# Patient Record
Sex: Male | Born: 1960 | Race: Black or African American | Hispanic: No | Smoking: Former smoker
Health system: Southern US, Community
[De-identification: ages and names within clinical notes are randomized; demographics above are authoritative.]

## PROBLEM LIST (undated history)

## (undated) DIAGNOSIS — T8859XA Other complications of anesthesia, initial encounter: Secondary | ICD-10-CM

## (undated) DIAGNOSIS — E119 Type 2 diabetes mellitus without complications: Secondary | ICD-10-CM

## (undated) DIAGNOSIS — M1990 Unspecified osteoarthritis, unspecified site: Secondary | ICD-10-CM

## (undated) DIAGNOSIS — G4730 Sleep apnea, unspecified: Secondary | ICD-10-CM

## (undated) DIAGNOSIS — G43909 Migraine, unspecified, not intractable, without status migrainosus: Secondary | ICD-10-CM

## (undated) DIAGNOSIS — G459 Transient cerebral ischemic attack, unspecified: Secondary | ICD-10-CM

## (undated) DIAGNOSIS — G8929 Other chronic pain: Secondary | ICD-10-CM

## (undated) DIAGNOSIS — K219 Gastro-esophageal reflux disease without esophagitis: Secondary | ICD-10-CM

## (undated) DIAGNOSIS — I639 Cerebral infarction, unspecified: Secondary | ICD-10-CM

## (undated) DIAGNOSIS — J45909 Unspecified asthma, uncomplicated: Secondary | ICD-10-CM

## (undated) DIAGNOSIS — E0590 Thyrotoxicosis, unspecified without thyrotoxic crisis or storm: Secondary | ICD-10-CM

## (undated) DIAGNOSIS — G40409 Other generalized epilepsy and epileptic syndromes, not intractable, without status epilepticus: Secondary | ICD-10-CM

## (undated) DIAGNOSIS — K259 Gastric ulcer, unspecified as acute or chronic, without hemorrhage or perforation: Secondary | ICD-10-CM

## (undated) DIAGNOSIS — R3914 Feeling of incomplete bladder emptying: Secondary | ICD-10-CM

## (undated) DIAGNOSIS — N50819 Testicular pain, unspecified: Secondary | ICD-10-CM

## (undated) DIAGNOSIS — G40309 Generalized idiopathic epilepsy and epileptic syndromes, not intractable, without status epilepticus: Secondary | ICD-10-CM

## (undated) DIAGNOSIS — R569 Unspecified convulsions: Secondary | ICD-10-CM

## (undated) DIAGNOSIS — F319 Bipolar disorder, unspecified: Secondary | ICD-10-CM

## (undated) DIAGNOSIS — R0600 Dyspnea, unspecified: Secondary | ICD-10-CM

## (undated) DIAGNOSIS — H40009 Preglaucoma, unspecified, unspecified eye: Secondary | ICD-10-CM

## (undated) DIAGNOSIS — H409 Unspecified glaucoma: Secondary | ICD-10-CM

## (undated) DIAGNOSIS — R350 Frequency of micturition: Secondary | ICD-10-CM

## (undated) DIAGNOSIS — I10 Essential (primary) hypertension: Secondary | ICD-10-CM

## (undated) HISTORY — PX: ABDOMINAL SURGERY: SHX537

## (undated) HISTORY — PX: TONSILLECTOMY: SUR1361

## (undated) HISTORY — PX: CARDIAC CATHETERIZATION: SHX172

## (undated) HISTORY — PX: CERVICAL FUSION: SHX112

## (undated) HISTORY — PX: TESTICLE REMOVAL: SHX68

## (undated) HISTORY — PX: SMALL BOWEL REPAIR: SHX6447

## (undated) HISTORY — PX: OTHER SURGICAL HISTORY: SHX169

## (undated) HISTORY — PX: CHOLECYSTECTOMY: SHX55

---

## 2005-02-02 ENCOUNTER — Emergency Department (HOSPITAL_COMMUNITY): Admission: EM | Admit: 2005-02-02 | Discharge: 2005-02-03 | Payer: Self-pay | Admitting: Emergency Medicine

## 2005-12-07 HISTORY — PX: ANTERIOR CERVICAL DECOMP/DISCECTOMY FUSION: SHX1161

## 2013-10-18 ENCOUNTER — Other Ambulatory Visit: Payer: Self-pay | Admitting: Urology

## 2013-11-06 ENCOUNTER — Encounter (HOSPITAL_BASED_OUTPATIENT_CLINIC_OR_DEPARTMENT_OTHER): Payer: Self-pay | Admitting: *Deleted

## 2013-11-08 ENCOUNTER — Encounter (HOSPITAL_BASED_OUTPATIENT_CLINIC_OR_DEPARTMENT_OTHER): Payer: Self-pay | Admitting: *Deleted

## 2013-11-08 NOTE — Progress Notes (Signed)
NPO AFTER MN. ARRIVE AT 1030. NEEDS ISTAT AND EKG.  PT TO CALL BACK TODAY W/ MED LIST. ALSO, TO LET ME KNOW IF HE WILL HAVE HIS SON AT DISCHARGE AND CAREGIVER AT HOME OTHERWISE HE DOES NOT HAVE ANYONE.  IF NOT WILL CALL DR [facility] FOR ADVISE.

## 2013-11-08 NOTE — H&P (Signed)
ctive Problems Problems   1. Lower abdominal pain, right (789.03)  2. Testicular pain (608.9)  History of Present Illness   Nicholas [name] returns today in f/u.  He has  a history of left testicular pain and has had a prior vasectomy and right orchiectomy.  He was given elavil but that didn't help the pain.  Today her reports recurrent condyloma that he treated with a topical agent and they fell off but he is still irritated.  The pain in the testicle is constant.   He says the pain is so severe that he can't stand to void.  He has some urgency but he has to strain to void.  He doesn't feel empty and will have to go back several times at intervals.   He has occasional dysuria.  He had some gross hematuria a week ago.  He has had that in the past.  He has some low back pain but no flank pain.  He had nephritis as a child.   Past Medical History Problems   1. History of Asthma (493.90)  2. History of Bipolar Disorder (296.00)  3. History of Cocaine abuse (305.60)  4. History of Epilepsy (345.90)  5. History of Gastric ulcer (531.90)  6. History of Gross hematuria (599.71)  7. History of condyloma acuminatum (V12.09)  8. History of diabetes mellitus (V12.29)  9. History of heartburn (V12.79)  10. History of hypercholesterolemia (V12.29)  11. History of hypertension (V12.59)  Surgical History Problems   1. History of Circumcision No Clamp/Device/Dorsal Slit Older Than 28 Days  2. History of Neck Surgery  3. History of Radical Orchiectomy Left  4. History of Shoulder Surgery  5. History of Surgery Of Male Genitalia Vasectomy  Current Meds  1. Advair Diskus 100-50 MCG/DOSE Inhalation Aerosol Powder Breath Activated;  Therapy: (Recorded:22Sep2014) to Recorded  2. Amitriptyline HCl - 25 MG Oral Tablet; TAKE 1 TABLET qHS;  Therapy: 22Sep2014 to (Last Rx:22Sep2014)  Requested for: 22Sep2014 Ordered  3. Divalproex Sodium 500 MG Oral Tablet Delayed Release;  Therapy: (Recorded:22Sep2014)  to Recorded  4. Lipitor 40 MG Oral Tablet;  Therapy: 22Sep2014 to Recorded  5. MetFORMIN HCl - 500 MG Oral Tablet;  Therapy: 22Sep2014 to Recorded  6. Topamax TABS;  Therapy: (Recorded:22Sep2014) to Recorded  Allergies Medication   1. Ampicillin CAPS  2. Penicillins  3. Tramadol  Family History Problems   1. Family history of Family Health Status Number Of Children : Brother   2 sons, 1 dtr  2. Family history of Father Deceased At Age 78 : Brother  3. Family history of Kidney Cancer (V16.51) : Mother  4. Family history of Prostate Cancer (V78.46) : Brother  Social History Problems   1. Denied: History of Alcohol Use  2. Caffeine Use   yes  3. Former smoker (V15.82)  4. Tobacco use (305.1)   17 years, quit 2001   Past, family and social history reviewed and updated.   Review of Systems Genitourinary, constitutional, skin, eye, otolaryngeal, hematologic/lymphatic, cardiovascular, pulmonary, endocrine, musculoskeletal, gastrointestinal, neurological and psychiatric system(s) were reviewed and pertinent findings if present are noted.  Genitourinary: hematuria and testicular pain.  Cardiovascular: no chest pain.  Respiratory: no shortness of breath.  Musculoskeletal: back pain.  Neurological: dizziness and tingling (in feet).    Vitals Vital Signs [Data Includes: Last 1 Day]  Recorded: 10Nov2014 08:02AM  Height: 5 ft 7 in Weight: 212 lb  BMI Calculated: 33.2 BSA Calculated: 2.07 Blood Pressure: 117 / 78 Temperature:  97.5 F Heart Rate: 90  Physical Exam Constitutional: Well nourished and well developed . No acute distress.  ENT:. The ears and nose are normal in appearance.  Neck: The appearance of the neck is normal and no neck mass is present.  Pulmonary: No respiratory distress and normal respiratory rhythm and effort.  Cardiovascular: Heart rate and rhythm are normal . No peripheral edema.  Abdomen: The abdomen is soft and nontender.  No inguinal hernia is  present on the right.  No inguinal hernia is present on the left.  Rectal: Rectal exam demonstrates normal sphincter tone, no tenderness and no masses. Estimated prostate size is 1+. The prostate has no nodularity and is not tender. The left seminal vesicle is nonpalpable. The right seminal vesicle is nonpalpable. The perineum is normal on inspection.  Genitourinary: Examination of the penis demonstrates no discharge, no masses, no lesions and a normal meatus. The scrotum is without lesions. The left epididymis is tender and found to have a spermatocele, but palpably normal. The right testis is absent. The left testis is atrophic, but non-tender and without masses.  Lymphatics: The femoral and inguinal nodes are not enlarged or tender.  Skin: Normal skin turgor, no visible rash and no visible skin lesions . No condyloma seen.  Neuro/Psych:. Mood and affect are appropriate. Normal sensation of the perineum/perianal region (S3,4,5).    Results/Data  16 Oct 2013 7:49 AM   UA With REFLEX       COLOR YELLOW       APPEARANCE CLEAR       SPECIFIC GRAVITY 1.015       pH 6.5       GLUCOSE > 1000       BILIRUBIN NEG       KETONE NEG       BLOOD NEG       PROTEIN NEG       UROBILINOGEN 0.2       NITRITE NEG       LEUKOCYTE ESTERASE NEG    The following images/tracing/specimen were independently visualized:  I have reviewed his CT and see no significant urologic findings. Full report is pending.  The following clinical lab reports were reviewed:  UA reviewed. He has glycosuria.  PVR: Ultrasound PVR 53 ml.    Assessment Assessed   1. Testicular pain (608.9)  2. History of Gross hematuria (599.71)  3. 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 Urinary frequency (788.41)   He had gross hematuria but a negative CT. He has persistent pain that appears to be localized to the left epididymis and not the testicle. He has a solitary left testicle with some atrophy.   He had condyloma but I don't see any residual lesions today.  He has frequency of urination but is emptying well.   End of Encounter Meds  Medication Name Instruction  Advair Diskus 100-50 MCG/DOSE Inhalation Aerosol Powder Breath Activated   Divalproex Sodium 500 MG Oral Tablet Delayed Release   Lipitor 40 MG Oral Tablet (Atorvastatin Calcium)   MetFORMIN HCl - 500 MG Oral Tablet   Topamax TABS (Topiramate)    Plan PMH: Gross hematuria   1. Follow-up Schedule Surgery Office  Follow-up  Status: Complete  Done: 10Nov2014  2. AU CT-HEMATURIA PROTOCOL; Status:In Progress - Specimen/Data Collected;   Done:  10Nov2014 12:00AM Testicular atrophy   3. TESTOSTERONE PANEL; Status:In Progress - Specimen/Data Collected;   Done:  10Nov2014  4. VENIPUNCTURE; Status:Complete;   Done:  10Nov2014 Testicular pain   5. Stop: Amitriptyline HCl - 25 MG Oral Tablet Urinary frequency   6. PVR U/S; Status:Complete;   Done: 10Nov2014   I am going to get him set up for outpatient cystoscopy for completion of the hematuria evaluation and a left epididymectomy to try to manage his debilitating pain while preserving his sole remaining testicle.   I have reviewed the risks of the procedure including bleeding, infection, wound complications, persistent or worsening pain, further testicular atrophy or loss with hypogonadism, need to come back for an orchiectomy if the pain remains, thrombotic events and anesthetic complications.    I will get a baseline testosterone today and if it is at a castrate level, I will discuss proceeding with a full orchiectomy with him.   Discussion/Summary   CC: Dr. Donia Guiles.

## 2013-11-09 ENCOUNTER — Encounter (HOSPITAL_BASED_OUTPATIENT_CLINIC_OR_DEPARTMENT_OTHER): Payer: Medicare Other | Admitting: Anesthesiology

## 2013-11-09 ENCOUNTER — Observation Stay (HOSPITAL_BASED_OUTPATIENT_CLINIC_OR_DEPARTMENT_OTHER)
Admission: RE | Admit: 2013-11-09 | Discharge: 2013-11-10 | Disposition: A | Discharge status: Home / Self Care | Payer: Medicare Other | Source: Ambulatory Visit | Attending: Urology | Admitting: Urology

## 2013-11-09 ENCOUNTER — Encounter (HOSPITAL_BASED_OUTPATIENT_CLINIC_OR_DEPARTMENT_OTHER): Payer: Self-pay | Admitting: *Deleted

## 2013-11-09 ENCOUNTER — Other Ambulatory Visit: Payer: Self-pay

## 2013-11-09 ENCOUNTER — Encounter (HOSPITAL_COMMUNITY)
Admission: RE | Disposition: A | Discharge status: Home / Self Care | Payer: Self-pay | Source: Ambulatory Visit | Attending: Urology

## 2013-11-09 ENCOUNTER — Ambulatory Visit (HOSPITAL_BASED_OUTPATIENT_CLINIC_OR_DEPARTMENT_OTHER): Payer: Medicare Other | Admitting: Anesthesiology

## 2013-11-09 DIAGNOSIS — R3129 Other microscopic hematuria: Secondary | ICD-10-CM | POA: Insufficient documentation

## 2013-11-09 DIAGNOSIS — M545 Low back pain, unspecified: Secondary | ICD-10-CM | POA: Insufficient documentation

## 2013-11-09 DIAGNOSIS — K219 Gastro-esophageal reflux disease without esophagitis: Secondary | ICD-10-CM | POA: Insufficient documentation

## 2013-11-09 DIAGNOSIS — Z23 Encounter for immunization: Secondary | ICD-10-CM | POA: Insufficient documentation

## 2013-11-09 DIAGNOSIS — G40909 Epilepsy, unspecified, not intractable, without status epilepticus: Secondary | ICD-10-CM | POA: Insufficient documentation

## 2013-11-09 DIAGNOSIS — N508 Other specified disorders of male genital organs: Principal | ICD-10-CM | POA: Insufficient documentation

## 2013-11-09 DIAGNOSIS — J45909 Unspecified asthma, uncomplicated: Secondary | ICD-10-CM | POA: Insufficient documentation

## 2013-11-09 DIAGNOSIS — N400 Enlarged prostate without lower urinary tract symptoms: Secondary | ICD-10-CM | POA: Insufficient documentation

## 2013-11-09 DIAGNOSIS — N51 Disorders of male genital organs in diseases classified elsewhere: Secondary | ICD-10-CM | POA: Diagnosis present

## 2013-11-09 DIAGNOSIS — N498 Inflammatory disorders of other specified male genital organs: Secondary | ICD-10-CM | POA: Insufficient documentation

## 2013-11-09 DIAGNOSIS — Z79899 Other long term (current) drug therapy: Secondary | ICD-10-CM | POA: Insufficient documentation

## 2013-11-09 DIAGNOSIS — E7800 Pure hypercholesterolemia, unspecified: Secondary | ICD-10-CM | POA: Insufficient documentation

## 2013-11-09 DIAGNOSIS — N509 Disorder of male genital organs, unspecified: Secondary | ICD-10-CM | POA: Insufficient documentation

## 2013-11-09 DIAGNOSIS — A630 Anogenital (venereal) warts: Secondary | ICD-10-CM | POA: Insufficient documentation

## 2013-11-09 DIAGNOSIS — IMO0002 Reserved for concepts with insufficient information to code with codable children: Secondary | ICD-10-CM | POA: Diagnosis present

## 2013-11-09 DIAGNOSIS — Z9852 Vasectomy status: Secondary | ICD-10-CM | POA: Insufficient documentation

## 2013-11-09 DIAGNOSIS — E291 Testicular hypofunction: Secondary | ICD-10-CM | POA: Insufficient documentation

## 2013-11-09 DIAGNOSIS — E119 Type 2 diabetes mellitus without complications: Secondary | ICD-10-CM | POA: Insufficient documentation

## 2013-11-09 DIAGNOSIS — Z9079 Acquired absence of other genital organ(s): Secondary | ICD-10-CM | POA: Insufficient documentation

## 2013-11-09 DIAGNOSIS — Z87891 Personal history of nicotine dependence: Secondary | ICD-10-CM | POA: Insufficient documentation

## 2013-11-09 DIAGNOSIS — R109 Unspecified abdominal pain: Secondary | ICD-10-CM | POA: Insufficient documentation

## 2013-11-09 DIAGNOSIS — I10 Essential (primary) hypertension: Secondary | ICD-10-CM | POA: Insufficient documentation

## 2013-11-09 DIAGNOSIS — N4340 Spermatocele of epididymis, unspecified: Secondary | ICD-10-CM | POA: Insufficient documentation

## 2013-11-09 HISTORY — DX: Testicular pain, unspecified: N50.819

## 2013-11-09 HISTORY — PX: CYSTOSCOPY: SHX5120

## 2013-11-09 HISTORY — DX: Migraine, unspecified, not intractable, without status migrainosus: G43.909

## 2013-11-09 HISTORY — DX: Gastric ulcer, unspecified as acute or chronic, without hemorrhage or perforation: K25.9

## 2013-11-09 HISTORY — DX: Preglaucoma, unspecified, unspecified eye: H40.009

## 2013-11-09 HISTORY — PX: EPIDIDYMECTOMY: SHX6275

## 2013-11-09 HISTORY — DX: Essential (primary) hypertension: I10

## 2013-11-09 HISTORY — DX: Frequency of micturition: R35.0

## 2013-11-09 HISTORY — DX: Thyrotoxicosis, unspecified without thyrotoxic crisis or storm: E05.90

## 2013-11-09 HISTORY — DX: Unspecified asthma, uncomplicated: J45.909

## 2013-11-09 HISTORY — DX: Other generalized epilepsy and epileptic syndromes, not intractable, without status epilepticus: G40.409

## 2013-11-09 HISTORY — DX: Gastro-esophageal reflux disease without esophagitis: K21.9

## 2013-11-09 HISTORY — DX: Type 2 diabetes mellitus without complications: E11.9

## 2013-11-09 HISTORY — DX: Feeling of incomplete bladder emptying: R39.14

## 2013-11-09 HISTORY — DX: Generalized idiopathic epilepsy and epileptic syndromes, not intractable, without status epilepticus: G40.309

## 2013-11-09 LAB — POCT I-STAT 4, (NA,K, GLUC, HGB,HCT)
Glucose, Bld: 112 mg/dL — ABNORMAL HIGH (ref 70–99)
HCT: 41 % (ref 39.0–52.0)
Hemoglobin: 13.9 g/dL (ref 13.0–17.0)
Potassium: 3.5 mEq/L (ref 3.5–5.1)
Sodium: 147 mEq/L — ABNORMAL HIGH (ref 135–145)

## 2013-11-09 LAB — GLUCOSE, CAPILLARY
Glucose-Capillary: 113 mg/dL — ABNORMAL HIGH (ref 70–99)
Glucose-Capillary: 154 mg/dL — ABNORMAL HIGH (ref 70–99)
Glucose-Capillary: 157 mg/dL — ABNORMAL HIGH (ref 70–99)

## 2013-11-09 SURGERY — EPIDIDYMECTOMY
Anesthesia: General | Site: Scrotum

## 2013-11-09 MED ORDER — ONDANSETRON HCL 4 MG/2ML IJ SOLN: 4.0000 mg | INTRAMUSCULAR | Status: DC | PRN

## 2013-11-09 MED ORDER — DIVALPROEX SODIUM 500 MG PO DR TAB
1000.0000 mg | DELAYED_RELEASE_TABLET | Freq: Every day | ORAL | Status: DC
  Administered 2013-11-09: 1000 mg via ORAL
  Filled 2013-11-09 (×2): qty 2

## 2013-11-09 MED ORDER — TAMSULOSIN HCL 0.4 MG PO CAPS
0.4000 mg | ORAL_CAPSULE | Freq: Every day | ORAL | Status: DC
  Administered 2013-11-09 – 2013-11-10 (×2): 0.4 mg via ORAL
  Filled 2013-11-09 (×2): qty 1

## 2013-11-09 MED ORDER — FENTANYL CITRATE 0.05 MG/ML IJ SOLN
25.0000 ug | INTRAMUSCULAR | Status: DC | PRN
  Filled 2013-11-09: qty 1

## 2013-11-09 MED ORDER — FENTANYL CITRATE 0.05 MG/ML IJ SOLN
INTRAMUSCULAR | Status: AC
  Filled 2013-11-09: qty 6

## 2013-11-09 MED ORDER — POTASSIUM CHLORIDE IN NACL 20-0.45 MEQ/L-% IV SOLN
INTRAVENOUS | Status: DC
  Administered 2013-11-09 – 2013-11-10 (×2): via INTRAVENOUS
  Filled 2013-11-09 (×4): qty 1000

## 2013-11-09 MED ORDER — INSULIN ASPART 100 UNIT/ML ~~LOC~~ SOLN
0.0000 [IU] | Freq: Three times a day (TID) | SUBCUTANEOUS | Status: DC
  Administered 2013-11-09: 3 [IU] via SUBCUTANEOUS
  Administered 2013-11-10 (×2): 2 [IU] via SUBCUTANEOUS

## 2013-11-09 MED ORDER — PANTOPRAZOLE SODIUM 40 MG PO TBEC
40.0000 mg | DELAYED_RELEASE_TABLET | Freq: Every day | ORAL | Status: DC
  Administered 2013-11-09 – 2013-11-10 (×2): 40 mg via ORAL
  Filled 2013-11-09 (×2): qty 1

## 2013-11-09 MED ORDER — FENTANYL CITRATE 0.05 MG/ML IJ SOLN
INTRAMUSCULAR | Status: DC | PRN
  Administered 2013-11-09 (×2): 50 ug via INTRAVENOUS

## 2013-11-09 MED ORDER — METFORMIN HCL 500 MG PO TABS
500.0000 mg | ORAL_TABLET | Freq: Two times a day (BID) | ORAL | Status: DC
  Administered 2013-11-09 – 2013-11-10 (×2): 500 mg via ORAL
  Filled 2013-11-09 (×4): qty 1

## 2013-11-09 MED ORDER — LURASIDONE HCL 40 MG PO TABS
20.0000 mg | ORAL_TABLET | Freq: Every day | ORAL | Status: DC
  Administered 2013-11-10: 20 mg via ORAL
  Filled 2013-11-09 (×2): qty 1

## 2013-11-09 MED ORDER — ZOLPIDEM TARTRATE 5 MG PO TABS: 5.0000 mg | ORAL_TABLET | Freq: Every evening | ORAL | Status: DC | PRN

## 2013-11-09 MED ORDER — RISPERIDONE 2 MG PO TABS: 2.0000 mg | ORAL_TABLET | Freq: Every day | ORAL | Status: DC

## 2013-11-09 MED ORDER — INFLUENZA VAC SPLIT QUAD 0.5 ML IM SUSP
0.5000 mL | INTRAMUSCULAR | Status: AC
  Administered 2013-11-10: 0.5 mL via INTRAMUSCULAR
  Filled 2013-11-09 (×2): qty 0.5

## 2013-11-09 MED ORDER — CIPROFLOXACIN IN D5W 400 MG/200ML IV SOLN
INTRAVENOUS | Status: AC
  Filled 2013-11-09: qty 200

## 2013-11-09 MED ORDER — ALBUTEROL SULFATE HFA 108 (90 BASE) MCG/ACT IN AERS: 1.0000 | INHALATION_SPRAY | Freq: Four times a day (QID) | RESPIRATORY_TRACT | Status: DC | PRN

## 2013-11-09 MED ORDER — LACTATED RINGERS IV SOLN
INTRAVENOUS | Status: DC
  Filled 2013-11-09: qty 1000

## 2013-11-09 MED ORDER — OXYCODONE-ACETAMINOPHEN 5-325 MG PO TABS: 1.0000 | ORAL_TABLET | ORAL | Status: DC | PRN

## 2013-11-09 MED ORDER — PNEUMOCOCCAL VAC POLYVALENT 25 MCG/0.5ML IJ INJ
0.5000 mL | INJECTION | INTRAMUSCULAR | Status: AC
  Administered 2013-11-10: 0.5 mL via INTRAMUSCULAR
  Filled 2013-11-09 (×2): qty 0.5

## 2013-11-09 MED ORDER — PROPOFOL 10 MG/ML IV BOLUS
INTRAVENOUS | Status: DC | PRN
  Administered 2013-11-09: 200 mg via INTRAVENOUS

## 2013-11-09 MED ORDER — ONDANSETRON HCL 4 MG/2ML IJ SOLN
INTRAMUSCULAR | Status: DC | PRN
  Administered 2013-11-09: 4 mg via INTRAVENOUS

## 2013-11-09 MED ORDER — METFORMIN HCL 500 MG PO TABS: 1000.0000 mg | ORAL_TABLET | Freq: Two times a day (BID) | ORAL | Status: DC

## 2013-11-09 MED ORDER — ATENOLOL 25 MG PO TABS
25.0000 mg | ORAL_TABLET | Freq: Every day | ORAL | Status: DC
  Administered 2013-11-09 – 2013-11-10 (×2): 25 mg via ORAL
  Filled 2013-11-09 (×2): qty 1

## 2013-11-09 MED ORDER — DIVALPROEX SODIUM 500 MG PO DR TAB
500.0000 mg | DELAYED_RELEASE_TABLET | Freq: Every morning | ORAL | Status: DC
  Administered 2013-11-10: 500 mg via ORAL
  Filled 2013-11-09: qty 1

## 2013-11-09 MED ORDER — LACTATED RINGERS IV SOLN
INTRAVENOUS | Status: DC | PRN
  Administered 2013-11-09: 11:00:00 via INTRAVENOUS

## 2013-11-09 MED ORDER — CIPROFLOXACIN IN D5W 400 MG/200ML IV SOLN
400.0000 mg | INTRAVENOUS | Status: AC
  Administered 2013-11-09: 400 mg via INTRAVENOUS
  Filled 2013-11-09: qty 200

## 2013-11-09 MED ORDER — HYDROMORPHONE HCL PF 1 MG/ML IJ SOLN
0.5000 mg | INTRAMUSCULAR | Status: DC | PRN
  Administered 2013-11-09: 1 mg via INTRAVENOUS
  Administered 2013-11-09: 0.5 mg via INTRAVENOUS
  Administered 2013-11-09 – 2013-11-10 (×2): 1 mg via INTRAVENOUS
  Filled 2013-11-09 (×4): qty 1

## 2013-11-09 MED ORDER — TOPIRAMATE 100 MG PO TABS
300.0000 mg | ORAL_TABLET | Freq: Every evening | ORAL | Status: DC
  Administered 2013-11-09: 300 mg via ORAL
  Filled 2013-11-09 (×2): qty 3

## 2013-11-09 MED ORDER — MIDAZOLAM HCL 5 MG/5ML IJ SOLN
INTRAMUSCULAR | Status: DC | PRN
  Administered 2013-11-09 (×2): 1 mg via INTRAVENOUS

## 2013-11-09 MED ORDER — LISINOPRIL 40 MG PO TABS
40.0000 mg | ORAL_TABLET | Freq: Every day | ORAL | Status: DC
  Administered 2013-11-09 – 2013-11-10 (×2): 40 mg via ORAL
  Filled 2013-11-09 (×2): qty 1

## 2013-11-09 MED ORDER — LIDOCAINE HCL (CARDIAC) 20 MG/ML IV SOLN
INTRAVENOUS | Status: DC | PRN
  Administered 2013-11-09: 100 mg via INTRAVENOUS

## 2013-11-09 MED ORDER — LACTATED RINGERS IV SOLN
INTRAVENOUS | Status: DC
  Administered 2013-11-09: 11:00:00 via INTRAVENOUS
  Filled 2013-11-09: qty 1000

## 2013-11-09 MED ORDER — MOMETASONE FURO-FORMOTEROL FUM 100-5 MCG/ACT IN AERO
2.0000 | INHALATION_SPRAY | Freq: Two times a day (BID) | RESPIRATORY_TRACT | Status: DC
  Administered 2013-11-09 – 2013-11-10 (×2): 2 via RESPIRATORY_TRACT
  Filled 2013-11-09: qty 8.8

## 2013-11-09 MED ORDER — BISACODYL 10 MG RE SUPP: 10.0000 mg | Freq: Every day | RECTAL | Status: DC | PRN

## 2013-11-09 MED ORDER — ACETAMINOPHEN 325 MG PO TABS: 650.0000 mg | ORAL_TABLET | ORAL | Status: DC | PRN

## 2013-11-09 MED ORDER — METFORMIN HCL 500 MG PO TABS
1000.0000 mg | ORAL_TABLET | Freq: Two times a day (BID) | ORAL | Status: DC
  Filled 2013-11-09 (×2): qty 2

## 2013-11-09 MED ORDER — BUPIVACAINE HCL (PF) 0.25 % IJ SOLN
INTRAMUSCULAR | Status: DC | PRN
  Administered 2013-11-09: 17 mL

## 2013-11-09 MED ORDER — MIDAZOLAM HCL 2 MG/2ML IJ SOLN
INTRAMUSCULAR | Status: AC
  Filled 2013-11-09: qty 2

## 2013-11-09 MED ORDER — OXYCODONE HCL 5 MG PO TABS
5.0000 mg | ORAL_TABLET | ORAL | Status: DC | PRN
  Administered 2013-11-10 (×2): 5 mg via ORAL
  Filled 2013-11-09 (×2): qty 1

## 2013-11-09 SURGICAL SUPPLY — 37 items
APPLICATOR COTTON TIP 6IN STRL (MISCELLANEOUS) ×3 IMPLANT
BANDAGE GAUZE ELAST BULKY 4 IN (GAUZE/BANDAGES/DRESSINGS) ×3 IMPLANT
BLADE SURG 15 STRL LF DISP TIS (BLADE) ×2 IMPLANT
BLADE SURG 15 STRL SS (BLADE) ×1
BNDG GAUZE ELAST 4 BULKY (GAUZE/BANDAGES/DRESSINGS) ×3 IMPLANT
CANISTER SUCTION 1200CC (MISCELLANEOUS) IMPLANT
CANISTER SUCTION 2500CC (MISCELLANEOUS) IMPLANT
CATH ROBINSON RED A/P 16FR (CATHETERS) ×3 IMPLANT
CLEANER CAUTERY TIP 5X5 PAD (MISCELLANEOUS) ×2 IMPLANT
CLOTH BEACON ORANGE TIMEOUT ST (SAFETY) ×3 IMPLANT
COVER MAYO STAND STRL (DRAPES) ×3 IMPLANT
COVER TABLE BACK 60X90 (DRAPES) ×3 IMPLANT
DISSECTOR ROUND CHERRY 3/8 STR (MISCELLANEOUS) IMPLANT
DRAIN PENROSE 18X1/4 LTX STRL (WOUND CARE) IMPLANT
DRAPE PED LAPAROTOMY (DRAPES) ×3 IMPLANT
ELECT REM PT RETURN 9FT ADLT (ELECTROSURGICAL) ×3
ELECTRODE REM PT RTRN 9FT ADLT (ELECTROSURGICAL) ×2 IMPLANT
GLOVE BIOGEL PI IND STRL 7.5 (GLOVE) ×2 IMPLANT
GLOVE BIOGEL PI INDICATOR 7.5 (GLOVE) ×1
GLOVE SURG SS PI 8.0 STRL IVOR (GLOVE) ×6 IMPLANT
GOWN W/2 COTTON TOWELS 2 STD (GOWNS) ×6 IMPLANT
NEEDLE HYPO 22GX1.5 SAFETY (NEEDLE) ×3 IMPLANT
NS IRRIG 500ML POUR BTL (IV SOLUTION) IMPLANT
PACK BASIN DAY SURGERY FS (CUSTOM PROCEDURE TRAY) ×3 IMPLANT
PAD CLEANER CAUTERY TIP 5X5 (MISCELLANEOUS) ×1
PENCIL BUTTON HOLSTER BLD 10FT (ELECTRODE) ×3 IMPLANT
SET IRRIG Y TYPE TUR BLADDER L (SET/KITS/TRAYS/PACK) ×3 IMPLANT
SPONGE GAUZE 4X4 12PLY (GAUZE/BANDAGES/DRESSINGS) ×3 IMPLANT
SUPPORT SCROTAL LG STRP (MISCELLANEOUS) ×3 IMPLANT
SUT CHROMIC 3 0 SH 27 (SUTURE) ×3 IMPLANT
SUT VICRYL 0 TIES 12 18 (SUTURE) IMPLANT
SYR BULB IRRIGATION 50ML (SYRINGE) IMPLANT
SYR CONTROL 10ML LL (SYRINGE) ×3 IMPLANT
TRAY DSU PREP LF (CUSTOM PROCEDURE TRAY) ×3 IMPLANT
TUBE CONNECTING 12X1/4 (SUCTIONS) IMPLANT
WATER STERILE IRR 3000ML UROMA (IV SOLUTION) ×3 IMPLANT
YANKAUER SUCT BULB TIP NO VENT (SUCTIONS) IMPLANT

## 2013-11-09 NOTE — Interval H&P Note (Signed)
History and Physical Interval Note:  11/09/2013 11:17 AM  Nicholas [name]  has presented today for surgery, with the diagnosis of LEFT EPIDIDYMAL PAIN/HEMATURIA  The various methods of treatment have been discussed with the patient and family. After consideration of risks, benefits and other options for treatment, the patient has consented to  Procedure(s) with comments: CYSTO LEFT EPIDIDYMECTOMY (Left) - POSSIBLE OUTPATIENT WITH OBSERVATION as a surgical intervention .  The patient's history has been reviewed, patient examined, no change in status, stable for surgery.  I have reviewed the patient's chart and labs.  Questions were answered to the patient's satisfaction.     Dionisios Ricci J

## 2013-11-09 NOTE — Transfer of Care (Signed)
Immediate Anesthesia Transfer of Care Note  Patient: Nicholas [name]  Procedure(s) Performed: Procedure(s) (LRB):  LEFT EPIDIDYMECTOMY (Left) CYSTOSCOPY FLEXIBLE (N/A)  Patient Location: PACU  Anesthesia Type: General  Level of Consciousness: awake, oriented, sedated and patient cooperative  Airway & Oxygen Therapy: Patient Spontanous Breathing and Patient connected to face mask oxygen  Post-op Assessment: Report given to PACU RN and Post -op Vital signs reviewed and stable  Post vital signs: Reviewed and stable  Complications: No apparent anesthesia complications

## 2013-11-09 NOTE — Progress Notes (Signed)
Pt voided 350cc, bladder scan showed only 75cc PVR. Julio Sicks RN

## 2013-11-09 NOTE — Brief Op Note (Signed)
11/09/2013  1:00 PM  PATIENT:  Nicholas [name]  52 y.o. male  PRE-OPERATIVE DIAGNOSIS:  LEFT EPIDIDYMAL PAIN/HEMATURIA/Scrotal lesion  POST-OPERATIVE DIAGNOSIS:  LEFT EPIDIDYMAL PAIN/HEMATURIA/Scrotal lesion  PROCEDURE:  Procedure(s) with comments:  LEFT EPIDIDYMECTOMY (Left) - POSSIBLE OUTPATIENT WITH OBSERVATION CYSTOSCOPY FLEXIBLE (N/A) Scrotal biopsy.   SURGEON:  Surgeon(s) and Role:    * Bjorn Pippin, MD - Primary  PHYSICIAN ASSISTANT:   ASSISTANTS: none   ANESTHESIA:   local and general  EBL:  Total I/O In: 100 [I.V.:100] Out: -   BLOOD ADMINISTERED:none  DRAINS: none   LOCAL MEDICATIONS USED:  MARCAINE    and Amount: 15 ml 0.25% plain  SPECIMEN:  Source of Specimen:  left epididymis and scrotal lesions.  DISPOSITION OF SPECIMEN:  PATHOLOGY  COUNTS:  YES  TOURNIQUET:  * No tourniquets in log *  DICTATION: .Other Dictation: Dictation Number [id number]  PLAN OF CARE: Admit for overnight observation  PATIENT DISPOSITION:  PACU - hemodynamically stable.   Delay start of Pharmacological VTE agent (>24hrs) due to surgical blood loss or risk of bleeding: not applicable

## 2013-11-09 NOTE — Anesthesia Postprocedure Evaluation (Signed)
  Anesthesia Post-op Note  Patient: Nicholas [name]  Procedure(s) Performed: Procedure(s) (LRB):  LEFT EPIDIDYMECTOMY (Left) CYSTOSCOPY FLEXIBLE (N/A)  Patient Location: PACU  Anesthesia Type: General  Level of Consciousness: awake and alert   Airway and Oxygen Therapy: Patient Spontanous Breathing  Post-op Pain: mild  Post-op Assessment: Post-op Vital signs reviewed, Patient's Cardiovascular Status Stable, Respiratory Function Stable, Patent Airway and No signs of Nausea or vomiting  Last Vitals:  Filed Vitals:   11/09/13 1313  BP:   Pulse:   Temp: 37 C  Resp:     Post-op Vital Signs: stable   Complications: No apparent anesthesia complications

## 2013-11-09 NOTE — Progress Notes (Signed)
Patient ID: Nicholas [name], male   DOB: 09/18/1961, 53 y.o.   MRN: [id number]  [name] is have some surgical pain but his main complaint is the inability to void.   I emptied his bladder in the OR but he hasn't voided since.   I will order a bladder scan and give him tamsulosin.  If his PVR is >250cc, I will have him CIC'd.

## 2013-11-09 NOTE — H&P (View-Only) (Signed)
NPO AFTER MN. ARRIVE AT 1030. NEEDS ISTAT AND EKG.  PT TO CALL BACK TODAY W/ MED LIST. ALSO, TO LET ME KNOW IF HE WILL HAVE HIS SON AT DISCHARGE AND CAREGIVER AT HOME OTHERWISE HE DOES NOT HAVE ANYONE.  IF NOT WILL CALL DR WRENN FOR ADVISE. 

## 2013-11-09 NOTE — Anesthesia Procedure Notes (Signed)
Procedure Name: LMA Insertion Date/Time: 11/09/2013 12:17 PM Performed by: Jessica Priest Pre-anesthesia Checklist: Patient identified, Emergency Drugs available, Suction available and Patient being monitored Patient Re-evaluated:Patient Re-evaluated prior to inductionOxygen Delivery Method: Circle System Utilized Preoxygenation: Pre-oxygenation with 100% oxygen Intubation Type: IV induction Ventilation: Mask ventilation without difficulty LMA: LMA inserted LMA Size: 4.0 Number of attempts: 1 Airway Equipment and Method: bite block Placement Confirmation: positive ETCO2 Tube secured with: Tape Dental Injury: Teeth and Oropharynx as per pre-operative assessment

## 2013-11-09 NOTE — Anesthesia Preprocedure Evaluation (Addendum)
Anesthesia Evaluation  Patient identified by MRN, date of birth, ID band Patient awake    Reviewed: Allergy & Precautions, H&P , NPO status , Patient's Chart, lab work & pertinent test results  Airway Mallampati: III TM Distance: >3 FB Neck ROM: full    Dental no notable dental hx. (+) Teeth Intact and Dental Advisory Given   Pulmonary asthma , former smoker,  breath sounds clear to auscultation  Pulmonary exam normal       Cardiovascular Exercise Tolerance: Good hypertension, Pt. on home beta blockers Rhythm:regular Rate:Normal     Neuro/Psych  Headaches, Seizures -, Poorly Controlled,  Grand mal. Last seiuzure 1 week ago. Has seizure 2 to 3 times per month. Cervical fusion negative neurological ROS  negative psych ROS   GI/Hepatic negative GI ROS, Neg liver ROS, GERD-  Medicated and Controlled,  Endo/Other  diabetes, Well Controlled, Type 2, Oral Hypoglycemic AgentsHyperthyroidism No meds for hyperthryroidism.  Could not obtain good history on this from patient.  Renal/GU negative Renal ROS  negative genitourinary   Musculoskeletal   Abdominal   Peds  Hematology negative hematology ROS (+)   Anesthesia Other Findings   Reproductive/Obstetrics negative OB ROS                          Anesthesia Physical Anesthesia Plan  ASA: III  Anesthesia Plan: General   Post-op Pain Management:    Induction: Intravenous  Airway Management Planned: LMA  Additional Equipment:   Intra-op Plan:   Post-operative Plan:   Informed Consent: I have reviewed the patients History and Physical, chart, labs and discussed the procedure including the risks, benefits and alternatives for the proposed anesthesia with the patient or authorized representative who has indicated his/her understanding and acceptance.   Dental Advisory Given  Plan Discussed with: CRNA and Surgeon  Anesthesia Plan Comments:          Anesthesia Quick Evaluation

## 2013-11-10 ENCOUNTER — Encounter (HOSPITAL_BASED_OUTPATIENT_CLINIC_OR_DEPARTMENT_OTHER): Payer: Self-pay | Admitting: Urology

## 2013-11-10 LAB — GLUCOSE, CAPILLARY
Glucose-Capillary: 127 mg/dL — ABNORMAL HIGH (ref 70–99)
Glucose-Capillary: 123 mg/dL — ABNORMAL HIGH (ref 70–99)

## 2013-11-10 MED ORDER — OXYCODONE HCL 5 MG PO TABS: 5.0000 mg | ORAL_TABLET | ORAL | Status: DC | PRN

## 2013-11-10 NOTE — Discharge Summary (Signed)
Physician Discharge Summary  Patient ID: Nicholas [name] MRN: [id number] DOB/AGE: July 01, 1961 52 y.o.  Admit date: 11/09/2013 Discharge date: 11/10/2013  Admission Diagnoses:  Testicular/scrotal pain  Discharge Diagnoses:  Principal Problem:   Testicular/scrotal pain Active Problems:   Microhematuria   Condyloma acuminatum of scrotum   Past Medical History  Diagnosis Date  . Epididymal pain     LEFT  . Hypertension   . Asthma   . 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 Migraine   . Type 2 diabetes mellitus   . Hyperthyroidism     NO MEDS   . GERD (gastroesophageal reflux disease)   . Gastric ulcer   . Frequency of urination   . Feeling of incomplete bladder emptying   . Borderline glaucoma     Surgeries: Procedure(s):  LEFT EPIDIDYMECTOMY CYSTOSCOPY FLEXIBLE on 11/09/2013 SCROTAL BIOPSY   Consultants (if any):    Discharged Condition: Improved  Hospital Course: Nicholas [name] is an 52 y.o. male who was admitted 11/09/2013 with a diagnosis of Testicular/scrotal pain and went to the operating room on 11/09/2013 and underwent the above named procedures.  The cystoscopy was negative.   He has done well post op but does have expected post op pain.  He is voiding well and is felt to be ready for discharge today.   He was given perioperative antibiotics:  Anti-infectives   Start     Dose/Rate Route Frequency Ordered Stop   11/09/13 1019  ciprofloxacin (CIPRO) IVPB 400 mg     400 mg 200 mL/hr over 60 Minutes Intravenous 60 min pre-op 11/09/13 1020 11/09/13 1221    .  He was given sequential compression devices and early ambulation for DVT prophylaxis.  He benefited maximally from the hospital stay and there were no complications.    Recent vital signs:  Filed Vitals:   11/10/13 0454  BP: 102/50  Pulse: 71  Temp: 98.4 F (36.9 C)  Resp: 18    Recent laboratory studies:   Lab Results  Component Value Date   HGB 13.9 11/09/2013   No results found for this basename: WBC, PLT   No results found for this basename: INR   Lab Results  Component Value Date   NA 147* 11/09/2013   K 3.5 11/09/2013   GLUCOSE 112* 11/09/2013    Discharge Medications:     Medication List         atenolol 25 MG tablet  Commonly known as:  TENORMIN  Take 25 mg by mouth daily.     divalproex 500 MG DR tablet  Commonly known as:  DEPAKOTE  - Take 500-1,000 mg by mouth 2 (two) times daily. 500mg  in the AM.  - 1000mg  in the pm.     eszopiclone 2 MG Tabs tablet  Commonly known as:  LUNESTA  Take 2 mg by mouth at bedtime as needed for sleep. Take immediately before bedtime     fluticasone-salmeterol 115-21 MCG/ACT inhaler  Commonly known as:  ADVAIR HFA  Inhale 2 puffs into the lungs daily.     lisinopril 40 MG tablet  Commonly known as:  PRINIVIL,ZESTRIL  Take 40 mg by mouth daily.     lurasidone 40 MG Tabs tablet  Commonly known as:  LATUDA  Take 20-40 mg by mouth every evening.     metFORMIN 500 MG tablet  Commonly known  as:  GLUCOPHAGE  Take 500 mg by mouth 2 (two) times daily with a meal.     omeprazole 40 MG capsule  Commonly known as:  PRILOSEC  Take 40 mg by mouth daily.     oxyCODONE 5 MG immediate release tablet  Commonly known as:  ROXICODONE  Take 1 tablet (5 mg total) by mouth every 4 (four) hours as needed for severe pain.     oxyCODONE-acetaminophen 5-325 MG per tablet  Commonly known as:  ROXICET  Take 1 tablet by mouth every 4 (four) hours as needed for severe pain.     topiramate 100 MG tablet  Commonly known as:  TOPAMAX  Take 300 mg by mouth every evening.        Diagnostic Studies: No results found.  Disposition: Final discharge disposition not confirmed        Follow-up Information   Follow up with Anner Crete, MD On 11/16/2013. [phone])    Specialty:  Urology   Contact information:   [address]  [place] [address] [phone]        Signed: Anner Crete 11/10/2013, 7:42 AM

## 2013-11-10 NOTE — Op Note (Signed)
NAMERAMONA, SLINGER NO.:  [id number]  MEDICAL RECORD NO.:  [id number]  LOCATION:  1436                         FACILITY:  [facility]  PHYSICIAN:  Excell Seltzer. Annabell Howells, M.D.    DATE OF BIRTH:  12-21-1960  DATE OF PROCEDURE:  11/09/2013 DATE OF DISCHARGE:                              OPERATIVE REPORT   PROCEDURE: 1. Cystoscopy. 2. Biopsy and fulguration of 2 scrotal lesions. 3. Left epididymectomy.  PREOPERATIVE DIAGNOSES: 1. Microhematuria. 2. Scrotal lesions, probable condyloma. 3. Left epididymal pain with hypogonadism.  POSTOPERATIVE DIAGNOSES: 1. Microhematuria. 2. Scrotal lesions, probable condyloma. 3. Left epididymal pain with hypogonadism.  SURGEON:  Excell Seltzer. Annabell Howells, M.D.  ANESTHESIA:  General with Marcaine, local.  BLOOD LOSS:  Minimal.  SPECIMEN:  Left epididymis and scrotal lesions.  COMPLICATIONS:  None.  INDICATIONS:  Mr. Spieker is a 52 year old, African American male, who presented with left scrotal pain, which I felt it was localizable to the epididymis.  He has had a prior right orchiectomy and is hypogonadal, so it was decided that the most appropriate option would be to proceed with a left epididymectomy although with the understanding that this pain is not controlled with this procedure.  Completion of orchiectomy may be required.  In the holding area, he expressed concern about a small lesion on the scrotum that was tender on inspection and had findings consistent of condyloma which he apparently has had before.  He desired to have this excised.  FINDINGS OF PROCEDURE:  He had been given Cipro.  He was taken to the operating room where general anesthetic was induced.  He was placed in a supine position and fitted with PAS hose.  His genitalia was clipped and he was prepped with Betadine solution and draped in usual sterile fashion.  Cystoscopy was then performed with a 16-French flexible cystoscope. Inspection revealed a normal urethra.   The external sphincter was intact.  The prostatic urethra was short with bilobar hyperplasia without significant obstruction.  Examination of bladder revealed a smooth wall with only mild trabeculation.  No tumors, stones, or inflammation were noted.  Ureteral orifices were unremarkable.  Once cystoscopy was performed, the bladder was drained with a 14-French red rubber catheter.  The gloves were changed.  The scrotal lesions were then identified.  There were actually 2 within about 2 cm one and other about 2-3 mm in size.  They were fragile and broke easily with an attempt to pick them up with the Adson pickups, however, I was able to use scissors to snip the lesions at the base.  The sites of the lesions were then fulgurated with the Bugbee since they were quite small.  At this point, a left anterior oblique scrotal incision was made with a knife.  This was carried down through the dartos and tunica vaginalis with a Bovie and the testicle was delivered from the wound.  The testicle was small but otherwise unremarkable in appearance as was the epididymis.  His prior vasectomy site was identified.  The tunica vaginalis overlying the epididymis was then incised around the epididymis which was then dissected off the testicle with care being taken to spare the testicular blood  supply.  The Reedy testis was isolated, clamped, and the epididymis was removed from the upper pole of the testicle.  The Reedy testis was then ligated with a 0 Vicryl tie and oversewn with a 3-0 Vicryl.  An appendix testis in this area was fulgurated.  The vascular pedicle was then dissected out and clamped with a hemostat and divided and then doubly ligated with 0 silk ties. The epididymis was removed in completion using the Bovie and blunt dissection.  Additionally, there was a small gubernacular attachment to the lower pole of the testicle that was divided to aid exposure.  There was a little bit of venous  bleeding in this area which was controlled with a tie as well.  At this point, a cord block was performed with 8 mL of 0.25% Marcaine without epinephrine, and the testicle was inspected.  No active bleeding was noted.  It was then returned to the left hemiscrotum.  The dartos was closed using a running 3-0 chromic.  The skin was closed with interrupted vertical mattress, 3-0 chromic sutures.  Additional Marcaine was injected into the edges of the incision before final closure.  A total of approximately 15 mL of 0.25% Marcaine were used.  At this point, the wound was cleansed.  Dressing of 4 x 4s, fluff, Kerlix, and scrotal support were placed.  The patient's anesthetic was reversed.  He was moved to recovery room in stable condition.  There were no complications.     Excell Seltzer. Annabell Howells, M.D.     JJW/MEDQ  D:  11/09/2013  T:  11/10/2013  Job:  [id number]

## 2013-11-19 ENCOUNTER — Encounter (HOSPITAL_COMMUNITY): Payer: Self-pay | Admitting: Emergency Medicine

## 2013-11-19 ENCOUNTER — Emergency Department (HOSPITAL_COMMUNITY)
Admission: EM | Admit: 2013-11-19 | Discharge: 2013-11-20 | Disposition: A | Discharge status: Home / Self Care | Payer: Medicare Other | Attending: Emergency Medicine | Admitting: Emergency Medicine

## 2013-11-19 DIAGNOSIS — IMO0002 Reserved for concepts with insufficient information to code with codable children: Secondary | ICD-10-CM | POA: Insufficient documentation

## 2013-11-19 DIAGNOSIS — K219 Gastro-esophageal reflux disease without esophagitis: Secondary | ICD-10-CM | POA: Insufficient documentation

## 2013-11-19 DIAGNOSIS — J45909 Unspecified asthma, uncomplicated: Secondary | ICD-10-CM | POA: Insufficient documentation

## 2013-11-19 DIAGNOSIS — N498 Inflammatory disorders of other specified male genital organs: Secondary | ICD-10-CM | POA: Insufficient documentation

## 2013-11-19 DIAGNOSIS — N492 Inflammatory disorders of scrotum: Secondary | ICD-10-CM

## 2013-11-19 DIAGNOSIS — N433 Hydrocele, unspecified: Secondary | ICD-10-CM

## 2013-11-19 DIAGNOSIS — E119 Type 2 diabetes mellitus without complications: Secondary | ICD-10-CM | POA: Insufficient documentation

## 2013-11-19 DIAGNOSIS — T8140XA Infection following a procedure, unspecified, initial encounter: Secondary | ICD-10-CM | POA: Insufficient documentation

## 2013-11-19 DIAGNOSIS — Z87891 Personal history of nicotine dependence: Secondary | ICD-10-CM | POA: Insufficient documentation

## 2013-11-19 DIAGNOSIS — Y838 Other surgical procedures as the cause of abnormal reaction of the patient, or of later complication, without mention of misadventure at the time of the procedure: Secondary | ICD-10-CM | POA: Insufficient documentation

## 2013-11-19 DIAGNOSIS — Z880 Allergy status to penicillin: Secondary | ICD-10-CM | POA: Insufficient documentation

## 2013-11-19 DIAGNOSIS — G43909 Migraine, unspecified, not intractable, without status migrainosus: Secondary | ICD-10-CM | POA: Insufficient documentation

## 2013-11-19 DIAGNOSIS — G40909 Epilepsy, unspecified, not intractable, without status epilepticus: Secondary | ICD-10-CM | POA: Insufficient documentation

## 2013-11-19 DIAGNOSIS — Z79899 Other long term (current) drug therapy: Secondary | ICD-10-CM | POA: Insufficient documentation

## 2013-11-19 DIAGNOSIS — I10 Essential (primary) hypertension: Secondary | ICD-10-CM | POA: Insufficient documentation

## 2013-11-19 DIAGNOSIS — Z9089 Acquired absence of other organs: Secondary | ICD-10-CM | POA: Insufficient documentation

## 2013-11-19 DIAGNOSIS — F319 Bipolar disorder, unspecified: Secondary | ICD-10-CM | POA: Insufficient documentation

## 2013-11-19 HISTORY — DX: Bipolar disorder, unspecified: F31.9

## 2013-11-19 NOTE — ED Notes (Signed)
Pt said wound dehisced on 12/12. Pt got out of the shower today and began bleeding from the surgical site more than previously. Pt endorses some abdominal pain and discharge from penis

## 2013-11-19 NOTE — ED Notes (Signed)
Bed: WA04 Expected date:  Expected time:  Means of arrival:  Comments: EMS 52yo M surgical complication - bleeding surgery on 12/4

## 2013-11-20 ENCOUNTER — Emergency Department (HOSPITAL_COMMUNITY): Payer: Medicare Other

## 2013-11-20 LAB — URINALYSIS, ROUTINE W REFLEX MICROSCOPIC
Bilirubin Urine: NEGATIVE
Glucose, UA: 250 mg/dL — AB
Hgb urine dipstick: NEGATIVE
Ketones, ur: NEGATIVE mg/dL
Leukocytes, UA: NEGATIVE
Nitrite: NEGATIVE
Protein, ur: NEGATIVE mg/dL
Specific Gravity, Urine: 1.021 (ref 1.005–1.030)
Urobilinogen, UA: 0.2 mg/dL (ref 0.0–1.0)
pH: 7 (ref 5.0–8.0)

## 2013-11-20 MED ORDER — DOXYCYCLINE HYCLATE 100 MG PO TABS
100.0000 mg | ORAL_TABLET | Freq: Once | ORAL | Status: AC
  Administered 2013-11-20: 100 mg via ORAL
  Filled 2013-11-20: qty 1

## 2013-11-20 MED ORDER — DOXYCYCLINE HYCLATE 100 MG PO CAPS: 100.0000 mg | ORAL_CAPSULE | Freq: Two times a day (BID) | ORAL | Status: DC

## 2013-11-20 MED ORDER — OXYCODONE-ACETAMINOPHEN 5-325 MG PO TABS: 1.0000 | ORAL_TABLET | Freq: Four times a day (QID) | ORAL | Status: DC | PRN

## 2013-11-20 MED ORDER — OXYCODONE-ACETAMINOPHEN 5-325 MG PO TABS
1.0000 | ORAL_TABLET | Freq: Once | ORAL | Status: AC
  Administered 2013-11-20: 1 via ORAL
  Filled 2013-11-20: qty 1

## 2013-11-20 NOTE — ED Notes (Signed)
PTAR pending

## 2013-11-20 NOTE — ED Provider Notes (Addendum)
CSN: [id number]     Arrival date & time 11/19/13  2348 History   First MD Initiated Contact with Patient 11/20/13 0053     Chief Complaint  Patient presents with  . Wound Dehiscence   (Consider location/radiation/quality/duration/timing/severity/associated sxs/prior Treatment) Patient is a 52 y.o. male presenting with testicular pain. The history is provided by the patient.  Testicle Pain This is a new problem. Episode onset: 3 days ago. The problem occurs constantly. The problem has been gradually worsening. Associated symptoms comments: Pt had surgery on the 4th for tumor removal and 3 days ago his stitches popped.  He has been having blood draining from the surgical site and bleeding more tonight.  Denies fevers but feels testicle is more red and swollen than it was. The symptoms are aggravated by bending and coughing. Nothing relieves the symptoms. He has tried a cold compress for the symptoms. The treatment provided no relief.    Past Medical History  Diagnosis Date  . Epididymal pain     LEFT  . Hypertension   . Asthma   . 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 Migraine   . Type 2 diabetes mellitus   . Hyperthyroidism     NO MEDS   . GERD (gastroesophageal reflux disease)   . Gastric ulcer   . Frequency of urination   . Feeling of incomplete bladder emptying   . Borderline glaucoma   . Bipolar 1 disorder    Past Surgical History  Procedure Laterality Date  . Anterior cervical decomp/discectomy fusion  2007    C4  --  C6  . Testicle removal Left   . Multiple cyst removed from chest  AGE 5  . Excision lipoma left shoulder  2004 (APPROX)  . Epididymectomy Left 11/09/2013    Procedure:  LEFT EPIDIDYMECTOMY;  Surgeon: Bjorn Pippin, MD;  Location: [facility];  Service: Urology;  Laterality: Left;  POSSIBLE OUTPATIENT WITH OBSERVATION  . Cystoscopy N/A 11/09/2013   Procedure: CYSTOSCOPY FLEXIBLE;  Surgeon: Bjorn Pippin, MD;  Location: [facility];  Service: Urology;  Laterality: N/A;   No family history on file. History  Substance Use Topics  . Smoking status: Former Smoker -- 0.25 packs/day for 15 years    Types: Cigarettes    Quit date: 11/08/1992  . Smokeless tobacco: Never Used  . Alcohol Use: No    Review of Systems  Constitutional: Negative for fever.  Genitourinary: Positive for frequency and testicular pain. Negative for dysuria.  All other systems reviewed and are negative.    Allergies  Amoxicillin; Ampicillin; Dilantin; Nsaids; Oatmeal; Penicillins; Risperidone and related; Strawberry; and Tramadol  Home Medications   Current Outpatient Rx  Name  Route  Sig  Dispense  Refill  . atenolol (TENORMIN) 25 MG tablet   Oral   Take 25 mg by mouth daily.         . divalproex (DEPAKOTE) 500 MG DR tablet   Oral   Take 500-1,000 mg by mouth 2 (two) times daily. Take 500mg  in the AM and 1000mg  in the PM.         . eszopiclone (LUNESTA) 2 MG TABS tablet   Oral   Take 2 mg by mouth at bedtime as needed for sleep. Take immediately before bedtime         . fluticasone-salmeterol (ADVAIR HFA) 115-21 MCG/ACT  inhaler   Inhalation   Inhale 2 puffs into the lungs daily.         [name] lisinopril (PRINIVIL,ZESTRIL) 40 MG tablet   Oral   Take 40 mg by mouth daily.         [name] lurasidone (LATUDA) 40 MG TABS tablet   Oral   Take 20-40 mg by mouth every evening.         . metFORMIN (GLUCOPHAGE) 500 MG tablet   Oral   Take 1,000 mg by mouth 2 (two) times daily with a meal.          . omeprazole (PRILOSEC) 40 MG capsule   Oral   Take 40 mg by mouth daily.         [name] oxyCODONE (ROXICODONE) 5 MG immediate release tablet   Oral   Take 1 tablet (5 mg total) by mouth every 4 (four) hours as needed for severe pain.   40 tablet   0   . oxyCODONE-acetaminophen (ROXICET) 5-325 MG per tablet   Oral   Take 1 tablet by  mouth every 4 (four) hours as needed for severe pain.   30 tablet   0   . topiramate (TOPAMAX) 100 MG tablet   Oral   Take 300 mg by mouth every evening.          BP 138/88  Pulse 76  Temp(Src) 98.2 F (36.8 C) (Oral)  Resp 25  SpO2 97% Physical Exam  Nursing note and vitals reviewed. Constitutional: He is oriented to person, place, and time. He appears well-developed and well-nourished. No distress.  HENT:  Head: Normocephalic and atraumatic.  Mouth/Throat: Oropharynx is clear and moist.  Eyes: Conjunctivae and EOM are normal. Pupils are equal, round, and reactive to light.  Neck: Normal range of motion. Neck supple.  Cardiovascular: Normal rate, regular rhythm and intact distal pulses.   No murmur heard. Pulmonary/Chest: Effort normal and breath sounds normal. No respiratory distress. He has no wheezes. He has no rales.  Abdominal: Soft. He exhibits no distension. There is tenderness in the suprapubic area. There is no rebound and no guarding.  Genitourinary: Penis normal.    Left testis shows swelling and tenderness.  Right testicle abscent  Musculoskeletal: Normal range of motion. He exhibits no edema and no tenderness.  Neurological: He is alert and oriented to person, place, and time.  Skin: Skin is warm and dry. No rash noted. No erythema.  Psychiatric: He has a normal mood and affect. His behavior is normal.    ED Course  Procedures (including critical care time) Labs Review Labs Reviewed  URINALYSIS, ROUTINE W REFLEX MICROSCOPIC - Abnormal; Notable for the following:    Glucose, UA 250 (*)    All other components within normal limits   Imaging Review US Scrotum  11/20/2013   CLINICAL DATA:  Rule out testicular abscess.  EXAM: SCROTAL ULTRASOUND  DOPPLER ULTRASOUND OF THE TESTICLES  TECHNIQUE: Complete ultrasound examination of the testicles, epididymis, and other scrotal structures was performed. Color and spectral Doppler ultrasound were also utilized to  evaluate blood flow to the testicles.  COMPARISON:  None.  FINDINGS: Right testicle  Surgically absent  Left testicle  The testicle measures 4.1 x 2.9 x 3 cm. Flow appears increased, although there is no contralateral testicle to compare. The scrotal wall is thickened and hyperemic. There is a moderate hydrocele with multiple septations which are vascularized. There spermatic cord appears more hyperechoic than expected, likely postsurgical.  Pulsed Doppler interrogation  of both testes demonstrates low resistance arterial and venous waveforms on the left.  IMPRESSION: 1. Moderate volume complex left hydrocele which could represent postoperative hematocele or pyocele. 2. Hyperemic/inflammed left testicle and scrotal wall.   Electronically Signed   By: Tiburcio Pea M.D.   On: 11/20/2013 02:31   [place] Art/ven Flow Abd Pelv Doppler  11/20/2013   CLINICAL DATA:  Rule out testicular abscess.  EXAM: SCROTAL ULTRASOUND  DOPPLER ULTRASOUND OF THE TESTICLES  TECHNIQUE: Complete ultrasound examination of the testicles, epididymis, and other scrotal structures was performed. Color and spectral Doppler ultrasound were also utilized to evaluate blood flow to the testicles.  COMPARISON:  None.  FINDINGS: Right testicle  Surgically absent  Left testicle  The testicle measures 4.1 x 2.9 x 3 cm. Flow appears increased, although there is no contralateral testicle to compare. The scrotal wall is thickened and hyperemic. There is a moderate hydrocele with multiple septations which are vascularized. There spermatic cord appears more hyperechoic than expected, likely postsurgical.  Pulsed Doppler interrogation of both testes demonstrates low resistance arterial and venous waveforms on the left.  IMPRESSION: 1. Moderate volume complex left hydrocele which could represent postoperative hematocele or pyocele. 2. Hyperemic/inflammed left testicle and scrotal wall.   Electronically Signed   By: Tiburcio Pea M.D.   On: 11/20/2013 02:31     EKG Interpretation   None       MDM   1. Hydrocele   2. Cellulitis of scrotum     Patient with a history of testicular surgery at the beginning of December for removal of testicular tumors. Approximately 3 days ago his sutures popped. Since that time he had bleeding and drainage. He has an appointment to see urology this week but had increased bleeding tonight and came for evaluation. The testicle is swollen and firm and extremely tender upon palpation. There is no drainage noted at this time however the cloth that patient brought with him shows a serosanguineous type of drainage and no bright red blood at this time. Ultrasound shows a moderate volume complex left hydrocele which could represent a hematocele or pyocele. Also there is inflammation of the scrotal wall. This could all be postsurgical however given concern for infection by the way it looks will start patient on doxycycline and have him followup with urology to    Gwyneth Sprout, MD 11/20/13 1610  Gwyneth Sprout, MD 11/20/13 [id number]  Gwyneth Sprout, MD 11/20/13 5409

## 2014-11-08 ENCOUNTER — Emergency Department (HOSPITAL_COMMUNITY): Payer: Medicare Other

## 2014-11-08 ENCOUNTER — Encounter (HOSPITAL_COMMUNITY): Payer: Self-pay | Admitting: *Deleted

## 2014-11-08 ENCOUNTER — Emergency Department (HOSPITAL_COMMUNITY)
Admission: EM | Admit: 2014-11-08 | Discharge: 2014-11-08 | Disposition: A | Discharge status: Home / Self Care | Payer: Medicare Other | Attending: Emergency Medicine | Admitting: Emergency Medicine

## 2014-11-08 DIAGNOSIS — Z79899 Other long term (current) drug therapy: Secondary | ICD-10-CM | POA: Diagnosis not present

## 2014-11-08 DIAGNOSIS — Z792 Long term (current) use of antibiotics: Secondary | ICD-10-CM | POA: Diagnosis not present

## 2014-11-08 DIAGNOSIS — F319 Bipolar disorder, unspecified: Secondary | ICD-10-CM | POA: Insufficient documentation

## 2014-11-08 DIAGNOSIS — Z880 Allergy status to penicillin: Secondary | ICD-10-CM | POA: Insufficient documentation

## 2014-11-08 DIAGNOSIS — J45909 Unspecified asthma, uncomplicated: Secondary | ICD-10-CM | POA: Insufficient documentation

## 2014-11-08 DIAGNOSIS — Z7951 Long term (current) use of inhaled steroids: Secondary | ICD-10-CM | POA: Diagnosis not present

## 2014-11-08 DIAGNOSIS — I10 Essential (primary) hypertension: Secondary | ICD-10-CM | POA: Insufficient documentation

## 2014-11-08 DIAGNOSIS — K219 Gastro-esophageal reflux disease without esophagitis: Secondary | ICD-10-CM | POA: Diagnosis not present

## 2014-11-08 DIAGNOSIS — E119 Type 2 diabetes mellitus without complications: Secondary | ICD-10-CM | POA: Diagnosis not present

## 2014-11-08 DIAGNOSIS — R079 Chest pain, unspecified: Secondary | ICD-10-CM | POA: Diagnosis present

## 2014-11-08 DIAGNOSIS — Z87891 Personal history of nicotine dependence: Secondary | ICD-10-CM | POA: Diagnosis not present

## 2014-11-08 DIAGNOSIS — G43909 Migraine, unspecified, not intractable, without status migrainosus: Secondary | ICD-10-CM | POA: Diagnosis not present

## 2014-11-08 LAB — CBC
HCT: 41.2 % (ref 39.0–52.0)
Hemoglobin: 13.5 g/dL (ref 13.0–17.0)
MCH: 28 pg (ref 26.0–34.0)
MCHC: 32.8 g/dL (ref 30.0–36.0)
MCV: 85.3 fL (ref 78.0–100.0)
Platelets: 122 10*3/uL — ABNORMAL LOW (ref 150–400)
RBC: 4.83 MIL/uL (ref 4.22–5.81)
RDW: 15.2 % (ref 11.5–15.5)
WBC: 6.4 10*3/uL (ref 4.0–10.5)

## 2014-11-08 LAB — BASIC METABOLIC PANEL
Anion gap: 13 (ref 5–15)
BUN: 15 mg/dL (ref 6–23)
CO2: 23 mEq/L (ref 19–32)
Calcium: 9.5 mg/dL (ref 8.4–10.5)
Chloride: 107 mEq/L (ref 96–112)
Creatinine, Ser: 1.34 mg/dL (ref 0.50–1.35)
GFR calc Af Amer: 69 mL/min — ABNORMAL LOW (ref >90)
GFR calc non Af Amer: 59 mL/min — ABNORMAL LOW (ref >90)
Glucose, Bld: 117 mg/dL — ABNORMAL HIGH (ref 70–99)
Potassium: 4.2 mEq/L (ref 3.7–5.3)
Sodium: 143 mEq/L (ref 137–147)

## 2014-11-08 LAB — I-STAT TROPONIN, ED: Troponin i, poc: 0 ng/mL (ref 0.00–0.08)

## 2014-11-08 MED ORDER — OMEPRAZOLE 20 MG PO CPDR: 20.0000 mg | DELAYED_RELEASE_CAPSULE | Freq: Two times a day (BID) | ORAL | Status: DC

## 2014-11-08 NOTE — ED Provider Notes (Signed)
CSN: [id number]     Arrival date & time 11/08/14  1650 History   First MD Initiated Contact with Patient 11/08/14 1803     Chief Complaint  Patient presents with  . Chest Pain     (Consider location/radiation/quality/duration/timing/severity/associated sxs/prior Treatment) HPI Comments: Patient is a 53 year old male with past medical history of hypertension, diabetes. He presents today with complaints of discomfort in the center of his chest that has been present for the past week. It occasionally waxes and wanes, however it is for the most part constant. He denies any shortness of breath, diaphoresis, nausea, or radiation to the arm or jaw. He denies any exertional symptoms. He states that it is somewhat worse when he eats.  He tells me he had a stress test done at [facility] 3 or 4 months ago which was normal. He otherwise has no prior cardiac history.  Patient is a 53 y.o. male presenting with chest pain. The history is provided by the patient.  Chest Pain Pain location:  Substernal area Pain quality: pressure   Pain radiates to:  Does not radiate Pain radiates to the back: no   Pain severity:  Moderate Onset quality:  Gradual Duration:  1 week Timing:  Constant Progression:  Unchanged Chronicity:  New Context: not breathing   Relieved by:  Nothing Worsened by:  Nothing tried Ineffective treatments:  None tried   Past Medical History  Diagnosis Date  . Epididymal pain     LEFT  . Hypertension   . Asthma   . 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 Migraine   . Type 2 diabetes mellitus   . Hyperthyroidism     NO MEDS   . GERD (gastroesophageal reflux disease)   . Gastric ulcer   . Frequency of urination   . Feeling of incomplete bladder emptying   . Borderline glaucoma   . Bipolar 1 disorder    Past Surgical History  Procedure Laterality Date  . Anterior cervical  decomp/discectomy fusion  2007    C4  --  C6  . Testicle removal Left   . Multiple cyst removed from chest  AGE 43  . Excision lipoma left shoulder  2004 (APPROX)  . Epididymectomy Left 11/09/2013    Procedure:  LEFT EPIDIDYMECTOMY;  Surgeon: Bjorn PippinJohn Wrenn, MD;  [facility];  Service: Urology;  Laterality: Left;  POSSIBLE OUTPATIENT WITH OBSERVATION  . Cystoscopy N/A 11/09/2013    Procedure: CYSTOSCOPY FLEXIBLE;  Surgeon: Bjorn PippinJohn Wrenn, MD;  [facility] [place];  Service: Urology;  Laterality: N/A;   No family history on file. History  Substance Use Topics  . Smoking status: Former Smoker -- 0.25 packs/day for 15 years    Types: Cigarettes    Quit date: 11/08/1992  . Smokeless tobacco: Never Used  . Alcohol Use: No    Review of Systems  Cardiovascular: Positive for chest pain.  All other systems reviewed and are negative.     Allergies  Nsaids; Tramadol; Amoxicillin; Ampicillin; Dilantin; Penicillins; Risperidone and related; Strawberry; and Oatmeal  Home Medications   Prior to Admission medications   Medication Sig Start Date End Date Taking? Authorizing Provider  atenolol (TENORMIN) 25 MG tablet Take 25 mg by mouth daily.    Historical Provider, MD  divalproex (DEPAKOTE) 500 MG DR tablet Take 500-1,000 mg by mouth 2 (two)  times daily. Take 500mg  in the AM and 1000mg  in the PM.    Historical Provider, MD  doxycycline (VIBRAMYCIN) 100 MG capsule Take 1 capsule (100 mg total) by mouth 2 (two) times daily. 11/20/13   Gwyneth SproutWhitney Plunkett, MD  eszopiclone (LUNESTA) 2 MG TABS tablet Take 2 mg by mouth at bedtime as needed for sleep. Take immediately before bedtime    Historical Provider, MD  fluticasone-salmeterol (ADVAIR HFA) 115-21 MCG/ACT inhaler Inhale 2 puffs into the lungs daily.    Historical Provider, MD  lisinopril (PRINIVIL,ZESTRIL) 40 MG tablet Take 40 mg by mouth daily.    Historical Provider, MD  lurasidone (LATUDA) 40 MG TABS tablet Take  20-40 mg by mouth every evening.    Historical Provider, MD  metFORMIN (GLUCOPHAGE) 500 MG tablet Take 1,000 mg by mouth 2 (two) times daily with a meal.     Historical Provider, MD  omeprazole (PRILOSEC) 40 MG capsule Take 40 mg by mouth daily.    Historical Provider, MD  oxyCODONE (ROXICODONE) 5 MG immediate release tablet Take 1 tablet (5 mg total) by mouth every 4 (four) hours as needed for severe pain. 11/10/13   Anner CreteJohn J Wrenn, MD  oxyCODONE-acetaminophen (PERCOCET/ROXICET) 5-325 MG per tablet Take 1 tablet by mouth every 6 (six) hours as needed. 11/20/13   Gwyneth SproutWhitney Plunkett, MD  oxyCODONE-acetaminophen (ROXICET) 5-325 MG per tablet Take 1 tablet by mouth every 4 (four) hours as needed for severe pain. 11/09/13   Anner CreteJohn J Wrenn, MD  topiramate (TOPAMAX) 100 MG tablet Take 300 mg by mouth every evening.    Historical Provider, MD   BP 148/82 mmHg  Pulse 101  Temp(Src) 98.1 F (36.7 C)  Resp 20  Ht 5\' 7"  (1.702 m)  Wt 189 lb (85.73 kg)  BMI 29.59 kg/m2  SpO2 100% Physical Exam  Constitutional: He is oriented to person, place, and time. He appears well-developed and well-nourished. No distress.  HENT:  Head: Normocephalic and atraumatic.  Mouth/Throat: Oropharynx is clear and moist.  Neck: Normal range of motion. Neck supple.  Cardiovascular: Normal rate, regular rhythm and normal heart sounds.   No murmur heard. Pulmonary/Chest: Effort normal and breath sounds normal. No respiratory distress. He has no wheezes.  Abdominal: Soft. Bowel sounds are normal. He exhibits no distension. There is no tenderness.  Musculoskeletal: Normal range of motion. He exhibits no edema.  Lymphadenopathy:    He has no cervical adenopathy.  Neurological: He is alert and oriented to person, place, and time.  Skin: Skin is warm and dry. He is not diaphoretic.  Nursing note and vitals reviewed.   ED Course  Procedures (including critical care time) Labs Review Labs Reviewed  CBC - Abnormal; Notable for the  following:    Platelets 122 (*)    All other components within normal limits  BASIC METABOLIC PANEL - Abnormal; Notable for the following:    Glucose, Bld 117 (*)    GFR calc non Af Amer 59 (*)    GFR calc Af Amer 69 (*)    All other components within normal limits  I-STAT TROPOININ, ED    Imaging Review No results found.   EKG Interpretation   Date/Time:  Thursday November 08 2014 16:58:29 EST Ventricular Rate:  80 PR Interval:  154 QRS Duration: 86 QT Interval:  382 QTC Calculation: 440 R Axis:   -23 Text Interpretation:  Normal sinus rhythm Minimal voltage criteria for  LVH, may be normal variant Cannot rule out Anterior infarct , age  undetermined Abnormal ECG Confirmed by Malva Cogan  MD, Mikeisha Lemonds ([id number]) on  11/08/2014 6:14:27 PM      MDM   Final diagnoses:  Chest pain    Patient is a 53 year old male with history of diabetes. He presents with a one-week history of chest discomfort. His discomfort is not typical for cardiac pain and his workup is unremarkable. He had a stress test last month performed in [facility] which was unremarkable. I doubt a cardiac etiology and feel as though his symptoms sound more GI in nature. I will recommend Prilosec and follow-up with his cardiologist in [facility]. He is to return as needed if he experiences any further difficulties.    [name], MD 11/08/14 [phone]

## 2014-11-08 NOTE — ED Notes (Signed)
Pt to radiology at this time.

## 2014-11-08 NOTE — ED Notes (Signed)
Patient states chest pain x 1 week, feels like heartburn per patient

## 2014-11-08 NOTE — Discharge Instructions (Signed)
Prilosec 20 mg twice daily for the next 2 weeks.  Follow up with your cardiologist to discuss further testing and return to the ER if your symptoms substantially worsen or change. If you are unable to obtain this appointment and would like to see our cardiologist, the contact information for the cardiology office has been provided in our discharge summary.   Chest Pain (Nonspecific) It is often hard to give a specific diagnosis for the cause of chest pain. There is always a chance that your pain could be related to something serious, such as a heart attack or a blood clot in the lungs. You need to follow up with your health care provider for further evaluation. CAUSES   Heartburn.  Pneumonia or bronchitis.  Anxiety or stress.  Inflammation around your heart (pericarditis) or lung (pleuritis or pleurisy).  A blood clot in the lung.  A collapsed lung (pneumothorax). It can develop suddenly on its own (spontaneous pneumothorax) or from trauma to the chest.  Shingles infection (herpes zoster virus). The chest wall is composed of bones, muscles, and cartilage. Any of these can be the source of the pain.  The bones can be bruised by injury.  The muscles or cartilage can be strained by coughing or overwork.  The cartilage can be affected by inflammation and become sore (costochondritis). DIAGNOSIS  Lab tests or other studies may be needed to find the cause of your pain. Your health care provider may have you take a test called an ambulatory electrocardiogram (ECG). An ECG records your heartbeat patterns over a 24-hour period. You may also have other tests, such as:  Transthoracic echocardiogram (TTE). During echocardiography, sound waves are used to evaluate how blood flows through your heart.  Transesophageal echocardiogram (TEE).  Cardiac monitoring. This allows your health care provider to monitor your heart rate and rhythm in real time.  Holter monitor. This is a portable device  that records your heartbeat and can help diagnose heart arrhythmias. It allows your health care provider to track your heart activity for several days, if needed.  Stress tests by exercise or by giving medicine that makes the heart beat faster. TREATMENT   Treatment depends on what may be causing your chest pain. Treatment may include:  Acid blockers for heartburn.  Anti-inflammatory medicine.  Pain medicine for inflammatory conditions.  Antibiotics if an infection is present.  You may be advised to change lifestyle habits. This includes stopping smoking and avoiding alcohol, caffeine, and chocolate.  You may be advised to keep your head raised (elevated) when sleeping. This reduces the chance of acid going backward from your stomach into your esophagus. Most of the time, nonspecific chest pain will improve within 2-3 days with rest and mild pain medicine.  HOME CARE INSTRUCTIONS   If antibiotics were prescribed, take them as directed. Finish them even if you start to feel better.  For the next few days, avoid physical activities that bring on chest pain. Continue physical activities as directed.  Do not use any tobacco products, including cigarettes, chewing tobacco, or electronic cigarettes.  Avoid drinking alcohol.  Only take medicine as directed by your health care provider.  Follow your health care provider's suggestions for further testing if your chest pain does not go away.  Keep any follow-up appointments you made. If you do not go to an appointment, you could develop lasting (chronic) problems with pain. If there is any problem keeping an appointment, call to reschedule. SEEK MEDICAL CARE IF:   Your  chest pain does not go away, even after treatment.  You have a rash with blisters on your chest.  You have a fever. SEEK IMMEDIATE MEDICAL CARE IF:   You have increased chest pain or pain that spreads to your arm, neck, jaw, back, or abdomen.  You have shortness of  breath.  You have an increasing cough, or you cough up blood.  You have severe back or abdominal pain.  You feel nauseous or vomit.  You have severe weakness.  You faint.  You have chills. This is an emergency. Do not wait to see if the pain will go away. Get medical help at once. Call your local emergency services (911 in [place] Do not drive yourself to the hospital. MAKE SURE YOU:   Understand these instructions.  Will watch your condition.  Will get help right away if you are not doing well or get worse. Document Released: 09/02/2005 Document Revised: 11/28/2013 Document Reviewed: 06/28/2008 [facility] Patient Information 2015 [employer] [place]. This information is not intended to replace advice given to you by your health care provider. Make sure you discuss any questions you have with your health care provider.

## 2014-11-09 ENCOUNTER — Emergency Department (HOSPITAL_BASED_OUTPATIENT_CLINIC_OR_DEPARTMENT_OTHER)
Admission: EM | Admit: 2014-11-09 | Discharge: 2014-11-09 | Disposition: A | Discharge status: Home / Self Care | Payer: Medicare Other | Attending: Emergency Medicine | Admitting: Emergency Medicine

## 2014-11-09 ENCOUNTER — Encounter (HOSPITAL_BASED_OUTPATIENT_CLINIC_OR_DEPARTMENT_OTHER): Payer: Self-pay

## 2014-11-09 DIAGNOSIS — G43909 Migraine, unspecified, not intractable, without status migrainosus: Secondary | ICD-10-CM | POA: Insufficient documentation

## 2014-11-09 DIAGNOSIS — Z87891 Personal history of nicotine dependence: Secondary | ICD-10-CM | POA: Insufficient documentation

## 2014-11-09 DIAGNOSIS — J45909 Unspecified asthma, uncomplicated: Secondary | ICD-10-CM | POA: Insufficient documentation

## 2014-11-09 DIAGNOSIS — Z79899 Other long term (current) drug therapy: Secondary | ICD-10-CM | POA: Diagnosis not present

## 2014-11-09 DIAGNOSIS — G40909 Epilepsy, unspecified, not intractable, without status epilepticus: Secondary | ICD-10-CM

## 2014-11-09 DIAGNOSIS — Z880 Allergy status to penicillin: Secondary | ICD-10-CM | POA: Insufficient documentation

## 2014-11-09 DIAGNOSIS — K219 Gastro-esophageal reflux disease without esophagitis: Secondary | ICD-10-CM | POA: Insufficient documentation

## 2014-11-09 DIAGNOSIS — R569 Unspecified convulsions: Secondary | ICD-10-CM | POA: Diagnosis present

## 2014-11-09 DIAGNOSIS — I10 Essential (primary) hypertension: Secondary | ICD-10-CM | POA: Diagnosis not present

## 2014-11-09 DIAGNOSIS — Z7951 Long term (current) use of inhaled steroids: Secondary | ICD-10-CM | POA: Insufficient documentation

## 2014-11-09 DIAGNOSIS — M6281 Muscle weakness (generalized): Secondary | ICD-10-CM | POA: Diagnosis not present

## 2014-11-09 DIAGNOSIS — E119 Type 2 diabetes mellitus without complications: Secondary | ICD-10-CM | POA: Insufficient documentation

## 2014-11-09 DIAGNOSIS — F319 Bipolar disorder, unspecified: Secondary | ICD-10-CM | POA: Insufficient documentation

## 2014-11-09 LAB — BASIC METABOLIC PANEL
Anion gap: 15 (ref 5–15)
BUN: 15 mg/dL (ref 6–23)
CO2: 20 mEq/L (ref 19–32)
Calcium: 9.5 mg/dL (ref 8.4–10.5)
Chloride: 109 mEq/L (ref 96–112)
Creatinine, Ser: 1.2 mg/dL (ref 0.50–1.35)
GFR calc Af Amer: 79 mL/min — ABNORMAL LOW (ref >90)
GFR calc non Af Amer: 68 mL/min — ABNORMAL LOW (ref >90)
Glucose, Bld: 94 mg/dL (ref 70–99)
Potassium: 4.2 mEq/L (ref 3.7–5.3)
Sodium: 144 mEq/L (ref 137–147)

## 2014-11-09 LAB — VALPROIC ACID LEVEL: Valproic Acid Lvl: 82.2 ug/mL (ref 50.0–100.0)

## 2014-11-09 LAB — CBG MONITORING, ED: Glucose-Capillary: 109 mg/dL — ABNORMAL HIGH (ref 70–99)

## 2014-11-09 NOTE — ED Notes (Signed)
CBG was 109 

## 2014-11-09 NOTE — ED Notes (Signed)
[facility] called at this time to come pick pt up.

## 2014-11-09 NOTE — ED Notes (Signed)
After arrival to room, pts right arm and leg began shaking. Afterwards, pt was responding, A&O x 4. Not post ictal

## 2014-11-09 NOTE — ED Provider Notes (Signed)
CSN: [id number]     Arrival date & time 11/09/14  1224 History   First MD Initiated Contact with Patient 11/09/14 1445     Chief Complaint  Patient presents with  . Seizures     (Consider location/radiation/quality/duration/timing/severity/associated sxs/prior Treatment) HPI Comments: Patient with history of epilepsy presents with complaint of seizure. Patient is currently at [facility] for substance abuse (crack cocaine -- no alcohol/benzos). Patient states that he was sitting today and began having twitching in his left side. He then had a full body seizure. Last seizure was 2 weeks ago. Patient states that he typically has a seizure every 2 weeks or so. Patient was found to have a borderline low blood sugar and was given glucose by EMS. He was transported to the hospital for evaluation. Patient states that nothing about the seizure was unusual. Patient reports receiving his antiepileptic medications daily. He last saw his neurologist September 2015. Patient denies hitting his head. He states that he bit his tongue and had urinary incontinence. Patient also complains of left-sided weakness. Patient states that after a seizure he typically will have left-sided weakness for approximately 1 day.  Patient is a 53 y.o. male presenting with seizures. The history is provided by the patient and medical records.  Seizures   Past Medical History  Diagnosis Date  . Epididymal pain     LEFT  . Hypertension   . Asthma   . Epilepsy, 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 33---  LAST SEIZURE 1 WK AGO (APPROX ,  10-31-2013)    NO NEUROLOGIST---  PT SEES PCP  DR Lindajo RoyalAVLOUT  . Migraine   . Type 2 diabetes mellitus   . Hyperthyroidism     NO MEDS   . GERD (gastroesophageal reflux disease)   . Gastric ulcer   . Frequency of urination   . Feeling of incomplete bladder emptying   . Borderline glaucoma   . Bipolar 1 disorder    Past Surgical History  Procedure Laterality Date  . Anterior cervical decomp/discectomy fusion  2007     C4  --  C6  . Testicle removal Left   . Multiple cyst removed from chest  AGE 89  . Excision lipoma left shoulder  2004 (APPROX)  . Epididymectomy Left 11/09/2013    Procedure:  LEFT EPIDIDYMECTOMY;  Surgeon: Bjorn PippinJohn Wrenn, MD;  [facility] [place];  Service: Urology;  Laterality: Left;  POSSIBLE OUTPATIENT WITH OBSERVATION  . Cystoscopy N/A 11/09/2013    Procedure: CYSTOSCOPY FLEXIBLE;  Surgeon: Bjorn PippinJohn Wrenn, MD;  [facility] [place];  Service: Urology;  Laterality: N/A;   No family history on file. History  Substance Use Topics  . Smoking status: Former Smoker -- 0.25 packs/day for 15 years    Types: Cigarettes    Quit date: 11/08/1992  . Smokeless tobacco: Never Used  . Alcohol Use: No    Review of Systems  Constitutional: Negative for fatigue.  HENT: Negative for tinnitus.   Eyes: Negative for photophobia, pain and visual disturbance.  Respiratory: Negative for shortness of breath.   Cardiovascular: Negative for chest pain.  Gastrointestinal: Negative for nausea and vomiting.  Musculoskeletal: Negative for back pain, gait problem and neck pain.  Skin: Negative for wound.  Neurological: Positive for seizures and weakness. Negative for dizziness, light-headedness, numbness and headaches.  Psychiatric/Behavioral: Negative for confusion and decreased concentration.    Allergies  Nsaids; Tramadol; Amoxicillin; Ampicillin; Dilantin; Penicillins; Risperidone and related; Strawberry; Bactrim; and Oatmeal  Home Medications   Prior to  Admission medications   Medication Sig Start Date End Date Taking? Authorizing Provider  divalproex (DEPAKOTE) 500 MG DR tablet Take 500-1,000 mg by mouth 2 (two) times daily. Take 500mg  in the AM and 1000mg  in the PM.    Historical Provider, MD  doxycycline (VIBRAMYCIN) 100 MG capsule Take 1 capsule (100 mg total) by mouth 2 (two) times daily. Patient not taking: Reported on 11/08/2014 11/20/13   Gwyneth SproutWhitney Plunkett, MD   fluticasone-salmeterol (ADVAIR HFA) 115-21 MCG/ACT inhaler Inhale 2 puffs into the lungs daily.    Historical Provider, MD  LATUDA 60 MG TABS Take 60 mg by mouth daily. 10/03/14   Historical Provider, MD  metFORMIN (GLUCOPHAGE) 500 MG tablet Take 1,000 mg by mouth 2 (two) times daily with a meal.     Historical Provider, MD  omeprazole (PRILOSEC) 20 MG capsule Take 1 capsule (20 mg total) by mouth 2 (two) times daily. 11/08/14   Geoffery Lyonsouglas Delo, MD  oxyCODONE (ROXICODONE) 5 MG immediate release tablet Take 1 tablet (5 mg total) by mouth every 4 (four) hours as needed for severe pain. Patient not taking: Reported on 11/08/2014 11/10/13   Anner CreteJohn J Wrenn, MD  oxyCODONE-acetaminophen (PERCOCET/ROXICET) 5-325 MG per tablet Take 1 tablet by mouth every 6 (six) hours as needed. Patient not taking: Reported on 11/08/2014 11/20/13   Gwyneth SproutWhitney Plunkett, MD  oxyCODONE-acetaminophen (ROXICET) 5-325 MG per tablet Take 1 tablet by mouth every 4 (four) hours as needed for severe pain. Patient not taking: Reported on 11/08/2014 11/09/13   Anner CreteJohn J Wrenn, MD  topiramate (TOPAMAX) 100 MG tablet Take 300 mg by mouth every evening.    Historical Provider, MD   BP 114/68 mmHg  Pulse 63  Temp(Src) 97.7 F (36.5 C) (Oral)  Resp 16  Ht 5\' 7"  (1.702 m)  Wt 189 lb (85.73 kg)  BMI 29.59 kg/m2  SpO2 95%   Physical Exam  Constitutional: He is oriented to person, place, and time. He appears well-developed and well-nourished.  HENT:  Head: Normocephalic and atraumatic.  Right Ear: Tympanic membrane, external ear and ear canal normal.  Left Ear: Tympanic membrane, external ear and ear canal normal.  Nose: Nose normal.  Mouth/Throat: Uvula is midline, oropharynx is clear and moist and mucous membranes are normal.  Eyes: Conjunctivae, EOM and lids are normal. Pupils are equal, round, and reactive to light.  Neck: Normal range of motion. Neck supple.  Cardiovascular: Normal rate and regular rhythm.   No murmur  heard. Pulmonary/Chest: Effort normal and breath sounds normal. No respiratory distress. He has no wheezes. He has no rales.  Abdominal: Soft. There is no tenderness.  Musculoskeletal: Normal range of motion. He exhibits no edema or tenderness.       Cervical back: He exhibits normal range of motion, no tenderness and no bony tenderness.  Neurological: He is alert and oriented to person, place, and time. He has normal reflexes. No cranial nerve deficit or sensory deficit. He exhibits normal muscle tone. He displays a negative Romberg sign. Coordination normal. GCS eye subscore is 4. GCS verbal subscore is 5. GCS motor subscore is 6.  4+/5 strength and slower responsiveness of L leg and L arm  Skin: Skin is warm and dry.  Psychiatric: He has a normal mood and affect.  Nursing note and vitals reviewed.   ED Course  Procedures (including critical care time) Labs Review Labs Reviewed  BASIC METABOLIC PANEL - Abnormal; Notable for the following:    GFR calc non Af Amer 68 (*)  GFR calc Af Amer 79 (*)    All other components within normal limits  CBG MONITORING, ED - Abnormal; Notable for the following:    Glucose-Capillary 109 (*)    All other components within normal limits  VALPROIC ACID LEVEL    Imaging Review Dg Chest 2 View  11/08/2014   CLINICAL DATA:  Cough and short of breath for 5 day  EXAM: CHEST  2 VIEW  COMPARISON:  08/01/2014  FINDINGS: Borderline cardiomegaly. Clear lungs. Normal vascularity. No pleural effusion. No pneumothorax.  IMPRESSION: No active cardiopulmonary disease.   Electronically Signed   By: Maryclare Bean M.D.   On: 11/08/2014 18:48     EKG Interpretation None       3:40 PM Patient seen and examined. Work-up initiated. Pt currently at baseline and stable.    Vital signs reviewed and are as follows: BP 114/68 mmHg  Pulse 63  Temp(Src) 97.7 F (36.5 C) (Oral)  Resp 16  Ht 5\' 7"  (1.702 m)  Wt 189 lb (85.73 kg)  BMI 29.59 kg/m2  SpO2 95%  6:44 PM  patient has remained stable during his emergency department stay. No further seizure-like activity. Will discharge back to [facility]. Patient in agreement.  Patient urged to return with worsening symptoms, additional seizures or other concerns. Patient verbalized understanding and agrees with plan.     MDM   Final diagnoses:  Seizure disorder   Patient with history of seizure disorder presents after having a seizure today. Electrodes are normal. Depakote level is therapeutic. Normal CBG. No further seizure activity. There was nothing unusual or atypical about today's seizure. Patient can follow-up with neurologist to discuss medications. Feel patient is safe for discharge at this time.  Renne Crigler, PA-C 11/09/14 1846  Vanetta Mulders, MD 11/09/14 [phone]

## 2014-11-09 NOTE — ED Notes (Signed)
Seizure pads placed on stretcher

## 2014-11-09 NOTE — ED Notes (Signed)
Pt [facility] for seizure. Pts CBG on scene was 63, given glucose and OJ, rechecked and was 73, 85 afterwards.

## 2014-11-09 NOTE — Discharge Instructions (Signed)
Please read and follow all provided instructions.  Your diagnoses today include:  1. Seizure disorder     Tests performed today include:  Electrolytes - normal  Depakote level - normal  Vital signs. See below for your results today.   Medications prescribed:   None  Take any prescribed medications only as directed.  Home care instructions:  Follow any educational materials contained in this packet.  BE VERY CAREFUL not to take multiple medicines containing Tylenol (also called acetaminophen). Doing so can lead to an overdose which can damage your liver and cause liver failure and possibly death.   Follow-up instructions: Please follow-up with your primary care provider in the next 2 days for further evaluation of your symptoms.   Return instructions:   Please return to the Emergency Department if you experience worsening symptoms.  Return with additional seizure.    Please return if you have any other emergent concerns.  Additional Information:  Your vital signs today were: BP 114/68 mmHg   Pulse 63   Temp(Src) 97.7 F (36.5 C) (Oral)   Resp 16   Ht 5\' 7"  (1.702 m)   Wt 189 lb (85.73 kg)   BMI 29.59 kg/m2   SpO2 95% If your blood pressure (BP) was elevated above 135/85 this visit, please have this repeated by your doctor within one month. --------------

## 2014-12-31 ENCOUNTER — Emergency Department: Payer: Self-pay | Admitting: Emergency Medicine

## 2014-12-31 LAB — COMPREHENSIVE METABOLIC PANEL
Albumin: 3.8 g/dL (ref 3.4–5.0)
Alkaline Phosphatase: 52 U/L
Anion Gap: 10 (ref 7–16)
BUN: 19 mg/dL — ABNORMAL HIGH (ref 7–18)
Bilirubin,Total: 0.2 mg/dL (ref 0.2–1.0)
Calcium, Total: 9.5 mg/dL (ref 8.5–10.1)
Chloride: 113 mmol/L — ABNORMAL HIGH (ref 98–107)
Co2: 20 mmol/L — ABNORMAL LOW (ref 21–32)
Creatinine: 1.19 mg/dL (ref 0.60–1.30)
EGFR (African American): >60
EGFR (Non-African Amer.): >60
Glucose: 104 mg/dL — ABNORMAL HIGH (ref 65–99)
Osmolality: 288 (ref 275–301)
Potassium: 3.8 mmol/L (ref 3.5–5.1)
SGOT(AST): 24 U/L (ref 15–37)
SGPT (ALT): 36 U/L
Sodium: 143 mmol/L (ref 136–145)
Total Protein: 7.3 g/dL (ref 6.4–8.2)

## 2014-12-31 LAB — CBC
HCT: 41.9 % (ref 40.0–52.0)
HGB: 13.7 g/dL (ref 13.0–18.0)
MCH: 27.8 pg (ref 26.0–34.0)
MCHC: 32.8 g/dL (ref 32.0–36.0)
MCV: 85 fL (ref 80–100)
Platelet: 150 10*3/uL (ref 150–440)
RBC: 4.94 10*6/uL (ref 4.40–5.90)
RDW: 15.7 % — ABNORMAL HIGH (ref 11.5–14.5)
WBC: 7.3 10*3/uL (ref 3.8–10.6)

## 2014-12-31 LAB — VALPROIC ACID LEVEL: Valproic Acid: <3 ug/mL — ABNORMAL LOW

## 2015-07-01 ENCOUNTER — Emergency Department (HOSPITAL_BASED_OUTPATIENT_CLINIC_OR_DEPARTMENT_OTHER): Payer: Medicare Other

## 2015-07-01 ENCOUNTER — Encounter (HOSPITAL_BASED_OUTPATIENT_CLINIC_OR_DEPARTMENT_OTHER): Payer: Self-pay | Admitting: *Deleted

## 2015-07-01 ENCOUNTER — Emergency Department (HOSPITAL_BASED_OUTPATIENT_CLINIC_OR_DEPARTMENT_OTHER)
Admission: EM | Admit: 2015-07-01 | Discharge: 2015-07-01 | Disposition: A | Discharge status: Home / Self Care | Payer: Medicare Other | Attending: Emergency Medicine | Admitting: Emergency Medicine

## 2015-07-01 DIAGNOSIS — Z7951 Long term (current) use of inhaled steroids: Secondary | ICD-10-CM | POA: Diagnosis not present

## 2015-07-01 DIAGNOSIS — I10 Essential (primary) hypertension: Secondary | ICD-10-CM | POA: Diagnosis not present

## 2015-07-01 DIAGNOSIS — Z87438 Personal history of other diseases of male genital organs: Secondary | ICD-10-CM | POA: Insufficient documentation

## 2015-07-01 DIAGNOSIS — Z880 Allergy status to penicillin: Secondary | ICD-10-CM | POA: Insufficient documentation

## 2015-07-01 DIAGNOSIS — J020 Streptococcal pharyngitis: Secondary | ICD-10-CM | POA: Insufficient documentation

## 2015-07-01 DIAGNOSIS — Z87891 Personal history of nicotine dependence: Secondary | ICD-10-CM | POA: Diagnosis not present

## 2015-07-01 DIAGNOSIS — Z79899 Other long term (current) drug therapy: Secondary | ICD-10-CM | POA: Diagnosis not present

## 2015-07-01 DIAGNOSIS — G43909 Migraine, unspecified, not intractable, without status migrainosus: Secondary | ICD-10-CM | POA: Insufficient documentation

## 2015-07-01 DIAGNOSIS — J45909 Unspecified asthma, uncomplicated: Secondary | ICD-10-CM | POA: Insufficient documentation

## 2015-07-01 DIAGNOSIS — E119 Type 2 diabetes mellitus without complications: Secondary | ICD-10-CM | POA: Insufficient documentation

## 2015-07-01 DIAGNOSIS — G40409 Other generalized epilepsy and epileptic syndromes, not intractable, without status epilepticus: Secondary | ICD-10-CM | POA: Insufficient documentation

## 2015-07-01 DIAGNOSIS — J029 Acute pharyngitis, unspecified: Secondary | ICD-10-CM | POA: Diagnosis present

## 2015-07-01 DIAGNOSIS — Z792 Long term (current) use of antibiotics: Secondary | ICD-10-CM | POA: Diagnosis not present

## 2015-07-01 DIAGNOSIS — K219 Gastro-esophageal reflux disease without esophagitis: Secondary | ICD-10-CM | POA: Diagnosis not present

## 2015-07-01 DIAGNOSIS — F319 Bipolar disorder, unspecified: Secondary | ICD-10-CM | POA: Diagnosis not present

## 2015-07-01 DIAGNOSIS — Z8639 Personal history of other endocrine, nutritional and metabolic disease: Secondary | ICD-10-CM | POA: Diagnosis not present

## 2015-07-01 LAB — URINALYSIS, ROUTINE W REFLEX MICROSCOPIC
Bilirubin Urine: NEGATIVE
Glucose, UA: 100 mg/dL — AB
Hgb urine dipstick: NEGATIVE
Ketones, ur: NEGATIVE mg/dL
Leukocytes, UA: NEGATIVE
Nitrite: NEGATIVE
Protein, ur: 30 mg/dL — AB
Specific Gravity, Urine: 1.024 (ref 1.005–1.030)
Urobilinogen, UA: 1 mg/dL (ref 0.0–1.0)
pH: 6.5 (ref 5.0–8.0)

## 2015-07-01 LAB — URINE MICROSCOPIC-ADD ON

## 2015-07-01 LAB — RAPID STREP SCREEN (MED CTR MEBANE ONLY): Streptococcus, Group A Screen (Direct): POSITIVE — AB

## 2015-07-01 MED ORDER — ACETAMINOPHEN 325 MG PO TABS
650.0000 mg | ORAL_TABLET | Freq: Once | ORAL | Status: AC
  Administered 2015-07-01: 650 mg via ORAL
  Filled 2015-07-01: qty 2

## 2015-07-01 MED ORDER — AZITHROMYCIN 250 MG PO TABS
500.0000 mg | ORAL_TABLET | Freq: Once | ORAL | Status: AC
  Administered 2015-07-01: 500 mg via ORAL
  Filled 2015-07-01: qty 2

## 2015-07-01 MED ORDER — AZITHROMYCIN 250 MG PO TABS: 250.0000 mg | ORAL_TABLET | Freq: Every day | ORAL | Status: DC

## 2015-07-01 NOTE — Discharge Instructions (Signed)
Please read and follow all provided instructions.  Your diagnoses today include:  1. Streptococcal pharyngitis     Tests performed today include:  Strep test: was POSITIVE for strep throat  Vital signs. See below for your results today.   Medications prescribed:   Azithromycin - antibiotic for respiratory infection  You have been prescribed an antibiotic medicine: take the entire course of medicine even if you are feeling better. Stopping early can cause the antibiotic not to work.   Take any medications prescribed only as directed.   Home care instructions:  Please read the educational materials provided and follow any instructions contained in this packet.  Follow-up instructions: Please follow-up with your primary care provider as needed for further evaluation of your symptoms.  Return instructions:   Please return to the Emergency Department if you experience worsening symptoms.   Return if you have worsening problems swallowing, your neck becomes swollen, you cannot swallow your saliva or your voice becomes muffled.   Return with high persistent fever, persistent vomiting, or if you have trouble breathing.   Please return if you have any other emergent concerns.  Additional Information:  Your vital signs today were: BP 105/78 mmHg   Pulse 92   Temp(Src) 100.2 F (37.9 C) (Oral)   Resp 18   Ht  (1.702 m)   Wt 200 lb (90.719 kg)   BMI 31.32 kg/m2   SpO2 96% If your blood pressure (BP) was elevated above 135/85 this visit, please have this repeated by your doctor within one month. --------------

## 2015-07-01 NOTE — ED Notes (Signed)
Pt wife given blanket per request. Pt requests blanket, explained that with his fever, I can only give him a sheet. Pt and wife verbalize understanding.

## 2015-07-01 NOTE — ED Provider Notes (Signed)
CSN: [id number]     Arrival date & time 07/01/15  1459 History   First MD Initiated Contact with Patient 07/01/15 1605     Chief Complaint  Patient presents with  . Sore Throat  . Fever     (Consider location/radiation/quality/duration/timing/severity/associated sxs/prior Treatment) HPI Comments: Patient with history of diabetes -- presents with complaints of fever, sore throat, chills, productive cough, body aches including lower back pain, frequent urination, burning with urination, dysgeusia for the past 2 days. No chest pain or shortness of breath. No history of UTI or prostatitis. Patient denies ear pain, runny nose. No nausea, vomiting, diarrhea, or constipation. No known sick contacts. No treatments prior to arrival. The onset of this condition was acute. The course is constant. Aggravating factors: swallowing. Alleviating factors: none.    Patient is a 54 y.o. male presenting with pharyngitis and fever. The history is provided by the patient.  Sore Throat Associated symptoms include chills, coughing, fatigue, a fever, myalgias and a sore throat. Pertinent negatives include no abdominal pain, congestion, headaches, nausea, rash or vomiting.  Fever Associated symptoms: chills, cough, dysuria, myalgias and sore throat   Associated symptoms: no congestion, no diarrhea, no ear pain, no headaches, no nausea, no rash, no rhinorrhea and no vomiting     Past Medical History  Diagnosis Date  . Epididymal pain     LEFT  . Hypertension   . Asthma   . Epilepsy, 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 22---  LAST SEIZURE 1 WK AGO (APPROX ,  10-31-2013)    NO NEUROLOGIST---  PT SEES PCP  DR Lindajo Royal  . Migraine   . Type 2 diabetes mellitus   . Hyperthyroidism     NO MEDS   . GERD (gastroesophageal reflux disease)   . Gastric ulcer   . Frequency of urination   . Feeling of incomplete bladder emptying   . Borderline glaucoma   . Bipolar 1 disorder    Past Surgical History  Procedure Laterality Date  .  Anterior cervical decomp/discectomy fusion  2007    C4  --  C6  . Testicle removal Left   . Multiple cyst removed from chest  AGE 27  . Excision lipoma left shoulder  2004 (APPROX)  . Epididymectomy Left 11/09/2013    Procedure:  LEFT EPIDIDYMECTOMY;  Surgeon: Bjorn Pippin, MD;  [facility];  Service: Urology;  Laterality: Left;  POSSIBLE OUTPATIENT WITH OBSERVATION  . Cystoscopy N/A 11/09/2013    Procedure: CYSTOSCOPY FLEXIBLE;  Surgeon: Bjorn Pippin, MD;  Location: [facility];  Service: Urology;  Laterality: N/A;  . Cervical fusion     No family history on file. History  Substance Use Topics  . Smoking status: Former Smoker -- 0.25 packs/day for 15 years    Types: Cigarettes    Quit date: 11/08/1992  . Smokeless tobacco: Never Used  . Alcohol Use: No    Review of Systems  Constitutional: Positive for fever, chills and fatigue.  HENT: Positive for sore throat. Negative for congestion, ear pain, rhinorrhea and sinus pressure.   Eyes: Negative for redness.  Respiratory: Positive for cough. Negative for shortness of breath and wheezing.   Gastrointestinal: Negative for nausea, vomiting, abdominal pain and diarrhea.  Genitourinary: Positive for dysuria. Negative for hematuria.  Musculoskeletal: Positive for myalgias and back pain. Negative for neck stiffness.  Skin: Negative for rash.  Neurological: Negative for headaches.  Hematological: Negative for adenopathy.      Allergies  Nsaids; Tramadol; Amoxicillin;  Ampicillin; Dilantin; Penicillins; Risperidone and related; Strawberry; Bactrim; and Oatmeal  Home Medications   Prior to Admission medications   Medication Sig Start Date End Date Taking? Authorizing Provider  fluticasone-salmeterol (ADVAIR HFA) 115-21 MCG/ACT inhaler Inhale 2 puffs into the lungs daily.   Yes Historical Provider, MD  LATUDA 60 MG TABS Take 60 mg by mouth daily. 10/03/14  Yes Historical Provider, MD  metFORMIN  (GLUCOPHAGE) 500 MG tablet Take 1,000 mg by mouth 2 (two) times daily with a meal.    Yes Historical Provider, MD  omeprazole (PRILOSEC) 20 MG capsule Take 1 capsule (20 mg total) by mouth 2 (two) times daily. 11/08/14  Yes [name], MD  topiramate (TOPAMAX) 100 MG tablet Take 300 mg by mouth every evening.   Yes Historical Provider, MD  divalproex (DEPAKOTE) 500 MG DR tablet Take 500-1,000 mg by mouth 2 (two) times daily. Take 500mg  in the AM and 1000mg  in the PM.    Historical Provider, MD  doxycycline (VIBRAMYCIN) 100 MG capsule Take 1 capsule (100 mg total) by mouth 2 (two) times daily. 11/20/13   Gwyneth Sprout, MD  oxyCODONE (ROXICODONE) 5 MG immediate release tablet Take 1 tablet (5 mg total) by mouth every 4 (four) hours as needed for severe pain. Patient not taking: Reported on 11/08/2014 11/10/13   Bjorn Pippin, MD  oxyCODONE-acetaminophen (PERCOCET/ROXICET) 5-325 MG per tablet Take 1 tablet by mouth every 6 (six) hours as needed. Patient not taking: Reported on 11/08/2014 11/20/13   Gwyneth Sprout, MD  oxyCODONE-acetaminophen (ROXICET) 5-325 MG per tablet Take 1 tablet by mouth every 4 (four) hours as needed for severe pain. Patient not taking: Reported on 11/08/2014 11/09/13   Bjorn Pippin, MD   BP 110/68 mmHg  Pulse 102  Temp(Src) 100.9 F (38.3 C) (Oral)  Resp 18  Ht 5\' 7"  (1.702 m)  Wt 200 lb (90.719 kg)  BMI 31.32 kg/m2  SpO2 96% Physical Exam  Constitutional: He appears well-developed and well-nourished.  HENT:  Head: Normocephalic and atraumatic.  Right Ear: Tympanic membrane, external ear and ear canal normal.  Left Ear: Tympanic membrane, external ear and ear canal normal.  Nose: Nose normal. No mucosal edema or rhinorrhea.  Mouth/Throat: Uvula is midline and mucous membranes are normal. Mucous membranes are not dry. No trismus in the jaw. No uvula swelling. Posterior oropharyngeal edema and posterior oropharyngeal erythema present. No oropharyngeal exudate or tonsillar  abscesses.  Eyes: Conjunctivae are normal. Right eye exhibits no discharge. Left eye exhibits no discharge.  Neck: Normal range of motion. Neck supple.  Cardiovascular: Normal rate, regular rhythm and normal heart sounds.   No murmur heard. Pulmonary/Chest: Effort normal and breath sounds normal. No respiratory distress. He has no wheezes. He has no rales.  Abdominal: Soft. There is no tenderness. There is no rebound, no guarding and no CVA tenderness.  Musculoskeletal: He exhibits no edema or tenderness.       Cervical back: Normal.       Thoracic back: Normal.       Lumbar back: He exhibits normal range of motion and no bony tenderness.  Lymphadenopathy:    He has cervical adenopathy.  Neurological: He is alert.  Skin: Skin is warm and dry.  Psychiatric: He has a normal mood and affect.  Nursing note and vitals reviewed.   ED Course  Procedures (including critical care time) Labs Review Labs Reviewed  RAPID STREP SCREEN (NOT AT [facility]) - Abnormal; Notable for the following:    Streptococcus, Group A  Screen (Direct) POSITIVE (*)    All other components within normal limits  URINALYSIS, ROUTINE W REFLEX MICROSCOPIC (NOT AT [facility]) - Abnormal; Notable for the following:    Glucose, UA 100 (*)    Protein, ur 30 (*)    All other components within normal limits  URINE MICROSCOPIC-ADD ON - Abnormal; Notable for the following:    Bacteria, UA FEW (*)    All other components within normal limits    Imaging Review Dg Chest 2 View  07/01/2015   CLINICAL DATA:  Initial encounter for 2 day history of sore throat cough and chest pain with fever.  EXAM: CHEST  2 VIEW  COMPARISON:  02/24/2015.  FINDINGS: Two views study shows no focal airspace consolidation, pulmonary edema, or pleural effusion. There is some probable trace atelectasis in lung bases. The cardiopericardial silhouette is within normal limits for size. Imaged bony structures of the thorax are intact.  IMPRESSION: Trace basilar  atelectasis. Otherwise no acute cardiopulmonary findings.   Electronically Signed   By: Kennith Center M.D.   On: 07/01/2015 16:42     EKG Interpretation None       4:15 PM Patient seen and examined. Work-up initiated. Medications ordered.   Vital signs reviewed and are as follows: BP 110/68 mmHg  Pulse 102  Temp(Src) 100.9 F (38.3 C) (Oral)  Resp 18  Ht  (1.702 m)  Wt 200 lb (90.719 kg)  BMI 31.32 kg/m2  SpO2 96%  5:25 PM Strep POSITIVE. Pt informed of results.   Patient counseled on supportive care and s/s to return including worsening symptoms, persistent fever, persistent vomiting, or if they have any other concerns. Urged to see PCP if symptoms persist for more than 3 days. Patient verbalizes understanding and agrees with plan.    MDM   Final diagnoses:  Streptococcal pharyngitis   Patient with multiple sx, worst sx is sore throat. Strep pos. No clinical PTA or deep space neck infection. No signs of epiglottis. He is diabetic. UA and CXR checked and are neg. Pt appears well and non-toxic. Do not feel that further work-up indicated. Azithro given. Otherwise symptomatic care. No dangerous or life-threatening conditions suspected or identified by history, physical exam, and by work-up. No indications for hospitalization identified.    Renne Crigler, PA-C 07/01/15 1727  Blake Divine, MD 07/01/15 [phone]

## 2015-07-01 NOTE — ED Notes (Signed)
Patient transported to X-ray 

## 2015-07-01 NOTE — ED Notes (Signed)
Nurse first-pt was assisted from car to ED lobby via w/c-pt able to stand, take steps and pivot to sit in w/c

## 2015-07-01 NOTE — ED Notes (Addendum)
Family sts pt awoke this morning with sore throat, fever, chills and productive cough. Pt also c/o generalized body aches and bad taste in his mouth.

## 2015-08-29 ENCOUNTER — Encounter (HOSPITAL_BASED_OUTPATIENT_CLINIC_OR_DEPARTMENT_OTHER): Payer: Self-pay | Admitting: *Deleted

## 2015-08-29 ENCOUNTER — Emergency Department (HOSPITAL_BASED_OUTPATIENT_CLINIC_OR_DEPARTMENT_OTHER)
Admission: EM | Admit: 2015-08-29 | Discharge: 2015-08-29 | Disposition: A | Discharge status: Home / Self Care | Payer: Medicare Other | Attending: Emergency Medicine | Admitting: Emergency Medicine

## 2015-08-29 DIAGNOSIS — Z880 Allergy status to penicillin: Secondary | ICD-10-CM | POA: Insufficient documentation

## 2015-08-29 DIAGNOSIS — J45909 Unspecified asthma, uncomplicated: Secondary | ICD-10-CM | POA: Insufficient documentation

## 2015-08-29 DIAGNOSIS — Z87891 Personal history of nicotine dependence: Secondary | ICD-10-CM | POA: Insufficient documentation

## 2015-08-29 DIAGNOSIS — G43909 Migraine, unspecified, not intractable, without status migrainosus: Secondary | ICD-10-CM | POA: Diagnosis not present

## 2015-08-29 DIAGNOSIS — I10 Essential (primary) hypertension: Secondary | ICD-10-CM | POA: Diagnosis not present

## 2015-08-29 DIAGNOSIS — Z792 Long term (current) use of antibiotics: Secondary | ICD-10-CM | POA: Diagnosis not present

## 2015-08-29 DIAGNOSIS — E119 Type 2 diabetes mellitus without complications: Secondary | ICD-10-CM | POA: Diagnosis not present

## 2015-08-29 DIAGNOSIS — Z79899 Other long term (current) drug therapy: Secondary | ICD-10-CM | POA: Insufficient documentation

## 2015-08-29 DIAGNOSIS — G40909 Epilepsy, unspecified, not intractable, without status epilepticus: Secondary | ICD-10-CM | POA: Insufficient documentation

## 2015-08-29 DIAGNOSIS — R1013 Epigastric pain: Secondary | ICD-10-CM | POA: Diagnosis not present

## 2015-08-29 DIAGNOSIS — K219 Gastro-esophageal reflux disease without esophagitis: Secondary | ICD-10-CM | POA: Diagnosis not present

## 2015-08-29 DIAGNOSIS — R1012 Left upper quadrant pain: Secondary | ICD-10-CM | POA: Diagnosis not present

## 2015-08-29 DIAGNOSIS — Z87438 Personal history of other diseases of male genital organs: Secondary | ICD-10-CM | POA: Diagnosis not present

## 2015-08-29 DIAGNOSIS — R112 Nausea with vomiting, unspecified: Secondary | ICD-10-CM | POA: Diagnosis not present

## 2015-08-29 DIAGNOSIS — Z8659 Personal history of other mental and behavioral disorders: Secondary | ICD-10-CM | POA: Diagnosis not present

## 2015-08-29 DIAGNOSIS — R197 Diarrhea, unspecified: Secondary | ICD-10-CM | POA: Diagnosis not present

## 2015-08-29 LAB — CBC WITH DIFFERENTIAL/PLATELET
Basophils Absolute: 0 10*3/uL (ref 0.0–0.1)
Basophils Relative: 0 %
Eosinophils Absolute: 0.1 10*3/uL (ref 0.0–0.7)
Eosinophils Relative: 1 %
HCT: 44 % (ref 39.0–52.0)
Hemoglobin: 14.5 g/dL (ref 13.0–17.0)
Lymphocytes Relative: 26 %
Lymphs Abs: 1.7 10*3/uL (ref 0.7–4.0)
MCH: 27.5 pg (ref 26.0–34.0)
MCHC: 33 g/dL (ref 30.0–36.0)
MCV: 83.3 fL (ref 78.0–100.0)
Monocytes Absolute: 0.4 10*3/uL (ref 0.1–1.0)
Monocytes Relative: 6 %
Neutro Abs: 4.5 10*3/uL (ref 1.7–7.7)
Neutrophils Relative %: 67 %
Platelets: 167 10*3/uL (ref 150–400)
RBC: 5.28 MIL/uL (ref 4.22–5.81)
RDW: 15.2 % (ref 11.5–15.5)
WBC: 6.7 10*3/uL (ref 4.0–10.5)

## 2015-08-29 LAB — URINALYSIS, ROUTINE W REFLEX MICROSCOPIC
Bilirubin Urine: NEGATIVE
Glucose, UA: 100 mg/dL — AB
Hgb urine dipstick: NEGATIVE
Ketones, ur: NEGATIVE mg/dL
Leukocytes, UA: NEGATIVE
Nitrite: NEGATIVE
Protein, ur: NEGATIVE mg/dL
Specific Gravity, Urine: 1.028 (ref 1.005–1.030)
Urobilinogen, UA: 0.2 mg/dL (ref 0.0–1.0)
pH: 6 (ref 5.0–8.0)

## 2015-08-29 LAB — HEPATIC FUNCTION PANEL
ALT: 42 U/L (ref 17–63)
AST: 36 U/L (ref 15–41)
Albumin: 4.4 g/dL (ref 3.5–5.0)
Alkaline Phosphatase: 61 U/L (ref 38–126)
Bilirubin, Direct: 0.1 mg/dL (ref 0.1–0.5)
Indirect Bilirubin: 0.4 mg/dL (ref 0.3–0.9)
Total Bilirubin: 0.5 mg/dL (ref 0.3–1.2)
Total Protein: 8 g/dL (ref 6.5–8.1)

## 2015-08-29 LAB — BASIC METABOLIC PANEL
Anion gap: 8 (ref 5–15)
BUN: 22 mg/dL — ABNORMAL HIGH (ref 6–20)
CO2: 19 mmol/L — ABNORMAL LOW (ref 22–32)
Calcium: 9.2 mg/dL (ref 8.9–10.3)
Chloride: 114 mmol/L — ABNORMAL HIGH (ref 101–111)
Creatinine, Ser: 1.31 mg/dL — ABNORMAL HIGH (ref 0.61–1.24)
GFR calc Af Amer: >60 mL/min (ref >60)
GFR calc non Af Amer: >60 mL/min (ref >60)
Glucose, Bld: 106 mg/dL — ABNORMAL HIGH (ref 65–99)
Potassium: 3.7 mmol/L (ref 3.5–5.1)
Sodium: 141 mmol/L (ref 135–145)

## 2015-08-29 LAB — LIPASE, BLOOD: Lipase: 28 U/L (ref 22–51)

## 2015-08-29 MED ORDER — FAMOTIDINE IN NACL 20-0.9 MG/50ML-% IV SOLN
20.0000 mg | Freq: Once | INTRAVENOUS | Status: AC
  Administered 2015-08-29: 20 mg via INTRAVENOUS
  Filled 2015-08-29: qty 50

## 2015-08-29 MED ORDER — HYDROCODONE-ACETAMINOPHEN 5-325 MG PO TABS: ORAL_TABLET | ORAL | Status: DC

## 2015-08-29 MED ORDER — DICYCLOMINE HCL 10 MG PO CAPS
10.0000 mg | ORAL_CAPSULE | Freq: Once | ORAL | Status: AC
  Administered 2015-08-29: 10 mg via ORAL
  Filled 2015-08-29: qty 1

## 2015-08-29 MED ORDER — ONDANSETRON HCL 4 MG PO TABS: 4.0000 mg | ORAL_TABLET | Freq: Three times a day (TID) | ORAL | Status: DC | PRN

## 2015-08-29 MED ORDER — DICYCLOMINE HCL 20 MG PO TABS: 20.0000 mg | ORAL_TABLET | Freq: Two times a day (BID) | ORAL | Status: DC

## 2015-08-29 MED ORDER — ONDANSETRON HCL 4 MG/2ML IJ SOLN
4.0000 mg | Freq: Once | INTRAMUSCULAR | Status: AC
  Administered 2015-08-29: 4 mg via INTRAVENOUS
  Filled 2015-08-29: qty 2

## 2015-08-29 MED ORDER — MORPHINE SULFATE (PF) 4 MG/ML IV SOLN
4.0000 mg | Freq: Once | INTRAVENOUS | Status: AC
  Administered 2015-08-29: 4 mg via INTRAVENOUS
  Filled 2015-08-29: qty 1

## 2015-08-29 MED ORDER — SUCRALFATE 1 GM/10ML PO SUSP: 1.0000 g | Freq: Three times a day (TID) | ORAL | Status: DC

## 2015-08-29 MED ORDER — SODIUM CHLORIDE 0.9 % IV BOLUS (SEPSIS)
1000.0000 mL | Freq: Once | INTRAVENOUS | Status: AC
  Administered 2015-08-29: 1000 mL via INTRAVENOUS

## 2015-08-29 NOTE — Discharge Instructions (Signed)
Push fluids: take small frequent sips of water or Gatorade, do not drink any soda, juice or caffeinated beverages.    Slowly resume solid diet as desired. Avoid food that are spicy, contain dairy and/or have high fat content.  Aviod NSAIDs (aspirin, motrin, ibuprofen, naproxen, Aleve et Karie Soda) for pain control because they will irritate your stomach.  Return to the emergency room for severely worsening abdominal pain, abdominal pain that localizes to a particular area (especially the right lower part of the belly), pain that persists past 8-10 hours, blood in stool or vomit, severe weakness, fainting, or fever.   Please follow with your primary care doctor in the next 2 days for a check-up. They must obtain records for further management.   Do not hesitate to return to the Emergency Department for any new, worsening or concerning symptoms.

## 2015-08-29 NOTE — ED Provider Notes (Signed)
CSN: [id number]     Arrival date & time 08/29/15  1110 History   First MD Initiated Contact with Patient 08/29/15 1202     Chief Complaint  Patient presents with  . Abdominal Pain     (Consider location/radiation/quality/duration/timing/severity/associated sxs/prior Treatment) HPI   Blood pressure 113/76, pulse 84, temperature 97.8 F (36.6 C), temperature source Oral, resp. rate 18, height  (1.702 m), weight 200 lb (90.719 kg), SpO2 97 %.  Nicholas [name] is a 54 y.o. male medical history significant for non-insulin-dependent diabetes, gastric ulcer, pseudoseizure and hyperthyroid complaining of multiple episodes of nonbloody, nonbilious, no coffee-ground emesis with associated loose stool onset yesterday morning. Patient denies melena, hematochezia, fever, chills, change in urination, recent travel, sick contacts, new medications, chest pain, shortness of breath, cough. He endorses a 9 out of 10 epigastric and left upper quadrant pain with no exacerbating or alleviating symptoms identified. Patient does not take NSAIDs, he is compliant with his Prilosec. Has not been checking his blood sugar because his glucometer was broken in the move. He does have a prescription for a new glucometer.  She denies excessive NSAID use, does not drink alcohol.  Past Medical History  Diagnosis Date  . Epididymal pain     LEFT  . Hypertension   . Asthma   . 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 Migraine   . Type 2 diabetes mellitus   . Hyperthyroidism     NO MEDS   . GERD (gastroesophageal reflux disease)   . Gastric ulcer   . Frequency of urination   . Feeling of incomplete bladder emptying   . Borderline glaucoma   . Bipolar 1 disorder    Past Surgical History  Procedure Laterality Date  . Anterior cervical decomp/discectomy fusion  2007    C4  --  C6  . Testicle removal Left   . Multiple cyst  removed from chest  AGE 15  . Excision lipoma left shoulder  2004 (APPROX)  . Epididymectomy Left 11/09/2013    Procedure:  LEFT EPIDIDYMECTOMY;  Surgeon: Bjorn Pippin, MD;  Location: [facility];  Service: Urology;  Laterality: Left;  POSSIBLE OUTPATIENT WITH OBSERVATION  . Cystoscopy N/A 11/09/2013    Procedure: CYSTOSCOPY FLEXIBLE;  Surgeon: Bjorn Pippin, MD;  Location: [facility];  Service: Urology;  Laterality: N/A;  . Cervical fusion     No family history on file. Social History  Substance Use Topics  . Smoking status: Former Smoker -- 0.25 packs/day for 15 years    Types: Cigarettes    Quit date: 11/08/1992  . Smokeless tobacco: Never Used  . Alcohol Use: No    Review of Systems  10 systems reviewed and found to be negative, except as noted in the HPI.  Allergies  Nsaids; Tramadol; Amoxicillin; Ampicillin; Dilantin; Penicillins; Risperidone and related; Strawberry; Bactrim; and Oatmeal  Home Medications   Prior to Admission medications   Medication Sig Start Date End Date Taking? Authorizing Provider  SITagliptin Phosphate (JANUVIA PO) Take by mouth.   Yes Historical Provider, MD  azithromycin (ZITHROMAX) 250 MG tablet Take 1 tablet (250 mg total) by mouth daily. 07/01/15   Renne Crigler, PA-C  dicyclomine (BENTYL) 20 MG tablet Take 1 tablet (20 mg total) by mouth 2 (two) times daily. 08/29/15   Nicole [name], PA-C  divalproex (DEPAKOTE) 500 MG  DR tablet Take 500-1,000 mg by mouth 2 (two) times daily. Take  in the AM and  in the PM.    Historical Provider, MD  doxycycline (VIBRAMYCIN) 100 MG capsule Take 1 capsule (100 mg total) by mouth 2 (two) times daily. 11/20/13   Gwyneth Sprout, MD  fluticasone-salmeterol (ADVAIR HFA) 696-29 MCG/ACT inhaler Inhale 2 puffs into the lungs daily.    Historical Provider, MD  HYDROcodone-acetaminophen (NORCO/VICODIN) 5-325 MG per tablet Take 1-2 tablets by mouth every 6 hours as needed for pain and/or  cough. 08/29/15   Nicole [name], PA-C  LATUDA 60 MG TABS Take 60 mg by mouth daily. 10/03/14   Historical Provider, MD  metFORMIN (GLUCOPHAGE) 500 MG tablet Take 1,000 mg by mouth 2 (two) times daily with a meal.     Historical Provider, MD  omeprazole (PRILOSEC) 20 MG capsule Take 1 capsule (20 mg total) by mouth 2 (two) times daily. 11/08/14   [name], MD  ondansetron (ZOFRAN) 4 MG tablet Take 1 tablet (4 mg total) by mouth every 8 (eight) hours as needed for nausea or vomiting. 08/29/15   Joni Reining [name], PA-C  sucralfate (CARAFATE) 1 GM/10ML suspension Take 10 mLs (1 g total) by mouth 4 (four) times daily -  with meals and at bedtime. 08/29/15   Nicole [name], PA-C  topiramate (TOPAMAX) 100 MG tablet Take 300 mg by mouth every evening.    Historical Provider, MD   BP 102/67 mmHg  Pulse 77  Temp(Src) 97.8 F (36.6 C) (Oral)  Resp 18  Ht  (1.702 m)  Wt 200 lb (90.719 kg)  BMI 31.32 kg/m2  SpO2 95% Physical Exam  Constitutional: He is oriented to person, place, and time. He appears well-developed and well-nourished. No distress.  HENT:  Head: Normocephalic and atraumatic.  Mouth/Throat: Oropharynx is clear and moist.  Eyes: Conjunctivae and EOM are normal.  Cardiovascular: Normal rate, regular rhythm and intact distal pulses.   Pulmonary/Chest: Effort normal and breath sounds normal. No stridor. No respiratory distress. He has no wheezes. He has no rales. He exhibits no tenderness.  Abdominal: Soft. Bowel sounds are normal. He exhibits no distension and no mass. There is tenderness. There is no rebound and no guarding.  Mild epigastric and left upper quadrant pain. No distention, normoactive bowel sounds, no peritoneal signs.  Musculoskeletal: Normal range of motion.  Neurological: He is alert and oriented to person, place, and time.  Psychiatric: He has a normal mood and affect.  Nursing note and vitals reviewed.   ED Course  Procedures (including critical care  time) Labs Review Labs Reviewed  BASIC METABOLIC PANEL - Abnormal; Notable for the following:    Chloride 114 (*)    CO2 19 (*)    Glucose, Bld 106 (*)    BUN 22 (*)    Creatinine, Ser 1.31 (*)    All other components within normal limits  URINALYSIS, ROUTINE W REFLEX MICROSCOPIC (NOT AT [facility]) - Abnormal; Notable for the following:    Glucose, UA 100 (*)    All other components within normal limits  CBC WITH DIFFERENTIAL/PLATELET  LIPASE, BLOOD  HEPATIC FUNCTION PANEL    Imaging Review No results found. I have personally reviewed and evaluated these images and lab results as part of my medical decision-making.   EKG Interpretation None      MDM   Final diagnoses:  Nausea vomiting and diarrhea  Epigastric pain    Filed Vitals:   08/29/15 1118 08/29/15 1229  BP:  113/76 102/67  Pulse: 84 77  Temp: 97.8 F (36.6 C)   TempSrc: Oral   Resp: 18   Height:  (1.702 m)   Weight: 200 lb (90.719 kg)   SpO2: 97% 95%    Medications  sodium chloride 0.9 % bolus 1,000 mL (1,000 mLs Intravenous New Bag/Given 08/29/15 1227)  famotidine (PEPCID) IVPB 20 mg premix (20 mg Intravenous New Bag/Given 08/29/15 1238)  morphine 4 MG/ML injection 4 mg (4 mg Intravenous Given 08/29/15 1232)  ondansetron (ZOFRAN) injection 4 mg (4 mg Intravenous Given 08/29/15 1228)  dicyclomine (BENTYL) capsule 10 mg (10 mg Oral Given 08/29/15 1249)    DIOGO ANNE is a pleasant 54 y.o. male presenting with nausea vomiting diarrhea and burning sensation in the epigastrium. Abdominal exam is benign. Patient appears grossly hydrated. Patient has an elevated creatinine, will bolus, check LFTs and lipase, give Pepcid and Zofran reassess.  Blood work reassuring aside from elevated creatinine likely secondary to dehydration. Repeat abdominal exam remains benign. Patient reports significant subjective improvement. Patient is tolerating by mouth and we discussed return precautions and infection control  techniques.  Evaluation does not show pathology that would require ongoing emergent intervention or inpatient treatment. Pt is hemodynamically stable and mentating appropriately. Discussed findings and plan with patient/guardian, who agrees with care plan. All questions answered. Return precautions discussed and outpatient follow up given.   New Prescriptions   DICYCLOMINE (BENTYL) 20 MG TABLET    Take 1 tablet (20 mg total) by mouth 2 (two) times daily.   HYDROCODONE-ACETAMINOPHEN (NORCO/VICODIN) 5-325 MG PER TABLET    Take 1-2 tablets by mouth every 6 hours as needed for pain and/or cough.   ONDANSETRON (ZOFRAN) 4 MG TABLET    Take 1 tablet (4 mg total) by mouth every 8 (eight) hours as needed for nausea or vomiting.   SUCRALFATE (CARAFATE) 1 GM/10ML SUSPENSION    Take 10 mLs (1 g total) by mouth 4 (four) times daily -  with meals and at bedtime.         Wynetta Emery, PA-C 08/29/15 1348  Jerelyn Scott, MD 08/29/15 1352

## 2015-08-29 NOTE — ED Notes (Signed)
Woke yesterday with abdominal pain, diarrhea and vomiting.

## 2015-09-05 ENCOUNTER — Emergency Department (HOSPITAL_BASED_OUTPATIENT_CLINIC_OR_DEPARTMENT_OTHER)
Admission: EM | Admit: 2015-09-05 | Discharge: 2015-09-05 | Disposition: A | Discharge status: Home / Self Care | Payer: Medicare Other | Attending: Emergency Medicine | Admitting: Emergency Medicine

## 2015-09-05 ENCOUNTER — Encounter (HOSPITAL_BASED_OUTPATIENT_CLINIC_OR_DEPARTMENT_OTHER): Payer: Self-pay

## 2015-09-05 DIAGNOSIS — G43909 Migraine, unspecified, not intractable, without status migrainosus: Secondary | ICD-10-CM | POA: Diagnosis not present

## 2015-09-05 DIAGNOSIS — Z880 Allergy status to penicillin: Secondary | ICD-10-CM | POA: Insufficient documentation

## 2015-09-05 DIAGNOSIS — J45909 Unspecified asthma, uncomplicated: Secondary | ICD-10-CM | POA: Insufficient documentation

## 2015-09-05 DIAGNOSIS — Z7951 Long term (current) use of inhaled steroids: Secondary | ICD-10-CM | POA: Diagnosis not present

## 2015-09-05 DIAGNOSIS — G40909 Epilepsy, unspecified, not intractable, without status epilepticus: Secondary | ICD-10-CM | POA: Insufficient documentation

## 2015-09-05 DIAGNOSIS — K219 Gastro-esophageal reflux disease without esophagitis: Secondary | ICD-10-CM | POA: Diagnosis not present

## 2015-09-05 DIAGNOSIS — Z8659 Personal history of other mental and behavioral disorders: Secondary | ICD-10-CM | POA: Diagnosis not present

## 2015-09-05 DIAGNOSIS — I10 Essential (primary) hypertension: Secondary | ICD-10-CM | POA: Insufficient documentation

## 2015-09-05 DIAGNOSIS — E119 Type 2 diabetes mellitus without complications: Secondary | ICD-10-CM | POA: Insufficient documentation

## 2015-09-05 DIAGNOSIS — Z87891 Personal history of nicotine dependence: Secondary | ICD-10-CM | POA: Insufficient documentation

## 2015-09-05 DIAGNOSIS — Z79899 Other long term (current) drug therapy: Secondary | ICD-10-CM | POA: Insufficient documentation

## 2015-09-05 DIAGNOSIS — J029 Acute pharyngitis, unspecified: Secondary | ICD-10-CM | POA: Diagnosis present

## 2015-09-05 LAB — RAPID STREP SCREEN (MED CTR MEBANE ONLY): Streptococcus, Group A Screen (Direct): NEGATIVE

## 2015-09-05 MED ORDER — KETOROLAC TROMETHAMINE 60 MG/2ML IM SOLN
60.0000 mg | Freq: Once | INTRAMUSCULAR | Status: AC
  Administered 2015-09-05: 60 mg via INTRAMUSCULAR

## 2015-09-05 MED ORDER — KETOROLAC TROMETHAMINE 60 MG/2ML IM SOLN
INTRAMUSCULAR | Status: AC
  Filled 2015-09-05: qty 2

## 2015-09-05 NOTE — ED Provider Notes (Signed)
CSN: [id number]     Arrival date & time 09/05/15  1122 History   First MD Initiated Contact with Patient 09/05/15 1237     Chief Complaint  Patient presents with  . Sore Throat     (Consider location/radiation/quality/duration/timing/severity/associated sxs/prior Treatment) HPI Comments: Patient with history of diabetes --  presents with complaint of sore throat for 5 days. Patient saw his primary care physician yesterday and was told that it was likely from a cough that he had experienced after starting lisinopril. Patient was taken off the lisinopril and patient was told to wait 5-10 days to see if symptoms improved. Patient has worse pain with swallowing. No fevers or other URI symptoms. Cough is nonproductive. No treatments prior to arrival. Patient states that sometimes he gets oral thrush with antibiotics. He states that about 2 weeks ago he had an area that he thought was thrush that resolved with gargling. Nothing since then. Patient states that his blood sugars have been running in the 100s and is been much better controlled since he started taking Januvia. No weight loss, N/V/D, blood in stool. No known sick contacts.   The history is provided by the patient.    Past Medical History  Diagnosis Date  . Epididymal pain     LEFT  . Hypertension   . Asthma   . Epilepsy, 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 54---  LAST SEIZURE 1 WK AGO (APPROX ,  10-31-2013)    NO NEUROLOGIST---  PT SEES PCP  DR Lindajo Royal  . Migraine   . Type 2 diabetes mellitus   . Hyperthyroidism     NO MEDS   . GERD (gastroesophageal reflux disease)   . Gastric ulcer   . Frequency of urination   . Feeling of incomplete bladder emptying   . Borderline glaucoma   . Bipolar 1 disorder    Past Surgical History  Procedure Laterality Date  . Anterior cervical decomp/discectomy fusion  2007    C4  --  C6  . Testicle removal Left   . Multiple cyst removed from chest  AGE 54  . Excision lipoma left shoulder  2004 (APPROX)  .  Epididymectomy Left 11/09/2013    Procedure:  LEFT EPIDIDYMECTOMY;  Surgeon: Bjorn Pippin, MD;  Location: [facility];  Service: Urology;  Laterality: Left;  POSSIBLE OUTPATIENT WITH OBSERVATION  . Cystoscopy N/A 11/09/2013    Procedure: CYSTOSCOPY FLEXIBLE;  Surgeon: Bjorn Pippin, MD;  Location: [facility];  Service: Urology;  Laterality: N/A;  . Cervical fusion     No family history on file. Social History  Substance Use Topics  . Smoking status: Former Smoker -- 0.25 packs/day for 15 years    Types: Cigarettes    Quit date: 11/08/1992  . Smokeless tobacco: Never Used  . Alcohol Use: No    Review of Systems  Constitutional: Negative for fever, chills and fatigue.  HENT: Positive for sore throat and trouble swallowing (painful swallowing). Negative for congestion, ear pain, rhinorrhea, sinus pressure and voice change.   Eyes: Negative for redness.  Respiratory: Positive for cough. Negative for wheezing.   Gastrointestinal: Negative for nausea, vomiting, abdominal pain and diarrhea.  Genitourinary: Negative for dysuria.  Musculoskeletal: Negative for myalgias and neck stiffness.  Skin: Negative for rash.  Neurological: Negative for headaches.  Hematological: Negative for adenopathy.      Allergies  Nsaids; Tramadol; Amoxicillin; Ampicillin; Dilantin; Penicillins; Risperidone and related; Strawberry; Bactrim; and Oatmeal  Home Medications   Prior to Admission  medications   Medication Sig Start Date End Date Taking? Authorizing Provider  dicyclomine (BENTYL) 20 MG tablet Take 1 tablet (20 mg total) by mouth 2 (two) times daily. 08/29/15   Nicole [name], PA-C  fluticasone-salmeterol (ADVAIR HFA) 161-09 MCG/ACT inhaler Inhale 2 puffs into the lungs daily.    Historical Provider, MD  HYDROcodone-acetaminophen (NORCO/VICODIN) 5-325 MG per tablet Take 1-2 tablets by mouth every 6 hours as needed for pain and/or cough. 08/29/15   Nicole [name], PA-C   LATUDA 60 MG TABS Take 60 mg by mouth daily. 10/03/14   Historical Provider, MD  omeprazole (PRILOSEC) 20 MG capsule Take 1 capsule (20 mg total) by mouth 2 (two) times daily. 11/08/14   [name], MD  ondansetron (ZOFRAN) 4 MG tablet Take 1 tablet (4 mg total) by mouth every 8 (eight) hours as needed for nausea or vomiting. 08/29/15   Joni Reining [name], PA-C  SITagliptin Phosphate (JANUVIA PO) Take by mouth.    Historical Provider, MD  sucralfate (CARAFATE) 1 GM/10ML suspension Take 10 mLs (1 g total) by mouth 4 (four) times daily -  with meals and at bedtime. 08/29/15   Nicole [name], PA-C  topiramate (TOPAMAX) 100 MG tablet Take 300 mg by mouth every evening.    Historical Provider, MD   BP 116/78 mmHg  Pulse 62  Temp(Src) 97.7 F (36.5 C) (Oral)  Resp 20  Ht  (1.702 m)  Wt 200 lb (90.719 kg)  BMI 31.32 kg/m2  SpO2 100%  Physical Exam  Constitutional: He appears well-developed and well-nourished.  HENT:  Head: Normocephalic and atraumatic.  Right Ear: Tympanic membrane, external ear and ear canal normal.  Left Ear: Tympanic membrane, external ear and ear canal normal.  Nose: Nose normal. No mucosal edema or rhinorrhea.  Mouth/Throat: Uvula is midline and mucous membranes are normal. Mucous membranes are not dry. No trismus in the jaw. No uvula swelling. Posterior oropharyngeal erythema present. No oropharyngeal exudate, posterior oropharyngeal edema or tonsillar abscesses.  No oropharyngeal candidal infection seen. Swallows normally.  Eyes: Conjunctivae are normal. Right eye exhibits no discharge. Left eye exhibits no discharge.  Neck: Normal range of motion. Neck supple.  Cardiovascular: Normal rate, regular rhythm and normal heart sounds.   No murmur heard. Pulmonary/Chest: Effort normal and breath sounds normal. No respiratory distress. He has no wheezes. He has no rales.  Abdominal: Soft. There is no tenderness.  Neurological: He is alert.  Skin: Skin is warm and dry.   Psychiatric: He has a normal mood and affect.  Nursing note and vitals reviewed.   ED Course  Procedures (including critical care time) Labs Review Labs Reviewed  RAPID STREP SCREEN (NOT AT [facility])  CULTURE, GROUP A STREP    Imaging Review No results found. I have personally reviewed and evaluated these images and lab results as part of my medical decision-making.   EKG Interpretation None       1:10 PM Patient seen and examined. Strep test is negative.  Vital signs reviewed and are as follows: BP 116/78 mmHg  Pulse 62  Temp(Src) 97.7 F (36.5 C) (Oral)  Resp 20  Ht  (1.702 m)  Wt 200 lb (90.719 kg)  BMI 31.32 kg/m2  SpO2 100%  Patient informed of strep results. Patient given IM Toradol 1 in ED. Will avoid further NSAIDs due to intolerance. Will avoid steroids due to diabetes history.  Patient counseled to continue conservative measures for the next 5 days. If he continues to have symptoms, he  needs to follow-up with his physician as a candidal infection in his throat might be possible. Patient encouraged to return to the emergency department with fever, worsening difficulty swallowing, inability to swallow, worsening severe pain, trouble breathing or shortness of breath. Patient verbalizes understanding and agrees with plan.  MDM   Final diagnoses:  Sore throat   Patient with sore throat. Strep test is negative. Patient does report history consistent with possible mild oral pharyngeal candidiasis. I do not see anything like this on his exam today. Although, symptoms do not improve as expected, esophageal candidiasis left be considered. No concern for retropharyngeal abscess given good range of motion and low suspicion for epiglottitis given his clinical picture. Patient is afebrile and nontoxic.   Renne Crigler, PA-C 09/05/15 1415  Vanetta Mulders, MD 09/06/15 [phone]

## 2015-09-05 NOTE — ED Notes (Signed)
C/o sore throat x 5 days-was seen by PCP-advised "it was raw and he didn't give me nothing"-states PCP felt was r/t to lisinopril cough

## 2015-09-05 NOTE — Discharge Instructions (Signed)
Please read and follow all provided instructions.  Your diagnoses today include:  1. Sore throat     Tests performed today include:  Strep test: was negative for strep throat  Strep culture: you will be notified if this comes back positive  Vital signs. See below for your results today.   Medications prescribed:   Tylenol - use as directed on the packaging for pain  Home care instructions:  Please read the educational materials provided and follow any instructions contained in this packet.  Follow-up instructions: Please follow-up with your primary care provider if not improved in 5 days. You may have a fungal infection in your throat if not improved by this time.  Return instructions:   Please return to the Emergency Department if you experience worsening symptoms.   Return if you have worsening problems swallowing, your neck becomes swollen, you cannot swallow your saliva or your voice becomes muffled.   Return with high persistent fever, persistent vomiting, or if you have trouble breathing.   Please return if you have any other emergent concerns.  Additional Information:  Your vital signs today were: BP 116/78 mmHg   Pulse 62   Temp(Src) 97.7 F (36.5 C) (Oral)   Resp 20   Ht  (1.702 m)   Wt 200 lb (90.719 kg)   BMI 31.32 kg/m2   SpO2 100% If your blood pressure (BP) was elevated above 135/85 this visit, please have this repeated by your doctor within one month. --------------

## 2015-09-07 LAB — CULTURE, GROUP A STREP: Strep A Culture: NEGATIVE

## 2015-10-12 ENCOUNTER — Encounter (HOSPITAL_BASED_OUTPATIENT_CLINIC_OR_DEPARTMENT_OTHER): Payer: Self-pay | Admitting: Emergency Medicine

## 2015-10-12 ENCOUNTER — Emergency Department (HOSPITAL_BASED_OUTPATIENT_CLINIC_OR_DEPARTMENT_OTHER)
Admission: EM | Admit: 2015-10-12 | Discharge: 2015-10-12 | Disposition: A | Discharge status: Home / Self Care | Payer: Medicare Other | Attending: Physician Assistant | Admitting: Physician Assistant

## 2015-10-12 DIAGNOSIS — I10 Essential (primary) hypertension: Secondary | ICD-10-CM | POA: Diagnosis not present

## 2015-10-12 DIAGNOSIS — Z87891 Personal history of nicotine dependence: Secondary | ICD-10-CM | POA: Insufficient documentation

## 2015-10-12 DIAGNOSIS — M545 Low back pain, unspecified: Secondary | ICD-10-CM

## 2015-10-12 DIAGNOSIS — R3915 Urgency of urination: Secondary | ICD-10-CM | POA: Diagnosis not present

## 2015-10-12 DIAGNOSIS — G8911 Acute pain due to trauma: Secondary | ICD-10-CM | POA: Insufficient documentation

## 2015-10-12 DIAGNOSIS — J45909 Unspecified asthma, uncomplicated: Secondary | ICD-10-CM | POA: Insufficient documentation

## 2015-10-12 DIAGNOSIS — Z87438 Personal history of other diseases of male genital organs: Secondary | ICD-10-CM | POA: Diagnosis not present

## 2015-10-12 DIAGNOSIS — G43909 Migraine, unspecified, not intractable, without status migrainosus: Secondary | ICD-10-CM | POA: Diagnosis not present

## 2015-10-12 DIAGNOSIS — F319 Bipolar disorder, unspecified: Secondary | ICD-10-CM | POA: Diagnosis not present

## 2015-10-12 DIAGNOSIS — Z7951 Long term (current) use of inhaled steroids: Secondary | ICD-10-CM | POA: Insufficient documentation

## 2015-10-12 DIAGNOSIS — G40409 Other generalized epilepsy and epileptic syndromes, not intractable, without status epilepticus: Secondary | ICD-10-CM | POA: Insufficient documentation

## 2015-10-12 DIAGNOSIS — Z79899 Other long term (current) drug therapy: Secondary | ICD-10-CM | POA: Diagnosis not present

## 2015-10-12 DIAGNOSIS — Z880 Allergy status to penicillin: Secondary | ICD-10-CM | POA: Insufficient documentation

## 2015-10-12 DIAGNOSIS — E119 Type 2 diabetes mellitus without complications: Secondary | ICD-10-CM | POA: Diagnosis not present

## 2015-10-12 DIAGNOSIS — Z760 Encounter for issue of repeat prescription: Secondary | ICD-10-CM | POA: Insufficient documentation

## 2015-10-12 DIAGNOSIS — K219 Gastro-esophageal reflux disease without esophagitis: Secondary | ICD-10-CM | POA: Diagnosis not present

## 2015-10-12 MED ORDER — OXYCODONE-ACETAMINOPHEN 5-325 MG PO TABS
1.0000 | ORAL_TABLET | Freq: Once | ORAL | Status: AC
  Administered 2015-10-12: 1 via ORAL
  Filled 2015-10-12: qty 1

## 2015-10-12 MED ORDER — OXYCODONE-ACETAMINOPHEN 5-325 MG PO TABS: 1.0000 | ORAL_TABLET | Freq: Four times a day (QID) | ORAL | Status: DC | PRN

## 2015-10-12 MED ORDER — CYCLOBENZAPRINE HCL 10 MG PO TABS
10.0000 mg | ORAL_TABLET | Freq: Once | ORAL | Status: AC
  Administered 2015-10-12: 10 mg via ORAL
  Filled 2015-10-12: qty 1

## 2015-10-12 MED ORDER — CYCLOBENZAPRINE HCL 10 MG PO TABS: 10.0000 mg | ORAL_TABLET | Freq: Two times a day (BID) | ORAL | Status: DC | PRN

## 2015-10-12 NOTE — ED Notes (Signed)
Patient states that he fell about a week ago. The patient reports that he also picked something heavy up on the 1st. The patient reports that after the falls and then picking something heavy up he reports that he felt a pinched to his lower back
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
## 2015-10-12 NOTE — Discharge Instructions (Signed)
Back Pain, Adult °Back pain is very common in adults. The cause of back pain is rarely dangerous and the pain often gets better over time. The cause of your back pain may not be known. Some common causes of back pain include: °· Strain of the muscles or ligaments supporting the spine. °· Wear and tear (degeneration) of the spinal disks. °· Arthritis. °· Direct injury to the back. °For many people, back pain may return. Since back pain is rarely dangerous, most people can learn to manage this condition on their own. °HOME CARE INSTRUCTIONS °Watch your back pain for any changes. The following actions may help to lessen any discomfort you are feeling: °· Remain active. It is stressful on your back to sit or stand in one place for long periods of time. Do not sit, drive, or stand in one place for more than 30 minutes at a time. Take short walks on even surfaces as soon as you are able. Try to increase the length of time you walk each day. °· Exercise regularly as directed by your health care provider. Exercise helps your back heal faster. It also helps avoid future injury by keeping your muscles strong and flexible. °· Do not stay in bed. Resting more than 1-2 days can delay your recovery. °· Pay attention to your body when you bend and lift. The most comfortable positions are those that put less stress on your recovering back. Always use proper lifting techniques, including: °¨ Bending your knees. °¨ Keeping the load close to your body. °¨ Avoiding twisting. °· Find a comfortable position to sleep. Use a firm mattress and lie on your side with your knees slightly bent. If you lie on your back, put a pillow under your knees. °· Avoid feeling anxious or stressed. Stress increases muscle tension and can worsen back pain. It is important to recognize when you are anxious or stressed and learn ways to manage it, such as with exercise. °· Take medicines only as directed by your health care provider. Over-the-counter  medicines to reduce pain and inflammation are often the most helpful. Your health care provider may prescribe muscle relaxant drugs. These medicines help dull your pain so you can more quickly return to your normal activities and healthy exercise. °· Apply ice to the injured area: °¨ Put ice in a plastic bag. °¨ Place a towel between your skin and the bag. °¨ Leave the ice on for 20 minutes, 2-3 times a day for the first 2-3 days. After that, ice and heat may be alternated to reduce pain and spasms. °· Maintain a healthy weight. Excess weight puts extra stress on your back and makes it difficult to maintain good posture. °SEEK MEDICAL CARE IF: °· You have pain that is not relieved with rest or medicine. °· You have increasing pain going down into the legs or buttocks. °· You have pain that does not improve in one week. °· You have night pain. °· You lose weight. °· You have a fever or chills. °SEEK IMMEDIATE MEDICAL CARE IF:  °· You develop new bowel or bladder control problems. °· You have unusual weakness or numbness in your arms or legs. °· You develop nausea or vomiting. °· You develop abdominal pain. °· You feel faint. °  °This information is not intended to replace advice given to you by your health care provider. Make sure you discuss any questions you have with your health care provider. °  °Document Released: 11/23/2005 Document Revised: 12/14/2014 Document Reviewed: 03/27/2014 °Elsevier Interactive Patient Education ©2016 [employer] ° °Back Injury Prevention °Back injuries can be very painful. They can also be difficult to heal. After having one back injury, you are more likely to injure your back again. It is important to learn how to avoid injuring or re-injuring your back. The following tips can help you to prevent a back injury. °WHAT SHOULD I KNOW ABOUT PHYSICAL FITNESS? °· Exercise for 30 minutes per day on most days of the week or as told by your doctor. Make sure to: °¨ Do aerobic exercises,  such as walking, jogging, biking, or swimming. °¨ Do exercises that increase balance and strength, such as tai chi and yoga. °¨ Do stretching exercises. This helps with flexibility. °¨ Try to develop strong belly (abdominal) muscles. Your belly muscles help to support your back. °· Stay at a healthy weight. This helps to decrease your risk of a back injury. °WHAT SHOULD I KNOW ABOUT MY DIET? °· Talk with your doctor about your overall diet. Take supplements and vitamins only as told by your doctor. °· Talk with your doctor about how much calcium and vitamin D you need each day. These nutrients help to prevent weakening of the bones (osteoporosis). °· Include good sources of calcium in your diet, such as: °¨ Dairy products. °¨ Green leafy vegetables. °¨ Products that have had calcium added to them (fortified). °· Include good sources of vitamin D in your diet, such as: °¨ Milk. °¨ Foods that have had vitamin D added to them. °WHAT SHOULD I KNOW ABOUT MY POSTURE? °· Sit up straight and stand up straight. Avoid leaning forward when you sit or hunching over when you stand. °· Choose chairs that have good low-back (lumbar) support. °· If you work at a desk, sit close to it so you do not need to lean over. Keep your chin tucked in. Keep your neck drawn back. Keep your elbows bent so your arms look like the letter "L" (right angle). °· Sit high and close to the steering wheel when you drive. Add a low-back support to your car seat, if needed. °· Avoid sitting or standing in one position for very long. Take breaks to get up, stretch, and walk around at least one time every hour. Take breaks every hour if you are driving for long periods of time. °· Sleep on your side with your knees slightly bent, or sleep on your back with a pillow under your knees. Do not lie on the front of your body to sleep. °WHAT SHOULD I KNOW ABOUT LIFTING, TWISTING, AND REACHING °Lifting and Heavy Lifting  °· Avoid heavy lifting, especially lifting  over and over again. If you must do heavy lifting: °¨ Stretch before lifting. °¨ Work slowly. °¨ Rest between lifts. °¨ Use a tool such as a cart or a dolly to move objects if one is available. °¨ Make several small trips instead of carrying one heavy load. °¨ Ask for help when you need it, especially when moving big objects. °· Follow these steps when lifting: °¨ Stand with your feet shoulder-width apart. °¨ Get as close to the object as you can. Do not pick up a heavy object that is far from your body. °¨ Use handles or lifting straps if they are available. °¨ Bend at your knees. Squat down, but keep your heels off the floor. °¨ Keep your shoulders back. Keep your chin tucked in. Keep your back straight. °¨ Lift the object slowly while you tighten the muscles in your legs, belly, and butt.   Keep the object as close to the center of your body as possible.  Follow these steps when putting down a heavy load:  Stand with your feet shoulder-width apart.  Lower the object slowly while you tighten the muscles in your legs, belly, and butt. Keep the object as close to the center of your body as possible.  Keep your shoulders back. Keep your chin tucked in. Keep your back straight.  Bend at your knees. Squat down, but keep your heels off the floor.  Use handles or lifting straps if they are available. Twisting and Reaching  Avoid lifting heavy objects above your waist.  Do not twist at your waist while you are lifting or carrying a load. If you need to turn, move your feet.  Do not bend over without bending at your knees.  Avoid reaching over your head, across a table, or for an object on a high surface.  WHAT ARE SOME OTHER TIPS?  Avoid wet floors and icy ground. Keep sidewalks clear of ice to prevent falls.   Do not sleep on a mattress that is too soft or too hard.   Keep items that you use often within easy reach.   Put heavier objects on shelves at waist level, and put lighter objects  on lower or higher shelves.  Find ways to lower your stress, such as:  Exercise.  Massage.  Relaxation techniques.  Talk with your doctor if you feel anxious or depressed. These conditions can make back pain worse.  Wear flat heel shoes with cushioned soles.  Avoid making quick (sudden) movements.  Use both shoulder straps when carrying a backpack.  Do not use any tobacco products, including cigarettes, chewing tobacco, or electronic cigarettes. If you need help quitting, ask your doctor.   This information is not intended to replace advice given to you by your health care provider. Make sure you discuss any questions you have with your health care provider.   Document Released: 05/11/2008 Document Revised: 04/09/2015 Document Reviewed: 11/27/2014 Elsevier Interactive Patient Education [employer].

## 2015-10-17 ENCOUNTER — Encounter (HOSPITAL_BASED_OUTPATIENT_CLINIC_OR_DEPARTMENT_OTHER): Payer: Self-pay | Admitting: *Deleted

## 2015-10-17 ENCOUNTER — Emergency Department (HOSPITAL_BASED_OUTPATIENT_CLINIC_OR_DEPARTMENT_OTHER)
Admission: EM | Admit: 2015-10-17 | Discharge: 2015-10-17 | Disposition: A | Discharge status: Home / Self Care | Payer: Medicare Other | Attending: Emergency Medicine | Admitting: Emergency Medicine

## 2015-10-17 DIAGNOSIS — G43909 Migraine, unspecified, not intractable, without status migrainosus: Secondary | ICD-10-CM | POA: Diagnosis not present

## 2015-10-17 DIAGNOSIS — G40909 Epilepsy, unspecified, not intractable, without status epilepticus: Secondary | ICD-10-CM | POA: Diagnosis not present

## 2015-10-17 DIAGNOSIS — Z79899 Other long term (current) drug therapy: Secondary | ICD-10-CM | POA: Insufficient documentation

## 2015-10-17 DIAGNOSIS — K219 Gastro-esophageal reflux disease without esophagitis: Secondary | ICD-10-CM | POA: Insufficient documentation

## 2015-10-17 DIAGNOSIS — Z87448 Personal history of other diseases of urinary system: Secondary | ICD-10-CM | POA: Diagnosis not present

## 2015-10-17 DIAGNOSIS — Z7951 Long term (current) use of inhaled steroids: Secondary | ICD-10-CM | POA: Insufficient documentation

## 2015-10-17 DIAGNOSIS — I10 Essential (primary) hypertension: Secondary | ICD-10-CM | POA: Insufficient documentation

## 2015-10-17 DIAGNOSIS — F319 Bipolar disorder, unspecified: Secondary | ICD-10-CM | POA: Insufficient documentation

## 2015-10-17 DIAGNOSIS — Z880 Allergy status to penicillin: Secondary | ICD-10-CM | POA: Insufficient documentation

## 2015-10-17 DIAGNOSIS — J45909 Unspecified asthma, uncomplicated: Secondary | ICD-10-CM | POA: Diagnosis not present

## 2015-10-17 DIAGNOSIS — E119 Type 2 diabetes mellitus without complications: Secondary | ICD-10-CM | POA: Diagnosis not present

## 2015-10-17 DIAGNOSIS — Z87891 Personal history of nicotine dependence: Secondary | ICD-10-CM | POA: Insufficient documentation

## 2015-10-17 NOTE — Discharge Instructions (Signed)
DASH Eating Plan °DASH stands for "Dietary Approaches to Stop Hypertension." The DASH eating plan is a healthy eating plan that has been shown to reduce high blood pressure (hypertension). Additional health benefits may include reducing the risk of type 2 diabetes mellitus, heart disease, and stroke. The DASH eating plan may also help with weight loss. °WHAT DO I NEED TO KNOW ABOUT THE DASH EATING PLAN? °For the DASH eating plan, you will follow these general guidelines: °· Choose foods with a percent daily value for sodium of less than 5% (as listed on the food label). °· Use salt-free seasonings or herbs instead of table salt or sea salt. °· Check with your health care provider or pharmacist before using salt substitutes. °· Eat lower-sodium products, often labeled as "lower sodium" or "no salt added." °· Eat fresh foods. °· Eat more vegetables, fruits, and low-fat dairy products. °· Choose whole grains. Look for the word "whole" as the first word in the ingredient list. °· Choose fish and skinless chicken or turkey more often than red meat. Limit fish, poultry, and meat to 6 oz (170 g) each day. °· Limit sweets, desserts, sugars, and sugary drinks. °· Choose heart-healthy fats. °· Limit cheese to 1 oz (28 g) per day. °· Eat more home-cooked food and less restaurant, buffet, and fast food. °· Limit fried foods. °· Cook foods using methods other than frying. °· Limit canned vegetables. If you do use them, rinse them well to decrease the sodium. °· When eating at a restaurant, ask that your food be prepared with less salt, or no salt if possible. °WHAT FOODS CAN I EAT? °Seek help from a dietitian for individual calorie needs. °Grains °Whole grain or whole wheat bread. Brown rice. Whole grain or whole wheat pasta. Quinoa, bulgur, and whole grain cereals. Low-sodium cereals. Corn or whole wheat flour tortillas. Whole grain cornbread. Whole grain crackers. Low-sodium crackers. °Vegetables °Fresh or frozen vegetables  (raw, steamed, roasted, or grilled). Low-sodium or reduced-sodium tomato and vegetable juices. Low-sodium or reduced-sodium tomato sauce and paste. Low-sodium or reduced-sodium canned vegetables.  °Fruits °All fresh, canned (in natural juice), or frozen fruits. °Meat and Other Protein Products °Ground beef (85% or leaner), grass-fed beef, or beef trimmed of fat. Skinless chicken or turkey. Ground chicken or turkey. Pork trimmed of fat. All fish and seafood. Eggs. Dried beans, peas, or lentils. Unsalted nuts and seeds. Unsalted canned beans. °Dairy °Low-fat dairy products, such as skim or 1% milk, 2% or reduced-fat cheeses, low-fat ricotta or cottage cheese, or plain low-fat yogurt. Low-sodium or reduced-sodium cheeses. °Fats and Oils °Tub margarines without trans fats. Light or reduced-fat mayonnaise and salad dressings (reduced sodium). Avocado. Safflower, olive, or canola oils. Natural peanut or almond butter. °Other °Unsalted popcorn and pretzels. °The items listed above may not be a complete list of recommended foods or beverages. Contact your dietitian for more options. °WHAT FOODS ARE NOT RECOMMENDED? °Grains °White bread. White pasta. White rice. Refined cornbread. Bagels and croissants. Crackers that contain trans fat. °Vegetables °Creamed or fried vegetables. Vegetables in a cheese sauce. Regular canned vegetables. Regular canned tomato sauce and paste. Regular tomato and vegetable juices. °Fruits °Dried fruits. Canned fruit in light or heavy syrup. Fruit juice. °Meat and Other Protein Products °Fatty cuts of meat. Ribs, chicken wings, bacon, sausage, bologna, salami, chitterlings, fatback, hot dogs, bratwurst, and packaged luncheon meats. Salted nuts and seeds. Canned beans with salt. °Dairy °Whole or 2% milk, cream, half-and-half, and cream cheese. Whole-fat or sweetened yogurt. Full-fat   cheeses or blue cheese. Nondairy creamers and whipped toppings. Processed cheese, cheese spreads, or cheese  curds. °Condiments °Onion and garlic salt, seasoned salt, table salt, and sea salt. Canned and packaged gravies. Worcestershire sauce. Tartar sauce. Barbecue sauce. Teriyaki sauce. Soy sauce, including reduced sodium. Steak sauce. Fish sauce. Oyster sauce. Cocktail sauce. Horseradish. Ketchup and mustard. Meat flavorings and tenderizers. Bouillon cubes. Hot sauce. Tabasco sauce. Marinades. Taco seasonings. Relishes. °Fats and Oils °Butter, stick margarine, lard, shortening, ghee, and bacon fat. Coconut, palm kernel, or palm oils. Regular salad dressings. °Other °Pickles and olives. Salted popcorn and pretzels. °The items listed above may not be a complete list of foods and beverages to avoid. Contact your dietitian for more information. °WHERE CAN I FIND MORE INFORMATION? °[employer], [employer]: www.nhlbi.nih.gov/health/health-topics/topics/dash/ °  °This information is not intended to replace advice given to you by your health care provider. Make sure you discuss any questions you have with your health care provider. °  °Document Released: 11/12/2011 Document Revised: 12/14/2014 Document Reviewed: 09/27/2013 °Elsevier Interactive Patient Education ©2016 [employer] ° °Hypertension °Hypertension, commonly called high blood pressure, is when the force of blood pumping through your arteries is too strong. Your arteries are the blood vessels that carry blood from your heart throughout your body. A blood pressure reading consists of a higher number over a lower number, such as 110/72. The higher number (systolic) is the pressure inside your arteries when your heart pumps. The lower number (diastolic) is the pressure inside your arteries when your heart relaxes. Ideally you want your blood pressure below 120/80. °Hypertension forces your heart to work harder to pump blood. Your arteries may become narrow or stiff. Having untreated or uncontrolled hypertension can cause heart attack, stroke, kidney  disease, and other problems. °RISK FACTORS °Some risk factors for high blood pressure are controllable. Others are not.  °Risk factors you cannot control include:  °· Race. You may be at higher risk if you are African American. °· Age. Risk increases with age. °· Gender. Men are at higher risk than women before age 45 years. After age 65, women are at higher risk than men. °Risk factors you can control include: °· Not getting enough exercise or physical activity. °· Being overweight. °· Getting too much fat, sugar, calories, or salt in your diet. °· Drinking too much alcohol. °SIGNS AND SYMPTOMS °Hypertension does not usually cause signs or symptoms. Extremely high blood pressure (hypertensive crisis) may cause headache, anxiety, shortness of breath, and nosebleed. °DIAGNOSIS °To check if you have hypertension, your health care provider will measure your blood pressure while you are seated, with your arm held at the level of your heart. It should be measured at least twice using the same arm. Certain conditions can cause a difference in blood pressure between your right and left arms. A blood pressure reading that is higher than normal on one occasion does not mean that you need treatment. If it is not clear whether you have high blood pressure, you may be asked to return on a different day to have your blood pressure checked again. Or, you may be asked to monitor your blood pressure at home for 1 or more weeks. °TREATMENT °Treating high blood pressure includes making lifestyle changes and possibly taking medicine. Living a healthy lifestyle can help lower high blood pressure. You may need to change some of your habits. °Lifestyle changes may include: °· Following the DASH diet. This diet is high in fruits, vegetables, and whole   grains. It is low in salt, red meat, and added sugars. °· Keep your sodium intake below 2,300 mg per day. °· Getting at least 30-45 minutes of aerobic exercise at least 4 times per  week. °· Losing weight if necessary. °· Not smoking. °· Limiting alcoholic beverages. °· Learning ways to reduce stress. °Your health care provider may prescribe medicine if lifestyle changes are not enough to get your blood pressure under control, and if one of the following is true: °· You are 18-59 years of age and your systolic blood pressure is above 140. °· You are 60 years of age or older, and your systolic blood pressure is above 150. °· Your diastolic blood pressure is above 90. °· You have diabetes, and your systolic blood pressure is over 140 or your diastolic blood pressure is over 90. °· You have kidney disease and your blood pressure is above 140/90. °· You have heart disease and your blood pressure is above 140/90. °Your personal target blood pressure may vary depending on your medical conditions, your age, and other factors. °HOME CARE INSTRUCTIONS °· Have your blood pressure rechecked as directed by your health care provider.   °· Take medicines only as directed by your health care provider. Follow the directions carefully. Blood pressure medicines must be taken as prescribed. The medicine does not work as well when you skip doses. Skipping doses also puts you at risk for problems. °· Do not smoke.   °· Monitor your blood pressure at home as directed by your health care provider.  °SEEK MEDICAL CARE IF:  °· You think you are having a reaction to medicines taken. °· You have recurrent headaches or feel dizzy. °· You have swelling in your ankles. °· You have trouble with your vision. °SEEK IMMEDIATE MEDICAL CARE IF: °· You develop a severe headache or confusion. °· You have unusual weakness, numbness, or feel faint. °· You have severe chest or abdominal pain. °· You vomit repeatedly. °· You have trouble breathing. °MAKE SURE YOU:  °· Understand these instructions. °· Will watch your condition. °· Will get help right away if you are not doing well or get worse. °  °This information is not intended to  replace advice given to you by your health care provider. Make sure you discuss any questions you have with your health care provider. °  °Document Released: 11/23/2005 Document Revised: 04/09/2015 Document Reviewed: 09/15/2013 °Elsevier Interactive Patient Education ©2016 Elsevier Inc. ° °

## 2015-10-17 NOTE — ED Provider Notes (Signed)
CSN: [id number]     Arrival date & time 10/17/15  0403 History   First MD Initiated Contact with Patient 10/17/15 [phone]     Chief Complaint  Patient presents with  . Hypertension     (Consider location/radiation/quality/duration/timing/severity/associated sxs/prior Treatment) HPI Patient states that he is monitoring his blood pressure and they have been running high this evening. He has several numbers recorded on an envelope and systolic pressures are ranging from 120s to 150s and the diastolic pressures are ranging from 90s to 110s. Patient states is vision is slightly blurry and he has a frontal headache. He reports he called the nurse hotline and was told to come to the emergency department. He has no chest pain or shortness of breath. No abdominal pain nausea or vomiting. The patient takes losartan 25 mg daily for blood pressure. Past Medical History  Diagnosis Date  . Epididymal pain     LEFT  . Hypertension   . Asthma   . 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 Migraine   . Type 2 diabetes mellitus (HCC)   . Hyperthyroidism     NO MEDS   . GERD (gastroesophageal reflux disease)   . Gastric ulcer   . Frequency of urination   . Feeling of incomplete bladder emptying   . Borderline glaucoma   . Bipolar 1 disorder [facility](HCC)    Past Surgical History  Procedure Laterality Date  . Anterior cervical decomp/discectomy fusion  2007    C4  --  C6  . Testicle removal Left   . Multiple cyst removed from chest  AGE 70  . Excision lipoma left shoulder  2004 (APPROX)  . Epididymectomy Left 11/09/2013    Procedure:  LEFT EPIDIDYMECTOMY;  Surgeon: Bjorn PippinJohn Wrenn, MD;  [facility] [place];  Service: Urology;  Laterality: Left;  POSSIBLE OUTPATIENT WITH OBSERVATION  . Cystoscopy N/A 11/09/2013    Procedure: CYSTOSCOPY FLEXIBLE;  Surgeon: Bjorn PippinJohn Wrenn, MD;  [facility];  Service: Urology;  Laterality: N/A;  . Cervical fusion     No family history on file. Social History  Substance Use Topics  . Smoking status: Former Smoker -- 0.25 packs/day for 15 years    Types: Cigarettes    Quit date: 11/08/1992  . Smokeless tobacco: Never Used  . Alcohol Use: No    Review of Systems 10 Systems reviewed and are negative for acute change except as noted in the HPI.    Allergies  Nsaids; Tramadol; Amoxicillin; Ampicillin; Dilantin; Penicillins; Risperidone and related; Strawberry extract; Bactrim; and Oatmeal  Home Medications   Prior to Admission medications   Medication Sig Start Date End Date Taking? Authorizing Provider  cyclobenzaprine (FLEXERIL) 10 MG tablet Take 1 tablet (10 mg total) by mouth 2 (two) times daily as needed for muscle spasms. 10/12/15   Courteney Lyn Mackuen, MD  dicyclomine (BENTYL) 20 MG tablet Take 1 tablet (20 mg total) by mouth 2 (two) times daily. 08/29/15   Nicole [name], PA-C  fluticasone-salmeterol (ADVAIR HFA) 119-14115-21 MCG/ACT inhaler Inhale 2 puffs into the lungs daily.    Historical Provider, MD  HYDROcodone-acetaminophen (NORCO/VICODIN) 5-325 MG per tablet Take 1-2 tablets by mouth every 6 hours as needed for pain and/or cough. 08/29/15   Nicole [name], PA-C  LATUDA 60 MG TABS Take 60 mg by mouth daily. 10/03/14   Historical  Provider, MD  omeprazole (PRILOSEC) 20 MG capsule Take 1 capsule (20 mg total) by mouth 2 (two) times daily. 11/08/14   [name], MD  ondansetron (ZOFRAN) 4 MG tablet Take 1 tablet (4 mg total) by mouth every 8 (eight) hours as needed for nausea or vomiting. 08/29/15   Joni Reining [name], PA-C  oxyCODONE-acetaminophen (PERCOCET/ROXICET) 5-325 MG tablet Take 1 tablet by mouth every 6 (six) hours as needed for severe pain. 10/12/15   Courteney Lyn Mackuen, MD  SITagliptin Phosphate (JANUVIA PO) Take by mouth.    Historical Provider, MD  sucralfate (CARAFATE) 1 GM/10ML suspension Take 10 mLs (1 g  total) by mouth 4 (four) times daily -  with meals and at bedtime. 08/29/15   Nicole [name], PA-C  topiramate (TOPAMAX) 100 MG tablet Take 300 mg by mouth every evening.    Historical Provider, MD   BP 99/88 mmHg  Pulse 72  Temp(Src) 97.9 F (36.6 C) (Oral)  Resp 20  Ht  (1.702 m)  Wt 198 lb (89.812 kg)  BMI 31.00 kg/m2  SpO2 96% Physical Exam  Constitutional: He is oriented to person, place, and time. He appears well-developed and well-nourished.  HENT:  Head: Normocephalic and atraumatic.  Eyes: EOM are normal. Pupils are equal, round, and reactive to light.  Neck: Neck supple.  Cardiovascular: Normal rate, regular rhythm, normal heart sounds and intact distal pulses.   Pulmonary/Chest: Effort normal and breath sounds normal.  Abdominal: Soft. Bowel sounds are normal. He exhibits no distension. There is no tenderness.  Musculoskeletal: Normal range of motion. He exhibits no edema.  Neurological: He is alert and oriented to person, place, and time. He has normal strength. Coordination normal. GCS eye subscore is 4. GCS verbal subscore is 5. GCS motor subscore is 6.  Skin: Skin is warm, dry and intact.  Psychiatric: He has a normal mood and affect.    ED Course  Procedures (including critical care time) Labs Review Labs Reviewed - No data to display  Imaging Review No results found. I have personally reviewed and evaluated these images and lab results as part of my medical decision-making.   EKG Interpretation None      MDM   Final diagnoses:  Essential hypertension   Patient is well in appearance. Blood pressure monitored in the emergency department are normotensive. At this time we could not reasonably increase the patient's medication are at a second medication. Patient is advised to follow-up with his family physician and take his blood pressure machine along so they can compare the readings. At this point the patient is counseled on dietary measures for  hypertension and advised to see his family physician.    Arby Barrette, MD 10/17/15 [phone]

## 2015-10-17 NOTE — ED Notes (Signed)
Woke w ha around 230  States bp has been high  159/101   Blurred vision,  dizzy

## 2015-10-17 NOTE — ED Notes (Signed)
Woke w ha, dizziness, blurred vision,  And bp up to 159/101,  Called his md and was told to come here

## 2016-03-20 ENCOUNTER — Emergency Department (HOSPITAL_BASED_OUTPATIENT_CLINIC_OR_DEPARTMENT_OTHER)
Admission: EM | Admit: 2016-03-20 | Discharge: 2016-03-20 | Disposition: A | Discharge status: Home / Self Care | Payer: Medicare Other | Attending: Emergency Medicine | Admitting: Emergency Medicine

## 2016-03-20 ENCOUNTER — Encounter (HOSPITAL_BASED_OUTPATIENT_CLINIC_OR_DEPARTMENT_OTHER): Payer: Self-pay

## 2016-03-20 DIAGNOSIS — I10 Essential (primary) hypertension: Secondary | ICD-10-CM | POA: Diagnosis not present

## 2016-03-20 DIAGNOSIS — Z87891 Personal history of nicotine dependence: Secondary | ICD-10-CM | POA: Diagnosis not present

## 2016-03-20 DIAGNOSIS — Z4801 Encounter for change or removal of surgical wound dressing: Secondary | ICD-10-CM | POA: Insufficient documentation

## 2016-03-20 DIAGNOSIS — F319 Bipolar disorder, unspecified: Secondary | ICD-10-CM | POA: Insufficient documentation

## 2016-03-20 DIAGNOSIS — J45909 Unspecified asthma, uncomplicated: Secondary | ICD-10-CM | POA: Insufficient documentation

## 2016-03-20 DIAGNOSIS — Z4889 Encounter for other specified surgical aftercare: Secondary | ICD-10-CM

## 2016-03-20 DIAGNOSIS — E039 Hypothyroidism, unspecified: Secondary | ICD-10-CM | POA: Diagnosis not present

## 2016-03-20 DIAGNOSIS — E119 Type 2 diabetes mellitus without complications: Secondary | ICD-10-CM | POA: Diagnosis not present

## 2016-03-20 NOTE — ED Notes (Signed)
Pt wants surgical incision in his umbilicus checked from where he had his gall bladder taken out last week.  He has not contacted surgeon, no pus drainage, no fevers, steri strips are still in place

## 2016-03-20 NOTE — ED Notes (Signed)
Pt verbalizes understanding of d/c instructions and denies any further needs at this time. 

## 2016-03-20 NOTE — ED Provider Notes (Addendum)
CSN: [id number]     Arrival date & time 03/20/16  0120 History   First MD Initiated Contact with Patient 03/20/16 [phone]     Chief Complaint  Patient presents with  . Wound Check     (Consider location/radiation/quality/duration/timing/severity/associated sxs/prior Treatment) HPI  This is a 55 year old male who underwent laparoscopic cholecystectomy on April 5. He has been back to see the surgeon on April 11 and was told that he was healing well. He is here concerned that there may be a problem with his umbilical incision. He is afraid it may be splitting open. There are Steri-Strips in place. He states his umbilicus area is tender to palpation. There has been no drainage or erythema. He has not had a fever.  Past Medical History  Diagnosis Date  . Epididymal pain     LEFT  . Hypertension   . Asthma   . 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 Migraine   . Type 2 diabetes mellitus (HCC)   . Hyperthyroidism     NO MEDS   . GERD (gastroesophageal reflux disease)   . Gastric ulcer   . Frequency of urination   . Feeling of incomplete bladder emptying   . Borderline glaucoma   . Bipolar 1 disorder [facility](HCC)    Past Surgical History  Procedure Laterality Date  . Anterior cervical decomp/discectomy fusion  2007    C4  --  C6  . Testicle removal Left   . Multiple cyst removed from chest  AGE 61  . Excision lipoma left shoulder  2004 (APPROX)  . Epididymectomy Left 11/09/2013    Procedure:  LEFT EPIDIDYMECTOMY;  Surgeon: Bjorn PippinJohn Wrenn, MD;  [facility] [place];  Service: Urology;  Laterality: Left;  POSSIBLE OUTPATIENT WITH OBSERVATION  . Cystoscopy N/A 11/09/2013    Procedure: CYSTOSCOPY FLEXIBLE;  Surgeon: Bjorn PippinJohn Wrenn, MD;  [facility] [place];  Service: Urology;  Laterality: N/A;  . Cervical fusion    . Cholecystectomy     No family history on file. Social  History  Substance Use Topics  . Smoking status: Former Smoker -- 0.25 packs/day for 15 years    Types: Cigarettes    Quit date: 11/08/1992  . Smokeless tobacco: Never Used  . Alcohol Use: No    Review of Systems  All other systems reviewed and are negative.   Allergies  Nsaids; Tramadol; Amoxicillin; Ampicillin; Dilantin; Penicillins; Risperidone and related; Strawberry extract; Bactrim; and Oatmeal  Home Medications   Prior to Admission medications   Medication Sig Start Date End Date Taking? Authorizing Provider  cyclobenzaprine (FLEXERIL) 10 MG tablet Take 1 tablet (10 mg total) by mouth 2 (two) times daily as needed for muscle spasms. 10/12/15   Courteney Lyn Mackuen, MD  dicyclomine (BENTYL) 20 MG tablet Take 1 tablet (20 mg total) by mouth 2 (two) times daily. 08/29/15   Nicole [name], PA-C  fluticasone-salmeterol (ADVAIR HFA) 119-14115-21 MCG/ACT inhaler Inhale 2 puffs into the lungs daily.    Historical Provider, MD  HYDROcodone-acetaminophen (NORCO/VICODIN) 5-325 MG per tablet Take 1-2 tablets by mouth every 6 hours as needed for pain and/or cough. 08/29/15   Nicole [name], PA-C  LATUDA 60 MG TABS Take 60 mg by mouth daily. 10/03/14   Historical Provider, MD  omeprazole (PRILOSEC) 20 MG capsule Take 1 capsule (20 mg total) by mouth 2 (two)  times daily. 11/08/14   [name], MD  ondansetron (ZOFRAN) 4 MG tablet Take 1 tablet (4 mg total) by mouth every 8 (eight) hours as needed for nausea or vomiting. 08/29/15   Joni Reining [name], PA-C  oxyCODONE-acetaminophen (PERCOCET/ROXICET) 5-325 MG tablet Take 1 tablet by mouth every 6 (six) hours as needed for severe pain. 10/12/15   Courteney Lyn Mackuen, MD  SITagliptin Phosphate (JANUVIA PO) Take by mouth.    Historical Provider, MD  sucralfate (CARAFATE) 1 GM/10ML suspension Take 10 mLs (1 g total) by mouth 4 (four) times daily -  with meals and at bedtime. 08/29/15   Nicole [name], PA-C  topiramate (TOPAMAX) 100 MG tablet Take 300 mg  by mouth every evening.    Historical Provider, MD   BP 113/83 mmHg  Pulse 78  Temp(Src) 97.8 F (36.6 C) (Oral)  Resp 18  Ht  (1.702 m)  Wt 205 lb (92.987 kg)  BMI 32.10 kg/m2  SpO2 97%   Physical Exam  General: Well-developed, well-nourished male in no acute distress; appearance consistent with age of record HENT: normocephalic; atraumatic Eyes: Normal appearance Neck: supple Heart: regular rate and rhythm Lungs: clear to auscultation bilaterally Abdomen: soft; nondistended; supraumbilical laparoscopy incision with Steri-Strips in place; mild tenderness around incision without erythema, warmth or drainage; bowel sounds present Extremities: No deformity; full range of motion; pulses normal Neurologic: Awake, alert and oriented; motor function intact in all extremities and symmetric; no facial droop Skin: Warm and dry Psychiatric: Normal mood and affect    ED Course  Procedures (including critical care time)   MDM  The patient's Steri-Strips were removed and the area cleaned with chlorhexidine. The wound appears to be healing well without dehiscence. New Steri-Strips were applied across the wound after prepping the surrounding skin with benzoin.    Paula Libra, MD 03/20/16 1610  Paula Libra, MD 03/20/16 [id number]

## 2016-03-30 ENCOUNTER — Encounter (HOSPITAL_BASED_OUTPATIENT_CLINIC_OR_DEPARTMENT_OTHER): Payer: Self-pay | Admitting: *Deleted

## 2016-03-30 ENCOUNTER — Emergency Department (HOSPITAL_BASED_OUTPATIENT_CLINIC_OR_DEPARTMENT_OTHER): Payer: Medicare Other

## 2016-03-30 ENCOUNTER — Emergency Department (HOSPITAL_BASED_OUTPATIENT_CLINIC_OR_DEPARTMENT_OTHER)
Admission: EM | Admit: 2016-03-30 | Discharge: 2016-03-30 | Disposition: A | Discharge status: Home / Self Care | Payer: Medicare Other | Attending: Emergency Medicine | Admitting: Emergency Medicine

## 2016-03-30 DIAGNOSIS — F319 Bipolar disorder, unspecified: Secondary | ICD-10-CM | POA: Diagnosis not present

## 2016-03-30 DIAGNOSIS — E0590 Thyrotoxicosis, unspecified without thyrotoxic crisis or storm: Secondary | ICD-10-CM | POA: Insufficient documentation

## 2016-03-30 DIAGNOSIS — J029 Acute pharyngitis, unspecified: Secondary | ICD-10-CM | POA: Diagnosis not present

## 2016-03-30 DIAGNOSIS — Z87891 Personal history of nicotine dependence: Secondary | ICD-10-CM | POA: Insufficient documentation

## 2016-03-30 DIAGNOSIS — E119 Type 2 diabetes mellitus without complications: Secondary | ICD-10-CM | POA: Insufficient documentation

## 2016-03-30 DIAGNOSIS — I10 Essential (primary) hypertension: Secondary | ICD-10-CM | POA: Diagnosis not present

## 2016-03-30 DIAGNOSIS — J45909 Unspecified asthma, uncomplicated: Secondary | ICD-10-CM | POA: Diagnosis not present

## 2016-03-30 LAB — RAPID STREP SCREEN (MED CTR MEBANE ONLY): Streptococcus, Group A Screen (Direct): NEGATIVE

## 2016-03-30 NOTE — Discharge Instructions (Signed)

## 2016-03-30 NOTE — ED Provider Notes (Signed)
CSN: [id number]     Arrival date & time 03/30/16  1653 History   First MD Initiated Contact with Patient 03/30/16 1704     Chief Complaint  Patient presents with  . Sore Throat     (Consider location/radiation/quality/duration/timing/severity/associated sxs/prior Treatment) HPI Comments: Patient reports onset of sore throat this morning.  Some increase in discomfort with swallowing. Productive cough with greenish-yellow sputum.  Patient is a 55 y.o. male presenting with pharyngitis. The history is provided by the patient. No language interpreter was used.  Sore Throat This is a new problem. The current episode started today. The problem has been gradually worsening. Associated symptoms include chills, coughing and a sore throat. The symptoms are aggravated by swallowing.    Past Medical History  Diagnosis Date  . Epididymal pain     LEFT  . Hypertension   . Asthma   . 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 Migraine   . Type 2 diabetes mellitus (HCC)   . Hyperthyroidism     NO MEDS   . GERD (gastroesophageal reflux disease)   . Gastric ulcer   . Frequency of urination   . Feeling of incomplete bladder emptying   . Borderline glaucoma   . Bipolar 1 disorder [facility])    Past Surgical History  Procedure Laterality Date  . Anterior cervical decomp/discectomy fusion  2007    C4  --  C6  . Testicle removal Left   . Multiple cyst removed from chest  AGE 25  . Excision lipoma left shoulder  2004 (APPROX)  . Epididymectomy Left 11/09/2013    Procedure:  LEFT EPIDIDYMECTOMY;  Surgeon: Bjorn Pippin, MD;  Location: [facility];  Service: Urology;  Laterality: Left;  POSSIBLE OUTPATIENT WITH OBSERVATION  . Cystoscopy N/A 11/09/2013    Procedure: CYSTOSCOPY FLEXIBLE;  Surgeon: Bjorn Pippin, MD;  Location: [facility];  Service: Urology;  Laterality: N/A;  . Cervical  fusion    . Cholecystectomy     No family history on file. Social History  Substance Use Topics  . Smoking status: Former Smoker -- 0.25 packs/day for 15 years    Types: Cigarettes    Quit date: 11/08/1992  . Smokeless tobacco: Never Used  . Alcohol Use: No    Review of Systems  Constitutional: Positive for chills.  HENT: Positive for sore throat, trouble swallowing and voice change.   Respiratory: Positive for cough.   All other systems reviewed and are negative.     Allergies  Nsaids; Tramadol; Amoxicillin; Ampicillin; Dilantin; Penicillins; Risperidone and related; Strawberry extract; Bactrim; and Oatmeal  Home Medications   Prior to Admission medications   Medication Sig Start Date End Date Taking? Authorizing Provider  cyclobenzaprine (FLEXERIL) 10 MG tablet Take 1 tablet (10 mg total) by mouth 2 (two) times daily as needed for muscle spasms. 10/12/15   Courteney Lyn Mackuen, MD  dicyclomine (BENTYL) 20 MG tablet Take 1 tablet (20 mg total) by mouth 2 (two) times daily. 08/29/15   Nicole [name], PA-C  fluticasone-salmeterol (ADVAIR HFA) 782-95 MCG/ACT inhaler Inhale 2 puffs into the lungs daily.    Historical Provider, MD  HYDROcodone-acetaminophen (NORCO/VICODIN) 5-325 MG per tablet Take 1-2 tablets by mouth every 6 hours as needed for pain and/or cough. 08/29/15   Nicole [name], PA-C  LATUDA 60 MG TABS Take 60 mg by mouth  daily. 10/03/14   Historical Provider, MD  omeprazole (PRILOSEC) 20 MG capsule Take 1 capsule (20 mg total) by mouth 2 (two) times daily. 11/08/14   Geoffery Lyonsouglas Delo, MD  ondansetron (ZOFRAN) 4 MG tablet Take 1 tablet (4 mg total) by mouth every 8 (eight) hours as needed for nausea or vomiting. 08/29/15   [name] [name], PA-C  oxyCODONE-acetaminophen (PERCOCET/ROXICET) 5-325 MG tablet Take 1 tablet by mouth every 6 (six) hours as needed for severe pain. 10/12/15   Courteney Lyn Mackuen, MD  SITagliptin Phosphate (JANUVIA PO) Take by mouth.    Historical  Provider, MD  sucralfate (CARAFATE) 1 GM/10ML suspension Take 10 mLs (1 g total) by mouth 4 (four) times daily -  with meals and at bedtime. 08/29/15   Nicole [name], PA-C  topiramate (TOPAMAX) 100 MG tablet Take 300 mg by mouth every evening.    Historical Provider, MD   BP 109/84 mmHg  Pulse 74  Temp(Src) 98.7 F (37.1 C) (Oral)  Resp 18  Ht 5\' 7"  (1.702 m)  Wt 92.987 kg  BMI 32.10 kg/m2  SpO2 98% Physical Exam  Constitutional: He is oriented to person, place, and time. He appears well-developed and well-nourished.  HENT:  Head: Normocephalic.  Mouth/Throat: Posterior oropharyngeal erythema present. No oropharyngeal exudate.  Eyes: Conjunctivae are normal.  Neck: Neck supple.  Cardiovascular: Normal rate and regular rhythm.   Pulmonary/Chest: Effort normal.  Abdominal: Soft. There is tenderness.  Mild tenderness persists at site of umbilical incision.  Musculoskeletal: He exhibits no edema or tenderness.  Lymphadenopathy:    He has no cervical adenopathy.  Neurological: He is alert and oriented to person, place, and time.  Skin: Skin is warm and dry.  Psychiatric: He has a normal mood and affect.  Nursing note and vitals reviewed.   ED Course  Procedures (including critical care time) Labs Review Labs Reviewed  RAPID STREP SCREEN (NOT AT [facility])  CULTURE, GROUP A STREP [facility])    Imaging Review Dg Chest 2 View  03/30/2016  CLINICAL DATA:  Sore throat, chills and cough today. History of hypertension, diabetes and asthma EXAM: CHEST  2 VIEW COMPARISON:  11/24/2015. FINDINGS: The heart size and mediastinal contours are normal. The lungs are clear. There is no pleural effusion or pneumothorax. No acute osseous findings are identified. Postsurgical changes are present within the lower cervical spine. IMPRESSION: No active cardiopulmonary process. Electronically Signed   By: Carey BullocksWilliam  Veazey M.D.   On: 03/30/2016 17:46   I have personally reviewed and evaluated these images and  lab results as part of my medical decision-making.   EKG Interpretation None     Lab and radiology results reviewed and shared with patient. MDM   Final diagnoses:  None   Pt with negative strep. Diagnosis of viral pharyngitis. No abx indicated at this time. Discussed that results of strep culture are pending and patient will be informed if positive result and abx will be called in at that time. Discharge with symptomatic tx. No evidence of dehydration. Pt is tolerating secretions. Presentation not concerning for peritonsillar abscess or spread of infection to deep spaces of the throat; patent airway. Specific return precautions discussed. Recommended PCP follow up. Pt appears safe for discharge.      Felicie Mornavid Juliano Mceachin, NP 03/30/16 Flossie [id number]  Loren Raceravid Yelverton, MD 04/06/16 [phone]

## 2016-03-30 NOTE — ED Notes (Signed)
Woke with sore throat, chills, and cough.

## 2016-04-02 LAB — CULTURE, GROUP A STREP (THRC)

## 2016-09-13 ENCOUNTER — Emergency Department (HOSPITAL_BASED_OUTPATIENT_CLINIC_OR_DEPARTMENT_OTHER)
Admission: EM | Admit: 2016-09-13 | Discharge: 2016-09-13 | Disposition: A | Discharge status: Home / Self Care | Payer: Medicare Other | Attending: Emergency Medicine | Admitting: Emergency Medicine

## 2016-09-13 ENCOUNTER — Encounter (HOSPITAL_BASED_OUTPATIENT_CLINIC_OR_DEPARTMENT_OTHER): Payer: Self-pay | Admitting: *Deleted

## 2016-09-13 DIAGNOSIS — Z87891 Personal history of nicotine dependence: Secondary | ICD-10-CM | POA: Insufficient documentation

## 2016-09-13 DIAGNOSIS — E119 Type 2 diabetes mellitus without complications: Secondary | ICD-10-CM | POA: Diagnosis not present

## 2016-09-13 DIAGNOSIS — R569 Unspecified convulsions: Secondary | ICD-10-CM | POA: Diagnosis present

## 2016-09-13 DIAGNOSIS — I10 Essential (primary) hypertension: Secondary | ICD-10-CM | POA: Insufficient documentation

## 2016-09-13 DIAGNOSIS — J45909 Unspecified asthma, uncomplicated: Secondary | ICD-10-CM | POA: Insufficient documentation

## 2016-09-13 DIAGNOSIS — E039 Hypothyroidism, unspecified: Secondary | ICD-10-CM | POA: Insufficient documentation

## 2016-09-13 DIAGNOSIS — G40909 Epilepsy, unspecified, not intractable, without status epilepticus: Secondary | ICD-10-CM | POA: Diagnosis not present

## 2016-09-13 DIAGNOSIS — R4182 Altered mental status, unspecified: Secondary | ICD-10-CM | POA: Insufficient documentation

## 2016-09-13 DIAGNOSIS — Z79899 Other long term (current) drug therapy: Secondary | ICD-10-CM | POA: Diagnosis not present

## 2016-09-13 LAB — URINE MICROSCOPIC-ADD ON

## 2016-09-13 LAB — CBC WITH DIFFERENTIAL/PLATELET
Basophils Absolute: 0 10*3/uL (ref 0.0–0.1)
Basophils Relative: 0 %
Eosinophils Absolute: 0 10*3/uL (ref 0.0–0.7)
Eosinophils Relative: 1 %
HCT: 36.9 % — ABNORMAL LOW (ref 39.0–52.0)
Hemoglobin: 12.2 g/dL — ABNORMAL LOW (ref 13.0–17.0)
Lymphocytes Relative: 46 %
Lymphs Abs: 1.9 10*3/uL (ref 0.7–4.0)
MCH: 27.8 pg (ref 26.0–34.0)
MCHC: 33.1 g/dL (ref 30.0–36.0)
MCV: 84.1 fL (ref 78.0–100.0)
Monocytes Absolute: 0.5 10*3/uL (ref 0.1–1.0)
Monocytes Relative: 11 %
Neutro Abs: 1.7 10*3/uL (ref 1.7–7.7)
Neutrophils Relative %: 42 %
Platelets: 124 10*3/uL — ABNORMAL LOW (ref 150–400)
RBC: 4.39 MIL/uL (ref 4.22–5.81)
RDW: 14.9 % (ref 11.5–15.5)
WBC: 4 10*3/uL (ref 4.0–10.5)

## 2016-09-13 LAB — COMPREHENSIVE METABOLIC PANEL
ALT: 50 U/L (ref 17–63)
AST: 34 U/L (ref 15–41)
Albumin: 4 g/dL (ref 3.5–5.0)
Alkaline Phosphatase: 54 U/L (ref 38–126)
Anion gap: 5 (ref 5–15)
BUN: 18 mg/dL (ref 6–20)
CO2: 26 mmol/L (ref 22–32)
Calcium: 9.2 mg/dL (ref 8.9–10.3)
Chloride: 107 mmol/L (ref 101–111)
Creatinine, Ser: 1.28 mg/dL — ABNORMAL HIGH (ref 0.61–1.24)
GFR calc Af Amer: >60 mL/min (ref >60)
GFR calc non Af Amer: >60 mL/min (ref >60)
Glucose, Bld: 218 mg/dL — ABNORMAL HIGH (ref 65–99)
Potassium: 3.9 mmol/L (ref 3.5–5.1)
Sodium: 138 mmol/L (ref 135–145)
Total Bilirubin: 0.6 mg/dL (ref 0.3–1.2)
Total Protein: 6.8 g/dL (ref 6.5–8.1)

## 2016-09-13 LAB — ETHANOL: Alcohol, Ethyl (B): <5 mg/dL (ref <5)

## 2016-09-13 LAB — URINALYSIS, ROUTINE W REFLEX MICROSCOPIC
Bilirubin Urine: NEGATIVE
Glucose, UA: >1000 mg/dL — AB
Hgb urine dipstick: NEGATIVE
Ketones, ur: NEGATIVE mg/dL
Leukocytes, UA: NEGATIVE
Nitrite: NEGATIVE
Protein, ur: NEGATIVE mg/dL
Specific Gravity, Urine: 1.024 (ref 1.005–1.030)
pH: 6.5 (ref 5.0–8.0)

## 2016-09-13 MED ORDER — LORAZEPAM 2 MG/ML IJ SOLN
INTRAMUSCULAR | Status: AC
  Filled 2016-09-13: qty 1

## 2016-09-13 MED ORDER — FENTANYL CITRATE (PF) 100 MCG/2ML IJ SOLN
50.0000 ug | Freq: Once | INTRAMUSCULAR | Status: AC
  Administered 2016-09-13: 50 ug via INTRAVENOUS
  Filled 2016-09-13: qty 2

## 2016-09-13 MED ORDER — SODIUM CHLORIDE 0.9 % IV BOLUS (SEPSIS)
1000.0000 mL | Freq: Once | INTRAVENOUS | Status: AC
Start: 2016-09-13 — End: 2016-09-13
  Administered 2016-09-13: 1000 mL via INTRAVENOUS

## 2016-09-13 MED ORDER — LEVETIRACETAM 750 MG PO TABS: 750.0000 mg | ORAL_TABLET | Freq: Two times a day (BID) | ORAL | 0 refills | Status: DC

## 2016-09-13 MED ORDER — LORAZEPAM 2 MG/ML IJ SOLN
1.0000 mg | Freq: Once | INTRAMUSCULAR | Status: AC
  Administered 2016-09-13: 1 mg via INTRAVENOUS

## 2016-09-13 MED ORDER — LEVETIRACETAM 500 MG/5ML IV SOLN
500.0000 mg | Freq: Once | INTRAVENOUS | Status: AC
Start: 2016-09-13 — End: 2016-09-13
  Administered 2016-09-13: 500 mg via INTRAVENOUS
  Filled 2016-09-13: qty 5

## 2016-09-13 MED ORDER — LEVETIRACETAM 500 MG/5ML IV SOLN
INTRAVENOUS | Status: AC
  Filled 2016-09-13: qty 5

## 2016-09-13 NOTE — ED Triage Notes (Signed)
Pt with hx of DM and seizures was having HA at church and was driven here by family member who reports that pt had a seizure in route.  No visible injuries.  Pt is slow to respond at this time but is following commands.

## 2016-09-13 NOTE — Discharge Instructions (Signed)
Follow-up with your neurologist as soon as possible.  I spoke with Dr. Hyacinth MeekerMiller who is on-call for your neurologist and he advised to increase her Keppra to 750 mg twice a day.  Return here as needed.

## 2016-09-13 NOTE — ED Notes (Signed)
CBG 244 

## 2016-09-13 NOTE — ED Notes (Signed)
CBG on arrival was 244 (over ride was necessary as pt did not have armband yet)

## 2016-09-13 NOTE — ED Notes (Signed)
Black wallet given to Mr. Nicholas [name].

## 2016-09-13 NOTE — ED Notes (Signed)
Pt ate TV dinner, popcorn and drank ginger ale.  Pt states that his pain is decreased from 8/10-2/10.  Pt is alert and oriented

## 2016-09-13 NOTE — ED Provider Notes (Signed)
[facility]   CSN: [id number] Arrival date & time: 09/13/16  0946     History   Chief Complaint Chief Complaint  Patient presents with  . Seizures    HPI Nicholas [name] is a 55 y.o. male.  HPI Patient presents to the emergency department with with a seizure that occurred prior to arrival.  The patient was in church when he noticed that he was developing headache and these had seizures that occurred similar to this in the past.  Patient was being driven here by his sister when he started having a seizure.  The patient is on Keppra for his seizures.  Patient is unable to give any history due to the fact that he is post ictal. Past Medical History:  Diagnosis Date  . Asthma   . Bipolar 1 disorder (HCC)   . Borderline glaucoma   . Epididymal pain    LEFT  . 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 Feeling of incomplete bladder emptying   . Frequency of urination   . Gastric ulcer   . GERD (gastroesophageal reflux disease)   . Hypertension   . Hyperthyroidism    NO MEDS   . Migraine   . Type 2 diabetes mellitus [facility])     Patient Active Problem List   Diagnosis Date Noted  . Testicular/scrotal pain 11/09/2013  . Microhematuria 11/09/2013  . Condyloma acuminatum of scrotum 11/09/2013    Past Surgical History:  Procedure Laterality Date  . ANTERIOR CERVICAL DECOMP/DISCECTOMY FUSION  2007   C4  --  C6  . CERVICAL FUSION    . CHOLECYSTECTOMY    . CYSTOSCOPY N/A 11/09/2013   Procedure: CYSTOSCOPY FLEXIBLE;  Surgeon: Bjorn Pippin, MD;  Location: [facility];  Service: Urology;  Laterality: N/A;  . EPIDIDYMECTOMY Left 11/09/2013   Procedure:  LEFT EPIDIDYMECTOMY;  Surgeon: Bjorn Pippin, MD;  Location: [facility];  Service: Urology;  Laterality: Left;  POSSIBLE OUTPATIENT WITH OBSERVATION  . EXCISION LIPOMA LEFT SHOULDER  2004  (APPROX)  . MULTIPLE CYST REMOVED FROM CHEST  AGE 8  . TESTICLE REMOVAL Left        Home Medications    Prior to Admission medications   Medication Sig Start Date End Date Taking? Authorizing Provider  cyclobenzaprine (FLEXERIL) 10 MG tablet Take 1 tablet (10 mg total) by mouth 2 (two) times daily as needed for muscle spasms. 10/12/15   Courteney Lyn Mackuen, MD  dicyclomine (BENTYL) 20 MG tablet Take 1 tablet (20 mg total) by mouth 2 (two) times daily. 08/29/15   Nicole [name], PA-C  fluticasone-salmeterol (ADVAIR HFA) 528-41 MCG/ACT inhaler Inhale 2 puffs into the lungs daily.    Historical Provider, MD  HYDROcodone-acetaminophen (NORCO/VICODIN) 5-325 MG per tablet Take 1-2 tablets by mouth every 6 hours as needed for pain and/or cough. 08/29/15   Nicole [name], PA-C  LATUDA 60 MG TABS Take 60 mg by mouth daily. 10/03/14   Historical Provider, MD  omeprazole (PRILOSEC) 20 MG capsule Take 1 capsule (20 mg total) by mouth 2 (two) times daily. 11/08/14   [name], MD  ondansetron (ZOFRAN) 4 MG tablet Take 1 tablet (4 mg total) by mouth every 8 (eight) hours as needed for nausea or vomiting. 08/29/15   Joni Reining [name], PA-C  oxyCODONE-acetaminophen (PERCOCET/ROXICET) 5-325 MG tablet Take 1 tablet by mouth  every 6 (six) hours as needed for severe pain. 10/12/15   Courteney Lyn Mackuen, MD  SITagliptin Phosphate (JANUVIA PO) Take by mouth.    Historical Provider, MD  sucralfate (CARAFATE) 1 GM/10ML suspension Take 10 mLs (1 g total) by mouth 4 (four) times daily -  with meals and at bedtime. 08/29/15   Nicole [name], PA-C  topiramate (TOPAMAX) 100 MG tablet Take 300 mg by mouth every evening.    Historical Provider, MD    Family History No family history on file.  Social History Social History  Substance Use Topics  . Smoking status: Former Smoker    Packs/day: 0.25    Years: 15.00    Types: Cigarettes    Quit date: 11/08/1992  . Smokeless tobacco: Never Used  . Alcohol use No      Allergies   Nsaids; Tramadol; Amoxicillin; Ampicillin; Dilantin [phenytoin]; Penicillins; Risperidone and related; Strawberry extract; Bactrim [sulfamethoxazole-trimethoprim]; and Oatmeal   Review of Systems Review of Systems Level V caveat applies due to altered mental status  Physical Exam Updated Vital Signs BP 122/86   Pulse 81   Temp 98.6 F (37 C) (Oral)   Resp 16   SpO2 99%   Physical Exam  Constitutional: He appears well-developed and well-nourished. No distress.  HENT:  Head: Normocephalic and atraumatic.  Mouth/Throat: Oropharynx is clear and moist.  Eyes: Pupils are equal, round, and reactive to light.  Neck: Normal range of motion. Neck supple.  Cardiovascular: Normal rate, regular rhythm and normal heart sounds.  Exam reveals no gallop and no friction rub.   No murmur heard. Pulmonary/Chest: Effort normal and breath sounds normal. No respiratory distress. He has no wheezes.  Abdominal: Soft. Bowel sounds are normal. He exhibits no distension. There is no tenderness.  Neurological: He is alert. He exhibits normal muscle tone. Coordination normal.  Patient is post ictal  Skin: Skin is warm and dry. No rash noted. No erythema.  Psychiatric: He has a normal mood and affect. His behavior is normal.  Nursing note and vitals reviewed.    ED Treatments / Results  Labs (all labs ordered are listed, but only abnormal results are displayed) Labs Reviewed  COMPREHENSIVE METABOLIC PANEL - Abnormal; Notable for the following:       Result Value   Glucose, Bld 218 (*)    Creatinine, Ser 1.28 (*)    All other components within normal limits  CBC WITH DIFFERENTIAL/PLATELET - Abnormal; Notable for the following:    Hemoglobin 12.2 (*)    HCT 36.9 (*)    Platelets 124 (*)    All other components within normal limits  URINALYSIS, ROUTINE W REFLEX MICROSCOPIC (NOT AT [facility]) - Abnormal; Notable for the following:    Glucose, UA >1000 (*)    All other components  within normal limits  URINE MICROSCOPIC-ADD ON - Abnormal; Notable for the following:    Squamous Epithelial / LPF 0-5 (*)    Bacteria, UA RARE (*)    All other components within normal limits  ETHANOL    EKG  EKG Interpretation  Date/Time:  Sunday September 13 2016 09:53:52 EDT Ventricular Rate:  91 PR Interval:    QRS Duration: 85 QT Interval:  363 QTC Calculation: 447 R Axis:   -17 Text Interpretation:  Sinus rhythm Borderline left axis deviation Borderline T abnormalities, anterior leads No significant change since last tracing Confirmed by KNAPP  MD-J, [name] ([id number]) on 09/13/2016 9:56:45 AM       Radiology No results  found.  Procedures Procedures (including critical care time)  Medications Ordered in ED Medications  levETIRAcetam (KEPPRA) 500 mg in sodium chloride 0.9 % 100 mL IVPB (500 mg Intravenous New Bag/Given 09/13/16 1439)  levETIRAcetam (KEPPRA) 500 MG/5ML injection (500 mg  Not Given 09/13/16 1438)  LORazepam (ATIVAN) injection 1 mg (1 mg Intravenous Given by Other 09/13/16 1029)  sodium chloride 0.9 % bolus 1,000 mL (0 mLs Intravenous Stopped 09/13/16 1135)     Initial Impression / Assessment and Plan / ED Course  I have reviewed the triage vital signs and the nursing notes.  Pertinent labs & imaging results that were available during my care of the patient were reviewed by me and considered in my medical decision making (see chart for details).  Clinical Course    Patient had a seizure right after arrival here in the emergency department, was given Ativan is also given Keppra IV.  Patient has been monitored over several hours here in the emergency department has been stable.  Patient is given medications for his headache.  Patient will be advised to follow-up with his neurologist is Dr. Hyacinth Meeker from his neurologist office who advised to increase his Keppra to 750 mg twice a day.  Advised the patient of the plan and all questions were answered  Final Clinical  Impressions(s) / ED Diagnoses   Final diagnoses:  None    New Prescriptions New Prescriptions   No medications on file     Charlestine Night, PA-C 09/13/16 1644    Linwood Dibbles, MD 09/14/16 [phone]

## 2016-09-13 NOTE — ED Triage Notes (Signed)
Pt assisted to wheelchair from vehicle upon arrival. Per visitor accompanying pt: pt reported headache while at church and began having a seizure en route to this facility. Pt is diabetic. Pt currently post-ictal (eyes open, slow to respond, breathing regular, even and unlabored).

## 2016-09-13 NOTE — ED Notes (Signed)
Gave pt frozen dinner, chicken parm.  Pt has ginger ale.  EDP advised to let pt eat before discharge

## 2016-09-14 LAB — CBG MONITORING, ED: Glucose-Capillary: 244 mg/dL — ABNORMAL HIGH (ref 65–99)

## 2016-10-07 ENCOUNTER — Emergency Department (HOSPITAL_BASED_OUTPATIENT_CLINIC_OR_DEPARTMENT_OTHER)
Admission: EM | Admit: 2016-10-07 | Discharge: 2016-10-08 | Disposition: A | Discharge status: Home / Self Care | Payer: Medicare Other | Attending: Emergency Medicine | Admitting: Emergency Medicine

## 2016-10-07 ENCOUNTER — Encounter (HOSPITAL_BASED_OUTPATIENT_CLINIC_OR_DEPARTMENT_OTHER): Payer: Self-pay | Admitting: *Deleted

## 2016-10-07 DIAGNOSIS — K120 Recurrent oral aphthae: Secondary | ICD-10-CM | POA: Diagnosis not present

## 2016-10-07 DIAGNOSIS — R590 Localized enlarged lymph nodes: Secondary | ICD-10-CM | POA: Insufficient documentation

## 2016-10-07 DIAGNOSIS — R591 Generalized enlarged lymph nodes: Secondary | ICD-10-CM

## 2016-10-07 DIAGNOSIS — E119 Type 2 diabetes mellitus without complications: Secondary | ICD-10-CM | POA: Insufficient documentation

## 2016-10-07 DIAGNOSIS — I10 Essential (primary) hypertension: Secondary | ICD-10-CM | POA: Diagnosis not present

## 2016-10-07 DIAGNOSIS — J029 Acute pharyngitis, unspecified: Secondary | ICD-10-CM | POA: Insufficient documentation

## 2016-10-07 DIAGNOSIS — J45909 Unspecified asthma, uncomplicated: Secondary | ICD-10-CM | POA: Insufficient documentation

## 2016-10-07 DIAGNOSIS — Z87891 Personal history of nicotine dependence: Secondary | ICD-10-CM | POA: Insufficient documentation

## 2016-10-07 HISTORY — DX: Unspecified convulsions: R56.9

## 2016-10-07 MED ORDER — IBUPROFEN 800 MG PO TABS: 800.0000 mg | ORAL_TABLET | Freq: Three times a day (TID) | ORAL | 0 refills | Status: DC | PRN

## 2016-10-07 MED ORDER — IBUPROFEN 800 MG PO TABS
800.0000 mg | ORAL_TABLET | Freq: Once | ORAL | Status: AC
  Administered 2016-10-08: 800 mg via ORAL
  Filled 2016-10-07: qty 1

## 2016-10-07 MED ORDER — MAGIC MOUTHWASH W/LIDOCAINE
5.0000 mL | Freq: Three times a day (TID) | ORAL | 0 refills | Status: DC | PRN
Start: 2016-10-07 — End: 2016-11-25

## 2016-10-07 MED ORDER — DEXAMETHASONE 6 MG PO TABS
10.0000 mg | ORAL_TABLET | Freq: Once | ORAL | Status: AC
  Administered 2016-10-08: 10 mg via ORAL
  Filled 2016-10-07: qty 1

## 2016-10-07 NOTE — ED Provider Notes (Signed)
[facility] Provider Note   CSN: [id number] Arrival date & time: 10/07/16  2338  By signing my name below, I, Nicholas [name], attest that this documentation has been prepared under the direction and in the presence of Nicholas MemosJason Sarkis Rhines, MD . Electronically Signed: Christy SartoriusAnastasia [name], Scribe. 10/07/2016. 11:56 PM.  History   Chief Complaint Chief Complaint  Patient presents with  . Sore Throat    The history is provided by the patient and medical records. No language interpreter was used.     HPI Comments:  Nicholas [name] is a 55 y.o. male with Hx of seizures who presents to the Emergency Department complaining of pain in the right side of his throat onset yesterday morning.  Pt had 3 seizures back to back the night before last.  Pt thinks he may have bitten down on the right side of his tongue.  He also notes cough for about 2 days.  Pt has had a sore throat like this in the past and was diagnosed with strep.  No alleviating factors noted.  No additional injury or complaint.  Pt is allergic to penicillins.    Past Medical History:  Diagnosis Date  . Asthma   . Bipolar 1 disorder (HCC)   . Borderline glaucoma   . Epididymal pain    LEFT  . 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 Feeling of incomplete bladder emptying   . Frequency of urination   . Gastric ulcer   . GERD (gastroesophageal reflux disease)   . Hypertension   . Hyperthyroidism    NO MEDS   . Migraine   . Seizures (HCC)   . Type 2 diabetes mellitus [facility](HCC)     Patient Active Problem List   Diagnosis Date Noted  . Testicular/scrotal pain 11/09/2013  . Microhematuria 11/09/2013  . Condyloma acuminatum of scrotum 11/09/2013    Past Surgical History:  Procedure Laterality Date  . ANTERIOR CERVICAL DECOMP/DISCECTOMY FUSION  2007   C4  --  C6  . CERVICAL FUSION    . CHOLECYSTECTOMY    . CYSTOSCOPY N/A  11/09/2013   Procedure: CYSTOSCOPY FLEXIBLE;  Surgeon: Bjorn PippinJohn Wrenn, MD;  [facility];  Service: Urology;  Laterality: N/A;  . EPIDIDYMECTOMY Left 11/09/2013   Procedure:  LEFT EPIDIDYMECTOMY;  Surgeon: Bjorn PippinJohn Wrenn, MD;  [facility];  Service: Urology;  Laterality: Left;  POSSIBLE OUTPATIENT WITH OBSERVATION  . EXCISION LIPOMA LEFT SHOULDER  2004 (APPROX)  . MULTIPLE CYST REMOVED FROM CHEST  AGE 25  . TESTICLE REMOVAL Left        Home Medications    Prior to Admission medications   Medication Sig Start Date End Date Taking? Authorizing Provider  amitriptyline (ELAVIL) 25 MG tablet Take 25 mg by mouth at bedtime.    Historical Provider, MD  atorvastatin (LIPITOR) 80 MG tablet Take 80 mg by mouth daily.    Historical Provider, MD  cyclobenzaprine (FLEXERIL) 10 MG tablet Take 1 tablet (10 mg total) by mouth 2 (two) times daily as needed for muscle spasms. 10/12/15   Courteney Lyn Mackuen, MD  dicyclomine (BENTYL) 20 MG tablet Take 1 tablet (20 mg total) by mouth 2 (two) times daily. 08/29/15   Nicole [name], PA-C  fluticasone-salmeterol (ADVAIR HFA) 295-28115-21 MCG/ACT inhaler Inhale 2 puffs into the lungs daily.    Historical Provider, MD  HYDROcodone-acetaminophen (NORCO/VICODIN) 5-325 MG per tablet Take 1-2 tablets by mouth every 6 hours as needed for pain and/or cough. 08/29/15   Nicole [name], PA-C  ibuprofen (ADVIL,MOTRIN) 800 MG tablet Take 1 tablet (800 mg total) by mouth every 8 (eight) hours as needed. 10/07/16   Nicholas MemosJason Alen Matheson, MD  LATUDA 60 MG TABS Take 60 mg by mouth daily. 10/03/14   Historical Provider, MD  levETIRAcetam (KEPPRA) 750 MG tablet Take 1 tablet (750 mg total) by mouth 2 (two) times daily. 09/13/16   Christopher Lawyer, PA-C  losartan (COZAAR) 50 MG tablet Take 50 mg by mouth daily.    Historical Provider, MD  magic mouthwash w/lidocaine SOLN Take 5 mLs by mouth 3 (three) times daily as needed for mouth pain. 10/07/16   Nicholas MemosJason  Atina Feeley, MD  omeprazole (PRILOSEC) 20 MG capsule Take 1 capsule (20 mg total) by mouth 2 (two) times daily. 11/08/14   Nicholas Lyonsouglas Delo, MD  ondansetron (ZOFRAN) 4 MG tablet Take 1 tablet (4 mg total) by mouth every 8 (eight) hours as needed for nausea or vomiting. 08/29/15   [name] [name], PA-C  oxyCODONE-acetaminophen (PERCOCET/ROXICET) 5-325 MG tablet Take 1 tablet by mouth every 6 (six) hours as needed for severe pain. 10/12/15   Courteney Lyn Mackuen, MD  rizatriptan (MAXALT) 10 MG tablet Take 10 mg by mouth as needed for migraine. May repeat in 2 hours if needed    Historical Provider, MD  SITagliptin Phosphate (JANUVIA PO) Take by mouth.    Historical Provider, MD  sucralfate (CARAFATE) 1 GM/10ML suspension Take 10 mLs (1 g total) by mouth 4 (four) times daily -  with meals and at bedtime. 08/29/15   Nicole [name], PA-C  topiramate (TOPAMAX) 100 MG tablet Take 300 mg by mouth every evening.    Historical Provider, MD    Family History History reviewed. No pertinent family history.  Social History Social History  Substance Use Topics  . Smoking status: Former Smoker    Packs/day: 0.25    Years: 15.00    Types: Cigarettes    Quit date: 11/08/1992  . Smokeless tobacco: Never Used  . Alcohol use No     Allergies   Nsaids; Tramadol; Amoxicillin; Ampicillin; Dilantin [phenytoin]; Penicillins; Risperidone and related; Strawberry extract; Bactrim [sulfamethoxazole-trimethoprim]; and Oatmeal   Review of Systems Review of Systems  Constitutional: Negative for fever.  HENT: Positive for sore throat.   All other systems reviewed and are negative.    Physical Exam Updated Vital Signs BP 135/88   Pulse 94   Temp 97.9 F (36.6 C)   Resp 18   Ht 5\' 7"  (1.702 m)   Wt 210 lb (95.3 kg)   SpO2 98%   BMI 32.89 kg/m   Physical Exam  Constitutional: He is oriented to person, place, and time.  HENT:  Head: Normocephalic and atraumatic.  Mouth/Throat: Uvula is midline. No tonsillar  abscesses.  One ulcer on the post tongue 3-4 mm. Tonsils symmetrical.  Redness in the back of the throat uv mid redness in back.  No peritonsillar abscess.  Redness in the bilateral nares with some mucous.    Eyes: Conjunctivae and EOM are normal. Pupils are equal, round, and reactive to light. Right eye exhibits no discharge. Left eye exhibits no discharge.  Neck: Normal range of motion. No tracheal deviation present. No thyromegaly present.  Cardiovascular: Normal rate, regular rhythm and normal heart sounds.  Exam reveals no gallop and no friction rub.   No murmur heard. Pulmonary/Chest: Effort  normal and breath sounds normal. No respiratory distress. He has no wheezes. He has no rales.  Lymphadenopathy:    He has cervical adenopathy ( Swollen lymph on the right anterior cervical chain that is tender.  ).  Neurological: He is alert and oriented to person, place, and time.     ED Treatments / Results   DIAGNOSTIC STUDIES:  Oxygen Saturation is 98% on RA, NML by my interpretation.    COORDINATION OF CARE:  11:55 PM Discussed treatment plan with pt at bedside and pt agreed to plan.  Labs (all labs ordered are listed, but only abnormal results are displayed) Labs Reviewed - No data to display  EKG  EKG Interpretation None       Radiology No results found.  Procedures Procedures (including critical care time)  Medications Ordered in ED Medications  dexamethasone (DECADRON) tablet 10 mg (10 mg Oral Given 10/08/16 0004)  ibuprofen (ADVIL,MOTRIN) tablet 800 mg (800 mg Oral Given 10/08/16 0004)     Initial Impression / Assessment and Plan / ED Course  I have reviewed the triage vital signs and the nursing notes.  Pertinent labs & imaging results that were available during my care of the patient were reviewed by me and considered in my medical decision making (see chart for details).  Clinical Course    2/5 centor criteria, likely viral pharyngitis. apthous ulcer on  tongue as well. Decadron given here. Will judicially use ibuprofen (h/o gastric ulcer). Magic mouthwash rx given.   Final Clinical Impressions(s) / ED Diagnoses   Final diagnoses:  Pharyngitis, unspecified etiology  Lymphadenopathy  Ulcer aphthous oral    New Prescriptions New Prescriptions   IBUPROFEN (ADVIL,MOTRIN) 800 MG TABLET    Take 1 tablet (800 mg total) by mouth every 8 (eight) hours as needed.   MAGIC MOUTHWASH W/LIDOCAINE SOLN    Take 5 mLs by mouth 3 (three) times daily as needed for mouth pain.   I personally performed the services described in this documentation, which was scribed in my presence. The recorded information has been reviewed and is accurate.   Nicholas Memos, MD 10/08/16 [phone]

## 2016-10-07 NOTE — ED Triage Notes (Signed)
Pt c/o sore throat x2 days 

## 2016-10-08 DIAGNOSIS — J029 Acute pharyngitis, unspecified: Secondary | ICD-10-CM | POA: Diagnosis not present

## 2016-11-07 ENCOUNTER — Encounter (HOSPITAL_BASED_OUTPATIENT_CLINIC_OR_DEPARTMENT_OTHER): Payer: Self-pay | Admitting: *Deleted

## 2016-11-07 ENCOUNTER — Emergency Department (HOSPITAL_BASED_OUTPATIENT_CLINIC_OR_DEPARTMENT_OTHER)
Admission: EM | Admit: 2016-11-07 | Discharge: 2016-11-08 | Disposition: A | Discharge status: Other Acute Inpatient Hospital | Payer: Medicare Other | Attending: Emergency Medicine | Admitting: Emergency Medicine

## 2016-11-07 ENCOUNTER — Emergency Department (HOSPITAL_BASED_OUTPATIENT_CLINIC_OR_DEPARTMENT_OTHER): Payer: Medicare Other

## 2016-11-07 DIAGNOSIS — Z87891 Personal history of nicotine dependence: Secondary | ICD-10-CM | POA: Insufficient documentation

## 2016-11-07 DIAGNOSIS — J029 Acute pharyngitis, unspecified: Secondary | ICD-10-CM | POA: Insufficient documentation

## 2016-11-07 DIAGNOSIS — R404 Transient alteration of awareness: Secondary | ICD-10-CM

## 2016-11-07 DIAGNOSIS — J45909 Unspecified asthma, uncomplicated: Secondary | ICD-10-CM | POA: Diagnosis not present

## 2016-11-07 DIAGNOSIS — Z79899 Other long term (current) drug therapy: Secondary | ICD-10-CM | POA: Insufficient documentation

## 2016-11-07 DIAGNOSIS — I10 Essential (primary) hypertension: Secondary | ICD-10-CM | POA: Diagnosis not present

## 2016-11-07 DIAGNOSIS — G40909 Epilepsy, unspecified, not intractable, without status epilepticus: Secondary | ICD-10-CM | POA: Diagnosis not present

## 2016-11-07 DIAGNOSIS — E119 Type 2 diabetes mellitus without complications: Secondary | ICD-10-CM | POA: Insufficient documentation

## 2016-11-07 DIAGNOSIS — R51 Headache: Secondary | ICD-10-CM | POA: Diagnosis present

## 2016-11-07 DIAGNOSIS — R519 Headache, unspecified: Secondary | ICD-10-CM

## 2016-11-07 DIAGNOSIS — R569 Unspecified convulsions: Secondary | ICD-10-CM

## 2016-11-07 LAB — URINALYSIS, ROUTINE W REFLEX MICROSCOPIC
Bilirubin Urine: NEGATIVE
Glucose, UA: >1000 mg/dL — AB
Hgb urine dipstick: NEGATIVE
Ketones, ur: NEGATIVE mg/dL
Leukocytes, UA: NEGATIVE
Nitrite: NEGATIVE
Protein, ur: NEGATIVE mg/dL
Specific Gravity, Urine: 1.028 (ref 1.005–1.030)
pH: 5.5 (ref 5.0–8.0)

## 2016-11-07 LAB — CBC WITH DIFFERENTIAL/PLATELET
Basophils Absolute: 0 10*3/uL (ref 0.0–0.1)
Basophils Relative: 0 %
Eosinophils Absolute: 0.1 10*3/uL (ref 0.0–0.7)
Eosinophils Relative: 2 %
HCT: 40.4 % (ref 39.0–52.0)
Hemoglobin: 13.3 g/dL (ref 13.0–17.0)
Lymphocytes Relative: 36 %
Lymphs Abs: 2.1 10*3/uL (ref 0.7–4.0)
MCH: 28.2 pg (ref 26.0–34.0)
MCHC: 32.9 g/dL (ref 30.0–36.0)
MCV: 85.8 fL (ref 78.0–100.0)
Monocytes Absolute: 0.4 10*3/uL (ref 0.1–1.0)
Monocytes Relative: 6 %
Neutro Abs: 3.2 10*3/uL (ref 1.7–7.7)
Neutrophils Relative %: 56 %
Platelets: 150 10*3/uL (ref 150–400)
RBC: 4.71 MIL/uL (ref 4.22–5.81)
RDW: 15 % (ref 11.5–15.5)
WBC: 5.8 10*3/uL (ref 4.0–10.5)

## 2016-11-07 LAB — COMPREHENSIVE METABOLIC PANEL
ALT: 61 U/L (ref 17–63)
AST: 42 U/L — ABNORMAL HIGH (ref 15–41)
Albumin: 4.5 g/dL (ref 3.5–5.0)
Alkaline Phosphatase: 59 U/L (ref 38–126)
Anion gap: 7 (ref 5–15)
BUN: 18 mg/dL (ref 6–20)
CO2: 26 mmol/L (ref 22–32)
Calcium: 10 mg/dL (ref 8.9–10.3)
Chloride: 109 mmol/L (ref 101–111)
Creatinine, Ser: 1.33 mg/dL — ABNORMAL HIGH (ref 0.61–1.24)
GFR calc Af Amer: >60 mL/min (ref >60)
GFR calc non Af Amer: 59 mL/min — ABNORMAL LOW (ref >60)
Glucose, Bld: 211 mg/dL — ABNORMAL HIGH (ref 65–99)
Potassium: 3.5 mmol/L (ref 3.5–5.1)
Sodium: 142 mmol/L (ref 135–145)
Total Bilirubin: 0.5 mg/dL (ref 0.3–1.2)
Total Protein: 7.4 g/dL (ref 6.5–8.1)

## 2016-11-07 LAB — URINE MICROSCOPIC-ADD ON

## 2016-11-07 LAB — ETHANOL: Alcohol, Ethyl (B): <5 mg/dL (ref <5)

## 2016-11-07 LAB — CBG MONITORING, ED: Glucose-Capillary: 208 mg/dL — ABNORMAL HIGH (ref 65–99)

## 2016-11-07 MED ORDER — SODIUM CHLORIDE 0.9 % IV BOLUS (SEPSIS)
500.0000 mL | Freq: Once | INTRAVENOUS | Status: AC
  Administered 2016-11-07: 500 mL via INTRAVENOUS

## 2016-11-07 MED ORDER — LEVETIRACETAM 500 MG/5ML IV SOLN
INTRAVENOUS | Status: AC
  Filled 2016-11-07: qty 5

## 2016-11-07 MED ORDER — METOCLOPRAMIDE HCL 5 MG/ML IJ SOLN
10.0000 mg | Freq: Once | INTRAMUSCULAR | Status: AC
  Administered 2016-11-07: 10 mg via INTRAVENOUS
  Filled 2016-11-07: qty 2

## 2016-11-07 MED ORDER — IPRATROPIUM-ALBUTEROL 0.5-2.5 (3) MG/3ML IN SOLN
3.0000 mL | Freq: Once | RESPIRATORY_TRACT | Status: AC
  Administered 2016-11-07: 3 mL via RESPIRATORY_TRACT
  Filled 2016-11-07: qty 3

## 2016-11-07 MED ORDER — DIPHENHYDRAMINE HCL 50 MG/ML IJ SOLN
25.0000 mg | Freq: Once | INTRAMUSCULAR | Status: AC
  Administered 2016-11-07: 25 mg via INTRAVENOUS
  Filled 2016-11-07: qty 1

## 2016-11-07 MED ORDER — LORAZEPAM 2 MG/ML IJ SOLN
INTRAMUSCULAR | Status: AC
  Filled 2016-11-07: qty 1

## 2016-11-07 MED ORDER — LORAZEPAM 2 MG/ML IJ SOLN
1.0000 mg | Freq: Once | INTRAMUSCULAR | Status: AC
  Administered 2016-11-07: 1 mg via INTRAVENOUS
  Filled 2016-11-07: qty 1

## 2016-11-07 MED ORDER — SODIUM CHLORIDE 0.9 % IV SOLN
1000.0000 mg | Freq: Once | INTRAVENOUS | Status: AC
  Administered 2016-11-07: 1000 mg via INTRAVENOUS
  Filled 2016-11-07: qty 10

## 2016-11-07 MED ORDER — HYDROCODONE-ACETAMINOPHEN 5-325 MG PO TABS: 1.0000 | ORAL_TABLET | Freq: Once | ORAL | Status: DC

## 2016-11-07 NOTE — ED Triage Notes (Signed)
Sore throat, cough, HA x 2-3 days.  Pt unsteady on his feet in triage.

## 2016-11-07 NOTE — ED Notes (Signed)
Clonic- Tonic Sz activity noted for 1-2 min, ED MD informed .

## 2016-11-07 NOTE — ED Notes (Signed)
Pt on monitor 

## 2016-11-07 NOTE — ED Notes (Signed)
Given po fluids, denies h/a. No longer having double vision and alert and oriented x 3, family at bedside

## 2016-11-07 NOTE — ED Provider Notes (Signed)
[facility] Note   CSN: [id number] Arrival date & time: 11/07/16  1937  By signing my name below, I, Teofilo Pod, attest that this documentation has been prepared under the direction and in the presence of Tilden Fossa, MD . Electronically Signed: Teofilo Pod, ED Scribe. 11/07/2016. 8:10 PM.    History   Chief Complaint Chief Complaint  Patient presents with  . Headache    The history is provided by the patient. No language interpreter was used.   HPI Comments:  Nicholas [name] is a 55 y.o. male who presents to the Emergency Department complaining of constant right-sided headache x 2-3 days. The history is provided by the patient and his wife. Level V caveat due to confusion. Pt complains of associated double vision, difficulty walking, difficulty talking, sore throat and cough. Pt states that the headache is similar to his previous migraines. Pt denies new medications, alcohol or drug use. No alleviating factors noted. Pt denies fever, vomiting.  The headache has been present for several days and the double vision and speech difficulties have begun just prior to ED arrival. His wife states that he often behaves like this just prior to developing seizures.  Past Medical History:  Diagnosis Date  . Asthma   . Bipolar 1 disorder (HCC)   . Borderline glaucoma   . Epididymal pain    LEFT  . 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 Feeling of incomplete bladder emptying   . Frequency of urination   . Gastric ulcer   . GERD (gastroesophageal reflux disease)   . Hypertension   . Hyperthyroidism    NO MEDS   . Migraine   . Seizures (HCC)   . Type 2 diabetes mellitus [facility])     Patient Active Problem List   Diagnosis Date Noted  . Testicular/scrotal pain 11/09/2013  . Microhematuria 11/09/2013  . Condyloma acuminatum of scrotum 11/09/2013    Past  Surgical History:  Procedure Laterality Date  . ANTERIOR CERVICAL DECOMP/DISCECTOMY FUSION  2007   C4  --  C6  . CERVICAL FUSION    . CHOLECYSTECTOMY    . CYSTOSCOPY N/A 11/09/2013   Procedure: CYSTOSCOPY FLEXIBLE;  Surgeon: Bjorn Pippin, MD;  [facility];  Service: Urology;  Laterality: N/A;  . EPIDIDYMECTOMY Left 11/09/2013   Procedure:  LEFT EPIDIDYMECTOMY;  Surgeon: Bjorn Pippin, MD;  Location: [facility];  Service: Urology;  Laterality: Left;  POSSIBLE OUTPATIENT WITH OBSERVATION  . EXCISION LIPOMA LEFT SHOULDER  2004 (APPROX)  . MULTIPLE CYST REMOVED FROM CHEST  AGE 75  . TESTICLE REMOVAL Left        Home Medications    Prior to Admission medications   Medication Sig Start Date End Date Taking? Authorizing Provider  amitriptyline (ELAVIL) 25 MG tablet Take 25 mg by mouth at bedtime.    Historical Provider, MD  atorvastatin (LIPITOR) 80 MG tablet Take 80 mg by mouth daily.    Historical Provider, MD  cyclobenzaprine (FLEXERIL) 10 MG tablet Take 1 tablet (10 mg total) by mouth 2 (two) times daily as needed for muscle spasms. 10/12/15   Courteney Lyn Mackuen, MD  dicyclomine (BENTYL) 20 MG tablet Take 1 tablet (20 mg total) by mouth 2 (two) times daily. 08/29/15   Nicole [name], PA-C  fluticasone-salmeterol (ADVAIR HFA) 409-81 MCG/ACT inhaler Inhale  2 puffs into the lungs daily.    Historical Provider, MD  HYDROcodone-acetaminophen (NORCO/VICODIN) 5-325 MG per tablet Take 1-2 tablets by mouth every 6 hours as needed for pain and/or cough. 08/29/15   Nicole [name], PA-C  ibuprofen (ADVIL,MOTRIN) 800 MG tablet Take 1 tablet (800 mg total) by mouth every 8 (eight) hours as needed. 10/07/16   Marily MemosJason Mesner, MD  LATUDA 60 MG TABS Take 60 mg by mouth daily. 10/03/14   Historical Provider, MD  levETIRAcetam (KEPPRA) 750 MG tablet Take 1 tablet (750 mg total) by mouth 2 (two) times daily. 09/13/16   Christopher Lawyer, PA-C  losartan (COZAAR) 50 MG tablet  Take 50 mg by mouth daily.    Historical Provider, MD  magic mouthwash w/lidocaine SOLN Take 5 mLs by mouth 3 (three) times daily as needed for mouth pain. 10/07/16   Marily MemosJason Mesner, MD  omeprazole (PRILOSEC) 20 MG capsule Take 1 capsule (20 mg total) by mouth 2 (two) times daily. 11/08/14   Geoffery Lyonsouglas Delo, MD  ondansetron (ZOFRAN) 4 MG tablet Take 1 tablet (4 mg total) by mouth every 8 (eight) hours as needed for nausea or vomiting. 08/29/15   [name] [name], PA-C  oxyCODONE-acetaminophen (PERCOCET/ROXICET) 5-325 MG tablet Take 1 tablet by mouth every 6 (six) hours as needed for severe pain. 10/12/15   Courteney Lyn Mackuen, MD  rizatriptan (MAXALT) 10 MG tablet Take 10 mg by mouth as needed for migraine. May repeat in 2 hours if needed    Historical Provider, MD  SITagliptin Phosphate (JANUVIA PO) Take by mouth.    Historical Provider, MD  sucralfate (CARAFATE) 1 GM/10ML suspension Take 10 mLs (1 g total) by mouth 4 (four) times daily -  with meals and at bedtime. 08/29/15   Nicole [name], PA-C  topiramate (TOPAMAX) 100 MG tablet Take 300 mg by mouth every evening.    Historical Provider, MD    Family History History reviewed. No pertinent family history.  Social History Social History  Substance Use Topics  . Smoking status: Former Smoker    Packs/day: 0.25    Years: 15.00    Types: Cigarettes    Quit date: 11/08/1992  . Smokeless tobacco: Never Used  . Alcohol use No     Allergies   Nsaids; Tramadol; Amoxicillin; Ampicillin; Dilantin [phenytoin]; Penicillins; Risperidone and related; Strawberry extract; Bactrim [sulfamethoxazole-trimethoprim]; and Oatmeal   Review of Systems Review of Systems  Constitutional: Negative for fever.  HENT: Positive for sore throat.   Eyes: Positive for visual disturbance.  Respiratory: Positive for cough.   Gastrointestinal: Negative for vomiting.  Musculoskeletal: Positive for gait problem.  Neurological: Positive for speech difficulty and  headaches.  All other systems reviewed and are negative.    Physical Exam Updated Vital Signs BP 126/86   Pulse 98   Temp 98.2 F (36.8 C) (Oral)   Resp (!) 0   Ht 5\' 7"  (1.702 m)   Wt 216 lb (98 kg)   SpO2 93%   BMI 33.83 kg/m   Physical Exam  Constitutional: He appears well-developed and well-nourished.  HENT:  Head: Normocephalic and atraumatic.  Eyes: Pupils are equal, round, and reactive to light.  Cardiovascular: Normal rate and regular rhythm.   No murmur heard. Pulmonary/Chest: Effort normal and breath sounds normal. No respiratory distress.  Abdominal: Soft. There is no tenderness. There is no rebound and no guarding.  Musculoskeletal: He exhibits no edema or tenderness.  Neurological:  Drowsy but arousable. Disoriented to time. Generalized weakness, greatest in the  legs over the arms. Binocular diplopia as well as diplopia in the right eye did not in the left eye. No facial asymmetry. No pronator drift  Skin: Skin is warm and dry.  Psychiatric:  Unable to assess  Nursing note and vitals reviewed.    ED Treatments / Results  DIAGNOSTIC STUDIES:  Oxygen Saturation is 92% on RA, adequate by my interpretation.    COORDINATION OF CARE:  8:10 PM Discussed treatment plan with pt at bedside and pt agreed to plan.   Labs (all labs ordered are listed, but only abnormal results are displayed) Labs Reviewed  COMPREHENSIVE METABOLIC PANEL - Abnormal; Notable for the following:       Result Value   Glucose, Bld 211 (*)    Creatinine, Ser 1.33 (*)    AST 42 (*)    GFR calc non Af Amer 59 (*)    All other components within normal limits  URINALYSIS, ROUTINE W REFLEX MICROSCOPIC (NOT AT [facility]) - Abnormal; Notable for the following:    Glucose, UA >1000 (*)    All other components within normal limits  URINE MICROSCOPIC-ADD ON - Abnormal; Notable for the following:    Squamous Epithelial / LPF 0-5 (*)    Bacteria, UA RARE (*)    All other components within normal  limits  CBG MONITORING, ED - Abnormal; Notable for the following:    Glucose-Capillary 208 (*)    All other components within normal limits  CBC WITH DIFFERENTIAL/PLATELET  ETHANOL  RAPID URINE DRUG SCREEN, HOSP PERFORMED    EKG  EKG Interpretation  Date/Time:  Saturday November 07 2016 20:01:49 EST Ventricular Rate:  108 PR Interval:    QRS Duration: 87 QT Interval:  331 QTC Calculation: 444 R Axis:   -16 Text Interpretation:  Sinus tachycardia Borderline left axis deviation Confirmed by Lincoln Brighamees, Liz [id number]) on 11/07/2016 8:49:56 PM       Radiology Dg Chest 2 View  Result Date: 11/07/2016 CLINICAL DATA:  Right-sided headache sore throat and cough EXAM: CHEST  2 VIEW COMPARISON:  08/13/2016 FINDINGS: Low lung volumes with minimal bibasilar atelectasis. Borderline heart size. No edema. No pneumothorax. No consolidation. Partially visualized lower cervical spine hardware. IMPRESSION: Low lung volumes with mild basilar atelectasis. Borderline cardiomegaly. Electronically Signed   By: Jasmine PangKim  Fujinaga M.D.   On: 11/07/2016 22:12   Ct Head Wo Contrast  Result Date: 11/07/2016 CLINICAL DATA:  Headaches for 2-3 days.  Sore throat and cough. EXAM: CT HEAD WITHOUT CONTRAST TECHNIQUE: Contiguous axial images were obtained from the base of the skull through the vertex without intravenous contrast. COMPARISON:  06/04/2016 FINDINGS: Brain: No evidence of acute infarction, hemorrhage, hydrocephalus, extra-axial collection or mass lesion/mass effect. Vascular: No hyperdense vessel or unexpected calcification. Skull: Normal. Negative for fracture or focal lesion. Sinuses/Orbits: Normal globes and orbits. Visualized sinuses and mastoid air cells are clear. Other: None. IMPRESSION: Normal unenhanced CT scan of the brain. Electronically Signed   By: Amie Portlandavid  Ormond M.D.   On: 11/07/2016 20:44    Procedures Procedures (including critical care time)  Medications Ordered in ED Medications    HYDROcodone-acetaminophen (NORCO/VICODIN) 5-325 MG per tablet 1 tablet (not administered)  levETIRAcetam (KEPPRA) 500 MG/5ML injection (not administered)  levETIRAcetam (KEPPRA) 500 MG/5ML injection (not administered)  LORazepam (ATIVAN) injection 1 mg (1 mg Intravenous Given 11/07/16 2026)  sodium chloride 0.9 % bolus 500 mL (0 mLs Intravenous Stopped 11/07/16 2116)  metoCLOPramide (REGLAN) injection 10 mg (10 mg Intravenous Given 11/07/16 2038)  diphenhydrAMINE (  BENADRYL) injection 25 mg (25 mg Intravenous Given 11/07/16 2038)  ipratropium-albuterol (DUONEB) 0.5-2.5 (3) MG/3ML nebulizer solution 3 mL (3 mLs Nebulization Given 11/07/16 2207)  levETIRAcetam (KEPPRA) 1,000 mg in sodium chloride 0.9 % 100 mL IVPB (1,000 mg Intravenous New Bag/Given 11/07/16 2333)     Initial Impression / Assessment and Plan / ED Course  I have reviewed the triage vital signs and the nursing notes.  Pertinent labs & imaging results that were available during my care of the patient were reviewed by me and considered in my medical decision making (see chart for details).  Clinical Course     Patient with history of migraine headaches as well as seizure disorder here with headache that is typical for his migraines as well as vision changes and speech difficulties. His wife states that this is typical for his headaches. On initial evaluation he did have staring spells where he was not interactive but these were brief and then he became more interactive and did not have any generalized motor activity at that time. He was treated with Ativan for possible seizure as well as Reglan and Benadryl for his headache. CT scan with no acute changes and labs are near his baseline.  On repeat assessment at 2330 the patient's wife came out of the room and states that he is having a generalized seizure. She states that 5-10 minutes prior to her coming out of the room he was having generalized motor activity that she at first described  as a chill. She has difficulty with ambulation and that's why it's occur 5-10 minutes to get out of the room. On assessment of the patient's he has no generalized motor activity but he is more somnolent than previously. He reports headache is with well as feeling poorly. He is slow to answer questions but does answer questions appropriately. He was treated with Keppra load of 1 g. He did miss his evening Keppra dose but denies missing any additional doses (takes 500bid).   On repeat assessment patient feels persistent drowsiness and states he has recurrent diplopia. He has generalized weakness and mild dysarthria on examination. Plan to admit for further monitoring. Patient requests transfer to [facility] as this is where he has had prior care. Discussed with Dr. Doretha Imus at [facility] who accepts the patient in transfer.  Following acceptance to [facility] called to room for possible seizure activity. Patient with hands clinching bedrails and staring into space. This continued for several seconds and then he had a few seconds of fine tremor of the right arm and then a few seconds of generalized larger and associated shaking of both upper extremities with heavy breathing. He did not have any change in his heart rate or oxygen sats during the event. He did experience some diaphoresis. Several seconds later he is slow to answer questions and dysarthric, states he needs to urinate.  Entire episode was less than one minute.  Current clinical picture is mixed and unclear for seizure versus pseudoseizure. Current impression is not consistent with CVA, subarachnoid hemorrhage, meningitis. Will treat for potential seizure disorder with additional dose of Ativan.    Final Clinical Impressions(s) / ED Diagnoses   Final diagnoses:  Seizure (HCC)  Bad headache  Transient alteration of awareness    New Prescriptions New Prescriptions   No medications on file  I  personally performed the services described in this documentation, which was scribed in my presence. The recorded information has been  reviewed and is accurate.     Tilden Fossa, MD 11/08/16 [phone]

## 2016-11-07 NOTE — ED Notes (Signed)
Laying on stretcher, no verbal response, staring blankly ahead, would not follow commands. Per wife " He gets like that when he has them seizures" ED MD informed

## 2016-11-07 NOTE — ED Notes (Signed)
Dozing at intervals, remains drowsy, no c/o 's of pain

## 2016-11-07 NOTE — ED Notes (Signed)
Patient is resting comfortably. 

## 2016-11-07 NOTE — ED Notes (Signed)
Patient transported to CT 

## 2016-11-08 DIAGNOSIS — I10 Essential (primary) hypertension: Secondary | ICD-10-CM | POA: Diagnosis not present

## 2016-11-08 DIAGNOSIS — G40909 Epilepsy, unspecified, not intractable, without status epilepticus: Secondary | ICD-10-CM | POA: Diagnosis not present

## 2016-11-08 DIAGNOSIS — Z79899 Other long term (current) drug therapy: Secondary | ICD-10-CM | POA: Diagnosis not present

## 2016-11-08 DIAGNOSIS — J029 Acute pharyngitis, unspecified: Secondary | ICD-10-CM | POA: Diagnosis not present

## 2016-11-08 DIAGNOSIS — J45909 Unspecified asthma, uncomplicated: Secondary | ICD-10-CM | POA: Diagnosis not present

## 2016-11-08 DIAGNOSIS — R51 Headache: Secondary | ICD-10-CM | POA: Diagnosis present

## 2016-11-08 DIAGNOSIS — Z87891 Personal history of nicotine dependence: Secondary | ICD-10-CM | POA: Diagnosis not present

## 2016-11-08 DIAGNOSIS — E119 Type 2 diabetes mellitus without complications: Secondary | ICD-10-CM | POA: Diagnosis not present

## 2016-11-08 LAB — RAPID URINE DRUG SCREEN, HOSP PERFORMED
Amphetamines: NOT DETECTED
Barbiturates: NOT DETECTED
Benzodiazepines: NOT DETECTED
Cocaine: NOT DETECTED
Opiates: NOT DETECTED
Tetrahydrocannabinol: NOT DETECTED

## 2016-11-08 LAB — CBG MONITORING, ED: Glucose-Capillary: 302 mg/dL — ABNORMAL HIGH (ref 65–99)

## 2016-11-08 MED ORDER — LORAZEPAM 2 MG/ML IJ SOLN
0.5000 mg | Freq: Once | INTRAMUSCULAR | Status: AC
  Administered 2016-11-08: 0.5 mg via INTRAVENOUS
  Filled 2016-11-08: qty 1

## 2016-11-08 NOTE — ED Notes (Signed)
Bed Assigned at [facility] per nursing supervisor Misty StanleyLisa, room 700, RN aware

## 2016-11-25 ENCOUNTER — Encounter (HOSPITAL_BASED_OUTPATIENT_CLINIC_OR_DEPARTMENT_OTHER): Payer: Self-pay | Admitting: *Deleted

## 2016-11-25 ENCOUNTER — Emergency Department (HOSPITAL_BASED_OUTPATIENT_CLINIC_OR_DEPARTMENT_OTHER)
Admission: EM | Admit: 2016-11-25 | Discharge: 2016-11-25 | Disposition: A | Discharge status: Home / Self Care | Payer: Medicare Other | Attending: Emergency Medicine | Admitting: Emergency Medicine

## 2016-11-25 DIAGNOSIS — I10 Essential (primary) hypertension: Secondary | ICD-10-CM | POA: Diagnosis not present

## 2016-11-25 DIAGNOSIS — Z794 Long term (current) use of insulin: Secondary | ICD-10-CM | POA: Insufficient documentation

## 2016-11-25 DIAGNOSIS — J45909 Unspecified asthma, uncomplicated: Secondary | ICD-10-CM | POA: Diagnosis not present

## 2016-11-25 DIAGNOSIS — Z79891 Long term (current) use of opiate analgesic: Secondary | ICD-10-CM | POA: Diagnosis not present

## 2016-11-25 DIAGNOSIS — E119 Type 2 diabetes mellitus without complications: Secondary | ICD-10-CM | POA: Diagnosis not present

## 2016-11-25 DIAGNOSIS — R339 Retention of urine, unspecified: Secondary | ICD-10-CM

## 2016-11-25 DIAGNOSIS — Z79899 Other long term (current) drug therapy: Secondary | ICD-10-CM | POA: Insufficient documentation

## 2016-11-25 LAB — BASIC METABOLIC PANEL
Anion gap: 7 (ref 5–15)
BUN: 16 mg/dL (ref 6–20)
CO2: 26 mmol/L (ref 22–32)
Calcium: 8.8 mg/dL — ABNORMAL LOW (ref 8.9–10.3)
Chloride: 106 mmol/L (ref 101–111)
Creatinine, Ser: 1.45 mg/dL — ABNORMAL HIGH (ref 0.61–1.24)
GFR calc Af Amer: >60 mL/min (ref >60)
GFR calc non Af Amer: 53 mL/min — ABNORMAL LOW (ref >60)
Glucose, Bld: 151 mg/dL — ABNORMAL HIGH (ref 65–99)
Potassium: 3.7 mmol/L (ref 3.5–5.1)
Sodium: 139 mmol/L (ref 135–145)

## 2016-11-25 LAB — CBC WITH DIFFERENTIAL/PLATELET
Basophils Absolute: 0 10*3/uL (ref 0.0–0.1)
Basophils Relative: 0 %
Eosinophils Absolute: 0 10*3/uL (ref 0.0–0.7)
Eosinophils Relative: 0 %
HCT: 35.5 % — ABNORMAL LOW (ref 39.0–52.0)
Hemoglobin: 11.5 g/dL — ABNORMAL LOW (ref 13.0–17.0)
Lymphocytes Relative: 17 %
Lymphs Abs: 2 10*3/uL (ref 0.7–4.0)
MCH: 27.8 pg (ref 26.0–34.0)
MCHC: 32.4 g/dL (ref 30.0–36.0)
MCV: 86 fL (ref 78.0–100.0)
Monocytes Absolute: 0.9 10*3/uL (ref 0.1–1.0)
Monocytes Relative: 8 %
Neutro Abs: 8.8 10*3/uL — ABNORMAL HIGH (ref 1.7–7.7)
Neutrophils Relative %: 75 %
Platelets: 139 10*3/uL — ABNORMAL LOW (ref 150–400)
RBC: 4.13 MIL/uL — ABNORMAL LOW (ref 4.22–5.81)
RDW: 14.8 % (ref 11.5–15.5)
WBC: 11.7 10*3/uL — ABNORMAL HIGH (ref 4.0–10.5)

## 2016-11-25 LAB — URINALYSIS, ROUTINE W REFLEX MICROSCOPIC
Bilirubin Urine: NEGATIVE
Glucose, UA: 250 mg/dL — AB
Hgb urine dipstick: NEGATIVE
Ketones, ur: NEGATIVE mg/dL
Leukocytes, UA: NEGATIVE
Nitrite: NEGATIVE
Protein, ur: NEGATIVE mg/dL
Specific Gravity, Urine: 1.02 (ref 1.005–1.030)
pH: 5.5 (ref 5.0–8.0)

## 2016-11-25 MED ORDER — LIDOCAINE HCL 2 % EX GEL
CUTANEOUS | Status: AC
  Administered 2016-11-25: 20
  Filled 2016-11-25: qty 20

## 2016-11-25 NOTE — ED Provider Notes (Signed)
[facility] Provider Note   CSN: [id number] Arrival date & time: 11/25/16  4098     History   Chief Complaint Chief Complaint  Patient presents with  . Urinary Retention    HPI Nicholas [name] is a 55 y.o. male.  55 year old male here with urinary retention after having surgery for hemorrhoids yesterday. Also had one episode of bright red blood per rectum earlier in the evening. Has not had any urine output since yesterday. Has developed little bit of right-sided back pain as well. No midline back pain. No numbness or weakness in his legs. No fevers, chills, nausea or vomiting. No history of urinary retention but does relay something about fluid backed up on his kidneys when he was 55 years old. No known history of BPH.      Past Medical History:  Diagnosis Date  . Asthma   . Bipolar 1 disorder (HCC)   . Borderline glaucoma   . Epididymal pain    LEFT  . 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 Feeling of incomplete bladder emptying   . Frequency of urination   . Gastric ulcer   . GERD (gastroesophageal reflux disease)   . Hypertension   . Hyperthyroidism    NO MEDS   . Migraine   . Seizures (HCC)   . Type 2 diabetes mellitus [facility])     Patient Active Problem List   Diagnosis Date Noted  . Testicular/scrotal pain 11/09/2013  . Microhematuria 11/09/2013  . Condyloma acuminatum of scrotum 11/09/2013    Past Surgical History:  Procedure Laterality Date  . ANTERIOR CERVICAL DECOMP/DISCECTOMY FUSION  2007   C4  --  C6  . CERVICAL FUSION    . CHOLECYSTECTOMY    . CYSTOSCOPY N/A 11/09/2013   Procedure: CYSTOSCOPY FLEXIBLE;  Surgeon: Bjorn Pippin, MD;  Location: [facility];  Service: Urology;  Laterality: N/A;  . EPIDIDYMECTOMY Left 11/09/2013   Procedure:  LEFT EPIDIDYMECTOMY;  Surgeon: Bjorn Pippin, MD;  Location: [facility];  Service:  Urology;  Laterality: Left;  POSSIBLE OUTPATIENT WITH OBSERVATION  . EXCISION LIPOMA LEFT SHOULDER  2004 (APPROX)  . MULTIPLE CYST REMOVED FROM CHEST  AGE 41  . TESTICLE REMOVAL Left        Home Medications    Prior to Admission medications   Medication Sig Start Date End Date Taking? Authorizing Provider  insulin glargine (LANTUS) 100 UNIT/ML injection Inject 35 Units into the skin at bedtime.   Yes Historical Provider, MD  metFORMIN (GLUCOPHAGE) 500 MG tablet Take 500 mg by mouth 2 (two) times daily with a meal.   Yes Historical Provider, MD  amitriptyline (ELAVIL) 25 MG tablet Take 25 mg by mouth at bedtime.    Historical Provider, MD  atorvastatin (LIPITOR) 80 MG tablet Take 80 mg by mouth daily.    Historical Provider, MD  fluticasone-salmeterol (ADVAIR HFA) 115-21 MCG/ACT inhaler Inhale 2 puffs into the lungs daily.    Historical Provider, MD  HYDROcodone-acetaminophen (NORCO/VICODIN) 5-325 MG per tablet Take 1-2 tablets by mouth every 6 hours as needed for pain and/or cough. 08/29/15   Nicole [name], PA-C  ibuprofen (ADVIL,MOTRIN) 800 MG tablet Take 1 tablet (800 mg total) by mouth every 8 (eight) hours as needed. 10/07/16   Marily Memos, MD  levETIRAcetam (KEPPRA) 750 MG tablet Take 1 tablet (750 mg total)  by mouth 2 (two) times daily. 09/13/16   Christopher Lawyer, PA-C  losartan (COZAAR) 50 MG tablet Take 50 mg by mouth daily.    Historical Provider, MD  omeprazole (PRILOSEC) 20 MG capsule Take 1 capsule (20 mg total) by mouth 2 (two) times daily. 11/08/14   Geoffery Lyonsouglas Delo, MD  oxyCODONE-acetaminophen (PERCOCET/ROXICET) 5-325 MG tablet Take 1 tablet by mouth every 6 (six) hours as needed for severe pain. 10/12/15   Courteney Lyn Mackuen, MD  rizatriptan (MAXALT) 10 MG tablet Take 10 mg by mouth as needed for migraine. May repeat in 2 hours if needed    Historical Provider, MD  sucralfate (CARAFATE) 1 GM/10ML suspension Take 10 mLs (1 g total) by mouth 4 (four) times daily -  with meals  and at bedtime. 08/29/15   [name] [name], PA-C    Family History History reviewed. No pertinent family history.  Social History Social History  Substance Use Topics  . Smoking status: Former Smoker    Packs/day: 0.25    Years: 15.00    Types: Cigarettes    Quit date: 11/08/1992  . Smokeless tobacco: Never Used  . Alcohol use No     Allergies   Nsaids; Tramadol; Amoxicillin; Ampicillin; Dilantin [phenytoin]; Penicillins; Risperidone and related; Strawberry extract; Bactrim [sulfamethoxazole-trimethoprim]; and Oatmeal   Review of Systems Review of Systems  All other systems reviewed and are negative.    Physical Exam Updated Vital Signs BP 133/90 (BP Location: Right Arm)   Pulse 114   Temp 98.8 F (37.1 C) (Oral)   Resp 20   Ht 5\' 7"  (1.702 m)   Wt 212 lb (96.2 kg)   SpO2 94%   BMI 33.20 kg/m   Physical Exam  Constitutional: He is oriented to person, place, and time. He appears well-developed and well-nourished.  HENT:  Head: Normocephalic and atraumatic.  Eyes: Conjunctivae are normal.  Neck: Normal range of motion.  Cardiovascular: Normal rate.   Pulmonary/Chest: Effort normal. No respiratory distress.  Abdominal: He exhibits no distension. There is no tenderness (on my exam, catheter in place and in no distress/pain).  Genitourinary:  Genitourinary Comments: Minimal brbpr, no active hemorrhaging or extravasation. Some dried blood on the pad he is wearing in underwear  Musculoskeletal: Normal range of motion.  Neurological: He is alert and oriented to person, place, and time.  Skin: Skin is warm and dry.  Nursing note and vitals reviewed.    ED Treatments / Results  Labs (all labs ordered are listed, but only abnormal results are displayed) Labs Reviewed  URINALYSIS, ROUTINE W REFLEX MICROSCOPIC - Abnormal; Notable for the following:       Result Value   Glucose, UA 250 (*)    All other components within normal limits  BASIC METABOLIC PANEL -  Abnormal; Notable for the following:    Glucose, Bld 151 (*)    Creatinine, Ser 1.45 (*)    Calcium 8.8 (*)    GFR calc non Af Amer 53 (*)    All other components within normal limits  CBC WITH DIFFERENTIAL/PLATELET - Abnormal; Notable for the following:    WBC 11.7 (*)    RBC 4.13 (*)    Hemoglobin 11.5 (*)    HCT 35.5 (*)    Platelets 139 (*)    Neutro Abs 8.8 (*)    All other components within normal limits  URINE CULTURE    EKG  EKG Interpretation None       Radiology No results found.  Procedures  Procedures (including critical care time)  Medications Ordered in ED Medications  lidocaine (XYLOCAINE) 2 % jelly (20 application  Given 11/25/16 0703)     Initial Impression / Assessment and Plan / ED Course  I have reviewed the triage vital signs and the nursing notes.  Pertinent labs & imaging results that were available during my care of the patient were reviewed by me and considered in my medical decision making (see chart for details).  Clinical Course    Urinary retention. Possibly because of anesthetics, infection, being catheterized or unrecogized BPH. Doubt cauda equina. Will check labs/urine but likely will be discharged with close urology follow up. Labs with mild increase in his creatinine of 0.1. I doubt this is related to an obstructive nephropathy at this time. Patient is pain-free at time of discharge. Will follow-up with urology. We'll also follow with primary doctor in a week to make sure that his kidney function is not getting worse.   Final Clinical Impressions(s) / ED Diagnoses   Final diagnoses:  Urinary retention    New Prescriptions Discharge Medication List as of 11/25/2016  8:45 AM       Marily MemosJason Rula [place], MD 11/25/16 [phone]

## 2016-11-25 NOTE — ED Triage Notes (Signed)
Pt with generalized abd pain urinary retention and rectal bleeding. Pt had hemorrhoid surgery 12/19.

## 2016-11-25 NOTE — ED Notes (Signed)
Pt given work note. Advised to contact his surgeon to clarify when he can return to work. Pt verbalized understanding of foley catheter care (printed instructions provided) and to contact urologist for f/u in the next 2 days. Pt taken to car by wheelchair.Pt's wife is driving.

## 2016-11-25 NOTE — ED Notes (Signed)
Leg bag applied for discharge. 

## 2016-11-26 LAB — URINE CULTURE: Culture: NO GROWTH

## 2016-11-28 ENCOUNTER — Encounter (HOSPITAL_BASED_OUTPATIENT_CLINIC_OR_DEPARTMENT_OTHER): Payer: Self-pay | Admitting: Emergency Medicine

## 2016-11-28 ENCOUNTER — Emergency Department (HOSPITAL_BASED_OUTPATIENT_CLINIC_OR_DEPARTMENT_OTHER)
Admission: EM | Admit: 2016-11-28 | Discharge: 2016-11-28 | Disposition: A | Discharge status: Home / Self Care | Payer: Medicare Other | Attending: Emergency Medicine | Admitting: Emergency Medicine

## 2016-11-28 DIAGNOSIS — I10 Essential (primary) hypertension: Secondary | ICD-10-CM | POA: Insufficient documentation

## 2016-11-28 DIAGNOSIS — E119 Type 2 diabetes mellitus without complications: Secondary | ICD-10-CM | POA: Insufficient documentation

## 2016-11-28 DIAGNOSIS — N4889 Other specified disorders of penis: Secondary | ICD-10-CM | POA: Insufficient documentation

## 2016-11-28 DIAGNOSIS — Z794 Long term (current) use of insulin: Secondary | ICD-10-CM | POA: Diagnosis not present

## 2016-11-28 DIAGNOSIS — J45909 Unspecified asthma, uncomplicated: Secondary | ICD-10-CM | POA: Diagnosis not present

## 2016-11-28 DIAGNOSIS — Y69 Unspecified misadventure during surgical and medical care: Secondary | ICD-10-CM | POA: Insufficient documentation

## 2016-11-28 DIAGNOSIS — T814XXA Infection following a procedure, initial encounter: Secondary | ICD-10-CM | POA: Insufficient documentation

## 2016-11-28 DIAGNOSIS — Z87891 Personal history of nicotine dependence: Secondary | ICD-10-CM | POA: Diagnosis not present

## 2016-11-28 DIAGNOSIS — T8140XA Infection following a procedure, unspecified, initial encounter: Secondary | ICD-10-CM

## 2016-11-28 DIAGNOSIS — R339 Retention of urine, unspecified: Secondary | ICD-10-CM | POA: Diagnosis present

## 2016-11-28 LAB — BASIC METABOLIC PANEL
Anion gap: 7 (ref 5–15)
BUN: 13 mg/dL (ref 6–20)
CO2: 28 mmol/L (ref 22–32)
Calcium: 9.2 mg/dL (ref 8.9–10.3)
Chloride: 105 mmol/L (ref 101–111)
Creatinine, Ser: 1.24 mg/dL (ref 0.61–1.24)
GFR calc Af Amer: >60 mL/min (ref >60)
GFR calc non Af Amer: >60 mL/min (ref >60)
Glucose, Bld: 161 mg/dL — ABNORMAL HIGH (ref 65–99)
Potassium: 4 mmol/L (ref 3.5–5.1)
Sodium: 140 mmol/L (ref 135–145)

## 2016-11-28 LAB — CBC WITH DIFFERENTIAL/PLATELET
Basophils Absolute: 0 10*3/uL (ref 0.0–0.1)
Basophils Relative: 0 %
Eosinophils Absolute: 0.1 10*3/uL (ref 0.0–0.7)
Eosinophils Relative: 2 %
HCT: 36.6 % — ABNORMAL LOW (ref 39.0–52.0)
Hemoglobin: 12 g/dL — ABNORMAL LOW (ref 13.0–17.0)
Lymphocytes Relative: 26 %
Lymphs Abs: 1.5 10*3/uL (ref 0.7–4.0)
MCH: 28.4 pg (ref 26.0–34.0)
MCHC: 32.8 g/dL (ref 30.0–36.0)
MCV: 86.5 fL (ref 78.0–100.0)
Monocytes Absolute: 0.5 10*3/uL (ref 0.1–1.0)
Monocytes Relative: 9 %
Neutro Abs: 3.6 10*3/uL (ref 1.7–7.7)
Neutrophils Relative %: 63 %
Platelets: 162 10*3/uL (ref 150–400)
RBC: 4.23 MIL/uL (ref 4.22–5.81)
RDW: 14.1 % (ref 11.5–15.5)
WBC: 5.7 10*3/uL (ref 4.0–10.5)

## 2016-11-28 LAB — URINALYSIS, ROUTINE W REFLEX MICROSCOPIC
Bilirubin Urine: NEGATIVE
Glucose, UA: 250 mg/dL — AB
Ketones, ur: NEGATIVE mg/dL
Leukocytes, UA: NEGATIVE
Nitrite: NEGATIVE
Protein, ur: 30 mg/dL — AB
Specific Gravity, Urine: 1.021 (ref 1.005–1.030)
pH: 8 (ref 5.0–8.0)

## 2016-11-28 LAB — I-STAT CG4 LACTIC ACID, ED: Lactic Acid, Venous: 1.5 mmol/L (ref 0.5–1.9)

## 2016-11-28 LAB — URINALYSIS, MICROSCOPIC (REFLEX): Squamous Epithelial / LPF: NONE SEEN

## 2016-11-28 MED ORDER — METRONIDAZOLE 500 MG PO TABS: 500.0000 mg | ORAL_TABLET | Freq: Two times a day (BID) | ORAL | 0 refills | Status: DC

## 2016-11-28 MED ORDER — OXYCODONE-ACETAMINOPHEN 5-325 MG PO TABS: 1.0000 | ORAL_TABLET | Freq: Four times a day (QID) | ORAL | 0 refills | Status: DC | PRN

## 2016-11-28 MED ORDER — CIPROFLOXACIN HCL 500 MG PO TABS: 500.0000 mg | ORAL_TABLET | Freq: Two times a day (BID) | ORAL | 0 refills | Status: DC

## 2016-11-28 NOTE — ED Triage Notes (Signed)
Pt had hemorrhoid surgery Tuesday, was seen here Wednesday for urinary retention and had foley cath placed. Pt was told to follow up with urology in 48 hours for removal. They advised him it had to stay in for 7 days. Pt presents today with concerns of infection due to cloudy urine. Also reports rectal bleeding and is concerned of infection from the surgery.

## 2016-11-28 NOTE — ED Provider Notes (Signed)
[facility]   CSN: [id number] Arrival date & time: 11/28/16  1116     History   Chief Complaint Chief Complaint  Patient presents with  . foley catheter problem    HPI Nicholas [name] is a 55 y.o. male recent history of hemorrhoidectomy and following urinary retention with indwelling Foley catheter presents with Foley catheter pain and pain to his surgical site with drainage. Patient had hemorrhoidectomy on 11/24/2016. He was seen in the ED for urinary retention with Foley catheter placed on 11/25/2016. Patient was seen at the urologist yesterday and was told that he needs to keep the Foley catheter in for 1 week. Patient has had pain around the Foley site and some bleeding. Patient reports he is unable to sit directly on his buttocks. Patient denies any fevers, but does endorse some sweats. He denies any chest pain, shortness of breath, abdominal pain, nausea, vomiting.  HPI  Past Medical History:  Diagnosis Date  . Asthma   . Bipolar 1 disorder (HCC)   . Borderline glaucoma   . Epididymal pain    LEFT  . 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 Feeling of incomplete bladder emptying   . Frequency of urination   . Gastric ulcer   . GERD (gastroesophageal reflux disease)   . Hypertension   . Hyperthyroidism    NO MEDS   . Migraine   . Seizures (HCC)   . Type 2 diabetes mellitus [facility])     Patient Active Problem List   Diagnosis Date Noted  . Testicular/scrotal pain 11/09/2013  . Microhematuria 11/09/2013  . Condyloma acuminatum of scrotum 11/09/2013    Past Surgical History:  Procedure Laterality Date  . ANTERIOR CERVICAL DECOMP/DISCECTOMY FUSION  2007   C4  --  C6  . CERVICAL FUSION    . CHOLECYSTECTOMY    . CYSTOSCOPY N/A 11/09/2013   Procedure: CYSTOSCOPY FLEXIBLE;  Surgeon: Bjorn Pippin, MD;  Location[facility];  Service:  Urology;  Laterality: N/A;  . EPIDIDYMECTOMY Left 11/09/2013   Procedure:  LEFT EPIDIDYMECTOMY;  Surgeon: Bjorn Pippin, MD;  Location: [facility];  Service: Urology;  Laterality: Left;  POSSIBLE OUTPATIENT WITH OBSERVATION  . EXCISION LIPOMA LEFT SHOULDER  2004 (APPROX)  . MULTIPLE CYST REMOVED FROM CHEST  AGE 73  . TESTICLE REMOVAL Left        Home Medications    Prior to Admission medications   Medication Sig Start Date End Date Taking? Authorizing Provider  amitriptyline (ELAVIL) 25 MG tablet Take 25 mg by mouth at bedtime.    Historical Provider, MD  atorvastatin (LIPITOR) 80 MG tablet Take 80 mg by mouth daily.    Historical Provider, MD  ciprofloxacin (CIPRO) 500 MG tablet Take 1 tablet (500 mg total) by mouth 2 (two) times daily. 11/28/16   Emi Holes, PA-C  fluticasone-salmeterol (ADVAIR HFA) 409-81 MCG/ACT inhaler Inhale 2 puffs into the lungs daily.    Historical Provider, MD  HYDROcodone-acetaminophen (NORCO/VICODIN) 5-325 MG per tablet Take 1-2 tablets by mouth every 6 hours as needed for pain and/or cough. 08/29/15   Nicole [name], PA-C  ibuprofen (ADVIL,MOTRIN) 800 MG tablet Take 1 tablet (800 mg total) by mouth every 8 (eight) hours as needed. 10/07/16   Marily Memos, MD  insulin glargine (LANTUS) 100 UNIT/ML injection Inject 35 Units into the  skin at bedtime.    Historical Provider, MD  levETIRAcetam (KEPPRA) 750 MG tablet Take 1 tablet (750 mg total) by mouth 2 (two) times daily. 09/13/16   Christopher Lawyer, PA-C  losartan (COZAAR) 50 MG tablet Take 50 mg by mouth daily.    Historical Provider, MD  metFORMIN (GLUCOPHAGE) 500 MG tablet Take 500 mg by mouth 2 (two) times daily with a meal.    Historical Provider, MD  metroNIDAZOLE (FLAGYL) 500 MG tablet Take 1 tablet (500 mg total) by mouth 2 (two) times daily. 11/28/16   Emi HolesAlexandra M Avon Molock, PA-C  omeprazole (PRILOSEC) 20 MG capsule Take 1 capsule (20 mg total) by mouth 2 (two) times daily. 11/08/14   Geoffery Lyonsouglas  Delo, MD  oxyCODONE-acetaminophen (PERCOCET/ROXICET) 5-325 MG tablet Take 1 tablet by mouth every 6 (six) hours as needed for severe pain. 11/28/16   Emi HolesAlexandra M Yesha Muchow, PA-C  rizatriptan (MAXALT) 10 MG tablet Take 10 mg by mouth as needed for migraine. May repeat in 2 hours if needed    Historical Provider, MD  sucralfate (CARAFATE) 1 GM/10ML suspension Take 10 mLs (1 g total) by mouth 4 (four) times daily -  with meals and at bedtime. 08/29/15   [name] [name], PA-C    Family History No family history on file.  Social History Social History  Substance Use Topics  . Smoking status: Former Smoker    Packs/day: 0.25    Years: 15.00    Types: Cigarettes    Quit date: 11/08/1992  . Smokeless tobacco: Never Used  . Alcohol use No     Allergies   Nsaids; Tramadol; Amoxicillin; Ampicillin; Dilantin [phenytoin]; Penicillins; Risperidone and related; Strawberry extract; Bactrim [sulfamethoxazole-trimethoprim]; and Oatmeal   Review of Systems Review of Systems  Constitutional: Negative for chills and fever.  HENT: Negative for facial swelling and sore throat.   Respiratory: Negative for shortness of breath.   Cardiovascular: Negative for chest pain.  Gastrointestinal: Negative for abdominal pain, nausea and vomiting.  Genitourinary: Positive for penile pain (around Foley). Negative for dysuria, scrotal swelling and testicular pain.  Musculoskeletal: Negative for back pain.  Skin: Positive for wound. Negative for rash.  Neurological: Negative for headaches.  Psychiatric/Behavioral: The patient is not nervous/anxious.      Physical Exam Updated Vital Signs BP 123/97   Pulse 81   Temp 98 F (36.7 C) (Oral)   Resp 19   Ht 5\' 7"  (1.702 m)   Wt 96.2 kg   SpO2 98%   BMI 33.20 kg/m   Physical Exam  Constitutional: He appears well-developed and well-nourished. No distress.  HENT:  Head: Normocephalic and atraumatic.  Mouth/Throat: Oropharynx is clear and moist. No oropharyngeal  exudate.  Eyes: Conjunctivae are normal. Pupils are equal, round, and reactive to light. Right eye exhibits no discharge. Left eye exhibits no discharge. No scleral icterus.  Neck: Normal range of motion. Neck supple. No thyromegaly present.  Cardiovascular: Normal rate, regular rhythm, normal heart sounds and intact distal pulses.  Exam reveals no gallop and no friction rub.   No murmur heard. Pulmonary/Chest: Effort normal and breath sounds normal. No stridor. No respiratory distress. He has no wheezes. He has no rales.  Abdominal: Soft. Bowel sounds are normal. He exhibits no distension. There is no tenderness. There is no rebound and no guarding.  Genitourinary: Penile tenderness present.  Genitourinary Comments: Tenderness, purulent drainage surrounding the anus, no abscess palpated Tenderness surrounding Foley catheter  Musculoskeletal: He exhibits no edema.  Lymphadenopathy:    He  has no cervical adenopathy.  Neurological: He is alert. Coordination normal.  Skin: Skin is warm and dry. No rash noted. He is not diaphoretic. No pallor.  Psychiatric: He has a normal mood and affect.  Nursing note and vitals reviewed.    ED Treatments / Results  Labs (all labs ordered are listed, but only abnormal results are displayed) Labs Reviewed  URINALYSIS, ROUTINE W REFLEX MICROSCOPIC - Abnormal; Notable for the following:       Result Value   Glucose, UA 250 (*)    Hgb urine dipstick LARGE (*)    Protein, ur 30 (*)    All other components within normal limits  BASIC METABOLIC PANEL - Abnormal; Notable for the following:    Glucose, Bld 161 (*)    All other components within normal limits  CBC WITH DIFFERENTIAL/PLATELET - Abnormal; Notable for the following:    Hemoglobin 12.0 (*)    HCT 36.6 (*)    All other components within normal limits  URINALYSIS, MICROSCOPIC (REFLEX) - Abnormal; Notable for the following:    Bacteria, UA FEW (*)    All other components within normal limits    I-STAT CG4 LACTIC ACID, ED    EKG  EKG Interpretation None       Radiology No results found.  Procedures Procedures (including critical care time)  Medications Ordered in ED Medications - No data to display   Initial Impression / Assessment and Plan / ED Course  I have reviewed the triage vital signs and the nursing notes.  Pertinent labs & imaging results that were available during my care of the patient were reviewed by me and considered in my medical decision making (see chart for details).  Clinical Course     Patient with infection surrounding the surgical site. CBC shows hemoglobin 12, WBC within normal limits. BMP shows glucose 161. Lactate 1.50. UA shows large hematuria, glucose 250, protein 30, negative leukocytes, few bacteria. Patient evaluated by Dr. Donnald GarrePfeiffer who believes Foley catheter looks well and in place and feels a trial of outpatient treatment is warranted considering patient's stable labs and vitals.. We will treat infection with Cipro and Flagyl. We will also give patient further pain control, short Rx for Percocet. I reviewed patient on [place] narcotic database and found a prescription ending tomorrow. Patient to follow-up with urologist and surgeon next week. Return precautions discussed. Patient understands and agrees with plan. Patient vitals stable throughout ED course and discharged in satisfactory condition.  Final Clinical Impressions(s) / ED Diagnoses   Final diagnoses:  Postoperative infection, initial encounter    New Prescriptions Discharge Medication List as of 11/28/2016  4:37 PM    START taking these medications   Details  ciprofloxacin (CIPRO) 500 MG tablet Take 1 tablet (500 mg total) by mouth 2 (two) times daily., Starting Sat 11/28/2016, Print    metroNIDAZOLE (FLAGYL) 500 MG tablet Take 1 tablet (500 mg total) by mouth 2 (two) times daily., Starting Sat 11/28/2016, Print         Emi Holeslexandra M Jama Krichbaum, PA-C 11/28/16 1709    Arby BarretteMarcy  Pfeiffer, MD 11/29/16 1018

## 2016-11-28 NOTE — Discharge Instructions (Signed)
Medications: Cipro, Flagyl, Percocet  Treatment: Take Cipro and Flagyl twice daily for 7 days. Take Percocet every 6 hours as needed for severe pain. Do not drive or operate machinery when taking this medication. Take ibuprofen as prescribed over-the-counter as needed for mild to moderate pain. Wash the area with 60cc catheter 2-3 times a day.  Follow-up: Please follow-up with urologist and your surgeon next week for follow-up and further evaluation. Please return to emergency department if you develop any new or worsening symptoms.

## 2016-11-29 ENCOUNTER — Encounter (HOSPITAL_BASED_OUTPATIENT_CLINIC_OR_DEPARTMENT_OTHER): Payer: Self-pay | Admitting: *Deleted

## 2016-11-29 ENCOUNTER — Emergency Department (HOSPITAL_BASED_OUTPATIENT_CLINIC_OR_DEPARTMENT_OTHER)
Admission: EM | Admit: 2016-11-29 | Discharge: 2016-11-30 | Disposition: A | Discharge status: Home / Self Care | Payer: Medicare Other | Attending: Emergency Medicine | Admitting: Emergency Medicine

## 2016-11-29 DIAGNOSIS — J45909 Unspecified asthma, uncomplicated: Secondary | ICD-10-CM | POA: Diagnosis not present

## 2016-11-29 DIAGNOSIS — Z79899 Other long term (current) drug therapy: Secondary | ICD-10-CM | POA: Diagnosis not present

## 2016-11-29 DIAGNOSIS — K6289 Other specified diseases of anus and rectum: Secondary | ICD-10-CM | POA: Diagnosis not present

## 2016-11-29 DIAGNOSIS — E119 Type 2 diabetes mellitus without complications: Secondary | ICD-10-CM | POA: Insufficient documentation

## 2016-11-29 DIAGNOSIS — I10 Essential (primary) hypertension: Secondary | ICD-10-CM | POA: Diagnosis not present

## 2016-11-29 DIAGNOSIS — K625 Hemorrhage of anus and rectum: Secondary | ICD-10-CM | POA: Insufficient documentation

## 2016-11-29 DIAGNOSIS — Z87891 Personal history of nicotine dependence: Secondary | ICD-10-CM | POA: Insufficient documentation

## 2016-11-29 DIAGNOSIS — Z794 Long term (current) use of insulin: Secondary | ICD-10-CM | POA: Insufficient documentation

## 2016-11-29 DIAGNOSIS — R110 Nausea: Secondary | ICD-10-CM | POA: Diagnosis not present

## 2016-11-29 NOTE — ED Provider Notes (Signed)
[facility] Provider Note   CSN: [id number] Arrival date & time: 11/29/16  2331 By signing my name below, I, Levon HedgerElizabeth Hall, attest that this documentation has been prepared under the direction and in the presence of Shon Batonourtney F Tobias Avitabile, MD . Electronically Signed: Levon HedgerElizabeth Hall, Scribe. 11/30/2016. 12:15 AM.   History   Chief Complaint Chief Complaint  Patient presents with  . Rectal Bleeding   HPI Valla Leaverdward W Sorn is a 55 y.o. male who presents to the Emergency Department complaining of constant, moderate rectal bleeding which began six days ago s/p hemorrhoidectomy. Per pt, he has "gone through six pairs of underwear today". He also complains of associated rectal pain and nausea. Pt states he had  Hemorrhoidectomy performed by Dr. Micah NoelLance in [place] on 11/24/16. The following day, pt was unable to urinate. He was seen in the ED for this and had foley catheter placed. Pt was seen in the ED yesterday for pain to his foley catheter site as well as rectal bleeding and started on abx which he has taken with no relief. Pt denies any vomiting or fever. Denies dizziness.  The history is provided by the patient. No language interpreter was used.   Past Medical History:  Diagnosis Date  . Asthma   . Bipolar 1 disorder (HCC)   . Borderline glaucoma   . Epididymal pain    LEFT  . 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 Feeling of incomplete bladder emptying   . Frequency of urination   . Gastric ulcer   . GERD (gastroesophageal reflux disease)   . Hypertension   . Hyperthyroidism    NO MEDS   . Migraine   . Seizures (HCC)   . Type 2 diabetes mellitus [facility](HCC)     Patient Active Problem List   Diagnosis Date Noted  . Testicular/scrotal pain 11/09/2013  . Microhematuria 11/09/2013  . Condyloma acuminatum of scrotum 11/09/2013    Past Surgical History:  Procedure Laterality Date    . ANTERIOR CERVICAL DECOMP/DISCECTOMY FUSION  2007   C4  --  C6  . CERVICAL FUSION    . CHOLECYSTECTOMY    . CYSTOSCOPY N/A 11/09/2013   Procedure: CYSTOSCOPY FLEXIBLE;  Surgeon: Bjorn PippinJohn Wrenn, MD;  [facility] [place];  Service: Urology;  Laterality: N/A;  . EPIDIDYMECTOMY Left 11/09/2013   Procedure:  LEFT EPIDIDYMECTOMY;  Surgeon: Bjorn PippinJohn Wrenn, MD;  [facility] [place];  Service: Urology;  Laterality: Left;  POSSIBLE OUTPATIENT WITH OBSERVATION  . EXCISION LIPOMA LEFT SHOULDER  2004 (APPROX)  . MULTIPLE CYST REMOVED FROM CHEST  AGE 55  . OTHER SURGICAL HISTORY     hemorroid surgery   . TESTICLE REMOVAL Left     Home Medications    Prior to Admission medications   Medication Sig Start Date End Date Taking? Authorizing Provider  amitriptyline (ELAVIL) 25 MG tablet Take 25 mg by mouth at bedtime.    Historical Provider, MD  atorvastatin (LIPITOR) 80 MG tablet Take 80 mg by mouth daily.    Historical Provider, MD  ciprofloxacin (CIPRO) 500 MG tablet Take 1 tablet (500 mg total) by mouth 2 (two) times daily. 11/28/16   Emi HolesAlexandra M Law, PA-C  fluticasone-salmeterol (ADVAIR HFA) 725-36115-21 MCG/ACT inhaler Inhale 2 puffs into the lungs daily.    Historical Provider, MD  HYDROcodone-acetaminophen (NORCO/VICODIN) 5-325 MG per tablet Take 1-2  tablets by mouth every 6 hours as needed for pain and/or cough. 08/29/15   Nicole [name], PA-C  ibuprofen (ADVIL,MOTRIN) 800 MG tablet Take 1 tablet (800 mg total) by mouth every 8 (eight) hours as needed. 10/07/16   Marily MemosJason Mesner, MD  insulin glargine (LANTUS) 100 UNIT/ML injection Inject 35 Units into the skin at bedtime.    Historical Provider, MD  levETIRAcetam (KEPPRA) 750 MG tablet Take 1 tablet (750 mg total) by mouth 2 (two) times daily. 09/13/16   Christopher Lawyer, PA-C  losartan (COZAAR) 50 MG tablet Take 50 mg by mouth daily.    Historical Provider, MD  metFORMIN (GLUCOPHAGE) 500 MG tablet Take 500 mg by mouth 2 (two)  times daily with a meal.    Historical Provider, MD  metroNIDAZOLE (FLAGYL) 500 MG tablet Take 1 tablet (500 mg total) by mouth 2 (two) times daily. 11/28/16   Emi HolesAlexandra M Law, PA-C  omeprazole (PRILOSEC) 20 MG capsule Take 1 capsule (20 mg total) by mouth 2 (two) times daily. 11/08/14   Geoffery Lyonsouglas Delo, MD  oxyCODONE-acetaminophen (PERCOCET/ROXICET) 5-325 MG tablet Take 1 tablet by mouth every 6 (six) hours as needed for severe pain. 11/28/16   Emi HolesAlexandra M Law, PA-C  rizatriptan (MAXALT) 10 MG tablet Take 10 mg by mouth as needed for migraine. May repeat in 2 hours if needed    Historical Provider, MD  sucralfate (CARAFATE) 1 GM/10ML suspension Take 10 mLs (1 g total) by mouth 4 (four) times daily -  with meals and at bedtime. 08/29/15   [name] [name], PA-C    Family History No family history on file.  Social History Social History  Substance Use Topics  . Smoking status: Former Smoker    Packs/day: 0.25    Years: 15.00    Types: Cigarettes    Quit date: 11/08/1992  . Smokeless tobacco: Never Used  . Alcohol use No     Allergies   Nsaids; Tramadol; Amoxicillin; Ampicillin; Dilantin [phenytoin]; Penicillins; Risperidone and related; Strawberry extract; Bactrim [sulfamethoxazole-trimethoprim]; and Oatmeal   Review of Systems Review of Systems  Constitutional: Negative for fever.  Gastrointestinal: Positive for anal bleeding, nausea and rectal pain. Negative for vomiting.  All other systems reviewed and are negative.   Physical Exam Updated Vital Signs BP 118/88 (BP Location: Right Arm)   Pulse 77   Temp 98.4 F (36.9 C) (Oral)   Resp 16   Ht 5\' 7"  (1.702 m)   Wt 212 lb (96.2 kg)   SpO2 98%   BMI 33.20 kg/m   Physical Exam  Constitutional: He is oriented to person, place, and time. He appears well-developed and well-nourished.  HENT:  Head: Normocephalic and atraumatic.  Cardiovascular: Normal rate, regular rhythm and normal heart sounds.   No murmur  heard. Pulmonary/Chest: Effort normal and breath sounds normal. No respiratory distress. He has no wheezes.  Abdominal: Soft. There is no tenderness.  Genitourinary:  Genitourinary Comments: Healing hemorrhoid sites about the rectum, small amount of mucoid drainage, no active bleeding, no significant redness or erythema, no induration or fluctuance  Musculoskeletal: He exhibits no edema.  Neurological: He is alert and oriented to person, place, and time.  Skin: Skin is warm and dry.  Psychiatric: He has a normal mood and affect.  Nursing note and vitals reviewed.   ED Treatments / Results  DIAGNOSTIC STUDIES:  Oxygen Saturation is 95% on RA, adequate by my interpretation.    COORDINATION OF CARE:  12:14 AM Discussed treatment plan with pt at bedside  and pt agreed to plan.   Labs (all labs ordered are listed, but only abnormal results are displayed) Labs Reviewed  CBC WITH DIFFERENTIAL/PLATELET - Abnormal; Notable for the following:       Result Value   RBC 4.04 (*)    Hemoglobin 11.4 (*)    HCT 34.7 (*)    All other components within normal limits  COMPREHENSIVE METABOLIC PANEL - Abnormal; Notable for the following:    Glucose, Bld 186 (*)    Creatinine, Ser 1.46 (*)    AST 63 (*)    ALT 131 (*)    GFR calc non Af Amer 52 (*)    All other components within normal limits    EKG  EKG Interpretation None      Radiology Ct Pelvis W Contrast  Result Date: 11/30/2016 CLINICAL DATA:  Several episodes of bloody stools status post hemorrhoid surgery on Tuesday. Difficulty urinating. Complains of nausea but denies vomiting. EXAM: CT PELVIS WITH CONTRAST TECHNIQUE: Multidetector CT imaging of the pelvis was performed using the standard protocol following the bolus administration of intravenous contrast. CONTRAST:  ISOVUE-300 IOPAMIDOL (ISOVUE-300) INJECTION 61% COMPARISON:  06/12/2016 CT FINDINGS: Urinary Tract: Contracted slightly thick-walled bladder with a Foley balloon  in place. Bowel: Normal appendix. No bowel obstruction. Colonic diverticulosis without acute diverticulitis along the distal descending and sigmoid colon. No pericolonic abscess or hematoma. Vascular/Lymphatic: Unremarkable.  No adenopathy. Reproductive:  Normal size prostate. Other: Small fat containing umbilical hernia. No free air or free fluid. Musculoskeletal: No acute osseous abnormality. Slight retrolisthesis of L5 on S1. IMPRESSION: Foley balloon noted within a contracted bladder. This might account for the slightly thick-walled appearance. No bowel obstruction or acute inflammation. Scattered colonic diverticulosis. No perianal/ perirectal abscess, hematoma or fluid collection. Electronically Signed   By: Tollie Eth M.D.   On: 11/30/2016 02:44    Procedures Procedures (including critical care time)  Medications Ordered in ED Medications  iopamidol (ISOVUE-300) 61 % injection 100 mL (100 mLs Intravenous Contrast Given 11/30/16 0213)    Initial Impression / Assessment and Plan / ED Course  I have reviewed the triage vital signs and the nursing notes.  Pertinent labs & imaging results that were available during my care of the patient were reviewed by me and considered in my medical decision making (see chart for details).  Clinical Course as of Nov 30 329  Mon Nov 30, 2016  0124 Laboratory workup reassuring. Hemoglobin stable. Patient reports persistent pain and bleeding. Will obtain CT pelvis evaluate for deep space infection.  [CH]    Clinical Course User Index [CH] Shon Baton, MD    Patient presents with persistent worsening rectal bleeding status post hemorrhoidectomy. Was seen and evaluated yesterday for rectal pain and bleeding. Was started on antibiotics for possible surgical site infection given mucoid drainage. Denies any other infectious symptoms. No active bleeding on exam. Small amount of mucoid drainage. No fluctuance or erythema suggestive of cellulitis or  abscess. Lab work obtained. Hemoglobin at baseline. No leukocytosis. Discussed this with the patient. Given persistence of symptoms, will obtain a CT scan to evaluate for deeper infection. CT scan is negative. Patient reassured. Follow-up with surgeon. Continue sitz baths. If worsening bleeding, he needs immediate reevaluation.  After history, exam, and medical workup I feel the patient has been appropriately medically screened and is safe for discharge home. Pertinent diagnoses were discussed with the patient. Patient was given return precautions.   Final Clinical Impressions(s) / ED Diagnoses  Final diagnoses:  Rectal bleeding    New Prescriptions New Prescriptions   No medications on file   I personally performed the services described in this documentation, which was scribed in my presence. The recorded information has been reviewed and is accurate.    Shon Baton, MD 11/30/16 [phone]

## 2016-11-29 NOTE — ED Triage Notes (Signed)
Pt states that he had a surgery on Tuesday for hemorrhoid. States on Wed morning he was unable to urinate. States he was seen here for same. States they placed a foley which is still inserted. States he will have the cath removed on Tues. Was started on an antibiotic po because he has been having rectal bleeding since his surgery. Pt is concerned because he has had several episodes of bloody stools. C/o nausea. Denies vomiting.

## 2016-11-30 ENCOUNTER — Emergency Department (HOSPITAL_BASED_OUTPATIENT_CLINIC_OR_DEPARTMENT_OTHER): Payer: Medicare Other

## 2016-11-30 DIAGNOSIS — K625 Hemorrhage of anus and rectum: Secondary | ICD-10-CM | POA: Diagnosis not present

## 2016-11-30 LAB — COMPREHENSIVE METABOLIC PANEL
ALT: 131 U/L — ABNORMAL HIGH (ref 17–63)
AST: 63 U/L — ABNORMAL HIGH (ref 15–41)
Albumin: 3.8 g/dL (ref 3.5–5.0)
Alkaline Phosphatase: 68 U/L (ref 38–126)
Anion gap: 8 (ref 5–15)
BUN: 16 mg/dL (ref 6–20)
CO2: 26 mmol/L (ref 22–32)
Calcium: 9.1 mg/dL (ref 8.9–10.3)
Chloride: 107 mmol/L (ref 101–111)
Creatinine, Ser: 1.46 mg/dL — ABNORMAL HIGH (ref 0.61–1.24)
GFR calc Af Amer: >60 mL/min (ref >60)
GFR calc non Af Amer: 52 mL/min — ABNORMAL LOW (ref >60)
Glucose, Bld: 186 mg/dL — ABNORMAL HIGH (ref 65–99)
Potassium: 3.9 mmol/L (ref 3.5–5.1)
Sodium: 141 mmol/L (ref 135–145)
Total Bilirubin: 0.4 mg/dL (ref 0.3–1.2)
Total Protein: 6.8 g/dL (ref 6.5–8.1)

## 2016-11-30 LAB — CBC WITH DIFFERENTIAL/PLATELET
Basophils Absolute: 0 10*3/uL (ref 0.0–0.1)
Basophils Relative: 0 %
Eosinophils Absolute: 0.1 10*3/uL (ref 0.0–0.7)
Eosinophils Relative: 1 %
HCT: 34.7 % — ABNORMAL LOW (ref 39.0–52.0)
Hemoglobin: 11.4 g/dL — ABNORMAL LOW (ref 13.0–17.0)
Lymphocytes Relative: 25 %
Lymphs Abs: 1.4 10*3/uL (ref 0.7–4.0)
MCH: 28.2 pg (ref 26.0–34.0)
MCHC: 32.9 g/dL (ref 30.0–36.0)
MCV: 85.9 fL (ref 78.0–100.0)
Monocytes Absolute: 0.5 10*3/uL (ref 0.1–1.0)
Monocytes Relative: 8 %
Neutro Abs: 3.8 10*3/uL (ref 1.7–7.7)
Neutrophils Relative %: 66 %
Platelets: 158 10*3/uL (ref 150–400)
RBC: 4.04 MIL/uL — ABNORMAL LOW (ref 4.22–5.81)
RDW: 13.8 % (ref 11.5–15.5)
WBC: 5.7 10*3/uL (ref 4.0–10.5)

## 2016-11-30 MED ORDER — IOPAMIDOL (ISOVUE-300) INJECTION 61%
100.0000 mL | Freq: Once | INTRAVENOUS | Status: AC | PRN
  Administered 2016-11-30: 100 mL via INTRAVENOUS

## 2016-11-30 NOTE — Discharge Instructions (Signed)
You were seen today for bleeding. There is no active bleeding on my exam. Given your recent hemorrhoid removal, some bleeding is to be expected. If you have increasing bleeding, pain, fevers or any new or worsening symptoms you need to call your surgeon. Continue sitz baths at home.

## 2017-01-03 ENCOUNTER — Encounter (HOSPITAL_BASED_OUTPATIENT_CLINIC_OR_DEPARTMENT_OTHER): Payer: Self-pay | Admitting: Emergency Medicine

## 2017-01-03 DIAGNOSIS — L03115 Cellulitis of right lower limb: Secondary | ICD-10-CM | POA: Diagnosis not present

## 2017-01-03 DIAGNOSIS — S61012A Laceration without foreign body of left thumb without damage to nail, initial encounter: Secondary | ICD-10-CM | POA: Diagnosis not present

## 2017-01-03 DIAGNOSIS — Y999 Unspecified external cause status: Secondary | ICD-10-CM | POA: Insufficient documentation

## 2017-01-03 DIAGNOSIS — Z87891 Personal history of nicotine dependence: Secondary | ICD-10-CM | POA: Diagnosis not present

## 2017-01-03 DIAGNOSIS — I10 Essential (primary) hypertension: Secondary | ICD-10-CM | POA: Insufficient documentation

## 2017-01-03 DIAGNOSIS — W260XXA Contact with knife, initial encounter: Secondary | ICD-10-CM | POA: Diagnosis not present

## 2017-01-03 DIAGNOSIS — Y929 Unspecified place or not applicable: Secondary | ICD-10-CM | POA: Diagnosis not present

## 2017-01-03 DIAGNOSIS — E119 Type 2 diabetes mellitus without complications: Secondary | ICD-10-CM | POA: Insufficient documentation

## 2017-01-03 DIAGNOSIS — Z79899 Other long term (current) drug therapy: Secondary | ICD-10-CM | POA: Insufficient documentation

## 2017-01-03 DIAGNOSIS — Y939 Activity, unspecified: Secondary | ICD-10-CM | POA: Insufficient documentation

## 2017-01-03 DIAGNOSIS — J45909 Unspecified asthma, uncomplicated: Secondary | ICD-10-CM | POA: Diagnosis not present

## 2017-01-03 DIAGNOSIS — S6992XA Unspecified injury of left wrist, hand and finger(s), initial encounter: Secondary | ICD-10-CM | POA: Diagnosis present

## 2017-01-03 DIAGNOSIS — Z794 Long term (current) use of insulin: Secondary | ICD-10-CM | POA: Insufficient documentation

## 2017-01-03 DIAGNOSIS — Z23 Encounter for immunization: Secondary | ICD-10-CM | POA: Insufficient documentation

## 2017-01-03 NOTE — ED Triage Notes (Signed)
Patient reports that he cut his left thumb today, while he is here he would like his right foot checked since it hurting and it is draining sometimes

## 2017-01-04 ENCOUNTER — Emergency Department (HOSPITAL_BASED_OUTPATIENT_CLINIC_OR_DEPARTMENT_OTHER)
Admission: EM | Admit: 2017-01-04 | Discharge: 2017-01-04 | Disposition: A | Discharge status: Home / Self Care | Payer: Medicare Other | Attending: Emergency Medicine | Admitting: Emergency Medicine

## 2017-01-04 DIAGNOSIS — L03115 Cellulitis of right lower limb: Secondary | ICD-10-CM

## 2017-01-04 DIAGNOSIS — S61012A Laceration without foreign body of left thumb without damage to nail, initial encounter: Secondary | ICD-10-CM | POA: Diagnosis not present

## 2017-01-04 LAB — CBG MONITORING, ED: Glucose-Capillary: 128 mg/dL — ABNORMAL HIGH (ref 65–99)

## 2017-01-04 MED ORDER — ACETAMINOPHEN 500 MG PO TABS
1000.0000 mg | ORAL_TABLET | Freq: Once | ORAL | Status: AC
  Administered 2017-01-04: 1000 mg via ORAL
  Filled 2017-01-04: qty 2

## 2017-01-04 MED ORDER — DOXYCYCLINE HYCLATE 100 MG PO TABS
100.0000 mg | ORAL_TABLET | Freq: Once | ORAL | Status: AC
  Administered 2017-01-04: 100 mg via ORAL
  Filled 2017-01-04: qty 1

## 2017-01-04 MED ORDER — TETANUS-DIPHTH-ACELL PERTUSSIS 5-2.5-18.5 LF-MCG/0.5 IM SUSP
0.5000 mL | Freq: Once | INTRAMUSCULAR | Status: AC
  Administered 2017-01-04: 0.5 mL via INTRAMUSCULAR
  Filled 2017-01-04: qty 0.5

## 2017-01-04 MED ORDER — DOXYCYCLINE HYCLATE 100 MG PO CAPS: 100.0000 mg | ORAL_CAPSULE | Freq: Two times a day (BID) | ORAL | 0 refills | Status: DC

## 2017-01-04 NOTE — ED Provider Notes (Signed)
TIME SEEN: 1:00 AM  CHIEF COMPLAINT: Right foot pain, left thumb laceration  HPI: Pt is a 56 y.o. male with history of asthma, bipolar disorder, seizures, insulin-dependent diabetes who presents to the emergency department with 2 separate complaints. Patient complains of right dorsal foot pain for 2 days. Reports he just went back to work and has been wearing new shoes that were 1 size too small for him. Notice that they were rubbing the top of his foot. This yesterday that he had a small area to the top of his foot that is draining pus. No drainage currently but is red, warm and tender. Reports he is having pain with ambulation. No fevers or chills. No nausea, vomiting or diarrhea. Reports his blood sugars have been running in the 200s.  Patient also states that he has a laceration to the tip of the left thumb. He is right-hand dominant. Last night he was sharpening a Engineer, water and cut himself. States his last tetanus vaccination was 8 years ago. No other injury. Laceration is hemostatic and superficial.  ROS: See HPI Constitutional: no fever  Eyes: no drainage  ENT: no runny nose   Cardiovascular:  no chest pain  Resp: no SOB  GI: no vomiting GU: no dysuria Integumentary: no rash  Allergy: no hives  Musculoskeletal: no leg swelling  Neurological: no slurred speech ROS otherwise negative  PAST MEDICAL HISTORY/PAST SURGICAL HISTORY:  Past Medical History:  Diagnosis Date  . Asthma   . Bipolar 1 disorder (HCC)   . Borderline glaucoma   . Epididymal pain    LEFT  . 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 Feeling of incomplete bladder emptying   . Frequency of urination   . Gastric ulcer   . GERD (gastroesophageal reflux disease)   . Hypertension   . Hyperthyroidism    NO MEDS   . Migraine   . Seizures (HCC)   . Type 2 diabetes mellitus (HCC)     MEDICATIONS:  Prior to Admission  medications   Medication Sig Start Date End Date Taking? Authorizing Provider  amitriptyline (ELAVIL) 25 MG tablet Take 25 mg by mouth at bedtime.    Historical Provider, MD  atorvastatin (LIPITOR) 80 MG tablet Take 80 mg by mouth daily.    Historical Provider, MD  ciprofloxacin (CIPRO) 500 MG tablet Take 1 tablet (500 mg total) by mouth 2 (two) times daily. 11/28/16   Emi Holes, PA-C  fluticasone-salmeterol (ADVAIR HFA) 161-09 MCG/ACT inhaler Inhale 2 puffs into the lungs daily.    Historical Provider, MD  HYDROcodone-acetaminophen (NORCO/VICODIN) 5-325 MG per tablet Take 1-2 tablets by mouth every 6 hours as needed for pain and/or cough. 08/29/15   Nicole [name], PA-C  ibuprofen (ADVIL,MOTRIN) 800 MG tablet Take 1 tablet (800 mg total) by mouth every 8 (eight) hours as needed. 10/07/16   Marily Memos, MD  insulin glargine (LANTUS) 100 UNIT/ML injection Inject 35 Units into the skin at bedtime.    Historical Provider, MD  levETIRAcetam (KEPPRA) 750 MG tablet Take 1 tablet (750 mg total) by mouth 2 (two) times daily. 09/13/16   Christopher Lawyer, PA-C  losartan (COZAAR) 50 MG tablet Take 50 mg by mouth daily.    Historical Provider, MD  metFORMIN (GLUCOPHAGE) 500 MG tablet Take 500 mg by mouth 2 (two) times daily with a meal.    Historical  Provider, MD  metroNIDAZOLE (FLAGYL) 500 MG tablet Take 1 tablet (500 mg total) by mouth 2 (two) times daily. 11/28/16   Emi Holes, PA-C  omeprazole (PRILOSEC) 20 MG capsule Take 1 capsule (20 mg total) by mouth 2 (two) times daily. 11/08/14   [name], MD  oxyCODONE-acetaminophen (PERCOCET/ROXICET) 5-325 MG tablet Take 1 tablet by mouth every 6 (six) hours as needed for severe pain. 11/28/16   Emi Holes, PA-C  rizatriptan (MAXALT) 10 MG tablet Take 10 mg by mouth as needed for migraine. May repeat in 2 hours if needed    Historical Provider, MD  sucralfate (CARAFATE) 1 GM/10ML suspension Take 10 mLs (1 g total) by mouth 4 (four) times daily -   with meals and at bedtime. 08/29/15   Joni Reining [name], PA-C    ALLERGIES:  Allergies  Allergen Reactions  . Nsaids Other (See Comments)    D/t gastric ulcer  . Tramadol Other (See Comments)    GI UPSET (PT HAS ULCER)  . Amoxicillin Other (See Comments)    THRUSH  . Ampicillin Other (See Comments)    THRUSH  . Dilantin [Phenytoin] Other (See Comments)    SEVERE SKIN FLAKING / PEELING  . Penicillins Other (See Comments)    THRUSH  . Risperidone And Related Other (See Comments)    hallucinations  . Strawberry Extract Swelling    LIPS SWELL  . Bactrim [Sulfamethoxazole-Trimethoprim] Hives  . Oatmeal Hives    SOCIAL HISTORY:  Social History  Substance Use Topics  . Smoking status: Former Smoker    Packs/day: 0.25    Years: 15.00    Types: Cigarettes    Quit date: 11/08/1992  . Smokeless tobacco: Never Used  . Alcohol use No    FAMILY HISTORY: History reviewed. No pertinent family history.  EXAM: BP 109/81 (BP Location: Right Arm)   Pulse 90   Temp 97.7 F (36.5 C) (Oral)   Resp 16   Ht 5\' 7"  (1.702 m)   Wt 206 lb (93.4 kg)   SpO2 96%   BMI 32.26 kg/m  CONSTITUTIONAL: Alert and oriented and responds appropriately to questions. Well-appearing; well-nourished HEAD: Normocephalic EYES: Conjunctivae clear, PERRL, EOMI ENT: normal nose; no rhinorrhea; moist mucous membranes NECK: Supple, no meningismus, no nuchal rigidity, no LAD  CARD: RRR; S1 and S2 appreciated; no murmurs, no clicks, no rubs, no gallops RESP: Normal chest excursion without splinting or tachypnea; breath sounds clear and equal bilaterally; no wheezes, no rhonchi, no rales, no hypoxia or respiratory distress, speaking full sentences ABD/GI: Normal bowel sounds; non-distended; soft, non-tender, no rebound, no guarding, no peritoneal signs, no hepatosplenomegaly BACK:  The back appears normal and is non-tender to palpation, there is no CVA tenderness EXT: 2 x 3 cm area of redness and minimal warmth  noted to the dorsal right foot with a small scabbed lesion. No fluctuance or active drainage. No induration. 2+ DP pulse on the right side. No joint effusion or joint involvement. Tender to palpation over this region. Normal ROM in all joints; otherwise extremities are non-tender to palpation; no edema; normal capillary refill; no cyanosis, no calf tenderness or swelling    SKIN: Normal color for age and race; warm; no rash; 3 mm superficial laceration to the tip of the left thumb with no bleeding or drainage, wound edges are well approximated NEURO: Moves all extremities equally, sensation to light touch intact diffusely, cranial nerves II through XII intact, normal speech PSYCH: The patient's mood and manner are  appropriate. Grooming and personal hygiene are appropriate.  MEDICAL DECISION MAKING: Patient here with small area of cellulitis to the right foot. No abscess that needs drainage. No systemic symptoms. Afebrile and very well-appearing here. We'll check his blood sugar here in the emergency department. We'll start him on doxycycline. I do not feel he needs narcotics for pain control. No other injury. I do not feel he needs an x-ray.   Patient also has a small superficial laceration to the distal left thumb. This does not need sutures or Dermabond as it is very small and very superficial. He agrees with this plan. Will update his tetanus vaccination.  ED PROGRESS: Patient's blood glucose is 128. I feel he is safe to do home. We'll discharge with doxycycline. Discussed return precautions. He verbalizes understanding and is comfortable with this plan.   At this time, I do not feel there is any life-threatening condition present. I have reviewed and discussed all results (EKG, imaging, lab, urine as appropriate) and exam findings with patient/family. I have reviewed nursing notes and appropriate previous records.  I feel the patient is safe to be discharged home without further emergent workup and  can continue workup as an outpatient as needed. Discussed usual and customary return precautions. Patient/family verbalize understanding and are comfortable with this plan.  Outpatient follow-up has been provided. All questions have been answered.     Layla MawKristen N Mattie Novosel, DO 01/04/17 0205

## 2017-01-04 NOTE — ED Notes (Signed)
Pt c/o pain to his right foot at d/c. Rates 10/10. MD made aware and orders received.

## 2017-02-13 ENCOUNTER — Encounter (HOSPITAL_BASED_OUTPATIENT_CLINIC_OR_DEPARTMENT_OTHER): Payer: Self-pay | Admitting: Emergency Medicine

## 2017-02-13 ENCOUNTER — Emergency Department (HOSPITAL_BASED_OUTPATIENT_CLINIC_OR_DEPARTMENT_OTHER)
Admission: EM | Admit: 2017-02-13 | Discharge: 2017-02-14 | Disposition: A | Discharge status: Home / Self Care | Payer: Medicare Other | Attending: Emergency Medicine | Admitting: Emergency Medicine

## 2017-02-13 DIAGNOSIS — R05 Cough: Secondary | ICD-10-CM | POA: Insufficient documentation

## 2017-02-13 DIAGNOSIS — R0789 Other chest pain: Secondary | ICD-10-CM | POA: Diagnosis not present

## 2017-02-13 DIAGNOSIS — Z794 Long term (current) use of insulin: Secondary | ICD-10-CM | POA: Diagnosis not present

## 2017-02-13 DIAGNOSIS — J029 Acute pharyngitis, unspecified: Secondary | ICD-10-CM | POA: Diagnosis not present

## 2017-02-13 DIAGNOSIS — E119 Type 2 diabetes mellitus without complications: Secondary | ICD-10-CM | POA: Diagnosis not present

## 2017-02-13 DIAGNOSIS — J111 Influenza due to unidentified influenza virus with other respiratory manifestations: Secondary | ICD-10-CM

## 2017-02-13 DIAGNOSIS — Z79899 Other long term (current) drug therapy: Secondary | ICD-10-CM | POA: Insufficient documentation

## 2017-02-13 DIAGNOSIS — J45909 Unspecified asthma, uncomplicated: Secondary | ICD-10-CM | POA: Diagnosis not present

## 2017-02-13 DIAGNOSIS — Z87891 Personal history of nicotine dependence: Secondary | ICD-10-CM | POA: Diagnosis not present

## 2017-02-13 DIAGNOSIS — R0602 Shortness of breath: Secondary | ICD-10-CM | POA: Diagnosis not present

## 2017-02-13 DIAGNOSIS — R69 Illness, unspecified: Secondary | ICD-10-CM

## 2017-02-13 MED ORDER — IPRATROPIUM-ALBUTEROL 0.5-2.5 (3) MG/3ML IN SOLN
3.0000 mL | Freq: Once | RESPIRATORY_TRACT | Status: AC
  Administered 2017-02-13: 3 mL via RESPIRATORY_TRACT
  Filled 2017-02-13: qty 3

## 2017-02-13 MED ORDER — ACETAMINOPHEN 325 MG PO TABS
650.0000 mg | ORAL_TABLET | Freq: Once | ORAL | Status: AC
  Administered 2017-02-13: 650 mg via ORAL
  Filled 2017-02-13: qty 2

## 2017-02-13 MED ORDER — IPRATROPIUM-ALBUTEROL 0.5-2.5 (3) MG/3ML IN SOLN: 3.0000 mL | RESPIRATORY_TRACT | Status: DC

## 2017-02-13 NOTE — ED Triage Notes (Addendum)
Pt presents to ED with complaints of cough, sore throat, nasal congestion, generalized body aches and shortness of breath that all started today .

## 2017-02-13 NOTE — ED Notes (Signed)
Dr. [name] at BS 

## 2017-02-13 NOTE — ED Notes (Signed)
EDP into room, prior to RN assessment, see MD notes, orders received and initiated.   

## 2017-02-13 NOTE — ED Provider Notes (Signed)
By signing my name below, I, Clarisse Gouge, attest that this documentation has been prepared under the direction and in the presence of Paula Libra, MD. Electronically signed, Clarisse Gouge, ED Scribe. 02/13/17. 11:30 PM.   [facility] Provider Note: Lowella Dell, MD, FACEP  CSN: [id number] MRN: [id number] ARRIVAL: 02/13/17 at 2303 ROOM: MH02/MH02   CHIEF COMPLAINT  Cough   HISTORY OF PRESENT ILLNESS   HPI Comments: Nicholas [name] is a 56 y.o. male with Hx of asthma who presents to the Emergency Department complaining of cough x 6-7 hours. He notes associated sinus pain, sore throat, SOB, chest soreness when he coughs, chills and body aches. He rates his sore throat and body aches as a 10 out of 10. Patient has taken TheraFlu without relief. He does not know if he has had a fever but has felt alternately hot and cold at home. He has a history of asthma but is out of his inhaler.  Past Medical History:  Diagnosis Date  . Asthma   . Bipolar 1 disorder (HCC)   . Borderline glaucoma   . Epididymal pain    LEFT  . 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 Feeling of incomplete bladder emptying   . Frequency of urination   . Gastric ulcer   . GERD (gastroesophageal reflux disease)   . Hypertension   . Hyperthyroidism    NO MEDS   . Migraine   . Seizures (HCC)   . Type 2 diabetes mellitus (HCC)     Past Surgical History:  Procedure Laterality Date  . ANTERIOR CERVICAL DECOMP/DISCECTOMY FUSION  2007   C4  --  C6  . CERVICAL FUSION    . CHOLECYSTECTOMY    . CYSTOSCOPY N/A 11/09/2013   Procedure: CYSTOSCOPY FLEXIBLE;  Surgeon: Bjorn Pippin, MD;  Location: [facility];  Service: Urology;  Laterality: N/A;  . EPIDIDYMECTOMY Left 11/09/2013   Procedure:  LEFT EPIDIDYMECTOMY;  Surgeon: Bjorn Pippin, MD;  Location: [facility];  Service: Urology;   Laterality: Left;  POSSIBLE OUTPATIENT WITH OBSERVATION  . EXCISION LIPOMA LEFT SHOULDER  2004 (APPROX)  . MULTIPLE CYST REMOVED FROM CHEST  AGE 89  . OTHER SURGICAL HISTORY     hemorroid surgery   . TESTICLE REMOVAL Left     No family history on file.  Social History  Substance Use Topics  . Smoking status: Former Smoker    Packs/day: 0.25    Years: 15.00    Types: Cigarettes    Quit date: 11/08/1992  . Smokeless tobacco: Never Used  . Alcohol use No    Prior to Admission medications   Medication Sig Start Date End Date Taking? Authorizing Provider  amitriptyline (ELAVIL) 25 MG tablet Take 25 mg by mouth at bedtime.    Historical Provider, MD  atorvastatin (LIPITOR) 80 MG tablet Take 80 mg by mouth daily.    Historical Provider, MD  ciprofloxacin (CIPRO) 500 MG tablet Take 1 tablet (500 mg total) by mouth 2 (two) times daily. 11/28/16   Emi Holes, PA-C  doxycycline (VIBRAMYCIN) 100 MG capsule Take 1 capsule (100 mg total) by mouth 2 (two) times daily. 01/04/17   Kristen N Ward, DO  fluticasone-salmeterol (ADVAIR HFA) 782-95 MCG/ACT inhaler Inhale 2 puffs into the lungs daily.    Historical Provider, MD  HYDROcodone-acetaminophen (NORCO/VICODIN) 5-325  MG per tablet Take 1-2 tablets by mouth every 6 hours as needed for pain and/or cough. 08/29/15   Nicole [name], PA-C  ibuprofen (ADVIL,MOTRIN) 800 MG tablet Take 1 tablet (800 mg total) by mouth every 8 (eight) hours as needed. 10/07/16   Marily MemosJason Mesner, MD  insulin glargine (LANTUS) 100 UNIT/ML injection Inject 35 Units into the skin at bedtime.    Historical Provider, MD  levETIRAcetam (KEPPRA) 750 MG tablet Take 1 tablet (750 mg total) by mouth 2 (two) times daily. 09/13/16   Christopher Lawyer, PA-C  losartan (COZAAR) 50 MG tablet Take 50 mg by mouth daily.    Historical Provider, MD  metFORMIN (GLUCOPHAGE) 500 MG tablet Take 500 mg by mouth 2 (two) times daily with a meal.    Historical Provider, MD  metroNIDAZOLE (FLAGYL) 500  MG tablet Take 1 tablet (500 mg total) by mouth 2 (two) times daily. 11/28/16   Emi HolesAlexandra M Law, PA-C  omeprazole (PRILOSEC) 20 MG capsule Take 1 capsule (20 mg total) by mouth 2 (two) times daily. 11/08/14   Geoffery Lyonsouglas Delo, MD  oseltamivir (TAMIFLU) 75 MG capsule Take 1 capsule (75 mg total) by mouth every 12 (twelve) hours. 02/14/17   Mckenzie Toruno, MD  oxyCODONE-acetaminophen (PERCOCET/ROXICET) 5-325 MG tablet Take 1 tablet by mouth every 6 (six) hours as needed for severe pain. 11/28/16   Emi HolesAlexandra M Law, PA-C  rizatriptan (MAXALT) 10 MG tablet Take 10 mg by mouth as needed for migraine. May repeat in 2 hours if needed    Historical Provider, MD  sucralfate (CARAFATE) 1 GM/10ML suspension Take 10 mLs (1 g total) by mouth 4 (four) times daily -  with meals and at bedtime. 08/29/15   Nicole [name], PA-C    Allergies Nsaids; Tramadol; Amoxicillin; Ampicillin; Dilantin [phenytoin]; Penicillins; Risperidone and related; Strawberry extract; Bactrim [sulfamethoxazole-trimethoprim]; and Oatmeal   REVIEW OF SYSTEMS  Negative except as noted here or in the History of Present Illness.   PHYSICAL EXAMINATION  Initial Vital Signs Vitals:   02/13/17 2330 02/13/17 2350  BP: 128/95 129/93  Pulse: 93 93  Resp:    Temp:       Examination General: Well-developed, well-nourished male in no acute distress; appearance consistent with age of record HENT: normocephalic; atraumatic; nasal congestion; no pharyngeal erythema or exudate Eyes: pupils equal, round and reactive to light; extraocular muscles intact Neck: supple Heart: regular rate and rhythm Lungs: decreased air movement bilaterally Abdomen: soft; nondistended; nontender; no masses or hepatosplenomegaly; bowel sounds present Extremities: No deformity; full range of motion; pulses normal Neurologic: Awake, alert and oriented; motor function intact in all extremities and symmetric; no facial droop Skin: Warm and dry Psychiatric: Flat  affect   RESULTS  Summary of this visit's results, reviewed by myself:   EKG Interpretation  Date/Time:    Ventricular Rate:    PR Interval:    QRS Duration:   QT Interval:    QTC Calculation:   R Axis:     Text Interpretation:        Laboratory Studies: No results found for this or any previous visit (from the past 24 hour(s)). Imaging Studies: No results found.  ED COURSE  Nursing notes and initial vitals signs, including pulse oximetry, reviewed.  Vitals:   02/13/17 2311 02/13/17 2330 02/13/17 2337 02/13/17 2350  BP:  128/95  129/93  Pulse:  93  93  Resp:      Temp:      TempSrc:      SpO2:  93% 94% 92%  Weight: 210 lb (95.3 kg)     Height: 5\' 7"  (1.702 m)      12:01 AM Air movement improved after DuoNeb treatment. We'll refill his inhaler and start him on Tamiflu.  PROCEDURES    ED DIAGNOSES     ICD-9-CM ICD-10-CM   1. Influenza-like illness 799.89 R69      I personally performed the services described in this documentation, which was scribed in my presence. The recorded information has been reviewed and is accurate.    Paula Libra, MD 02/14/17 0003

## 2017-02-14 DIAGNOSIS — R05 Cough: Secondary | ICD-10-CM | POA: Diagnosis not present

## 2017-02-14 MED ORDER — ALBUTEROL SULFATE HFA 108 (90 BASE) MCG/ACT IN AERS
2.0000 | INHALATION_SPRAY | RESPIRATORY_TRACT | Status: DC | PRN
  Administered 2017-02-14: 2 via RESPIRATORY_TRACT
  Filled 2017-02-14: qty 6.7

## 2017-02-14 MED ORDER — OSELTAMIVIR PHOSPHATE 75 MG PO CAPS: 75.0000 mg | ORAL_CAPSULE | Freq: Two times a day (BID) | ORAL | 0 refills | Status: DC

## 2017-02-14 MED ORDER — OSELTAMIVIR PHOSPHATE 75 MG PO CAPS
75.0000 mg | ORAL_CAPSULE | Freq: Once | ORAL | Status: AC
  Administered 2017-02-14: 75 mg via ORAL
  Filled 2017-02-14: qty 1

## 2017-02-14 NOTE — ED Notes (Signed)
Pt and family member given d/c instructions as per chart. Verbalizes understanding. No questions.

## 2017-04-14 ENCOUNTER — Encounter (HOSPITAL_BASED_OUTPATIENT_CLINIC_OR_DEPARTMENT_OTHER): Payer: Self-pay | Admitting: *Deleted

## 2017-04-14 ENCOUNTER — Emergency Department (HOSPITAL_BASED_OUTPATIENT_CLINIC_OR_DEPARTMENT_OTHER)
Admission: EM | Admit: 2017-04-14 | Discharge: 2017-04-14 | Disposition: A | Discharge status: Home / Self Care | Payer: Medicare Other | Attending: Emergency Medicine | Admitting: Emergency Medicine

## 2017-04-14 DIAGNOSIS — Y939 Activity, unspecified: Secondary | ICD-10-CM | POA: Diagnosis not present

## 2017-04-14 DIAGNOSIS — T310 Burns involving less than 10% of body surface: Secondary | ICD-10-CM

## 2017-04-14 DIAGNOSIS — T22112A Burn of first degree of left forearm, initial encounter: Secondary | ICD-10-CM | POA: Insufficient documentation

## 2017-04-14 DIAGNOSIS — I10 Essential (primary) hypertension: Secondary | ICD-10-CM | POA: Insufficient documentation

## 2017-04-14 DIAGNOSIS — J45909 Unspecified asthma, uncomplicated: Secondary | ICD-10-CM | POA: Diagnosis not present

## 2017-04-14 DIAGNOSIS — X18XXXA Contact with other hot metals, initial encounter: Secondary | ICD-10-CM | POA: Insufficient documentation

## 2017-04-14 DIAGNOSIS — Z87891 Personal history of nicotine dependence: Secondary | ICD-10-CM | POA: Insufficient documentation

## 2017-04-14 DIAGNOSIS — E119 Type 2 diabetes mellitus without complications: Secondary | ICD-10-CM | POA: Insufficient documentation

## 2017-04-14 DIAGNOSIS — Y929 Unspecified place or not applicable: Secondary | ICD-10-CM | POA: Diagnosis not present

## 2017-04-14 DIAGNOSIS — Z794 Long term (current) use of insulin: Secondary | ICD-10-CM | POA: Insufficient documentation

## 2017-04-14 DIAGNOSIS — T22012A Burn of unspecified degree of left forearm, initial encounter: Secondary | ICD-10-CM | POA: Diagnosis present

## 2017-04-14 DIAGNOSIS — Y999 Unspecified external cause status: Secondary | ICD-10-CM | POA: Insufficient documentation

## 2017-04-14 DIAGNOSIS — Z79899 Other long term (current) drug therapy: Secondary | ICD-10-CM | POA: Insufficient documentation

## 2017-04-14 MED ORDER — BACITRACIN ZINC 500 UNIT/GM EX OINT: 1.0000 "application " | TOPICAL_OINTMENT | Freq: Once | CUTANEOUS | Status: DC

## 2017-04-14 MED ORDER — DOXYCYCLINE HYCLATE 100 MG PO CAPS: 100.0000 mg | ORAL_CAPSULE | Freq: Two times a day (BID) | ORAL | 0 refills | Status: DC

## 2017-04-14 MED FILL — DOXYCYCLINE HYCLATE 100 MG: 100 | 7 days supply | Qty: 14 | Fill #0
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
## 2017-04-14 NOTE — ED Notes (Signed)
ED Provider at bedside. 

## 2017-04-14 NOTE — Discharge Instructions (Signed)
Follow up with your primary care doctor, monitor for redness, fever as we discussed, apply antibiotic ointment to the wound daily, keep a dressing on it to keep it covered
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
## 2017-05-04 ENCOUNTER — Encounter (HOSPITAL_BASED_OUTPATIENT_CLINIC_OR_DEPARTMENT_OTHER): Payer: Self-pay

## 2017-05-04 ENCOUNTER — Emergency Department (HOSPITAL_BASED_OUTPATIENT_CLINIC_OR_DEPARTMENT_OTHER): Payer: Medicare Other

## 2017-05-04 ENCOUNTER — Emergency Department (HOSPITAL_BASED_OUTPATIENT_CLINIC_OR_DEPARTMENT_OTHER)
Admission: EM | Admit: 2017-05-04 | Discharge: 2017-05-04 | Disposition: A | Discharge status: Home / Self Care | Payer: Medicare Other | Attending: Emergency Medicine | Admitting: Emergency Medicine

## 2017-05-04 DIAGNOSIS — Z794 Long term (current) use of insulin: Secondary | ICD-10-CM | POA: Diagnosis not present

## 2017-05-04 DIAGNOSIS — Z79899 Other long term (current) drug therapy: Secondary | ICD-10-CM | POA: Diagnosis not present

## 2017-05-04 DIAGNOSIS — G40909 Epilepsy, unspecified, not intractable, without status epilepticus: Secondary | ICD-10-CM | POA: Diagnosis not present

## 2017-05-04 DIAGNOSIS — J45909 Unspecified asthma, uncomplicated: Secondary | ICD-10-CM | POA: Insufficient documentation

## 2017-05-04 DIAGNOSIS — I10 Essential (primary) hypertension: Secondary | ICD-10-CM | POA: Diagnosis not present

## 2017-05-04 DIAGNOSIS — E119 Type 2 diabetes mellitus without complications: Secondary | ICD-10-CM | POA: Diagnosis not present

## 2017-05-04 DIAGNOSIS — G43911 Migraine, unspecified, intractable, with status migrainosus: Secondary | ICD-10-CM | POA: Insufficient documentation

## 2017-05-04 DIAGNOSIS — Z87891 Personal history of nicotine dependence: Secondary | ICD-10-CM | POA: Insufficient documentation

## 2017-05-04 DIAGNOSIS — K529 Noninfective gastroenteritis and colitis, unspecified: Secondary | ICD-10-CM

## 2017-05-04 DIAGNOSIS — R569 Unspecified convulsions: Secondary | ICD-10-CM

## 2017-05-04 DIAGNOSIS — R1110 Vomiting, unspecified: Secondary | ICD-10-CM | POA: Diagnosis present

## 2017-05-04 DIAGNOSIS — G43811 Other migraine, intractable, with status migrainosus: Secondary | ICD-10-CM

## 2017-05-04 LAB — CBC WITH DIFFERENTIAL/PLATELET
Basophils Absolute: 0 10*3/uL (ref 0.0–0.1)
Basophils Relative: 0 %
Eosinophils Absolute: 0.1 10*3/uL (ref 0.0–0.7)
Eosinophils Relative: 2 %
HCT: 40 % (ref 39.0–52.0)
Hemoglobin: 13.3 g/dL (ref 13.0–17.0)
Lymphocytes Relative: 38 %
Lymphs Abs: 2 10*3/uL (ref 0.7–4.0)
MCH: 28.1 pg (ref 26.0–34.0)
MCHC: 33.3 g/dL (ref 30.0–36.0)
MCV: 84.6 fL (ref 78.0–100.0)
Monocytes Absolute: 0.4 10*3/uL (ref 0.1–1.0)
Monocytes Relative: 8 %
Neutro Abs: 2.6 10*3/uL (ref 1.7–7.7)
Neutrophils Relative %: 52 %
Platelets: 148 10*3/uL — ABNORMAL LOW (ref 150–400)
RBC: 4.73 MIL/uL (ref 4.22–5.81)
RDW: 15.1 % (ref 11.5–15.5)
WBC: 5.1 10*3/uL (ref 4.0–10.5)

## 2017-05-04 LAB — COMPREHENSIVE METABOLIC PANEL
ALT: 99 U/L — ABNORMAL HIGH (ref 17–63)
AST: 118 U/L — ABNORMAL HIGH (ref 15–41)
Albumin: 4.5 g/dL (ref 3.5–5.0)
Alkaline Phosphatase: 54 U/L (ref 38–126)
Anion gap: 12 (ref 5–15)
BUN: 17 mg/dL (ref 6–20)
CO2: 24 mmol/L (ref 22–32)
Calcium: 10.1 mg/dL (ref 8.9–10.3)
Chloride: 103 mmol/L (ref 101–111)
Creatinine, Ser: 1.24 mg/dL (ref 0.61–1.24)
GFR calc Af Amer: >60 mL/min (ref >60)
GFR calc non Af Amer: >60 mL/min (ref >60)
Glucose, Bld: 227 mg/dL — ABNORMAL HIGH (ref 65–99)
Potassium: 3.7 mmol/L (ref 3.5–5.1)
Sodium: 139 mmol/L (ref 135–145)
Total Bilirubin: 0.5 mg/dL (ref 0.3–1.2)
Total Protein: 7.6 g/dL (ref 6.5–8.1)

## 2017-05-04 LAB — CBG MONITORING, ED: Glucose-Capillary: 244 mg/dL — ABNORMAL HIGH (ref 65–99)

## 2017-05-04 MED ORDER — DEXAMETHASONE SODIUM PHOSPHATE 10 MG/ML IJ SOLN
10.0000 mg | Freq: Once | INTRAMUSCULAR | Status: AC
  Administered 2017-05-04: 10 mg via INTRAVENOUS
  Filled 2017-05-04: qty 1

## 2017-05-04 MED ORDER — IOPAMIDOL (ISOVUE-300) INJECTION 61%
100.0000 mL | Freq: Once | INTRAVENOUS | Status: AC | PRN
  Administered 2017-05-04: 100 mL via INTRAVENOUS

## 2017-05-04 MED ORDER — SODIUM CHLORIDE 0.9 % IV SOLN
INTRAVENOUS | Status: DC
  Administered 2017-05-04: 13:00:00 via INTRAVENOUS

## 2017-05-04 MED ORDER — PROMETHAZINE HCL 25 MG/ML IJ SOLN
12.5000 mg | Freq: Once | INTRAMUSCULAR | Status: AC
  Administered 2017-05-04: 12.5 mg via INTRAVENOUS
  Filled 2017-05-04: qty 1

## 2017-05-04 MED ORDER — PROMETHAZINE HCL 25 MG PO TABS: 25.0000 mg | ORAL_TABLET | Freq: Four times a day (QID) | ORAL | 0 refills | Status: DC | PRN

## 2017-05-04 MED ORDER — DIPHENHYDRAMINE HCL 50 MG/ML IJ SOLN
25.0000 mg | Freq: Once | INTRAMUSCULAR | Status: AC
  Administered 2017-05-04: 25 mg via INTRAVENOUS
  Filled 2017-05-04: qty 1

## 2017-05-04 MED ORDER — HYDROMORPHONE HCL 1 MG/ML IJ SOLN: 1.0000 mg | Freq: Once | INTRAMUSCULAR | Status: DC

## 2017-05-04 MED ORDER — LOPERAMIDE HCL 2 MG PO TABS: 2.0000 mg | ORAL_TABLET | Freq: Four times a day (QID) | ORAL | 0 refills | Status: DC | PRN

## 2017-05-04 MED ORDER — ONDANSETRON HCL 4 MG/2ML IJ SOLN
4.0000 mg | Freq: Once | INTRAMUSCULAR | Status: AC
  Administered 2017-05-04: 4 mg via INTRAVENOUS
  Filled 2017-05-04: qty 2

## 2017-05-04 MED ORDER — LORAZEPAM 2 MG/ML IJ SOLN
1.0000 mg | Freq: Once | INTRAMUSCULAR | Status: AC
  Administered 2017-05-04: 1 mg via INTRAVENOUS
  Filled 2017-05-04: qty 1

## 2017-05-04 MED ORDER — SODIUM CHLORIDE 0.9 % IV BOLUS (SEPSIS)
1000.0000 mL | Freq: Once | INTRAVENOUS | Status: AC
  Administered 2017-05-04: 1000 mL via INTRAVENOUS

## 2017-05-04 MED FILL — SM ANTI-DIARRHEAL 2 MG CAPL: 2 | 12 days supply | Qty: 48 | Fill #0

## 2017-05-04 MED FILL — PROMETHAZINE 25 MG TABLET: 25 | 5 days supply | Qty: 20 | Fill #0

## 2017-05-04 NOTE — ED Notes (Signed)
Pt ambulatory to the restroom with even, steady gate. Pt states his wife was not coming back to get him as originally planned. Pt agreeable to taxi cab transport home. Charge RN working on it at this time.

## 2017-05-04 NOTE — ED Notes (Signed)
[facility] Pt actively seizing. Dr Deretha EmoryZackowski at bedside

## 2017-05-04 NOTE — ED Notes (Signed)
Pt was slow to respond to answer my ?s-taken to tx area via w/c-pt with difficulty standing-2 person assist to stretcher-report to Desiree HaneJoss Benton, RN

## 2017-05-04 NOTE — ED Notes (Addendum)
Pt wife Massie Bougie(Belinda) leaving the facility and will come back. Wife's number [phone]

## 2017-05-04 NOTE — ED Triage Notes (Signed)
C/o n/v/d x this am-presents to triage in w/c-wife with pt

## 2017-05-04 NOTE — ED Notes (Signed)
Patient transported to CT 

## 2017-05-04 NOTE — ED Notes (Addendum)
Pt alert, oriented x 4, steady gait. Observed walking to pharmacy to pick up Rx and checking out with registration. Pt left ED lobby without need for assist and walked to taxi. After leaving dept pt's cell phone found on charger in ED lobby. Phone labeled and given to security. Pt notified by phone (via taxi service) of location of phone for pick up. Note recorded at 05/04/17 18:20.

## 2017-05-04 NOTE — ED Notes (Signed)
Pt answering questions appropriately at this time. Appears drowsy

## 2017-05-04 NOTE — Discharge Instructions (Signed)
Work note provided. Take the Imodium for the diarrhea take the Phenergan both for the headache and for the nausea and vomiting. Return for vomiting any blood or red blood in the bowel movements. He had Decadron on board which should help the migraine headache over the next 12 hours. Follow-up with your doctors as needed. Return for any new or worse symptoms.

## 2017-05-04 NOTE — ED Provider Notes (Signed)
[facility] Provider Note   CSN: [id number] Arrival date & time: 05/04/17  1213     History   Chief Complaint Chief Complaint  Patient presents with  . Emesis  . Seizures    HPI Nicholas [name] is a 56 y.o. male.  Patient came in for vomiting and diarrhea that started yesterday. Several episodes of vomiting overnight. Shortly after arrival he had a seizure. Consistent with generalized seizures and a period of postictal. Patient has known history of seizures according to his wife. According records patients on Keppra for that. Patient also when he woke up complaining of a headache. Stated headache for several days. Has a history of migraines. But this was different than his usual migraines because he had double vision. Patient also stated for the past week he's had dark bowel movements. No red blood in the vomit no red blood in the bowel movements.      Past Medical History:  Diagnosis Date  . Asthma   . Bipolar 1 disorder (HCC)   . Borderline glaucoma   . Epididymal pain    LEFT  . 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 Feeling of incomplete bladder emptying   . Frequency of urination   . Gastric ulcer   . GERD (gastroesophageal reflux disease)   . Hypertension   . Hyperthyroidism    NO MEDS   . Migraine   . Seizures (HCC)   . Type 2 diabetes mellitus [facility](HCC)     Patient Active Problem List   Diagnosis Date Noted  . Testicular/scrotal pain 11/09/2013  . Microhematuria 11/09/2013  . Condyloma acuminatum of scrotum 11/09/2013    Past Surgical History:  Procedure Laterality Date  . ANTERIOR CERVICAL DECOMP/DISCECTOMY FUSION  2007   C4  --  C6  . CERVICAL FUSION    . CHOLECYSTECTOMY    . CYSTOSCOPY N/A 11/09/2013   Procedure: CYSTOSCOPY FLEXIBLE;  Surgeon: Bjorn PippinJohn Wrenn, MD;  [facility];  Service: Urology;  Laterality: N/A;  .  EPIDIDYMECTOMY Left 11/09/2013   Procedure:  LEFT EPIDIDYMECTOMY;  Surgeon: Bjorn PippinJohn Wrenn, MD;  [facility];  Service: Urology;  Laterality: Left;  POSSIBLE OUTPATIENT WITH OBSERVATION  . EXCISION LIPOMA LEFT SHOULDER  2004 (APPROX)  . MULTIPLE CYST REMOVED FROM CHEST  AGE 69  . OTHER SURGICAL HISTORY     hemorroid surgery   . TESTICLE REMOVAL Left        Home Medications    Prior to Admission medications   Medication Sig Start Date End Date Taking? Authorizing Provider  amitriptyline (ELAVIL) 25 MG tablet Take 25 mg by mouth at bedtime.    [provider]  atorvastatin (LIPITOR) 80 MG tablet Take 80 mg by mouth daily.    [provider]  ciprofloxacin (CIPRO) 500 MG tablet Take 1 tablet (500 mg total) by mouth 2 (two) times daily. 11/28/16   [name], [name], PA-C  doxycycline (VIBRAMYCIN) 100 MG capsule Take 1 capsule (100 mg total) by mouth 2 (two) times daily. 04/14/17   [name], [name], MD  fluticasone-salmeterol (ADVAIR HFA) [phone] MCG/ACT inhaler Inhale 2 puffs into the lungs daily.    [provider]  HYDROcodone-acetaminophen (NORCO/VICODIN) 5-325 MG per tablet Take 1-2 tablets by mouth every 6 hours as needed for pain and/or cough. 08/29/15   [name], [name], PA-C  insulin  glargine (LANTUS) 100 UNIT/ML injection Inject 35 Units into the skin at bedtime.    [provider]  levETIRAcetam (KEPPRA) 750 MG tablet Take 1 tablet (750 mg total) by mouth 2 (two) times daily. 09/13/16   Lawyer, Cristal Deer, PA-C  loperamide (IMODIUM A-D) 2 MG tablet Take 1 tablet (2 mg total) by mouth 4 (four) times daily as needed for diarrhea or loose stools. 05/04/17   Vanetta Mulders, MD  losartan (COZAAR) 50 MG tablet Take 50 mg by mouth daily.    [provider]  metFORMIN (GLUCOPHAGE) 500 MG tablet Take 500 mg by mouth 2 (two) times daily with a meal.    [provider]  metroNIDAZOLE (FLAGYL) 500 MG tablet Take 1 tablet (500 mg  total) by mouth 2 (two) times daily. 11/28/16   Law, [name], PA-C  omeprazole (PRILOSEC) 20 MG capsule Take 1 capsule (20 mg total) by mouth 2 (two) times daily. 11/08/14   [name], MD  oseltamivir (TAMIFLU) 75 MG capsule Take 1 capsule (75 mg total) by mouth every 12 (twelve) hours. 02/14/17   [name], [name], MD  oxyCODONE-acetaminophen (PERCOCET/ROXICET) 5-325 MG tablet Take 1 tablet by mouth every 6 (six) hours as needed for severe pain. 11/28/16   Law, [name], PA-C  promethazine (PHENERGAN) 25 MG tablet Take 1 tablet (25 mg total) by mouth every 6 (six) hours as needed for nausea or vomiting. 05/04/17   Vanetta Mulders, MD  rizatriptan (MAXALT) 10 MG tablet Take 10 mg by mouth as needed for migraine. May repeat in 2 hours if needed    [provider]  sucralfate (CARAFATE) 1 GM/10ML suspension Take 10 mLs (1 g total) by mouth 4 (four) times daily -  with meals and at bedtime. 08/29/15   [name], Mardella Layman    Family History No family history on file.  Social History Social History  Substance Use Topics  . Smoking status: Former Smoker    Packs/day: 0.25    Years: 15.00    Types: Cigarettes    Quit date: 11/08/1992  . Smokeless tobacco: Never Used  . Alcohol use No     Allergies   Nsaids; Tramadol; Amoxicillin; Ampicillin; Dilantin [phenytoin]; Penicillins; Risperidone and related; Strawberry extract; Bactrim [sulfamethoxazole-trimethoprim]; and Oatmeal   Review of Systems Review of Systems  Constitutional: Negative for fever.  HENT: Negative for congestion.   Eyes: Negative for redness.  Respiratory: Negative for shortness of breath.   Cardiovascular: Negative for chest pain.  Gastrointestinal: Positive for abdominal pain, diarrhea, nausea and vomiting.  Genitourinary: Negative for dysuria.  Musculoskeletal: Negative for back pain.  Skin: Negative for rash.  Neurological: Positive for seizures.  Hematological: Does not bruise/bleed easily.    Psychiatric/Behavioral: Negative for confusion.     Physical Exam Updated Vital Signs BP 134/87   Pulse 69   Temp 98.6 F (37 C) (Oral)   Resp 15   SpO2 100%   Physical Exam  Constitutional: He is oriented to person, place, and time. He appears well-developed and well-nourished. No distress.  HENT:  Head: Normocephalic and atraumatic.  Mouth/Throat: Oropharynx is clear and moist.  Eyes: EOM are normal. Pupils are equal, round, and reactive to light.  Neck: Normal range of motion. Neck supple.  Cardiovascular: Normal rate, regular rhythm and normal heart sounds.   No murmur heard. Pulmonary/Chest: Effort normal and breath sounds normal. No respiratory distress.  Abdominal: Soft. Bowel sounds are normal.  Genitourinary: Rectal exam shows guaiac negative stool.  Musculoskeletal: Normal range  of motion. He exhibits no edema.  Neurological: He is alert and oriented to person, place, and time. No cranial nerve deficit or sensory deficit. He exhibits normal muscle tone. Coordination normal.  Skin: Skin is warm.  Nursing note and vitals reviewed.    ED Treatments / Results  Labs (all labs ordered are listed, but only abnormal results are displayed) Labs Reviewed  COMPREHENSIVE METABOLIC PANEL - Abnormal; Notable for the following:       Result Value   Glucose, Bld 227 (*)    AST 118 (*)    ALT 99 (*)    All other components within normal limits  CBC WITH DIFFERENTIAL/PLATELET - Abnormal; Notable for the following:    Platelets 148 (*)    All other components within normal limits  CBG MONITORING, ED - Abnormal; Notable for the following:    Glucose-Capillary 244 (*)    All other components within normal limits    EKG  EKG Interpretation  Date/Time:  Tuesday May 04 2017 12:30:14 EDT Ventricular Rate:  73 PR Interval:    QRS Duration: 101 QT Interval:  382 QTC Calculation: 421 R Axis:   -19 Text Interpretation:  Sinus rhythm Borderline left axis deviation Probable  anteroseptal infarct, old Baseline wander in lead(s) II III aVF Confirmed by Vanetta Mulders [phone]) on 05/04/2017 12:34:59 PM       Radiology Ct Head Wo Contrast  Result Date: 05/04/2017 CLINICAL DATA:  Headache, double vision, seizure, history of seizures, migraines EXAM: CT HEAD WITHOUT CONTRAST TECHNIQUE: Contiguous axial images were obtained from the base of the skull through the vertex without intravenous contrast. Sagittal and coronal MPR images reconstructed from axial data set. COMPARISON:  01/08/2017 FINDINGS: Brain: Normal ventricular morphology. No midline shift or mass effect. Grossly normal appearance of brain parenchyma. No intracranial hemorrhage, mass lesion or evidence acute infarction. No extra-axial fluid collections. Vascular: Unremarkable Skull: Intact Sinuses/Orbits: Clear Other: N/A IMPRESSION: No acute intracranial abnormalities. Electronically Signed   By: Ulyses Southward M.D.   On: 05/04/2017 15:28   Ct Abdomen Pelvis W Contrast  Result Date: 05/04/2017 CLINICAL DATA:  56 year old with nausea, vomiting and diarrhea since this morning. One-week history of black stool and abdominal pain. EXAM: CT ABDOMEN AND PELVIS WITH CONTRAST TECHNIQUE: Multidetector CT imaging of the abdomen and pelvis was performed using the standard protocol following bolus administration of intravenous contrast. CONTRAST:  ISOVUE-300 IOPAMIDOL (ISOVUE-300) INJECTION 61% COMPARISON:  Noncontrast abdominopelvic CT 06/12/2016. Contrasted study 06/04/2016 FINDINGS: Lower chest: Mild atelectasis at both lung bases. There is no pleural or pericardial effusion. Borderline heart size. Hepatobiliary: Severe hepatic steatosis. No focal hepatic abnormality or abnormal enhancement. No biliary dilatation post cholecystectomy. Pancreas: Unremarkable. No pancreatic ductal dilatation or surrounding inflammatory changes. Spleen: Normal in size without focal abnormality. Stable small splenule. Adrenals/Urinary Tract: Both  adrenal glands appear normal. The kidneys appear normal without evidence of urinary tract calculus, suspicious lesion or hydronephrosis. No bladder abnormalities are seen. Stomach/Bowel: No evidence of bowel wall thickening, distention or surrounding inflammatory change. The appendix appears normal. Vascular/Lymphatic: There are no enlarged abdominal or pelvic lymph nodes. Minimal aortic atherosclerosis. No acute vascular findings. Reproductive: The prostate gland and seminal vesicles appear stable without suspicious findings. Vasectomy clips noted in the scrotum. Other: No evidence of abdominal wall mass or hernia. No ascites. Musculoskeletal: No acute or significant osseous findings. IMPRESSION: 1. No acute findings or explanation for the patient's symptoms. 2. Hepatic steatosis.  No biliary dilatation post cholecystectomy. Electronically Signed  By: Carey Bullocks M.D.   On: 05/04/2017 15:35    Procedures Procedures (including critical care time)  Medications Ordered in ED Medications  0.9 %  sodium chloride infusion ( Intravenous New Bag/Given 05/04/17 1241)  HYDROmorphone (DILAUDID) injection 1 mg (not administered)  sodium chloride 0.9 % bolus 1,000 mL (0 mLs Intravenous Stopped 05/04/17 1509)  ondansetron (ZOFRAN) injection 4 mg (4 mg Intravenous Given 05/04/17 1239)  LORazepam (ATIVAN) injection 1 mg (1 mg Intravenous Given 05/04/17 1238)  promethazine (PHENERGAN) injection 12.5 mg (12.5 mg Intravenous Given 05/04/17 1354)  dexamethasone (DECADRON) injection 10 mg (10 mg Intravenous Given 05/04/17 1353)  diphenhydrAMINE (BENADRYL) injection 25 mg (25 mg Intravenous Given 05/04/17 1350)  iopamidol (ISOVUE-300) 61 % injection 100 mL (100 mLs Intravenous Contrast Given 05/04/17 1500)     Initial Impression / Assessment and Plan / ED Course  I have reviewed the triage vital signs and the nursing notes.  Pertinent labs & imaging results that were available during my care of the patient were  reviewed by me and considered in my medical decision making (see chart for details).     Patient with complaint of vomiting and diarrhea starting yesterday. Talked about some not dark bowel movements for the past several days. Shortly after arrival patient had a seizure has a history of generalized seizures. Is on Keppra for that. Patient states he is taking his medicine.  Patient also has a history of migraines. When he woke up from the seizure he said that he had the headache for the past several days. Somewhat like his migraines but did involve double vision. He does get visual changes with his migraines but normally doesn't have double vision.  Workup to include head CT without any acute findings. Also CT of abdomen without any acute findings.  Patient's labs without significant abnormalities. No evidence of anemia. Patient was treated for migraine headache with Decadron Phenergan and Benadryl and showing signs of improvement along with fluid hydration. Patient now wants something to drink and is much more awake.  Patient's wife will be coming back to pick him up. Work note provided. Patient will be treated with Imodium, Phenergan. He will return for any new or worse symptoms to include red blood in his vomit or bowel movements.  Final Clinical Impressions(s) / ED Diagnoses   Final diagnoses:  Gastroenteritis  Seizures (HCC)  Other migraine with status migrainosus, intractable    New Prescriptions New Prescriptions   LOPERAMIDE (IMODIUM A-D) 2 MG TABLET    Take 1 tablet (2 mg total) by mouth 4 (four) times daily as needed for diarrhea or loose stools.   PROMETHAZINE (PHENERGAN) 25 MG TABLET    Take 1 tablet (25 mg total) by mouth every 6 (six) hours as needed for nausea or vomiting.     Vanetta Mulders, MD 05/04/17 [phone]

## 2017-06-04 ENCOUNTER — Emergency Department (HOSPITAL_BASED_OUTPATIENT_CLINIC_OR_DEPARTMENT_OTHER)
Admission: EM | Admit: 2017-06-04 | Discharge: 2017-06-04 | Disposition: A | Discharge status: Home / Self Care | Payer: Medicare Other | Attending: Emergency Medicine | Admitting: Emergency Medicine

## 2017-06-04 ENCOUNTER — Encounter (HOSPITAL_BASED_OUTPATIENT_CLINIC_OR_DEPARTMENT_OTHER): Payer: Self-pay | Admitting: *Deleted

## 2017-06-04 DIAGNOSIS — Z7984 Long term (current) use of oral hypoglycemic drugs: Secondary | ICD-10-CM | POA: Diagnosis not present

## 2017-06-04 DIAGNOSIS — Z794 Long term (current) use of insulin: Secondary | ICD-10-CM | POA: Insufficient documentation

## 2017-06-04 DIAGNOSIS — E119 Type 2 diabetes mellitus without complications: Secondary | ICD-10-CM | POA: Diagnosis not present

## 2017-06-04 DIAGNOSIS — Z79899 Other long term (current) drug therapy: Secondary | ICD-10-CM | POA: Diagnosis not present

## 2017-06-04 DIAGNOSIS — J45909 Unspecified asthma, uncomplicated: Secondary | ICD-10-CM | POA: Diagnosis not present

## 2017-06-04 DIAGNOSIS — I10 Essential (primary) hypertension: Secondary | ICD-10-CM | POA: Insufficient documentation

## 2017-06-04 DIAGNOSIS — Z87891 Personal history of nicotine dependence: Secondary | ICD-10-CM | POA: Diagnosis not present

## 2017-06-04 DIAGNOSIS — J029 Acute pharyngitis, unspecified: Secondary | ICD-10-CM | POA: Insufficient documentation

## 2017-06-04 LAB — RAPID STREP SCREEN (MED CTR MEBANE ONLY): Streptococcus, Group A Screen (Direct): NEGATIVE

## 2017-06-04 NOTE — ED Triage Notes (Signed)
Sore throat and unable to swallow x 2 days. Worse this am.

## 2017-06-04 NOTE — ED Provider Notes (Signed)
[facility] Provider Note   CSN: [id number] Arrival date & time: 06/04/17  1801  By signing my name below, I, Nicholas [name], attest that this documentation has been prepared under the direction and in the presence of non-physician practitioner, Russo, [place], PA-C. Electronically Signed: Rosana [name], ED Scribe. 06/04/17. 8:02 PM.  History   Chief Complaint Chief Complaint  Patient presents with  . Sore Throat   The history is provided by the patient. No language interpreter was used.   HPI Comments: Nicholas [name] is a 56 y.o. male with a PMHx of DM and asthma, who presents to the Emergency Department complaining of moderate, persistent sore throat onset 2 days ago. Pt describes pain as a burning sensation in his throat. Pt states pain is exacerbated by swallowing and coughing but pt he is able to swallow. Pt reports associated non-productive cough, congestion, subjective fever and chills. No treatments tried prior to arrival. No contact with any similar URI-like symptoms. Pt was seen in the ED 6 days ago for seizures and returned to living in his group home. Pt denies difficulty swallowing, difficulty breathing, SOB or any other complaints at this time.  Past Medical History:  Diagnosis Date  . Asthma   . Bipolar 1 disorder (HCC)   . Borderline glaucoma   . Epididymal pain    LEFT  . 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 Feeling of incomplete bladder emptying   . Frequency of urination   . Gastric ulcer   . GERD (gastroesophageal reflux disease)   . Hypertension   . Hyperthyroidism    NO MEDS   . Migraine   . Seizures (HCC)   . Type 2 diabetes mellitus [facility])     Patient Active Problem List   Diagnosis Date Noted  . Testicular/scrotal pain 11/09/2013  . Microhematuria 11/09/2013  . Condyloma acuminatum of scrotum 11/09/2013    Past Surgical History:    Procedure Laterality Date  . ANTERIOR CERVICAL DECOMP/DISCECTOMY FUSION  2007   C4  --  C6  . CERVICAL FUSION    . CHOLECYSTECTOMY    . CYSTOSCOPY N/A 11/09/2013   Procedure: CYSTOSCOPY FLEXIBLE;  Surgeon: Bjorn Pippin, MD;  Location: [facility];  Service: Urology;  Laterality: N/A;  . EPIDIDYMECTOMY Left 11/09/2013   Procedure:  LEFT EPIDIDYMECTOMY;  Surgeon: Bjorn Pippin, MD;  Location[facility];  Service: Urology;  Laterality: Left;  POSSIBLE OUTPATIENT WITH OBSERVATION  . EXCISION LIPOMA LEFT SHOULDER  2004 (APPROX)  . MULTIPLE CYST REMOVED FROM CHEST  AGE 16  . OTHER SURGICAL HISTORY     hemorroid surgery   . TESTICLE REMOVAL Left        Home Medications    Prior to Admission medications   Medication Sig Start Date End Date Taking? Authorizing Provider  amitriptyline (ELAVIL) 25 MG tablet Take 25 mg by mouth at bedtime.   Yes [provider]  atorvastatin (LIPITOR) 80 MG tablet Take 80 mg by mouth daily.   Yes [provider]  doxycycline (VIBRAMYCIN) 100 MG capsule Take 1 capsule (100 mg total) by mouth 2 (two) times daily. 04/14/17  Yes Linwood Dibbles, MD  fluticasone-salmeterol (ADVAIR HFA) [phone] MCG/ACT inhaler Inhale 2 puffs into the lungs daily.   Yes [provider]  HYDROcodone-acetaminophen (NORCO/VICODIN) 5-325 MG per tablet Take 1-2 tablets by mouth  every 6 hours as needed for pain and/or cough. 08/29/15  Yes [name], [name], PA-C  insulin glargine (LANTUS) 100 UNIT/ML injection Inject 35 Units into the skin at bedtime.   Yes [provider]  insulin lispro (HUMALOG) 100 UNIT/ML injection Inject into the skin 3 (three) times daily before meals.   Yes [provider]  levETIRAcetam (KEPPRA) 750 MG tablet Take 1 tablet (750 mg total) by mouth 2 (two) times daily. 09/13/16  Yes Lawyer, Cristal Deerhristopher, PA-C  loperamide (IMODIUM A-D) 2 MG tablet Take 1 tablet (2 mg total) by mouth 4 (four) times daily as  needed for diarrhea or loose stools. 05/04/17  Yes Vanetta MuldersZackowski, Scott, MD  losartan (COZAAR) 50 MG tablet Take 50 mg by mouth daily.   Yes [provider]  metFORMIN (GLUCOPHAGE) 500 MG tablet Take 500 mg by mouth 2 (two) times daily with a meal.   Yes [provider]  omeprazole (PRILOSEC) 20 MG capsule Take 1 capsule (20 mg total) by mouth 2 (two) times daily. 11/08/14  Yes Delo, Riley Lamouglas, MD  oxyCODONE-acetaminophen (PERCOCET/ROXICET) 5-325 MG tablet Take 1 tablet by mouth every 6 (six) hours as needed for severe pain. 11/28/16  Yes Law, Waylan BogaAlexandra M, PA-C  promethazine (PHENERGAN) 25 MG tablet Take 1 tablet (25 mg total) by mouth every 6 (six) hours as needed for nausea or vomiting. 05/04/17  Yes Vanetta MuldersZackowski, Scott, MD  rizatriptan (MAXALT) 10 MG tablet Take 10 mg by mouth as needed for migraine. May repeat in 2 hours if needed   Yes [provider]  sucralfate (CARAFATE) 1 GM/10ML suspension Take 10 mLs (1 g total) by mouth 4 (four) times daily -  with meals and at bedtime. 08/29/15  Yes [name], [name], PA-C  ciprofloxacin (CIPRO) 500 MG tablet Take 1 tablet (500 mg total) by mouth 2 (two) times daily. 11/28/16   Law, Waylan BogaAlexandra M, PA-C  metroNIDAZOLE (FLAGYL) 500 MG tablet Take 1 tablet (500 mg total) by mouth 2 (two) times daily. 11/28/16   [name], [name], PA-C  oseltamivir (TAMIFLU) 75 MG capsule Take 1 capsule (75 mg total) by mouth every 12 (twelve) hours. 02/14/17   [name], [name], MD    Family History No family history on file.  Social History Social History  Substance Use Topics  . Smoking status: Former Smoker    Packs/day: 0.25    Years: 15.00    Types: Cigarettes    Quit date: 11/08/1992  . Smokeless tobacco: Never Used  . Alcohol use No     Allergies   Nsaids; Tramadol; Amoxicillin; Ampicillin; Dilantin [phenytoin]; Penicillins; Risperidone and related; Strawberry extract; Bactrim [sulfamethoxazole-trimethoprim]; and Oatmeal   Review of  Systems Review of Systems  Constitutional: Positive for chills and fever.  HENT: Positive for congestion and sore throat. Negative for ear pain, trouble swallowing and voice change.   Respiratory: Positive for cough. Negative for shortness of breath.   Cardiovascular: Negative for chest pain.     Physical Exam Updated Vital Signs BP 129/80   Pulse (!) 106   Temp 98.9 F (37.2 C) (Oral)   Resp 20   Ht 5\' 7"  (1.702 m)   Wt 96.6 kg (213 lb)   SpO2 96%   BMI 33.36 kg/m   Physical Exam  Constitutional: He appears well-developed and well-nourished. No distress.  HENT:  Head: Normocephalic and atraumatic.  Right Ear: Tympanic membrane, external ear and ear canal normal.  Left Ear: Tympanic membrane, external ear and ear canal normal.  Nose: Nose normal.  Mouth/Throat: Uvula is midline. No trismus in the jaw. No uvula swelling. Posterior oropharyngeal erythema (mild) present. No oropharyngeal exudate. No tonsillar exudate.  Eyes: Conjunctivae and EOM are normal.  Neck: Normal range of motion. Neck supple.  Cardiovascular: Normal rate, regular rhythm and normal heart sounds.  Exam reveals no gallop and no friction rub.   No murmur heard. Pulmonary/Chest: Effort normal and breath sounds normal. No respiratory distress. He has no wheezes. He has no rales.  Lymphadenopathy:    He has no cervical adenopathy.  Psychiatric: He has a normal mood and affect. His behavior is normal.  Nursing note and vitals reviewed.    ED Treatments / Results  DIAGNOSTIC STUDIES: Oxygen Saturation is 96% on RA, normal by my interpretation.   COORDINATION OF CARE: 7:26 PM-Discussed next steps with pt including eating softer foods and pain management with Tylenol. Pt verbalized understanding and is agreeable with the plan.   Labs (all labs ordered are listed, but only abnormal results are displayed) Labs Reviewed  RAPID STREP SCREEN (NOT AT [facility])  CULTURE, GROUP A STREP [facility])    EKG  EKG  Interpretation None       Radiology No results found.  Procedures Procedures (including critical care time)  Medications Ordered in ED Medications - No data to display   Initial Impression / Assessment and Plan / ED Course  I have reviewed the triage vital signs and the nursing notes.  Pertinent labs & imaging results that were available during my care of the patient were reviewed by me and considered in my medical decision making (see chart for details).     Pt symptoms consistent with viral pharyngitis. Strep negative. No trismus, uvula is midline. Lungs CTAB. Pt is well-appearing. Pt will be discharged with symptomatic treatment. Discussed return precautions.  Pt is hemodynamically stable & in NAD prior to discharge.  Discussed results, findings, treatment and follow up. Patient advised of return precautions. Patient verbalized understanding and agreed with plan.  Final Clinical Impressions(s) / ED Diagnoses   Final diagnoses:  Viral pharyngitis    New Prescriptions Discharge Medication List as of 06/04/2017  7:49 PM     I personally performed the services described in this documentation, which was scribed in my presence. The recorded information has been reviewed and is accurate.    Russo, [place] N, PA-C 06/04/17 Bernell List, MD 06/05/17 [phone]

## 2017-06-04 NOTE — Discharge Instructions (Signed)
Please read instructions below.  You can take tylenol every 6 hours as needed for sore throat. Drink plenty of water.  Follow up with your primary care provider as needed.  Return to the ER for difficulty swallowing liquids, difficulty breathing, or new or worsening symptoms.

## 2017-06-06 ENCOUNTER — Emergency Department (HOSPITAL_BASED_OUTPATIENT_CLINIC_OR_DEPARTMENT_OTHER): Payer: Medicare Other

## 2017-06-06 ENCOUNTER — Encounter (HOSPITAL_BASED_OUTPATIENT_CLINIC_OR_DEPARTMENT_OTHER): Payer: Self-pay | Admitting: Emergency Medicine

## 2017-06-06 ENCOUNTER — Emergency Department (HOSPITAL_BASED_OUTPATIENT_CLINIC_OR_DEPARTMENT_OTHER)
Admission: EM | Admit: 2017-06-06 | Discharge: 2017-06-06 | Disposition: A | Discharge status: Home / Self Care | Payer: Medicare Other | Attending: Emergency Medicine | Admitting: Emergency Medicine

## 2017-06-06 DIAGNOSIS — Z7902 Long term (current) use of antithrombotics/antiplatelets: Secondary | ICD-10-CM | POA: Insufficient documentation

## 2017-06-06 DIAGNOSIS — J45909 Unspecified asthma, uncomplicated: Secondary | ICD-10-CM | POA: Diagnosis not present

## 2017-06-06 DIAGNOSIS — Z87891 Personal history of nicotine dependence: Secondary | ICD-10-CM | POA: Diagnosis not present

## 2017-06-06 DIAGNOSIS — E119 Type 2 diabetes mellitus without complications: Secondary | ICD-10-CM | POA: Diagnosis not present

## 2017-06-06 DIAGNOSIS — Z79899 Other long term (current) drug therapy: Secondary | ICD-10-CM | POA: Insufficient documentation

## 2017-06-06 DIAGNOSIS — Z7984 Long term (current) use of oral hypoglycemic drugs: Secondary | ICD-10-CM | POA: Insufficient documentation

## 2017-06-06 DIAGNOSIS — H539 Unspecified visual disturbance: Secondary | ICD-10-CM | POA: Diagnosis not present

## 2017-06-06 DIAGNOSIS — R569 Unspecified convulsions: Secondary | ICD-10-CM | POA: Diagnosis present

## 2017-06-06 DIAGNOSIS — J069 Acute upper respiratory infection, unspecified: Secondary | ICD-10-CM | POA: Diagnosis not present

## 2017-06-06 DIAGNOSIS — G40409 Other generalized epilepsy and epileptic syndromes, not intractable, without status epilepticus: Secondary | ICD-10-CM | POA: Diagnosis not present

## 2017-06-06 LAB — URINALYSIS, ROUTINE W REFLEX MICROSCOPIC
Bilirubin Urine: NEGATIVE
Glucose, UA: >=500 mg/dL — AB
Hgb urine dipstick: NEGATIVE
Ketones, ur: NEGATIVE mg/dL
Leukocytes, UA: NEGATIVE
Nitrite: NEGATIVE
Protein, ur: NEGATIVE mg/dL
Specific Gravity, Urine: 1.019 (ref 1.005–1.030)
pH: 5.5 (ref 5.0–8.0)

## 2017-06-06 LAB — COMPREHENSIVE METABOLIC PANEL
ALT: 75 U/L — ABNORMAL HIGH (ref 17–63)
AST: 42 U/L — ABNORMAL HIGH (ref 15–41)
Albumin: 4.1 g/dL (ref 3.5–5.0)
Alkaline Phosphatase: 52 U/L (ref 38–126)
Anion gap: 9 (ref 5–15)
BUN: 20 mg/dL (ref 6–20)
CO2: 25 mmol/L (ref 22–32)
Calcium: 8.5 mg/dL — ABNORMAL LOW (ref 8.9–10.3)
Chloride: 103 mmol/L (ref 101–111)
Creatinine, Ser: 1.48 mg/dL — ABNORMAL HIGH (ref 0.61–1.24)
GFR calc Af Amer: 60 mL/min — ABNORMAL LOW (ref >60)
GFR calc non Af Amer: 52 mL/min — ABNORMAL LOW (ref >60)
Glucose, Bld: 299 mg/dL — ABNORMAL HIGH (ref 65–99)
Potassium: 3.9 mmol/L (ref 3.5–5.1)
Sodium: 137 mmol/L (ref 135–145)
Total Bilirubin: 0.4 mg/dL (ref 0.3–1.2)
Total Protein: 7 g/dL (ref 6.5–8.1)

## 2017-06-06 LAB — CBC WITH DIFFERENTIAL/PLATELET
Basophils Absolute: 0 10*3/uL (ref 0.0–0.1)
Basophils Relative: 0 %
Eosinophils Absolute: 0.1 10*3/uL (ref 0.0–0.7)
Eosinophils Relative: 2 %
HCT: 37.8 % — ABNORMAL LOW (ref 39.0–52.0)
Hemoglobin: 12.7 g/dL — ABNORMAL LOW (ref 13.0–17.0)
Lymphocytes Relative: 29 %
Lymphs Abs: 1.6 10*3/uL (ref 0.7–4.0)
MCH: 28.3 pg (ref 26.0–34.0)
MCHC: 33.6 g/dL (ref 30.0–36.0)
MCV: 84.2 fL (ref 78.0–100.0)
Monocytes Absolute: 0.4 10*3/uL (ref 0.1–1.0)
Monocytes Relative: 8 %
Neutro Abs: 3.2 10*3/uL (ref 1.7–7.7)
Neutrophils Relative %: 61 %
Platelets: 145 10*3/uL — ABNORMAL LOW (ref 150–400)
RBC: 4.49 MIL/uL (ref 4.22–5.81)
RDW: 14.9 % (ref 11.5–15.5)
WBC: 5.3 10*3/uL (ref 4.0–10.5)

## 2017-06-06 LAB — RAPID STREP SCREEN (MED CTR MEBANE ONLY): Streptococcus, Group A Screen (Direct): NEGATIVE

## 2017-06-06 LAB — CBG MONITORING, ED: Glucose-Capillary: 336 mg/dL — ABNORMAL HIGH (ref 65–99)

## 2017-06-06 LAB — PROTIME-INR
INR: 1.09
Prothrombin Time: 14.1 seconds (ref 11.4–15.2)

## 2017-06-06 LAB — URINALYSIS, MICROSCOPIC (REFLEX): RBC / HPF: NONE SEEN RBC/hpf (ref 0–5)

## 2017-06-06 MED ORDER — PROCHLORPERAZINE EDISYLATE 5 MG/ML IJ SOLN
10.0000 mg | Freq: Once | INTRAMUSCULAR | Status: AC
  Administered 2017-06-06: 10 mg via INTRAVENOUS
  Filled 2017-06-06: qty 2

## 2017-06-06 MED ORDER — SODIUM CHLORIDE 0.9 % IV BOLUS (SEPSIS)
1000.0000 mL | Freq: Once | INTRAVENOUS | Status: AC
  Administered 2017-06-06: 1000 mL via INTRAVENOUS

## 2017-06-06 MED ORDER — DEXAMETHASONE SODIUM PHOSPHATE 10 MG/ML IJ SOLN
10.0000 mg | Freq: Once | INTRAMUSCULAR | Status: AC
  Administered 2017-06-06: 10 mg via INTRAVENOUS
  Filled 2017-06-06: qty 1

## 2017-06-06 MED ORDER — DIPHENHYDRAMINE HCL 50 MG/ML IJ SOLN
25.0000 mg | Freq: Once | INTRAMUSCULAR | Status: AC
  Administered 2017-06-06: 25 mg via INTRAVENOUS
  Filled 2017-06-06: qty 1

## 2017-06-06 NOTE — ED Notes (Signed)
Pt's wife called out -- found pt seizing (approx 1 min); pt turned to L side. MD notified.

## 2017-06-06 NOTE — ED Triage Notes (Signed)
Post ictal on arrival upon arrival to ED, was coming to ED for eval of high BS and had a clonic tonic sz x2 .

## 2017-06-06 NOTE — ED Notes (Signed)
Family at bedside. 

## 2017-06-06 NOTE — ED Notes (Signed)
Pt sleeping, resp even/unlabored; NAD.

## 2017-06-06 NOTE — ED Notes (Signed)
Alert, behavior appropriate.ED MD at bedside

## 2017-06-06 NOTE — ED Notes (Signed)
Arrived at pt's car at ED entrance -- pt having grand mal seizure that wife states began on arrival. Cough drop visualized and removed from pt's mouth. Seizure lasted approx 2 mins. Pt lethargic but responsive to commands for approx 1 min. Second grand mal seizure began, lasting approx 35 secs. MD notified when pt was roomed to 6114.

## 2017-06-06 NOTE — ED Provider Notes (Signed)
[facility] Provider Note   CSN: [id number] Arrival date & time: 06/06/17  1013     History   Chief Complaint Chief Complaint  Patient presents with  . Seizures    HPI Nicholas [name] is a 56 y.o. male.  The history is provided by the patient, medical records, a significant other, a relative and a friend. The history is limited by the condition of the patient.  Seizures   This is a recurrent problem. The current episode started less than 1 hour ago. The problem has not changed since onset.There were 2 to 3 seizures. The most recent episode lasted 30 to 120 seconds. Associated symptoms include headaches, visual disturbances and sore throat. Pertinent negatives include no sleepiness, no confusion, no speech difficulty, no neck stiffness, no chest pain, no cough, no nausea, no vomiting, no diarrhea and no muscle weakness. Characteristics include rhythmic jerking. The episode was witnessed. The seizure(s) had no focality. Possible causes do not include medication or dosage change or change in alcohol use. Increase in caffeine use There has been no fever. There were no medications administered prior to arrival.    Past Medical History:  Diagnosis Date  . Asthma   . Bipolar 1 disorder (HCC)   . Borderline glaucoma   . Epididymal pain    LEFT  . 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 Feeling of incomplete bladder emptying   . Frequency of urination   . Gastric ulcer   . GERD (gastroesophageal reflux disease)   . Hypertension   . Hyperthyroidism    NO MEDS   . Migraine   . Seizures (HCC)   . Type 2 diabetes mellitus [facility])     Patient Active Problem List   Diagnosis Date Noted  . Testicular/scrotal pain 11/09/2013  . Microhematuria 11/09/2013  . Condyloma acuminatum of scrotum 11/09/2013    Past Surgical History:  Procedure Laterality Date  . ANTERIOR CERVICAL  DECOMP/DISCECTOMY FUSION  2007   C4  --  C6  . CERVICAL FUSION    . CHOLECYSTECTOMY    . CYSTOSCOPY N/A 11/09/2013   Procedure: CYSTOSCOPY FLEXIBLE;  Surgeon: Bjorn Pippin, MD;  Location: [facility];  Service: Urology;  Laterality: N/A;  . EPIDIDYMECTOMY Left 11/09/2013   Procedure:  LEFT EPIDIDYMECTOMY;  Surgeon: Bjorn Pippin, MD;  Location: [facility];  Service: Urology;  Laterality: Left;  POSSIBLE OUTPATIENT WITH OBSERVATION  . EXCISION LIPOMA LEFT SHOULDER  2004 (APPROX)  . MULTIPLE CYST REMOVED FROM CHEST  AGE 71  . OTHER SURGICAL HISTORY     hemorroid surgery   . TESTICLE REMOVAL Left        Home Medications    Prior to Admission medications   Medication Sig Start Date End Date Taking? Authorizing Provider  amitriptyline (ELAVIL) 25 MG tablet Take 25 mg by mouth at bedtime.    [provider]  atorvastatin (LIPITOR) 80 MG tablet Take 80 mg by mouth daily.    [provider]  ciprofloxacin (CIPRO) 500 MG tablet Take 1 tablet (500 mg total) by mouth 2 (two) times daily. 11/28/16   Emi Holes, PA-C  doxycycline (VIBRAMYCIN) 100 MG capsule Take 1 capsule (100 mg total) by mouth 2 (two) times daily. 04/14/17   Linwood Dibbles, MD  fluticasone-salmeterol (ADVAIR HFA) [phone] MCG/ACT inhaler Inhale 2 puffs into the lungs  daily.    [provider]  HYDROcodone-acetaminophen (NORCO/VICODIN) 5-325 MG per tablet Take 1-2 tablets by mouth every 6 hours as needed for pain and/or cough. 08/29/15   [name], Joni Reining, PA-C  insulin glargine (LANTUS) 100 UNIT/ML injection Inject 35 Units into the skin at bedtime.    [provider]  insulin lispro (HUMALOG) 100 UNIT/ML injection Inject into the skin 3 (three) times daily before meals.    [provider]  levETIRAcetam (KEPPRA) 750 MG tablet Take 1 tablet (750 mg total) by mouth 2 (two) times daily. 09/13/16   Lawyer, Cristal Deer, PA-C  loperamide (IMODIUM A-D) 2 MG tablet Take 1  tablet (2 mg total) by mouth 4 (four) times daily as needed for diarrhea or loose stools. 05/04/17   Vanetta Mulders, MD  losartan (COZAAR) 50 MG tablet Take 50 mg by mouth daily.    [provider]  metFORMIN (GLUCOPHAGE) 500 MG tablet Take 500 mg by mouth 2 (two) times daily with a meal.    [provider]  metroNIDAZOLE (FLAGYL) 500 MG tablet Take 1 tablet (500 mg total) by mouth 2 (two) times daily. 11/28/16   Law, [name], PA-C  omeprazole (PRILOSEC) 20 MG capsule Take 1 capsule (20 mg total) by mouth 2 (two) times daily. 11/08/14   [name], MD  oseltamivir (TAMIFLU) 75 MG capsule Take 1 capsule (75 mg total) by mouth every 12 (twelve) hours. 02/14/17   [name], [name], MD  oxyCODONE-acetaminophen (PERCOCET/ROXICET) 5-325 MG tablet Take 1 tablet by mouth every 6 (six) hours as needed for severe pain. 11/28/16   Law, [name], PA-C  promethazine (PHENERGAN) 25 MG tablet Take 1 tablet (25 mg total) by mouth every 6 (six) hours as needed for nausea or vomiting. 05/04/17   Vanetta Mulders, MD  rizatriptan (MAXALT) 10 MG tablet Take 10 mg by mouth as needed for migraine. May repeat in 2 hours if needed    [provider]  sucralfate (CARAFATE) 1 GM/10ML suspension Take 10 mLs (1 g total) by mouth 4 (four) times daily -  with meals and at bedtime. 08/29/15   [name], Mardella Layman    Family History No family history on file.  Social History Social History  Substance Use Topics  . Smoking status: Former Smoker    Packs/day: 0.25    Years: 15.00    Types: Cigarettes    Quit date: 11/08/1992  . Smokeless tobacco: Never Used  . Alcohol use No     Allergies   Nsaids; Tramadol; Amoxicillin; Ampicillin; Dilantin [phenytoin]; Penicillins; Risperidone and related; Strawberry extract; Bactrim [sulfamethoxazole-trimethoprim]; and Oatmeal   Review of Systems Review of Systems  Constitutional: Negative for chills, diaphoresis, fatigue and fever.  HENT: Positive  for congestion, rhinorrhea and sore throat.   Eyes: Positive for visual disturbance.  Respiratory: Negative for cough, chest tightness, shortness of breath and wheezing.   Cardiovascular: Negative for chest pain and palpitations.  Gastrointestinal: Negative for diarrhea, nausea and vomiting.  Genitourinary: Negative for dysuria, flank pain and frequency.  Musculoskeletal: Negative for back pain, neck pain and neck stiffness.  Skin: Negative for rash and wound.  Neurological: Positive for seizures and headaches. Negative for syncope, speech difficulty, weakness, light-headedness and numbness.  Psychiatric/Behavioral: Negative for agitation and confusion.  All other systems reviewed and are negative.    Physical Exam Updated Vital Signs Pulse 83   Temp 98.5 F (36.9 C) (Tympanic)   Resp (!) 27   SpO2 98%   Physical Exam  Constitutional:  He is oriented to person, place, and time. He appears well-developed and well-nourished. No distress.  HENT:  Head: Normocephalic and atraumatic.  Mouth/Throat: Oropharynx is clear and moist. No oropharyngeal exudate.  Eyes: Conjunctivae and EOM are normal. Pupils are equal, round, and reactive to light.  Neck: Normal range of motion. Neck supple.  Cardiovascular: Normal rate, regular rhythm and intact distal pulses.   No murmur heard. Pulmonary/Chest: Effort normal and breath sounds normal. No stridor. No respiratory distress. He has no wheezes. He exhibits no tenderness.  Abdominal: Soft. There is no tenderness.  Musculoskeletal: He exhibits no edema or tenderness.  Neurological: He is alert and oriented to person, place, and time. He displays normal reflexes. No cranial nerve deficit or sensory deficit. He exhibits normal muscle tone. Coordination normal.  Skin: Skin is warm. Capillary refill takes less than 2 seconds. He is not diaphoretic. No erythema. No pallor.  Psychiatric: He has a normal mood and affect.  Nursing note and vitals  reviewed.    ED Treatments / Results  Labs (all labs ordered are listed, but only abnormal results are displayed) Labs Reviewed  CBC WITH DIFFERENTIAL/PLATELET - Abnormal; Notable for the following:       Result Value   Hemoglobin 12.7 (*)    HCT 37.8 (*)    Platelets 145 (*)    All other components within normal limits  COMPREHENSIVE METABOLIC PANEL - Abnormal; Notable for the following:    Glucose, Bld 299 (*)    Creatinine, Ser 1.48 (*)    Calcium 8.5 (*)    AST 42 (*)    ALT 75 (*)    GFR calc non Af Amer 52 (*)    GFR calc Af Amer 60 (*)    All other components within normal limits  URINALYSIS, ROUTINE W REFLEX MICROSCOPIC - Abnormal; Notable for the following:    Glucose, UA >=500 (*)    All other components within normal limits  URINALYSIS, MICROSCOPIC (REFLEX) - Abnormal; Notable for the following:    Bacteria, UA RARE (*)    Squamous Epithelial / LPF 0-5 (*)    All other components within normal limits  CBG MONITORING, ED - Abnormal; Notable for the following:    Glucose-Capillary 336 (*)    All other components within normal limits  RAPID STREP SCREEN (NOT AT [facility])  URINE CULTURE  CULTURE, GROUP A STREP [facility])  PROTIME-INR    EKG  EKG Interpretation  Date/Time:  Sunday June 06 2017 10:19:17 EDT Ventricular Rate:  84 PR Interval:    QRS Duration: 88 QT Interval:  353 QTC Calculation: 418 R Axis:   -20 Text Interpretation:  Sinus rhythm Borderline left axis deviation When compared to prior, no significant changes seen.  no STEMI Confirmed by Theda Belfast ([id number]) on 06/06/2017 11:42:53 AM       Radiology Dg Chest 2 View  Result Date: 06/06/2017 CLINICAL DATA:  Diplopia EXAM: CHEST  2 VIEW COMPARISON:  03/10/2017 FINDINGS: Low lung volumes. Cardiomegaly with vascular congestion and bibasilar atelectasis. No effusions or acute bony abnormality. IMPRESSION: Low lung volumes with bibasilar atelectasis. Cardiomegaly, vascular congestion. Electronically Signed    By: Charlett Nose M.D.   On: 06/06/2017 10:59   Ct Head Wo Contrast  Result Date: 06/06/2017 CLINICAL DATA:  Diplopia EXAM: CT HEAD WITHOUT CONTRAST TECHNIQUE: Contiguous axial images were obtained from the base of the skull through the vertex without intravenous contrast. COMPARISON:  05/29/2017 FINDINGS: Brain: No acute intracranial abnormality. Specifically, no hemorrhage,  hydrocephalus, mass lesion, acute infarction, or significant intracranial injury. Vascular: No hyperdense vessel or unexpected calcification. Skull: No acute calvarial abnormality. Sinuses/Orbits: Visualized paranasal sinuses and mastoids clear. Orbital soft tissues unremarkable. Other: None IMPRESSION: Normal study. Electronically Signed   By: Charlett NoseKevin  Dover M.D.   On: 06/06/2017 10:51    Procedures Procedures (including critical care time)  Medications Ordered in ED Medications  sodium chloride 0.9 % bolus 1,000 mL (0 mLs Intravenous Stopped 06/06/17 1358)  prochlorperazine (COMPAZINE) injection 10 mg (10 mg Intravenous Given 06/06/17 1251)  diphenhydrAMINE (BENADRYL) injection 25 mg (25 mg Intravenous Given 06/06/17 1254)  dexamethasone (DECADRON) injection 10 mg (10 mg Intravenous Given 06/06/17 1256)     Initial Impression / Assessment and Plan / ED Course  I have reviewed the triage vital signs and the nursing notes.  Pertinent labs & imaging results that were available during my care of the patient were reviewed by me and considered in my medical decision making (see chart for details).     Valla Leaverdward W Weiler is a 56 y.o. male with a past medical history significant for hypertension, migraines, bipolar disorder, diabetes, and seizures on Keppra who presents with recurrent seizures, headache, and double vision. Patient is accompanied by family who reports that patient had a seizure at church today prompting him to get evaluated. Patient reportedly had to 1 minute long seizures at church before coming to the ED. Patient had one  32nd seizure upon arrival to the ED in triage. After waking up, patient says that he has had headaches for the last 4 or 5 days with associated double vision. He has had this in the past  According to chart review. Patient says that he takes Keppra for seizure prevention but during a past admission was given a dose of Depakote. He is concerned that this is causing his symptoms. Patient is no longer on the Depakote. Patient says that he has had cold-like symptoms with runny nose, congestion, and cough with sore throat for the last few days. Patient was diagnosed with a viral pharyngitis several days ago at this facility. Patient says the symptoms are continued.  Patient denies any other symptoms or injuries.  On exam, patient follows all commands. Patient has symmetric grip strength and leg strength. Normal sensation in extremities and face. No facial droop. Patient reports diplopia but otherwise has normal extraocular movements on my exam. No tenderness in the chest. Lungs clear. Abdomen nontender. Normal pulses in all extremities.  Patient will have CT of the head as well as laboratory testing to determine etiology for symptoms. Suspect, daily migraine contributing to his headaches and breakthrough seizures. Patient will have strep swab and lab testing to look for infection.    10:40 AM Patient's wife arrived and patient has been drinking "lots of those energy drinks". Suspect increasing caffeine may have contributed to seizure-like spells.  Diagnostic testing results in above. Strep swab was negative. CT of the head shows no acute abnormalities. No pneumonia on chest x-ray. EKG was Similar to prior.  Urinalysis shows no UTI. CBC shows no leukocytosis and similar anemia to prior. INR nonelevated, CMP showed elevation of creatinine at 1.48. This is similar to a reading in the last few months.  Patient was observed for a period time with no further seizures. Suspect combination of recent URI and the  wife's reported increase in energy drink with caffeine use. Patient also reportedly drinks "lots" of coffee. Suspect this may lower seizure threshold.  Patient given headache cocktail  with resolution of headache and resolution of diplopia. This appears consistent with prior treatment of his headaches. Patient feels much better on reassessment.  Do not feel patient needs further workup or admission at this time. Do not feel patient has encephalitis or meningitis. Patient is at his mental status baseline and appears well. Patient will continue taking his Keppra at home and follow-up with his PCP and neurologist. Patient voiced understanding of the plan of care as well as return precautions. Patient and family had no other questions or concerns and patient was discharged in good condition.   Final Clinical Impressions(s) / ED Diagnoses   Final diagnoses:  Viral upper respiratory tract infection  Seizure [facility])    New Prescriptions Discharge Medication List as of 06/06/2017  3:46 PM      Clinical Impression: 1. Viral upper respiratory tract infection   2. Seizure [facility])     Disposition: Discharge  Condition: Good  I have discussed the results, Dx and Tx plan with the pt(& family if present). He/she/they expressed understanding and agree(s) with the plan. Discharge instructions discussed at great length. Strict return precautions discussed and pt &/or family have verbalized understanding of the instructions. No further questions at time of discharge.    Discharge Medication List as of 06/06/2017  3:46 PM      Follow Up: [facility] [address] [place] [address] [phone] Schedule an appointment as soon as possible for a visit    [facility] [place] EM[facility]GENCY DEPARTMENT [address] [phone] mc [address] [place] [address] [phone]  If symptoms worsen     Shae Hinnenkamp, Canary Brim, MD 06/06/17  1810

## 2017-06-06 NOTE — ED Notes (Signed)
Pt remains on cardiac monitor and auto VS 

## 2017-06-06 NOTE — ED Notes (Signed)
Seizure pads in place on bed. Pt on cardiac monitor and auto VS

## 2017-06-06 NOTE — ED Notes (Signed)
[facility]

## 2017-06-06 NOTE — ED Notes (Signed)
Pt on cardiac monitor and auto VS 

## 2017-06-06 NOTE — ED Notes (Signed)
Pt and wife given d/c instructions as per chart. Verbalize understanding. No questions. 

## 2017-06-06 NOTE — ED Notes (Signed)
Patient transported to CT 

## 2017-06-06 NOTE — ED Notes (Signed)
Pt and wife informed of the need for a urine sample. Pt unable to void at this time. Urinal given and instructed to push call bell.

## 2017-06-06 NOTE — Discharge Instructions (Signed)
Please follow-up with your neurologist and your primary care physician for further management of your seizures. Please avoid caffeine and get lots of sleep as this can help prevent seizures. Please continue taking your outpatient seizure medication of Keppra. We feel he also had a viral upper respiratory infection that you likely received from sick contacts. Please stay hydrated. If any symptoms change or worsen, please return to the nearest emergency department.

## 2017-06-07 LAB — CULTURE, GROUP A STREP (THRC)

## 2017-06-08 ENCOUNTER — Emergency Department (HOSPITAL_BASED_OUTPATIENT_CLINIC_OR_DEPARTMENT_OTHER): Payer: Medicare Other

## 2017-06-08 ENCOUNTER — Encounter (HOSPITAL_BASED_OUTPATIENT_CLINIC_OR_DEPARTMENT_OTHER): Payer: Self-pay | Admitting: Emergency Medicine

## 2017-06-08 ENCOUNTER — Emergency Department (HOSPITAL_BASED_OUTPATIENT_CLINIC_OR_DEPARTMENT_OTHER)
Admission: EM | Admit: 2017-06-08 | Discharge: 2017-06-09 | Disposition: A | Discharge status: Home / Self Care | Payer: Medicare Other | Attending: Emergency Medicine | Admitting: Emergency Medicine

## 2017-06-08 DIAGNOSIS — J029 Acute pharyngitis, unspecified: Secondary | ICD-10-CM | POA: Insufficient documentation

## 2017-06-08 DIAGNOSIS — Z794 Long term (current) use of insulin: Secondary | ICD-10-CM | POA: Insufficient documentation

## 2017-06-08 DIAGNOSIS — I10 Essential (primary) hypertension: Secondary | ICD-10-CM | POA: Insufficient documentation

## 2017-06-08 DIAGNOSIS — Z79899 Other long term (current) drug therapy: Secondary | ICD-10-CM | POA: Insufficient documentation

## 2017-06-08 DIAGNOSIS — J45909 Unspecified asthma, uncomplicated: Secondary | ICD-10-CM | POA: Insufficient documentation

## 2017-06-08 DIAGNOSIS — E119 Type 2 diabetes mellitus without complications: Secondary | ICD-10-CM | POA: Insufficient documentation

## 2017-06-08 DIAGNOSIS — Z87891 Personal history of nicotine dependence: Secondary | ICD-10-CM | POA: Diagnosis not present

## 2017-06-08 LAB — URINE CULTURE: Culture: NO GROWTH

## 2017-06-08 MED ORDER — DEXAMETHASONE SODIUM PHOSPHATE 10 MG/ML IJ SOLN
10.0000 mg | Freq: Once | INTRAMUSCULAR | Status: AC
  Administered 2017-06-08: 10 mg via INTRAMUSCULAR
  Filled 2017-06-08: qty 1

## 2017-06-08 NOTE — ED Triage Notes (Signed)
Seen x 3 days ago for seizure and sorethroat  States all test were neg .   States continues to have sorethroat   Difficulty swallowing

## 2017-06-08 NOTE — ED Provider Notes (Addendum)
[facility] Provider Note   CSN: [id number] Arrival date & time: 06/08/17  2139  By signing my name below, I, Rosario AdieWilliam Andrew Hiatt, attest that this documentation has been prepared under the direction and in the presence of Betha Shadix, MD. Electronically Signed: Rosario AdieWilliam Andrew Hiatt, ED Scribe. 06/08/17. 11:28 PM.  History   Chief Complaint Chief Complaint  Patient presents with  . Sore Throat    recheck   The history is provided by the patient and medical records. No language interpreter was used.  Sore Throat  This is a new problem. The current episode started more than 1 week ago. The problem occurs constantly. The problem has not changed since onset.Pertinent negatives include no chest pain, no abdominal pain, no headaches and no shortness of breath. The symptoms are aggravated by swallowing. Nothing relieves the symptoms. He has tried acetaminophen for the symptoms. The treatment provided no relief.    HPI Comments: Nicholas [name] is a 56 y.o. male with a PMHx of DM, who presents to the Emergency Department complaining of persistent, worsening sore throat beginning more than one week ago. Pt was seen in the ED for same one week ago and again three days ago; rapid strep both times was negative. This was sent for culture which was also negative. He has been using saltwater gargles and taking Tylenol at home w/o relief and his symptoms have worsened despite this. Pt is able to tolerate their own secretions, but pain is exacerbated w/ swallowing. He denies drooling, fever, or any other associated symptoms.   Past Medical History:  Diagnosis Date  . Asthma   . Bipolar 1 disorder (HCC)   . Borderline glaucoma   . Epididymal pain    LEFT  . 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 Feeling of incomplete bladder emptying   . Frequency of urination   . Gastric ulcer   . GERD  (gastroesophageal reflux disease)   . Hypertension   . Hyperthyroidism    NO MEDS   . Migraine   . Seizures (HCC)   . Type 2 diabetes mellitus [facility](HCC)     Patient Active Problem List   Diagnosis Date Noted  . Testicular/scrotal pain 11/09/2013  . Microhematuria 11/09/2013  . Condyloma acuminatum of scrotum 11/09/2013    Past Surgical History:  Procedure Laterality Date  . ANTERIOR CERVICAL DECOMP/DISCECTOMY FUSION  2007   C4  --  C6  . CERVICAL FUSION    . CHOLECYSTECTOMY    . CYSTOSCOPY N/A 11/09/2013   Procedure: CYSTOSCOPY FLEXIBLE;  Surgeon: Bjorn PippinJohn Wrenn, MD;  [facility];  Service: Urology;  Laterality: N/A;  . EPIDIDYMECTOMY Left 11/09/2013   Procedure:  LEFT EPIDIDYMECTOMY;  Surgeon: Bjorn PippinJohn Wrenn, MD;  [facility];  Service: Urology;  Laterality: Left;  POSSIBLE OUTPATIENT WITH OBSERVATION  . EXCISION LIPOMA LEFT SHOULDER  2004 (APPROX)  . MULTIPLE CYST REMOVED FROM CHEST  AGE 16  . OTHER SURGICAL HISTORY     hemorroid surgery   . TESTICLE REMOVAL Left        Home Medications    Prior to Admission medications   Medication Sig Start Date End Date Taking? Authorizing Provider  amitriptyline (ELAVIL) 25 MG tablet Take 25 mg by mouth at bedtime.    [provider]  atorvastatin (LIPITOR) 80 MG tablet Take 80  mg by mouth daily.    [provider]  ciprofloxacin (CIPRO) 500 MG tablet Take 1 tablet (500 mg total) by mouth 2 (two) times daily. 11/28/16   Emi Holes, PA-C  doxycycline (VIBRAMYCIN) 100 MG capsule Take 1 capsule (100 mg total) by mouth 2 (two) times daily. 04/14/17   Linwood Dibbles, MD  fluticasone-salmeterol (ADVAIR HFA) [phone] MCG/ACT inhaler Inhale 2 puffs into the lungs daily.    [provider]  gabapentin (NEURONTIN) 300 MG capsule Take 300 mg by mouth 3 (three) times daily.    [provider]  HYDROcodone-acetaminophen (NORCO/VICODIN) 5-325 MG per tablet Take 1-2 tablets by  mouth every 6 hours as needed for pain and/or cough. 08/29/15   [name], Joni Reining, PA-C  insulin glargine (LANTUS) 100 UNIT/ML injection Inject 35 Units into the skin at bedtime.    [provider]  insulin lispro (HUMALOG) 100 UNIT/ML injection Inject into the skin 3 (three) times daily before meals.    [provider]  levETIRAcetam (KEPPRA) 750 MG tablet Take 1 tablet (750 mg total) by mouth 2 (two) times daily. Patient taking differently: Take 250 mg by mouth 2 (two) times daily.  09/13/16   Lawyer, Cristal Deer, PA-C  loperamide (IMODIUM A-D) 2 MG tablet Take 1 tablet (2 mg total) by mouth 4 (four) times daily as needed for diarrhea or loose stools. 05/04/17   Vanetta Mulders, MD  losartan (COZAAR) 50 MG tablet Take 50 mg by mouth daily.    [provider]  metFORMIN (GLUCOPHAGE) 500 MG tablet Take 1,000 mg by mouth 2 (two) times daily with a meal.     [provider]  metroNIDAZOLE (FLAGYL) 500 MG tablet Take 1 tablet (500 mg total) by mouth 2 (two) times daily. 11/28/16   Law, [name], PA-C  omeprazole (PRILOSEC) 20 MG capsule Take 1 capsule (20 mg total) by mouth 2 (two) times daily. 11/08/14   [name], MD  oseltamivir (TAMIFLU) 75 MG capsule Take 1 capsule (75 mg total) by mouth every 12 (twelve) hours. 02/14/17   [name], [name], MD  oxyCODONE-acetaminophen (PERCOCET/ROXICET) 5-325 MG tablet Take 1 tablet by mouth every 6 (six) hours as needed for severe pain. 11/28/16   Law, [name], PA-C  promethazine (PHENERGAN) 25 MG tablet Take 1 tablet (25 mg total) by mouth every 6 (six) hours as needed for nausea or vomiting. 05/04/17   Vanetta Mulders, MD  rizatriptan (MAXALT) 10 MG tablet Take 10 mg by mouth as needed for migraine. May repeat in 2 hours if needed    [provider]  sucralfate (CARAFATE) 1 GM/10ML suspension Take 10 mLs (1 g total) by mouth 4 (four) times daily -  with meals and at bedtime. 08/29/15   [name], Mardella Layman     Family History No family history on file.  Social History Social History  Substance Use Topics  . Smoking status: Former Smoker    Packs/day: 0.25    Years: 15.00    Types: Cigarettes    Quit date: 11/08/1992  . Smokeless tobacco: Never Used  . Alcohol use No     Allergies   Nsaids; Tramadol; Amoxicillin; Ampicillin; Dilantin [phenytoin]; Penicillins; Risperidone and related; Strawberry extract; Bactrim [sulfamethoxazole-trimethoprim]; Depakote [divalproex sodium]; and Oatmeal   Review of Systems Review of Systems  Constitutional: Negative for appetite change, chills, diaphoresis, fatigue and fever.  HENT: Positive for sore throat. Negative for drooling, facial swelling, trouble swallowing and voice change.   Eyes: Negative for photophobia.  Respiratory:  Negative for shortness of breath.   Cardiovascular: Negative for chest pain, palpitations and leg swelling.  Gastrointestinal: Negative for abdominal pain and anal bleeding.  Genitourinary: Negative for difficulty urinating.  Musculoskeletal: Negative for neck stiffness.  Skin: Negative for pallor.  Neurological: Negative for facial asymmetry, speech difficulty and headaches.  Psychiatric/Behavioral: Negative for suicidal ideas.  All other systems reviewed and are negative.  Physical Exam Updated Vital Signs BP 131/88 (BP Location: Left Arm)   Pulse (!) 107   Temp 98.5 F (36.9 C) (Oral)   Ht 5\' 7"  (1.702 m)   Wt 213 lb (96.6 kg)   SpO2 97%   BMI 33.36 kg/m   Physical Exam  Constitutional: He appears well-developed and well-nourished.  HENT:  Head: Normocephalic.  Right Ear: External ear normal.  Left Ear: External ear normal.  Mouth/Throat: Uvula is midline and mucous membranes are normal. No trismus in the jaw. No uvula swelling. Posterior oropharyngeal erythema present. No oropharyngeal exudate, posterior oropharyngeal edema or tonsillar abscesses. No tonsillar exudate.  No airway swelling. Roof of mouth  unremarkable. Intact phonation. Mallempati class one no swelling of the lips tongue or uvula.  Intact phonation  Eyes: Conjunctivae and EOM are normal. Pupils are equal, round, and reactive to light. Right eye exhibits no discharge. Left eye exhibits no discharge. No scleral icterus.  Neck: Normal range of motion, full passive range of motion without pain and phonation normal. Neck supple. No JVD present. No tracheal deviation present.  Trachea is midline. No stridor or carotid bruits.  No pain with displacement of the larynx  Cardiovascular: Normal rate, regular rhythm, normal heart sounds and intact distal pulses.   No murmur heard. Pulmonary/Chest: Effort normal and breath sounds normal. No stridor. No respiratory distress. He has no wheezes. He has no rales.  Lungs CTA bilaterally.  Abdominal: Soft. Bowel sounds are normal. He exhibits no distension and no mass. There is no tenderness. There is no rebound and no guarding.  Musculoskeletal: Normal range of motion. He exhibits no edema or tenderness.  All compartments are soft. No palpable cords.   Lymphadenopathy:    He has no cervical adenopathy.       Right cervical: No superficial cervical, no deep cervical and no posterior cervical adenopathy present.      Left cervical: No superficial cervical, no deep cervical and no posterior cervical adenopathy present.  Neurological: He is alert. He has normal reflexes. He displays normal reflexes.  Skin: Skin is warm and dry. Capillary refill takes less than 2 seconds.  Psychiatric: He has a normal mood and affect. His behavior is normal.  Nursing note and vitals reviewed.  ED Treatments / Results   Results for orders placed or performed during the hospital encounter of 06/06/17  Urine culture  Result Value Ref Range   Specimen Description URINE, CLEAN CATCH    Special Requests NONE    Culture      NO GROWTH Performed at [name], [address] [place], [place] [address]     Report Status 06/08/2017 FINAL   Rapid strep screen  Result Value Ref Range   Streptococcus, Group A Screen (Direct) NEGATIVE NEGATIVE  Culture, group A strep  Result Value Ref Range   Specimen Description THROAT    Special Requests NONE Reflexed from [id number]    Culture      CULTURE REINCUBATED FOR BETTER GROWTH Performed at [facility], [address], [place], [place] [address]    Report  Status PENDING   CBC with Differential  Result Value Ref Range   WBC 5.3 4.0 - 10.5 K/uL   RBC 4.49 4.22 - 5.81 MIL/uL   Hemoglobin 12.7 (L) 13.0 - 17.0 g/dL   HCT 11.9 (L) 14.7 - 82.9 %   MCV 84.2 78.0 - 100.0 fL   MCH 28.3 26.0 - 34.0 pg   MCHC 33.6 30.0 - 36.0 g/dL   RDW 56.2 13.0 - 86.5 %   Platelets 145 (L) 150 - 400 K/uL   Neutrophils Relative % 61 %   Neutro Abs 3.2 1.7 - 7.7 K/uL   Lymphocytes Relative 29 %   Lymphs Abs 1.6 0.7 - 4.0 K/uL   Monocytes Relative 8 %   Monocytes Absolute 0.4 0.1 - 1.0 K/uL   Eosinophils Relative 2 %   Eosinophils Absolute 0.1 0.0 - 0.7 K/uL   Basophils Relative 0 %   Basophils Absolute 0.0 0.0 - 0.1 K/uL  Comprehensive metabolic panel  Result Value Ref Range   Sodium 137 135 - 145 mmol/L   Potassium 3.9 3.5 - 5.1 mmol/L   Chloride 103 101 - 111 mmol/L   CO2 25 22 - 32 mmol/L   Glucose, Bld 299 (H) 65 - 99 mg/dL   BUN 20 6 - 20 mg/dL   Creatinine, Ser 7.84 (H) 0.61 - 1.24 mg/dL   Calcium 8.5 (L) 8.9 - 10.3 mg/dL   Total Protein 7.0 6.5 - 8.1 g/dL   Albumin 4.1 3.5 - 5.0 g/dL   AST 42 (H) 15 - 41 U/L   ALT 75 (H) 17 - 63 U/L   Alkaline Phosphatase 52 38 - 126 U/L   Total Bilirubin 0.4 0.3 - 1.2 mg/dL   GFR calc non Af Amer 52 (L) >60 mL/min   GFR calc Af Amer 60 (L) >60 mL/min   Anion gap 9 5 - 15  Urinalysis, Routine w reflex microscopic  Result Value Ref Range   Color, Urine YELLOW YELLOW   APPearance CLEAR CLEAR   Specific Gravity, Urine 1.019 1.005 - 1.030   pH 5.5 5.0 - 8.0   Glucose, UA >=500 (A) NEGATIVE mg/dL   Hgb urine  dipstick NEGATIVE NEGATIVE   Bilirubin Urine NEGATIVE NEGATIVE   Ketones, ur NEGATIVE NEGATIVE mg/dL   Protein, ur NEGATIVE NEGATIVE mg/dL   Nitrite NEGATIVE NEGATIVE   Leukocytes, UA NEGATIVE NEGATIVE  Urinalysis, Microscopic (reflex)  Result Value Ref Range   RBC / HPF NONE SEEN 0 - 5 RBC/hpf   WBC, UA 0-5 0 - 5 WBC/hpf   Bacteria, UA RARE (A) NONE SEEN   Squamous Epithelial / LPF 0-5 (A) NONE SEEN  Protime-INR  Result Value Ref Range   Prothrombin Time 14.1 11.4 - 15.2 seconds   INR 1.09   CBG monitoring, ED  Result Value Ref Range   Glucose-Capillary 336 (H) 65 - 99 mg/dL   Dg Chest 2 View  Result Date: 06/06/2017 CLINICAL DATA:  Diplopia EXAM: CHEST  2 VIEW COMPARISON:  03/10/2017 FINDINGS: Low lung volumes. Cardiomegaly with vascular congestion and bibasilar atelectasis. No effusions or acute bony abnormality. IMPRESSION: Low lung volumes with bibasilar atelectasis. Cardiomegaly, vascular congestion. Electronically Signed   By: Charlett Nose M.D.   On: 06/06/2017 10:59   Ct Head Wo Contrast  Result Date: 06/06/2017 CLINICAL DATA:  Diplopia EXAM: CT HEAD WITHOUT CONTRAST TECHNIQUE: Contiguous axial images were obtained from the base of the skull through the vertex without intravenous contrast. COMPARISON:  05/29/2017 FINDINGS: Brain: No  acute intracranial abnormality. Specifically, no hemorrhage, hydrocephalus, mass lesion, acute infarction, or significant intracranial injury. Vascular: No hyperdense vessel or unexpected calcification. Skull: No acute calvarial abnormality. Sinuses/Orbits: Visualized paranasal sinuses and mastoids clear. Orbital soft tissues unremarkable. Other: None IMPRESSION: Normal study. Electronically Signed   By: Charlett Nose M.D.   On: 06/06/2017 10:51   Results for orders placed or performed during the hospital encounter of 06/06/17  Urine culture  Result Value Ref Range   Specimen Description URINE, CLEAN CATCH    Special Requests NONE    Culture      NO  GROWTH Performed at [facility], [address], [place], [place] [address]    Report Status 06/08/2017 FINAL   Rapid strep screen  Result Value Ref Range   Streptococcus, Group A Screen (Direct) NEGATIVE NEGATIVE  Culture, group A strep  Result Value Ref Range   Specimen Description THROAT    Special Requests NONE Reflexed from [id number]    Culture      CULTURE REINCUBATED FOR BETTER GROWTH Performed at [facility], [address], [place], [place] [address]    Report Status PENDING   CBC with Differential  Result Value Ref Range   WBC 5.3 4.0 - 10.5 K/uL   RBC 4.49 4.22 - 5.81 MIL/uL   Hemoglobin 12.7 (L) 13.0 - 17.0 g/dL   HCT 14.7 (L) 82.9 - 56.2 %   MCV 84.2 78.0 - 100.0 fL   MCH 28.3 26.0 - 34.0 pg   MCHC 33.6 30.0 - 36.0 g/dL   RDW 13.0 86.5 - 78.4 %   Platelets 145 (L) 150 - 400 K/uL   Neutrophils Relative % 61 %   Neutro Abs 3.2 1.7 - 7.7 K/uL   Lymphocytes Relative 29 %   Lymphs Abs 1.6 0.7 - 4.0 K/uL   Monocytes Relative 8 %   Monocytes Absolute 0.4 0.1 - 1.0 K/uL   Eosinophils Relative 2 %   Eosinophils Absolute 0.1 0.0 - 0.7 K/uL   Basophils Relative 0 %   Basophils Absolute 0.0 0.0 - 0.1 K/uL  Comprehensive metabolic panel  Result Value Ref Range   Sodium 137 135 - 145 mmol/L   Potassium 3.9 3.5 - 5.1 mmol/L   Chloride 103 101 - 111 mmol/L   CO2 25 22 - 32 mmol/L   Glucose, Bld 299 (H) 65 - 99 mg/dL   BUN 20 6 - 20 mg/dL   Creatinine, Ser 6.96 (H) 0.61 - 1.24 mg/dL   Calcium 8.5 (L) 8.9 - 10.3 mg/dL   Total Protein 7.0 6.5 - 8.1 g/dL   Albumin 4.1 3.5 - 5.0 g/dL   AST 42 (H) 15 - 41 U/L   ALT 75 (H) 17 - 63 U/L   Alkaline Phosphatase 52 38 - 126 U/L   Total Bilirubin 0.4 0.3 - 1.2 mg/dL   GFR calc non Af Amer 52 (L) >60 mL/min   GFR calc Af Amer 60 (L) >60 mL/min   Anion gap 9 5 - 15  Urinalysis, Routine w reflex microscopic  Result Value Ref Range   Color, Urine YELLOW YELLOW   APPearance CLEAR CLEAR   Specific Gravity, Urine 1.019  1.005 - 1.030   pH 5.5 5.0 - 8.0   Glucose, UA >=500 (A) NEGATIVE mg/dL   Hgb urine dipstick NEGATIVE NEGATIVE   Bilirubin Urine NEGATIVE NEGATIVE   Ketones, ur NEGATIVE NEGATIVE mg/dL   Protein, ur NEGATIVE NEGATIVE mg/dL   Nitrite NEGATIVE NEGATIVE  Leukocytes, UA NEGATIVE NEGATIVE  Urinalysis, Microscopic (reflex)  Result Value Ref Range   RBC / HPF NONE SEEN 0 - 5 RBC/hpf   WBC, UA 0-5 0 - 5 WBC/hpf   Bacteria, UA RARE (A) NONE SEEN   Squamous Epithelial / LPF 0-5 (A) NONE SEEN  Protime-INR  Result Value Ref Range   Prothrombin Time 14.1 11.4 - 15.2 seconds   INR 1.09   CBG monitoring, ED  Result Value Ref Range   Glucose-Capillary 336 (H) 65 - 99 mg/dL   Dg Neck Soft Tissue  Result Date: 06/09/2017 CLINICAL DATA:  Acute onset of sore throat and productive cough. Initial encounter. EXAM: NECK SOFT TISSUES - 1+ VIEW COMPARISON:  CT of the cervical spine performed 11/24/2015 FINDINGS: Prevertebral soft tissues are grossly stable in appearance. The patient is status post anterior cervical spinal fusion at C4-C7. The nasopharynx, oropharynx and hypopharynx are grossly unremarkable. The epiglottis is normal in thickness. The proximal trachea is unremarkable. The visualized lung apices are clear. IMPRESSION: Unremarkable radiographs of the soft tissues of the neck. Status post anterior cervical spinal fusion at C4-C7. Electronically Signed   By: Roanna Raider M.D.   On: 06/09/2017 00:07   Dg Chest 2 View  Result Date: 06/06/2017 CLINICAL DATA:  Diplopia EXAM: CHEST  2 VIEW COMPARISON:  03/10/2017 FINDINGS: Low lung volumes. Cardiomegaly with vascular congestion and bibasilar atelectasis. No effusions or acute bony abnormality. IMPRESSION: Low lung volumes with bibasilar atelectasis. Cardiomegaly, vascular congestion. Electronically Signed   By: Charlett Nose M.D.   On: 06/06/2017 10:59   Ct Head Wo Contrast  Result Date: 06/06/2017 CLINICAL DATA:  Diplopia EXAM: CT HEAD WITHOUT CONTRAST  TECHNIQUE: Contiguous axial images were obtained from the base of the skull through the vertex without intravenous contrast. COMPARISON:  05/29/2017 FINDINGS: Brain: No acute intracranial abnormality. Specifically, no hemorrhage, hydrocephalus, mass lesion, acute infarction, or significant intracranial injury. Vascular: No hyperdense vessel or unexpected calcification. Skull: No acute calvarial abnormality. Sinuses/Orbits: Visualized paranasal sinuses and mastoids clear. Orbital soft tissues unremarkable. Other: None IMPRESSION: Normal study. Electronically Signed   By: Charlett Nose M.D.   On: 06/06/2017 10:51    Procedures Procedures   Medications Ordered in ED    Final Clinical Impressions(s) / ED Diagnoses   Follow up with your PMD.  Strict return precautions given with pain with swallowing, change in voice, swelling in the floor of the mouth or the neck, fevers >101, altered level of consciousness,bleeding or any concerns. Follow up with your own doctor for ongoing concerns.   The patient is nontoxic-appearing on exam and vital signs are within normal limits.   I have reviewed the triage vital signs and the nursing notes. Pertinent labs &imaging results that were available during my care of the patient were reviewed by me and considered in my medical decision making (see chart for details).  After history, exam, and medical workup I feel the patient has been appropriately medically screened and is safe for discharge home. Pertinent diagnoses were discussed with the patient. Patient was given return precautions.    Leovanni Bjorkman, MD 06/09/17 1610    Cy Blamer, MD 06/09/17 [id number]

## 2017-06-09 ENCOUNTER — Encounter (HOSPITAL_BASED_OUTPATIENT_CLINIC_OR_DEPARTMENT_OTHER): Payer: Self-pay | Admitting: Emergency Medicine

## 2017-06-09 LAB — CULTURE, GROUP A STREP (THRC)

## 2017-06-09 MED ORDER — SUCRALFATE 1 GM/10ML PO SUSP: 1.0000 g | Freq: Three times a day (TID) | ORAL | 0 refills | Status: DC

## 2017-07-02 ENCOUNTER — Emergency Department (HOSPITAL_BASED_OUTPATIENT_CLINIC_OR_DEPARTMENT_OTHER): Payer: Medicare Other

## 2017-07-02 ENCOUNTER — Observation Stay (HOSPITAL_BASED_OUTPATIENT_CLINIC_OR_DEPARTMENT_OTHER)
Admission: EM | Admit: 2017-07-02 | Discharge: 2017-07-05 | Disposition: A | Discharge status: Home / Self Care | Payer: Medicare Other | Attending: Internal Medicine | Admitting: Internal Medicine

## 2017-07-02 ENCOUNTER — Encounter (HOSPITAL_BASED_OUTPATIENT_CLINIC_OR_DEPARTMENT_OTHER): Payer: Self-pay | Admitting: Emergency Medicine

## 2017-07-02 DIAGNOSIS — G40909 Epilepsy, unspecified, not intractable, without status epilepticus: Secondary | ICD-10-CM | POA: Diagnosis not present

## 2017-07-02 DIAGNOSIS — I10 Essential (primary) hypertension: Secondary | ICD-10-CM | POA: Diagnosis present

## 2017-07-02 DIAGNOSIS — E0590 Thyrotoxicosis, unspecified without thyrotoxic crisis or storm: Secondary | ICD-10-CM | POA: Diagnosis not present

## 2017-07-02 DIAGNOSIS — Z91018 Allergy to other foods: Secondary | ICD-10-CM | POA: Diagnosis not present

## 2017-07-02 DIAGNOSIS — I129 Hypertensive chronic kidney disease with stage 1 through stage 4 chronic kidney disease, or unspecified chronic kidney disease: Secondary | ICD-10-CM | POA: Insufficient documentation

## 2017-07-02 DIAGNOSIS — Z87891 Personal history of nicotine dependence: Secondary | ICD-10-CM | POA: Diagnosis not present

## 2017-07-02 DIAGNOSIS — E119 Type 2 diabetes mellitus without complications: Secondary | ICD-10-CM

## 2017-07-02 DIAGNOSIS — D649 Anemia, unspecified: Secondary | ICD-10-CM | POA: Diagnosis present

## 2017-07-02 DIAGNOSIS — D631 Anemia in chronic kidney disease: Secondary | ICD-10-CM | POA: Diagnosis not present

## 2017-07-02 DIAGNOSIS — J45909 Unspecified asthma, uncomplicated: Secondary | ICD-10-CM | POA: Diagnosis not present

## 2017-07-02 DIAGNOSIS — Z886 Allergy status to analgesic agent status: Secondary | ICD-10-CM | POA: Diagnosis not present

## 2017-07-02 DIAGNOSIS — Z79899 Other long term (current) drug therapy: Secondary | ICD-10-CM | POA: Diagnosis not present

## 2017-07-02 DIAGNOSIS — Z9102 Food additives allergy status: Secondary | ICD-10-CM | POA: Diagnosis not present

## 2017-07-02 DIAGNOSIS — G43909 Migraine, unspecified, not intractable, without status migrainosus: Secondary | ICD-10-CM | POA: Diagnosis not present

## 2017-07-02 DIAGNOSIS — E1165 Type 2 diabetes mellitus with hyperglycemia: Secondary | ICD-10-CM | POA: Diagnosis not present

## 2017-07-02 DIAGNOSIS — E1140 Type 2 diabetes mellitus with diabetic neuropathy, unspecified: Secondary | ICD-10-CM

## 2017-07-02 DIAGNOSIS — Z794 Long term (current) use of insulin: Secondary | ICD-10-CM | POA: Diagnosis not present

## 2017-07-02 DIAGNOSIS — Z888 Allergy status to other drugs, medicaments and biological substances status: Secondary | ICD-10-CM | POA: Insufficient documentation

## 2017-07-02 DIAGNOSIS — G4730 Sleep apnea, unspecified: Secondary | ICD-10-CM | POA: Diagnosis not present

## 2017-07-02 DIAGNOSIS — N182 Chronic kidney disease, stage 2 (mild): Secondary | ICD-10-CM | POA: Diagnosis not present

## 2017-07-02 DIAGNOSIS — K219 Gastro-esophageal reflux disease without esophagitis: Secondary | ICD-10-CM | POA: Diagnosis not present

## 2017-07-02 DIAGNOSIS — F319 Bipolar disorder, unspecified: Secondary | ICD-10-CM | POA: Insufficient documentation

## 2017-07-02 DIAGNOSIS — E1122 Type 2 diabetes mellitus with diabetic chronic kidney disease: Secondary | ICD-10-CM | POA: Diagnosis not present

## 2017-07-02 DIAGNOSIS — Z880 Allergy status to penicillin: Secondary | ICD-10-CM | POA: Diagnosis not present

## 2017-07-02 DIAGNOSIS — Z981 Arthrodesis status: Secondary | ICD-10-CM | POA: Diagnosis not present

## 2017-07-02 DIAGNOSIS — Z881 Allergy status to other antibiotic agents status: Secondary | ICD-10-CM | POA: Diagnosis not present

## 2017-07-02 DIAGNOSIS — Z882 Allergy status to sulfonamides status: Secondary | ICD-10-CM | POA: Insufficient documentation

## 2017-07-02 DIAGNOSIS — G40919 Epilepsy, unspecified, intractable, without status epilepticus: Secondary | ICD-10-CM

## 2017-07-02 DIAGNOSIS — Z8719 Personal history of other diseases of the digestive system: Secondary | ICD-10-CM | POA: Insufficient documentation

## 2017-07-02 DIAGNOSIS — Z7951 Long term (current) use of inhaled steroids: Secondary | ICD-10-CM | POA: Insufficient documentation

## 2017-07-02 DIAGNOSIS — N1831 Chronic kidney disease, stage 3a: Secondary | ICD-10-CM | POA: Diagnosis present

## 2017-07-02 DIAGNOSIS — N183 Chronic kidney disease, stage 3 unspecified: Secondary | ICD-10-CM | POA: Diagnosis present

## 2017-07-02 LAB — CBC WITH DIFFERENTIAL/PLATELET
Basophils Absolute: 0 10*3/uL (ref 0.0–0.1)
Basophils Relative: 0 %
Eosinophils Absolute: 0.1 10*3/uL (ref 0.0–0.7)
Eosinophils Relative: 2 %
HCT: 37.5 % — ABNORMAL LOW (ref 39.0–52.0)
Hemoglobin: 12.6 g/dL — ABNORMAL LOW (ref 13.0–17.0)
Lymphocytes Relative: 38 %
Lymphs Abs: 1.7 10*3/uL (ref 0.7–4.0)
MCH: 28 pg (ref 26.0–34.0)
MCHC: 33.6 g/dL (ref 30.0–36.0)
MCV: 83.3 fL (ref 78.0–100.0)
Monocytes Absolute: 0.3 10*3/uL (ref 0.1–1.0)
Monocytes Relative: 7 %
Neutro Abs: 2.3 10*3/uL (ref 1.7–7.7)
Neutrophils Relative %: 53 %
Platelets: 155 10*3/uL (ref 150–400)
RBC: 4.5 MIL/uL (ref 4.22–5.81)
RDW: 15.4 % (ref 11.5–15.5)
WBC: 4.4 10*3/uL (ref 4.0–10.5)

## 2017-07-02 LAB — BASIC METABOLIC PANEL
Anion gap: 9 (ref 5–15)
BUN: 10 mg/dL (ref 6–20)
CO2: 25 mmol/L (ref 22–32)
Calcium: 9.2 mg/dL (ref 8.9–10.3)
Chloride: 106 mmol/L (ref 101–111)
Creatinine, Ser: 1.33 mg/dL — ABNORMAL HIGH (ref 0.61–1.24)
GFR calc Af Amer: >60 mL/min (ref >60)
GFR calc non Af Amer: 59 mL/min — ABNORMAL LOW (ref >60)
Glucose, Bld: 167 mg/dL — ABNORMAL HIGH (ref 65–99)
Potassium: 3.7 mmol/L (ref 3.5–5.1)
Sodium: 140 mmol/L (ref 135–145)

## 2017-07-02 LAB — ETHANOL: Alcohol, Ethyl (B): <5 mg/dL (ref <5)

## 2017-07-02 LAB — GLUCOSE, CAPILLARY: Glucose-Capillary: 109 mg/dL — ABNORMAL HIGH (ref 65–99)

## 2017-07-02 LAB — CBG MONITORING, ED: Glucose-Capillary: 165 mg/dL — ABNORMAL HIGH (ref 65–99)

## 2017-07-02 MED ORDER — SODIUM CHLORIDE 0.9 % IV SOLN
1000.0000 mg | Freq: Once | INTRAVENOUS | Status: AC
  Administered 2017-07-02: 1000 mg via INTRAVENOUS
  Filled 2017-07-02: qty 10

## 2017-07-02 MED ORDER — PANTOPRAZOLE SODIUM 40 MG PO TBEC
40.0000 mg | DELAYED_RELEASE_TABLET | Freq: Every day | ORAL | Status: DC
  Administered 2017-07-03 – 2017-07-05 (×3): 40 mg via ORAL
  Filled 2017-07-02 (×3): qty 1

## 2017-07-02 MED ORDER — AMITRIPTYLINE HCL 25 MG PO TABS
25.0000 mg | ORAL_TABLET | Freq: Every day | ORAL | Status: DC
  Administered 2017-07-02: 25 mg via ORAL
  Filled 2017-07-02: qty 1

## 2017-07-02 MED ORDER — LEVETIRACETAM 750 MG PO TABS
750.0000 mg | ORAL_TABLET | Freq: Two times a day (BID) | ORAL | Status: DC
  Administered 2017-07-03 – 2017-07-05 (×5): 750 mg via ORAL
  Filled 2017-07-02 (×5): qty 1

## 2017-07-02 MED ORDER — LOSARTAN POTASSIUM 50 MG PO TABS
50.0000 mg | ORAL_TABLET | Freq: Every day | ORAL | Status: DC
  Administered 2017-07-03 – 2017-07-05 (×3): 50 mg via ORAL
  Filled 2017-07-02 (×3): qty 1

## 2017-07-02 MED ORDER — LORAZEPAM 2 MG/ML IJ SOLN
1.0000 mg | Freq: Once | INTRAMUSCULAR | Status: AC
  Administered 2017-07-02: 1 mg via INTRAVENOUS

## 2017-07-02 MED ORDER — LEVETIRACETAM 500 MG/5ML IV SOLN
INTRAVENOUS | Status: AC
  Filled 2017-07-02: qty 5

## 2017-07-02 MED ORDER — LORAZEPAM 2 MG/ML IJ SOLN
INTRAMUSCULAR | Status: AC
  Administered 2017-07-02: 1 mg via INTRAVENOUS
  Filled 2017-07-02: qty 1

## 2017-07-02 MED ORDER — OXYCODONE-ACETAMINOPHEN 5-325 MG PO TABS
1.0000 | ORAL_TABLET | Freq: Four times a day (QID) | ORAL | Status: DC | PRN
  Administered 2017-07-02 – 2017-07-04 (×5): 2 via ORAL
  Filled 2017-07-02 (×5): qty 2

## 2017-07-02 MED ORDER — INSULIN ASPART 100 UNIT/ML ~~LOC~~ SOLN
0.0000 [IU] | Freq: Every day | SUBCUTANEOUS | Status: DC
  Administered 2017-07-04: 4 [IU] via SUBCUTANEOUS

## 2017-07-02 MED ORDER — FLUTICASONE FUROATE-VILANTEROL 200-25 MCG/INH IN AEPB
1.0000 | INHALATION_SPRAY | Freq: Every day | RESPIRATORY_TRACT | Status: DC
  Administered 2017-07-03 – 2017-07-05 (×3): 1 via RESPIRATORY_TRACT
  Filled 2017-07-02 (×2): qty 28

## 2017-07-02 MED ORDER — LORAZEPAM 2 MG/ML IJ SOLN: 1.0000 mg | INTRAMUSCULAR | Status: DC | PRN

## 2017-07-02 MED ORDER — GABAPENTIN 300 MG PO CAPS
300.0000 mg | ORAL_CAPSULE | Freq: Three times a day (TID) | ORAL | Status: DC
  Administered 2017-07-02 – 2017-07-05 (×9): 300 mg via ORAL
  Filled 2017-07-02 (×9): qty 1

## 2017-07-02 MED ORDER — INSULIN ASPART 100 UNIT/ML ~~LOC~~ SOLN
0.0000 [IU] | Freq: Three times a day (TID) | SUBCUTANEOUS | Status: DC
  Administered 2017-07-03: 3 [IU] via SUBCUTANEOUS
  Administered 2017-07-03: 2 [IU] via SUBCUTANEOUS
  Administered 2017-07-03: 1 [IU] via SUBCUTANEOUS
  Administered 2017-07-04: 3 [IU] via SUBCUTANEOUS
  Administered 2017-07-04 (×2): 2 [IU] via SUBCUTANEOUS
  Administered 2017-07-05 (×2): 7 [IU] via SUBCUTANEOUS
  Administered 2017-07-05: 5 [IU] via SUBCUTANEOUS

## 2017-07-02 MED ORDER — INSULIN GLARGINE 100 UNIT/ML ~~LOC~~ SOLN
30.0000 [IU] | Freq: Every day | SUBCUTANEOUS | Status: DC
  Administered 2017-07-03 (×2): 30 [IU] via SUBCUTANEOUS
  Filled 2017-07-02 (×4): qty 0.3

## 2017-07-02 MED ORDER — SODIUM CHLORIDE 0.9 % IV SOLN
INTRAVENOUS | Status: AC
  Administered 2017-07-02: via INTRAVENOUS

## 2017-07-02 MED ORDER — SODIUM CHLORIDE 0.9 % IV SOLN
INTRAVENOUS | Status: DC
  Administered 2017-07-02: 17:00:00 via INTRAVENOUS

## 2017-07-02 MED ORDER — ONDANSETRON HCL 4 MG/2ML IJ SOLN: 4.0000 mg | Freq: Four times a day (QID) | INTRAMUSCULAR | Status: DC | PRN

## 2017-07-02 MED ORDER — ENOXAPARIN SODIUM 40 MG/0.4ML ~~LOC~~ SOLN
40.0000 mg | SUBCUTANEOUS | Status: DC
  Administered 2017-07-03 – 2017-07-05 (×3): 40 mg via SUBCUTANEOUS
  Filled 2017-07-02 (×3): qty 0.4

## 2017-07-02 MED ORDER — ACETAMINOPHEN 650 MG RE SUPP: 650.0000 mg | Freq: Four times a day (QID) | RECTAL | Status: DC | PRN

## 2017-07-02 MED ORDER — ATORVASTATIN CALCIUM 80 MG PO TABS
80.0000 mg | ORAL_TABLET | Freq: Every day | ORAL | Status: DC
  Administered 2017-07-03 – 2017-07-05 (×3): 80 mg via ORAL
  Filled 2017-07-02 (×3): qty 1

## 2017-07-02 MED ORDER — ACETAMINOPHEN 325 MG PO TABS: 650.0000 mg | ORAL_TABLET | Freq: Four times a day (QID) | ORAL | Status: DC | PRN

## 2017-07-02 MED ORDER — MORPHINE SULFATE (PF) 4 MG/ML IV SOLN
4.0000 mg | Freq: Once | INTRAVENOUS | Status: AC
  Administered 2017-07-02: 4 mg via INTRAVENOUS
  Filled 2017-07-02: qty 1

## 2017-07-02 MED ORDER — ZOLPIDEM TARTRATE 5 MG PO TABS
5.0000 mg | ORAL_TABLET | Freq: Every evening | ORAL | Status: DC | PRN
  Administered 2017-07-04: 5 mg via ORAL
  Filled 2017-07-02: qty 1
  Filled 2017-07-02: qty 2

## 2017-07-02 MED ORDER — SENNOSIDES-DOCUSATE SODIUM 8.6-50 MG PO TABS: 1.0000 | ORAL_TABLET | Freq: Every evening | ORAL | Status: DC | PRN

## 2017-07-02 MED ORDER — ONDANSETRON HCL 4 MG PO TABS: 4.0000 mg | ORAL_TABLET | Freq: Four times a day (QID) | ORAL | Status: DC | PRN

## 2017-07-02 MED ORDER — LOPERAMIDE HCL 2 MG PO CAPS
2.0000 mg | ORAL_CAPSULE | Freq: Four times a day (QID) | ORAL | Status: DC | PRN
Start: 2017-07-02 — End: 2017-07-05

## 2017-07-02 MED ORDER — SUCRALFATE 1 GM/10ML PO SUSP
1.0000 g | Freq: Three times a day (TID) | ORAL | Status: DC
  Administered 2017-07-03 – 2017-07-05 (×10): 1 g via ORAL
  Filled 2017-07-02 (×11): qty 10

## 2017-07-02 NOTE — ED Notes (Addendum)
Patient desat when asleep. Placed on 2l/m.

## 2017-07-02 NOTE — ED Notes (Signed)
Pt. Is asking for the EDP to admit him to the Hospital.   Pt[facility] W[facility] to [facility] to [facility].

## 2017-07-02 NOTE — ED Notes (Signed)
Spoke with Mardelle MatteAndy, Pharmacist at [facility] in regards to mixing Keppra. Directed by Mardelle MatteAndy specific directions for medication administration.

## 2017-07-02 NOTE — H&P (Signed)
History and Physical    Nicholas Leaverdward W Tetro XLK:[id number] DOB: 1961/07/30 DOA: 07/02/2017  PCP: System, Pcp Not In   Patient [facility]: [facility]   Chief Complaint: Headache, hyperglycemia, seizures  HPI: Nicholas [name] is a 56 y.o. male with medical history significant for insulin-dependent diabetes mellitus, bipolar disorder, hypertension, and seizures, presenting to the emergency department for evaluation of headaches and hyperglycemia. Patient reports that he been in his usual state of health, but working different shifts and suffering a lack of sufficient sleep. He reports consuming energy drinks to combat his fatigue, but explains that this has resulted in poor control of his blood sugars recently. He reports developing headaches, similar to his migraines, but unrelenting for the past day and a half. He denies any associated vision or hearing change, denies any recent fall or trauma, denies any recent use of alcohol or illicit substances. Denies fevers or chills.   ED Course: Upon arrival to the ED, patient is found to be afebrile, saturating well on room air, and with vitals otherwise stable. The patient had a witnessed generalized seizure in the ED waiting area. He was sent for a noncontrast CT of the head and cervical spine with no skull fracture or hemorrhage, and no acute cervical spine injury identified. EKG feature to sinus rhythm. Chemistry panels notable for serum creatinine 1.33 which appears consistent with his baseline. CBC is notable for very mild and stable normocytic anemia with hemoglobin of 12.6. Ethanol level was undetectable. Patient was given an IVP of Ativan x2 in ED for generalized seizure and was loaded with a gram of Keppra. He remained hemodynamically stable in the ED, in no apparent respiratory distress, and has been [facility] where he will be admitted for ongoing evaluation and management of breakthrough seizures.  Review of  Systems:  All other systems reviewed and apart from HPI, are negative.  Past Medical History:  Diagnosis Date  . Asthma   . Bipolar 1 disorder (HCC)   . Borderline glaucoma   . Epididymal pain    LEFT  . 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 Feeling of incomplete bladder emptying   . Frequency of urination   . Gastric ulcer   . GERD (gastroesophageal reflux disease)   . Hypertension   . Hyperthyroidism    NO MEDS   . Migraine   . Seizures (HCC)   . Type 2 diabetes mellitus (HCC)     Past Surgical History:  Procedure Laterality Date  . ANTERIOR CERVICAL DECOMP/DISCECTOMY FUSION  2007   C4  --  C6  . CERVICAL FUSION    . CHOLECYSTECTOMY    . CYSTOSCOPY N/A 11/09/2013   Procedure: CYSTOSCOPY FLEXIBLE;  Surgeon: Bjorn PippinJohn Wrenn, MD;  [facility] [place];  Service: Urology;  Laterality: N/A;  . EPIDIDYMECTOMY Left 11/09/2013   Procedure:  LEFT EPIDIDYMECTOMY;  Surgeon: Bjorn PippinJohn Wrenn, MD;  [facility] [place];  Service: Urology;  Laterality: Left;  POSSIBLE OUTPATIENT WITH OBSERVATION  . EXCISION LIPOMA LEFT SHOULDER  2004 (APPROX)  . MULTIPLE CYST REMOVED FROM CHEST  AGE 44  . OTHER SURGICAL HISTORY     hemorroid surgery   . TESTICLE REMOVAL Left      reports that he quit smoking about 24 years ago. His smoking use included Cigarettes. He  has a 3.75 pack-year smoking history. He has never used smokeless tobacco. He reports that he does not drink alcohol or use drugs.  Allergies  Allergen Reactions  . Nsaids Other (See Comments)    D/t gastric ulcer  . Tramadol Other (See Comments)    GI UPSET (PT HAS ULCER)  . Amoxicillin Other (See Comments)    THRUSH  . Ampicillin Other (See Comments)    THRUSH  . Dilantin [Phenytoin] Other (See Comments)    SEVERE SKIN FLAKING / PEELING  . Penicillins Other (See Comments)    THRUSH  . Risperidone And Related  Other (See Comments)    hallucinations  . Strawberry Extract Swelling    LIPS SWELL  . Bactrim [Sulfamethoxazole-Trimethoprim] Hives  . Depakote [Divalproex Sodium]     Causes double vision and speech problems   . Oatmeal Hives    History reviewed. No pertinent family history.   Prior to Admission medications   Medication Sig Start Date End Date Taking? Authorizing Provider  amitriptyline (ELAVIL) 25 MG tablet Take 25 mg by mouth at bedtime.    [provider]  atorvastatin (LIPITOR) 80 MG tablet Take 80 mg by mouth daily.    [provider]  ciprofloxacin (CIPRO) 500 MG tablet Take 1 tablet (500 mg total) by mouth 2 (two) times daily. 11/28/16   [name], [name], PA-C  doxycycline (VIBRAMYCIN) 100 MG capsule Take 1 capsule (100 mg total) by mouth 2 (two) times daily. 04/14/17   [name], [name], MD  fluticasone-salmeterol (ADVAIR HFA) [phone] MCG/ACT inhaler Inhale 2 puffs into the lungs daily.    [provider]  gabapentin (NEURONTIN) 300 MG capsule Take 300 mg by mouth 3 (three) times daily.    [provider]  HYDROcodone-acetaminophen (NORCO/VICODIN) 5-325 MG per tablet Take 1-2 tablets by mouth every 6 hours as needed for pain and/or cough. 08/29/15   [name], [name], PA-C  insulin glargine (LANTUS) 100 UNIT/ML injection Inject 35 Units into the skin at bedtime.    [provider]  insulin lispro (HUMALOG) 100 UNIT/ML injection Inject into the skin 3 (three) times daily before meals.    [provider]  levETIRAcetam (KEPPRA) 750 MG tablet Take 1 tablet (750 mg total) by mouth 2 (two) times daily. Patient taking differently: Take 250 mg by mouth 2 (two) times daily.  09/13/16   Lawyer, Cristal Deerhristopher, PA-C  loperamide (IMODIUM A-D) 2 MG tablet Take 1 tablet (2 mg total) by mouth 4 (four) times daily as needed for diarrhea or loose stools. 05/04/17   Vanetta MuldersZackowski, Scott, MD  losartan (COZAAR) 50 MG tablet Take 50 mg by mouth daily.    [provider]  metFORMIN (GLUCOPHAGE) 500 MG tablet Take 1,000 mg by mouth 2 (two) times daily with a meal.     [provider]  metroNIDAZOLE (FLAGYL) 500 MG tablet Take 1 tablet (500 mg total) by mouth 2 (two) times daily. 11/28/16   Law, Waylan BogaAlexandra M, PA-C  omeprazole (PRILOSEC) 20 MG capsule Take 1 capsule (20 mg total) by mouth 2 (two) times daily. 11/08/14   Geoffery Lyonselo, Douglas, MD  oseltamivir (TAMIFLU) 75 MG capsule Take 1 capsule (75 mg total) by mouth every 12 (twelve) hours. 02/14/17   [name], [name], MD  oxyCODONE-acetaminophen (PERCOCET/ROXICET) 5-325 MG tablet Take 1 tablet by mouth every 6 (six) hours as needed for severe pain. 11/28/16   Law, Waylan BogaAlexandra M, PA-C  promethazine (PHENERGAN) 25 MG tablet Take 1 tablet (25 mg total) by mouth every 6 (  six) hours as needed for nausea or vomiting. 05/04/17   Vanetta Mulders, MD  rizatriptan (MAXALT) 10 MG tablet Take 10 mg by mouth as needed for migraine. May repeat in 2 hours if needed    [provider]  sucralfate (CARAFATE) 1 GM/10ML suspension Take 10 mLs (1 g total) by mouth 4 (four) times daily -  with meals and at bedtime. 06/09/17   Palumbo, April, MD    Physical Exam: Vitals:   07/02/17 2100 07/02/17 2130 07/02/17 2200 07/02/17 2250  BP: (!) 138/98 127/90 (!) 124/92 (!) 137/97  Pulse: 69 64 64 68  Resp: 18 17 17  (!) 22  Temp:    97.7 F (36.5 C)  TempSrc:    Oral  SpO2: 100% 100% 100% 95%  Weight:    95.7 kg (210 lb 15.7 oz)  Height:    5\' 7"  (1.702 m)      Constitutional: NAD, calm, comfortable Eyes: PERTLA, lids and conjunctivae normal ENMT: Mucous membranes are moist. Posterior pharynx clear of any exudate or lesions.   Neck: normal, supple, no masses, no thyromegaly Respiratory: clear to auscultation bilaterally, no wheezing, no crackles. Normal respiratory effort.   Cardiovascular: S1 & S2 heard, regular rate and rhythm. No extremity edema. 2+ pedal pulses.   Abdomen: No distension, no tenderness, no masses  palpated. Bowel sounds normal.  Musculoskeletal: no clubbing / cyanosis. No joint deformity upper and lower extremities.    Skin: no significant rashes, lesions, ulcers. Warm, dry, well-perfused. Neurologic: CN 2-12 grossly intact. Sensation intact, DTR normal. Strength 5/5 in all 4 limbs.  Psychiatric: Alert and oriented x 3. Calm and cooperative.     Labs on Admission: I have personally reviewed following labs and imaging studies  CBC:  Recent Labs Lab 07/02/17 1656  WBC 4.4  NEUTROABS 2.3  HGB 12.6*  HCT 37.5*  MCV 83.3  PLT 155   Basic Metabolic Panel:  Recent Labs Lab 07/02/17 1656  NA 140  K 3.7  CL 106  CO2 25  GLUCOSE 167*  BUN 10  CREATININE 1.33*  CALCIUM 9.2   GFR: Estimated Creatinine Clearance: 69.1 mL/min (A) (by C-G formula based on SCr of 1.33 mg/dL (H)). Liver Function Tests: No results for input(s): AST, ALT, ALKPHOS, BILITOT, PROT, ALBUMIN in the last 168 hours. No results for input(s): LIPASE, AMYLASE in the last 168 hours. No results for input(s): AMMONIA in the last 168 hours. Coagulation Profile: No results for input(s): INR, PROTIME in the last 168 hours. Cardiac Enzymes: No results for input(s): CKTOTAL, CKMB, CKMBINDEX, TROPONINI in the last 168 hours. BNP (last 3 results) No results for input(s): PROBNP in the last 8760 hours. HbA1C: No results for input(s): HGBA1C in the last 72 hours. CBG:  Recent Labs Lab 07/02/17 1655 07/02/17 2300  GLUCAP 165* 109*   Lipid Profile: No results for input(s): CHOL, HDL, LDLCALC, TRIG, CHOLHDL, LDLDIRECT in the last 72 hours. Thyroid Function Tests: No results for input(s): TSH, T4TOTAL, FREET4, T3FREE, THYROIDAB in the last 72 hours. Anemia Panel: No results for input(s): VITAMINB12, FOLATE, FERRITIN, TIBC, IRON, RETICCTPCT in the last 72 hours. Urine analysis:    Component Value Date/Time   COLORURINE YELLOW 06/06/2017 1140   APPEARANCEUR CLEAR 06/06/2017 1140   LABSPEC 1.019  06/06/2017 1140   PHURINE 5.5 06/06/2017 1140   GLUCOSEU >=500 (A) 06/06/2017 1140   HGBUR NEGATIVE 06/06/2017 1140   BILIRUBINUR NEGATIVE 06/06/2017 1140   KETONESUR NEGATIVE 06/06/2017 1140   PROTEINUR NEGATIVE 06/06/2017 1140  UROBILINOGEN 0.2 08/29/2015 1125   NITRITE NEGATIVE 06/06/2017 1140   LEUKOCYTESUR NEGATIVE 06/06/2017 1140   Sepsis Labs: @LABRCNTIP (procalcitonin:4,lacticidven:4) )No results found for this or any previous visit (from the past 240 hour(s)).   Radiological Exams on Admission: Ct Head Wo Contrast  Result Date: 07/02/2017 CLINICAL DATA:  Patient with history of seizures, suffered a seizure earlier today. Patient fell during seizure. Seizure on CT table led to motion. EXAM: CT HEAD WITHOUT CONTRAST CT CERVICAL SPINE WITHOUT CONTRAST TECHNIQUE: Multidetector CT imaging of the head and cervical spine was performed following the standard protocol without intravenous contrast. Multiplanar CT image reconstructions of the cervical spine were also generated. COMPARISON:  Multiple priors, most recent 06/06/2017. FINDINGS: The patient was unable to remain motionless for the exam. Small or subtle lesions could be overlooked. CT HEAD FINDINGS Brain: No evidence for acute infarction, hemorrhage, mass lesion, hydrocephalus, or extra-axial fluid. Premature for age atrophy. No definite white matter disease. Vascular: No hyperdense vessel or unexpected calcification. Skull: Normal. Negative for fracture or focal lesion. Sinuses/Orbits: No acute finding. Other: None. CT CERVICAL SPINE FINDINGS Alignment: Anatomic Skull base and vertebrae: No visible cervical spine fracture. Previous C4 through C7 cervical fusion. Soft tissues and spinal canal: No prevertebral fluid or swelling. No visible canal hematoma. Disc levels: The C2-3 and C3-4 levels appear unremarkable. At C4-5, there appears to be a pseudarthrosis at the level of the anterior cervical fusion. There is probable solid arthrodesis  anteriorly at C5 through C7. Unremarkable C7-T1 interspace. Upper chest: Could not be examined because of the patient's condition/seizure activity. Other: None. IMPRESSION: No skull fracture or hemorrhage.  Mild premature atrophy. No cervical spine fracture or traumatic subluxation. 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 Electronically Signed   By: Elsie Stain M.D.   On: 07/02/2017 18:19   Ct Cervical Spine Wo Contrast  Result Date: 07/02/2017 CLINICAL DATA:  Patient with history of seizures, suffered a seizure earlier today. Patient fell during seizure. Seizure on CT table led to motion. EXAM: CT HEAD WITHOUT CONTRAST CT CERVICAL SPINE WITHOUT CONTRAST TECHNIQUE: Multidetector CT imaging of the head and cervical spine was performed following the standard protocol without intravenous contrast. Multiplanar CT image reconstructions of the cervical spine were also generated. COMPARISON:  Multiple priors, most recent 06/06/2017. FINDINGS: The patient was unable to remain motionless for the exam. Small or subtle lesions could be overlooked. CT HEAD FINDINGS Brain: No evidence for acute infarction, hemorrhage, mass lesion, hydrocephalus, or extra-axial fluid. Premature for age atrophy. No definite white matter disease. Vascular: No hyperdense vessel or unexpected calcification. Skull: Normal. Negative for fracture or focal lesion. Sinuses/Orbits: No acute finding. Other: None. CT CERVICAL SPINE FINDINGS Alignment: Anatomic Skull base and vertebrae: No visible cervical spine fracture. Previous C4 through C7 cervical fusion. Soft tissues and spinal canal: No prevertebral fluid or swelling. No visible canal hematoma. Disc levels: The C2-3 and C3-4 levels appear unremarkable. At C4-5, there appears to be a pseudarthrosis at the level of the anterior cervical fusion. There is probable solid arthrodesis anteriorly at C5 through C7. Unremarkable C7-T1 interspace. Upper chest: Could not be examined because of the  patient's condition/seizure activity. Other: None. IMPRESSION: No skull fracture or hemorrhage.  Mild premature atrophy. No cervical spine fracture or traumatic subluxation. 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 Electronically Signed   By: Elsie Stain M.D.   On: 07/02/2017 18:19    EKG: Independently reviewed. Sinus rhythm.   Assessment/Plan  1. Seizures  - Pt had witnessed generalized  seizures in the ED  - He has known seizure disorder, taking Keppra 500 mg BID at home, but reports being told by neurologist to increase to 750 BID  - He was loaded with 1 g Keppra in ED, no Keppra level obtained  - These seizures occurred in setting of headache, and while no focal neurologic deficit is elicited on exam, MRI brain and EEG will be performed  - Continue Keppra at 750 mg BID, use Ativan IVP prn seizure   2. Insulin-dependent DM  - A1c was 7.3% in August '17  - Managed at home with Lantus 35 units qHS and Humalog TID per sliding-scale  - Check CBG with meals and qHS  - Continue Lantus at 30 units qHS with Novolog sliding-scale correctional    3. CKD stage II  - SCr is 1.33 on admission, consistent with apparent baseline \ - Renally-dose medications as needed  4. Anemia  - Hgb is 12.6 on admission, stable relative to priors  - No bleeding evident, likely secondary to CKD   5. Hypertension - BP at goal - Continue losartan    DVT prophylaxis: sq Lovenox  Code Status: Full  Family Communication: Discussed with patient Disposition Plan: Observe on med-surg Consults called: None Admission status: Observation    Briscoe Deutscher, MD [facility] Pager [phone]  If 7PM-7AM, please contact night-coverage [email or web address] Password [email or web address]  07/02/2017, 11:13 PM

## 2017-07-02 NOTE — ED Notes (Signed)
Pt. Resting with eyes closed.  Door is open to Pt. Room so RN can see into room.

## 2017-07-02 NOTE — ED Triage Notes (Addendum)
Pt with witnessed fall and seizure as he was walking in the front door. Pt fell into the seated position and then fell backward hitting his head on the floor. Pt then began seizing. Pt wife advises hx of seizures and takes keppra. Pt had been c/o headache today. c collar placed on pt, pt log rolled onto spine board and lifted onto [employer] stretcher. Pt moved to room 4.

## 2017-07-02 NOTE — ED Notes (Signed)
Pt. Wife called to check on him.  Pt. Wife phone number is  [phone]

## 2017-07-02 NOTE — ED Notes (Signed)
[facility]  Pt. Had small seizure in radiology ... Quick back to baseline.  Pt. Now asking for med for his headache..  Pt. Aware of surroundings.

## 2017-07-02 NOTE — ED Provider Notes (Signed)
[facility] Provider Note   CSN: [id number] Arrival date & time: 07/02/17  1648     History   Chief Complaint Chief Complaint  Patient presents with  . Seizures    HPI Nicholas [name] is a 56 y.o. male.  HPI Patient presents to the emergency room for evaluation of seizures. The patient has a history of epilepsy. He takes Keppra for his seizures. Patient called his wife because he was having trouble with headache and high blood sugars today. Patient came to the emergency room for evaluation. While checking into the emergency room he had a witnessed tonic-clonic seizure lasting less than a minute. He landed on his buttocks but didn't hit his head. Patient was immediately brought back to the emergency room and had another witnessed tonic-clonic activity lasting less than 30 seconds. It now has his eyes open but does not communicate with me. His wife states he has not been having any fevers. No known recent injuries. Past Medical History:  Diagnosis Date  . Asthma   . Bipolar 1 disorder (HCC)   . Borderline glaucoma   . Epididymal pain    LEFT  . 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 Feeling of incomplete bladder emptying   . Frequency of urination   . Gastric ulcer   . GERD (gastroesophageal reflux disease)   . Hypertension   . Hyperthyroidism    NO MEDS   . Migraine   . Seizures (HCC)   . Type 2 diabetes mellitus [facility])     Patient Active Problem List   Diagnosis Date Noted  . Testicular/scrotal pain 11/09/2013  . Microhematuria 11/09/2013  . Condyloma acuminatum of scrotum 11/09/2013    Past Surgical History:  Procedure Laterality Date  . ANTERIOR CERVICAL DECOMP/DISCECTOMY FUSION  2007   C4  --  C6  . CERVICAL FUSION    . CHOLECYSTECTOMY    . CYSTOSCOPY N/A 11/09/2013   Procedure: CYSTOSCOPY FLEXIBLE;  Surgeon: Bjorn Pippin, MD;  Location: [facility];  Service: Urology;  Laterality: N/A;  . EPIDIDYMECTOMY Left 11/09/2013   Procedure:  LEFT EPIDIDYMECTOMY;  Surgeon: Bjorn Pippin, MD;  Location: [facility];  Service: Urology;  Laterality: Left;  POSSIBLE OUTPATIENT WITH OBSERVATION  . EXCISION LIPOMA LEFT SHOULDER  2004 (APPROX)  . MULTIPLE CYST REMOVED FROM CHEST  AGE 89  . OTHER SURGICAL HISTORY     hemorroid surgery   . TESTICLE REMOVAL Left        Home Medications    Prior to Admission medications   Medication Sig Start Date End Date Taking? Authorizing Provider  amitriptyline (ELAVIL) 25 MG tablet Take 25 mg by mouth at bedtime.    [provider]  atorvastatin (LIPITOR) 80 MG tablet Take 80 mg by mouth daily.    [provider]  ciprofloxacin (CIPRO) 500 MG tablet Take 1 tablet (500 mg total) by mouth 2 (two) times daily. 11/28/16   Emi Holes, PA-C  doxycycline (VIBRAMYCIN) 100 MG capsule Take 1 capsule (100 mg total) by mouth 2 (two) times daily. 04/14/17   Linwood Dibbles, MD  fluticasone-salmeterol (ADVAIR HFA) [phone] MCG/ACT inhaler Inhale 2 puffs into the lungs daily.    [provider]  gabapentin (NEURONTIN) 300 MG capsule Take 300 mg by mouth 3 (three) times daily.    [provider]  HYDROcodone-acetaminophen (NORCO/VICODIN) 5-325 MG per tablet Take 1-2 tablets by mouth every 6 hours as needed for pain and/or cough. 08/29/15   [name], [name], PA-C  insulin glargine (LANTUS) 100 UNIT/ML injection Inject 35 Units into the skin at bedtime.    [provider]  insulin lispro (HUMALOG) 100 UNIT/ML injection Inject into the skin 3 (three) times daily before meals.    [provider]  levETIRAcetam (KEPPRA) 750 MG tablet Take 1 tablet (750 mg total) by mouth 2 (two) times daily. Patient taking differently: Take 250 mg by mouth 2 (two) times daily.  09/13/16   Lawyer, Cristal Deerhristopher, PA-C  loperamide (IMODIUM A-D) 2 MG tablet Take 1 tablet (2 mg total) by  mouth 4 (four) times daily as needed for diarrhea or loose stools. 05/04/17   Vanetta MuldersZackowski, Scott, MD  losartan (COZAAR) 50 MG tablet Take 50 mg by mouth daily.    [provider]  metFORMIN (GLUCOPHAGE) 500 MG tablet Take 1,000 mg by mouth 2 (two) times daily with a meal.     [provider]  metroNIDAZOLE (FLAGYL) 500 MG tablet Take 1 tablet (500 mg total) by mouth 2 (two) times daily. 11/28/16   Law, Waylan BogaAlexandra M, PA-C  omeprazole (PRILOSEC) 20 MG capsule Take 1 capsule (20 mg total) by mouth 2 (two) times daily. 11/08/14   Geoffery Lyonselo, Douglas, MD  oseltamivir (TAMIFLU) 75 MG capsule Take 1 capsule (75 mg total) by mouth every 12 (twelve) hours. 02/14/17   [name], [name], MD  oxyCODONE-acetaminophen (PERCOCET/ROXICET) 5-325 MG tablet Take 1 tablet by mouth every 6 (six) hours as needed for severe pain. 11/28/16   Law, Waylan BogaAlexandra M, PA-C  promethazine (PHENERGAN) 25 MG tablet Take 1 tablet (25 mg total) by mouth every 6 (six) hours as needed for nausea or vomiting. 05/04/17   Vanetta MuldersZackowski, Scott, MD  rizatriptan (MAXALT) 10 MG tablet Take 10 mg by mouth as needed for migraine. May repeat in 2 hours if needed    [provider]  sucralfate (CARAFATE) 1 GM/10ML suspension Take 10 mLs (1 g total) by mouth 4 (four) times daily -  with meals and at bedtime. 06/09/17   Palumbo, April, MD    Family History No family history on file.  Social History Social History  Substance Use Topics  . Smoking status: Former Smoker    Packs/day: 0.25    Years: 15.00    Types: Cigarettes    Quit date: 11/08/1992  . Smokeless tobacco: Never Used  . Alcohol use No     Allergies   Nsaids; Tramadol; Amoxicillin; Ampicillin; Dilantin [phenytoin]; Penicillins; Risperidone and related; Strawberry extract; Bactrim [sulfamethoxazole-trimethoprim]; Depakote [divalproex sodium]; and Oatmeal   Review of Systems Review of Systems  All other systems reviewed and are negative.    Physical Exam Updated Vital  Signs BP (!) 117/92   Pulse 68   Resp (!) 27   SpO2 95%   Physical Exam  Constitutional: He appears well-developed and well-nourished.  HENT:  Head: Normocephalic and atraumatic.  Right Ear: External ear normal.  Left Ear: External ear normal.  Eyes: Conjunctivae are normal. Right eye exhibits no discharge. Left eye exhibits no discharge. No scleral icterus.  Neck: Neck supple. No tracheal deviation present.  Cardiovascular: Normal rate, regular rhythm and intact distal pulses.   Pulmonary/Chest: Effort normal and breath sounds normal. No stridor. No respiratory distress. He has no wheezes. He has no rales.  Abdominal: Soft. Bowel sounds are normal. He exhibits no distension. There is no tenderness. There  is no rebound and no guarding.  Musculoskeletal: He exhibits no edema or tenderness.  Neurological: He is alert. No cranial nerve deficit (no facial droop, ). He exhibits normal muscle tone. He displays seizure activity. GCS eye subscore is 4. GCS verbal subscore is 1. GCS motor subscore is 5.  Skin: Skin is warm and dry. No rash noted.  Psychiatric: He has a normal mood and affect.  Nursing note and vitals reviewed.    ED Treatments / Results  Labs (all labs ordered are listed, but only abnormal results are displayed) Labs Reviewed  BASIC METABOLIC PANEL - Abnormal; Notable for the following:       Result Value   Glucose, Bld 167 (*)    Creatinine, Ser 1.33 (*)    GFR calc non Af Amer 59 (*)    All other components within normal limits  CBC WITH DIFFERENTIAL/PLATELET - Abnormal; Notable for the following:    Hemoglobin 12.6 (*)    HCT 37.5 (*)    All other components within normal limits  CBG MONITORING, ED - Abnormal; Notable for the following:    Glucose-Capillary 165 (*)    All other components within normal limits  ETHANOL    EKG  EKG Interpretation  Date/Time:  Friday July 02 2017 16:58:01 EDT Ventricular Rate:  72 PR Interval:    QRS Duration: 98 QT  Interval:  402 QTC Calculation: 440 R Axis:   -23 Text Interpretation:  Sinus rhythm Borderline left axis deviation Borderline low voltage, extremity leads No significant change since last tracing Confirmed by Linwood Dibbles [phone]) on 07/02/2017 5:04:34 PM       Radiology Ct Head Wo Contrast  Result Date: 07/02/2017 CLINICAL DATA:  Patient with history of seizures, suffered a seizure earlier today. Patient fell during seizure. Seizure on CT table led to motion. EXAM: CT HEAD WITHOUT CONTRAST CT CERVICAL SPINE WITHOUT CONTRAST TECHNIQUE: Multidetector CT imaging of the head and cervical spine was performed following the standard protocol without intravenous contrast. Multiplanar CT image reconstructions of the cervical spine were also generated. COMPARISON:  Multiple priors, most recent 06/06/2017. FINDINGS: The patient was unable to remain motionless for the exam. Small or subtle lesions could be overlooked. CT HEAD FINDINGS Brain: No evidence for acute infarction, hemorrhage, mass lesion, hydrocephalus, or extra-axial fluid. Premature for age atrophy. No definite white matter disease. Vascular: No hyperdense vessel or unexpected calcification. Skull: Normal. Negative for fracture or focal lesion. Sinuses/Orbits: No acute finding. Other: None. CT CERVICAL SPINE FINDINGS Alignment: Anatomic Skull base and vertebrae: No visible cervical spine fracture. Previous C4 through C7 cervical fusion. Soft tissues and spinal canal: No prevertebral fluid or swelling. No visible canal hematoma. Disc levels: The C2-3 and C3-4 levels appear unremarkable. At C4-5, there appears to be a pseudarthrosis at the level of the anterior cervical fusion. There is probable solid arthrodesis anteriorly at C5 through C7. Unremarkable C7-T1 interspace. Upper chest: Could not be examined because of the patient's condition/seizure activity. Other: None. IMPRESSION: No skull fracture or hemorrhage.  Mild premature atrophy. No cervical spine  fracture or traumatic subluxation. 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 Electronically Signed   By: Elsie Stain M.D.   On: 07/02/2017 18:19   Ct Cervical Spine Wo Contrast  Result Date: 07/02/2017 CLINICAL DATA:  Patient with history of seizures, suffered a seizure earlier today. Patient fell during seizure. Seizure on CT table led to motion. EXAM: CT HEAD WITHOUT CONTRAST CT CERVICAL SPINE WITHOUT CONTRAST TECHNIQUE: Multidetector CT imaging  of the head and cervical spine was performed following the standard protocol without intravenous contrast. Multiplanar CT image reconstructions of the cervical spine were also generated. COMPARISON:  Multiple priors, most recent 06/06/2017. FINDINGS: The patient was unable to remain motionless for the exam. Small or subtle lesions could be overlooked. CT HEAD FINDINGS Brain: No evidence for acute infarction, hemorrhage, mass lesion, hydrocephalus, or extra-axial fluid. Premature for age atrophy. No definite white matter disease. Vascular: No hyperdense vessel or unexpected calcification. Skull: Normal. Negative for fracture or focal lesion. Sinuses/Orbits: No acute finding. Other: None. CT CERVICAL SPINE FINDINGS Alignment: Anatomic Skull base and vertebrae: No visible cervical spine fracture. Previous C4 through C7 cervical fusion. Soft tissues and spinal canal: No prevertebral fluid or swelling. No visible canal hematoma. Disc levels: The C2-3 and C3-4 levels appear unremarkable. At C4-5, there appears to be a pseudarthrosis at the level of the anterior cervical fusion. There is probable solid arthrodesis anteriorly at C5 through C7. Unremarkable C7-T1 interspace. Upper chest: Could not be examined because of the patient's condition/seizure activity. Other: None. IMPRESSION: No skull fracture or hemorrhage.  Mild premature atrophy. No cervical spine fracture or traumatic subluxation. 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 Electronically Signed   By:  Elsie StainJohn T Curnes M.D.   On: 07/02/2017 18:19    Procedures Procedures (including critical care time)  Medications Ordered in ED Medications  0.9 %  sodium chloride infusion ( Intravenous New Bag/Given 07/02/17 1729)  levETIRAcetam (KEPPRA) 500 MG/5ML injection (not administered)  levETIRAcetam (KEPPRA) 500 MG/5ML injection (not administered)  LORazepam (ATIVAN) injection 1 mg (1 mg Intravenous Given 07/02/17 1704)  levETIRAcetam (KEPPRA) 1,000 mg in sodium chloride 0.9 % 100 mL IVPB (0 mg Intravenous Stopped 07/02/17 1757)  LORazepam (ATIVAN) injection 1 mg (1 mg Intravenous Given 07/02/17 1654)  morphine 4 MG/ML injection 4 mg (4 mg Intravenous Given 07/02/17 1831)     Initial Impression / Assessment and Plan / ED Course  I have reviewed the triage vital signs and the nursing notes.  Pertinent labs & imaging results that were available during my care of the patient were reviewed by me and considered in my medical decision making (see chart for details).  Clinical Course as of Jul 02 2000  Fri Jul 02, 2017  1705 Pt was given 1 mg ativan.  He had another seizure.  I will give another dose of ativan and 1 gm keppra  [JK]  1755 Pt is more alert.  Now speaking.  Complains of heaache.  Imaging tests pending  [JK]  1843 Collar removed.  Will continue to observe  [JK]  1958 No more seizure activity but pt states he does not feel at his baseline.  His vision feels blurred.  He normally does not have this many seizures.  Pt would like to be admitted to [facility].  [JK]  1959 No beds at [place] [facility].  I will arrange for [place]  [JK]    Clinical Course User Index [JK] [name], Ryann Leavitt, MD    Patient presented to the emergency room with frequent seizures. Patient has a history of epilepsy but states he normally does not have this many seizures. He is concerned that something is triggering them.  Pt is stable at this point but he has had several witnessed seizures.  I will consult with medical  [facility] for admission so they can have neurologic consultation.  Final Clinical Impressions(s) / ED Diagnoses   Final diagnoses:  Seizure disorder (  HCC)      Linwood Dibbles, MD 07/03/17 [phone]

## 2017-07-03 ENCOUNTER — Observation Stay (HOSPITAL_COMMUNITY): Payer: Medicare Other

## 2017-07-03 DIAGNOSIS — I10 Essential (primary) hypertension: Secondary | ICD-10-CM | POA: Diagnosis present

## 2017-07-03 DIAGNOSIS — G43111 Migraine with aura, intractable, with status migrainosus: Secondary | ICD-10-CM

## 2017-07-03 DIAGNOSIS — E118 Type 2 diabetes mellitus with unspecified complications: Secondary | ICD-10-CM

## 2017-07-03 DIAGNOSIS — R079 Chest pain, unspecified: Secondary | ICD-10-CM | POA: Diagnosis not present

## 2017-07-03 DIAGNOSIS — E1122 Type 2 diabetes mellitus with diabetic chronic kidney disease: Secondary | ICD-10-CM | POA: Diagnosis not present

## 2017-07-03 DIAGNOSIS — G43709 Chronic migraine without aura, not intractable, without status migrainosus: Secondary | ICD-10-CM

## 2017-07-03 DIAGNOSIS — G40909 Epilepsy, unspecified, not intractable, without status epilepticus: Secondary | ICD-10-CM | POA: Diagnosis not present

## 2017-07-03 DIAGNOSIS — N183 Chronic kidney disease, stage 3 (moderate): Secondary | ICD-10-CM | POA: Diagnosis not present

## 2017-07-03 DIAGNOSIS — G40919 Epilepsy, unspecified, intractable, without status epilepticus: Secondary | ICD-10-CM | POA: Diagnosis not present

## 2017-07-03 DIAGNOSIS — G43109 Migraine with aura, not intractable, without status migrainosus: Secondary | ICD-10-CM | POA: Diagnosis not present

## 2017-07-03 DIAGNOSIS — I129 Hypertensive chronic kidney disease with stage 1 through stage 4 chronic kidney disease, or unspecified chronic kidney disease: Secondary | ICD-10-CM | POA: Diagnosis not present

## 2017-07-03 DIAGNOSIS — E1165 Type 2 diabetes mellitus with hyperglycemia: Secondary | ICD-10-CM

## 2017-07-03 DIAGNOSIS — G43909 Migraine, unspecified, not intractable, without status migrainosus: Secondary | ICD-10-CM | POA: Diagnosis not present

## 2017-07-03 LAB — GLUCOSE, CAPILLARY
Glucose-Capillary: 127 mg/dL — ABNORMAL HIGH (ref 65–99)
Glucose-Capillary: 202 mg/dL — ABNORMAL HIGH (ref 65–99)
Glucose-Capillary: 166 mg/dL — ABNORMAL HIGH (ref 65–99)
Glucose-Capillary: 154 mg/dL — ABNORMAL HIGH (ref 65–99)

## 2017-07-03 LAB — CBC
HCT: 35.8 % — ABNORMAL LOW (ref 39.0–52.0)
Hemoglobin: 11.7 g/dL — ABNORMAL LOW (ref 13.0–17.0)
MCH: 27.3 pg (ref 26.0–34.0)
MCHC: 32.7 g/dL (ref 30.0–36.0)
MCV: 83.6 fL (ref 78.0–100.0)
Platelets: 151 10*3/uL (ref 150–400)
RBC: 4.28 MIL/uL (ref 4.22–5.81)
RDW: 15.4 % (ref 11.5–15.5)
WBC: 4.8 10*3/uL (ref 4.0–10.5)

## 2017-07-03 LAB — RAPID URINE DRUG SCREEN, HOSP PERFORMED
Amphetamines: NOT DETECTED
Barbiturates: NOT DETECTED
Benzodiazepines: NOT DETECTED
Cocaine: NOT DETECTED
Opiates: NOT DETECTED
Tetrahydrocannabinol: NOT DETECTED

## 2017-07-03 LAB — BASIC METABOLIC PANEL
Anion gap: 6 (ref 5–15)
BUN: 7 mg/dL (ref 6–20)
CO2: 25 mmol/L (ref 22–32)
Calcium: 8.7 mg/dL — ABNORMAL LOW (ref 8.9–10.3)
Chloride: 108 mmol/L (ref 101–111)
Creatinine, Ser: 1.16 mg/dL (ref 0.61–1.24)
GFR calc Af Amer: >60 mL/min (ref >60)
GFR calc non Af Amer: >60 mL/min (ref >60)
Glucose, Bld: 134 mg/dL — ABNORMAL HIGH (ref 65–99)
Potassium: 3.8 mmol/L (ref 3.5–5.1)
Sodium: 139 mmol/L (ref 135–145)

## 2017-07-03 LAB — LIPID PANEL
Cholesterol: 143 mg/dL (ref 0–200)
HDL: 29 mg/dL — ABNORMAL LOW (ref >40)
LDL Cholesterol: 60 mg/dL (ref 0–99)
Total CHOL/HDL Ratio: 4.9 RATIO
Triglycerides: 270 mg/dL — ABNORMAL HIGH (ref <150)
VLDL: 54 mg/dL — ABNORMAL HIGH (ref 0–40)

## 2017-07-03 LAB — HIV ANTIBODY (ROUTINE TESTING W REFLEX): HIV Screen 4th Generation wRfx: NONREACTIVE

## 2017-07-03 LAB — TROPONIN I: Troponin I: <0.03 ng/mL (ref <0.03)

## 2017-07-03 LAB — MRSA PCR SCREENING: MRSA by PCR: NEGATIVE

## 2017-07-03 MED ORDER — VITAMIN B-1 100 MG PO TABS
100.0000 mg | ORAL_TABLET | Freq: Every day | ORAL | Status: DC
  Administered 2017-07-03 – 2017-07-05 (×3): 100 mg via ORAL
  Filled 2017-07-03 (×3): qty 1

## 2017-07-03 MED ORDER — SUMATRIPTAN SUCCINATE 6 MG/0.5ML ~~LOC~~ SOLN
6.0000 mg | Freq: Once | SUBCUTANEOUS | Status: AC
  Administered 2017-07-03: 6 mg via SUBCUTANEOUS
  Filled 2017-07-03: qty 0.5

## 2017-07-03 MED ORDER — LORAZEPAM 2 MG/ML IJ SOLN: 1.0000 mg | INTRAMUSCULAR | Status: DC | PRN

## 2017-07-03 MED ORDER — FOLIC ACID 1 MG PO TABS
1.0000 mg | ORAL_TABLET | Freq: Every day | ORAL | Status: DC
  Administered 2017-07-03 – 2017-07-05 (×3): 1 mg via ORAL
  Filled 2017-07-03 (×3): qty 1

## 2017-07-03 MED ORDER — ALUM & MAG HYDROXIDE-SIMETH 200-200-20 MG/5ML PO SUSP
30.0000 mL | ORAL | Status: DC | PRN
  Administered 2017-07-03: 30 mL via ORAL
  Filled 2017-07-03: qty 30

## 2017-07-03 MED ORDER — SODIUM CHLORIDE 0.9 % IV SOLN
75.0000 mL/h | INTRAVENOUS | Status: DC
  Administered 2017-07-03: 75 mL/h via INTRAVENOUS

## 2017-07-03 MED ORDER — AMITRIPTYLINE HCL 25 MG PO TABS
35.0000 mg | ORAL_TABLET | Freq: Every day | ORAL | Status: DC
  Administered 2017-07-03: 35 mg via ORAL
  Filled 2017-07-03: qty 1

## 2017-07-03 NOTE — Progress Notes (Signed)
PROGRESS NOTE    Nicholas [name]  UJW:[id number] DOB: 1961/05/24 DOA: 07/02/2017 PCP: System, Pcp Not In   Brief Narrative:  56 y.o. BM PMHx DM type II on insulin, Bipolar Disorder, HTN, Seizures, Epilepsy, grand mal, Hyperthyroidism (not on medication per chart). S/P effusion C-spine 2 last surgery 2016  Presenting to the emergency department for evaluation of headaches and hyperglycemia. Patient reports that he been in his usual state of health, but working different shifts and suffering a lack of sufficient sleep. He reports consuming energy drinks to combat his fatigue, but explains that this has resulted in poor control of his blood sugars recently. He reports developing headaches, similar to his migraines, but unrelenting for the past day and a half. He denies any associated vision or hearing change, denies any recent fall or trauma, denies any recent use of alcohol or illicit substances. Denies fevers or chills.   ED Course: Upon arrival to the ED, patient is found to be afebrile, saturating well on room air, and with vitals otherwise stable. The patient had a witnessed generalized seizure in the ED waiting area. He was sent for a noncontrast CT of the head and cervical spine with no skull fracture or hemorrhage, and no acute cervical spine injury identified. EKG feature to sinus rhythm. Chemistry panels notable for serum creatinine 1.33 which appears consistent with his baseline. CBC is notable for very mild and stable normocytic anemia with hemoglobin of 12.6. Ethanol level was undetectable. Patient was given an IVP of Ativan x2 in ED for generalized seizure and was loaded with a gram of Keppra. He remained hemodynamically stable in the ED, in no apparent respiratory distress, and has been accepted in transfer to [facility] where he will be admitted for ongoing evaluation and management of breakthrough seizures.   Subjective: 7/28 A/O 4, negative CP, negative SOB, negative N/V.  Positive Migraine. States has migraines often secondary to his neck surgery. States had multiple injuries to back and neck which resulted in need for C-spine fusion. States has Aura  prior to seizure consisting of diplopia + metallic taste in mouth + migraine. Last admitted for migraine in June 2018.   Assessment & Plan:   Principal Problem:   Seizure disorder (HCC) Active Problems:   Insulin-requiring or dependent type II diabetes mellitus (HCC)   CKD (chronic kidney disease), stage II   Normocytic anemia   Essential hypertension   Breakthrough Seizures  -Seizures do not appear to be well controlled as patient having them ~1 time per month - Pt had witnessed generalized seizures in the ED  - CT head/neck nondiagnostic for cause of seizure. See results below -MRI brain nondiagnostic cause seizures see results below -7/28 EEG results pending -Continue Keppra 750 mg BID (increased from 500 mg BID)  -Continue seizure protocol  -Will consult Neurology in A.m. after workup complete  Migraine -Patient on Maxalt at home for migraines however must use with caution in patient with seizures.  -Sumatriptan 6 mg subcutaneous 1 in order to break headache. -Increase Amitriptyline to 35 mg QHS   DM Type 2 Uncontrolled with complications  - A1c was 7.3% in August '17  -Hemoglobin A1c pending -Lipid panel pending - Managed at home with Lantus 35 units qHS and Humalog TID per sliding-scale  - Check CBG with meals and qHS  - Lantus at 30 units qHS  -Sensitive SSI     CKD stage II (baseline Cr 1.33) Lab Results  Component Value Date   CREATININE  1.16 07/03/2017   CREATININE 1.33 (H) 07/02/2017   CREATININE 1.48 (H) 06/06/2017  -a baseline   Anemia  - Hgb is 12.6 on admission, stable relative to priors  - No bleeding evident, likely secondary to CKD   Essential Hypertension - BP at goal - Continue losartan    DVT prophylaxis: Lovenox Code Status: Full Family Communication:  None Disposition Plan: TBD   Consultants:  None    Procedures/Significant Events:  7/27 CT C-spine/head:-Negative fracture or hemorrhage--negative C-spine fracture or traumatic subluxation -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 7/28 MRI brain: Nondiagnostic   VENTILATOR SETTINGS: None   Cultures None  Antimicrobials: Anti-infectives    None       Devices    LINES / TUBES:      Continuous Infusions: . sodium chloride 100 mL/hr at 07/02/17 2334     Objective: Vitals:   07/03/17 0400 07/03/17 0505 07/03/17 0510 07/03/17 0723  BP: 127/87   (!) 134/92  Pulse: 63 62  70  Resp: 14   16  Temp:  (!) 97.3 F (36.3 C) 98.3 F (36.8 C) (!) 97.4 F (36.3 C)  TempSrc:  Axillary Oral Oral  SpO2: 99%   94%  Weight:      Height:        Intake/Output Summary (Last 24 hours) at 07/03/17 0827 Last data filed at 07/03/17 [id number]  Gross per 24 hour  Intake           443.33 ml  Output              625 ml  Net          -181.67 ml   Filed Weights   07/02/17 2250  Weight: 210 lb 15.7 oz (95.7 kg)    Examination:  General: A/O 4, No acute respiratory distress Eyes: negative scleral hemorrhage, negative anisocoria, negative icterus ENT: Negative Runny nose, negative gingival bleeding, Neck:  Negative scars, masses, torticollis, lymphadenopathy, JVD Lungs: Clear to auscultation bilaterally without wheezes or crackles Cardiovascular: Regular rate and rhythm without murmur gallop or rub normal S1 and S2 Abdomen: negative abdominal pain, nondistended, positive soft, bowel sounds, no rebound, no ascites, no appreciable mass Extremities: No significant cyanosis, clubbing, or edema bilateral lower extremities Skin: Negative rashes, lesions, ulcers Psychiatric:  Negative depression, negative anxiety, negative fatigue, negative mania  Central nervous system:  Cranial nerves II through XII intact, tongue/uvula midline, all extremities muscle strength 5/5, sensation  intact throughout, negative dysarthria, negative expressive aphasia, negative receptive aphasia.  .     Data Reviewed: Care during the described time interval was provided by me .  I have reviewed this patient's available data, including medical history, events of note, physical examination, and all test results as part of my evaluation. I have personally reviewed and interpreted all radiology studies.  CBC:  Recent Labs Lab 07/02/17 1656 07/03/17 0249  WBC 4.4 4.8  NEUTROABS 2.3  --   HGB 12.6* 11.7*  HCT 37.5* 35.8*  MCV 83.3 83.6  PLT 155 151   Basic Metabolic Panel:  Recent Labs Lab 07/02/17 1656 07/03/17 0249  NA 140 139  K 3.7 3.8  CL 106 108  CO2 25 25  GLUCOSE 167* 134*  BUN 10 7  CREATININE 1.33* 1.16  CALCIUM 9.2 8.7*   GFR: Estimated Creatinine Clearance: 79.3 mL/min (by C-G formula based on SCr of 1.16 mg/dL). Liver Function Tests: No results for input(s): AST, ALT, ALKPHOS, BILITOT, PROT, ALBUMIN in the last 168 hours. No  results for input(s): LIPASE, AMYLASE in the last 168 hours. No results for input(s): AMMONIA in the last 168 hours. Coagulation Profile: No results for input(s): INR, PROTIME in the last 168 hours. Cardiac Enzymes: No results for input(s): CKTOTAL, CKMB, CKMBINDEX, TROPONINI in the last 168 hours. BNP (last 3 results) No results for input(s): PROBNP in the last 8760 hours. HbA1C: No results for input(s): HGBA1C in the last 72 hours. CBG:  Recent Labs Lab 07/02/17 1655 07/02/17 2300  GLUCAP 165* 109*   Lipid Profile: No results for input(s): CHOL, HDL, LDLCALC, TRIG, CHOLHDL, LDLDIRECT in the last 72 hours. Thyroid Function Tests: No results for input(s): TSH, T4TOTAL, FREET4, T3FREE, THYROIDAB in the last 72 hours. Anemia Panel: No results for input(s): VITAMINB12, FOLATE, FERRITIN, TIBC, IRON, RETICCTPCT in the last 72 hours. Urine analysis:    Component Value Date/Time   COLORURINE YELLOW 06/06/2017 1140    APPEARANCEUR CLEAR 06/06/2017 1140   LABSPEC 1.019 06/06/2017 1140   PHURINE 5.5 06/06/2017 1140   GLUCOSEU >=500 (A) 06/06/2017 1140   HGBUR NEGATIVE 06/06/2017 1140   BILIRUBINUR NEGATIVE 06/06/2017 1140   KETONESUR NEGATIVE 06/06/2017 1140   PROTEINUR NEGATIVE 06/06/2017 1140   UROBILINOGEN 0.2 08/29/2015 1125   NITRITE NEGATIVE 06/06/2017 1140   LEUKOCYTESUR NEGATIVE 06/06/2017 1140   Sepsis Labs: @LABRCNTIP (procalcitonin:4,lacticidven:4)  ) Recent Results (from the past 240 hour(s))  MRSA PCR Screening     Status: None   Collection Time: 07/02/17 11:54 PM  Result Value Ref Range Status   MRSA by PCR NEGATIVE NEGATIVE Final    Comment:        The GeneXpert MRSA Assay (FDA approved for NASAL specimens only), is one component of a comprehensive MRSA colonization surveillance program. It is not intended to diagnose MRSA infection nor to guide or monitor treatment for MRSA infections.          Radiology Studies: Ct Head Wo Contrast  Result Date: 07/02/2017 CLINICAL DATA:  Patient with history of seizures, suffered a seizure earlier today. Patient fell during seizure. Seizure on CT table led to motion. EXAM: CT HEAD WITHOUT CONTRAST CT CERVICAL SPINE WITHOUT CONTRAST TECHNIQUE: Multidetector CT imaging of the head and cervical spine was performed following the standard protocol without intravenous contrast. Multiplanar CT image reconstructions of the cervical spine were also generated. COMPARISON:  Multiple priors, most recent 06/06/2017. FINDINGS: The patient was unable to remain motionless for the exam. Small or subtle lesions could be overlooked. CT HEAD FINDINGS Brain: No evidence for acute infarction, hemorrhage, mass lesion, hydrocephalus, or extra-axial fluid. Premature for age atrophy. No definite white matter disease. Vascular: No hyperdense vessel or unexpected calcification. Skull: Normal. Negative for fracture or focal lesion. Sinuses/Orbits: No acute finding.  Other: None. CT CERVICAL SPINE FINDINGS Alignment: Anatomic Skull base and vertebrae: No visible cervical spine fracture. Previous C4 through C7 cervical fusion. Soft tissues and spinal canal: No prevertebral fluid or swelling. No visible canal hematoma. Disc levels: The C2-3 and C3-4 levels appear unremarkable. At C4-5, there appears to be a pseudarthrosis at the level of the anterior cervical fusion. There is probable solid arthrodesis anteriorly at C5 through C7. Unremarkable C7-T1 interspace. Upper chest: Could not be examined because of the patient's condition/seizure activity. Other: None. IMPRESSION: No skull fracture or hemorrhage.  Mild premature atrophy. No cervical spine fracture or traumatic subluxation. 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 Electronically Signed   By: Elsie StainJohn T Curnes M.D.   On: 07/02/2017 18:19   Ct Cervical Spine Wo Contrast  Result Date: 07/02/2017 CLINICAL DATA:  Patient with history of seizures, suffered a seizure earlier today. Patient fell during seizure. Seizure on CT table led to motion. EXAM: CT HEAD WITHOUT CONTRAST CT CERVICAL SPINE WITHOUT CONTRAST TECHNIQUE: Multidetector CT imaging of the head and cervical spine was performed following the standard protocol without intravenous contrast. Multiplanar CT image reconstructions of the cervical spine were also generated. COMPARISON:  Multiple priors, most recent 06/06/2017. FINDINGS: The patient was unable to remain motionless for the exam. Small or subtle lesions could be overlooked. CT HEAD FINDINGS Brain: No evidence for acute infarction, hemorrhage, mass lesion, hydrocephalus, or extra-axial fluid. Premature for age atrophy. No definite white matter disease. Vascular: No hyperdense vessel or unexpected calcification. Skull: Normal. Negative for fracture or focal lesion. Sinuses/Orbits: No acute finding. Other: None. CT CERVICAL SPINE FINDINGS Alignment: Anatomic Skull base and vertebrae: No visible cervical spine  fracture. Previous C4 through C7 cervical fusion. Soft tissues and spinal canal: No prevertebral fluid or swelling. No visible canal hematoma. Disc levels: The C2-3 and C3-4 levels appear unremarkable. At C4-5, there appears to be a pseudarthrosis at the level of the anterior cervical fusion. There is probable solid arthrodesis anteriorly at C5 through C7. Unremarkable C7-T1 interspace. Upper chest: Could not be examined because of the patient's condition/seizure activity. Other: None. IMPRESSION: No skull fracture or hemorrhage.  Mild premature atrophy. No cervical spine fracture or traumatic subluxation. 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 Electronically Signed   By: Elsie Stain M.D.   On: 07/02/2017 18:19   Mr Brain Wo Contrast  Result Date: 07/03/2017 CLINICAL DATA:  56 y/o  M; breakthrough seizure. EXAM: MRI HEAD WITHOUT CONTRAST TECHNIQUE: Multiplanar, multiecho pulse sequences of the brain and surrounding structures were obtained without intravenous contrast. COMPARISON:  07/02/2017 CT head.  11/08/2016 MRI head. FINDINGS: Brain: Motion artifact on multiple sequences. No acute infarction, hemorrhage, hydrocephalus, extra-axial collection or mass lesion. Hippocampi are symmetric in size and signal. No structural abnormality of the brain is identified. Vascular: Normal flow voids. Skull and upper cervical spine: Normal marrow signal. Sinuses/Orbits: Negative. Other: None. IMPRESSION: No acute intracranial abnormality. Stable unremarkable MRI of the brain. Motion artifact on multiple sequences. Electronically Signed   By: Mitzi Hansen M.D.   On: 07/03/2017 04:21        Scheduled Meds: . amitriptyline  25 mg Oral QHS  . atorvastatin  80 mg Oral q1800  . enoxaparin (LOVENOX) injection  40 mg Subcutaneous Q24H  . fluticasone furoate-vilanterol  1 puff Inhalation Daily  . gabapentin  300 mg Oral TID  . insulin aspart  0-5 Units Subcutaneous QHS  . insulin aspart  0-9 Units  Subcutaneous TID WC  . insulin glargine  30 Units Subcutaneous QHS  . levETIRAcetam  750 mg Oral BID  . losartan  50 mg Oral Daily  . pantoprazole  40 mg Oral Daily  . sucralfate  1 g Oral TID WC & HS   Continuous Infusions: . sodium chloride 100 mL/hr at 07/02/17 2334     LOS: 0 days    Time spent: 40 minutes    WOODS, Roselind Messier, MD [facility] Pager [phone]   If 7PM-7AM, please contact night-coverage [email or web address] Password [facility] 07/03/2017, 8:27 AM

## 2017-07-03 NOTE — Progress Notes (Signed)
Pt c/o left sided chest pain about 20 minutes ago, described initially as sharp and pressure, now describes more as burning. ekg obtained, vss. NP made aware of the same. Administered maalox prn  And will reassess.

## 2017-07-03 NOTE — Progress Notes (Signed)
EEG Completed; Results Pending  

## 2017-07-03 NOTE — Procedures (Signed)
Date of recording 07/02/2017  Referring physician Timothy Opyd  Reason for the study 56 year old male with history of seizure disorder.  Technical This is a digital multichannel EEG recording using 10-20 international electrode system   Description of the recording The awake posterior dominant rhythm is 9 Hz symmetrical reactive and well sustained. During drowsiness mild generalized delta theta slowing was observed. Non-REM stage II sleep was not obtained. No localizing, lateralizing, epileptiform features were seen during this recording.  Impression The EEG is normal with patient recording awake and drowsy state only.

## 2017-07-04 DIAGNOSIS — N183 Chronic kidney disease, stage 3 (moderate): Secondary | ICD-10-CM

## 2017-07-04 DIAGNOSIS — I10 Essential (primary) hypertension: Secondary | ICD-10-CM | POA: Diagnosis not present

## 2017-07-04 DIAGNOSIS — G43109 Migraine with aura, not intractable, without status migrainosus: Secondary | ICD-10-CM | POA: Diagnosis not present

## 2017-07-04 DIAGNOSIS — R079 Chest pain, unspecified: Secondary | ICD-10-CM

## 2017-07-04 DIAGNOSIS — G40909 Epilepsy, unspecified, not intractable, without status epilepticus: Secondary | ICD-10-CM | POA: Diagnosis not present

## 2017-07-04 DIAGNOSIS — G40919 Epilepsy, unspecified, intractable, without status epilepticus: Secondary | ICD-10-CM | POA: Diagnosis not present

## 2017-07-04 LAB — GLUCOSE, CAPILLARY
Glucose-Capillary: 165 mg/dL — ABNORMAL HIGH (ref 65–99)
Glucose-Capillary: 198 mg/dL — ABNORMAL HIGH (ref 65–99)
Glucose-Capillary: 212 mg/dL — ABNORMAL HIGH (ref 65–99)
Glucose-Capillary: 322 mg/dL — ABNORMAL HIGH (ref 65–99)

## 2017-07-04 LAB — HEMOGLOBIN A1C
Hgb A1c MFr Bld: 9.7 % — ABNORMAL HIGH (ref 4.8–5.6)
Mean Plasma Glucose: 232 mg/dL

## 2017-07-04 LAB — TROPONIN I: Troponin I: <0.03 ng/mL (ref <0.03)

## 2017-07-04 LAB — MAGNESIUM: Magnesium: 1.9 mg/dL (ref 1.7–2.4)

## 2017-07-04 MED ORDER — INSULIN ASPART 100 UNIT/ML ~~LOC~~ SOLN
5.0000 [IU] | Freq: Three times a day (TID) | SUBCUTANEOUS | Status: DC
  Administered 2017-07-04 – 2017-07-05 (×4): 5 [IU] via SUBCUTANEOUS

## 2017-07-04 MED ORDER — AMITRIPTYLINE HCL 25 MG PO TABS
50.0000 mg | ORAL_TABLET | Freq: Every day | ORAL | Status: DC
  Administered 2017-07-04: 50 mg via ORAL
  Filled 2017-07-04: qty 2

## 2017-07-04 MED ORDER — INSULIN GLARGINE 100 UNIT/ML ~~LOC~~ SOLN
35.0000 [IU] | Freq: Every day | SUBCUTANEOUS | Status: DC
  Administered 2017-07-04: 35 [IU] via SUBCUTANEOUS
  Filled 2017-07-04 (×2): qty 0.35

## 2017-07-04 NOTE — Progress Notes (Signed)
PROGRESS NOTE    Nicholas [name]  ZOX:[id number] DOB: 1961-02-06 DOA: 07/02/2017 PCP: System, Pcp Not In   Brief Narrative:  56 y.o. BM PMHx DM type II on insulin, Bipolar Disorder, HTN, Seizures, Epilepsy, grand mal, Hyperthyroidism (not on medication per chart). S/P effusion C-spine 2 last surgery 2016  Presenting to the emergency department for evaluation of headaches and hyperglycemia. Patient reports that he been in his usual state of health, but working different shifts and suffering a lack of sufficient sleep. He reports consuming energy drinks to combat his fatigue, but explains that this has resulted in poor control of his blood sugars recently. He reports developing headaches, similar to his migraines, but unrelenting for the past day and a half. He denies any associated vision or hearing change, denies any recent fall or trauma, denies any recent use of alcohol or illicit substances. Denies fevers or chills.   ED Course: Upon arrival to the ED, patient is found to be afebrile, saturating well on room air, and with vitals otherwise stable. The patient had a witnessed generalized seizure in the ED waiting area. He was sent for a noncontrast CT of the head and cervical spine with no skull fracture or hemorrhage, and no acute cervical spine injury identified. EKG feature to sinus rhythm. Chemistry panels notable for serum creatinine 1.33 which appears consistent with his baseline. CBC is notable for very mild and stable normocytic anemia with hemoglobin of 12.6. Ethanol level was undetectable. Patient was given an IVP of Ativan x2 in ED for generalized seizure and was loaded with a gram of Keppra. He remained hemodynamically stable in the ED, in no apparent respiratory distress, and has been accepted in [facility] where he will be admitted for ongoing evaluation and management of breakthrough seizures.   Subjective: 7/29  A/O 4, negative CP, negative SOB, negative N/V.  States last night had episode of shooting chest pain which she reported to the nurse. Chest pain started approximately 2 weeks ago radiates down left arm. Currently chest pain-free also describes episodes of losing ability to flex and extend arm and hand. Negative seizures overnight    Assessment & Plan:   Principal Problem:   Seizure disorder (HCC) Active Problems:   Insulin-requiring or dependent type II diabetes mellitus (HCC)   CKD (chronic kidney disease), stage II   Normocytic anemia   Essential hypertension   Breakthrough Seizures  -Seizures do not appear to be well controlled as patient having them ~1 time per month - Pt had witnessed generalized seizures in the ED  - CT head/neck nondiagnostic for cause of seizure. See results below -MRI brain nondiagnostic cause seizures see results below -7/28 EEG: Normal see results below: If no seizures overnight phone consult neurology 7/30 for clearance to discharge patient -Continue Keppra 750 mg BID (increased from 500 mg BID)  -Continue seizure protocol  -Will consult Neurology in A.m. after workup complete  Migraine -Patient on Maxalt at home for migraines however must use with caution in patient with seizures.  -Sumatriptan 6 mg subcutaneous 1 in order to break headache. -7/29 Increase Amitriptyline to 50 mg QHS  Chest pain/left shoulder pain -Most likely secondary to C-spine fusion however patient has multiple risk factors cycle troponins. -7/29 EKG nonspecific ST-T wave changes leads III, aVF -Echocardiogram pending  DM Type 2 Uncontrolled with complications  -7/28 Hemoglobin A1c= 9.7 -Lipid panel within ADA guidelines - Managed at home with Lantus 35 units qHS and Humalog TID per sliding-scale  -  Check CBG with meals and qHS  - 7/29 increase Lantus 35 units QHS  -7/29 start NovoLog 5 units QAC -Sensitive SSI      CKD stage II (baseline Cr 1.33) Lab Results  Component Value Date   CREATININE 1.16 07/03/2017    CREATININE 1.33 (H) 07/02/2017   CREATININE 1.48 (H) 06/06/2017  -At baseline  Anemia  - Hgb is 12.6 on admission, stable relative to priors  - No bleeding evident, likely secondary to CKD   Essential Hypertension - BP at goal - Continue losartan    DVT prophylaxis: Lovenox Code Status: Full Family Communication: None Disposition Plan: If initial cardiac workup, and no overnight seizures DC 7/30   Consultants:  None    Procedures/Significant Events:  7/27 CT C-spine/head:-Negative fracture or hemorrhage--negative C-spine fracture or traumatic subluxation -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 7/28 MRI brain: Nondiagnostic 7/28 EEG- normal with patient recording awake and drowsy state only    VENTILATOR SETTINGS: None   Cultures None  Antimicrobials: Anti-infectives    None       Devices    LINES / TUBES:      Continuous Infusions: . sodium chloride 75 mL/hr (07/03/17 1814)     Objective: Vitals:   07/04/17 0844 07/04/17 1023 07/04/17 1440 07/04/17 1829  BP:  126/79 128/78   Pulse:  77 74 71  Resp:  20 20 20   Temp:  99 F (37.2 C) 98.7 F (37.1 C) 98.1 F (36.7 C)  TempSrc:  Oral Oral Oral  SpO2: 93% 95% 97% 97%  Weight:      Height:        Intake/Output Summary (Last 24 hours) at 07/04/17 1902 Last data filed at 07/04/17 0828  Gross per 24 hour  Intake              835 ml  Output             1100 ml  Net             -265 ml   Filed Weights   07/02/17 2250  Weight: 210 lb 15.7 oz (95.7 kg)    Examination:  Physical Exam: Vitals:   07/04/17 0844 07/04/17 1023 07/04/17 1440 07/04/17 1829  BP:  126/79 128/78   Pulse:  77 74 71  Resp:  20 20 20   Temp:  99 F (37.2 C) 98.7 F (37.1 C) 98.1 F (36.7 C)  TempSrc:  Oral Oral Oral  SpO2: 93% 95% 97% 97%  Weight:      Height:        Wt Readings from Last 3 Encounters:  07/02/17 210 lb 15.7 oz (95.7 kg)  06/08/17 213 lb (96.6 kg)  06/04/17 213 lb (96.6 kg)     General: A/O 4, No acute respiratory distress Neck:  Negative scars, masses, torticollis, lymphadenopathy, JVD Lungs: Clear to auscultation bilaterally without wheezes or crackles Cardiovascular: Regular rate and rhythm without murmur gallop or rub normal S1 and S2 Abdomen: negative abdominal pain, nondistended, positive soft, bowel sounds, no rebound, no ascites, no appreciable mass Extremities: No significant cyanosis, clubbing, or edema bilateral lower extremities Psychiatric:  Negative depression, negative anxiety, negative fatigue, negative mania  Central nervous system:  Cranial nerves II through XII intact, tongue/uvula midline, all extremities muscle strength 5/5, sensation intact throughout,  negative dysarthria, negative expressive aphasia, negative receptive aphasia.    .     Data Reviewed: Care during the described time interval was provided by me .  I  have reviewed this patient's available data, including medical history, events of note, physical examination, and all test results as part of my evaluation. I have personally reviewed and interpreted all radiology studies.  CBC:  Recent Labs Lab 07/02/17 1656 07/03/17 0249  WBC 4.4 4.8  NEUTROABS 2.3  --   HGB 12.6* 11.7*  HCT 37.5* 35.8*  MCV 83.3 83.6  PLT 155 151   Basic Metabolic Panel:  Recent Labs Lab 07/02/17 1656 07/03/17 0249 07/04/17 0314  NA 140 139  --   K 3.7 3.8  --   CL 106 108  --   CO2 25 25  --   GLUCOSE 167* 134*  --   BUN 10 7  --   CREATININE 1.33* 1.16  --   CALCIUM 9.2 8.7*  --   MG  --   --  1.9   GFR: Estimated Creatinine Clearance: 79.3 mL/min (by C-G formula based on SCr of 1.16 mg/dL). Liver Function Tests: No results for input(s): AST, ALT, ALKPHOS, BILITOT, PROT, ALBUMIN in the last 168 hours. No results for input(s): LIPASE, AMYLASE in the last 168 hours. No results for input(s): AMMONIA in the last 168 hours. Coagulation Profile: No results for input(s): INR,  PROTIME in the last 168 hours. Cardiac Enzymes:  Recent Labs Lab 07/03/17 2100  TROPONINI <0.03   BNP (last 3 results) No results for input(s): PROBNP in the last 8760 hours. HbA1C:  Recent Labs  07/03/17 1511  HGBA1C 9.7*   CBG:  Recent Labs Lab 07/03/17 1629 07/03/17 2106 07/04/17 0615 07/04/17 1147 07/04/17 1647  GLUCAP 166* 154* 165* 198* 212*   Lipid Profile:  Recent Labs  07/03/17 1511  CHOL 143  HDL 29*  LDLCALC 60  TRIG 409270*  CHOLHDL 4.9   Thyroid Function Tests: No results for input(s): TSH, T4TOTAL, FREET4, T3FREE, THYROIDAB in the last 72 hours. Anemia Panel: No results for input(s): VITAMINB12, FOLATE, FERRITIN, TIBC, IRON, RETICCTPCT in the last 72 hours. Urine analysis:    Component Value Date/Time   COLORURINE YELLOW 06/06/2017 1140   APPEARANCEUR CLEAR 06/06/2017 1140   LABSPEC 1.019 06/06/2017 1140   PHURINE 5.5 06/06/2017 1140   GLUCOSEU >=500 (A) 06/06/2017 1140   HGBUR NEGATIVE 06/06/2017 1140   BILIRUBINUR NEGATIVE 06/06/2017 1140   KETONESUR NEGATIVE 06/06/2017 1140   PROTEINUR NEGATIVE 06/06/2017 1140   UROBILINOGEN 0.2 08/29/2015 1125   NITRITE NEGATIVE 06/06/2017 1140   LEUKOCYTESUR NEGATIVE 06/06/2017 1140   Sepsis Labs: @LABRCNTIP (procalcitonin:4,lacticidven:4)  ) Recent Results (from the past 240 hour(s))  MRSA PCR Screening     Status: None   Collection Time: 07/02/17 11:54 PM  Result Value Ref Range Status   MRSA by PCR NEGATIVE NEGATIVE Final    Comment:        The GeneXpert MRSA Assay (FDA approved for NASAL specimens only), is one component of a comprehensive MRSA colonization surveillance program. It is not intended to diagnose MRSA infection nor to guide or monitor treatment for MRSA infections.          Radiology Studies: Mr Brain Wo Contrast  Result Date: 07/03/2017 CLINICAL DATA:  56 y/o  M; breakthrough seizure. EXAM: MRI HEAD WITHOUT CONTRAST TECHNIQUE: Multiplanar, multiecho pulse  sequences of the brain and surrounding structures were obtained without intravenous contrast. COMPARISON:  07/02/2017 CT head.  11/08/2016 MRI head. FINDINGS: Brain: Motion artifact on multiple sequences. No acute infarction, hemorrhage, hydrocephalus, extra-axial collection or mass lesion. Hippocampi are symmetric in size and signal. No structural abnormality  of the brain is identified. Vascular: Normal flow voids. Skull and upper cervical spine: Normal marrow signal. Sinuses/Orbits: Negative. Other: None. IMPRESSION: No acute intracranial abnormality. Stable unremarkable MRI of the brain. Motion artifact on multiple sequences. Electronically Signed   By: Mitzi Hansen M.D.   On: 07/03/2017 04:21        Scheduled Meds: . amitriptyline  35 mg Oral QHS  . atorvastatin  80 mg Oral q1800  . enoxaparin (LOVENOX) injection  40 mg Subcutaneous Q24H  . fluticasone furoate-vilanterol  1 puff Inhalation Daily  . folic acid  1 mg Oral Daily  . gabapentin  300 mg Oral TID  . insulin aspart  0-5 Units Subcutaneous QHS  . insulin aspart  0-9 Units Subcutaneous TID WC  . insulin glargine  30 Units Subcutaneous QHS  . levETIRAcetam  750 mg Oral BID  . losartan  50 mg Oral Daily  . pantoprazole  40 mg Oral Daily  . sucralfate  1 g Oral TID WC & HS  . thiamine  100 mg Oral Daily   Continuous Infusions: . sodium chloride 75 mL/hr (07/03/17 1814)     LOS: 0 days    Time spent: 40 minutes    [place] Giammona, Roselind Messier, MD [facility] Pager [phone]   If 7PM-7AM, please contact night-coverage [email or web address] Password [email or web address] 07/04/2017, 7:02 PM

## 2017-07-05 ENCOUNTER — Observation Stay (HOSPITAL_BASED_OUTPATIENT_CLINIC_OR_DEPARTMENT_OTHER): Payer: Medicare Other

## 2017-07-05 DIAGNOSIS — Z794 Long term (current) use of insulin: Secondary | ICD-10-CM | POA: Diagnosis not present

## 2017-07-05 DIAGNOSIS — E119 Type 2 diabetes mellitus without complications: Secondary | ICD-10-CM | POA: Diagnosis not present

## 2017-07-05 DIAGNOSIS — I10 Essential (primary) hypertension: Secondary | ICD-10-CM | POA: Diagnosis not present

## 2017-07-05 DIAGNOSIS — G40909 Epilepsy, unspecified, not intractable, without status epilepticus: Secondary | ICD-10-CM | POA: Diagnosis not present

## 2017-07-05 DIAGNOSIS — R079 Chest pain, unspecified: Secondary | ICD-10-CM | POA: Diagnosis not present

## 2017-07-05 LAB — ECHOCARDIOGRAM COMPLETE
E decel time: 243 msec
E/e' ratio: 7.01
FS: 33 % (ref 28–44)
Height: 67 in
IVS/LV PW RATIO, ED: 1.02
LA ID, A-P, ES: 33 mm
LA diam end sys: 33 mm
LA diam index: 1.59 cm/m2
LA vol A4C: 35.4 ml
LA vol index: 25.1 mL/m2
LA vol: 51.9 mL
LV E/e' medial: 7.01
LV E/e'average: 7.01
LV PW d: 12.4 mm — AB (ref 0.6–1.1)
LV e' LATERAL: 8.16 cm/s
LVOT SV: 103 mL
LVOT VTI: 29.9 cm
LVOT area: 3.46 cm2
LVOT diameter: 21 mm
LVOT peak grad rest: 5 mmHg
LVOT peak vel: 115 cm/s
Lateral S' vel: 13.8 cm/s
MV Dec: 243
MV pk A vel: 69.8 m/s
MV pk E vel: 57.2 m/s
TAPSE: 21.4 mm
TDI e' lateral: 8.16
TDI e' medial: 6.42
Weight: 3375.68 oz

## 2017-07-05 LAB — BASIC METABOLIC PANEL
Anion gap: 6 (ref 5–15)
BUN: 13 mg/dL (ref 6–20)
CO2: 26 mmol/L (ref 22–32)
Calcium: 9.6 mg/dL (ref 8.9–10.3)
Chloride: 108 mmol/L (ref 101–111)
Creatinine, Ser: 1.24 mg/dL (ref 0.61–1.24)
GFR calc Af Amer: >60 mL/min (ref >60)
GFR calc non Af Amer: >60 mL/min (ref >60)
Glucose, Bld: 270 mg/dL — ABNORMAL HIGH (ref 65–99)
Potassium: 4 mmol/L (ref 3.5–5.1)
Sodium: 140 mmol/L (ref 135–145)

## 2017-07-05 LAB — TROPONIN I
Troponin I: <0.03 ng/mL (ref <0.03)
Troponin I: <0.03 ng/mL (ref <0.03)

## 2017-07-05 LAB — GLUCOSE, CAPILLARY
Glucose-Capillary: 259 mg/dL — ABNORMAL HIGH (ref 65–99)
Glucose-Capillary: 313 mg/dL — ABNORMAL HIGH (ref 65–99)
Glucose-Capillary: 270 mg/dL — ABNORMAL HIGH (ref 65–99)
Glucose-Capillary: 304 mg/dL — ABNORMAL HIGH (ref 65–99)

## 2017-07-05 LAB — MAGNESIUM: Magnesium: 1.7 mg/dL (ref 1.7–2.4)

## 2017-07-05 MED ORDER — LEVETIRACETAM 750 MG PO TABS: 750.0000 mg | ORAL_TABLET | Freq: Two times a day (BID) | ORAL | 0 refills | Status: DC

## 2017-07-05 NOTE — Progress Notes (Signed)
  Echocardiogram 2D Echocardiogram has been performed.  Nicholas [name], Nicholas [name] 07/05/2017, 9:09 AM

## 2017-07-05 NOTE — Discharge Summary (Signed)
DISCHARGE SUMMARY  Nicholas [name]  MR#: [id number]  DOB:1961/05/27  Date of Admission: 07/02/2017 Date of Discharge: 07/05/2017  Attending Physician:Jacquelynne Guedes T  Patient's LFY:[id number], Pcp Not In  Consults:  none  Disposition: D/C home   Follow-up Appts: Follow-up Information    Your established Neurologist Follow up.   Why:  See your Neurologist (seizure doctor) within 5-7 days for a follow up appointment.         Your Primary Care Doctor Follow up.   Why:  See your Primary Care Doctor for a check of your diabetes control within 10-14 days.         Tests Needing Follow-up: -assessment of seizure control -assessment of DM control   Discharge Diagnoses: Breakthrough Seizures  Migraine DM2 Uncontrolled with complications  CKD stage II (baseline Cr 1.33) Anemia  Essential Hypertension  Initial presentation: 56 y.o.M Hx DM type II on insulin, Bipolar Disorder, HTN, Epilepsy, Hyperthyroidism, and fusion of  C-spine 2 (last surgery 2016) who presented to the ED c/o headaches and hyperglycemia. Patient reported he had been but working different shifts and suffering a lack of sleep. Upon arrival to the ED he was afebrile, saturating well on room air, and with vitals otherwise stable. The patient had a witnessed generalized seizure in the ED waiting area. He was sent for a noncontrast CT of the head and cervical spine with no skull fracture or hemorrhage, and no acute cervical spine injury identified. Patient was given IV Ativan x2 and wasloaded with a gram of Keppra.   Hospital Course:  Breakthrough Seizures  witnessed generalized seizure in the ED - CT head/neck and MRI brain w/o acute fidings - 7/28 EEG normal - Keppra increased from 500 BID to 750 mg BID - no further seizures encountered during his hospital stay - suspect this may have been brought on by sleep deprivation - discussed this w/ pt - pt is established w/ an outpt Neurologist w/ whom he intends to  follow up   Migraine on Maxalt at home for migraines - also likely related to sleep deprivation - HA improved but not resolved at time of d/c - to follow his usual tx measures at home - is able to eat and drink w/o difficulty at time of d/c   DM2 Uncontrolled with complications  W2H 9.7 - poor control likely mostly due to dietary noncompliance - DM educator met w/ pt and educated him on diet - pt instructed to avoid "energy drinks" or sodas that are typically high in sugar   CKD stage II (baseline Cr 1.33) Stable at baseline th/o the hospital stay   Anemia  Hgb 12.6 on admission - stable relative to priors   Essential Hypertension BP at goal - continue losartan   Allergies as of 07/05/2017      Reactions   Nsaids Other (See Comments)   D/t gastric ulcer   Tramadol Other (See Comments)   GI UPSET (PT HAS ULCER)   Amoxicillin Other (See Comments)   THRUSH   Ampicillin Other (See Comments)   THRUSH   Dilantin [phenytoin] Other (See Comments)   SEVERE SKIN FLAKING / PEELING   Penicillins Other (See Comments)   THRUSH   Risperidone And Related Other (See Comments)   hallucinations   Strawberry Extract Swelling   LIPS SWELL   Bactrim [sulfamethoxazole-trimethoprim] Hives   Depakote [divalproex Sodium]    Causes double vision and speech problems    Oatmeal Hives      Medication List  STOP taking these medications   ciprofloxacin 500 MG tablet Commonly known as:  CIPRO   doxycycline 100 MG capsule Commonly known as:  VIBRAMYCIN   HYDROcodone-acetaminophen 5-325 MG tablet Commonly known as:  NORCO/VICODIN   loperamide 2 MG tablet Commonly known as:  IMODIUM A-D   metroNIDAZOLE 500 MG tablet Commonly known as:  FLAGYL   omeprazole 20 MG capsule Commonly known as:  PRILOSEC   oseltamivir 75 MG capsule Commonly known as:  TAMIFLU   oxyCODONE-acetaminophen 5-325 MG tablet Commonly known as:  PERCOCET/ROXICET     TAKE these medications   amitriptyline 25  MG tablet Commonly known as:  ELAVIL Take 25 mg by mouth at bedtime.   atorvastatin 80 MG tablet Commonly known as:  LIPITOR Take 80 mg by mouth daily.   fluticasone-salmeterol 115-21 MCG/ACT inhaler Commonly known as:  ADVAIR HFA Inhale 2 puffs into the lungs daily.   gabapentin 300 MG capsule Commonly known as:  NEURONTIN Take 300 mg by mouth 2 (two) times daily.   insulin glargine 100 UNIT/ML injection Commonly known as:  LANTUS Inject 45 Units into the skin at bedtime.   insulin lispro 100 UNIT/ML injection Commonly known as:  HUMALOG Inject 6 Units into the skin 3 (three) times daily before meals. Sliding scale   levETIRAcetam 750 MG tablet Commonly known as:  KEPPRA Take 1 tablet (750 mg total) by mouth 2 (two) times daily. What changed:  Another medication with the same name was removed. Continue taking this medication, and follow the directions you see here.   losartan 50 MG tablet Commonly known as:  COZAAR Take 50 mg by mouth daily.   metFORMIN 500 MG tablet Commonly known as:  GLUCOPHAGE Take 1,000 mg by mouth 2 (two) times daily with a meal.   promethazine 25 MG tablet Commonly known as:  PHENERGAN Take 1 tablet (25 mg total) by mouth every 6 (six) hours as needed for nausea or vomiting.   rizatriptan 10 MG tablet Commonly known as:  MAXALT Take 10 mg by mouth as needed for migraine. May repeat in 2 hours if needed   sucralfate 1 GM/10ML suspension Commonly known as:  CARAFATE Take 10 mLs (1 g total) by mouth 4 (four) times daily -  with meals and at bedtime.       Day of Discharge BP 117/64 (BP Location: Right Arm)   Pulse (!) 54   Temp 97.8 F (36.6 C) (Oral)   Resp 20   Ht '5\' 7"'$  (1.702 m)   Wt 95.7 kg (210 lb 15.7 oz)   SpO2 93%   BMI 33.04 kg/m   Physical Exam: General: No acute respiratory distress Lungs: Clear to auscultation bilaterally without wheezes or crackles Cardiovascular: Regular rate and rhythm without murmur gallop or rub  normal S1 and S2 Abdomen: Nontender, nondistended, soft, bowel sounds positive, no rebound, no ascites, no appreciable mass Extremities: No significant cyanosis, clubbing, or edema bilateral lower extremities  Basic Metabolic Panel:  Recent Labs Lab 07/02/17 1656 07/03/17 0249 07/04/17 0314 07/05/17 0128  NA 140 139  --  140  K 3.7 3.8  --  4.0  CL 106 108  --  108  CO2 25 25  --  26  GLUCOSE 167* 134*  --  270*  BUN 10 7  --  13  CREATININE 1.33* 1.16  --  1.24  CALCIUM 9.2 8.7*  --  9.6  MG  --   --  1.9 1.7    CBC:  Recent Labs Lab 07/02/17 1656 07/03/17 0249  WBC 4.4 4.8  NEUTROABS 2.3  --   HGB 12.6* 11.7*  HCT 37.5* 35.8*  MCV 83.3 83.6  PLT 155 151    Cardiac Enzymes:  Recent Labs Lab 07/03/17 2100 07/04/17 2007 07/05/17 0128 07/05/17 0646  TROPONINI <0.03 <0.03 <0.03 <0.03    CBG:  Recent Labs Lab 07/04/17 1647 07/04/17 2051 07/05/17 0247 07/05/17 0639 07/05/17 1106  GLUCAP 212* 322* 259* 313* 270*    Recent Results (from the past 240 hour(s))  MRSA PCR Screening     Status: None   Collection Time: 07/02/17 11:54 PM  Result Value Ref Range Status   MRSA by PCR NEGATIVE NEGATIVE Final    Comment:        The GeneXpert MRSA Assay (FDA approved for NASAL specimens only), is one component of a comprehensive MRSA colonization surveillance program. It is not intended to diagnose MRSA infection nor to guide or monitor treatment for MRSA infections.      Time spent in discharge (includes decision making & examination of pt): 30 minutes  07/05/2017, 2:02 PM   Cherene Altes, MD [facility] Office  [phone] Pager [phone]  On-Call/Text Page:      [email or web address]      password [facility]

## 2017-07-05 NOTE — Discharge Instructions (Signed)
YOU SHOULD NOT DRIVE A CAR, OPERATE HEAVY MACHINERY, FLY AN AIRCRAFT, OR GO SWIMMING ALONE UNTIL YOU FOLLOW UP WITH YOUR NEUROLOGIST IN THE CLINIC AND ARE CLEARED TO DO SO.  AVOID ANY SITUATION IN WHICH A SEIZURE COULD LEAD TO YOUR ACCIDENTAL DEATH.    Epilepsy  Epilepsy is a condition in which a person has repeated seizures over time. A seizure is a sudden burst of abnormal electrical and chemical activity in the brain. Seizures can cause a change in attention, behavior, or the ability to remain awake and alert (altered mental status). Epilepsy increases a person's risk of falls, accidents, and injury. It can also lead to complications, including:  Depression.  Poor memory.  Sudden unexplained death in epilepsy (SUDEP). This complication is rare, and its cause is not known.  Most people with epilepsy lead normal lives. What are the causes? This condition may be caused by:  A head injury.  An injury that happens at birth.  A high fever during childhood.  A stroke.  Bleeding that goes into or around the brain.  Certain medicines and drugs.  Having too little oxygen for a long period of time.  Abnormal brain development.  Certain infections, such as meningitis and encephalitis.  Brain tumors.  Conditions that are passed along from parent to child (are hereditary).  What are the signs or symptoms? Symptoms of a seizure vary greatly from person to person. They include:  Convulsions.  Stiffening of the body.  Involuntary movements of the arms or legs.  Loss of consciousness.  Breathing problems.  Falling suddenly.  Confusion.  Head nodding.  Eye blinking or fluttering.  Lip smacking.  Drooling.  Rapid eye movements.  Grunting.  Loss of bladder control and bowel control.  Staring.  Unresponsiveness.  Some people have symptoms right before a seizure happens (aura) and right after a seizure happens. Symptoms of an aura include:  Fear or  anxiety.  Nausea.  Feeling like the room is spinning (vertigo).  A feeling of having seen or heard something before (deja vu).  Odd tastes or smells.  Changes in vision, such as seeing flashing lights or spots.  Symptoms that follow a seizure include:  Confusion.  Sleepiness.  Headache.  How is this diagnosed? This condition is diagnosed based on:  Your symptoms.  Your medical history.  A physical exam.  A neurological exam. A neurological exam is similar to a physical exam. It involves checking your strength, reflexes, coordination, and sensations.  Tests, such as: ? An electroencephalogram (EEG). This is a painless test that creates a diagram of your brain waves. ? An MRI of the brain. ? A CT scan of the brain. ? A lumbar puncture, also called a spinal tap. ? Blood tests to check for signs of infection or abnormal blood chemistry.  How is this treated? There is no cure for this condition, but treatment can help control seizures. Treatment may involve:  Taking medicines to control seizures. These include medicines to prevent seizures and medicines to stop seizures as they occur.  Having a device called a vagus nerve stimulator implanted in the chest. The device sends electrical impulses to the vagus nerve and to the brain to prevent seizures. This treatment may be recommended if medicines do not help.  Brain surgery. There are several kinds of surgeries that may be done to stop seizures from happening or to reduce how often seizures happen.  Having regular blood tests. You may need to have blood tests regularly  to check that you are getting the right amount of medicine.  Once this condition has been diagnosed, it is important to begin treatment as soon as possible. For some people, epilepsy eventually goes away. Follow these instructions at home: Medicines   Take over-the-counter and prescription medicines only as told by your health care provider.  Avoid any  substances that may prevent your medicine from working properly, such as alcohol. Activity  Get enough rest. Lack of sleep can make seizures more likely to occur.  Follow instructions from your health care provider about driving, swimming, and doing any other activities that would be dangerous if you had a seizure. Educating others Teach friends and family what to do if you have a seizure. They should:  Lay you on the ground to prevent a fall.  Cushion your head and body.  Loosen any tight clothing around your neck.  Turn you on your side. If vomiting occurs, this helps keep your airway clear.  Stay with you until you recover.  Not hold you down. Holding you down will not stop the seizure.  Not put anything in your mouth.  Know whether or not you need emergency care.  General instructions  Avoid anything that has ever triggered a seizure for you.  Keep a seizure diary. Record what you remember about each seizure, especially anything that might have triggered the seizure.  Keep all follow-up visits as told by your health care provider. This is important. Contact a health care provider if:  Your seizure pattern changes.  You have symptoms of infection or another illness. This might increase your risk of having a seizure. Get help right away if:  You have a seizure that does not stop after 5 minutes.  You have several seizures in a row without a complete recovery in between seizures.  You have a seizure that makes it harder to breathe.  You have a seizure that is different from previous seizures.  You have a seizure that leaves you unable to speak or use a part of your body.  You did not wake up immediately after a seizure. This information is not intended to replace advice given to you by your health care provider. Make sure you discuss any questions you have with your health care provider. Document Released: 11/23/2005 Document Revised: 06/20/2016 Document Reviewed:  06/02/2016 Elsevier Interactive Patient Education  [employer]2018 Elsevier Inc.

## 2017-07-05 NOTE — Progress Notes (Signed)
Inpatient Diabetes Program Recommendations  AACE/ADA: New Consensus Statement on Inpatient Glycemic Control (2015)  Target Ranges:  Prepandial:   less than 140 mg/dL      Peak postprandial:   less than 180 mg/dL (1-2 hours)      Critically ill patients:  140 - 180 mg/dL   Lab Results  Component Value Date   GLUCAP 270 (H) 07/05/2017   HGBA1C 9.7 (H) 07/03/2017    Review of Glycemic Control  Increased Lantus to 35 units QHS. HgbA1C of 9.7% indicates sub-par glycemic control PTA. Will need to f//u with PCP for diabetes control.  Agree with Lantus 35 units QHS, Novolog 0-9 units tidwc and hs + 5 units tidwc.  Will see pt prior to discharge regarding HgbA1C and importance of weight loss and exercise.   Follow.  Thank you. Nicholas [name], RD, LDN, CDE Inpatient Diabetes Coordinator [phone]

## 2017-07-05 NOTE — Care Management Note (Signed)
Case Management Note  Patient Details  Name: Nicholas [name] MRN: [id number] Date of Birth: 1961/12/03  Subjective/Objective:                    Action/Plan: CM consulted for assistance with medications. Pt has [employer] and Medicaid. Pt states the co pay for his medications has gone up in the last several months but according to him the most expensive was $15. CM encouraged him to contact his Medicaid CM and inquire about the change in his medication co pays. CM is not able to provide any further assistance with meds. The only medication that was modified is his Keppra. Pt states he has 500mg  and 250 mg tabs at home.  Consult also placed for homelessness. Pt states he was living in a shelter but his wife is going to let him stay with her until he gets into his apartment through the housing authority. Pt without PCP listed. Pt [facility]. No f/u and no DME needs per PT.  Pt has arranged for transportation to his wifes home.   Expected Discharge Date:  07/05/17               Expected Discharge Plan:  Home/Self Care  In-House Referral:     Discharge planning Services  CM Consult  Post Acute Care Choice:    Choice offered to:     DME Arranged:    DME Agency:     HH Arranged:    HH Agency:     Status of Service:  Completed, signed off  If discussed at [employer] Length of Stay Meetings, dates discussed:    Additional Comments:  Kermit BaloKelli F Kanda Deluna, RN 07/05/2017, 3:09 PM

## 2017-07-05 NOTE — Evaluation (Signed)
Physical Therapy Evaluation and Discharge Patient Details Name: Nicholas [name] MRN: [id number] DOB: Feb 24, 1961 Today's Date: 07/05/2017   History of Present Illness  Pt is a 56 y/o male admitted secondary to seizure and fall. CT of head and spine and MRI negative for acute abnormality. EEG study normal. PMH includes CKD, HTN, DM, bipolar disorder, seizures, asthma, and s/p ACDF of C4-6.   Clinical Impression  Patient evaluated by Physical Therapy with no further acute PT needs identified. All education has been completed and the patient has no further questions. Pt supervision for all mobility tasks. Scored 23 on DGI, indicating low fall risk. Pt reports wife will be able to assist as needed upon return home. See below for any follow-up Physical Therapy or equipment needs. PT is signing off. Thank you for this referral. If needs change, please re-consult.      Follow Up Recommendations No PT follow up    Equipment Recommendations  None recommended by PT    Recommendations for Other Services       Precautions / Restrictions Precautions Precautions: Fall;Other (comment) Precaution Comments: seizure precautions  Restrictions Weight Bearing Restrictions: No      Mobility  Bed Mobility               General bed mobility comments: In chair upon entry   Transfers Overall transfer level: Needs assistance Equipment used: None Transfers: Sit to/from Stand Sit to Stand: Supervision         General transfer comment: Supervision for safety   Ambulation/Gait Ambulation/Gait assistance: Supervision Ambulation Distance (Feet): 200 Feet Assistive device: None Gait Pattern/deviations: Step-through pattern;Decreased stride length Gait velocity: WNL Gait velocity interpretation: at or above normal speed for age/gender General Gait Details: Steady gait during dynamic gait tasks. No LOB noted.   Stairs Stairs: Yes Stairs assistance: Supervision Stair Management: No  rails;Alternating pattern;Forwards Number of Stairs: 4 General stair comments: steady and safe stair navigation using alternating pattern. Supervision for safety   Wheelchair Mobility    Modified Rankin (Stroke Patients Only)       Balance Overall balance assessment: Needs assistance Sitting-balance support: No upper extremity supported;Feet supported Sitting balance-Leahy Scale: Normal     Standing balance support: No upper extremity supported;During functional activity Standing balance-Leahy Scale: Good                   Standardized Balance Assessment Standardized Balance Assessment : Dynamic Gait Index   Dynamic Gait Index Level Surface: Normal Change in Gait Speed: Normal Gait with Horizontal Head Turns: Normal Gait with Vertical Head Turns: Normal Gait and Pivot Turn: Mild Impairment Step Over Obstacle: Normal Step Around Obstacles: Normal Steps: Normal Total Score: 23       Pertinent Vitals/Pain Pain Assessment: 0-10 Pain Score: 3  Pain Location: back and head  Pain Descriptors / Indicators: Headache;Sore Pain Intervention(s): Limited activity within patient's tolerance;Monitored during session;Repositioned    Home Living Family/patient expects to be discharged to:: Private residence Living Arrangements: Spouse/significant other Available Help at Discharge: Family;Available 24 hours/day Type of Home: House Home Access: Stairs to enter Entrance Stairs-Rails: None Entrance Stairs-Number of Steps: 1 (threshold ) Home Layout: One level Home Equipment: Walker - 2 wheels;Shower seat;Bedside commode      Prior Function Level of Independence: Independent         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 Has diabetic shoes     Cervical / Trunk Assessment Cervical / Trunk Assessment: Other exceptions Cervical / Trunk Exceptions: PMH of ACDF  Communication   Communication: No difficulties  Cognition Arousal/Alertness: Awake/alert Behavior During Therapy: WFL for tasks assessed/performed Overall Cognitive Status: Within Functional Limits for tasks assessed                                        General Comments General comments (skin integrity, edema, etc.): Discussed importance of wearing shoes during mobility secondary to neuropathy. Pt reports he has diabetic shoes that he wears when ambulating.     Exercises     Assessment/Plan    PT Assessment Patent does not need any further PT services  PT Problem List         PT Treatment Interventions      PT Goals (Current goals can be found in the Care Plan section)  Acute Rehab PT Goals Patient Stated Goal: to go home this afternoon  PT Goal Formulation: With patient Time For Goal Achievement: 07/05/17 Potential to Achieve Goals: Good    Frequency     Barriers to discharge        Co-evaluation               AM-PAC PT "6 Clicks" Daily Activity  Outcome Measure Difficulty turning over in bed (including adjusting bedclothes, sheets and blankets)?: None Difficulty moving from lying on back to sitting on the side of the bed? : None Difficulty sitting down on and standing up from a chair with arms (e.g., wheelchair, bedside commode, etc,.)?: None Help needed moving to and from a bed to chair (including a wheelchair)?: None Help needed walking in hospital room?: None Help needed climbing 3-5 steps with a railing? : None 6 Click Score: 24    End of Session Equipment Utilized During Treatment: Gait belt Activity Tolerance: Patient tolerated treatment well Patient left: in chair;with call bell/phone within reach;with chair alarm set Nurse  Communication: Mobility status PT Visit Diagnosis: Other symptoms and signs involving the nervous system (R29.898)    Time: 8119-14781231-1243 PT Time Calculation (min) (ACUTE ONLY): 12 min   Charges:   PT Evaluation $PT Eval Low Complexity: 1 Low     PT G Codes:   PT G-Codes **NOT FOR INPATIENT CLASS** Functional Assessment Tool Used: AM-PAC 6 Clicks Basic Mobility Functional Limitation: Mobility: Walking and moving around Mobility: Walking and Moving Around Current Status (G9562(G8978): 0 percent impaired, limited or restricted Mobility: Walking and Moving Around Goal Status (Z3086(G8979): 0 percent impaired, limited or restricted Mobility: Walking and Moving Around Discharge Status (V7846(G8980): 0 percent impaired, limited or restricted    Gladys DammeBrittany Sammy Douthitt, PT, DPT  Acute Rehabilitation Services  Pager: [phone]   Lehman PromBrittany S Shealee Yordy 07/05/2017, 12:56 PM

## 2017-07-05 NOTE — Progress Notes (Signed)
D/c reviewed with patient. Encouraged to adhere to prescribed meds and follow up with family practitioner and Neurologist as recommended.

## 2017-07-13 ENCOUNTER — Encounter (HOSPITAL_BASED_OUTPATIENT_CLINIC_OR_DEPARTMENT_OTHER): Payer: Self-pay | Admitting: *Deleted

## 2017-07-13 ENCOUNTER — Emergency Department (HOSPITAL_BASED_OUTPATIENT_CLINIC_OR_DEPARTMENT_OTHER)
Admission: EM | Admit: 2017-07-13 | Discharge: 2017-07-13 | Disposition: A | Discharge status: Home / Self Care | Payer: Medicare Other | Attending: Emergency Medicine | Admitting: Emergency Medicine

## 2017-07-13 DIAGNOSIS — I129 Hypertensive chronic kidney disease with stage 1 through stage 4 chronic kidney disease, or unspecified chronic kidney disease: Secondary | ICD-10-CM | POA: Diagnosis not present

## 2017-07-13 DIAGNOSIS — Z79899 Other long term (current) drug therapy: Secondary | ICD-10-CM | POA: Insufficient documentation

## 2017-07-13 DIAGNOSIS — J358 Other chronic diseases of tonsils and adenoids: Secondary | ICD-10-CM | POA: Insufficient documentation

## 2017-07-13 DIAGNOSIS — Z7984 Long term (current) use of oral hypoglycemic drugs: Secondary | ICD-10-CM | POA: Diagnosis not present

## 2017-07-13 DIAGNOSIS — Z87891 Personal history of nicotine dependence: Secondary | ICD-10-CM | POA: Insufficient documentation

## 2017-07-13 DIAGNOSIS — J029 Acute pharyngitis, unspecified: Secondary | ICD-10-CM

## 2017-07-13 DIAGNOSIS — N182 Chronic kidney disease, stage 2 (mild): Secondary | ICD-10-CM | POA: Diagnosis not present

## 2017-07-13 DIAGNOSIS — E1122 Type 2 diabetes mellitus with diabetic chronic kidney disease: Secondary | ICD-10-CM | POA: Diagnosis not present

## 2017-07-13 DIAGNOSIS — J45909 Unspecified asthma, uncomplicated: Secondary | ICD-10-CM | POA: Insufficient documentation

## 2017-07-13 MED ORDER — KETOROLAC TROMETHAMINE 60 MG/2ML IM SOLN
60.0000 mg | Freq: Once | INTRAMUSCULAR | Status: AC
  Administered 2017-07-13: 60 mg via INTRAMUSCULAR
  Filled 2017-07-13: qty 2

## 2017-07-13 NOTE — ED Triage Notes (Addendum)
Pt c/o mouth pain  X 1 month. See by ENT on 7/17 for same DX tonsil stones

## 2017-07-13 NOTE — Discharge Instructions (Signed)
Please continue to use over-the-counter pain medications, such as Tylenol as needed. Continue salt water gargling and rinses. Follow-up with the ear nose and throat specialist as soon as possible. Should symptoms worsen [name].

## 2017-07-13 NOTE — ED Provider Notes (Signed)
[facility] Provider Note   CSN: [id number] Arrival date & time: 07/13/17  1453     History   Chief Complaint Chief Complaint  Patient presents with  . Oral Pain    HPI Nicholas [name] is a 56 y.o. male.  HPI   [name] is a 56 y.o. male, with a history of Asthma, bipolar, epilepsy, HTN, and DM, presenting to the ED with Sore throat for the last 5 days. Patient endorses recurrent similar symptoms for the last few months. Pain is moderate, right-sided, sharp, nonradiating. He endorses spitting up small, white to yellow stones. He also endorses a scratchy feeling in his throat. He denies fever/chills, nausea/vomiting, difficulty breathing or swallowing, difficulty speaking, or any other complaints. He has not contacted his ENT specialist that his symptoms have recurred.   Patient has been previously evaluated by Dr. Verne Spurr at [facility] on 06/22/2017. He was diagnosed with tonsillar stones and given a follow-up for 3 months.   Past Medical History:  Diagnosis Date  . Asthma   . Bipolar 1 disorder (HCC)   . Borderline glaucoma   . Epididymal pain    LEFT  . 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 Feeling of incomplete bladder emptying   . Frequency of urination   . Gastric ulcer   . GERD (gastroesophageal reflux disease)   . Hypertension   . Hyperthyroidism    NO MEDS   . Migraine   . Seizures (HCC)   . Type 2 diabetes mellitus [facility])     Patient Active Problem List   Diagnosis Date Noted  . Essential hypertension   . Seizure disorder (HCC) 07/02/2017  . Insulin-requiring or dependent type II diabetes mellitus (HCC) 07/02/2017  . CKD (chronic kidney disease), stage II 07/02/2017  . Normocytic anemia 07/02/2017  . Testicular/scrotal pain 11/09/2013  . Microhematuria 11/09/2013  . Condyloma acuminatum of scrotum 11/09/2013    Past  Surgical History:  Procedure Laterality Date  . ANTERIOR CERVICAL DECOMP/DISCECTOMY FUSION  2007   C4  --  C6  . CERVICAL FUSION    . CHOLECYSTECTOMY    . CYSTOSCOPY N/A 11/09/2013   Procedure: CYSTOSCOPY FLEXIBLE;  Surgeon: Bjorn Pippin, MD;  Location: [facility];  Service: Urology;  Laterality: N/A;  . EPIDIDYMECTOMY Left 11/09/2013   Procedure:  LEFT EPIDIDYMECTOMY;  Surgeon: Bjorn Pippin, MD;  Location: [facility];  Service: Urology;  Laterality: Left;  POSSIBLE OUTPATIENT WITH OBSERVATION  . EXCISION LIPOMA LEFT SHOULDER  2004 (APPROX)  . MULTIPLE CYST REMOVED FROM CHEST  AGE 51  . OTHER SURGICAL HISTORY     hemorroid surgery   . TESTICLE REMOVAL Left        Home Medications    Prior to Admission medications   Medication Sig Start Date End Date Taking? Authorizing Provider  amitriptyline (ELAVIL) 25 MG tablet Take 25 mg by mouth at bedtime.    [provider]  atorvastatin (LIPITOR) 80 MG tablet Take 80 mg by mouth daily.    [provider]  fluticasone-salmeterol (ADVAIR HFA) 115-21 MCG/ACT inhaler Inhale 2 puffs into the lungs daily.    [provider]  gabapentin (NEURONTIN) 300 MG capsule Take 300 mg by mouth 2 (two) times daily.     [provider]  insulin glargine (LANTUS) 100 UNIT/ML injection Inject  45 Units into the skin at bedtime.     [provider]  insulin lispro (HUMALOG) 100 UNIT/ML injection Inject 6 Units into the skin 3 (three) times daily before meals. Sliding scale    [provider]  levETIRAcetam (KEPPRA) 750 MG tablet Take 1 tablet (750 mg total) by mouth 2 (two) times daily. 07/05/17   Lonia BloodMcClung, Jeffrey T, MD  losartan (COZAAR) 50 MG tablet Take 50 mg by mouth daily.    [provider]  metFORMIN (GLUCOPHAGE) 500 MG tablet Take 1,000 mg by mouth 2 (two) times daily with a meal.     [provider]  promethazine (PHENERGAN) 25 MG tablet Take 1 tablet (25 mg  total) by mouth every 6 (six) hours as needed for nausea or vomiting. 05/04/17   Vanetta MuldersZackowski, Scott, MD  rizatriptan (MAXALT) 10 MG tablet Take 10 mg by mouth as needed for migraine. May repeat in 2 hours if needed    [provider]  sucralfate (CARAFATE) 1 GM/10ML suspension Take 10 mLs (1 g total) by mouth 4 (four) times daily -  with meals and at bedtime. 06/09/17   Palumbo, April, MD    Family History History reviewed. No pertinent family history.  Social History Social History  Substance Use Topics  . Smoking status: Former Smoker    Packs/day: 0.25    Years: 15.00    Types: Cigarettes    Quit date: 11/08/1992  . Smokeless tobacco: Never Used  . Alcohol use No     Allergies   Nsaids; Tramadol; Amoxicillin; Ampicillin; Dilantin [phenytoin]; Penicillins; Risperidone and related; Strawberry extract; Bactrim [sulfamethoxazole-trimethoprim]; Depakote [divalproex sodium]; and Oatmeal   Review of Systems Review of Systems  Constitutional: Negative for chills and fever.  HENT: Positive for sore throat. Negative for drooling, ear pain and trouble swallowing.   Respiratory: Negative for shortness of breath.   Cardiovascular: Negative for chest pain.  All other systems reviewed and are negative.    Physical Exam Updated Vital Signs BP (!) 158/99   Pulse 100   Temp 98.6 F (37 C)   Resp 16   Ht 5\' 7"  (1.702 m)   Wt 93.4 kg (206 lb)   SpO2 99%   BMI 32.26 kg/m   Physical Exam  Constitutional: He appears well-developed and well-nourished. No distress.  HENT:  Head: Normocephalic and atraumatic.  Very minor erythema in posterior pharynx. No noted edema. Handling oral secretions without difficulty. No trismus. No noted facial swelling. No noted intraoral edema. Patient speaks without apparent difficulty or hesitation. Unable to express any tonsillar stones from right tonsil.   Eyes: Conjunctivae are normal.  Neck: Normal range of motion. Neck supple.  Cardiovascular:  Normal rate, regular rhythm, normal heart sounds and intact distal pulses.   Pulmonary/Chest: Effort normal and breath sounds normal. No respiratory distress.  Abdominal: Soft. There is no tenderness. There is no guarding.  Musculoskeletal: He exhibits no edema.  Lymphadenopathy:    He has no cervical adenopathy.  Neurological: He is alert.  Skin: Skin is warm and dry. He is not diaphoretic.  Psychiatric: He has a normal mood and affect. His behavior is normal.  Nursing note and vitals reviewed.    ED Treatments / Results  Labs (all labs ordered are listed, but only abnormal results are displayed) Labs Reviewed - No data to display  EKG  EKG Interpretation None       Radiology No results found.  Procedures Procedures (including critical care time)  Medications Ordered  in ED Medications  ketorolac (TORADOL) injection 60 mg (60 mg Intramuscular Given 07/13/17 1750)     Initial Impression / Assessment and Plan / ED Course  I have reviewed the triage vital signs and the nursing notes.  Pertinent labs & imaging results that were available during my care of the patient were reviewed by me and considered in my medical decision making (see chart for details).     Patient presents with a sore throat. Rapid strep negative. Patient's description sounds like inflammation surrounding tonsillar stones. He has no red flag symptoms. Patient is nontoxic appearing, afebrile, not tachycardic, not tachypneic, not hypotensive, maintains excellent SPO2 on room air, and is in no apparent distress. ENT follow up. The patient was given instructions for home care as well as return precautions. Patient voices understanding of these instructions, accepts the plan, and is comfortable with discharge.  Vitals:   07/13/17 1459 07/13/17 1501 07/13/17 1754  BP:  (!) 158/99 (!) 126/93  Pulse:  100 90  Resp:  16 16  Temp:  98.6 F (37 C)   SpO2:  99% 96%  Weight: 93.4 kg (206 lb)    Height: 5\' 7"   (1.702 m)       Final Clinical Impressions(s) / ED Diagnoses   Final diagnoses:  Pharyngitis, unspecified etiology  Tonsil stone    New Prescriptions Discharge Medication List as of 07/13/2017  6:02 PM       Anselm Pancoast, PA-C 07/15/17 1454    Alvira Monday, MD 07/17/17 2213

## 2017-07-13 NOTE — ED Notes (Signed)
Pt reports ongoing sore throat and hoarseness. States ENT told him he had tonsil stones and to follow up in 3 months.

## 2017-07-19 ENCOUNTER — Encounter (HOSPITAL_BASED_OUTPATIENT_CLINIC_OR_DEPARTMENT_OTHER): Payer: Self-pay | Admitting: *Deleted

## 2017-07-19 ENCOUNTER — Emergency Department (HOSPITAL_BASED_OUTPATIENT_CLINIC_OR_DEPARTMENT_OTHER)
Admission: EM | Admit: 2017-07-19 | Discharge: 2017-07-19 | Disposition: A | Discharge status: Home / Self Care | Payer: 59 | Attending: Emergency Medicine | Admitting: Emergency Medicine

## 2017-07-19 DIAGNOSIS — I129 Hypertensive chronic kidney disease with stage 1 through stage 4 chronic kidney disease, or unspecified chronic kidney disease: Secondary | ICD-10-CM | POA: Insufficient documentation

## 2017-07-19 DIAGNOSIS — Z87891 Personal history of nicotine dependence: Secondary | ICD-10-CM | POA: Insufficient documentation

## 2017-07-19 DIAGNOSIS — Z79899 Other long term (current) drug therapy: Secondary | ICD-10-CM | POA: Insufficient documentation

## 2017-07-19 DIAGNOSIS — E119 Type 2 diabetes mellitus without complications: Secondary | ICD-10-CM | POA: Diagnosis not present

## 2017-07-19 DIAGNOSIS — N182 Chronic kidney disease, stage 2 (mild): Secondary | ICD-10-CM | POA: Diagnosis not present

## 2017-07-19 DIAGNOSIS — J029 Acute pharyngitis, unspecified: Secondary | ICD-10-CM | POA: Insufficient documentation

## 2017-07-19 DIAGNOSIS — Z794 Long term (current) use of insulin: Secondary | ICD-10-CM | POA: Diagnosis not present

## 2017-07-19 DIAGNOSIS — J45909 Unspecified asthma, uncomplicated: Secondary | ICD-10-CM | POA: Insufficient documentation

## 2017-07-19 DIAGNOSIS — Z9089 Acquired absence of other organs: Secondary | ICD-10-CM

## 2017-07-19 NOTE — ED Notes (Signed)
ED Provider at bedside. 

## 2017-07-19 NOTE — Discharge Instructions (Signed)
Continue taking the pain medication as instructed.  Make sure you're taking plan fluids to stay hydrated.  Follow-up with the ENT as previously directed.  Return the emergency Department for any difficulty swallowing, difficulty breathing, vomiting, fevers, facial or neck swelling or redness or any other worsening or concerning symptoms.

## 2017-07-19 NOTE — ED Provider Notes (Signed)
[facility] Provider Note   CSN: [id number] Arrival date & time: 07/19/17  1007     History   Chief Complaint Chief Complaint  Patient presents with  . Sore Throat    HPI Nicholas [name] is a 56 y.o. male who is s/p tonsillectomy (performed by Dr. Christell ConstantMoore) 3 days ago who presents with sore throat. Patient reports that he has had some persistent throat pain after the tonsillectomy. He has been taking Hydocan with minimal relief of pain. Patient reports That he came to the emergency department today because he was concerned about the color in the back of his throat. Patient did not know if if the color was normal and was concerned probably ED visit. Patient has been able to swallow. He denies any facial swelling/redness.  The history is provided by the patient.    Past Medical History:  Diagnosis Date  . Asthma   . Bipolar 1 disorder (HCC)   . Borderline glaucoma   . Epididymal pain    LEFT  . 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 Feeling of incomplete bladder emptying   . Frequency of urination   . Gastric ulcer   . GERD (gastroesophageal reflux disease)   . Hypertension   . Hyperthyroidism    NO MEDS   . Migraine   . Seizures (HCC)   . Type 2 diabetes mellitus [facility](HCC)     Patient Active Problem List   Diagnosis Date Noted  . Essential hypertension   . Seizure disorder (HCC) 07/02/2017  . Insulin-requiring or dependent type II diabetes mellitus (HCC) 07/02/2017  . CKD (chronic kidney disease), stage II 07/02/2017  . Normocytic anemia 07/02/2017  . Testicular/scrotal pain 11/09/2013  . Microhematuria 11/09/2013  . Condyloma acuminatum of scrotum 11/09/2013    Past Surgical History:  Procedure Laterality Date  . ANTERIOR CERVICAL DECOMP/DISCECTOMY FUSION  2007   C4  --  C6  . CERVICAL FUSION    . CHOLECYSTECTOMY    . CYSTOSCOPY N/A 11/09/2013   Procedure:  CYSTOSCOPY FLEXIBLE;  Surgeon: Bjorn PippinJohn Wrenn, MD;  [facility];  Service: Urology;  Laterality: N/A;  . EPIDIDYMECTOMY Left 11/09/2013   Procedure:  LEFT EPIDIDYMECTOMY;  Surgeon: Bjorn PippinJohn Wrenn, MD;  [facility] [place];  Service: Urology;  Laterality: Left;  POSSIBLE OUTPATIENT WITH OBSERVATION  . EXCISION LIPOMA LEFT SHOULDER  2004 (APPROX)  . MULTIPLE CYST REMOVED FROM CHEST  AGE 5  . OTHER SURGICAL HISTORY     hemorroid surgery   . TESTICLE REMOVAL Left        Home Medications    Prior to Admission medications   Medication Sig Start Date End Date Taking? Authorizing Provider  amitriptyline (ELAVIL) 25 MG tablet Take 25 mg by mouth at bedtime.    [provider]  atorvastatin (LIPITOR) 80 MG tablet Take 80 mg by mouth daily.    [provider]  fluticasone-salmeterol (ADVAIR HFA) 115-21 MCG/ACT inhaler Inhale 2 puffs into the lungs daily.    [provider]  gabapentin (NEURONTIN) 300 MG capsule Take 300 mg by mouth 2 (two) times daily.     [provider]  insulin glargine (LANTUS) 100 UNIT/ML injection Inject 45 Units into the skin at bedtime.     [provider]  insulin lispro (HUMALOG) 100 UNIT/ML injection Inject 6 Units into the  skin 3 (three) times daily before meals. Sliding scale    [provider]  levETIRAcetam (KEPPRA) 750 MG tablet Take 1 tablet (750 mg total) by mouth 2 (two) times daily. 07/05/17   Lonia Blood, MD  losartan (COZAAR) 50 MG tablet Take 50 mg by mouth daily.    [provider]  metFORMIN (GLUCOPHAGE) 500 MG tablet Take 1,000 mg by mouth 2 (two) times daily with a meal.     [provider]  promethazine (PHENERGAN) 25 MG tablet Take 1 tablet (25 mg total) by mouth every 6 (six) hours as needed for nausea or vomiting. 05/04/17   Vanetta Mulders, MD  rizatriptan (MAXALT) 10 MG tablet Take 10 mg by mouth as needed for migraine. May repeat in 2 hours  if needed    [provider]  sucralfate (CARAFATE) 1 GM/10ML suspension Take 10 mLs (1 g total) by mouth 4 (four) times daily -  with meals and at bedtime. 06/09/17   Palumbo, April, MD    Family History History reviewed. No pertinent family history.  Social History Social History  Substance Use Topics  . Smoking status: Former Smoker    Packs/day: 0.25    Years: 15.00    Types: Cigarettes    Quit date: 11/08/1992  . Smokeless tobacco: Never Used  . Alcohol use No     Allergies   Nsaids; Tramadol; Amoxicillin; Ampicillin; Dilantin [phenytoin]; Penicillins; Risperidone and related; Strawberry extract; Bactrim [sulfamethoxazole-trimethoprim]; Depakote [divalproex sodium]; and Oatmeal   Review of Systems Review of Systems  Constitutional: Negative for chills and fever.  HENT: Positive for sore throat. Negative for facial swelling and trouble swallowing.   Respiratory: Negative for chest tightness.      Physical Exam Updated Vital Signs BP 140/90   Pulse 70   Temp 97.9 F (36.6 C) (Oral)   Resp 16   Ht 5\' 7"  (1.702 m)   Wt 93.4 kg (206 lb)   SpO2 99%   BMI 32.26 kg/m   Physical Exam  Constitutional: He appears well-developed and well-nourished.  HENT:  Head: Normocephalic and atraumatic.  Mouth/Throat: Uvula is midline.  Status post tonsillectomy bilaterally. Well scabbed lesions with mild surrounding erythema.  Eyes: Conjunctivae and EOM are normal. Right eye exhibits no discharge. Left eye exhibits no discharge. No scleral icterus.  Pulmonary/Chest: Effort normal.  No evidence of respiratory distress. Able to speak in full sentences without difficulty. Airways patent.  Neurological: He is alert.  Skin: Skin is warm and dry.  Psychiatric: He has a normal mood and affect. His speech is normal and behavior is normal.  Nursing note and vitals reviewed.    ED Treatments / Results  Labs (all labs ordered are listed, but only abnormal results are  displayed) Labs Reviewed - No data to display  EKG  EKG Interpretation None       Radiology No results found.  Procedures Procedures (including critical care time)  Medications Ordered in ED Medications - No data to display   Initial Impression / Assessment and Plan / ED Course  I have reviewed the triage vital signs and the nursing notes.  Pertinent labs & imaging results that were available during my care of the patient were reviewed by me and considered in my medical decision making (see chart for details).     56 year old male who is status post tonsillectomy 3 days presents to emergency Department with sore throat. Patient was concerned about the color of scabs and back throat. No  difficulty swallowing. He has been taking liquid pain medication in minimal relief. No fevers. Patient is afebrile, non-toxic appearing, sitting comfortably on examination table. Vital signs reviewed and stable. Physical exam shows well scabbed over lesions with minimal surrounding erythema. Airways pain. No evidence of respiratory distress. Consistent with post-tonsillectomy appearance. Patient instructed to continue taking his pain medication as instructed. Patient should follow up with ENT as previously scheduled. Strict return process discussed. Patient expresses understanding and agreement to plan.  Final Clinical Impressions(s) / ED Diagnoses   Final diagnoses:  Sore throat  S/P tonsillectomy    New Prescriptions Discharge Medication List as of 07/19/2017 10:50 AM       Maxwell Caul, PA-C 07/19/17 1726    Arby Barrette, MD 07/29/17 0981

## 2017-07-19 NOTE — ED Triage Notes (Signed)
Tonsillectomy x 3 days ago c/o throat pain

## 2017-08-06 ENCOUNTER — Emergency Department (HOSPITAL_BASED_OUTPATIENT_CLINIC_OR_DEPARTMENT_OTHER)
Admission: EM | Admit: 2017-08-06 | Discharge: 2017-08-06 | Disposition: A | Discharge status: Home / Self Care | Payer: Medicare Other | Attending: Emergency Medicine | Admitting: Emergency Medicine

## 2017-08-06 ENCOUNTER — Encounter (HOSPITAL_BASED_OUTPATIENT_CLINIC_OR_DEPARTMENT_OTHER): Payer: Self-pay | Admitting: *Deleted

## 2017-08-06 DIAGNOSIS — J029 Acute pharyngitis, unspecified: Secondary | ICD-10-CM | POA: Diagnosis not present

## 2017-08-06 DIAGNOSIS — N182 Chronic kidney disease, stage 2 (mild): Secondary | ICD-10-CM | POA: Insufficient documentation

## 2017-08-06 DIAGNOSIS — I129 Hypertensive chronic kidney disease with stage 1 through stage 4 chronic kidney disease, or unspecified chronic kidney disease: Secondary | ICD-10-CM | POA: Diagnosis not present

## 2017-08-06 DIAGNOSIS — E119 Type 2 diabetes mellitus without complications: Secondary | ICD-10-CM | POA: Diagnosis not present

## 2017-08-06 DIAGNOSIS — K4090 Unilateral inguinal hernia, without obstruction or gangrene, not specified as recurrent: Secondary | ICD-10-CM | POA: Insufficient documentation

## 2017-08-06 DIAGNOSIS — R1031 Right lower quadrant pain: Secondary | ICD-10-CM | POA: Diagnosis present

## 2017-08-06 DIAGNOSIS — Z87891 Personal history of nicotine dependence: Secondary | ICD-10-CM | POA: Insufficient documentation

## 2017-08-06 DIAGNOSIS — Z79899 Other long term (current) drug therapy: Secondary | ICD-10-CM | POA: Diagnosis not present

## 2017-08-06 DIAGNOSIS — J45909 Unspecified asthma, uncomplicated: Secondary | ICD-10-CM | POA: Diagnosis not present

## 2017-08-06 DIAGNOSIS — Z7984 Long term (current) use of oral hypoglycemic drugs: Secondary | ICD-10-CM | POA: Insufficient documentation

## 2017-08-06 LAB — URINALYSIS, MICROSCOPIC (REFLEX): WBC, UA: NONE SEEN WBC/hpf (ref 0–5)

## 2017-08-06 LAB — URINALYSIS, ROUTINE W REFLEX MICROSCOPIC
Bilirubin Urine: NEGATIVE
Glucose, UA: >=500 mg/dL — AB
Hgb urine dipstick: NEGATIVE
Ketones, ur: NEGATIVE mg/dL
Leukocytes, UA: NEGATIVE
Nitrite: NEGATIVE
Protein, ur: NEGATIVE mg/dL
Specific Gravity, Urine: 1.01 (ref 1.005–1.030)
pH: 6 (ref 5.0–8.0)

## 2017-08-06 MED ORDER — METHYLPREDNISOLONE 4 MG PO TBPK: ORAL_TABLET | ORAL | 0 refills | Status: DC

## 2017-08-06 MED ORDER — PREDNISONE 50 MG PO TABS
60.0000 mg | ORAL_TABLET | Freq: Once | ORAL | Status: AC
  Administered 2017-08-06: 60 mg via ORAL
  Filled 2017-08-06: qty 1

## 2017-08-06 MED ORDER — BENZONATATE 100 MG PO CAPS: 100.0000 mg | ORAL_CAPSULE | Freq: Three times a day (TID) | ORAL | 0 refills | Status: DC

## 2017-08-06 MED ORDER — PROMETHAZINE-DM 6.25-15 MG/5ML PO SYRP: 5.0000 mL | ORAL_SOLUTION | Freq: Four times a day (QID) | ORAL | 0 refills | Status: DC | PRN

## 2017-08-06 MED ORDER — HYDROCODONE-ACETAMINOPHEN 7.5-325 MG/15ML PO SOLN
15.0000 mL | Freq: Once | ORAL | Status: AC
  Administered 2017-08-06: 15 mL via ORAL
  Filled 2017-08-06: qty 15

## 2017-08-06 NOTE — Discharge Instructions (Signed)
You have been prescribed steroids for throat inflammation. This is going to make your sugar is higher for the next several days and you will have to use your sliding scale insulin.  You have a hernia in your right groin. If this remains painful, follow up with your primary care physician for a surgical referral.  Coughing makes hernia is more painful. You have been prescribed a daytime, and a nighttime cough suppressant

## 2017-08-06 NOTE — ED Provider Notes (Signed)
[facility]   CSN: [id number] Arrival date & time: 08/06/17  1934     History   Chief Complaint Chief Complaint  Patient presents with  . Groin Pain    HPI Nicholas [name] is a 56 y.o. male.She complains groin pain, cough and sore throat  HPI:  Nicholas [name] is a type II, now insulin-dependent diabetic. He had frequent pharyngitis underwent tonsillectomy 10 days ago. Had a cough. He continues to feel like he has "something in my throat". Now with cough he has pain in his right groin area hurts to stand of hurts to cough. No bulging of the groin. She was up and over the last few days. He does use sliding scale is low is been 200 in size been 450.  Past Medical History:  Diagnosis Date  . Asthma   . Bipolar 1 disorder (HCC)   . Borderline glaucoma   . Epididymal pain    LEFT  . 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 Feeling of incomplete bladder emptying   . Frequency of urination   . Gastric ulcer   . GERD (gastroesophageal reflux disease)   . Hypertension   . Hyperthyroidism    NO MEDS   . Migraine   . Seizures (HCC)   . Type 2 diabetes mellitus [facility])     Patient Active Problem List   Diagnosis Date Noted  . Essential hypertension   . Seizure disorder (HCC) 07/02/2017  . Insulin-requiring or dependent type II diabetes mellitus (HCC) 07/02/2017  . CKD (chronic kidney disease), stage II 07/02/2017  . Normocytic anemia 07/02/2017  . Testicular/scrotal pain 11/09/2013  . Microhematuria 11/09/2013  . Condyloma acuminatum of scrotum 11/09/2013    Past Surgical History:  Procedure Laterality Date  . ANTERIOR CERVICAL DECOMP/DISCECTOMY FUSION  2007   C4  --  C6  . CERVICAL FUSION    . CHOLECYSTECTOMY    . CYSTOSCOPY N/A 11/09/2013   Procedure: CYSTOSCOPY FLEXIBLE;  Surgeon: Bjorn Pippin, MD;  Location: [facility];  Service: Urology;   Laterality: N/A;  . EPIDIDYMECTOMY Left 11/09/2013   Procedure:  LEFT EPIDIDYMECTOMY;  Surgeon: Bjorn Pippin, MD;  [facility];  Service: Urology;  Laterality: Left;  POSSIBLE OUTPATIENT WITH OBSERVATION  . EXCISION LIPOMA LEFT SHOULDER  2004 (APPROX)  . MULTIPLE CYST REMOVED FROM CHEST  AGE 9  . OTHER SURGICAL HISTORY     hemorroid surgery   . TESTICLE REMOVAL Left        Home Medications    Prior to Admission medications   Medication Sig Start Date End Date Taking? Authorizing Provider  amitriptyline (ELAVIL) 25 MG tablet Take 25 mg by mouth at bedtime.    [provider]  atorvastatin (LIPITOR) 80 MG tablet Take 80 mg by mouth daily.    [provider]  benzonatate (TESSALON) 100 MG capsule Take 1 capsule (100 mg total) by mouth every 8 (eight) hours. 08/06/17   Rolland Porter, MD  fluticasone-salmeterol (ADVAIR HFA) [phone] MCG/ACT inhaler Inhale 2 puffs into the lungs daily.    [provider]  gabapentin (NEURONTIN) 300 MG capsule Take 300 mg by mouth 2 (two) times daily.     [provider]  insulin glargine (LANTUS) 100 UNIT/ML injection Inject 45 Units into the skin at bedtime.     [provider]  insulin lispro (HUMALOG) 100 UNIT/ML injection Inject 6 Units into the skin 3 (three) times daily before meals. Sliding scale    [provider]  levETIRAcetam (KEPPRA) 750 MG tablet Take 1 tablet (750 mg total) by mouth 2 (two) times daily. 07/05/17   Lonia BloodMcClung, Jeffrey T, MD  losartan (COZAAR) 50 MG tablet Take 50 mg by mouth daily.    [provider]  metFORMIN (GLUCOPHAGE) 500 MG tablet Take 1,000 mg by mouth 2 (two) times daily with a meal.     [provider]  methylPREDNISolone (MEDROL DOSEPAK) 4 MG TBPK tablet 6 po on day 1, decrease by 1 tab per day 08/06/17   Rolland PorterJames, Tiondra Fang, MD  promethazine (PHENERGAN) 25 MG tablet Take 1 tablet (25 mg total) by mouth every 6 (six) hours as needed for nausea  or vomiting. 05/04/17   Vanetta MuldersZackowski, Scott, MD  promethazine-dextromethorphan (PROMETHAZINE-DM) 6.25-15 MG/5ML syrup Take 5 mLs by mouth 4 (four) times daily as needed for cough. 08/06/17   Rolland PorterJames, Rylyn Zawistowski, MD  rizatriptan (MAXALT) 10 MG tablet Take 10 mg by mouth as needed for migraine. May repeat in 2 hours if needed    [provider]  sucralfate (CARAFATE) 1 GM/10ML suspension Take 10 mLs (1 g total) by mouth 4 (four) times daily -  with meals and at bedtime. 06/09/17   Palumbo, April, MD    Family History History reviewed. No pertinent family history.  Social History Social History  Substance Use Topics  . Smoking status: Former Smoker    Packs/day: 0.25    Years: 15.00    Types: Cigarettes    Quit date: 11/08/1992  . Smokeless tobacco: Never Used  . Alcohol use No     Allergies   Nsaids; Tramadol; Amoxicillin; Ampicillin; Dilantin [phenytoin]; Penicillins; Risperidone and related; Strawberry extract; Bactrim [sulfamethoxazole-trimethoprim]; Depakote [divalproex sodium]; and Oatmeal   Review of Systems Review of Systems  Constitutional: Negative for appetite change, chills, diaphoresis, fatigue and fever.  HENT: Positive for sore throat. Negative for mouth sores and trouble swallowing.   Eyes: Negative for visual disturbance.  Respiratory: Positive for cough. Negative for chest tightness, shortness of breath and wheezing.   Cardiovascular: Negative for chest pain.  Gastrointestinal: Negative for abdominal distention, abdominal pain, diarrhea, nausea and vomiting.       Groin pain  Endocrine: Positive for polyuria. Negative for polydipsia and polyphagia.  Genitourinary: Negative for dysuria, frequency and hematuria.  Musculoskeletal: Negative for gait problem.  Skin: Negative for color change, pallor and rash.  Neurological: Negative for dizziness, syncope, light-headedness and headaches.  Hematological: Does not bruise/bleed easily.  Psychiatric/Behavioral: Negative for  behavioral problems and confusion.     Physical Exam Updated Vital Signs BP (!) 140/94   Pulse (!) 102   Temp 97.8 F (36.6 C) (Oral)   Resp 16   Ht 5\' 7"  (1.702 m)   Wt 93.4 kg (206 lb)   SpO2 97%   BMI 32.26 kg/m   Physical Exam  Constitutional: He is oriented to person, place, and time. He appears well-developed and well-nourished. No distress.  HENT:  Head: Normocephalic.  Minimal edema at the tip of an otherwise small uvula. No exudate. Tonsillar pillars appear to be healed. No exudate. No bleeding. No adenopathy in the neck.  Eyes: Pupils are equal, round, and reactive to light. Conjunctivae are normal. No scleral icterus.  Neck: Normal range of motion. Neck supple. No thyromegaly present.  Cardiovascular: Normal rate and regular rhythm.  Exam reveals  no gallop and no friction rub.   No murmur heard. Pulmonary/Chest: Effort normal and breath sounds normal. No respiratory distress. He has no wheezes. He has no rales.  Abdominal: Soft. Bowel sounds are normal. He exhibits no distension. There is no tenderness. There is no rebound.  Direct inguinal hernial tenderness along his inner ligament. No adenopathy. No bulging to the scrotum. Slight bulging felt with Valsalva.  Musculoskeletal: Normal range of motion.  Neurological: He is alert and oriented to person, place, and time.  Skin: Skin is warm and dry. No rash noted.  Psychiatric: He has a normal mood and affect. His behavior is normal.     ED Treatments / Results  Labs (all labs ordered are listed, but only abnormal results are displayed) Labs Reviewed  URINALYSIS, ROUTINE W REFLEX MICROSCOPIC - Abnormal; Notable for the following:       Result Value   Glucose, UA >=500 (*)    All other components within normal limits  URINALYSIS, MICROSCOPIC (REFLEX) - Abnormal; Notable for the following:    Bacteria, UA RARE (*)    Squamous Epithelial / LPF 0-5 (*)    All other components within normal limits    EKG  EKG  Interpretation None       Radiology No results found.  Procedures Procedures (including critical care time)  Medications Ordered in ED Medications  predniSONE (DELTASONE) tablet 60 mg (60 mg Oral Given 08/06/17 2143)  HYDROcodone-acetaminophen (HYCET) 7.5-325 mg/15 ml solution 15 mL (15 mLs Oral Given 08/06/17 2143)     Initial Impression / Assessment and Plan / ED Course  I have reviewed the triage vital signs and the nursing notes.  Pertinent labs & imaging results that were available during my care of the patient were reviewed by me and considered in my medical decision making (see chart for details).    I discussed a short course of steroid for his throat. I stressed my concern about hyperglycemia. Preference would be to place IV. Check sugars. Control his sugar here. He politely declines. States he has been using sliding scale insulin for years and his white count with this. Plan will be short course of tapering Medrol. Cough control. Phenergan DM. Tessalon. Primary care of his hernia discomfort persists after cough control with any signs of strangulation.  Final Clinical Impressions(s) / ED Diagnoses   Final diagnoses:  Unilateral inguinal hernia without obstruction or gangrene, recurrence not specified  Pharyngitis, unspecified etiology    New Prescriptions Discharge Medication List as of 08/06/2017  9:44 PM    START taking these medications   Details  benzonatate (TESSALON) 100 MG capsule Take 1 capsule (100 mg total) by mouth every 8 (eight) hours., Starting Fri 08/06/2017, Print    methylPREDNISolone (MEDROL DOSEPAK) 4 MG TBPK tablet 6 po on day 1, decrease by 1 tab per day, Print    promethazine-dextromethorphan (PROMETHAZINE-DM) 6.25-15 MG/5ML syrup Take 5 mLs by mouth 4 (four) times daily as needed for cough., Starting Fri 08/06/2017, Print         Rolland Porter, MD 08/06/17 [phone]

## 2017-08-06 NOTE — ED Triage Notes (Signed)
Pt with multiple complaints including right groin pain feels like lump in throat decreased taste, oral pain elevated BS

## 2017-09-01 ENCOUNTER — Encounter (HOSPITAL_BASED_OUTPATIENT_CLINIC_OR_DEPARTMENT_OTHER): Payer: Self-pay | Admitting: Emergency Medicine

## 2017-09-01 ENCOUNTER — Emergency Department (HOSPITAL_BASED_OUTPATIENT_CLINIC_OR_DEPARTMENT_OTHER)
Admission: EM | Admit: 2017-09-01 | Discharge: 2017-09-01 | Disposition: A | Discharge status: Home / Self Care | Payer: Medicare Other | Attending: Emergency Medicine | Admitting: Emergency Medicine

## 2017-09-01 ENCOUNTER — Emergency Department (HOSPITAL_BASED_OUTPATIENT_CLINIC_OR_DEPARTMENT_OTHER): Payer: Medicare Other

## 2017-09-01 DIAGNOSIS — Z794 Long term (current) use of insulin: Secondary | ICD-10-CM | POA: Diagnosis not present

## 2017-09-01 DIAGNOSIS — G43811 Other migraine, intractable, with status migrainosus: Secondary | ICD-10-CM | POA: Diagnosis not present

## 2017-09-01 DIAGNOSIS — J45909 Unspecified asthma, uncomplicated: Secondary | ICD-10-CM | POA: Diagnosis not present

## 2017-09-01 DIAGNOSIS — E119 Type 2 diabetes mellitus without complications: Secondary | ICD-10-CM | POA: Insufficient documentation

## 2017-09-01 DIAGNOSIS — Z87891 Personal history of nicotine dependence: Secondary | ICD-10-CM | POA: Insufficient documentation

## 2017-09-01 DIAGNOSIS — Z79899 Other long term (current) drug therapy: Secondary | ICD-10-CM | POA: Insufficient documentation

## 2017-09-01 DIAGNOSIS — R569 Unspecified convulsions: Secondary | ICD-10-CM

## 2017-09-01 LAB — CBC WITH DIFFERENTIAL/PLATELET
Basophils Absolute: 0 10*3/uL (ref 0.0–0.1)
Basophils Relative: 0 %
Eosinophils Absolute: 0.1 10*3/uL (ref 0.0–0.7)
Eosinophils Relative: 1 %
HCT: 35.8 % — ABNORMAL LOW (ref 39.0–52.0)
Hemoglobin: 11.9 g/dL — ABNORMAL LOW (ref 13.0–17.0)
Lymphocytes Relative: 45 %
Lymphs Abs: 2.5 10*3/uL (ref 0.7–4.0)
MCH: 27.6 pg (ref 26.0–34.0)
MCHC: 33.2 g/dL (ref 30.0–36.0)
MCV: 83.1 fL (ref 78.0–100.0)
Monocytes Absolute: 0.5 10*3/uL (ref 0.1–1.0)
Monocytes Relative: 9 %
Neutro Abs: 2.4 10*3/uL (ref 1.7–7.7)
Neutrophils Relative %: 45 %
Platelets: 173 10*3/uL (ref 150–400)
RBC: 4.31 MIL/uL (ref 4.22–5.81)
RDW: 15 % (ref 11.5–15.5)
WBC: 5.5 10*3/uL (ref 4.0–10.5)

## 2017-09-01 LAB — COMPREHENSIVE METABOLIC PANEL
ALT: 45 U/L (ref 17–63)
AST: 25 U/L (ref 15–41)
Albumin: 3.8 g/dL (ref 3.5–5.0)
Alkaline Phosphatase: 42 U/L (ref 38–126)
Anion gap: 7 (ref 5–15)
BUN: 15 mg/dL (ref 6–20)
CO2: 25 mmol/L (ref 22–32)
Calcium: 9.6 mg/dL (ref 8.9–10.3)
Chloride: 110 mmol/L (ref 101–111)
Creatinine, Ser: 1.12 mg/dL (ref 0.61–1.24)
GFR calc Af Amer: >60 mL/min (ref >60)
GFR calc non Af Amer: >60 mL/min (ref >60)
Glucose, Bld: 127 mg/dL — ABNORMAL HIGH (ref 65–99)
Potassium: 3.3 mmol/L — ABNORMAL LOW (ref 3.5–5.1)
Sodium: 142 mmol/L (ref 135–145)
Total Bilirubin: 0.5 mg/dL (ref 0.3–1.2)
Total Protein: 6.5 g/dL (ref 6.5–8.1)

## 2017-09-01 LAB — ETHANOL: Alcohol, Ethyl (B): <10 mg/dL (ref <10)

## 2017-09-01 LAB — CBG MONITORING, ED: Glucose-Capillary: 136 mg/dL — ABNORMAL HIGH (ref 65–99)

## 2017-09-01 LAB — I-STAT CG4 LACTIC ACID, ED: Lactic Acid, Venous: 1.19 mmol/L (ref 0.5–1.9)

## 2017-09-01 MED ORDER — METOCLOPRAMIDE HCL 5 MG/ML IJ SOLN
10.0000 mg | Freq: Once | INTRAMUSCULAR | Status: AC
  Administered 2017-09-01: 10 mg via INTRAVENOUS
  Filled 2017-09-01: qty 2

## 2017-09-01 MED ORDER — DEXAMETHASONE SODIUM PHOSPHATE 10 MG/ML IJ SOLN
10.0000 mg | Freq: Once | INTRAMUSCULAR | Status: AC
  Administered 2017-09-01: 10 mg via INTRAVENOUS
  Filled 2017-09-01: qty 1

## 2017-09-01 MED ORDER — HYDROMORPHONE HCL 1 MG/ML IJ SOLN
1.0000 mg | Freq: Once | INTRAMUSCULAR | Status: AC
  Administered 2017-09-01: 1 mg via INTRAVENOUS
  Filled 2017-09-01: qty 1

## 2017-09-01 MED ORDER — SODIUM CHLORIDE 0.9 % IV BOLUS (SEPSIS)
500.0000 mL | Freq: Once | INTRAVENOUS | Status: AC
  Administered 2017-09-01: 500 mL via INTRAVENOUS

## 2017-09-01 MED ORDER — LORAZEPAM 2 MG/ML IJ SOLN
1.0000 mg | Freq: Once | INTRAMUSCULAR | Status: AC
Start: 2017-09-01 — End: 2017-09-01
  Administered 2017-09-01: 1 mg via INTRAVENOUS
  Filled 2017-09-01: qty 1

## 2017-09-01 MED ORDER — SODIUM CHLORIDE 0.9 % IV SOLN
INTRAVENOUS | Status: DC
  Administered 2017-09-01: 15:00:00 via INTRAVENOUS

## 2017-09-01 MED ORDER — DIPHENHYDRAMINE HCL 50 MG/ML IJ SOLN
25.0000 mg | Freq: Once | INTRAMUSCULAR | Status: AC
  Administered 2017-09-01: 25 mg via INTRAVENOUS
  Filled 2017-09-01: qty 1

## 2017-09-01 MED ORDER — POTASSIUM CHLORIDE CRYS ER 20 MEQ PO TBCR
40.0000 meq | EXTENDED_RELEASE_TABLET | Freq: Once | ORAL | Status: AC
  Administered 2017-09-01: 40 meq via ORAL
  Filled 2017-09-01: qty 2

## 2017-09-01 MED ORDER — ONDANSETRON HCL 4 MG/2ML IJ SOLN
4.0000 mg | Freq: Once | INTRAMUSCULAR | Status: AC
  Administered 2017-09-01: 4 mg via INTRAVENOUS
  Filled 2017-09-01: qty 2

## 2017-09-01 NOTE — ED Provider Notes (Signed)
[facility] Provider Note   CSN: [id number] Arrival date & time: 09/01/17  1431     History   Chief Complaint Chief Complaint  Patient presents with  . Seizures    HPI Nicholas [name] is a 56 y.o. male.  Patient brought in by POV. Patient's wife stated that patient started seizing about 10 minutes in the car. Seemed to be post ictal upon arrival here. Patient had the complaint of a headache for a number of days. Patient states he was seen for the headache at [facility] last night but there are notes seem to imply that he was seen for seizure. Patient's head continues to hurt. He also does have a history of migraines. The seizure witnessed by his wife was very similar to his typical seizure activity. Not clear whether the head pain is typical. Patient talks about the head hurting all over. Patient is from the [place] area. Followed by neurology there. Patient had a recent increase in his seizure meds. Patient's on Keppra. Patient also has a history of bipolar disorder. Apparently patient has not been sick other than the fact that the head is been hurting for several days. No other systemic symptoms.      Past Medical History:  Diagnosis Date  . Asthma   . Bipolar 1 disorder (HCC)   . Borderline glaucoma   . Epididymal pain    LEFT  . 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 Feeling of incomplete bladder emptying   . Frequency of urination   . Gastric ulcer   . GERD (gastroesophageal reflux disease)   . Hypertension   . Hyperthyroidism    NO MEDS   . Migraine   . Seizures (HCC)   . Type 2 diabetes mellitus [facility])     Patient Active Problem List   Diagnosis Date Noted  . Essential hypertension   . Seizure disorder (HCC) 07/02/2017  . Insulin-requiring or dependent type II diabetes mellitus (HCC) 07/02/2017  . CKD (chronic kidney disease), stage II  07/02/2017  . Normocytic anemia 07/02/2017  . Testicular/scrotal pain 11/09/2013  . Microhematuria 11/09/2013  . Condyloma acuminatum of scrotum 11/09/2013    Past Surgical History:  Procedure Laterality Date  . ANTERIOR CERVICAL DECOMP/DISCECTOMY FUSION  2007   C4  --  C6  . CERVICAL FUSION    . CHOLECYSTECTOMY    . CYSTOSCOPY N/A 11/09/2013   Procedure: CYSTOSCOPY FLEXIBLE;  Surgeon: Bjorn Pippin, MD;  Location: [facility];  Service: Urology;  Laterality: N/A;  . EPIDIDYMECTOMY Left 11/09/2013   Procedure:  LEFT EPIDIDYMECTOMY;  Surgeon: Bjorn Pippin, MD;  Location: [facility];  Service: Urology;  Laterality: Left;  POSSIBLE OUTPATIENT WITH OBSERVATION  . EXCISION LIPOMA LEFT SHOULDER  2004 (APPROX)  . MULTIPLE CYST REMOVED FROM CHEST  AGE 69  . OTHER SURGICAL HISTORY     hemorroid surgery   . TESTICLE REMOVAL Left        Home Medications    Prior to Admission medications   Medication Sig Start Date End Date Taking? Authorizing Provider  amitriptyline (ELAVIL) 25 MG tablet Take 25 mg by mouth at bedtime.    [provider]  atorvastatin (LIPITOR) 80 MG tablet Take 80 mg by mouth daily.    [provider]  benzonatate (TESSALON) 100 MG capsule Take 1 capsule (100  mg total) by mouth every 8 (eight) hours. 08/06/17   Rolland Porter, MD  fluticasone-salmeterol (ADVAIR HFA) [phone] MCG/ACT inhaler Inhale 2 puffs into the lungs daily.    [provider]  gabapentin (NEURONTIN) 300 MG capsule Take 300 mg by mouth 2 (two) times daily.     [provider]  insulin glargine (LANTUS) 100 UNIT/ML injection Inject 45 Units into the skin at bedtime.     [provider]  insulin lispro (HUMALOG) 100 UNIT/ML injection Inject 6 Units into the skin 3 (three) times daily before meals. Sliding scale    [provider]  levETIRAcetam (KEPPRA) 750 MG tablet Take 1 tablet (750 mg total) by mouth 2 (two) times daily. 07/05/17    Lonia Blood, MD  losartan (COZAAR) 50 MG tablet Take 50 mg by mouth daily.    [provider]  metFORMIN (GLUCOPHAGE) 500 MG tablet Take 1,000 mg by mouth 2 (two) times daily with a meal.     [provider]  methylPREDNISolone (MEDROL DOSEPAK) 4 MG TBPK tablet 6 po on day 1, decrease by 1 tab per day 08/06/17   Rolland Porter, MD  promethazine (PHENERGAN) 25 MG tablet Take 1 tablet (25 mg total) by mouth every 6 (six) hours as needed for nausea or vomiting. 05/04/17   Vanetta Mulders, MD  promethazine-dextromethorphan (PROMETHAZINE-DM) 6.25-15 MG/5ML syrup Take 5 mLs by mouth 4 (four) times daily as needed for cough. 08/06/17   Rolland Porter, MD  rizatriptan (MAXALT) 10 MG tablet Take 10 mg by mouth as needed for migraine. May repeat in 2 hours if needed    [provider]  sucralfate (CARAFATE) 1 GM/10ML suspension Take 10 mLs (1 g total) by mouth 4 (four) times daily -  with meals and at bedtime. 06/09/17   Palumbo, April, MD    Family History No family history on file.  Social History Social History  Substance Use Topics  . Smoking status: Former Smoker    Packs/day: 0.25    Years: 15.00    Types: Cigarettes    Quit date: 11/08/1992  . Smokeless tobacco: Never Used  . Alcohol use No     Allergies   Nsaids; Tramadol; Amoxicillin; Ampicillin; Dilantin [phenytoin]; Penicillins; Risperidone and related; Strawberry extract; Bactrim [sulfamethoxazole-trimethoprim]; Depakote [divalproex sodium]; and Oatmeal   Review of Systems Review of Systems  Constitutional: Negative for fever.  HENT: Negative for congestion.   Eyes: Positive for visual disturbance. Negative for redness.  Respiratory: Negative for shortness of breath.   Cardiovascular: Negative for chest pain.  Gastrointestinal: Negative for abdominal pain.  Musculoskeletal: Negative for back pain.  Neurological: Positive for seizures and headaches.  Hematological: Does not bruise/bleed easily.    Psychiatric/Behavioral: Positive for confusion.     Physical Exam Updated Vital Signs BP (!) 130/98 (BP Location: Right Arm)   Pulse 96   Resp (!) 25   SpO2 96%   Physical Exam  Constitutional: He appears well-developed and well-nourished.  HENT:  Head: Normocephalic and atraumatic.  Mouth/Throat: Oropharynx is clear and moist.  Patient did not bite his tongue. Mucous membranes are moist. Oropharynx is clear.  Eyes: Pupils are equal, round, and reactive to light. Conjunctivae and EOM are normal.  Neck: Normal range of motion. Neck supple.  Cardiovascular: Normal rate and regular rhythm.   Pulmonary/Chest: Effort normal and breath sounds normal. No respiratory distress.  Abdominal: Soft. Bowel sounds are normal. There is no tenderness.  Musculoskeletal: Normal range of motion. He exhibits no  edema.  Neurological: He is alert. No cranial nerve deficit or sensory deficit. He exhibits normal muscle tone. Coordination normal.  Patient drowsy but will follow commands. Does state that he is seeing double.  Skin: Skin is warm.  Nursing note and vitals reviewed.    ED Treatments / Results  Labs (all labs ordered are listed, but only abnormal results are displayed) Labs Reviewed  COMPREHENSIVE METABOLIC PANEL - Abnormal; Notable for the following:       Result Value   Potassium 3.3 (*)    Glucose, Bld 127 (*)    All other components within normal limits  CBC WITH DIFFERENTIAL/PLATELET - Abnormal; Notable for the following:    Hemoglobin 11.9 (*)    HCT 35.8 (*)    All other components within normal limits  CBG MONITORING, ED - Abnormal; Notable for the following:    Glucose-Capillary 136 (*)    All other components within normal limits  ETHANOL  I-STAT CG4 LACTIC ACID, ED    EKG  EKG Interpretation  Date/Time:  Wednesday September 01 2017 14:34:30 EDT Ventricular Rate:  98 PR Interval:    QRS Duration: 86 QT Interval:  311 QTC Calculation: 397 R Axis:   -27 Text  Interpretation:  Sinus rhythm Borderline left axis deviation No significant change since last tracing Confirmed by Vanetta Mulders [phone]) on 09/01/2017 2:50:07 PM       Radiology Ct Head Wo Contrast  Result Date: 09/01/2017 CLINICAL DATA:  56 year old who complained of a severe headache earlier today and had a seizure immediately prior to entering the emergency department. Patient has a history of grand mal seizures since age 44. EXAM: CT HEAD WITHOUT CONTRAST TECHNIQUE: Contiguous axial images were obtained from the base of the skull through the vertex without intravenous contrast. COMPARISON:  MRI brain 07/03/2017, 06/05/2016. CT head 07/02/2017, 05/29/2017 and earlier. FINDINGS: Brain: Ventricular system normal in size and appearance for age. No mass lesion. No midline shift. No acute hemorrhage or hematoma. No extra-axial fluid collections. No evidence of acute infarction. Physiologic calcifications in the left basal ganglia. Asymmetric prominence of the right superior sylvian fissure compared to the left which accounts for low-attenuation on axial image 16 (the prominent superior right sylvian fissure has been present on multiple prior examinations and is unchanged). No significant focal parenchymal abnormality. Vascular: No visible atherosclerosis.  No hyperdense vessel. Skull: No skull fracture or other focal osseous abnormality involving the skull. Sinuses/Orbits: Visualized paranasal sinuses, bilateral mastoid air cells and bilateral middle ear cavities well-aerated. Visualized orbits and globes are normal. Other: None. IMPRESSION: 1. No acute intracranial abnormality. 2. Asymmetric prominence of the right superior sylvian fissure as noted on multiple prior examinations, an anatomic variant. Electronically Signed   By: Hulan Saas M.D.   On: 09/01/2017 15:41    Procedures Procedures (including critical care time)  Medications Ordered in ED Medications  0.9 %  sodium chloride infusion (  Intravenous New Bag/Given 09/01/17 1456)  dexamethasone (DECADRON) injection 10 mg (not administered)  diphenhydrAMINE (BENADRYL) injection 25 mg (not administered)  metoCLOPramide (REGLAN) injection 10 mg (not administered)  sodium chloride 0.9 % bolus 500 mL (500 mLs Intravenous New Bag/Given 09/01/17 1455)  LORazepam (ATIVAN) injection 1 mg (1 mg Intravenous Given 09/01/17 1519)  HYDROmorphone (DILAUDID) injection 1 mg (1 mg Intravenous Given 09/01/17 1519)  ondansetron (ZOFRAN) injection 4 mg (4 mg Intravenous Given 09/01/17 1519)     Initial Impression / Assessment and Plan / ED Course  I have reviewed the  triage vital signs and the nursing notes.  Pertinent labs & imaging results that were available during my care of the patient were reviewed by me and considered in my medical decision making (see chart for details).     Patient arrived postictal. Shortly after arrival he would start to follow commands. But was drowsy. Complaining of headache all over. Patient has a history of migraines as well as generalized seizures. Reportedly had 10 minute seizure in the vehicle in route to here. Patient states he was seen in [facility] last night for headache but there are notes seem to imply was seen for seizure then. Patient followed by neurology in the [facility] area. Was seen a few days ago by them and had an increase in his Keppra medication. Patient's main complaint is the headache.  Review of past visits and past notes patient [facility] July for a very similar presentation where he presented to the [place] with a complaint of headache and then shortly thereafter had a seizure. Had no further seizures in the hospital. But continued with the headache. Was treated with Maxalt but the headache did not completely resolve but was improved and patient was discharged home with follow-up.  Based on this presentation. We'll go ahead and treat with a migraine cocktail to  include Decadron Benadryl and Reglan. Patient already received 1 mg of hydromorphone. Patient's received 1 mg of Ativan.  Patient's labs without significant abnormalities. No leukocytosis lactic acid isn't elevated blood sugars not significant we elevated. All little suspicious for prolonged seizure activity. Not clear whether it was true seizures. CT of head without any acute findings. Not hypertensive.  Patient still fairly drowsy suggested that he's still somewhat postictal. Will treat the migraine patient will require further observation. If patient does not wake up or improved significantly may possibly require admission.   Final Clinical Impressions(s) / ED Diagnoses   Final diagnoses:  Seizure-like activity (HCC)  Other migraine with status migrainosus, intractable    New Prescriptions New Prescriptions   No medications on file     Vanetta Mulders, MD 09/01/17 [phone]

## 2017-09-01 NOTE — ED Notes (Signed)
ED Provider at bedside. 

## 2017-09-01 NOTE — ED Notes (Signed)
Family at bedside. 

## 2017-09-01 NOTE — ED Triage Notes (Signed)
Pt found to be seizing in the car out front. Pt removed from car onto our stretcher and brought to a room. Pt has hx of seizures. EDP at bedside for eval.

## 2017-09-01 NOTE — ED Notes (Signed)
Patient transported to CT 

## 2017-09-01 NOTE — ED Provider Notes (Signed)
Patient's headache has improved. He is afebrile and seems to be improving. No signs of meningismus. Minimal hypokalemia, given oral potassium here. No further seizure-like activity while being in the ED for over 4 hours. I think he is safe for discharge home to follow-up close it with his neurologist. Discussed return precautions.   Pricilla Loveless, MD 09/01/17 [phone]

## 2017-10-13 ENCOUNTER — Emergency Department (HOSPITAL_BASED_OUTPATIENT_CLINIC_OR_DEPARTMENT_OTHER): Payer: Medicare Other

## 2017-10-13 ENCOUNTER — Encounter (HOSPITAL_BASED_OUTPATIENT_CLINIC_OR_DEPARTMENT_OTHER): Payer: Self-pay | Admitting: *Deleted

## 2017-10-13 ENCOUNTER — Other Ambulatory Visit: Payer: Self-pay

## 2017-10-13 ENCOUNTER — Emergency Department (HOSPITAL_BASED_OUTPATIENT_CLINIC_OR_DEPARTMENT_OTHER)
Admission: EM | Admit: 2017-10-13 | Discharge: 2017-10-13 | Disposition: A | Discharge status: Home / Self Care | Payer: Medicare Other | Attending: Emergency Medicine | Admitting: Emergency Medicine

## 2017-10-13 DIAGNOSIS — S29001A Unspecified injury of muscle and tendon of front wall of thorax, initial encounter: Secondary | ICD-10-CM | POA: Diagnosis present

## 2017-10-13 DIAGNOSIS — E0590 Thyrotoxicosis, unspecified without thyrotoxic crisis or storm: Secondary | ICD-10-CM | POA: Insufficient documentation

## 2017-10-13 DIAGNOSIS — J45909 Unspecified asthma, uncomplicated: Secondary | ICD-10-CM | POA: Insufficient documentation

## 2017-10-13 DIAGNOSIS — I10 Essential (primary) hypertension: Secondary | ICD-10-CM | POA: Diagnosis not present

## 2017-10-13 DIAGNOSIS — Z79891 Long term (current) use of opiate analgesic: Secondary | ICD-10-CM | POA: Diagnosis not present

## 2017-10-13 DIAGNOSIS — E119 Type 2 diabetes mellitus without complications: Secondary | ICD-10-CM | POA: Insufficient documentation

## 2017-10-13 DIAGNOSIS — Z79811 Long term (current) use of aromatase inhibitors: Secondary | ICD-10-CM | POA: Diagnosis not present

## 2017-10-13 DIAGNOSIS — S29011A Strain of muscle and tendon of front wall of thorax, initial encounter: Secondary | ICD-10-CM | POA: Insufficient documentation

## 2017-10-13 DIAGNOSIS — Z794 Long term (current) use of insulin: Secondary | ICD-10-CM | POA: Diagnosis not present

## 2017-10-13 DIAGNOSIS — R0789 Other chest pain: Secondary | ICD-10-CM

## 2017-10-13 DIAGNOSIS — Y939 Activity, unspecified: Secondary | ICD-10-CM | POA: Insufficient documentation

## 2017-10-13 DIAGNOSIS — X58XXXA Exposure to other specified factors, initial encounter: Secondary | ICD-10-CM | POA: Insufficient documentation

## 2017-10-13 DIAGNOSIS — Y999 Unspecified external cause status: Secondary | ICD-10-CM | POA: Diagnosis not present

## 2017-10-13 DIAGNOSIS — Y929 Unspecified place or not applicable: Secondary | ICD-10-CM | POA: Diagnosis not present

## 2017-10-13 DIAGNOSIS — Z79899 Other long term (current) drug therapy: Secondary | ICD-10-CM | POA: Insufficient documentation

## 2017-10-13 DIAGNOSIS — T148XXA Other injury of unspecified body region, initial encounter: Secondary | ICD-10-CM

## 2017-10-13 MED ORDER — CYCLOBENZAPRINE HCL 5 MG PO TABS: 5.0000 mg | ORAL_TABLET | Freq: Three times a day (TID) | ORAL | 0 refills | Status: DC | PRN

## 2017-10-13 MED ORDER — CYCLOBENZAPRINE HCL 5 MG PO TABS
5.0000 mg | ORAL_TABLET | Freq: Once | ORAL | Status: DC
Start: 2017-10-13 — End: 2017-10-13
  Filled 2017-10-13: qty 1

## 2017-10-13 MED ORDER — CARISOPRODOL 350 MG PO TABS: 350.0000 mg | ORAL_TABLET | Freq: Three times a day (TID) | ORAL | 0 refills | Status: DC | PRN

## 2017-10-13 MED FILL — CARISOPRODOL 350 MG TABLET: 350 | 5 days supply | Qty: 15 | Fill #0

## 2017-10-13 NOTE — ED Notes (Signed)
Pt given Rx for Soma. Flexeril Rx in pharmacy destroyed. Pt taken to pharmacy via wheelchair. Security with pt and will take to car after Rx filled

## 2017-10-13 NOTE — ED Provider Notes (Signed)
[facility] EMERGENCY DEPARTMENT Provider Note   CSN: [id number] Arrival date & time: 10/13/17  [id number]     History   Chief Complaint Chief Complaint  Patient presents with  . Chest Pain    Rib pain     HPI Nicholas [name] is a 56 y.o. male.  HPI Patient complains of right lower rib pain that started yesterday evening around 930.  Reports that the pain is worse with movement and coughing.  No shortness of breath, fever or chills.  No known trauma. Past Medical History:  Diagnosis Date  . Asthma   . Bipolar 1 disorder (HCC)   . Borderline glaucoma   . Epididymal pain    LEFT  . Epilepsy, 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4---  LAST SEIZURE 1 WK AGO (APPROX ,  10-31-2013)   NO NEUROLOGIST---  PT SEES PCP  DR Lindajo RoyalAVLOUT  . Feeling of incomplete bladder emptying   . Frequency of urination   . Gastric ulcer   . GERD (gastroesophageal reflux disease)   . Hypertension   . Hyperthyroidism    NO MEDS   . Migraine   . Seizures (HCC)   . Type 2 diabetes mellitus [facility](HCC)     Patient Active Problem List   Diagnosis Date Noted  . Essential hypertension   . Seizure disorder (HCC) 07/02/2017  . Insulin-requiring or dependent type II diabetes mellitus (HCC) 07/02/2017  . CKD (chronic kidney disease), stage II 07/02/2017  . Normocytic anemia 07/02/2017  . Testicular/scrotal pain 11/09/2013  . Microhematuria 11/09/2013  . Condyloma acuminatum of scrotum 11/09/2013    Past Surgical History:  Procedure Laterality Date  . ANTERIOR CERVICAL DECOMP/DISCECTOMY FUSION  2007   C4  --  C6  . CERVICAL FUSION    . CHOLECYSTECTOMY    . EXCISION LIPOMA LEFT SHOULDER  2004 (APPROX)  . MULTIPLE CYST REMOVED FROM CHEST  AGE 74  . OTHER SURGICAL HISTORY     hemorroid surgery   . TESTICLE REMOVAL Left   . TONSILLECTOMY         Home Medications    Prior to Admission medications   Medication Sig Start Date End Date Taking? Authorizing Provider  nortriptyline (PAMELOR) 50 MG capsule Take  50 mg at bedtime by mouth.   Yes [provider]  amitriptyline (ELAVIL) 25 MG tablet Take 25 mg by mouth at bedtime.    [provider]  atorvastatin (LIPITOR) 80 MG tablet Take 80 mg by mouth daily.    [provider]  benzonatate (TESSALON) 100 MG capsule Take 1 capsule (100 mg total) by mouth every 8 (eight) hours. 08/06/17   Rolland PorterJames, Mark, MD  cyclobenzaprine (FLEXERIL) 5 MG tablet Take 1 tablet (5 mg total) 3 (three) times daily as needed by mouth for muscle spasms. 10/13/17   Loren RacerYelverton, Yarieliz Wasser, MD  fluticasone-salmeterol (ADVAIR HFA) [phone] MCG/ACT inhaler Inhale 2 puffs into the lungs daily.    [provider]  gabapentin (NEURONTIN) 300 MG capsule Take 300 mg by mouth 2 (two) times daily.     [provider]  insulin glargine (LANTUS) 100 UNIT/ML injection Inject 45 Units into the skin at bedtime.     [provider]  insulin lispro (HUMALOG) 100 UNIT/ML injection Inject 6 Units into the skin 3 (three) times daily before meals. Sliding scale    [provider]  levETIRAcetam (KEPPRA) 750 MG tablet Take 1 tablet (750 mg total) by mouth 2 (two) times daily. 07/05/17   McClung,  Elpidio EricJeffrey T, MD  losartan (COZAAR) 50 MG tablet Take 50 mg by mouth daily.    [provider]  metFORMIN (GLUCOPHAGE) 500 MG tablet Take 1,000 mg by mouth 2 (two) times daily with a meal.     [provider]  methylPREDNISolone (MEDROL DOSEPAK) 4 MG TBPK tablet 6 po on day 1, decrease by 1 tab per day 08/06/17   Rolland PorterJames, Mark, MD  promethazine (PHENERGAN) 25 MG tablet Take 1 tablet (25 mg total) by mouth every 6 (six) hours as needed for nausea or vomiting. 05/04/17   Vanetta MuldersZackowski, Scott, MD  promethazine-dextromethorphan (PROMETHAZINE-DM) 6.25-15 MG/5ML syrup Take 5 mLs by mouth 4 (four) times daily as needed for cough. 08/06/17   Rolland PorterJames, Mark, MD  rizatriptan (MAXALT) 10 MG tablet Take 10 mg by mouth as needed for migraine. May repeat in 2 hours if needed     [provider]  sucralfate (CARAFATE) 1 GM/10ML suspension Take 10 mLs (1 g total) by mouth 4 (four) times daily -  with meals and at bedtime. 06/09/17   Palumbo, April, MD    Family History History reviewed. No pertinent family history.  Social History Social History   Tobacco Use  . Smoking status: Former Smoker    Packs/day: 0.25    Years: 15.00    Pack years: 3.75    Types: Cigarettes    Last attempt to quit: 11/08/1992    Years since quitting: 24.9  . Smokeless tobacco: Never Used  Substance Use Topics  . Alcohol use: No  . Drug use: No     Allergies   Nsaids; Tramadol; Amoxicillin; Ampicillin; Dilantin [phenytoin]; Penicillins; Risperidone and related; Strawberry extract; Bactrim [sulfamethoxazole-trimethoprim]; Depakote [divalproex sodium]; and Oatmeal   Review of Systems Review of Systems  Constitutional: Negative for chills and fever.  Respiratory: Positive for cough. Negative for shortness of breath.   Cardiovascular: Positive for chest pain. Negative for palpitations and leg swelling.  Gastrointestinal: Negative for abdominal pain, nausea and vomiting.  Genitourinary: Negative for dysuria, flank pain, frequency and hematuria.  Musculoskeletal: Positive for back pain. Negative for myalgias and neck pain.  Skin: Negative for rash and wound.  Neurological: Negative for dizziness, weakness, light-headedness, numbness and headaches.  All other systems reviewed and are negative.    Physical Exam Updated Vital Signs BP 124/81 (BP Location: Right Arm)   Pulse 76   Temp 98.3 F (36.8 C) (Oral)   Resp 18   Ht 5\' 7"  (1.702 m)   Wt 98.9 kg (218 lb)   SpO2 96%   BMI 34.14 kg/m   Physical Exam  Constitutional: He is oriented to person, place, and time. He appears well-developed and well-nourished.  Non-toxic appearance. He does not appear ill. No distress.  HENT:  Head: Normocephalic and atraumatic.  Mouth/Throat: Oropharynx is clear and moist.  Eyes:  EOM are normal. Pupils are equal, round, and reactive to light.  Neck: Normal range of motion. Neck supple.  Cardiovascular: Normal rate and regular rhythm.  Pulmonary/Chest: Effort normal. He has wheezes in the right lower field.  Abdominal: Soft. Bowel sounds are normal. He exhibits no distension and no mass. There is no tenderness. There is no rebound and no guarding.  Musculoskeletal: Normal range of motion. He exhibits no edema or tenderness.  Patient has right inferior/lateral rib tenderness to palpation.  There is no crepitance.  Patient also has right-sided lumbar paraspinal muscular tenderness.  No CVA tenderness.  No lower extremity swelling, asymmetry or tenderness.  Distal pulses  are 2+.  Neurological: He is alert and oriented to person, place, and time.  Skin: Skin is warm and dry. No rash noted. No erythema.  Psychiatric: He has a normal mood and affect. His behavior is normal.  Nursing note and vitals reviewed.    ED Treatments / Results  Labs (all labs ordered are listed, but only abnormal results are displayed) Labs Reviewed - No data to display  EKG  EKG Interpretation None       Radiology Dg Ribs Unilateral W/chest Right  Result Date: 10/13/2017 CLINICAL DATA:  Acute onset pain EXAM: RIGHT RIBS AND CHEST - 3+ VIEW COMPARISON:  Chest radiograph June 06, 2017 FINDINGS: Frontal chest as well as oblique and cone-down rib images obtained. There is no edema or consolidation. Heart is upper normal in size with pulmonary vascularity within normal limits. No adenopathy. There is postoperative change in the cervical spine. There is no pneumothorax or pleural effusion evident. No rib fracture evident. IMPRESSION: No evident rib fracture. No edema or consolidation. No pneumothorax. Electronically Signed   By: Bretta Bang III M.D.   On: 10/13/2017 08:05    Procedures Procedures (including critical care time)  Medications Ordered in ED Medications  cyclobenzaprine  (FLEXERIL) tablet 5 mg (not administered)     Initial Impression / Assessment and Plan / ED Course  I have reviewed the triage vital signs and the nursing notes.  Pertinent labs & imaging results that were available during my care of the patient were reviewed by me and considered in my medical decision making (see chart for details).     Patient is resting comfortably.  Vital signs are stable.  X-ray without acute findings.  Likely muscular strain.  Low suspicion for PE/CAD.  Advised to follow-up with primary physician.  Return precautions given.  Final Clinical Impressions(s) / ED Diagnoses   Final diagnoses:  Chest wall pain  Muscle strain    ED Discharge Orders        Ordered    cyclobenzaprine (FLEXERIL) 5 MG tablet  3 times daily PRN     10/13/17 0835       Loren Racer, MD 10/13/17 [phone]

## 2017-10-13 NOTE — ED Triage Notes (Signed)
Pt with right lower rib pain since 2130 last PM got up to go to [place] and heard a Pop pain has been 10/10 since  [facility]

## 2017-11-18 ENCOUNTER — Other Ambulatory Visit: Payer: Self-pay

## 2017-11-18 ENCOUNTER — Encounter (HOSPITAL_BASED_OUTPATIENT_CLINIC_OR_DEPARTMENT_OTHER): Payer: Self-pay | Admitting: *Deleted

## 2017-11-18 ENCOUNTER — Emergency Department (HOSPITAL_BASED_OUTPATIENT_CLINIC_OR_DEPARTMENT_OTHER): Payer: Medicare Other

## 2017-11-18 ENCOUNTER — Emergency Department (HOSPITAL_BASED_OUTPATIENT_CLINIC_OR_DEPARTMENT_OTHER)
Admission: EM | Admit: 2017-11-18 | Discharge: 2017-11-18 | Disposition: A | Discharge status: Home / Self Care | Payer: Medicare Other | Attending: Emergency Medicine | Admitting: Emergency Medicine

## 2017-11-18 DIAGNOSIS — E0590 Thyrotoxicosis, unspecified without thyrotoxic crisis or storm: Secondary | ICD-10-CM | POA: Diagnosis not present

## 2017-11-18 DIAGNOSIS — R0789 Other chest pain: Secondary | ICD-10-CM | POA: Insufficient documentation

## 2017-11-18 DIAGNOSIS — Z87891 Personal history of nicotine dependence: Secondary | ICD-10-CM | POA: Insufficient documentation

## 2017-11-18 DIAGNOSIS — R739 Hyperglycemia, unspecified: Secondary | ICD-10-CM

## 2017-11-18 DIAGNOSIS — J45909 Unspecified asthma, uncomplicated: Secondary | ICD-10-CM | POA: Insufficient documentation

## 2017-11-18 DIAGNOSIS — E1165 Type 2 diabetes mellitus with hyperglycemia: Secondary | ICD-10-CM | POA: Diagnosis not present

## 2017-11-18 DIAGNOSIS — Z9049 Acquired absence of other specified parts of digestive tract: Secondary | ICD-10-CM | POA: Insufficient documentation

## 2017-11-18 DIAGNOSIS — Z794 Long term (current) use of insulin: Secondary | ICD-10-CM | POA: Insufficient documentation

## 2017-11-18 DIAGNOSIS — Z79899 Other long term (current) drug therapy: Secondary | ICD-10-CM | POA: Diagnosis not present

## 2017-11-18 DIAGNOSIS — N182 Chronic kidney disease, stage 2 (mild): Secondary | ICD-10-CM | POA: Insufficient documentation

## 2017-11-18 DIAGNOSIS — I129 Hypertensive chronic kidney disease with stage 1 through stage 4 chronic kidney disease, or unspecified chronic kidney disease: Secondary | ICD-10-CM | POA: Diagnosis not present

## 2017-11-18 DIAGNOSIS — E1122 Type 2 diabetes mellitus with diabetic chronic kidney disease: Secondary | ICD-10-CM | POA: Insufficient documentation

## 2017-11-18 DIAGNOSIS — R079 Chest pain, unspecified: Secondary | ICD-10-CM | POA: Diagnosis present

## 2017-11-18 LAB — COMPREHENSIVE METABOLIC PANEL
ALT: 69 U/L — ABNORMAL HIGH (ref 17–63)
AST: 54 U/L — ABNORMAL HIGH (ref 15–41)
Albumin: 4.6 g/dL (ref 3.5–5.0)
Alkaline Phosphatase: 65 U/L (ref 38–126)
Anion gap: 9 (ref 5–15)
BUN: 25 mg/dL — ABNORMAL HIGH (ref 6–20)
CO2: 24 mmol/L (ref 22–32)
Calcium: 9.9 mg/dL (ref 8.9–10.3)
Chloride: 101 mmol/L (ref 101–111)
Creatinine, Ser: 1.54 mg/dL — ABNORMAL HIGH (ref 0.61–1.24)
GFR calc Af Amer: 57 mL/min — ABNORMAL LOW (ref >60)
GFR calc non Af Amer: 49 mL/min — ABNORMAL LOW (ref >60)
Glucose, Bld: 437 mg/dL — ABNORMAL HIGH (ref 65–99)
Potassium: 4.5 mmol/L (ref 3.5–5.1)
Sodium: 134 mmol/L — ABNORMAL LOW (ref 135–145)
Total Bilirubin: 0.4 mg/dL (ref 0.3–1.2)
Total Protein: 8.1 g/dL (ref 6.5–8.1)

## 2017-11-18 LAB — TROPONIN I: Troponin I: <0.03 ng/mL (ref <0.03)

## 2017-11-18 LAB — CBC
HCT: 44.9 % (ref 39.0–52.0)
Hemoglobin: 15.1 g/dL (ref 13.0–17.0)
MCH: 28.2 pg (ref 26.0–34.0)
MCHC: 33.6 g/dL (ref 30.0–36.0)
MCV: 83.8 fL (ref 78.0–100.0)
Platelets: 190 10*3/uL (ref 150–400)
RBC: 5.36 MIL/uL (ref 4.22–5.81)
RDW: 14.7 % (ref 11.5–15.5)
WBC: 6.1 10*3/uL (ref 4.0–10.5)

## 2017-11-18 LAB — LIPASE, BLOOD: Lipase: 23 U/L (ref 11–51)

## 2017-11-18 LAB — D-DIMER, QUANTITATIVE (NOT AT ARMC): D-Dimer, Quant: 0.64 ug/mL-FEU — ABNORMAL HIGH (ref 0.00–0.50)

## 2017-11-18 MED ORDER — SODIUM CHLORIDE 0.9 % IV BOLUS (SEPSIS)
1000.0000 mL | Freq: Once | INTRAVENOUS | Status: AC
Start: 2017-11-18 — End: 2017-11-18
  Administered 2017-11-18: 1000 mL via INTRAVENOUS

## 2017-11-18 MED ORDER — ONDANSETRON HCL 4 MG/2ML IJ SOLN
4.0000 mg | Freq: Once | INTRAMUSCULAR | Status: AC
  Administered 2017-11-18: 4 mg via INTRAVENOUS
  Filled 2017-11-18: qty 2

## 2017-11-18 MED ORDER — IOPAMIDOL (ISOVUE-370) INJECTION 76%
100.0000 mL | Freq: Once | INTRAVENOUS | Status: AC | PRN
  Administered 2017-11-18: 100 mL via INTRAVENOUS

## 2017-11-18 MED ORDER — SODIUM CHLORIDE 0.9 % IV BOLUS (SEPSIS)
1000.0000 mL | Freq: Once | INTRAVENOUS | Status: AC
  Administered 2017-11-18: 1000 mL via INTRAVENOUS

## 2017-11-18 MED ORDER — FENTANYL CITRATE (PF) 100 MCG/2ML IJ SOLN
100.0000 ug | Freq: Once | INTRAMUSCULAR | Status: AC
  Administered 2017-11-18: 100 ug via INTRAVENOUS
  Filled 2017-11-18: qty 2

## 2017-11-18 NOTE — ED Triage Notes (Signed)
Sharp pain in his chest x 2 days. Pain is in both arms and across his diaphragm.

## 2017-11-18 NOTE — ED Provider Notes (Signed)
[facility] EMERGENCY DEPARTMENT Provider Note   CSN: [id number] Arrival date & time: 11/18/17  1856     History   Chief Complaint Chief Complaint  Patient presents with  . Chest Pain    HPI Nicholas [name] is a 56 y.o. male.  HPI  56 year old male presents with chest pain for 2 days.  He states that it originally started with some right arm pain that he described as cramping and then left arm pain that also felt like cramping.  Those have resolved and now he is having continuous chest pain.  It is worse with inspiration and certain movements.  Some cough but also feels a burning in his chest.  No abdominal pain.  The pain radiates from the middle of his chest to the left side of his chest.  He has not had any fevers.  He recently had an endoscopy about 6 days ago where he was told he had a hiatal hernia.  Has not taken anything for the pain.  The pain is currently severe.  Past Medical History:  Diagnosis Date  . Asthma   . Bipolar 1 disorder (HCC)   . Borderline glaucoma   . Epididymal pain    LEFT  . 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 Feeling of incomplete bladder emptying   . Frequency of urination   . Gastric ulcer   . GERD (gastroesophageal reflux disease)   . Hypertension   . Hyperthyroidism    NO MEDS   . Migraine   . Seizures (HCC)   . Type 2 diabetes mellitus Nicholas [name])     Patient Active Problem List   Diagnosis Date Noted  . Essential hypertension   . Seizure disorder (HCC) 07/02/2017  . Insulin-requiring or dependent type II diabetes mellitus (HCC) 07/02/2017  . CKD (chronic kidney disease), stage II 07/02/2017  . Normocytic anemia 07/02/2017  . Testicular/scrotal pain 11/09/2013  . Microhematuria 11/09/2013  . Condyloma acuminatum of scrotum 11/09/2013    Past Surgical History:  Procedure Laterality Date  . ANTERIOR CERVICAL DECOMP/DISCECTOMY  FUSION  2007   C4  --  C6  . CERVICAL FUSION    . CHOLECYSTECTOMY    . CYSTOSCOPY N/A 11/09/2013   Procedure: CYSTOSCOPY FLEXIBLE;  Surgeon: Nicholas Pippin, MD;  Location: [name];  Service: Urology;  Laterality: N/A;  . EPIDIDYMECTOMY Left 11/09/2013   Procedure:  LEFT EPIDIDYMECTOMY;  Surgeon: Nicholas Pippin, MD;  Location: [name];  Service: Urology;  Laterality: Left;  POSSIBLE OUTPATIENT WITH OBSERVATION  . EXCISION LIPOMA LEFT SHOULDER  2004 (APPROX)  . MULTIPLE CYST REMOVED FROM CHEST  AGE 29  . OTHER SURGICAL HISTORY     hemorroid surgery   . TESTICLE REMOVAL Left   . TONSILLECTOMY         Home Medications    Prior to Admission medications   Medication Sig Start Date End Date Taking? Authorizing Provider  amitriptyline (ELAVIL) 25 MG tablet Take 25 mg by mouth at bedtime.    [provider]  atorvastatin (LIPITOR) 80 MG tablet Take 80 mg by mouth daily.    [provider]  benzonatate (TESSALON) 100 MG capsule Take 1 capsule (100 mg total) by mouth every 8 (eight) hours. 08/06/17   Rolland Porter, MD  carisoprodol (SOMA) 350 MG tablet Take 1 tablet (350 mg total)  3 (three) times daily as needed by mouth for muscle spasms. 10/13/17   Loren RacerYelverton, David, MD  fluticasone-salmeterol (ADVAIR HFA) [phone] MCG/ACT inhaler Inhale 2 puffs into the lungs daily.    [provider]  gabapentin (NEURONTIN) 300 MG capsule Take 300 mg by mouth 2 (two) times daily.     [provider]  insulin glargine (LANTUS) 100 UNIT/ML injection Inject 45 Units into the skin at bedtime.     [provider]  insulin lispro (HUMALOG) 100 UNIT/ML injection Inject 6 Units into the skin 3 (three) times daily before meals. Sliding scale    [provider]  levETIRAcetam (KEPPRA) 750 MG tablet Take 1 tablet (750 mg total) by mouth 2 (two) times daily. 07/05/17   Lonia BloodMcClung, Jeffrey T, MD  losartan (COZAAR) 50 MG tablet Take 50 mg by mouth  daily.    [provider]  metFORMIN (GLUCOPHAGE) 500 MG tablet Take 1,000 mg by mouth 2 (two) times daily with a meal.     [provider]  methylPREDNISolone (MEDROL DOSEPAK) 4 MG TBPK tablet 6 po on day 1, decrease by 1 tab per day 08/06/17   Rolland PorterJames, Mark, MD  nortriptyline (PAMELOR) 50 MG capsule Take 50 mg at bedtime by mouth.    [provider]  promethazine (PHENERGAN) 25 MG tablet Take 1 tablet (25 mg total) by mouth every 6 (six) hours as needed for nausea or vomiting. 05/04/17   Vanetta MuldersZackowski, Aliea Bobe, MD  promethazine-dextromethorphan (PROMETHAZINE-DM) 6.25-15 MG/5ML syrup Take 5 mLs by mouth 4 (four) times daily as needed for cough. 08/06/17   Rolland PorterJames, Mark, MD  rizatriptan (MAXALT) 10 MG tablet Take 10 mg by mouth as needed for migraine. May repeat in 2 hours if needed    [provider]  sucralfate (CARAFATE) 1 GM/10ML suspension Take 10 mLs (1 g total) by mouth 4 (four) times daily -  with meals and at bedtime. 06/09/17   Palumbo, April, MD    Family History No family history on file.  Social History Social History   Tobacco Use  . Smoking status: Former Smoker    Packs/day: 0.25    Years: 15.00    Pack years: 3.75    Types: Cigarettes    Last attempt to quit: 11/08/1992    Years since quitting: 25.0  . Smokeless tobacco: Never Used  Substance Use Topics  . Alcohol use: No  . Drug use: No     Allergies   Nsaids; Tramadol; Amoxicillin; Ampicillin; Dilantin [phenytoin]; Penicillins; Risperidone and related; Strawberry extract; Bactrim [sulfamethoxazole-trimethoprim]; Depakote [divalproex sodium]; Flexeril [cyclobenzaprine]; Ultram [tramadol hcl]; and Oatmeal   Review of Systems Review of Systems  Constitutional: Negative for fever.  Respiratory: Positive for cough and shortness of breath.   Cardiovascular: Positive for chest pain. Negative for leg swelling.  Gastrointestinal: Positive for nausea. Negative for abdominal pain and vomiting.    Musculoskeletal: Negative for back pain.  All other systems reviewed and are negative.    Physical Exam Updated Vital Signs BP 121/76   Pulse 97   Temp 98 F (36.7 C) (Oral)   Resp (!) 22   Ht 5\' 7"  (1.702 m)   Wt 98.9 kg (218 lb)   SpO2 94%   BMI 34.14 kg/m   Physical Exam  Constitutional: He is oriented to person, place, and time. He appears well-developed and well-nourished.  Non-toxic appearance. He does not appear ill. No distress.  HENT:  Head: Normocephalic and atraumatic.  Right Ear: External ear normal.  Left Ear: External ear normal.  Nose: Nose normal.  Eyes: Right eye exhibits no discharge. Left eye exhibits no discharge.  Neck: Neck supple.  Cardiovascular: Regular rhythm and normal heart sounds. Tachycardia present.  Pulmonary/Chest: Effort normal and breath sounds normal. He exhibits tenderness.    Abdominal: Soft. There is no tenderness.  Musculoskeletal: He exhibits no edema.  Neurological: He is alert and oriented to person, place, and time.  Skin: Skin is warm and dry.  Nursing note and vitals reviewed.    ED Treatments / Results  Labs (all labs ordered are listed, but only abnormal results are displayed) Labs Reviewed  COMPREHENSIVE METABOLIC PANEL - Abnormal; Notable for the following components:      Result Value   Sodium 134 (*)    Glucose, Bld 437 (*)    BUN 25 (*)    Creatinine, Ser 1.54 (*)    AST 54 (*)    ALT 69 (*)    GFR calc non Af Amer 49 (*)    GFR calc Af Amer 57 (*)    All other components within normal limits  D-DIMER, QUANTITATIVE (NOT AT [facility]) - Abnormal; Notable for the following components:   D-Dimer, Quant 0.64 (*)    All other components within normal limits  CBC  TROPONIN I  LIPASE, BLOOD    EKG  EKG Interpretation  Date/Time:  Thursday November 18 2017 19:12:17 EST Ventricular Rate:  107 PR Interval:  162 QRS Duration: 86 QT Interval:  322 QTC Calculation: 429 R Axis:   -35 Text Interpretation:   Sinus tachycardia Left axis deviation Abnormal ECG no significant change since Sept 2018 Confirmed by Pricilla LovelessGoldston, Bright Spielmann [phone]([id number]) on 11/18/2017 7:43:03 PM       Radiology Dg Chest 2 View  Result Date: 11/18/2017 CLINICAL DATA:  Chest and right arm pain for 2 days.  Asthma. EXAM: CHEST  2 VIEW COMPARISON:  06/06/2017 FINDINGS: The heart size and mediastinal contours are within normal limits. Stable mild tortuosity of thoracic aorta. Both lungs are clear. No evidence of pneumothorax or pleural effusion. Lower cervical spine fusion hardware again noted. IMPRESSION: No active cardiopulmonary disease. Electronically Signed   By: Myles RosenthalJohn  Stahl M.D.   On: 11/18/2017 19:58   Ct Angio Chest Pe W/cm &/or Wo Cm  Result Date: 11/18/2017 CLINICAL DATA:  Sharp chest pain and bilateral upper extremity pain for 2 days. EXAM: CT ANGIOGRAPHY CHEST WITH CONTRAST TECHNIQUE: Multidetector CT imaging of the chest was performed using the standard protocol during bolus administration of intravenous contrast. Multiplanar CT image reconstructions and MIPs were obtained to evaluate the vascular anatomy. CONTRAST:  100mL ISOVUE-370 IOPAMIDOL (ISOVUE-370) INJECTION 76% COMPARISON:  Radiographs 11/18/2017, CT 03/10/2017. FINDINGS: Cardiovascular: Satisfactory opacification of the pulmonary arteries to the segmental level. No evidence of pulmonary embolism. Normal heart size. No pericardial effusion. The thoracic aorta is normal in caliber and intact. Mediastinum/Nodes: No enlarged mediastinal, hilar, or axillary lymph nodes. Thyroid gland, trachea, and esophagus demonstrate no significant findings. Lungs/Pleura: Lungs are clear. No pleural effusion or pneumothorax. Upper Abdomen: Marked fatty infiltration of the liver, incompletely imaged. Musculoskeletal: No significant skeletal lesion. Review of the MIP images confirms the above findings. IMPRESSION: 1. Negative for acute pulmonary embolism. No acute findings in the chest. 2. Marked  fatty infiltration of the liver, incompletely imaged. Electronically Signed   By: Ellery Plunkaniel R Mitchell M.D.   On: 11/18/2017 22:55    Procedures Procedures (including critical care time)  Medications Ordered in ED Medications  fentaNYL (SUBLIMAZE)  injection 100 mcg (100 mcg Intravenous Given 11/18/17 2058)  sodium chloride 0.9 % bolus 1,000 mL (0 mLs Intravenous Stopped 11/18/17 2143)  sodium chloride 0.9 % bolus 1,000 mL (1,000 mLs Intravenous New Bag/Given 11/18/17 2143)  ondansetron (ZOFRAN) injection 4 mg (4 mg Intravenous Given 11/18/17 2058)  iopamidol (ISOVUE-370) 76 % injection 100 mL (100 mLs Intravenous Contrast Given 11/18/17 2211)     Initial Impression / Assessment and Plan / ED Course  I have reviewed the triage vital signs and the nursing notes.  Pertinent labs & imaging results that were available during my care of the patient were reviewed by me and considered in my medical decision making (see chart for details).     She is currently feeling better.  Given his tachycardia with pleuritic chest pain, d-dimer sent for otherwise low risk PE.  This is mildly positive but his CT is negative for acute disease including no PE.  I think this is probably chest wall in etiology.  I doubt ACS given continuous pain for 48 hours or more and no significant ECG changes besides tachycardia.  No abdominal pain and he has had a prior cholecystectomy.  At this point, he has some hyperglycemia but no other signal abnormalities on workup.  He was given IV fluids.  He is unable to take NSAIDs but discussed using Tylenol.  At this point he appears stable for discharge with outpatient management.  Discussed return precautions.  Follow-up with PCP.  Final Clinical Impressions(s) / ED Diagnoses   Final diagnoses:  Chest wall pain  Hyperglycemia    ED Discharge Orders    None       Pricilla Loveless, MD 11/18/17 2322

## 2017-11-26 ENCOUNTER — Encounter (HOSPITAL_BASED_OUTPATIENT_CLINIC_OR_DEPARTMENT_OTHER): Payer: Self-pay | Admitting: Emergency Medicine

## 2017-11-26 ENCOUNTER — Emergency Department (HOSPITAL_BASED_OUTPATIENT_CLINIC_OR_DEPARTMENT_OTHER)
Admission: EM | Admit: 2017-11-26 | Discharge: 2017-11-26 | Disposition: A | Discharge status: Home / Self Care | Payer: Medicare Other | Attending: Emergency Medicine | Admitting: Emergency Medicine

## 2017-11-26 DIAGNOSIS — J45909 Unspecified asthma, uncomplicated: Secondary | ICD-10-CM | POA: Insufficient documentation

## 2017-11-26 DIAGNOSIS — I129 Hypertensive chronic kidney disease with stage 1 through stage 4 chronic kidney disease, or unspecified chronic kidney disease: Secondary | ICD-10-CM | POA: Diagnosis not present

## 2017-11-26 DIAGNOSIS — E1122 Type 2 diabetes mellitus with diabetic chronic kidney disease: Secondary | ICD-10-CM | POA: Insufficient documentation

## 2017-11-26 DIAGNOSIS — Z87891 Personal history of nicotine dependence: Secondary | ICD-10-CM | POA: Diagnosis not present

## 2017-11-26 DIAGNOSIS — N182 Chronic kidney disease, stage 2 (mild): Secondary | ICD-10-CM | POA: Diagnosis not present

## 2017-11-26 DIAGNOSIS — Z79899 Other long term (current) drug therapy: Secondary | ICD-10-CM | POA: Diagnosis not present

## 2017-11-26 DIAGNOSIS — B372 Candidiasis of skin and nail: Secondary | ICD-10-CM | POA: Insufficient documentation

## 2017-11-26 DIAGNOSIS — R21 Rash and other nonspecific skin eruption: Secondary | ICD-10-CM | POA: Diagnosis present

## 2017-11-26 DIAGNOSIS — Z794 Long term (current) use of insulin: Secondary | ICD-10-CM | POA: Insufficient documentation

## 2017-11-26 MED ORDER — CLOTRIMAZOLE 1 % EX CREA: TOPICAL_CREAM | CUTANEOUS | 0 refills | Status: DC

## 2017-11-26 NOTE — ED Triage Notes (Signed)
Patient states that he has a rash and pus drainage to his groin  - very painful

## 2017-11-26 NOTE — ED Provider Notes (Signed)
[facility] EMERGENCY DEPARTMENT Provider Note   CSN: [id number] Arrival date & time: 11/26/17  1832     History   Chief Complaint Chief Complaint  Patient presents with  . Rash    groin    HPI Nicholas [name] is a 56 y.o. male.  HPI   56yo male presents with concern for groin rash. Reports noting pain, burning pain over skin beginning today. Located on left side. Has not tried anything yet for pain. No fevers or other concerns.  Reports glucose was running high earlier this week but today has been WNL.    Past Medical History:  Diagnosis Date  . Asthma   . Bipolar 1 disorder (HCC)   . Borderline glaucoma   . Epididymal pain    LEFT  . 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 Feeling of incomplete bladder emptying   . Frequency of urination   . Gastric ulcer   . GERD (gastroesophageal reflux disease)   . Hypertension   . Hyperthyroidism    NO MEDS   . Migraine   . Seizures (HCC)   . Type 2 diabetes mellitus [facility](HCC)     Patient Active Problem List   Diagnosis Date Noted  . Essential hypertension   . Seizure disorder (HCC) 07/02/2017  . Insulin-requiring or dependent type II diabetes mellitus (HCC) 07/02/2017  . CKD (chronic kidney disease), stage II 07/02/2017  . Normocytic anemia 07/02/2017  . Testicular/scrotal pain 11/09/2013  . Microhematuria 11/09/2013  . Condyloma acuminatum of scrotum 11/09/2013    Past Surgical History:  Procedure Laterality Date  . ANTERIOR CERVICAL DECOMP/DISCECTOMY FUSION  2007   C4  --  C6  . CERVICAL FUSION    . CHOLECYSTECTOMY    . CYSTOSCOPY N/A 11/09/2013   Procedure: CYSTOSCOPY FLEXIBLE;  Surgeon: Bjorn PippinJohn Wrenn, MD;  [facility];  Service: Urology;  Laterality: N/A;  . EPIDIDYMECTOMY Left 11/09/2013   Procedure:  LEFT EPIDIDYMECTOMY;  Surgeon: Bjorn PippinJohn Wrenn, MD;  [facility] ;   Service: Urology;  Laterality: Left;  POSSIBLE OUTPATIENT WITH OBSERVATION  . EXCISION LIPOMA LEFT SHOULDER  2004 (APPROX)  . MULTIPLE CYST REMOVED FROM CHEST  AGE 24  . OTHER SURGICAL HISTORY     hemorroid surgery   . TESTICLE REMOVAL Left   . TONSILLECTOMY         Home Medications    Prior to Admission medications   Medication Sig Start Date End Date Taking? Authorizing Provider  amitriptyline (ELAVIL) 25 MG tablet Take 25 mg by mouth at bedtime.    [provider]  atorvastatin (LIPITOR) 80 MG tablet Take 80 mg by mouth daily.    [provider]  benzonatate (TESSALON) 100 MG capsule Take 1 capsule (100 mg total) by mouth every 8 (eight) hours. 08/06/17   Rolland PorterJames, Mark, MD  carisoprodol (SOMA) 350 MG tablet Take 1 tablet (350 mg total) 3 (three) times daily as needed by mouth for muscle spasms. 10/13/17   Loren RacerYelverton, David, MD  clotrimazole (LOTRIMIN) 1 % cream Apply to affected area 2 times daily until it is resolved. 11/26/17   Alvira MondaySchlossman, Izabel Chim, MD  fluticasone-salmeterol (ADVAIR HFA) [phone] MCG/ACT inhaler Inhale 2 puffs into the lungs daily.    [provider]  gabapentin (NEURONTIN) 300 MG capsule Take 300 mg by mouth 2 (two) times daily.  [provider]  insulin glargine (LANTUS) 100 UNIT/ML injection Inject 45 Units into the skin at bedtime.     [provider]  insulin lispro (HUMALOG) 100 UNIT/ML injection Inject 6 Units into the skin 3 (three) times daily before meals. Sliding scale    [provider]  levETIRAcetam (KEPPRA) 750 MG tablet Take 1 tablet (750 mg total) by mouth 2 (two) times daily. 07/05/17   Lonia Blood, MD  losartan (COZAAR) 50 MG tablet Take 50 mg by mouth daily.    [provider]  metFORMIN (GLUCOPHAGE) 500 MG tablet Take 1,000 mg by mouth 2 (two) times daily with a meal.     [provider]  methylPREDNISolone (MEDROL DOSEPAK) 4 MG TBPK tablet 6 po on day 1, decrease by 1 tab per  day 08/06/17   Rolland Porter, MD  nortriptyline (PAMELOR) 50 MG capsule Take 50 mg at bedtime by mouth.    [provider]  promethazine (PHENERGAN) 25 MG tablet Take 1 tablet (25 mg total) by mouth every 6 (six) hours as needed for nausea or vomiting. 05/04/17   Vanetta Mulders, MD  promethazine-dextromethorphan (PROMETHAZINE-DM) 6.25-15 MG/5ML syrup Take 5 mLs by mouth 4 (four) times daily as needed for cough. 08/06/17   Rolland Porter, MD  rizatriptan (MAXALT) 10 MG tablet Take 10 mg by mouth as needed for migraine. May repeat in 2 hours if needed    [provider]  sucralfate (CARAFATE) 1 GM/10ML suspension Take 10 mLs (1 g total) by mouth 4 (four) times daily -  with meals and at bedtime. 06/09/17   Palumbo, April, MD    Family History History reviewed. No pertinent family history.  Social History Social History   Tobacco Use  . Smoking status: Former Smoker    Packs/day: 0.25    Years: 15.00    Pack years: 3.75    Types: Cigarettes    Last attempt to quit: 11/08/1992    Years since quitting: 25.0  . Smokeless tobacco: Never Used  Substance Use Topics  . Alcohol use: No  . Drug use: No     Allergies   Nsaids; Tramadol; Amoxicillin; Ampicillin; Dilantin [phenytoin]; Penicillins; Risperidone and related; Strawberry extract; Bactrim [sulfamethoxazole-trimethoprim]; Depakote [divalproex sodium]; Flexeril [cyclobenzaprine]; Ultram [tramadol hcl]; and Oatmeal   Review of Systems Review of Systems  Constitutional: Negative for fever.  HENT: Negative for sore throat.   Eyes: Negative for visual disturbance.  Respiratory: Negative for shortness of breath.   Cardiovascular: Negative for chest pain.  Gastrointestinal: Negative for abdominal pain, nausea and vomiting.  Genitourinary: Negative for difficulty urinating, penile pain, scrotal swelling and testicular pain.  Musculoskeletal: Negative for neck stiffness.  Skin: Positive for rash.  Neurological: Negative for  syncope and headaches.     Physical Exam Updated Vital Signs BP 113/74 (BP Location: Right Arm)   Pulse (!) 102   Temp 97.7 F (36.5 C) (Oral)   Resp 18   Ht 5\' 7"  (1.702 m)   Wt 98.9 kg (218 lb)   SpO2 94%   BMI 34.14 kg/m   Physical Exam  Constitutional: He is oriented to person, place, and time. He appears well-developed and well-nourished. No distress.  HENT:  Head: Normocephalic and atraumatic.  Eyes: Conjunctivae and EOM are normal.  Neck: Normal range of motion.  Cardiovascular: Normal rate, regular rhythm, normal heart sounds and intact distal pulses. Exam reveals no gallop and no friction rub.  No murmur heard. Pulmonary/Chest: Effort normal and breath sounds  normal. No respiratory distress. He has no wheezes. He has no rales.  Abdominal: Soft. He exhibits no distension. There is no tenderness. There is no guarding.  Genitourinary:  Genitourinary Comments: No scrotal erythema, tenderness or crepitus  Musculoskeletal: He exhibits no edema.  Neurological: He is alert and oriented to person, place, and time.  Skin: Skin is warm and dry. He is not diaphoretic. There is erythema (left groin plaque, central ulceration with tenderness, yellow exudate).  Nursing note and vitals reviewed.    ED Treatments / Results  Labs (all labs ordered are listed, but only abnormal results are displayed) Labs Reviewed - No data to display  EKG  EKG Interpretation None       Radiology No results found.  Procedures Procedures (including critical care time)  Medications Ordered in ED Medications - No data to display   Initial Impression / Assessment and Plan / ED Course  I have reviewed the triage vital signs and the nursing notes.  Pertinent labs & imaging results that were available during my care of the patient were reviewed by me and considered in my medical decision making (see chart for details).     56yo male presents with concern for rash on the left groin.  No  sign of necrotizing fasciitis on exam.  Doubt cellulitis. Patient well appearing, afebrile.  Rash most consistent with fungal infection, suspect likely candida but consider other fungal infection.  Given lotrimin cream. Patient discharged in stable condition with understanding of reasons to return.   Final Clinical Impressions(s) / ED Diagnoses   Final diagnoses:  Candidal intertrigo    ED Discharge Orders        Ordered    clotrimazole (LOTRIMIN) 1 % cream     11/26/17 2220       Alvira MondaySchlossman, Alexyss Balzarini, MD 11/27/17 [phone]

## 2017-12-29 ENCOUNTER — Emergency Department (HOSPITAL_BASED_OUTPATIENT_CLINIC_OR_DEPARTMENT_OTHER): Payer: Medicare Other

## 2017-12-29 ENCOUNTER — Emergency Department (HOSPITAL_BASED_OUTPATIENT_CLINIC_OR_DEPARTMENT_OTHER)
Admission: EM | Admit: 2017-12-29 | Discharge: 2017-12-29 | Disposition: A | Discharge status: Home / Self Care | Payer: Medicare Other | Attending: Physician Assistant | Admitting: Physician Assistant

## 2017-12-29 ENCOUNTER — Encounter (HOSPITAL_BASED_OUTPATIENT_CLINIC_OR_DEPARTMENT_OTHER): Payer: Self-pay | Admitting: *Deleted

## 2017-12-29 ENCOUNTER — Other Ambulatory Visit: Payer: Self-pay

## 2017-12-29 DIAGNOSIS — M545 Low back pain, unspecified: Secondary | ICD-10-CM

## 2017-12-29 DIAGNOSIS — I129 Hypertensive chronic kidney disease with stage 1 through stage 4 chronic kidney disease, or unspecified chronic kidney disease: Secondary | ICD-10-CM | POA: Insufficient documentation

## 2017-12-29 DIAGNOSIS — Z87891 Personal history of nicotine dependence: Secondary | ICD-10-CM | POA: Insufficient documentation

## 2017-12-29 DIAGNOSIS — J45909 Unspecified asthma, uncomplicated: Secondary | ICD-10-CM | POA: Diagnosis not present

## 2017-12-29 DIAGNOSIS — B9789 Other viral agents as the cause of diseases classified elsewhere: Secondary | ICD-10-CM | POA: Diagnosis not present

## 2017-12-29 DIAGNOSIS — E1122 Type 2 diabetes mellitus with diabetic chronic kidney disease: Secondary | ICD-10-CM | POA: Insufficient documentation

## 2017-12-29 DIAGNOSIS — Z79899 Other long term (current) drug therapy: Secondary | ICD-10-CM | POA: Insufficient documentation

## 2017-12-29 DIAGNOSIS — Z794 Long term (current) use of insulin: Secondary | ICD-10-CM | POA: Insufficient documentation

## 2017-12-29 DIAGNOSIS — J069 Acute upper respiratory infection, unspecified: Secondary | ICD-10-CM | POA: Diagnosis not present

## 2017-12-29 DIAGNOSIS — N182 Chronic kidney disease, stage 2 (mild): Secondary | ICD-10-CM | POA: Diagnosis not present

## 2017-12-29 MED ORDER — METHOCARBAMOL 500 MG PO TABS: 500.0000 mg | ORAL_TABLET | Freq: Two times a day (BID) | ORAL | 0 refills | Status: DC

## 2017-12-29 NOTE — ED Triage Notes (Signed)
Pt c/o fall at work x 1 day ago c/o back pain

## 2017-12-29 NOTE — Discharge Instructions (Signed)
Please use ibuprofen and Tylenol to help with your back pain.  We encourage you to stretch, use heat and ice.  For your upper respiratory symptoms also use ibuprofen, and stay hydrated.

## 2017-12-29 NOTE — ED Provider Notes (Signed)
[facility] EMERGENCY DEPARTMENT Provider Note   CSN: [id number] Arrival date & time: 12/29/17  1750     History   Chief Complaint Chief Complaint  Patient presents with  . Back Pain    HPI Nicholas [name] is a 57 y.o. male.  HPI   57 year old male with multiple complaints.  Patient says that he has cold and cough congestion.  No fevers.  Occasional cough.  He also states he has been having low back pain.  He reports is after sitting for long periods of time.  He reports is an aching in both sides of his back.  No IV drug use.  No fevers.  No weakness.  Patient states that he thinks he fell last week on it.  Past Medical History:  Diagnosis Date  . Asthma   . Bipolar 1 disorder (HCC)   . Borderline glaucoma   . Epididymal pain    LEFT  . 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 Feeling of incomplete bladder emptying   . Frequency of urination   . Gastric ulcer   . GERD (gastroesophageal reflux disease)   . Hypertension   . Hyperthyroidism    NO MEDS   . Migraine   . Seizures (HCC)   . Type 2 diabetes mellitus [facility])     Patient Active Problem List   Diagnosis Date Noted  . Essential hypertension   . Seizure disorder (HCC) 07/02/2017  . Insulin-requiring or dependent type II diabetes mellitus (HCC) 07/02/2017  . CKD (chronic kidney disease), stage II 07/02/2017  . Normocytic anemia 07/02/2017  . Testicular/scrotal pain 11/09/2013  . Microhematuria 11/09/2013  . Condyloma acuminatum of scrotum 11/09/2013    Past Surgical History:  Procedure Laterality Date  . ANTERIOR CERVICAL DECOMP/DISCECTOMY FUSION  2007   C4  --  C6  . CERVICAL FUSION    . CHOLECYSTECTOMY    . CYSTOSCOPY N/A 11/09/2013   Procedure: CYSTOSCOPY FLEXIBLE;  Surgeon: Bjorn Pippin, MD;  Location: [facility];  Service: Urology;  Laterality: N/A;  . EPIDIDYMECTOMY Left 11/09/2013     Procedure:  LEFT EPIDIDYMECTOMY;  Surgeon: Bjorn Pippin, MD;  Location: [facility];  Service: Urology;  Laterality: Left;  POSSIBLE OUTPATIENT WITH OBSERVATION  . EXCISION LIPOMA LEFT SHOULDER  2004 (APPROX)  . MULTIPLE CYST REMOVED FROM CHEST  AGE 66  . OTHER SURGICAL HISTORY     hemorroid surgery   . TESTICLE REMOVAL Left   . TONSILLECTOMY         Home Medications    Prior to Admission medications   Medication Sig Start Date End Date Taking? Authorizing Provider  amitriptyline (ELAVIL) 25 MG tablet Take 25 mg by mouth at bedtime.    [provider]  atorvastatin (LIPITOR) 80 MG tablet Take 80 mg by mouth daily.    [provider]  benzonatate (TESSALON) 100 MG capsule Take 1 capsule (100 mg total) by mouth every 8 (eight) hours. 08/06/17   Rolland Porter, MD  carisoprodol (SOMA) 350 MG tablet Take 1 tablet (350 mg total) 3 (three) times daily as needed by mouth for muscle spasms. 10/13/17   Loren Racer, MD  clotrimazole (LOTRIMIN) 1 % cream Apply to affected area 2 times daily until it is resolved. 11/26/17   Alvira Monday, MD  fluticasone-salmeterol (ADVAIR HFA) 409-81 MCG/ACT inhaler Inhale 2  puffs into the lungs daily.    [provider]  gabapentin (NEURONTIN) 300 MG capsule Take 300 mg by mouth 2 (two) times daily.     [provider]  insulin glargine (LANTUS) 100 UNIT/ML injection Inject 45 Units into the skin at bedtime.     [provider]  insulin lispro (HUMALOG) 100 UNIT/ML injection Inject 6 Units into the skin 3 (three) times daily before meals. Sliding scale    [provider]  levETIRAcetam (KEPPRA) 750 MG tablet Take 1 tablet (750 mg total) by mouth 2 (two) times daily. 07/05/17   Lonia BloodMcClung, Jeffrey T, MD  losartan (COZAAR) 50 MG tablet Take 50 mg by mouth daily.    [provider]  metFORMIN (GLUCOPHAGE) 500 MG tablet Take 1,000 mg by mouth 2 (two) times daily with a meal.     [provider]  methylPREDNISolone (MEDROL DOSEPAK) 4 MG TBPK tablet 6 po on day 1, decrease by 1 tab per day 08/06/17   Rolland PorterJames, Mark, MD  nortriptyline (PAMELOR) 50 MG capsule Take 50 mg at bedtime by mouth.    [provider]  promethazine (PHENERGAN) 25 MG tablet Take 1 tablet (25 mg total) by mouth every 6 (six) hours as needed for nausea or vomiting. 05/04/17   Vanetta MuldersZackowski, Scott, MD  promethazine-dextromethorphan (PROMETHAZINE-DM) 6.25-15 MG/5ML syrup Take 5 mLs by mouth 4 (four) times daily as needed for cough. 08/06/17   Rolland PorterJames, Mark, MD  rizatriptan (MAXALT) 10 MG tablet Take 10 mg by mouth as needed for migraine. May repeat in 2 hours if needed    [provider]  sucralfate (CARAFATE) 1 GM/10ML suspension Take 10 mLs (1 g total) by mouth 4 (four) times daily -  with meals and at bedtime. 06/09/17   Palumbo, April, MD    Family History History reviewed. No pertinent family history.  Social History Social History   Tobacco Use  . Smoking status: Former Smoker    Packs/day: 0.25    Years: 15.00    Pack years: 3.75    Types: Cigarettes    Last attempt to quit: 11/08/1992    Years since quitting: 25.1  . Smokeless tobacco: Never Used  Substance Use Topics  . Alcohol use: No  . Drug use: No     Allergies   Nsaids; Tramadol; Amoxicillin; Ampicillin; Dilantin [phenytoin]; Penicillins; Risperidone and related; Strawberry extract; Bactrim [sulfamethoxazole-trimethoprim]; Depakote [divalproex sodium]; Flexeril [cyclobenzaprine]; Ultram [tramadol hcl]; and Oatmeal   Review of Systems Review of Systems  Constitutional: Positive for fatigue. Negative for fever.  HENT: Positive for congestion.   Respiratory: Positive for choking.   Musculoskeletal: Positive for back pain.     Physical Exam Updated Vital Signs BP (!) 149/105   Pulse (!) 104   Temp 98 F (36.7 C)   Resp 16   Ht 5\' 7"  (1.702 m)   Wt 98.9 kg (218 lb)   SpO2 98%   BMI 34.14 kg/m   Physical Exam    Constitutional: He is oriented to person, place, and time. He appears well-nourished.  HENT:  Head: Normocephalic.  Eyes: Conjunctivae are normal.  Neck: Normal range of motion.  Cardiovascular: Normal rate and regular rhythm.  No murmur heard. Pulmonary/Chest: Effort normal and breath sounds normal. No respiratory distress.  Abdominal: Soft.  Musculoskeletal:  Moves bilateral lower extremities well.  Able to ambulate. Tender across all of lumbar spine  Neurological: He is oriented to person, place, and time.  Skin: Skin is warm and  dry. He is not diaphoretic.  Psychiatric: He has a normal mood and affect. His behavior is normal.     ED Treatments / Results  Labs (all labs ordered are listed, but only abnormal results are displayed) Labs Reviewed - No data to display  EKG  EKG Interpretation None       Radiology Dg Lumbar Spine Complete  Result Date: 12/29/2017 CLINICAL DATA:  Fall with low back pain EXAM: LUMBAR SPINE - COMPLETE 4+ VIEW COMPARISON:  CT abdomen pelvis 09/02/2017 FINDINGS: There is transitional lumbosacral anatomy with sacralization of L5. alignment is normal. No fracture. Disc spaces and vertebral body heights are preserved. Normal facet articulations. IMPRESSION: Transitional lumbosacral anatomy, but otherwise normal lumbar spine. Electronically Signed   By: Deatra Robinson M.D.   On: 12/29/2017 20:33    Procedures Procedures (including critical care time)  Medications Ordered in ED Medications - No data to display   Initial Impression / Assessment and Plan / ED Course  I have reviewed the triage vital signs and the nursing notes.  Pertinent labs & imaging results that were available during my care of the patient were reviewed by me and considered in my medical decision making (see chart for details).    57 year old male with multiple complaints.  Patient says that he has cold and cough congestion.  No fevers.  Occasional cough.  He also states he  has been having low back pain.  He reports is after sitting for long periods of time.  He reports is an aching in both sides of his back.  No IV drug use.  No fevers.  No weakness.  Patient states that he thinks he fell last week on it.  8:52 PM Will get x-ray.  Think that his upper respiratory symptoms are likely upper respiratory virus.   No red flags for back pain.   Final Clinical Impressions(s) / ED Diagnoses   Final diagnoses:  Acute bilateral low back pain without sciatica  Viral URI with cough    ED Discharge Orders    None       Abelino Derrick, MD 12/29/17 2052

## 2018-02-17 ENCOUNTER — Emergency Department (HOSPITAL_BASED_OUTPATIENT_CLINIC_OR_DEPARTMENT_OTHER): Payer: Worker's Compensation

## 2018-02-17 ENCOUNTER — Emergency Department (HOSPITAL_BASED_OUTPATIENT_CLINIC_OR_DEPARTMENT_OTHER)
Admission: EM | Admit: 2018-02-17 | Discharge: 2018-02-18 | Disposition: A | Discharge status: Home / Self Care | Payer: Worker's Compensation | Attending: Emergency Medicine | Admitting: Emergency Medicine

## 2018-02-17 ENCOUNTER — Encounter (HOSPITAL_BASED_OUTPATIENT_CLINIC_OR_DEPARTMENT_OTHER): Payer: Self-pay

## 2018-02-17 ENCOUNTER — Other Ambulatory Visit: Payer: Self-pay

## 2018-02-17 DIAGNOSIS — S60031A Contusion of right middle finger without damage to nail, initial encounter: Secondary | ICD-10-CM | POA: Diagnosis not present

## 2018-02-17 DIAGNOSIS — N182 Chronic kidney disease, stage 2 (mild): Secondary | ICD-10-CM | POA: Insufficient documentation

## 2018-02-17 DIAGNOSIS — Y9289 Other specified places as the place of occurrence of the external cause: Secondary | ICD-10-CM | POA: Diagnosis not present

## 2018-02-17 DIAGNOSIS — J209 Acute bronchitis, unspecified: Secondary | ICD-10-CM

## 2018-02-17 DIAGNOSIS — W228XXA Striking against or struck by other objects, initial encounter: Secondary | ICD-10-CM | POA: Diagnosis not present

## 2018-02-17 DIAGNOSIS — J45909 Unspecified asthma, uncomplicated: Secondary | ICD-10-CM | POA: Insufficient documentation

## 2018-02-17 DIAGNOSIS — R079 Chest pain, unspecified: Secondary | ICD-10-CM | POA: Insufficient documentation

## 2018-02-17 DIAGNOSIS — R05 Cough: Secondary | ICD-10-CM | POA: Insufficient documentation

## 2018-02-17 DIAGNOSIS — Y990 Civilian activity done for income or pay: Secondary | ICD-10-CM | POA: Diagnosis not present

## 2018-02-17 DIAGNOSIS — Y9389 Activity, other specified: Secondary | ICD-10-CM | POA: Diagnosis not present

## 2018-02-17 DIAGNOSIS — Z794 Long term (current) use of insulin: Secondary | ICD-10-CM | POA: Insufficient documentation

## 2018-02-17 DIAGNOSIS — S6991XA Unspecified injury of right wrist, hand and finger(s), initial encounter: Secondary | ICD-10-CM | POA: Diagnosis present

## 2018-02-17 DIAGNOSIS — I129 Hypertensive chronic kidney disease with stage 1 through stage 4 chronic kidney disease, or unspecified chronic kidney disease: Secondary | ICD-10-CM | POA: Diagnosis not present

## 2018-02-17 DIAGNOSIS — Z79899 Other long term (current) drug therapy: Secondary | ICD-10-CM | POA: Insufficient documentation

## 2018-02-17 DIAGNOSIS — E1122 Type 2 diabetes mellitus with diabetic chronic kidney disease: Secondary | ICD-10-CM | POA: Insufficient documentation

## 2018-02-17 DIAGNOSIS — Z87891 Personal history of nicotine dependence: Secondary | ICD-10-CM | POA: Diagnosis not present

## 2018-02-17 NOTE — ED Triage Notes (Signed)
Pt states he injured right middle finger-hit it in cutting board approx 1130am-no break in skin noted-also c/o flu likes sx x 1 week-NAD-steady gait

## 2018-02-17 NOTE — ED Provider Notes (Signed)
[facility] EMERGENCY DEPARTMENT Provider Note   CSN: [id number] Arrival date & time: 02/17/18  2010     History   Chief Complaint Chief Complaint  Patient presents with  . Finger Injury    HPI Nicholas [name] is a 56 y.o. male.  The history is provided by the patient.  He has history of asthma, bipolar disorder, hypertension, diabetes, seizures and comes in with 2 complaints.  First, while at work, he hit his right third finger on a board.  He immediately applied ice.  He is complaining of pain in the proximal and middle phalanges.  Pain is rated at 9/10.  He is also complaining a cough for the last 2 weeks.  He initially had fever up to 101 was some chills.  However, that has resolved.  He continues to have cough productive of yellow sputum.  He is complaining of soreness in the right side of his chest because of coughing.  He denies any sick contacts.  He is not currently smoking.  Past Medical History:  Diagnosis Date  . Asthma   . Bipolar 1 disorder (HCC)   . Borderline glaucoma   . Epididymal pain    LEFT  . 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 Feeling of incomplete bladder emptying   . Frequency of urination   . Gastric ulcer   . GERD (gastroesophageal reflux disease)   . Hypertension   . Hyperthyroidism    NO MEDS   . Migraine   . Seizures (HCC)   . Type 2 diabetes mellitus [facility])     Patient Active Problem List   Diagnosis Date Noted  . Essential hypertension   . Seizure disorder (HCC) 07/02/2017  . Insulin-requiring or dependent type II diabetes mellitus (HCC) 07/02/2017  . CKD (chronic kidney disease), stage II 07/02/2017  . Normocytic anemia 07/02/2017  . Testicular/scrotal pain 11/09/2013  . Microhematuria 11/09/2013  . Condyloma acuminatum of scrotum 11/09/2013    Past Surgical History:  Procedure Laterality Date  . ANTERIOR CERVICAL  DECOMP/DISCECTOMY FUSION  2007   C4  --  C6  . CERVICAL FUSION    . CHOLECYSTECTOMY    . CYSTOSCOPY N/A 11/09/2013   Procedure: CYSTOSCOPY FLEXIBLE;  Surgeon: Bjorn Pippin, MD;  Location: [facility];  Service: Urology;  Laterality: N/A;  . EPIDIDYMECTOMY Left 11/09/2013   Procedure:  LEFT EPIDIDYMECTOMY;  Surgeon: Bjorn Pippin, MD;  Location[facility];  Service: Urology;  Laterality: Left;  POSSIBLE OUTPATIENT WITH OBSERVATION  . EXCISION LIPOMA LEFT SHOULDER  2004 (APPROX)  . MULTIPLE CYST REMOVED FROM CHEST  AGE 65  . OTHER SURGICAL HISTORY     hemorroid surgery   . TESTICLE REMOVAL Left   . TONSILLECTOMY         Home Medications    Prior to Admission medications   Medication Sig Start Date End Date Taking? Authorizing Provider  amitriptyline (ELAVIL) 25 MG tablet Take 25 mg by mouth at bedtime.    [provider]  atorvastatin (LIPITOR) 80 MG tablet Take 80 mg by mouth daily.    [provider]  benzonatate (TESSALON) 100 MG capsule Take 1 capsule (100 mg total) by mouth every 8 (eight) hours. 08/06/17   Rolland Porter, MD  carisoprodol (SOMA) 350 MG tablet Take 1 tablet (350 mg total) 3 (three) times daily  as needed by mouth for muscle spasms. 10/13/17   Loren RacerYelverton, Lonnette Shrode, MD  clotrimazole (LOTRIMIN) 1 % cream Apply to affected area 2 times daily until it is resolved. 11/26/17   Alvira MondaySchlossman, Erin, MD  fluticasone-salmeterol (ADVAIR HFA) 161-09115-21 MCG/ACT inhaler Inhale 2 puffs into the lungs daily.    [provider]  gabapentin (NEURONTIN) 300 MG capsule Take 300 mg by mouth 2 (two) times daily.     [provider]  insulin glargine (LANTUS) 100 UNIT/ML injection Inject 45 Units into the skin at bedtime.     [provider]  insulin lispro (HUMALOG) 100 UNIT/ML injection Inject 6 Units into the skin 3 (three) times daily before meals. Sliding scale    [provider]  levETIRAcetam (KEPPRA) 750 MG tablet Take  1 tablet (750 mg total) by mouth 2 (two) times daily. 07/05/17   Lonia BloodMcClung, Jeffrey T, MD  losartan (COZAAR) 50 MG tablet Take 50 mg by mouth daily.    [provider]  metFORMIN (GLUCOPHAGE) 500 MG tablet Take 1,000 mg by mouth 2 (two) times daily with a meal.     [provider]  methocarbamol (ROBAXIN) 500 MG tablet Take 1 tablet (500 mg total) by mouth 2 (two) times daily. 12/29/17   Mackuen, Courteney Lyn, MD  methylPREDNISolone (MEDROL DOSEPAK) 4 MG TBPK tablet 6 po on day 1, decrease by 1 tab per day 08/06/17   Rolland PorterJames, Mark, MD  nortriptyline (PAMELOR) 50 MG capsule Take 50 mg at bedtime by mouth.    [provider]  promethazine (PHENERGAN) 25 MG tablet Take 1 tablet (25 mg total) by mouth every 6 (six) hours as needed for nausea or vomiting. 05/04/17   Vanetta MuldersZackowski, Scott, MD  promethazine-dextromethorphan (PROMETHAZINE-DM) 6.25-15 MG/5ML syrup Take 5 mLs by mouth 4 (four) times daily as needed for cough. 08/06/17   Rolland PorterJames, Mark, MD  rizatriptan (MAXALT) 10 MG tablet Take 10 mg by mouth as needed for migraine. May repeat in 2 hours if needed    [provider]  sucralfate (CARAFATE) 1 GM/10ML suspension Take 10 mLs (1 g total) by mouth 4 (four) times daily -  with meals and at bedtime. 06/09/17   Palumbo, April, MD    Family History No family history on file.  Social History Social History   Tobacco Use  . Smoking status: Former Smoker    Packs/day: 0.25    Years: 15.00    Pack years: 3.75    Types: Cigarettes    Last attempt to quit: 11/08/1992    Years since quitting: 25.2  . Smokeless tobacco: Never Used  Substance Use Topics  . Alcohol use: No  . Drug use: No     Allergies   Nsaids; Tramadol; Amoxicillin; Ampicillin; Dilantin [phenytoin]; Penicillins; Risperidone and related; Strawberry extract; Bactrim [sulfamethoxazole-trimethoprim]; Depakote [divalproex sodium]; Flexeril [cyclobenzaprine]; Ultram [tramadol hcl]; and Oatmeal   Review of  Systems Review of Systems  All other systems reviewed and are negative.    Physical Exam Updated Vital Signs BP 128/90   Pulse 100   Temp 98.9 F (37.2 C) (Oral)   Resp 19   Wt 100.7 kg (222 lb 0.1 oz)   SpO2 100%   BMI 34.77 kg/m   Physical Exam  Nursing note and vitals reviewed.  57 year old male, resting comfortably and in no acute distress. Vital signs are normal. Oxygen saturation is 100%, which is normal. Head is normocephalic and atraumatic. PERRLA, EOMI. Oropharynx is clear. Neck is nontender and supple  without adenopathy or JVD. Back is nontender and there is no CVA tenderness. Lungs have coarse breath sounds with slightly prolonged exhalation phase but no overt rales, wheezes, or rhonchi. Chest is nontender. Heart has regular rate and rhythm without murmur. Abdomen is soft, flat, nontender without masses or hepatosplenomegaly and peristalsis is normoactive. Extremities: Minimal swelling of the proximal phalanx of the right third finger.  Moderate tenderness of the middle and distal phalanges of the right third finger.  No deformity noted.  No other extremity injury. Skin is warm and dry without rash. Neurologic: Mental status is normal, cranial nerves are intact, there are no motor or sensory deficits.  ED Treatments / Results  Labs (all labs ordered are listed, but only abnormal results are displayed) Labs Reviewed - No data to display  EKG  EKG Interpretation None       Radiology Dg Chest 2 View  Result Date: 02/18/2018 CLINICAL DATA:  Acute onset of cough. EXAM: CHEST - 2 VIEW COMPARISON:  Chest radiograph and CTA of the chest performed 11/18/2017 FINDINGS: The lungs are well-aerated and clear. There is no evidence of focal opacification, pleural effusion or pneumothorax. The heart is normal in size; the mediastinal contour is within normal limits. No acute osseous abnormalities are seen. IMPRESSION: No acute cardiopulmonary process seen. Electronically  Signed   By: Roanna Raider M.D.   On: 02/18/2018 01:24   Dg Finger Middle Right  Result Date: 02/17/2018 CLINICAL DATA:  57 year old male with injury to the right middle finger. EXAM: RIGHT MIDDLE FINGER 2+V COMPARISON:  None. FINDINGS: There is no acute fracture or dislocation. The bones are well mineralized. No significant arthritic changes. The soft tissues appear unremarkable. The IMPRESSION: Negative. Electronically Signed   By: Elgie Collard M.D.   On: 02/17/2018 21:09    Procedures Procedures   Medications Ordered in ED Medications  acetaminophen (TYLENOL) tablet 650 mg (not administered)  dexamethasone (DECADRON) tablet 10 mg (not administered)  ipratropium-albuterol (DUONEB) 0.5-2.5 (3) MG/3ML nebulizer solution 3 mL (3 mLs Nebulization Given 02/18/18 0011)  HYDROcodone-acetaminophen (NORCO/VICODIN) 5-325 MG per tablet 1 tablet (1 tablet Oral Given 02/18/18 0048)  ipratropium-albuterol (DUONEB) 0.5-2.5 (3) MG/3ML nebulizer solution 3 mL (3 mLs Nebulization Given 02/18/18 0056)     Initial Impression / Assessment and Plan / ED Course  I have reviewed the triage vital signs and the nursing notes.  Pertinent imaging results that were available during my care of the patient were reviewed by me and considered in my medical decision making (see chart for details).  Injury to right third finger.  X-rays are negative for fracture.  Cough with some element of bronchospasm.  Will send for chest x-ray and give nebulizer treatment with albuterol and ipratropium.  Old records are reviewed, and he has no relevant past visits.  12:44 AM Following nebulizer treatment, he feels she is raising sputum more easily but does not feel that his breathing is any better.  On reexam, lungs sound clear.  We will try an additional nebulizer treatment.  Chest x-ray still pending.  Chest x-ray shows no evidence of pneumonia.  He is feeling better following second nebulizer treatment.  Glucose is 178.  He is  given a dose of dexamethasone.  He is discharged with instructions to apply ice to his finger as needed, Buddy tape finger as needed.  Told to use his albuterol inhaler at home as needed to help control cough.  Return precautions discussed.  Final Clinical Impressions(s) / ED Diagnoses  Final diagnoses:  Acute bronchitis, unspecified organism  Contusion of right middle finger without damage to nail, initial encounter    ED Discharge Orders    None       Dione Booze, MD 02/18/18 0145

## 2018-02-18 ENCOUNTER — Emergency Department (HOSPITAL_BASED_OUTPATIENT_CLINIC_OR_DEPARTMENT_OTHER): Payer: Worker's Compensation

## 2018-02-18 LAB — CBG MONITORING, ED: Glucose-Capillary: 178 mg/dL — ABNORMAL HIGH (ref 65–99)

## 2018-02-18 MED ORDER — IPRATROPIUM-ALBUTEROL 0.5-2.5 (3) MG/3ML IN SOLN
3.0000 mL | Freq: Once | RESPIRATORY_TRACT | Status: AC
  Administered 2018-02-18: 3 mL via RESPIRATORY_TRACT
  Filled 2018-02-18: qty 3

## 2018-02-18 MED ORDER — ACETAMINOPHEN 325 MG PO TABS
650.0000 mg | ORAL_TABLET | Freq: Once | ORAL | Status: DC
  Filled 2018-02-18: qty 2

## 2018-02-18 MED ORDER — DEXAMETHASONE 4 MG PO TABS
10.0000 mg | ORAL_TABLET | Freq: Once | ORAL | Status: AC
  Administered 2018-02-18: 10 mg via ORAL
  Filled 2018-02-18: qty 1

## 2018-02-18 MED ORDER — HYDROCODONE-ACETAMINOPHEN 5-325 MG PO TABS
1.0000 | ORAL_TABLET | Freq: Once | ORAL | Status: AC
  Administered 2018-02-18: 1 via ORAL
  Filled 2018-02-18: qty 1

## 2018-02-18 NOTE — Discharge Instructions (Signed)
Buddy-tape your finger as needed. Apply ice several times a day.  Use your albuterol inhaler every four hours as needed to help suppress your cough.  Return if symptoms are getting worse.

## 2018-03-03 ENCOUNTER — Encounter (HOSPITAL_BASED_OUTPATIENT_CLINIC_OR_DEPARTMENT_OTHER): Payer: Self-pay | Admitting: *Deleted

## 2018-03-03 ENCOUNTER — Emergency Department (HOSPITAL_BASED_OUTPATIENT_CLINIC_OR_DEPARTMENT_OTHER)
Admission: EM | Admit: 2018-03-03 | Discharge: 2018-03-03 | Disposition: A | Discharge status: Home / Self Care | Payer: Worker's Compensation | Attending: Emergency Medicine | Admitting: Emergency Medicine

## 2018-03-03 ENCOUNTER — Other Ambulatory Visit: Payer: Self-pay

## 2018-03-03 DIAGNOSIS — Z79899 Other long term (current) drug therapy: Secondary | ICD-10-CM | POA: Insufficient documentation

## 2018-03-03 DIAGNOSIS — N182 Chronic kidney disease, stage 2 (mild): Secondary | ICD-10-CM | POA: Insufficient documentation

## 2018-03-03 DIAGNOSIS — Z87891 Personal history of nicotine dependence: Secondary | ICD-10-CM | POA: Insufficient documentation

## 2018-03-03 DIAGNOSIS — J45909 Unspecified asthma, uncomplicated: Secondary | ICD-10-CM | POA: Insufficient documentation

## 2018-03-03 DIAGNOSIS — L03011 Cellulitis of right finger: Secondary | ICD-10-CM | POA: Insufficient documentation

## 2018-03-03 DIAGNOSIS — M79644 Pain in right finger(s): Secondary | ICD-10-CM | POA: Diagnosis present

## 2018-03-03 DIAGNOSIS — E1122 Type 2 diabetes mellitus with diabetic chronic kidney disease: Secondary | ICD-10-CM | POA: Diagnosis not present

## 2018-03-03 DIAGNOSIS — I129 Hypertensive chronic kidney disease with stage 1 through stage 4 chronic kidney disease, or unspecified chronic kidney disease: Secondary | ICD-10-CM | POA: Diagnosis not present

## 2018-03-03 DIAGNOSIS — Z794 Long term (current) use of insulin: Secondary | ICD-10-CM | POA: Insufficient documentation

## 2018-03-03 MED ORDER — CLINDAMYCIN HCL 150 MG PO CAPS: 150.0000 mg | ORAL_CAPSULE | Freq: Four times a day (QID) | ORAL | 0 refills | Status: DC

## 2018-03-03 MED ORDER — HYDROCODONE-ACETAMINOPHEN 5-325 MG PO TABS
2.0000 | ORAL_TABLET | Freq: Once | ORAL | Status: AC
Start: 2018-03-03 — End: 2018-03-03
  Administered 2018-03-03: 2 via ORAL
  Filled 2018-03-03: qty 2

## 2018-03-03 MED ORDER — LIDOCAINE HCL 2 % IJ SOLN
10.0000 mL | Freq: Once | INTRAMUSCULAR | Status: AC
  Administered 2018-03-03: 200 mg
  Filled 2018-03-03: qty 20

## 2018-03-03 MED FILL — CLINDAMYCIN HCL 150 MG CAPS: 150 | 7 days supply | Qty: 28 | Fill #0

## 2018-03-03 NOTE — ED Provider Notes (Signed)
[facility] EMERGENCY DEPARTMENT Provider Note   CSN: [id number] Arrival date & time: 03/03/18  [id number]     History   Chief Complaint Chief Complaint  Patient presents with  . Hand Pain    HPI Nicholas [name] is a 57 y.o. male.  Patient is a 57 year old male with a history of asthma, bipolar disease, seizure disorder and poorly controlled diabetic presenting with worsening right third finger pain.  Patient states approximately 14 days ago he was at work and hit this finger on a board.  He was initially seen in the emergency department and had normal x-rays and was given an injection and the finger was buddy taped.  He states initially things started feeling better but then 4 days ago he started developing worsening pain at the end of the finger and the finger pad.  The pain has continued to be severe and he has noticed swelling.  He thinks he may have had a fever yesterday but denies any other fever.  Blood sugar has been in the 400s however patient states his last hemoglobin A1c was 10 so uncertain if this is his baseline.  Pain is worse with bending the finger and is sharp and tingling in nature.  The pain radiates down into the hand.  He noted yesterday looked red but does not look red today.  Pain is currently a 10 out of 10  The history is provided by the patient.    Past Medical History:  Diagnosis Date  . Asthma   . Bipolar 1 disorder (HCC)   . Borderline glaucoma   . Epididymal pain    LEFT  . 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 Feeling of incomplete bladder emptying   . Frequency of urination   . Gastric ulcer   . GERD (gastroesophageal reflux disease)   . Hypertension   . Hyperthyroidism    NO MEDS   . Migraine   . Seizures (HCC)   . Type 2 diabetes mellitus [facility](HCC)     Patient Active Problem List   Diagnosis Date Noted  . Essential hypertension   . Seizure  disorder (HCC) 07/02/2017  . Insulin-requiring or dependent type II diabetes mellitus (HCC) 07/02/2017  . CKD (chronic kidney disease), stage II 07/02/2017  . Normocytic anemia 07/02/2017  . Testicular/scrotal pain 11/09/2013  . Microhematuria 11/09/2013  . Condyloma acuminatum of scrotum 11/09/2013    Past Surgical History:  Procedure Laterality Date  . ANTERIOR CERVICAL DECOMP/DISCECTOMY FUSION  2007   C4  --  C6  . CERVICAL FUSION    . CHOLECYSTECTOMY    . CYSTOSCOPY N/A 11/09/2013   Procedure: CYSTOSCOPY FLEXIBLE;  Surgeon: Bjorn PippinJohn Wrenn, MD;  [facility] [place];  Service: Urology;  Laterality: N/A;  . EPIDIDYMECTOMY Left 11/09/2013   Procedure:  LEFT EPIDIDYMECTOMY;  Surgeon: Bjorn PippinJohn Wrenn, MD;  [facility] [place];  Service: Urology;  Laterality: Left;  POSSIBLE OUTPATIENT WITH OBSERVATION  . EXCISION LIPOMA LEFT SHOULDER  2004 (APPROX)  . MULTIPLE CYST REMOVED FROM CHEST  AGE 89  . OTHER SURGICAL HISTORY     hemorroid surgery   . TESTICLE REMOVAL Left   . TONSILLECTOMY          Home Medications    Prior to Admission medications   Medication Sig Start Date End Date Taking? Authorizing Provider  atorvastatin (  LIPITOR) 80 MG tablet Take 80 mg by mouth daily.   Yes [provider]  clotrimazole (LOTRIMIN) 1 % cream Apply to affected area 2 times daily until it is resolved. 11/26/17  Yes Alvira Monday, MD  fluticasone-salmeterol (ADVAIR HFA) 161-09 MCG/ACT inhaler Inhale 2 puffs into the lungs daily.   Yes [provider]  gabapentin (NEURONTIN) 300 MG capsule Take 300 mg by mouth 2 (two) times daily.    Yes [provider]  insulin glargine (LANTUS) 100 UNIT/ML injection Inject 45 Units into the skin at bedtime.    Yes [provider]  insulin lispro (HUMALOG) 100 UNIT/ML injection Inject 6 Units into the skin 3 (three) times daily before meals. Sliding scale   Yes [provider]  levETIRAcetam  (KEPPRA) 750 MG tablet Take 1 tablet (750 mg total) by mouth 2 (two) times daily. 07/05/17  Yes Lonia Blood, MD  losartan (COZAAR) 50 MG tablet Take 50 mg by mouth daily.   Yes [provider]  metFORMIN (GLUCOPHAGE) 500 MG tablet Take 1,000 mg by mouth 2 (two) times daily with a meal.    Yes [provider]  nortriptyline (PAMELOR) 50 MG capsule Take 50 mg at bedtime by mouth.   Yes [provider]  rizatriptan (MAXALT) 10 MG tablet Take 10 mg by mouth as needed for migraine. May repeat in 2 hours if needed   Yes [provider]  amitriptyline (ELAVIL) 25 MG tablet Take 25 mg by mouth at bedtime.    [provider]  benzonatate (TESSALON) 100 MG capsule Take 1 capsule (100 mg total) by mouth every 8 (eight) hours. 08/06/17   Rolland Porter, MD  carisoprodol (SOMA) 350 MG tablet Take 1 tablet (350 mg total) 3 (three) times daily as needed by mouth for muscle spasms. 10/13/17   Loren Racer, MD  promethazine (PHENERGAN) 25 MG tablet Take 1 tablet (25 mg total) by mouth every 6 (six) hours as needed for nausea or vomiting. 05/04/17   Vanetta Mulders, MD  promethazine-dextromethorphan (PROMETHAZINE-DM) 6.25-15 MG/5ML syrup Take 5 mLs by mouth 4 (four) times daily as needed for cough. 08/06/17   Rolland Porter, MD  sucralfate (CARAFATE) 1 GM/10ML suspension Take 10 mLs (1 g total) by mouth 4 (four) times daily -  with meals and at bedtime. 06/09/17   Palumbo, April, MD    Family History History reviewed. No pertinent family history.  Social History Social History   Tobacco Use  . Smoking status: Former Smoker    Packs/day: 0.25    Years: 15.00    Pack years: 3.75    Types: Cigarettes    Last attempt to quit: 11/08/1992    Years since quitting: 25.3  . Smokeless tobacco: Never Used  Substance Use Topics  . Alcohol use: No  . Drug use: No     Allergies   Nsaids; Tramadol; Amoxicillin; Ampicillin; Dilantin [phenytoin]; Penicillins; Risperidone  and related; Strawberry extract; Bactrim [sulfamethoxazole-trimethoprim]; Depakote [divalproex sodium]; Flexeril [cyclobenzaprine]; Ultram [tramadol hcl]; and Oatmeal   Review of Systems Review of Systems  All other systems reviewed and are negative.    Physical Exam Updated Vital Signs BP (!) 113/93 (BP Location: Right Arm)   Pulse (!) 118   Temp 98.1 F (36.7 C) (Oral)   Resp 18   Ht 5\' 7"  (1.702 m)   Wt 97.1 kg (214 lb)   SpO2 94%   BMI 33.52 kg/m   Physical Exam  Constitutional: He is oriented to person,  place, and time. He appears well-developed and well-nourished. No distress.  HENT:  Head: Normocephalic and atraumatic.  Eyes: Pupils are equal, round, and reactive to light. EOM are normal.  Cardiovascular: Tachycardia present.  Pulmonary/Chest: Effort normal and breath sounds normal. He has no wheezes.  Musculoskeletal: He exhibits tenderness.       Hands: Neurological: He is alert and oriented to person, place, and time.  Skin: Skin is warm and dry.  Psychiatric: He has a normal mood and affect. His behavior is normal.  Nursing note and vitals reviewed.    ED Treatments / Results  Labs (all labs ordered are listed, but only abnormal results are displayed) Labs Reviewed - No data to display  EKG None  Radiology No results found.  Procedures Procedures (including critical care time) INCISION AND DRAINAGE Performed by: Gwyneth Sprout Consent: Verbal consent obtained. Risks and benefits: risks, benefits and alternatives were discussed Type: abscess  Body area: right middle finger  Anesthesia:digital block  Incision was made with a scalpel.  Local anesthetic: lidocaine 2% without epinephrine  Anesthetic total: 4 ml  Complexity: complex Blunt dissection to break up loculations Irrigated with normal saline  Drainage: mild bloody drainage Drainage amount: 1mL  Packing material: 1/4 in iodoform gauze  Patient tolerance: Patient tolerated the  procedure well with no immediate complications.     Medications Ordered in ED Medications  HYDROcodone-acetaminophen (NORCO/VICODIN) 5-325 MG per tablet 2 tablet (has no administration in time range)     Initial Impression / Assessment and Plan / ED Course  I have reviewed the triage vital signs and the nursing notes.  Pertinent labs & imaging results that were available during my care of the patient were reviewed by me and considered in my medical decision making (see chart for details).     Patient is a 57 year old diabetic male that is poorly controlled presenting with worsening finger pain for the last 4 days.  Patient initially had an injury 14 days ago with a negative x-ray and told he had a hematoma and finger buddy taped and told to apply ice.  However now he is having significant pain in the pad of his finger concerning for possibly a felon given prior injury and history of poorly controlled diabetes and recent dexamethasone for acute bronchitis and asthma.  There is mild swelling and significant discomfort with palpation of the finger pad.  There is no significant erythema and patient is afebrile.  Will discuss with hand surgery.  8:53 AM Spoke with Dr. Janee Morn who recommended opening the area due to concern for a felon and starting oral antibiotics.  Discussed with the patient who is agreeable to the plan.  I&D as above.  Patient started on clindamycin.  Given follow-up Monday or Tuesday of next week.  Final Clinical Impressions(s) / ED Diagnoses   Final diagnoses:  Felon of finger of right hand    ED Discharge Orders        Ordered    clindamycin (CLEOCIN) 150 MG capsule  Every 6 hours     03/03/18 0858       Gwyneth Sprout, MD 03/03/18 1610

## 2018-03-03 NOTE — Discharge Instructions (Signed)
Soak your finger 20 minutes 2 times a day in Epsom salts or warm water.  On Saturday remove the packing and continue to soak and run your finger under water to keep it open.  Take all of the antibiotics.  Follow-up with the hand surgeon on Monday or Tuesday if it is not improving.

## 2018-03-03 NOTE — ED Notes (Signed)
NAD at this time. Pt is stable and going home.  

## 2018-03-03 NOTE — ED Triage Notes (Signed)
Right middle finger pain. Has already had xray. No broken bones. He stated that it turned red and now is so sore that he cant take.

## 2018-03-05 ENCOUNTER — Other Ambulatory Visit: Payer: Self-pay

## 2018-03-05 ENCOUNTER — Encounter (HOSPITAL_BASED_OUTPATIENT_CLINIC_OR_DEPARTMENT_OTHER): Payer: Self-pay | Admitting: *Deleted

## 2018-03-05 ENCOUNTER — Emergency Department (HOSPITAL_BASED_OUTPATIENT_CLINIC_OR_DEPARTMENT_OTHER)
Admission: EM | Admit: 2018-03-05 | Discharge: 2018-03-05 | Disposition: A | Discharge status: Home / Self Care | Payer: Worker's Compensation | Attending: Emergency Medicine | Admitting: Emergency Medicine

## 2018-03-05 DIAGNOSIS — Z794 Long term (current) use of insulin: Secondary | ICD-10-CM | POA: Insufficient documentation

## 2018-03-05 DIAGNOSIS — E119 Type 2 diabetes mellitus without complications: Secondary | ICD-10-CM | POA: Insufficient documentation

## 2018-03-05 DIAGNOSIS — M79644 Pain in right finger(s): Secondary | ICD-10-CM | POA: Diagnosis present

## 2018-03-05 DIAGNOSIS — I129 Hypertensive chronic kidney disease with stage 1 through stage 4 chronic kidney disease, or unspecified chronic kidney disease: Secondary | ICD-10-CM

## 2018-03-05 DIAGNOSIS — Z79899 Other long term (current) drug therapy: Secondary | ICD-10-CM

## 2018-03-05 DIAGNOSIS — I10 Essential (primary) hypertension: Secondary | ICD-10-CM | POA: Diagnosis not present

## 2018-03-05 DIAGNOSIS — Z09 Encounter for follow-up examination after completed treatment for conditions other than malignant neoplasm: Secondary | ICD-10-CM | POA: Insufficient documentation

## 2018-03-05 DIAGNOSIS — L03011 Cellulitis of right finger: Secondary | ICD-10-CM | POA: Diagnosis not present

## 2018-03-05 DIAGNOSIS — J45909 Unspecified asthma, uncomplicated: Secondary | ICD-10-CM | POA: Insufficient documentation

## 2018-03-05 DIAGNOSIS — Z87891 Personal history of nicotine dependence: Secondary | ICD-10-CM | POA: Insufficient documentation

## 2018-03-05 DIAGNOSIS — N182 Chronic kidney disease, stage 2 (mild): Secondary | ICD-10-CM

## 2018-03-05 MED ORDER — BUPIVACAINE HCL 0.5 % IJ SOLN
50.0000 mL | Freq: Once | INTRAMUSCULAR | Status: AC
  Administered 2018-03-05: 50 mL
  Filled 2018-03-05: qty 1

## 2018-03-05 MED ORDER — HYDROCODONE-ACETAMINOPHEN 5-325 MG PO TABS
2.0000 | ORAL_TABLET | Freq: Once | ORAL | Status: AC
  Administered 2018-03-05: 2 via ORAL
  Filled 2018-03-05: qty 2

## 2018-03-05 NOTE — ED Triage Notes (Signed)
Pt injured hand on March 14, a cutting board hit his hand. Since then he has had pain and swelling in right hand. He has been seen since the original injury and had it drained. He is on clindamycin. Pt is a diabetic

## 2018-03-05 NOTE — ED Notes (Signed)
Pt understood dc material. NAD noted. 

## 2018-03-05 NOTE — ED Triage Notes (Signed)
Pt was seen here Thursday and patients right middle finger was I&D. Pt was prescribed ABX and has been taking them as prescribed. Pt called hand surgeon and was referred back to ed. Pts pain has increased and swelling has advanced from right middle finger to adjacent ones. Pt states no fevers since Wednesday. States he is having tingling and numbness

## 2018-03-05 NOTE — ED Provider Notes (Signed)
[facility] Provider Note: Nicholas Dell, MD, FACEP  CSN: [id number] MRN: [id number] ARRIVAL: 03/05/18 at 0435 ROOM: [facility]05/[facility]05   CHIEF COMPLAINT  Hand Pain   HISTORY OF PRESENT ILLNESS  03/05/18 5:32 AM Nicholas [name] is a 57 y.o. male who was diagnosed with a felon of his right middle fingertip 2 days ago and underwent an I&D.  Packing was placed in the wound.  He removed the packing himself about 2 AM today and noticed a significant amount of purulent drainage.  He is having moderate to severe pain in the fingertip, worse with palpation or movement.  The pain radiates proximally to his hand.  The visit note from 2 days ago notes he had pain in a similar pattern.  He is here to have the finger reevaluated.   Past Medical History:  Diagnosis Date  . Asthma   . Bipolar 1 disorder (HCC)   . Borderline glaucoma   . Epididymal pain    LEFT  . 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 Feeling of incomplete bladder emptying   . Frequency of urination   . Gastric ulcer   . GERD (gastroesophageal reflux disease)   . Hypertension   . Hyperthyroidism    NO MEDS   . Migraine   . Seizures (HCC)   . Type 2 diabetes mellitus (HCC)     Past Surgical History:  Procedure Laterality Date  . ANTERIOR CERVICAL DECOMP/DISCECTOMY FUSION  2007   C4  --  C6  . CERVICAL FUSION    . CHOLECYSTECTOMY    . CYSTOSCOPY N/A 11/09/2013   Procedure: CYSTOSCOPY FLEXIBLE;  Surgeon: Bjorn Pippin, MD;  Location: [facility];  Service: Urology;  Laterality: N/A;  . EPIDIDYMECTOMY Left 11/09/2013   Procedure:  LEFT EPIDIDYMECTOMY;  Surgeon: Bjorn Pippin, MD;  [facility]: [facility];  Service: Urology;  Laterality: Left;  POSSIBLE OUTPATIENT WITH OBSERVATION  . EXCISION LIPOMA LEFT SHOULDER  2004 (APPROX)  . MULTIPLE CYST REMOVED FROM CHEST  AGE 3  . OTHER SURGICAL HISTORY     hemorroid surgery   . TESTICLE REMOVAL Left   . TONSILLECTOMY      No family history on file.  Social History   Tobacco Use  . Smoking status: Former Smoker    Packs/day: 0.25    Years: 15.00    Pack years: 3.75    Types: Cigarettes    Last attempt to quit: 11/08/1992    Years since quitting: 25.3  . Smokeless tobacco: Never Used  Substance Use Topics  . Alcohol use: No  . Drug use: No    Prior to Admission medications   Medication Sig Start Date End Date Taking? Authorizing Provider  amitriptyline (ELAVIL) 25 MG tablet Take 25 mg by mouth at bedtime.    [provider]  atorvastatin (LIPITOR) 80 MG tablet Take 80 mg by mouth daily.    [provider]  benzonatate (TESSALON) 100 MG capsule Take 1 capsule (100 mg total) by mouth every 8 (eight) hours. 08/06/17   Rolland Porter, MD  carisoprodol (SOMA) 350 MG tablet Take 1 tablet (350 mg total) 3 (three) times daily as needed by mouth for muscle spasms. 10/13/17   Loren Racer, MD  clindamycin (CLEOCIN) 150 MG capsule Take 1 capsule (150 mg total) by mouth every 6 (six) hours. 03/03/18  Gwyneth Sprout, MD  clotrimazole (LOTRIMIN) 1 % cream Apply to affected area 2 times daily until it is resolved. 11/26/17   Alvira Monday, MD  fluticasone-salmeterol (ADVAIR HFA) 782-95 MCG/ACT inhaler Inhale 2 puffs into the lungs daily.    [provider]  gabapentin (NEURONTIN) 300 MG capsule Take 300 mg by mouth 2 (two) times daily.     [provider]  insulin glargine (LANTUS) 100 UNIT/ML injection Inject 45 Units into the skin at bedtime.     [provider]  insulin lispro (HUMALOG) 100 UNIT/ML injection Inject 6 Units into the skin 3 (three) times daily before meals. Sliding scale    [provider]  levETIRAcetam (KEPPRA) 750 MG tablet Take 1 tablet (750 mg total) by mouth 2 (two) times daily. 07/05/17   Lonia Blood, MD  losartan (COZAAR) 50 MG tablet Take 50 mg by mouth daily.     [provider]  metFORMIN (GLUCOPHAGE) 500 MG tablet Take 1,000 mg by mouth 2 (two) times daily with a meal.     [provider]  nortriptyline (PAMELOR) 50 MG capsule Take 50 mg at bedtime by mouth.    [provider]  promethazine (PHENERGAN) 25 MG tablet Take 1 tablet (25 mg total) by mouth every 6 (six) hours as needed for nausea or vomiting. 05/04/17   Vanetta Mulders, MD  promethazine-dextromethorphan (PROMETHAZINE-DM) 6.25-15 MG/5ML syrup Take 5 mLs by mouth 4 (four) times daily as needed for cough. 08/06/17   Rolland Porter, MD  rizatriptan (MAXALT) 10 MG tablet Take 10 mg by mouth as needed for migraine. May repeat in 2 hours if needed    [provider]  sucralfate (CARAFATE) 1 GM/10ML suspension Take 10 mLs (1 g total) by mouth 4 (four) times daily -  with meals and at bedtime. 06/09/17   Palumbo, April, MD    Allergies Nsaids; Tramadol; Amoxicillin; Ampicillin; Dilantin [phenytoin]; Penicillins; Risperidone and related; Strawberry extract; Bactrim [sulfamethoxazole-trimethoprim]; Depakote [divalproex sodium]; Flexeril [cyclobenzaprine]; Ultram [tramadol hcl]; and Oatmeal   REVIEW OF SYSTEMS  Negative except as noted here or in the History of Present Illness.   PHYSICAL EXAMINATION  Initial Vital Signs Blood pressure (!) 119/101, pulse 98, temperature 97.9 F (36.6 C), temperature source Oral, resp. rate 20, height 5\' 7"  (1.702 m), weight 97.1 kg (214 lb), SpO2 94 %.  Examination General: Well-developed, well-nourished male in no acute distress; appearance consistent with age of record HENT: normocephalic; atraumatic Eyes: Normal appearance Neck: supple Heart: regular rate and rhythm Lungs: clear to auscultation bilaterally Abdomen: soft; nondistended; nontender; bowel sounds present Extremities: No deformity; full range of motion; erythema and tenderness of the pad of the right middle finger without significant induration, sensation intact  distally and brisk capillary refill; tenderness of dorsal right middle finger and hand proximal to the incision without erythema or swelling Neurologic: Awake, alert and oriented; motor function intact in all extremities and symmetric; no facial droop Skin: Warm and dry Psychiatric: Normal mood and affect   RESULTS  Summary of this visit's results, reviewed by myself:   EKG Interpretation  Date/Time:    Ventricular Rate:    PR Interval:    QRS Duration:   QT Interval:    QTC Calculation:   R Axis:     Text Interpretation:        Laboratory Studies: No results found for this or any previous visit (from the past 24 hour(s)). Imaging Studies: No results found.  ED COURSE  Nursing notes  and initial vitals signs, including pulse oximetry, reviewed.  Vitals:   03/05/18 0447 03/05/18 0448 03/05/18 0449 03/05/18 0542  BP:   (!) 119/101 120/85  Pulse: 98  98 87  Resp: 20  20 18   Temp: 97.9 F (36.6 C)  97.9 F (36.6 C)   TempSrc: Oral  Oral   SpO2: 94%  94% 91%  Weight:  97.1 kg (214 lb)    Height:  5\' 7"  (1.702 m)     On reexamination as described below the fell and appears to have been adequately incised and drained on previous visit.  There was no evidence of reaccumulation of pus on my examination although the patient reports purulent drainage earlier this morning.  He was advised to continue antibiotics.  No narcotic prescriptions written as patient is already on pain management.  PROCEDURES   INCISION AND DRAINAGE Performed by: Paula LibraMOLPUS,Maelynn Moroney L Consent: Verbal consent obtained. Risks and benefits: risks, benefits and alternatives were discussed Type: Felon  Body area: Right middle finger  Anesthesia: Digital block  Local anesthetic: Bupivacaine 0.5 % without epinephrine  Anesthetic total: 5 ml  Re-incision at the same site was made with a scalpel. Wound explored, previous I&D found to have adequately broken up septae.  Drainage: Sanguinous without  pus  Packing material: None  Patient tolerance: Patient tolerated the procedure well with no immediate complications.    ED DIAGNOSES     ICD-10-CM   1. Felon of finger of right hand L03.011   2. Encounter for recheck of abscess following incision and drainage Z09        Aeden Matranga, Jonny RuizJohn, MD 03/05/18 [phone]

## 2018-03-06 ENCOUNTER — Emergency Department (HOSPITAL_BASED_OUTPATIENT_CLINIC_OR_DEPARTMENT_OTHER): Payer: Worker's Compensation

## 2018-03-06 ENCOUNTER — Other Ambulatory Visit: Payer: Self-pay

## 2018-03-06 ENCOUNTER — Emergency Department (HOSPITAL_BASED_OUTPATIENT_CLINIC_OR_DEPARTMENT_OTHER)
Admission: EM | Admit: 2018-03-06 | Discharge: 2018-03-06 | Disposition: A | Discharge status: Home / Self Care | Payer: Worker's Compensation | Source: Home / Self Care | Attending: Emergency Medicine | Admitting: Emergency Medicine

## 2018-03-06 DIAGNOSIS — L03011 Cellulitis of right finger: Secondary | ICD-10-CM

## 2018-03-06 LAB — CBC WITH DIFFERENTIAL/PLATELET
Basophils Absolute: 0 10*3/uL (ref 0.0–0.1)
Basophils Relative: 1 %
Eosinophils Absolute: 0.1 10*3/uL (ref 0.0–0.7)
Eosinophils Relative: 2 %
HCT: 39.6 % (ref 39.0–52.0)
Hemoglobin: 13.4 g/dL (ref 13.0–17.0)
Lymphocytes Relative: 35 %
Lymphs Abs: 2.2 10*3/uL (ref 0.7–4.0)
MCH: 28.2 pg (ref 26.0–34.0)
MCHC: 33.8 g/dL (ref 30.0–36.0)
MCV: 83.4 fL (ref 78.0–100.0)
Monocytes Absolute: 0.6 10*3/uL (ref 0.1–1.0)
Monocytes Relative: 9 %
Neutro Abs: 3.5 10*3/uL (ref 1.7–7.7)
Neutrophils Relative %: 55 %
Platelets: 166 10*3/uL (ref 150–400)
RBC: 4.75 MIL/uL (ref 4.22–5.81)
RDW: 14.7 % (ref 11.5–15.5)
WBC: 6.5 10*3/uL (ref 4.0–10.5)

## 2018-03-06 LAB — BASIC METABOLIC PANEL
Anion gap: 10 (ref 5–15)
BUN: 19 mg/dL (ref 6–20)
CO2: 23 mmol/L (ref 22–32)
Calcium: 9.7 mg/dL (ref 8.9–10.3)
Chloride: 103 mmol/L (ref 101–111)
Creatinine, Ser: 1.21 mg/dL (ref 0.61–1.24)
GFR calc Af Amer: >60 mL/min (ref >60)
GFR calc non Af Amer: >60 mL/min (ref >60)
Glucose, Bld: 241 mg/dL — ABNORMAL HIGH (ref 65–99)
Potassium: 4.1 mmol/L (ref 3.5–5.1)
Sodium: 136 mmol/L (ref 135–145)

## 2018-03-06 MED ORDER — HYDROCODONE-ACETAMINOPHEN 5-325 MG PO TABS
2.0000 | ORAL_TABLET | Freq: Once | ORAL | Status: AC
  Administered 2018-03-06: 2 via ORAL
  Filled 2018-03-06: qty 2

## 2018-03-06 NOTE — Discharge Instructions (Addendum)
Continue the warm soaks of the finger, please continue antibiotics, please control your blood sugar, you can continue your home pain medicine

## 2018-03-06 NOTE — ED Provider Notes (Signed)
[facility] EMERGENCY DEPARTMENT Provider Note   CSN: [id number] Arrival date & time: 03/05/18  2154     History   Chief Complaint Chief Complaint  Patient presents with  . Wound Check    HPI Nicholas [name] is a 57 y.o. male.  The history is provided by the patient.  Hand Pain  This is a recurrent problem. The current episode started more than 2 days ago. The problem occurs daily. The problem has been gradually worsening. Pertinent negatives include no chest pain and no shortness of breath. Exacerbated by: palpation. Nothing relieves the symptoms. Treatments tried: antiBiotics. The treatment provided no relief.  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  He also reports pain on his hand and his forearm. Past Medical History:  Diagnosis Date  . Asthma   . Bipolar 1 disorder (HCC)   . Borderline glaucoma   . Epididymal pain    LEFT  . 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 Feeling of incomplete bladder emptying   . Frequency of urination   . Gastric ulcer   . GERD (gastroesophageal reflux disease)   . Hypertension   . Hyperthyroidism    NO MEDS   . Migraine   . Seizures (HCC)   . Type 2 diabetes mellitus [facility])     Patient Active Problem List   Diagnosis Date Noted  . Essential hypertension   . Seizure disorder (HCC) 07/02/2017  . Insulin-requiring or dependent type II diabetes mellitus (HCC) 07/02/2017  . CKD (chronic kidney  disease), stage II 07/02/2017  . Normocytic anemia 07/02/2017  . Testicular/scrotal pain 11/09/2013  . Microhematuria 11/09/2013  . Condyloma acuminatum of scrotum 11/09/2013    Past Surgical History:  Procedure Laterality Date  . ANTERIOR CERVICAL DECOMP/DISCECTOMY FUSION  2007   C4  --  C6  . CERVICAL FUSION    . CHOLECYSTECTOMY    . CYSTOSCOPY N/A 11/09/2013   Procedure: CYSTOSCOPY FLEXIBLE;  Surgeon: Bjorn Pippin, MD;  Location: [facility];  Service: Urology;  Laterality: N/A;  . EPIDIDYMECTOMY Left 11/09/2013   Procedure:  LEFT EPIDIDYMECTOMY;  Surgeon: Bjorn Pippin, MD;  Location: [facility];  Service: Urology;  Laterality: Left;  POSSIBLE OUTPATIENT WITH OBSERVATION  . EXCISION LIPOMA LEFT SHOULDER  2004 (APPROX)  . MULTIPLE CYST REMOVED FROM CHEST  AGE 53  . OTHER SURGICAL HISTORY     hemorroid surgery   . TESTICLE REMOVAL Left   . TONSILLECTOMY          Home Medications    Prior to Admission medications   Medication Sig Start Date End Date Taking? Authorizing Provider  amitriptyline (ELAVIL) 25 MG tablet Take 25 mg by mouth at bedtime.   Yes [provider]  atorvastatin (LIPITOR) 80 MG tablet Take 80 mg by mouth daily.   Yes [provider]  clindamycin (CLEOCIN) 150 MG capsule Take  1 capsule (150 mg total) by mouth every 6 (six) hours. 03/03/18  Yes Gwyneth Sprout, MD  fluticasone-salmeterol (ADVAIR HFA) 130-86 MCG/ACT inhaler Inhale 2 puffs into the lungs daily.   Yes [provider]  gabapentin (NEURONTIN) 300 MG capsule Take 300 mg by mouth 2 (two) times daily.    Yes [provider]  insulin glargine (LANTUS) 100 UNIT/ML injection Inject 45 Units into the skin at bedtime.    Yes [provider]  insulin lispro (HUMALOG) 100 UNIT/ML injection Inject 6 Units into the skin 3 (three) times daily before meals. Sliding scale   Yes [provider]  levETIRAcetam (KEPPRA) 750 MG tablet Take  1 tablet (750 mg total) by mouth 2 (two) times daily. 07/05/17  Yes Lonia Blood, MD  losartan (COZAAR) 50 MG tablet Take 50 mg by mouth daily.   Yes [provider]  metFORMIN (GLUCOPHAGE) 500 MG tablet Take 1,000 mg by mouth 2 (two) times daily with a meal.    Yes [provider]  nortriptyline (PAMELOR) 50 MG capsule Take 50 mg at bedtime by mouth.   Yes [provider]  rizatriptan (MAXALT) 10 MG tablet Take 10 mg by mouth as needed for migraine. May repeat in 2 hours if needed   Yes [provider]  benzonatate (TESSALON) 100 MG capsule Take 1 capsule (100 mg total) by mouth every 8 (eight) hours. 08/06/17   Rolland Porter, MD  carisoprodol (SOMA) 350 MG tablet Take 1 tablet (350 mg total) 3 (three) times daily as needed by mouth for muscle spasms. 10/13/17   Loren Racer, MD  clotrimazole (LOTRIMIN) 1 % cream Apply to affected area 2 times daily until it is resolved. 11/26/17   Alvira Monday, MD  promethazine (PHENERGAN) 25 MG tablet Take 1 tablet (25 mg total) by mouth every 6 (six) hours as needed for nausea or vomiting. 05/04/17   Vanetta Mulders, MD  promethazine-dextromethorphan (PROMETHAZINE-DM) 6.25-15 MG/5ML syrup Take 5 mLs by mouth 4 (four) times daily as needed for cough. 08/06/17   Rolland Porter, MD  sucralfate (CARAFATE) 1 GM/10ML suspension Take 10 mLs (1 g total) by mouth 4 (four) times daily -  with meals and at bedtime. 06/09/17   Palumbo, April, MD    Family History No family history on file.  Social History Social History   Tobacco Use  . Smoking status: Former Smoker    Packs/day: 0.25    Years: 15.00    Pack years: 3.75    Types: Cigarettes    Last attempt to quit: 11/08/1992    Years since quitting: 25.3  . Smokeless tobacco: Never Used  Substance Use Topics  . Alcohol use: No  . Drug use: No     Allergies   Nsaids; Tramadol; Amoxicillin; Ampicillin; Dilantin [phenytoin]; Penicillins; Risperidone and related;  Strawberry extract; Bactrim [sulfamethoxazole-trimethoprim]; Depakote [divalproex sodium]; Flexeril [cyclobenzaprine]; Ultram [tramadol hcl]; and Oatmeal   Review of Systems Review of Systems  Constitutional: Positive for fever.  Respiratory: Negative for shortness of breath.   Cardiovascular: Negative for chest pain.  All other systems reviewed and are negative.    Physical Exam Updated Vital Signs BP (!) 141/95 (BP Location: Right Arm)   Pulse 88   Temp 98.6 F (37 C) (Oral)   Resp 20   Ht 1.702 m (5\' 7" )   Wt 97.1 kg (214 lb)   SpO2 98%   BMI 33.52 kg/m   Physical Exam CONSTITUTIONAL: Well developed/well nourished anxious HEAD: Normocephalic/atraumatic EYES:  EOMI ENMT: Mucous membranes moist NECK: supple no meningeal signs CV: S1/S2 noted, no murmurs/rubs/gallops noted LUNGS: Lungs are clear to auscultation bilaterally, no apparent distress ABDOMEN: soft NEURO: Pt is awake/alert/appropriate, moves all extremitiesx4.  No facial droop.   EXTREMITIES: pulses normal/equal, full ROM No crepitus to finger.  Significant tenderness with range of motion of right middle finger. No streaking proximally to hand or forearm See Photo SKIN: warm, color normal, felon to the right middle finger PSYCH: Anxious    Patient gave verbal permission to utilize photo for medical documentation only The image was not stored on any personal device ED Treatments / Results  Labs (all labs ordered are listed, but only abnormal results are displayed) Labs Reviewed  BASIC METABOLIC PANEL - Abnormal; Notable for the following components:      Result Value   Glucose, Bld 241 (*)    All other components within normal limits  CBC WITH DIFFERENTIAL/PLATELET    EKG None  Radiology Dg Hand Complete Right  Result Date: 03/06/2018 CLINICAL DATA:  Pain and swelling since blunt trauma March 14th. History of finger drainage. EXAM: RIGHT HAND - COMPLETE 3+ VIEW COMPARISON:  RIGHT finger  radiograph February 17, 2018 FINDINGS: There is no evidence of fracture or dislocation. There is no evidence of arthropathy or other focal bone abnormality. Soft tissues are unremarkable. IMPRESSION: Negative. Electronically Signed   By: Awilda Metroourtnay  Bloomer M.D.   On: 03/06/2018 03:58    Procedures Procedures (including critical care time)  Medications Ordered in ED Medications  HYDROcodone-acetaminophen (NORCO/VICODIN) 5-325 MG per tablet 2 tablet (2 tablets Oral Given 03/06/18 0407)     Initial Impression / Assessment and Plan / ED Course  I have reviewed the triage vital signs and the nursing notes.  Pertinent labs & imaging results that were available during my care of the patient were reviewed by me and considered in my medical decision making (see chart for details).     He reports he called his hand surgeon, was told to go back to the ER for an x-ray.  We will consult hand surgery 4:30 AM D/w dr Jerl Santosdalldorf on call for dr Timoteo Exposethompson Long discussion with Dr. Yisroel Rammingaldorf about need for definitive management by hand surgery We discussed imaging, labs and exam He recommends warm soaks, continue antibiotics and see dr Janee Mornthompson in 24 hours Patient appears improved.  There is no crepitus.  Has better range of motion of right middle finger.  No signs of tenosynovitis Patient is already had 2 I&D's in the emergency department, I do feel the definitive managed by hand surgery is warranted.  He can be seen in 24 hours.  He will continue warm soaks and antibiotics. No fever, no elevated white count, leukosis improved per patient.  He is not septic appearing Discussed Strict return precautions Final Clinical Impressions(s) / ED Diagnoses   Final diagnoses:  Felon of finger of right hand    ED Discharge Orders    None       Zadie RhineWickline, Shaindy Reader, MD 03/06/18 [phone]

## 2018-03-06 NOTE — ED Notes (Signed)
Patient transported to X-ray 

## 2018-03-06 NOTE — ED Notes (Signed)
Family at bedside. 

## 2018-03-06 NOTE — ED Notes (Signed)
ED Provider at bedside. 

## 2018-03-25 ENCOUNTER — Other Ambulatory Visit: Payer: Self-pay

## 2018-03-25 ENCOUNTER — Encounter (HOSPITAL_BASED_OUTPATIENT_CLINIC_OR_DEPARTMENT_OTHER): Payer: Self-pay | Admitting: *Deleted

## 2018-03-25 DIAGNOSIS — Z87891 Personal history of nicotine dependence: Secondary | ICD-10-CM | POA: Diagnosis not present

## 2018-03-25 DIAGNOSIS — E1122 Type 2 diabetes mellitus with diabetic chronic kidney disease: Secondary | ICD-10-CM | POA: Diagnosis not present

## 2018-03-25 DIAGNOSIS — B370 Candidal stomatitis: Secondary | ICD-10-CM | POA: Insufficient documentation

## 2018-03-25 DIAGNOSIS — Z79899 Other long term (current) drug therapy: Secondary | ICD-10-CM | POA: Insufficient documentation

## 2018-03-25 DIAGNOSIS — J45909 Unspecified asthma, uncomplicated: Secondary | ICD-10-CM | POA: Insufficient documentation

## 2018-03-25 DIAGNOSIS — N182 Chronic kidney disease, stage 2 (mild): Secondary | ICD-10-CM | POA: Insufficient documentation

## 2018-03-25 DIAGNOSIS — B379 Candidiasis, unspecified: Secondary | ICD-10-CM | POA: Diagnosis present

## 2018-03-25 DIAGNOSIS — I129 Hypertensive chronic kidney disease with stage 1 through stage 4 chronic kidney disease, or unspecified chronic kidney disease: Secondary | ICD-10-CM | POA: Insufficient documentation

## 2018-03-25 DIAGNOSIS — Z794 Long term (current) use of insulin: Secondary | ICD-10-CM | POA: Insufficient documentation

## 2018-03-25 NOTE — ED Triage Notes (Signed)
Thrush x 2 weeks. Started after taking Amoxicillin.

## 2018-03-26 ENCOUNTER — Emergency Department (HOSPITAL_BASED_OUTPATIENT_CLINIC_OR_DEPARTMENT_OTHER)
Admission: EM | Admit: 2018-03-26 | Discharge: 2018-03-26 | Disposition: A | Discharge status: Home / Self Care | Payer: Medicare Other | Attending: Emergency Medicine | Admitting: Emergency Medicine

## 2018-03-26 DIAGNOSIS — B370 Candidal stomatitis: Secondary | ICD-10-CM

## 2018-03-26 LAB — CBG MONITORING, ED: Glucose-Capillary: 189 mg/dL — ABNORMAL HIGH (ref 65–99)

## 2018-03-26 LAB — RAPID STREP SCREEN (MED CTR MEBANE ONLY): Streptococcus, Group A Screen (Direct): NEGATIVE

## 2018-03-26 MED ORDER — NYSTATIN 100000 UNIT/ML MT SUSP: 500000.0000 [IU] | Freq: Four times a day (QID) | OROMUCOSAL | 0 refills | Status: AC

## 2018-03-26 MED ORDER — NYSTATIN 100000 UNIT/ML MT SUSP
5.0000 mL | Freq: Once | OROMUCOSAL | Status: DC
  Filled 2018-03-26: qty 5

## 2018-03-26 MED ORDER — LIDOCAINE VISCOUS 2 % MT SOLN
15.0000 mL | Freq: Once | OROMUCOSAL | Status: AC
  Administered 2018-03-26: 15 mL via OROMUCOSAL
  Filled 2018-03-26: qty 15

## 2018-03-26 MED ORDER — LIDOCAINE VISCOUS 2 % MT SOLN: 15.0000 mL | OROMUCOSAL | 0 refills | Status: DC | PRN

## 2018-03-26 NOTE — Discharge Instructions (Addendum)
Take the medication as prescribed.  Follow-up with your doctor.  Return to the ED if you develop new or worsening symptoms.

## 2018-03-26 NOTE — ED Provider Notes (Signed)
[facility] EMERGENCY DEPARTMENT Provider Note   CSN: [id number] Arrival date & time: 03/25/18  2243     History   Chief Complaint Chief Complaint  Patient presents with  . Thrush    HPI Nicholas [name] is a 57 y.o. male.  Patient with 2 weeks of pain in his mouth and throat pain with swallowing that became acutely worse tonight when trying to drink water and soda.  Describes feeling of "slashes" on his tongue and pain with swallowing.  Denies any drooling, difficulty breathing, chest pain.  Patient was on antibiotics for a hand infection and finished those about 8 days ago.  He states this is happened before when he takes penicillin.  He states the finger is improving.  He denies any fevers, chills, nausea or vomiting.  Vision is a diabetic but denies any history of HIV.  The history is provided by the patient.    Past Medical History:  Diagnosis Date  . Asthma   . Bipolar 1 disorder (HCC)   . Borderline glaucoma   . Epididymal pain    LEFT  . 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 Feeling of incomplete bladder emptying   . Frequency of urination   . Gastric ulcer   . GERD (gastroesophageal reflux disease)   . Hypertension   . Hyperthyroidism    NO MEDS   . Migraine   . Seizures (HCC)   . Type 2 diabetes mellitus [facility])     Patient Active Problem List   Diagnosis Date Noted  . Essential hypertension   . Seizure disorder (HCC) 07/02/2017  . Insulin-requiring or dependent type II diabetes mellitus (HCC) 07/02/2017  . CKD (chronic kidney disease), stage II 07/02/2017  . Normocytic anemia 07/02/2017  . Testicular/scrotal pain 11/09/2013  . Microhematuria 11/09/2013  . Condyloma acuminatum of scrotum 11/09/2013    Past Surgical History:  Procedure Laterality Date  . ANTERIOR CERVICAL DECOMP/DISCECTOMY FUSION  2007   C4  --  C6  . CERVICAL FUSION    .  CHOLECYSTECTOMY    . CYSTOSCOPY N/A 11/09/2013   Procedure: CYSTOSCOPY FLEXIBLE;  Surgeon: Bjorn Pippin, MD;  Location: [facility];  Service: Urology;  Laterality: N/A;  . EPIDIDYMECTOMY Left 11/09/2013   Procedure:  LEFT EPIDIDYMECTOMY;  Surgeon: Bjorn Pippin, MD;  Location[facility]  Service: Urology;  Laterality: Left;  POSSIBLE OUTPATIENT WITH OBSERVATION  . EXCISION LIPOMA LEFT SHOULDER  2004 (APPROX)  . MULTIPLE CYST REMOVED FROM CHEST  AGE 9  . OTHER SURGICAL HISTORY     hemorroid surgery   . TESTICLE REMOVAL Left   . TONSILLECTOMY          Home Medications    Prior to Admission medications   Medication Sig Start Date End Date Taking? Authorizing Provider  amitriptyline (ELAVIL) 25 MG tablet Take 25 mg by mouth at bedtime.    [provider]  atorvastatin (LIPITOR) 80 MG tablet Take 80 mg by mouth daily.    [provider]  benzonatate (TESSALON) 100 MG capsule Take 1 capsule (100 mg total) by mouth every 8 (eight) hours. 08/06/17   Rolland Porter, MD  carisoprodol (SOMA) 350 MG tablet Take 1 tablet (350 mg total) 3 (three) times daily as needed by mouth for muscle spasms. 10/13/17   Loren Racer, MD  clindamycin (CLEOCIN) 150  MG capsule Take 1 capsule (150 mg total) by mouth every 6 (six) hours. 03/03/18   Gwyneth SproutPlunkett, Whitney, MD  clotrimazole (LOTRIMIN) 1 % cream Apply to affected area 2 times daily until it is resolved. 11/26/17   Alvira MondaySchlossman, Erin, MD  fluticasone-salmeterol (ADVAIR HFA) 161-09115-21 MCG/ACT inhaler Inhale 2 puffs into the lungs daily.    [provider]  gabapentin (NEURONTIN) 300 MG capsule Take 300 mg by mouth 2 (two) times daily.     [provider]  insulin glargine (LANTUS) 100 UNIT/ML injection Inject 45 Units into the skin at bedtime.     [provider]  insulin lispro (HUMALOG) 100 UNIT/ML injection Inject 6 Units into the skin 3 (three) times daily before meals. Sliding scale     [provider]  levETIRAcetam (KEPPRA) 750 MG tablet Take 1 tablet (750 mg total) by mouth 2 (two) times daily. 07/05/17   Lonia BloodMcClung, Jeffrey T, MD  losartan (COZAAR) 50 MG tablet Take 50 mg by mouth daily.    [provider]  metFORMIN (GLUCOPHAGE) 500 MG tablet Take 1,000 mg by mouth 2 (two) times daily with a meal.     [provider]  nortriptyline (PAMELOR) 50 MG capsule Take 50 mg at bedtime by mouth.    [provider]  promethazine (PHENERGAN) 25 MG tablet Take 1 tablet (25 mg total) by mouth every 6 (six) hours as needed for nausea or vomiting. 05/04/17   Vanetta MuldersZackowski, Scott, MD  promethazine-dextromethorphan (PROMETHAZINE-DM) 6.25-15 MG/5ML syrup Take 5 mLs by mouth 4 (four) times daily as needed for cough. 08/06/17   Rolland PorterJames, Mark, MD  rizatriptan (MAXALT) 10 MG tablet Take 10 mg by mouth as needed for migraine. May repeat in 2 hours if needed    [provider]  sucralfate (CARAFATE) 1 GM/10ML suspension Take 10 mLs (1 g total) by mouth 4 (four) times daily -  with meals and at bedtime. 06/09/17   Palumbo, April, MD    Family History No family history on file.  Social History Social History   Tobacco Use  . Smoking status: Former Smoker    Packs/day: 0.25    Years: 15.00    Pack years: 3.75    Types: Cigarettes    Last attempt to quit: 11/08/1992    Years since quitting: 25.3  . Smokeless tobacco: Never Used  Substance Use Topics  . Alcohol use: No  . Drug use: No     Allergies   Nsaids; Tramadol; Amoxicillin; Ampicillin; Dilantin [phenytoin]; Penicillins; Risperidone and related; Strawberry extract; Bactrim [sulfamethoxazole-trimethoprim]; Depakote [divalproex sodium]; Flexeril [cyclobenzaprine]; Ultram [tramadol hcl]; and Oatmeal   Review of Systems Review of Systems  Constitutional: Negative for activity change, appetite change and fever.  HENT: Positive for mouth sores and sore throat.   Eyes: Negative for visual disturbance.    Respiratory: Negative for cough, chest tightness and shortness of breath.   Cardiovascular: Negative for chest pain.  Gastrointestinal: Negative for abdominal pain, nausea and vomiting.  Genitourinary: Negative for dysuria, hematuria and testicular pain.  Musculoskeletal: Negative for arthralgias and myalgias.  Skin: Positive for wound.  Neurological: Negative for dizziness, weakness and headaches.   all other systems are negative except as noted in the HPI and PMH.     Physical Exam Updated Vital Signs BP 137/87   Pulse 97   Temp 98 F (36.7 C) (Oral)   Resp 18   Ht 5\' 7"  (1.702 m)   Wt 97.1 kg (214 lb)   SpO2 98%  BMI 33.52 kg/m   Physical Exam  Constitutional: He is oriented to person, place, and time. He appears well-developed and well-nourished. No distress.  Speaking full sentences, no distress, no drooling, no stridor or trismus  HENT:  Head: Normocephalic and atraumatic.  Mouth/Throat: Oropharynx is clear and moist. No oropharyngeal exudate.  Thrush present, floor of mouth soft, no tongue swelling, no uvula swelling  Eyes: Pupils are equal, round, and reactive to light. Conjunctivae and EOM are normal.  Neck: Normal range of motion. Neck supple.  No meningismus.  Cardiovascular: Normal rate, regular rhythm, normal heart sounds and intact distal pulses.  No murmur heard. Pulmonary/Chest: Effort normal and breath sounds normal. No respiratory distress.  Abdominal: Soft. There is no tenderness. There is no rebound and no guarding.  Musculoskeletal: Normal range of motion. He exhibits no edema or tenderness.  Neurological: He is alert and oriented to person, place, and time. No cranial nerve deficit. He exhibits normal muscle tone. Coordination normal.  No ataxia on finger to nose bilaterally. No pronator drift. 5/5 strength throughout. CN 2-12 intact.Equal grip strength. Sensation intact.   Skin: Skin is warm.  Psychiatric: He has a normal mood and affect. His  behavior is normal.  Nursing note and vitals reviewed.    ED Treatments / Results  Labs (all labs ordered are listed, but only abnormal results are displayed) Labs Reviewed  CBG MONITORING, ED - Abnormal; Notable for the following components:      Result Value   Glucose-Capillary 189 (*)    All other components within normal limits  RAPID STREP SCREEN ([facility] ONLY)  CULTURE, GROUP A STREP [facility])    EKG None  Radiology No results found.  Procedures Procedures (including critical care time)  Medications Ordered in ED Medications  lidocaine (XYLOCAINE) 2 % viscous mouth solution 15 mL (has no administration in time range)  nystatin (MYCOSTATIN) 100000 UNIT/ML suspension 500,000 Units (has no administration in time range)     Initial Impression / Assessment and Plan / ED Course  I have reviewed the triage vital signs and the nursing notes.  Pertinent labs & imaging results that were available during my care of the patient were reviewed by me and considered in my medical decision making (see chart for details).    Patient with apparent thrush after antibiotic use.  No airway issues.  Patient will be treated with nystatin as well as viscous lidocaine. CBG not severely elevated.  Suspect thrush due to recent antibiotic use.  Patient is tolerating p.o. and breathing comfortably.  Will treat with p.o. nystatin.  PCP follow-up discussed ED return precautions discussed.  Final Clinical Impressions(s) / ED Diagnoses   Final diagnoses:  [facility]    ED Discharge Orders    None       Glynn Octave, MD 03/26/18 [phone]

## 2018-03-28 LAB — CULTURE, GROUP A STREP (THRC)

## 2018-03-31 ENCOUNTER — Emergency Department (HOSPITAL_BASED_OUTPATIENT_CLINIC_OR_DEPARTMENT_OTHER)
Admission: EM | Admit: 2018-03-31 | Discharge: 2018-03-31 | Disposition: A | Discharge status: Home / Self Care | Payer: Medicare Other | Attending: Emergency Medicine | Admitting: Emergency Medicine

## 2018-03-31 ENCOUNTER — Other Ambulatory Visit: Payer: Self-pay

## 2018-03-31 ENCOUNTER — Encounter (HOSPITAL_BASED_OUTPATIENT_CLINIC_OR_DEPARTMENT_OTHER): Payer: Self-pay

## 2018-03-31 DIAGNOSIS — R070 Pain in throat: Secondary | ICD-10-CM | POA: Diagnosis not present

## 2018-03-31 DIAGNOSIS — Z794 Long term (current) use of insulin: Secondary | ICD-10-CM | POA: Diagnosis not present

## 2018-03-31 DIAGNOSIS — J45909 Unspecified asthma, uncomplicated: Secondary | ICD-10-CM | POA: Diagnosis not present

## 2018-03-31 DIAGNOSIS — Z79899 Other long term (current) drug therapy: Secondary | ICD-10-CM | POA: Insufficient documentation

## 2018-03-31 DIAGNOSIS — I10 Essential (primary) hypertension: Secondary | ICD-10-CM | POA: Diagnosis not present

## 2018-03-31 DIAGNOSIS — E119 Type 2 diabetes mellitus without complications: Secondary | ICD-10-CM | POA: Insufficient documentation

## 2018-03-31 DIAGNOSIS — Z87891 Personal history of nicotine dependence: Secondary | ICD-10-CM | POA: Insufficient documentation

## 2018-03-31 DIAGNOSIS — H66001 Acute suppurative otitis media without spontaneous rupture of ear drum, right ear: Secondary | ICD-10-CM | POA: Diagnosis not present

## 2018-03-31 LAB — CBG MONITORING, ED: Glucose-Capillary: 244 mg/dL — ABNORMAL HIGH (ref 65–99)

## 2018-03-31 MED ORDER — AZITHROMYCIN 250 MG PO TABS
1000.0000 mg | ORAL_TABLET | Freq: Once | ORAL | Status: AC
  Administered 2018-03-31: 1000 mg via ORAL
  Filled 2018-03-31: qty 4

## 2018-03-31 MED ORDER — AZITHROMYCIN 250 MG PO TABS: 250.0000 mg | ORAL_TABLET | Freq: Every day | ORAL | 0 refills | Status: DC

## 2018-03-31 MED FILL — AZITHROMYCIN 250 MG TABLET: 250 | 4 days supply | Qty: 4 | Fill #0

## 2018-03-31 NOTE — Discharge Instructions (Addendum)
Follow up with your PCP.  Return for worsening symptoms.   Keep your blood sugar as controlled as you can.

## 2018-03-31 NOTE — ED Triage Notes (Signed)
Pt c/o cont'd mouth, tongue pain-states he was treated for thrush recently-NAD-steady gait

## 2018-03-31 NOTE — ED Provider Notes (Signed)
[facility] EMERGENCY DEPARTMENT Provider Note   CSN: [id number] Arrival date & time: 03/31/18  1527     History   Chief Complaint Chief Complaint  Patient presents with  . Oral Pain    HPI CALIB WADHWA is a 57 y.o. male.  57 yo M with a chief complaint of right-sided neck pain.  This been going on for the past couple days.  The patient was recently seen here and diagnosed with thrush.  He has been on therapy for that has had significant improvement however he has had a worsening cough congestion and right-sided sore throat.  He had a fever a couple days ago it has resolved.  Has been eating and drinking without difficulty.  Denies shortness of breath denies chest pain.  The history is provided by the patient.  Oral Pain  Pertinent negatives include no chest pain, no abdominal pain, no headaches and no shortness of breath.  Illness  This is a new problem. The current episode started 2 days ago. The problem occurs constantly. The problem has been gradually worsening. Pertinent negatives include no chest pain, no abdominal pain, no headaches and no shortness of breath. Nothing aggravates the symptoms. Nothing relieves the symptoms. He has tried nothing for the symptoms. The treatment provided no relief.    Past Medical History:  Diagnosis Date  . Asthma   . Bipolar 1 disorder (HCC)   . Borderline glaucoma   . Epididymal pain    LEFT  . 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 Feeling of incomplete bladder emptying   . Frequency of urination   . Gastric ulcer   . GERD (gastroesophageal reflux disease)   . Hypertension   . Hyperthyroidism    NO MEDS   . Migraine   . Seizures (HCC)   . Type 2 diabetes mellitus [facility])     Patient Active Problem List   Diagnosis Date Noted  . Essential hypertension   . Seizure disorder (HCC) 07/02/2017  . Insulin-requiring or dependent  type II diabetes mellitus (HCC) 07/02/2017  . CKD (chronic kidney disease), stage II 07/02/2017  . Normocytic anemia 07/02/2017  . Testicular/scrotal pain 11/09/2013  . Microhematuria 11/09/2013  . Condyloma acuminatum of scrotum 11/09/2013    Past Surgical History:  Procedure Laterality Date  . ANTERIOR CERVICAL DECOMP/DISCECTOMY FUSION  2007   C4  --  C6  . CERVICAL FUSION    . CHOLECYSTECTOMY    . CYSTOSCOPY N/A 11/09/2013   Procedure: CYSTOSCOPY FLEXIBLE;  Surgeon: Bjorn Pippin, MD;  Location: [facility];  Service: Urology;  Laterality: N/A;  . EPIDIDYMECTOMY Left 11/09/2013   Procedure:  LEFT EPIDIDYMECTOMY;  Surgeon: Bjorn Pippin, MD;  Location: [facility];  Service: Urology;  Laterality: Left;  POSSIBLE OUTPATIENT WITH OBSERVATION  . EXCISION LIPOMA LEFT SHOULDER  2004 (APPROX)  . MULTIPLE CYST REMOVED FROM CHEST  AGE 38  . OTHER SURGICAL HISTORY     hemorroid surgery   . TESTICLE REMOVAL Left   . TONSILLECTOMY          Home Medications    Prior to Admission medications   Medication Sig Start Date End Date Taking? Authorizing Provider  amitriptyline (ELAVIL) 25 MG tablet Take 25 mg by mouth at bedtime.    [provider]  atorvastatin (LIPITOR) 80 MG tablet Take 80 mg  by mouth daily.    [provider]  azithromycin (ZITHROMAX) 250 MG tablet Take 1 tablet (250 mg total) by mouth daily. 03/31/18   Melene Plan, DO  benzonatate (TESSALON) 100 MG capsule Take 1 capsule (100 mg total) by mouth every 8 (eight) hours. 08/06/17   Rolland Porter, MD  carisoprodol (SOMA) 350 MG tablet Take 1 tablet (350 mg total) 3 (three) times daily as needed by mouth for muscle spasms. 10/13/17   Loren Racer, MD  clindamycin (CLEOCIN) 150 MG capsule Take 1 capsule (150 mg total) by mouth every 6 (six) hours. 03/03/18   Gwyneth Sprout, MD  clotrimazole (LOTRIMIN) 1 % cream Apply to affected area 2 times daily until it is resolved. 11/26/17    Alvira Monday, MD  fluticasone-salmeterol (ADVAIR HFA) 161-09 MCG/ACT inhaler Inhale 2 puffs into the lungs daily.    [provider]  gabapentin (NEURONTIN) 300 MG capsule Take 300 mg by mouth 2 (two) times daily.     [provider]  insulin glargine (LANTUS) 100 UNIT/ML injection Inject 45 Units into the skin at bedtime.     [provider]  insulin lispro (HUMALOG) 100 UNIT/ML injection Inject 6 Units into the skin 3 (three) times daily before meals. Sliding scale    [provider]  levETIRAcetam (KEPPRA) 750 MG tablet Take 1 tablet (750 mg total) by mouth 2 (two) times daily. 07/05/17   Lonia Blood, MD  lidocaine (XYLOCAINE) 2 % solution Use as directed 15 mLs in the mouth or throat as needed for mouth pain. 03/26/18   Rancour, Jeannett Senior, MD  losartan (COZAAR) 50 MG tablet Take 50 mg by mouth daily.    [provider]  metFORMIN (GLUCOPHAGE) 500 MG tablet Take 1,000 mg by mouth 2 (two) times daily with a meal.     [provider]  nortriptyline (PAMELOR) 50 MG capsule Take 50 mg at bedtime by mouth.    [provider]  nystatin (MYCOSTATIN) 100000 UNIT/ML suspension Take 5 mLs (500,000 Units total) by mouth 4 (four) times daily for 7 days. 03/26/18 04/02/18  Rancour, Jeannett Senior, MD  promethazine (PHENERGAN) 25 MG tablet Take 1 tablet (25 mg total) by mouth every 6 (six) hours as needed for nausea or vomiting. 05/04/17   Vanetta Mulders, MD  promethazine-dextromethorphan (PROMETHAZINE-DM) 6.25-15 MG/5ML syrup Take 5 mLs by mouth 4 (four) times daily as needed for cough. 08/06/17   Rolland Porter, MD  rizatriptan (MAXALT) 10 MG tablet Take 10 mg by mouth as needed for migraine. May repeat in 2 hours if needed    [provider]  sucralfate (CARAFATE) 1 GM/10ML suspension Take 10 mLs (1 g total) by mouth 4 (four) times daily -  with meals and at bedtime. 06/09/17   Palumbo, April, MD    Family History No family history on  file.  Social History Social History   Tobacco Use  . Smoking status: Former Smoker    Packs/day: 0.25    Years: 15.00    Pack years: 3.75    Types: Cigarettes    Last attempt to quit: 11/08/1992    Years since quitting: 25.4  . Smokeless tobacco: Never Used  Substance Use Topics  . Alcohol use: No  . Drug use: No     Allergies   Nsaids; Tramadol; Amoxicillin; Ampicillin; Dilantin [phenytoin]; Penicillins; Risperidone and related; Strawberry extract; Bactrim [sulfamethoxazole-trimethoprim]; Depakote [divalproex sodium]; Flexeril [cyclobenzaprine]; Ultram [tramadol hcl]; and Oatmeal   Review of Systems Review of Systems  Constitutional:  Positive for fever (2 days ago and now resovled). Negative for chills.  HENT: Positive for congestion. Negative for facial swelling.   Eyes: Negative for discharge and visual disturbance.  Respiratory: Positive for cough. Negative for shortness of breath.   Cardiovascular: Negative for chest pain and palpitations.  Gastrointestinal: Negative for abdominal pain, diarrhea and vomiting.  Musculoskeletal: Negative for arthralgias and myalgias.  Skin: Negative for color change and rash.  Neurological: Negative for tremors, syncope and headaches.  Psychiatric/Behavioral: Negative for confusion and dysphoric mood.     Physical Exam Updated Vital Signs BP (!) 120/97 (BP Location: Left Arm)   Pulse (!) 105   Temp 98.2 F (36.8 C) (Oral)   Resp 20   Ht 5\' 7"  (1.702 m)   Wt 99.5 kg (219 lb 5.7 oz)   SpO2 100%   BMI 34.36 kg/m   Physical Exam  Constitutional: He is oriented to person, place, and time. He appears well-developed and well-nourished.  HENT:  Head: Normocephalic and atraumatic.  Right TM is swollen and has distortion of landmarks.  Serous effusion.Swollen turbinates, posterior nasal drip, no noted sinus ttp, tm normal bilaterally.   Right anterior cervical lymphadenopathy.  No noted erythema to the posterior pharynx.  Uvula is  midline.  Tolerating secretions without difficulty.  Eyes: Pupils are equal, round, and reactive to light. EOM are normal.  Neck: Normal range of motion. Neck supple. No JVD present.  Cardiovascular: Normal rate and regular rhythm. Exam reveals no gallop and no friction rub.  No murmur heard. Pulmonary/Chest: No respiratory distress. He has no wheezes.  Abdominal: He exhibits no distension. There is no rebound and no guarding.  Musculoskeletal: Normal range of motion.  Neurological: He is alert and oriented to person, place, and time.  Skin: No rash noted. No pallor.  Psychiatric: He has a normal mood and affect. His behavior is normal.  Nursing note and vitals reviewed.    ED Treatments / Results  Labs (all labs ordered are listed, but only abnormal results are displayed) Labs Reviewed  CBG MONITORING, ED    EKG None  Radiology No results found.  Procedures Procedures (including critical care time)  Medications Ordered in ED Medications  azithromycin (ZITHROMAX) tablet 1,000 mg (has no administration in time range)     Initial Impression / Assessment and Plan / ED Course  I have reviewed the triage vital signs and the nursing notes.  Pertinent labs & imaging results that were available during my care of the patient were reviewed by me and considered in my medical decision making (see chart for details).     57 yo M with a chief complaint of sore throat.  Patient was seen a few days ago and diagnosed with thrush has been treated for that and feels it is better but is has worsening URI-like symptoms.  The patient is complaining mostly of pain to the neck just under the jaw.  He has a tender and swollen lymph node.  He also has otitis media.  I will treat.  I suspect that his thrush was most likely caused by his poorly controlled diabetes.  I will check a CBG here.  PCP follow-up.  3:59 PM:  I have discussed the diagnosis/risks/treatment options with the patient and  believe the pt to be eligible for discharge home to follow-up with PCP. We also discussed returning to the ED immediately if new or worsening sx occur. We discussed the sx which are most concerning (e.g., sudden worsening pain,  fever, inability to tolerate by mouth) that necessitate immediate return. Medications administered to the patient during their visit and any new prescriptions provided to the patient are listed below.  Medications given during this visit Medications  azithromycin (ZITHROMAX) tablet 1,000 mg (has no administration in time range)    Labs reviewed outside records with A1C elevation.   Old records reviewed recently seen in the emergency department and diagnosed with thrush.  Recently seen by his PCP and told he had poorly controlled diabetes with an A1c of 10.7. CBG 244.    The patient appears reasonably screen and/or stabilized for discharge and I doubt any other medical condition or other [facility] requiring further screening, evaluation, or treatment in the ED at this time prior to discharge.     Final Clinical Impressions(s) / ED Diagnoses   Final diagnoses:  Acute suppurative otitis media of right ear without spontaneous rupture of tympanic membrane, recurrence not specified    ED Discharge Orders        Ordered    azithromycin (ZITHROMAX) 250 MG tablet  Daily     03/31/18 1556       [place], [place] 03/31/18 1602

## 2018-05-16 ENCOUNTER — Other Ambulatory Visit: Payer: Self-pay

## 2018-05-16 ENCOUNTER — Encounter (HOSPITAL_BASED_OUTPATIENT_CLINIC_OR_DEPARTMENT_OTHER): Payer: Self-pay | Admitting: *Deleted

## 2018-05-16 ENCOUNTER — Emergency Department (HOSPITAL_BASED_OUTPATIENT_CLINIC_OR_DEPARTMENT_OTHER): Payer: Medicare Other

## 2018-05-16 ENCOUNTER — Emergency Department (HOSPITAL_BASED_OUTPATIENT_CLINIC_OR_DEPARTMENT_OTHER)
Admission: EM | Admit: 2018-05-16 | Discharge: 2018-05-16 | Disposition: A | Discharge status: Home / Self Care | Payer: Medicare Other | Attending: Emergency Medicine | Admitting: Emergency Medicine

## 2018-05-16 DIAGNOSIS — Z87891 Personal history of nicotine dependence: Secondary | ICD-10-CM | POA: Insufficient documentation

## 2018-05-16 DIAGNOSIS — Z794 Long term (current) use of insulin: Secondary | ICD-10-CM | POA: Insufficient documentation

## 2018-05-16 DIAGNOSIS — I129 Hypertensive chronic kidney disease with stage 1 through stage 4 chronic kidney disease, or unspecified chronic kidney disease: Secondary | ICD-10-CM | POA: Insufficient documentation

## 2018-05-16 DIAGNOSIS — E1122 Type 2 diabetes mellitus with diabetic chronic kidney disease: Secondary | ICD-10-CM | POA: Insufficient documentation

## 2018-05-16 DIAGNOSIS — J45909 Unspecified asthma, uncomplicated: Secondary | ICD-10-CM | POA: Diagnosis not present

## 2018-05-16 DIAGNOSIS — Z79899 Other long term (current) drug therapy: Secondary | ICD-10-CM | POA: Insufficient documentation

## 2018-05-16 DIAGNOSIS — N182 Chronic kidney disease, stage 2 (mild): Secondary | ICD-10-CM | POA: Insufficient documentation

## 2018-05-16 DIAGNOSIS — M545 Low back pain: Secondary | ICD-10-CM | POA: Diagnosis present

## 2018-05-16 DIAGNOSIS — M5442 Lumbago with sciatica, left side: Secondary | ICD-10-CM | POA: Diagnosis not present

## 2018-05-16 DIAGNOSIS — R079 Chest pain, unspecified: Secondary | ICD-10-CM | POA: Diagnosis not present

## 2018-05-16 HISTORY — DX: Other chronic pain: G89.29

## 2018-05-16 LAB — CBC WITH DIFFERENTIAL/PLATELET
Basophils Absolute: 0 10*3/uL (ref 0.0–0.1)
Basophils Relative: 0 %
Eosinophils Absolute: 0.1 10*3/uL (ref 0.0–0.7)
Eosinophils Relative: 1 %
HCT: 39.7 % (ref 39.0–52.0)
Hemoglobin: 13.5 g/dL (ref 13.0–17.0)
Lymphocytes Relative: 37 %
Lymphs Abs: 1.8 10*3/uL (ref 0.7–4.0)
MCH: 28.5 pg (ref 26.0–34.0)
MCHC: 34 g/dL (ref 30.0–36.0)
MCV: 83.9 fL (ref 78.0–100.0)
Monocytes Absolute: 0.5 10*3/uL (ref 0.1–1.0)
Monocytes Relative: 10 %
Neutro Abs: 2.4 10*3/uL (ref 1.7–7.7)
Neutrophils Relative %: 52 %
Platelets: 126 10*3/uL — ABNORMAL LOW (ref 150–400)
RBC: 4.73 MIL/uL (ref 4.22–5.81)
RDW: 14.4 % (ref 11.5–15.5)
WBC: 4.7 10*3/uL (ref 4.0–10.5)

## 2018-05-16 LAB — COMPREHENSIVE METABOLIC PANEL
ALT: 77 U/L — ABNORMAL HIGH (ref 17–63)
AST: 80 U/L — ABNORMAL HIGH (ref 15–41)
Albumin: 4.2 g/dL (ref 3.5–5.0)
Alkaline Phosphatase: 55 U/L (ref 38–126)
Anion gap: 5 (ref 5–15)
BUN: 20 mg/dL (ref 6–20)
CO2: 25 mmol/L (ref 22–32)
Calcium: 9.2 mg/dL (ref 8.9–10.3)
Chloride: 108 mmol/L (ref 101–111)
Creatinine, Ser: 1.18 mg/dL (ref 0.61–1.24)
GFR calc Af Amer: >60 mL/min (ref >60)
GFR calc non Af Amer: >60 mL/min (ref >60)
Glucose, Bld: 148 mg/dL — ABNORMAL HIGH (ref 65–99)
Potassium: 3.5 mmol/L (ref 3.5–5.1)
Sodium: 138 mmol/L (ref 135–145)
Total Bilirubin: 0.6 mg/dL (ref 0.3–1.2)
Total Protein: 7.3 g/dL (ref 6.5–8.1)

## 2018-05-16 LAB — LIPASE, BLOOD: Lipase: 23 U/L (ref 11–51)

## 2018-05-16 LAB — TROPONIN I
Troponin I: <0.03 ng/mL (ref <0.03)
Troponin I: <0.03 ng/mL (ref <0.03)

## 2018-05-16 MED ORDER — MORPHINE SULFATE (PF) 4 MG/ML IV SOLN
4.0000 mg | Freq: Once | INTRAVENOUS | Status: AC
  Administered 2018-05-16: 4 mg via INTRAMUSCULAR
  Filled 2018-05-16: qty 1

## 2018-05-16 MED ORDER — LIDOCAINE 5 % EX PTCH: 1.0000 | MEDICATED_PATCH | CUTANEOUS | 0 refills | Status: DC

## 2018-05-16 MED ORDER — HYDROCODONE-ACETAMINOPHEN 5-325 MG PO TABS
1.0000 | ORAL_TABLET | Freq: Once | ORAL | Status: AC
  Administered 2018-05-16: 1 via ORAL
  Filled 2018-05-16: qty 1

## 2018-05-16 MED ORDER — ONDANSETRON 4 MG PO TBDP
4.0000 mg | ORAL_TABLET | Freq: Once | ORAL | Status: AC
  Administered 2018-05-16: 4 mg via ORAL
  Filled 2018-05-16: qty 1

## 2018-05-16 NOTE — Discharge Instructions (Signed)
You were seen in the ER for lower back pain and chest pain. Your xrays did not show concerning findings. Your ekg and the enzyme we use to check your heart did not show an acute heart attack. Your liver function tests were somewhat elevated and your platelets were somewhat low- these are findings that have been present with previous lab work- please have this rechecked by your primary care provider.   We suspect your discomfort Is related to muscles. Take your hydrocodone as prescirbed, Additionally apply the prescribed lidoderm patches to your left lower back daily- if these are too expensive ask the pharmacist for the over the counter version.   Follow up with your primary care provider in 1-3 days fore re-evaluation. Return to the ER for new or worsening symptoms or any other concerns.

## 2018-05-16 NOTE — ED Notes (Signed)
Attempted to walk pt. Pt sat up with assistance and began clutching his chest complaining of chest pain while grimacing. RN and PA notified.

## 2018-05-16 NOTE — ED Provider Notes (Addendum)
[facility] EMERGENCY DEPARTMENT Provider Note   CSN: [id number] Arrival date & time: 05/16/18  1342     History   Chief Complaint Chief Complaint  Patient presents with  . Chronic back pain    HPI Nicholas [name] is a 57 y.o. male with a hx of former tobacco use, asthma, bipolar 1 disorder, epilepsy, GERD, gastric ulcers, T2DM, and hyperthyroidism who presents to the ED with complaints of back pain that started yesterday. Patient states that he works at [employer], they have an inspection coming up and therefore he has been doing a lot of heavy lifting and twisting/turning motions at work. He states yesterday he picked up a heavier item and felt a pain in his left lower back. States it has been constant since onset. Radiates down the back of the LLE with some paresthesias. Rates pain a 10/10 in severity. Tried flexeril at home without relief. Patient does take hydrocodone for chronic pain- did not have this today or yesterday. States hx of similar with prior back problems. Denies numbness, weakness, saddle anesthesia, incontinence to bowel/bladder, fever, chills, IV drug use, or hx of cancer.   HPI  Past Medical History:  Diagnosis Date  . Asthma   . Bipolar 1 disorder (HCC)   . Borderline glaucoma   . Epididymal pain    LEFT  . Epilepsy, 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23---  LAST SEIZURE 1 WK AGO (APPROX ,  10-31-2013)   NO NEUROLOGIST---  PT SEES PCP  DR Lindajo RoyalAVLOUT  . Feeling of incomplete bladder emptying   . Frequency of urination   . Gastric ulcer   . GERD (gastroesophageal reflux disease)   . Hypertension   . Hyperthyroidism    NO MEDS   . Migraine   . Seizures (HCC)   . Type 2 diabetes mellitus [facility](HCC)     Patient Active Problem List   Diagnosis Date Noted  . Essential hypertension   . Seizure disorder (HCC) 07/02/2017  . Insulin-requiring or dependent type II diabetes mellitus (HCC) 07/02/2017  . CKD (chronic kidney disease), stage II 07/02/2017  . Normocytic  anemia 07/02/2017  . Testicular/scrotal pain 11/09/2013  . Microhematuria 11/09/2013  . Condyloma acuminatum of scrotum 11/09/2013    Past Surgical History:  Procedure Laterality Date  . ANTERIOR CERVICAL DECOMP/DISCECTOMY FUSION  2007   C4  --  C6  . CERVICAL FUSION    . CHOLECYSTECTOMY    . CYSTOSCOPY N/A 11/09/2013   Procedure: CYSTOSCOPY FLEXIBLE;  Surgeon: Bjorn PippinJohn Wrenn, MD;  [facility];  Service: Urology;  Laterality: N/A;  . EPIDIDYMECTOMY Left 11/09/2013   Procedure:  LEFT EPIDIDYMECTOMY;  Surgeon: Bjorn PippinJohn Wrenn, MD;  [facility] [place];  Service: Urology;  Laterality: Left;  POSSIBLE OUTPATIENT WITH OBSERVATION  . EXCISION LIPOMA LEFT SHOULDER  2004 (APPROX)  . MULTIPLE CYST REMOVED FROM CHEST  AGE 70  . OTHER SURGICAL HISTORY     hemorroid surgery   . TESTICLE REMOVAL Left   . TONSILLECTOMY          Home Medications    Prior to Admission medications   Medication Sig Start Date End Date Taking? Authorizing Provider  amitriptyline (ELAVIL) 25 MG tablet Take 25 mg by mouth at bedtime.    [provider]  atorvastatin (LIPITOR) 80 MG tablet Take 80 mg by mouth daily.    [provider]  azithromycin (ZITHROMAX) 250 MG tablet Take 1 tablet (250 mg total) by mouth daily. 03/31/18  Melene Plan, DO  benzonatate (TESSALON) 100 MG capsule Take 1 capsule (100 mg total) by mouth every 8 (eight) hours. 08/06/17   Rolland Porter, MD  carisoprodol (SOMA) 350 MG tablet Take 1 tablet (350 mg total) 3 (three) times daily as needed by mouth for muscle spasms. 10/13/17   Loren Racer, MD  clindamycin (CLEOCIN) 150 MG capsule Take 1 capsule (150 mg total) by mouth every 6 (six) hours. 03/03/18   Gwyneth Sprout, MD  clotrimazole (LOTRIMIN) 1 % cream Apply to affected area 2 times daily until it is resolved. 11/26/17   Alvira Monday, MD  fluticasone-salmeterol (ADVAIR HFA) 130-86 MCG/ACT inhaler Inhale 2 puffs into the lungs daily.     [provider]  gabapentin (NEURONTIN) 300 MG capsule Take 300 mg by mouth 2 (two) times daily.     [provider]  insulin glargine (LANTUS) 100 UNIT/ML injection Inject 45 Units into the skin at bedtime.     [provider]  insulin lispro (HUMALOG) 100 UNIT/ML injection Inject 6 Units into the skin 3 (three) times daily before meals. Sliding scale    [provider]  levETIRAcetam (KEPPRA) 750 MG tablet Take 1 tablet (750 mg total) by mouth 2 (two) times daily. 07/05/17   Lonia Blood, MD  lidocaine (XYLOCAINE) 2 % solution Use as directed 15 mLs in the mouth or throat as needed for mouth pain. 03/26/18   Rancour, Jeannett Senior, MD  losartan (COZAAR) 50 MG tablet Take 50 mg by mouth daily.    [provider]  metFORMIN (GLUCOPHAGE) 500 MG tablet Take 1,000 mg by mouth 2 (two) times daily with a meal.     [provider]  nortriptyline (PAMELOR) 50 MG capsule Take 50 mg at bedtime by mouth.    [provider]  promethazine (PHENERGAN) 25 MG tablet Take 1 tablet (25 mg total) by mouth every 6 (six) hours as needed for nausea or vomiting. 05/04/17   Vanetta Mulders, MD  promethazine-dextromethorphan (PROMETHAZINE-DM) 6.25-15 MG/5ML syrup Take 5 mLs by mouth 4 (four) times daily as needed for cough. 08/06/17   Rolland Porter, MD  rizatriptan (MAXALT) 10 MG tablet Take 10 mg by mouth as needed for migraine. May repeat in 2 hours if needed    [provider]  sucralfate (CARAFATE) 1 GM/10ML suspension Take 10 mLs (1 g total) by mouth 4 (four) times daily -  with meals and at bedtime. 06/09/17   Palumbo, April, MD    Family History History reviewed. No pertinent family history.  Social History Social History   Tobacco Use  . Smoking status: Former Smoker    Packs/day: 0.25    Years: 15.00    Pack years: 3.75    Types: Cigarettes    Last attempt to quit: 11/08/1992    Years since quitting: 25.5  . Smokeless tobacco: Never Used    Substance Use Topics  . Alcohol use: No  . Drug use: No     Allergies   Nsaids; Tramadol; Amoxicillin; Ampicillin; Dilantin [phenytoin]; Penicillins; Risperidone and related; Strawberry extract; Bactrim [sulfamethoxazole-trimethoprim]; Depakote [divalproex sodium]; Flexeril [cyclobenzaprine]; Ultram [tramadol hcl]; and Oatmeal   Review of Systems Review of Systems  Constitutional: Negative for chills, fever and unexpected weight change.  Genitourinary: Negative for dysuria and hematuria.  Musculoskeletal: Positive for back pain.  Neurological: Negative for weakness and numbness.       Negative for incontinence or saddle anesthesia.   All other systems reviewed and are negative.   Physical  Exam Updated Vital Signs BP 119/85 (BP Location: Right Arm)   Pulse 99   Temp 98 F (36.7 C) (Oral)   Resp 16   Ht 5\' 7"  (1.702 m)   Wt 100.7 kg (222 lb)   SpO2 97%   BMI 34.77 kg/m   Physical Exam  Constitutional: He appears well-developed and well-nourished. No distress.  HENT:  Head: Normocephalic and atraumatic.  Eyes: Conjunctivae are normal. Right eye exhibits no discharge. Left eye exhibits no discharge.  Cardiovascular: Normal rate and regular rhythm.  No murmur heard. Pulmonary/Chest: Effort normal and breath sounds normal. No respiratory distress.  Abdominal: Soft. He exhibits no distension. There is no tenderness.  Musculoskeletal:  No obvious deformity, appreciable swelling, erythema, ecchymosis, or overlying warmth.  Back: Patient has diffuse lumbar midline tenderness, nonfocal, no specific vertebra that is more tender than the others.  Tenderness extends more prominently to the left lumbar paraspinal muscles extending into the gluteal region.  Neurological: He is alert.  Clear speech.  Sensation grossly intact bilateral lower extremities.  Patient has 5 out of 5 symmetric strength with plantar dorsiflexion bilaterally.  He is able to lift each of his legs off of the bed.   Patellar DTRs are 2+ and symmetric.  Gait is antalgic, able to walk a few steps then requests to stop.  Patient has some reproducibility of his symptoms with left lower extremity straight leg raise.  Psychiatric: He has a normal mood and affect. His behavior is normal. Thought content normal.  Nursing note and vitals reviewed.  ED Treatments / Results  Labs Results for orders placed or performed during the hospital encounter of 05/16/18  Comprehensive metabolic panel  Result Value Ref Range   Sodium 138 135 - 145 mmol/L   Potassium 3.5 3.5 - 5.1 mmol/L   Chloride 108 101 - 111 mmol/L   CO2 25 22 - 32 mmol/L   Glucose, Bld 148 (H) 65 - 99 mg/dL   BUN 20 6 - 20 mg/dL   Creatinine, Ser 1.61 0.61 - 1.24 mg/dL   Calcium 9.2 8.9 - 09.6 mg/dL   Total Protein 7.3 6.5 - 8.1 g/dL   Albumin 4.2 3.5 - 5.0 g/dL   AST 80 (H) 15 - 41 U/L   ALT 77 (H) 17 - 63 U/L   Alkaline Phosphatase 55 38 - 126 U/L   Total Bilirubin 0.6 0.3 - 1.2 mg/dL   GFR calc non Af Amer >60 >60 mL/min   GFR calc Af Amer >60 >60 mL/min   Anion gap 5 5 - 15  Lipase, blood  Result Value Ref Range   Lipase 23 11 - 51 U/L  CBC with Differential  Result Value Ref Range   WBC 4.7 4.0 - 10.5 K/uL   RBC 4.73 4.22 - 5.81 MIL/uL   Hemoglobin 13.5 13.0 - 17.0 g/dL   HCT 04.5 40.9 - 81.1 %   MCV 83.9 78.0 - 100.0 fL   MCH 28.5 26.0 - 34.0 pg   MCHC 34.0 30.0 - 36.0 g/dL   RDW 91.4 78.2 - 95.6 %   Platelets 126 (L) 150 - 400 K/uL   Neutrophils Relative % 52 %   Neutro Abs 2.4 1.7 - 7.7 K/uL   Lymphocytes Relative 37 %   Lymphs Abs 1.8 0.7 - 4.0 K/uL   Monocytes Relative 10 %   Monocytes Absolute 0.5 0.1 - 1.0 K/uL   Eosinophils Relative 1 %   Eosinophils Absolute 0.1 0.0 - 0.7 K/uL  Basophils Relative 0 %   Basophils Absolute 0.0 0.0 - 0.1 K/uL  Troponin I  Result Value Ref Range   Troponin I <0.03 <0.03 ng/mL   EKG EKG Interpretation  Date/Time:  Monday May 16 2018 16:52:32 EDT Ventricular Rate:  93 PR  Interval:    QRS Duration: 88 QT Interval:  374 QTC Calculation: 466 R Axis:   -32 Text Interpretation:  Sinus rhythm Left axis deviation Borderline T wave abnormalities When compared to prior, no significant changes seen.  No STEMI Confirmed by Theda Belfast ([id number]) on 05/16/2018 6:16:20 PM   Radiology Dg Chest 2 View  Result Date: 05/16/2018 CLINICAL DATA:  Chest pain EXAM: CHEST - 2 VIEW COMPARISON:  02/18/2018 FINDINGS: Cardiac enlargement without heart failure. Bibasilar airspace disease left greater than right has developed since the prior study. Probable atelectasis. Negative for pleural effusion. IMPRESSION: Bibasilar atelectasis left greater than right. Electronically Signed   By: Marlan Palau M.D.   On: 05/16/2018 17:25   Dg Lumbar Spine Complete  Result Date: 05/16/2018 CLINICAL DATA:  Low back pain since this morning. EXAM: LUMBAR SPINE - COMPLETE 4+ VIEW COMPARISON:  None. FINDINGS: There is no evidence of lumbar spine fracture. 3 mm retrolisthesis of L5 on S1. Alignment is otherwise anatomic. Intervertebral disc spaces are maintained. IMPRESSION: No acute osseous injury of the lumbar spine. Electronically Signed   By: Elige Ko   On: 05/16/2018 15:05    Procedures Procedures (including critical care time)  Medications Ordered in ED Medications - No data to display   Initial Impression / Assessment and Plan / ED Course  I have reviewed the triage vital signs and the nursing notes.  Pertinent labs & imaging results that were available during my care of the patient were reviewed by me and considered in my medical decision making (see chart for details).  Patient presents with complaint of back pain.  Patient is nontoxic appearing, initial vitals are WNL. Patient diffusely tender to lumbar region midline to L paraspinal- xray obtained and negative for fractures/dislocations. Patient has normal neurologic exam,  He is hesitantly ambulatory in the ED, will only walk a step  or two on my initial exam which he attributes to be secondary to pain.  No back pain red flags. No urinary sxs. Most likely muscle strain versus spasm, likely component of sciatica. Considered UTI/pyelonephritis, kidney stone, aortic aneurysm/dissection, cauda equina or epidural abscess however these do not fit clinical picture at this time. Patient with hx of gastric ulcer- given this will not give NSAIDs or steroids. Discussed with Dr. Clayborne Dana- will give opioids and re-assess to ensure improved ambulation.   16:55: RE-EVAL: When patient attempted to get out of bed to ambulate with EDT- patient began complaining of L sided chest pain. States it was a thumping/spasm. Does not radiate from the L chest. No alleviating/aggravating factors. No associated dyspnea, emesis, or diaphoresis. On my exam patient has L sided chest wall tenderness to palpation. No overlying edema or crepitus. EDT and RN initiated care with EKG, will add on labs and CXR.   Patient's labs reviewed. No leukocytosis. No anemia. No significant electrolyte abnormalities. His LFTs are elevated and platelets are somewhat low, these findings have been present previously, somewhat worse today,will require PCP follow up. CXR with atelectasis, no pneumonia, effusions, or pneumothorax. EKG without obvious ischemia, no significant change from previous, troponin negative x 2, doubt ACS. Patient low risk wells, given presentation doubt pulmonary embolism. Pain is not tearing or through to  back, no widening of mediastinum, symmetric pulses, doubt dissection. Unclear etiology of patient's chest pain, query musculoskeletal given reproducibility with palpation. He has been ambulatory with me in the ER. Will plan for discharge home with continuation of his hydrocodone which he takes for chronic pain, recommended utilizing this for his back pain, also provided lidoderm patches. I discussed treatment plan, need for PCP follow-up, and return precautions with the  patient. Provided opportunity for questions, patient confirmed understanding and is in agreement with plan.   Findings and plan of care discussed with supervising physician Dr. Rush Landmark at change of shift, in agreement with plan.   Final Clinical Impressions(s) / ED Diagnoses   Final diagnoses:  Acute left-sided low back pain with left-sided sciatica  Chest pain, unspecified type    ED Discharge Orders        Ordered    lidocaine (LIDODERM) 5 %  Every 24 hours     05/16/18 1946       Cherly Anderson, PA-C 05/16/18 2043    Cherly Anderson, PA-C 05/16/18 2045    Tegeler, Canary Brim, MD 05/17/18 [phone]

## 2018-05-16 NOTE — ED Triage Notes (Signed)
Pt c/o lower back pain x 2 days

## 2018-05-16 NOTE — ED Notes (Signed)
Pt returned from xray

## 2018-05-16 NOTE — ED Notes (Signed)
Pt verbalizes understanding of d/c instructions and denies any further needs at this time. 

## 2018-05-16 NOTE — ED Notes (Signed)
Pt began c/o left side chest pain that started as he was trying to stand up to ambulate.  EDP notified

## 2018-05-16 NOTE — ED Notes (Signed)
Pt in xray

## 2018-06-16 ENCOUNTER — Other Ambulatory Visit: Payer: Self-pay

## 2018-06-16 ENCOUNTER — Emergency Department (HOSPITAL_BASED_OUTPATIENT_CLINIC_OR_DEPARTMENT_OTHER): Payer: Medicare Other

## 2018-06-16 ENCOUNTER — Encounter (HOSPITAL_BASED_OUTPATIENT_CLINIC_OR_DEPARTMENT_OTHER): Payer: Self-pay | Admitting: Emergency Medicine

## 2018-06-16 ENCOUNTER — Emergency Department (HOSPITAL_BASED_OUTPATIENT_CLINIC_OR_DEPARTMENT_OTHER)
Admission: EM | Admit: 2018-06-16 | Discharge: 2018-06-16 | Disposition: A | Discharge status: Home / Self Care | Payer: Medicare Other | Attending: Emergency Medicine | Admitting: Emergency Medicine

## 2018-06-16 DIAGNOSIS — E119 Type 2 diabetes mellitus without complications: Secondary | ICD-10-CM | POA: Insufficient documentation

## 2018-06-16 DIAGNOSIS — Y929 Unspecified place or not applicable: Secondary | ICD-10-CM | POA: Diagnosis not present

## 2018-06-16 DIAGNOSIS — Z79899 Other long term (current) drug therapy: Secondary | ICD-10-CM | POA: Insufficient documentation

## 2018-06-16 DIAGNOSIS — Z794 Long term (current) use of insulin: Secondary | ICD-10-CM | POA: Diagnosis not present

## 2018-06-16 DIAGNOSIS — R1032 Left lower quadrant pain: Secondary | ICD-10-CM | POA: Diagnosis present

## 2018-06-16 DIAGNOSIS — N182 Chronic kidney disease, stage 2 (mild): Secondary | ICD-10-CM | POA: Diagnosis not present

## 2018-06-16 DIAGNOSIS — X58XXXA Exposure to other specified factors, initial encounter: Secondary | ICD-10-CM | POA: Diagnosis not present

## 2018-06-16 DIAGNOSIS — Y999 Unspecified external cause status: Secondary | ICD-10-CM | POA: Insufficient documentation

## 2018-06-16 DIAGNOSIS — S29019A Strain of muscle and tendon of unspecified wall of thorax, initial encounter: Secondary | ICD-10-CM | POA: Diagnosis not present

## 2018-06-16 DIAGNOSIS — I129 Hypertensive chronic kidney disease with stage 1 through stage 4 chronic kidney disease, or unspecified chronic kidney disease: Secondary | ICD-10-CM | POA: Insufficient documentation

## 2018-06-16 DIAGNOSIS — Y939 Activity, unspecified: Secondary | ICD-10-CM | POA: Insufficient documentation

## 2018-06-16 LAB — URINALYSIS, ROUTINE W REFLEX MICROSCOPIC
Bilirubin Urine: NEGATIVE
Glucose, UA: >=500 mg/dL — AB
Hgb urine dipstick: NEGATIVE
Ketones, ur: NEGATIVE mg/dL
Leukocytes, UA: NEGATIVE
Nitrite: NEGATIVE
Protein, ur: NEGATIVE mg/dL
Specific Gravity, Urine: 1.01 (ref 1.005–1.030)
pH: 6 (ref 5.0–8.0)

## 2018-06-16 LAB — URINALYSIS, MICROSCOPIC (REFLEX)
Bacteria, UA: NONE SEEN
RBC / HPF: NONE SEEN RBC/hpf (ref 0–5)
WBC, UA: NONE SEEN WBC/hpf (ref 0–5)

## 2018-06-16 MED ORDER — OXYCODONE-ACETAMINOPHEN 5-325 MG PO TABS
1.0000 | ORAL_TABLET | Freq: Once | ORAL | Status: AC
  Administered 2018-06-16: 1 via ORAL
  Filled 2018-06-16: qty 1

## 2018-06-16 MED ORDER — LIDOCAINE 5 % EX PTCH
1.0000 | MEDICATED_PATCH | CUTANEOUS | Status: DC
  Filled 2018-06-16: qty 1

## 2018-06-16 NOTE — Discharge Instructions (Addendum)
You can take tylenol, available over the counter according to label instructions.  You can use lidoderm patch or cream, available over the counter as needed for pain according to label instructions.  Continue your flexeril as prescribed.

## 2018-06-16 NOTE — ED Triage Notes (Addendum)
Per EMS patient woke up at 0200 this morning and stood up and felt a pop in his left flank.  Reports took a muscle relaxer and went back to sleep.  Reports continuing to have pain upon waking up.  Patient seen yesterday for seizures.  Patient states they believe his headaches were causing this.

## 2018-06-16 NOTE — ED Provider Notes (Signed)
[facility] EMERGENCY DEPARTMENT Provider Note   CSN: [id number] Arrival date & time: 06/16/18  4098     History   Chief Complaint Chief Complaint  Patient presents with  . Back Pain    HPI ABDELAZIZ WESTENBERGER is a 57 y.o. male.  The history is provided by the patient and the spouse. No language interpreter was used.  Back Pain     ULRICH SOULES is a 57 y.o. male who presents to the Emergency Department complaining of back pain.  Yesterday he was seen in the ED for 8 seizures.  He was discharged home and was doing well.  At 2am he woke to use the bathroom and when he returned to bed he felt a sudden pop in his left side.  He took a muscle relaxant (flexeril) at the time and went back to bed.  He now notes sharp pain throughout the left side.  Pain is worse with twisting, coughing, bending.  Denies fevers, chest pain, abdominal pain, nausea, vomiting, numbness, weakness.  Sxs are moderate to severe and constant in nature.  He takes no blood thinners.  No hx/o clotting disorder.   He typically takes hydro 10/325 but ran out 3 weeks ago.   Past Medical History:  Diagnosis Date  . Asthma   . Bipolar 1 disorder (HCC)   . Borderline glaucoma   . Chronic pain   . Epididymal pain    LEFT  . 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 Feeling of incomplete bladder emptying   . Frequency of urination   . Gastric ulcer   . GERD (gastroesophageal reflux disease)   . Hypertension   . Hyperthyroidism    NO MEDS   . Migraine   . Seizures (HCC)   . Type 2 diabetes mellitus [facility] [place])     Patient Active Problem List   Diagnosis Date Noted  . Essential hypertension   . Seizure disorder (HCC) 07/02/2017  . Insulin-requiring or dependent type II diabetes mellitus (HCC) 07/02/2017  . CKD (chronic kidney disease), stage II 07/02/2017  . Normocytic anemia 07/02/2017  . Testicular/scrotal pain  11/09/2013  . Microhematuria 11/09/2013  . Condyloma acuminatum of scrotum 11/09/2013    Past Surgical History:  Procedure Laterality Date  . ANTERIOR CERVICAL DECOMP/DISCECTOMY FUSION  2007   C4  --  C6  . CERVICAL FUSION    . CHOLECYSTECTOMY    . CYSTOSCOPY N/A 11/09/2013   Procedure: CYSTOSCOPY FLEXIBLE;  Surgeon: Bjorn Pippin, MD;  Location: [facility];  Service: Urology;  Laterality: N/A;  . EPIDIDYMECTOMY Left 11/09/2013   Procedure:  LEFT EPIDIDYMECTOMY;  Surgeon: Bjorn Pippin, MD;  Location: [facility];  Service: Urology;  Laterality: Left;  POSSIBLE OUTPATIENT WITH OBSERVATION  . EXCISION LIPOMA LEFT SHOULDER  2004 (APPROX)  . MULTIPLE CYST REMOVED FROM CHEST  AGE 47  . OTHER SURGICAL HISTORY     hemorroid surgery   . TESTICLE REMOVAL Left   . TONSILLECTOMY          Home Medications    Prior to Admission medications   Medication Sig Start Date End Date Taking? Authorizing Provider  amitriptyline (ELAVIL) 25 MG tablet Take 25 mg by mouth at bedtime.    [provider]  atorvastatin (LIPITOR) 80 MG tablet Take 80 mg by mouth daily.    [provider]  azithromycin (ZITHROMAX) 250 MG tablet Take 1 tablet (250 mg total) by mouth daily. 03/31/18   Melene Plan, DO  benzonatate (TESSALON) 100 MG capsule Take 1 capsule (100 mg total) by mouth every 8 (eight) hours. 08/06/17   Rolland Porter, MD  carisoprodol (SOMA) 350 MG tablet Take 1 tablet (350 mg total) 3 (three) times daily as needed by mouth for muscle spasms. 10/13/17   Loren Racer, MD  clindamycin (CLEOCIN) 150 MG capsule Take 1 capsule (150 mg total) by mouth every 6 (six) hours. 03/03/18   Gwyneth Sprout, MD  clotrimazole (LOTRIMIN) 1 % cream Apply to affected area 2 times daily until it is resolved. 11/26/17   Alvira Monday, MD  fluticasone-salmeterol (ADVAIR HFA) 161-09 MCG/ACT inhaler Inhale 2 puffs into the lungs daily.    [provider]  gabapentin  (NEURONTIN) 300 MG capsule Take 300 mg by mouth 2 (two) times daily.     [provider]  insulin glargine (LANTUS) 100 UNIT/ML injection Inject 45 Units into the skin at bedtime.     [provider]  insulin lispro (HUMALOG) 100 UNIT/ML injection Inject 6 Units into the skin 3 (three) times daily before meals. Sliding scale    [provider]  levETIRAcetam (KEPPRA) 750 MG tablet Take 1 tablet (750 mg total) by mouth 2 (two) times daily. 07/05/17   Lonia Blood, MD  lidocaine (LIDODERM) 5 % Place 1 patch onto the skin daily. Remove & Discard patch within 12 hours or as directed by MD 05/16/18   Petrucelli, Lelon Mast R, PA-C  lidocaine (XYLOCAINE) 2 % solution Use as directed 15 mLs in the mouth or throat as needed for mouth pain. 03/26/18   Rancour, Jeannett Senior, MD  losartan (COZAAR) 50 MG tablet Take 50 mg by mouth daily.    [provider]  metFORMIN (GLUCOPHAGE) 500 MG tablet Take 1,000 mg by mouth 2 (two) times daily with a meal.     [provider]  nortriptyline (PAMELOR) 50 MG capsule Take 50 mg at bedtime by mouth.    [provider]  promethazine (PHENERGAN) 25 MG tablet Take 1 tablet (25 mg total) by mouth every 6 (six) hours as needed for nausea or vomiting. 05/04/17   Vanetta Mulders, MD  promethazine-dextromethorphan (PROMETHAZINE-DM) 6.25-15 MG/5ML syrup Take 5 mLs by mouth 4 (four) times daily as needed for cough. 08/06/17   Rolland Porter, MD  rizatriptan (MAXALT) 10 MG tablet Take 10 mg by mouth as needed for migraine. May repeat in 2 hours if needed    [provider]  sucralfate (CARAFATE) 1 GM/10ML suspension Take 10 mLs (1 g total) by mouth 4 (four) times daily -  with meals and at bedtime. 06/09/17   Palumbo, April, MD    Family History History reviewed. No pertinent family history.  Social History Social History   Tobacco Use  . Smoking status: Former Smoker    Packs/day: 0.25    Years: 15.00    Pack years: 3.75      Types: Cigarettes    Last attempt to quit: 11/08/1992    Years since quitting: 25.6  . Smokeless tobacco: Never Used  Substance Use Topics  . Alcohol use: No  . Drug use: No     Allergies   Nsaids; Tramadol; Amoxicillin; Ampicillin; Dilantin [phenytoin]; Penicillins; Risperidone and related; Strawberry extract; Bactrim [sulfamethoxazole-trimethoprim]; Depakote [divalproex sodium]; Flexeril [cyclobenzaprine]; Ultram [tramadol hcl]; and Oatmeal   Review of Systems Review of Systems  Musculoskeletal: Positive  for back pain.  All other systems reviewed and are negative.    Physical Exam Updated Vital Signs BP 113/80 (BP Location: Right Arm)   Pulse 73   Temp 98.3 F (36.8 C) (Oral)   Resp 18   Ht 5\' 7"  (1.702 m)   Wt 98.9 kg (218 lb)   SpO2 94%   BMI 34.14 kg/m   Physical Exam  Constitutional: He is oriented to person, place, and time. He appears well-developed and well-nourished.  HENT:  Head: Normocephalic and atraumatic.  Cardiovascular: Normal rate and regular rhythm.  No murmur heard. Pulmonary/Chest: Effort normal and breath sounds normal. No respiratory distress.  Abdominal: Soft. There is no tenderness. There is no rebound and no guarding.  Musculoskeletal: He exhibits no edema or tenderness.  2+ DP pulses bilaterally.  TTP over left posterior chest wall, midline lumbar region.  No CVA tenderness.    Neurological: He is alert and oriented to person, place, and time.  5/5 strength in all four extremities with sensation to light touch intact in all four extremities.    Skin: Skin is warm and dry.  Psychiatric: He has a normal mood and affect. His behavior is normal.  Nursing note and vitals reviewed.    ED Treatments / Results  Labs (all labs ordered are listed, but only abnormal results are displayed) Labs Reviewed  URINALYSIS, ROUTINE W REFLEX MICROSCOPIC - Abnormal; Notable for the following components:      Result Value   Glucose, UA >=500 (*)     All other components within normal limits  URINALYSIS, MICROSCOPIC (REFLEX)    EKG None  Radiology Dg Ribs Unilateral W/chest Left  Result Date: 06/16/2018 CLINICAL DATA:  Nine seizures yesterday, subsequent rib pain and lower back pain. EXAM: LEFT RIBS AND CHEST - 3+ VIEW COMPARISON:  Chest x-rays dated 05/16/2018 and 11/18/2017. FINDINGS: Heart size and mediastinal contours are most likely stable given the slightly oblique patient positioning. Lungs are clear. No pleural effusion or pneumothorax seen. Osseous structures about the chest are unremarkable. LEFT-sided ribs appear normal without fracture or displacement. IMPRESSION: Negative. Electronically Signed   By: Bary Richard M.D.   On: 06/16/2018 09:24   Dg Lumbar Spine Complete  Result Date: 06/16/2018 CLINICAL DATA:  Nine seizures yesterday, subsequent back pain. EXAM: LUMBAR SPINE - COMPLETE 4+ VIEW COMPARISON:  Plain film of the lumbar spine dated 05/16/2018. FINDINGS: Minimal levoscoliosis is likely related to patient positioning. No evidence of acute vertebral body subluxation. No fracture line or displaced fracture fragment seen. No evidence of pars interarticularis defect. Disc spaces appear well maintained in height throughout. No significant spondylitic change. Visualized paravertebral soft tissues are unremarkable. IMPRESSION: Negative. Electronically Signed   By: Bary Richard M.D.   On: 06/16/2018 09:25    Procedures Procedures (including critical care time)  Medications Ordered in ED Medications  oxyCODONE-acetaminophen (PERCOCET/ROXICET) 5-325 MG per tablet 1 tablet (1 tablet Oral Given 06/16/18 0839)     Initial Impression / Assessment and Plan / ED Course  I have reviewed the triage vital signs and the nursing notes.  Pertinent labs & imaging results that were available during my care of the patient were reviewed by me and considered in my medical decision making (see chart for details).     Patient here for  evaluation of thoracic back pain after feeling a pop at 2 AM. He is non-toxic appearing on examination and neurovascular intact. Presentation is not consistent with dissection, PE, cauda equina, acute fracture of  thoracic or lumbar spine, UTI, renal colic. Discussed with patient home care for pain with Tylenol, Lidoderm patches or creams. Patient states that he is allergic to acetaminophen due to gastric ulcer although he can tolerate Norco and Percocet. Discussed with patient that this is not an allergy and he should be able to tolerate acetaminophen without difficulty as this does not irritate the gastric lining. Do not feel narcotics are indicated at this time and are more likely to cause harm then patient benefit. Discussed with patient home care for muscle strain. Discussed outpatient follow-up and return precautions.  Final Clinical Impressions(s) / ED Diagnoses   Final diagnoses:  Thoracic myofascial strain, initial encounter    ED Discharge Orders    None       Tilden Fossaees, Lumen Brinlee, MD 06/16/18 1010

## 2018-07-03 ENCOUNTER — Encounter (HOSPITAL_BASED_OUTPATIENT_CLINIC_OR_DEPARTMENT_OTHER): Payer: Self-pay | Admitting: *Deleted

## 2018-07-03 ENCOUNTER — Emergency Department (HOSPITAL_BASED_OUTPATIENT_CLINIC_OR_DEPARTMENT_OTHER): Payer: Worker's Compensation

## 2018-07-03 ENCOUNTER — Other Ambulatory Visit: Payer: Self-pay

## 2018-07-03 ENCOUNTER — Emergency Department (HOSPITAL_BASED_OUTPATIENT_CLINIC_OR_DEPARTMENT_OTHER)
Admission: EM | Admit: 2018-07-03 | Discharge: 2018-07-04 | Disposition: A | Discharge status: Home / Self Care | Payer: Worker's Compensation | Attending: Emergency Medicine | Admitting: Emergency Medicine

## 2018-07-03 DIAGNOSIS — I129 Hypertensive chronic kidney disease with stage 1 through stage 4 chronic kidney disease, or unspecified chronic kidney disease: Secondary | ICD-10-CM | POA: Insufficient documentation

## 2018-07-03 DIAGNOSIS — Z79899 Other long term (current) drug therapy: Secondary | ICD-10-CM | POA: Diagnosis not present

## 2018-07-03 DIAGNOSIS — Z91018 Allergy to other foods: Secondary | ICD-10-CM | POA: Insufficient documentation

## 2018-07-03 DIAGNOSIS — Z794 Long term (current) use of insulin: Secondary | ICD-10-CM | POA: Diagnosis not present

## 2018-07-03 DIAGNOSIS — R2231 Localized swelling, mass and lump, right upper limb: Secondary | ICD-10-CM | POA: Diagnosis present

## 2018-07-03 DIAGNOSIS — N182 Chronic kidney disease, stage 2 (mild): Secondary | ICD-10-CM | POA: Diagnosis not present

## 2018-07-03 DIAGNOSIS — L03011 Cellulitis of right finger: Secondary | ICD-10-CM

## 2018-07-03 DIAGNOSIS — J45909 Unspecified asthma, uncomplicated: Secondary | ICD-10-CM | POA: Insufficient documentation

## 2018-07-03 DIAGNOSIS — Z87891 Personal history of nicotine dependence: Secondary | ICD-10-CM | POA: Diagnosis not present

## 2018-07-03 MED ORDER — CLINDAMYCIN HCL 150 MG PO CAPS
300.0000 mg | ORAL_CAPSULE | Freq: Once | ORAL | Status: AC
  Administered 2018-07-04: 300 mg via ORAL
  Filled 2018-07-03: qty 2

## 2018-07-03 MED ORDER — LIDOCAINE HCL (PF) 1 % IJ SOLN
5.0000 mL | Freq: Once | INTRAMUSCULAR | Status: AC
  Administered 2018-07-03: 5 mL
  Filled 2018-07-03: qty 5

## 2018-07-03 MED ORDER — CLINDAMYCIN HCL 300 MG PO CAPS: 300.0000 mg | ORAL_CAPSULE | Freq: Three times a day (TID) | ORAL | 0 refills | Status: DC

## 2018-07-03 NOTE — ED Provider Notes (Signed)
[facility] EMERGENCY DEPARTMENT Provider Note   CSN: [id number] Arrival date & time: 07/03/18  2230     History   Chief Complaint Chief Complaint  Patient presents with  . Wound Check    HPI MERRITT Nicholas [name] is a 57 y.o. male.  HPI   57 year old male with pain and swelling in his right index finger.  About 1 week ago he was washing dishes with a Brillo pad.  A sliver of metal went into his finger near the tip.  He removed it.  He thinks he removed in its entirety.  He had some mild soreness initially which has become worse in the past day or 2.  No drainage.  No fevers or chills.  Past Medical History:  Diagnosis Date  . Asthma   . Bipolar 1 disorder (HCC)   . Borderline glaucoma   . Chronic pain   . Epididymal pain    LEFT  . 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 Feeling of incomplete bladder emptying   . Frequency of urination   . Gastric ulcer   . GERD (gastroesophageal reflux disease)   . Hypertension   . Hyperthyroidism    NO MEDS   . Migraine   . Seizures (HCC)   . Type 2 diabetes mellitus [facility])     Patient Active Problem List   Diagnosis Date Noted  . Essential hypertension   . Seizure disorder (HCC) 07/02/2017  . Insulin-requiring or dependent type II diabetes mellitus (HCC) 07/02/2017  . CKD (chronic kidney disease), stage II 07/02/2017  . Normocytic anemia 07/02/2017  . Testicular/scrotal pain 11/09/2013  . Microhematuria 11/09/2013  . Condyloma acuminatum of scrotum 11/09/2013    Past Surgical History:  Procedure Laterality Date  . ANTERIOR CERVICAL DECOMP/DISCECTOMY FUSION  2007   C4  --  C6  . CERVICAL FUSION    . CHOLECYSTECTOMY    . CYSTOSCOPY N/A 11/09/2013   Procedure: CYSTOSCOPY FLEXIBLE;  Surgeon: Bjorn Pippin, MD;  Location: [facility];  Service: Urology;  Laterality: N/A;  . EPIDIDYMECTOMY Left 11/09/2013   Procedure:  LEFT EPIDIDYMECTOMY;  Surgeon: Bjorn Pippin, MD;  Location: [facility];  Service: Urology;  Laterality: Left;  POSSIBLE OUTPATIENT WITH OBSERVATION  . EXCISION LIPOMA LEFT SHOULDER  2004 (APPROX)  . MULTIPLE CYST REMOVED FROM CHEST  AGE 15  . OTHER SURGICAL HISTORY     hemorroid surgery   . TESTICLE REMOVAL Left   . TONSILLECTOMY          Home Medications    Prior to Admission medications   Medication Sig Start Date End Date Taking? Authorizing Provider  amitriptyline (ELAVIL) 25 MG tablet Take 25 mg by mouth at bedtime.   Yes [provider]  atorvastatin (LIPITOR) 80 MG tablet Take 80 mg by mouth daily.   Yes [provider]  carisoprodol (SOMA) 350 MG tablet Take 1 tablet (350 mg total) 3 (three) times daily as needed by mouth for muscle spasms. 10/13/17  Yes Loren Racer, MD  fluticasone-salmeterol (ADVAIR HFA) [phone] MCG/ACT inhaler Inhale 2 puffs into the lungs daily.   Yes [provider]  gabapentin (NEURONTIN) 300 MG capsule Take 300 mg by mouth 2 (two) times daily.    Yes [provider]  insulin glargine (LANTUS) 100 UNIT/ML injection Inject 45 Units into the skin at bedtime.  Yes [provider]  insulin lispro (HUMALOG) 100 UNIT/ML injection Inject 6 Units into the skin 3 (three) times daily before meals. Sliding scale   Yes [provider]  levETIRAcetam (KEPPRA) 750 MG tablet Take 1 tablet (750 mg total) by mouth 2 (two) times daily. 07/05/17  Yes Lonia Blood, MD  lidocaine (LIDODERM) 5 % Place 1 patch onto the skin daily. Remove & Discard patch within 12 hours or as directed by MD 05/16/18  Yes Petrucelli, Samantha R, PA-C  losartan (COZAAR) 50 MG tablet Take 50 mg by mouth daily.   Yes [provider]  metFORMIN (GLUCOPHAGE) 500 MG tablet Take 1,000 mg by mouth 2 (two) times daily with a meal.    Yes [provider]  nortriptyline (PAMELOR) 50 MG capsule Take 50 mg  at bedtime by mouth.   Yes [provider]  rizatriptan (MAXALT) 10 MG tablet Take 10 mg by mouth as needed for migraine. May repeat in 2 hours if needed   Yes [provider]  azithromycin (ZITHROMAX) 250 MG tablet Take 1 tablet (250 mg total) by mouth daily. 03/31/18   Melene Plan, DO  benzonatate (TESSALON) 100 MG capsule Take 1 capsule (100 mg total) by mouth every 8 (eight) hours. 08/06/17   Rolland Porter, MD  clindamycin (CLEOCIN) 150 MG capsule Take 1 capsule (150 mg total) by mouth every 6 (six) hours. 03/03/18   Gwyneth Sprout, MD  clotrimazole (LOTRIMIN) 1 % cream Apply to affected area 2 times daily until it is resolved. 11/26/17   Alvira Monday, MD  lidocaine (XYLOCAINE) 2 % solution Use as directed 15 mLs in the mouth or throat as needed for mouth pain. 03/26/18   Rancour, Jeannett Senior, MD  promethazine (PHENERGAN) 25 MG tablet Take 1 tablet (25 mg total) by mouth every 6 (six) hours as needed for nausea or vomiting. 05/04/17   Vanetta Mulders, MD  promethazine-dextromethorphan (PROMETHAZINE-DM) 6.25-15 MG/5ML syrup Take 5 mLs by mouth 4 (four) times daily as needed for cough. 08/06/17   Rolland Porter, MD  sucralfate (CARAFATE) 1 GM/10ML suspension Take 10 mLs (1 g total) by mouth 4 (four) times daily -  with meals and at bedtime. 06/09/17   Palumbo, April, MD    Family History No family history on file.  Social History Social History   Tobacco Use  . Smoking status: Former Smoker    Packs/day: 0.25    Years: 15.00    Pack years: 3.75    Types: Cigarettes    Last attempt to quit: 11/08/1992    Years since quitting: 25.6  . Smokeless tobacco: Never Used  Substance Use Topics  . Alcohol use: No  . Drug use: No     Allergies   Nsaids; Tramadol; Amoxicillin; Ampicillin; Dilantin [phenytoin]; Penicillins; Risperidone and related; Strawberry extract; Bactrim [sulfamethoxazole-trimethoprim]; Depakote [divalproex sodium]; Flexeril [cyclobenzaprine]; Ultram [tramadol hcl];  and Oatmeal   Review of Systems Review of Systems  All systems reviewed and negative, other than as noted in HPI.  Physical Exam Updated Vital Signs BP (!) 143/78 (BP Location: Right Arm)   Pulse 95   Temp 98 F (36.7 C) (Oral)   Ht 5\' 7"  (1.702 m)   Wt 98.9 kg (218 lb)   SpO2 95%   BMI 34.14 kg/m   Physical Exam  Constitutional: He appears well-developed and well-nourished. No distress.  HENT:  Head: Normocephalic and atraumatic.  Eyes: Conjunctivae are normal. Right eye exhibits no discharge. Left eye exhibits no discharge.  Neck: Neck supple.  Cardiovascular: Normal rate, regular rhythm and normal heart sounds. Exam reveals no gallop and no friction rub.  No murmur heard. Pulmonary/Chest: Effort normal and breath sounds normal. No respiratory distress.  Abdominal: Soft. He exhibits no distension. There is no tenderness.  Musculoskeletal: He exhibits no edema or tenderness.  Tenderness and mild swelling of the distal right finger along the eponychial fold.  There may perhaps be a small paronychia.  Finger pad is soft and nontender.  Neurological: He is alert.  Skin: Skin is warm and dry.  Psychiatric: He has a normal mood and affect. His behavior is normal. Thought content normal.  Nursing note and vitals reviewed.    ED Treatments / Results  Labs (all labs ordered are listed, but only abnormal results are displayed) Labs Reviewed - No data to display  EKG None  Radiology No results found.  Procedures Procedures (including critical care time)  INCISION AND DRAINAGE Performed by: Raeford RazorStephen Rodrigo Mcgranahan Consent: Verbal consent obtained. Risks and benefits: risks, benefits and alternatives were discussed Type: abscess  Body area: finger  Anesthesia: digital block  Incision was made with a scalpel.  Local anesthetic: lidocaine 1% Anesthetic total: 3 ml  Complexity: complex Blunt dissection to break up loculations  Drainage: purulent  Drainage amount:  small Packing material:   Patient tolerance: Patient tolerated the procedure well with no immediate complications.  NERVE BLOCK Performed by: Raeford RazorStephen Elise Knobloch Consent: Verbal consent obtained. Required items: required blood products, implants, devices, and special equipment available Time out: Immediately prior to procedure a "time out" was called to verify the correct patient, procedure, equipment, support staff and site/side marked as required.  Indication: pain relief. I&D.  Nerve block body site: R index finger  Preparation: Patient was prepped and draped in the usual sterile fashion. Needle gauge: 27 G Location technique: anatomical landmarks  Local anesthetic: 1% lidocaine Anesthetic total: 3 ml  Outcome: pain improved Patient tolerance: Patient tolerated the procedure well with no immediate complications.      Medications Ordered in ED Medications  lidocaine (PF) (XYLOCAINE) 1 % injection 5 mL (has no administration in time range)     Initial Impression / Assessment and Plan / ED Course  I have reviewed the triage vital signs and the nursing notes.  Pertinent labs & imaging results that were available during my care of the patient were reviewed by me and considered in my medical decision making (see chart for details).     57 year old male with pain in his right index finger.  He may be developing a paronychia that there is no impressive collection at this point.  Will x-ray to eval for possible retained foreign body.  I&D of paronychia.   Small amount of pus drained. Pt required digital block because of degree of pain he was having. Continued wound care and return precautions discussed.   Final Clinical Impressions(s) / ED Diagnoses   Final diagnoses:  Paronychia of finger, right    ED Discharge Orders    None       Raeford RazorKohut, Sherl Yzaguirre, MD 07/08/18 [phone]

## 2018-07-03 NOTE — ED Notes (Signed)
ED Provider at bedside. 

## 2018-07-03 NOTE — ED Triage Notes (Signed)
C/o pain in right index finger. States pulled a "piece of steel" out of the finger 1 week ago

## 2018-07-10 ENCOUNTER — Encounter (HOSPITAL_BASED_OUTPATIENT_CLINIC_OR_DEPARTMENT_OTHER): Payer: Self-pay | Admitting: Emergency Medicine

## 2018-07-10 ENCOUNTER — Emergency Department (HOSPITAL_BASED_OUTPATIENT_CLINIC_OR_DEPARTMENT_OTHER)
Admission: EM | Admit: 2018-07-10 | Discharge: 2018-07-10 | Disposition: A | Discharge status: Home / Self Care | Payer: Medicare Other | Attending: Emergency Medicine | Admitting: Emergency Medicine

## 2018-07-10 ENCOUNTER — Other Ambulatory Visit: Payer: Self-pay

## 2018-07-10 DIAGNOSIS — N182 Chronic kidney disease, stage 2 (mild): Secondary | ICD-10-CM | POA: Diagnosis not present

## 2018-07-10 DIAGNOSIS — J45909 Unspecified asthma, uncomplicated: Secondary | ICD-10-CM | POA: Insufficient documentation

## 2018-07-10 DIAGNOSIS — E1122 Type 2 diabetes mellitus with diabetic chronic kidney disease: Secondary | ICD-10-CM | POA: Insufficient documentation

## 2018-07-10 DIAGNOSIS — I129 Hypertensive chronic kidney disease with stage 1 through stage 4 chronic kidney disease, or unspecified chronic kidney disease: Secondary | ICD-10-CM | POA: Diagnosis not present

## 2018-07-10 DIAGNOSIS — Z794 Long term (current) use of insulin: Secondary | ICD-10-CM | POA: Diagnosis not present

## 2018-07-10 DIAGNOSIS — R51 Headache: Secondary | ICD-10-CM | POA: Diagnosis present

## 2018-07-10 DIAGNOSIS — Z87891 Personal history of nicotine dependence: Secondary | ICD-10-CM | POA: Insufficient documentation

## 2018-07-10 DIAGNOSIS — G43009 Migraine without aura, not intractable, without status migrainosus: Secondary | ICD-10-CM | POA: Insufficient documentation

## 2018-07-10 LAB — COMPREHENSIVE METABOLIC PANEL
ALT: 56 U/L — ABNORMAL HIGH (ref 0–44)
AST: 36 U/L (ref 15–41)
Albumin: 4.3 g/dL (ref 3.5–5.0)
Alkaline Phosphatase: 55 U/L (ref 38–126)
Anion gap: 7 (ref 5–15)
BUN: 19 mg/dL (ref 6–20)
CO2: 25 mmol/L (ref 22–32)
Calcium: 9.2 mg/dL (ref 8.9–10.3)
Chloride: 109 mmol/L (ref 98–111)
Creatinine, Ser: 1.18 mg/dL (ref 0.61–1.24)
GFR calc Af Amer: >60 mL/min (ref >60)
GFR calc non Af Amer: >60 mL/min (ref >60)
Glucose, Bld: 270 mg/dL — ABNORMAL HIGH (ref 70–99)
Potassium: 3.9 mmol/L (ref 3.5–5.1)
Sodium: 141 mmol/L (ref 135–145)
Total Bilirubin: 0.5 mg/dL (ref 0.3–1.2)
Total Protein: 7.1 g/dL (ref 6.5–8.1)

## 2018-07-10 LAB — CBC WITH DIFFERENTIAL/PLATELET
Basophils Absolute: 0 10*3/uL (ref 0.0–0.1)
Basophils Relative: 0 %
Eosinophils Absolute: 0.1 10*3/uL (ref 0.0–0.7)
Eosinophils Relative: 2 %
HCT: 40.4 % (ref 39.0–52.0)
Hemoglobin: 13.3 g/dL (ref 13.0–17.0)
Lymphocytes Relative: 32 %
Lymphs Abs: 1.7 10*3/uL (ref 0.7–4.0)
MCH: 27.9 pg (ref 26.0–34.0)
MCHC: 32.9 g/dL (ref 30.0–36.0)
MCV: 84.9 fL (ref 78.0–100.0)
Monocytes Absolute: 0.5 10*3/uL (ref 0.1–1.0)
Monocytes Relative: 9 %
Neutro Abs: 3 10*3/uL (ref 1.7–7.7)
Neutrophils Relative %: 57 %
Platelets: 138 10*3/uL — ABNORMAL LOW (ref 150–400)
RBC: 4.76 MIL/uL (ref 4.22–5.81)
RDW: 14.8 % (ref 11.5–15.5)
WBC: 5.3 10*3/uL (ref 4.0–10.5)

## 2018-07-10 MED ORDER — DIPHENHYDRAMINE HCL 50 MG/ML IJ SOLN
12.5000 mg | Freq: Once | INTRAMUSCULAR | Status: AC
  Administered 2018-07-10: 12.5 mg via INTRAVENOUS
  Filled 2018-07-10: qty 1

## 2018-07-10 MED ORDER — SODIUM CHLORIDE 0.9 % IV BOLUS
1000.0000 mL | Freq: Once | INTRAVENOUS | Status: AC
  Administered 2018-07-10: 1000 mL via INTRAVENOUS

## 2018-07-10 MED ORDER — DEXAMETHASONE SODIUM PHOSPHATE 10 MG/ML IJ SOLN
10.0000 mg | Freq: Once | INTRAMUSCULAR | Status: AC
  Administered 2018-07-10: 10 mg via INTRAVENOUS
  Filled 2018-07-10: qty 1

## 2018-07-10 MED ORDER — PROMETHAZINE HCL 25 MG/ML IJ SOLN
25.0000 mg | Freq: Once | INTRAMUSCULAR | Status: AC
  Administered 2018-07-10: 25 mg via INTRAVENOUS
  Filled 2018-07-10: qty 1

## 2018-07-10 NOTE — ED Triage Notes (Signed)
Pt reports headache x 3 weeks. Hx of migraines, has tried normal medications without relief.

## 2018-07-10 NOTE — Discharge Instructions (Signed)
Continue your current meds for migraines.   See your neurologist for follow up   Return to ER if you have worse headaches, vomiting, worse blurry vision, trouble speaking, weakness

## 2018-07-10 NOTE — ED Provider Notes (Signed)
[facility] EMERGENCY DEPARTMENT Provider Note   CSN: [id number] Arrival date & time: 07/10/18  1457     History   Chief Complaint Chief Complaint  Patient presents with  . Headache    HPI Nicholas [name] is a 57 y.o. male hx of bipolar, migraines, seizures on keppra, here with headaches, migraines. Patient states he has chronic migraines. He states that he follows up with neurology at [facility] and is on nortriptyline for migraines. For the last 3 weeks, he has progressively worsening headaches that is throbbing and on the right side. Associated with blurry vision. Denies trouble speaking or fever or vomiting or weakness or numbness. He was seen in the ED several weeks ago for seizures and had CT head that was unremarkable. Patient states that he is compliant with his keppra.    The history is provided by the patient.    Past Medical History:  Diagnosis Date  . Asthma   . Bipolar 1 disorder (HCC)   . Borderline glaucoma   . Chronic pain   . Epididymal pain    LEFT  . 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 Feeling of incomplete bladder emptying   . Frequency of urination   . Gastric ulcer   . GERD (gastroesophageal reflux disease)   . Hypertension   . Hyperthyroidism    NO MEDS   . Migraine   . Seizures (HCC)   . Type 2 diabetes mellitus [facility])     Patient Active Problem List   Diagnosis Date Noted  . Essential hypertension   . Seizure disorder (HCC) 07/02/2017  . Insulin-requiring or dependent type II diabetes mellitus (HCC) 07/02/2017  . CKD (chronic kidney disease), stage II 07/02/2017  . Normocytic anemia 07/02/2017  . Testicular/scrotal pain 11/09/2013  . Microhematuria 11/09/2013  . Condyloma acuminatum of scrotum 11/09/2013    Past Surgical History:  Procedure Laterality Date  . ANTERIOR CERVICAL DECOMP/DISCECTOMY FUSION  2007   C4  --  C6  .  CERVICAL FUSION    . CHOLECYSTECTOMY    . CYSTOSCOPY N/A 11/09/2013   Procedure: CYSTOSCOPY FLEXIBLE;  Surgeon: Bjorn Pippin, MD;  Location: [facility];  Service: Urology;  Laterality: N/A;  . EPIDIDYMECTOMY Left 11/09/2013   Procedure:  LEFT EPIDIDYMECTOMY;  Surgeon: Bjorn Pippin, MD;  Location: [facility];  Service: Urology;  Laterality: Left;  POSSIBLE OUTPATIENT WITH OBSERVATION  . EXCISION LIPOMA LEFT SHOULDER  2004 (APPROX)  . MULTIPLE CYST REMOVED FROM CHEST  AGE 71  . OTHER SURGICAL HISTORY     hemorroid surgery   . TESTICLE REMOVAL Left   . TONSILLECTOMY          Home Medications    Prior to Admission medications   Medication Sig Start Date End Date Taking? Authorizing Provider  amitriptyline (ELAVIL) 25 MG tablet Take 25 mg by mouth at bedtime.    [provider]  atorvastatin (LIPITOR) 80 MG tablet Take 80 mg by mouth daily.    [provider]  azithromycin (ZITHROMAX) 250 MG tablet Take 1 tablet (250 mg total) by mouth daily. 03/31/18   Melene Plan, DO  benzonatate (TESSALON) 100 MG capsule Take 1 capsule (100 mg total) by mouth every 8 (eight) hours. 08/06/17   Rolland Porter, MD  carisoprodol (SOMA) 350 MG tablet Take 1 tablet (350 mg total)  3 (three) times daily as needed by mouth for muscle spasms. 10/13/17   Loren RacerYelverton, David, MD  clindamycin (CLEOCIN) 300 MG capsule Take 1 capsule (300 mg total) by mouth 3 (three) times daily. 07/03/18   Raeford RazorKohut, Stephen, MD  clotrimazole (LOTRIMIN) 1 % cream Apply to affected area 2 times daily until it is resolved. 11/26/17   Alvira MondaySchlossman, Erin, MD  fluticasone-salmeterol (ADVAIR HFA) 161-09115-21 MCG/ACT inhaler Inhale 2 puffs into the lungs daily.    [provider]  gabapentin (NEURONTIN) 300 MG capsule Take 300 mg by mouth 2 (two) times daily.     [provider]  insulin glargine (LANTUS) 100 UNIT/ML injection Inject 45 Units into the skin at bedtime.     [provider]    insulin lispro (HUMALOG) 100 UNIT/ML injection Inject 6 Units into the skin 3 (three) times daily before meals. Sliding scale    [provider]  levETIRAcetam (KEPPRA) 750 MG tablet Take 1 tablet (750 mg total) by mouth 2 (two) times daily. 07/05/17   Lonia BloodMcClung, Jeffrey T, MD  lidocaine (LIDODERM) 5 % Place 1 patch onto the skin daily. Remove & Discard patch within 12 hours or as directed by MD 05/16/18   Petrucelli, Lelon MastSamantha R, PA-C  lidocaine (XYLOCAINE) 2 % solution Use as directed 15 mLs in the mouth or throat as needed for mouth pain. 03/26/18   Rancour, Jeannett SeniorStephen, MD  losartan (COZAAR) 50 MG tablet Take 50 mg by mouth daily.    [provider]  metFORMIN (GLUCOPHAGE) 500 MG tablet Take 1,000 mg by mouth 2 (two) times daily with a meal.     [provider]  nortriptyline (PAMELOR) 50 MG capsule Take 50 mg at bedtime by mouth.    [provider]  promethazine (PHENERGAN) 25 MG tablet Take 1 tablet (25 mg total) by mouth every 6 (six) hours as needed for nausea or vomiting. 05/04/17   Vanetta MuldersZackowski, Scott, MD  promethazine-dextromethorphan (PROMETHAZINE-DM) 6.25-15 MG/5ML syrup Take 5 mLs by mouth 4 (four) times daily as needed for cough. 08/06/17   Rolland PorterJames, Mark, MD  rizatriptan (MAXALT) 10 MG tablet Take 10 mg by mouth as needed for migraine. May repeat in 2 hours if needed    [provider]  sucralfate (CARAFATE) 1 GM/10ML suspension Take 10 mLs (1 g total) by mouth 4 (four) times daily -  with meals and at bedtime. 06/09/17   Palumbo, April, MD    Family History No family history on file.  Social History Social History   Tobacco Use  . Smoking status: Former Smoker    Packs/day: 0.25    Years: 15.00    Pack years: 3.75    Types: Cigarettes    Last attempt to quit: 11/08/1992    Years since quitting: 25.6  . Smokeless tobacco: Never Used  Substance Use Topics  . Alcohol use: No  . Drug use: No     Allergies   Nsaids; Tramadol; Amoxicillin;  Ampicillin; Dilantin [phenytoin]; Penicillins; Risperidone and related; Strawberry extract; Bactrim [sulfamethoxazole-trimethoprim]; Depakote [divalproex sodium]; Flexeril [cyclobenzaprine]; Ultram [tramadol hcl]; and Oatmeal   Review of Systems Review of Systems  Neurological: Positive for headaches.  All other systems reviewed and are negative.    Physical Exam Updated Vital Signs BP (!) 153/74 (BP Location: Left Arm)   Pulse (!) 102   Temp 98.3 F (36.8 C) (Oral)   Resp 20   Ht 5\' 7"  (1.702 m)   Wt 102.5 kg (226 lb)   SpO2 95%  BMI 35.40 kg/m   Physical Exam  Constitutional: He is oriented to person, place, and time.  Slightly uncomfortable, photophobic   HENT:  Head: Normocephalic.  Eyes: EOM are normal.  Neck: Normal range of motion. Neck supple.  Cardiovascular: Normal rate, regular rhythm and normal heart sounds.  Pulmonary/Chest: Effort normal and breath sounds normal.  Abdominal: Soft. Bowel sounds are normal.  Musculoskeletal: Normal range of motion.  Neurological: He is alert and oriented to person, place, and time. He has normal strength. Coordination and gait normal.  CN 2- 12 intact, nl strength throughout   Skin: Skin is warm. Capillary refill takes less than 2 seconds.  Psychiatric: He has a normal mood and affect. His behavior is normal.  Nursing note and vitals reviewed.    ED Treatments / Results  Labs (all labs ordered are listed, but only abnormal results are displayed) Labs Reviewed  CBC WITH DIFFERENTIAL/PLATELET - Abnormal; Notable for the following components:      Result Value   Platelets 138 (*)    All other components within normal limits  COMPREHENSIVE METABOLIC PANEL - Abnormal; Notable for the following components:   Glucose, Bld 270 (*)    ALT 56 (*)    All other components within normal limits    EKG None  Radiology No results found.  Procedures Procedures (including critical care time)  Medications Ordered in  ED Medications  sodium chloride 0.9 % bolus 1,000 mL (1,000 mLs Intravenous New Bag/Given 07/10/18 1632)  promethazine (PHENERGAN) injection 25 mg (25 mg Intravenous Given 07/10/18 1634)  dexamethasone (DECADRON) injection 10 mg (10 mg Intravenous Given 07/10/18 1634)  diphenhydrAMINE (BENADRYL) injection 12.5 mg (12.5 mg Intravenous Given 07/10/18 1633)     Initial Impression / Assessment and Plan / ED Course  I have reviewed the triage vital signs and the nursing notes.  Pertinent labs & imaging results that were available during my care of the patient were reviewed by me and considered in my medical decision making (see chart for details).     OZIE LUPE is a 57 y.o. male here with headache, blurry vision. Hx of migraines and states that this is typical of his migraines. Had CT head recently that was unremarkable. Will get labs, give migraine cocktail and reassess.   5:08 PM Labs unremarkable. Felt better after migraine cocktail. Stable for discharge     Final Clinical Impressions(s) / ED Diagnoses   Final diagnoses:  None    ED Discharge Orders    None       Charlynne Pander, MD 07/10/18 [id number]

## 2018-07-29 ENCOUNTER — Emergency Department (HOSPITAL_COMMUNITY): Payer: Medicare Other

## 2018-07-29 ENCOUNTER — Emergency Department (HOSPITAL_COMMUNITY)
Admission: EM | Admit: 2018-07-29 | Discharge: 2018-07-29 | Disposition: A | Discharge status: Home / Self Care | Payer: Medicare Other | Attending: Emergency Medicine | Admitting: Emergency Medicine

## 2018-07-29 ENCOUNTER — Encounter (HOSPITAL_COMMUNITY): Payer: Self-pay

## 2018-07-29 DIAGNOSIS — R569 Unspecified convulsions: Secondary | ICD-10-CM | POA: Diagnosis not present

## 2018-07-29 DIAGNOSIS — N182 Chronic kidney disease, stage 2 (mild): Secondary | ICD-10-CM | POA: Insufficient documentation

## 2018-07-29 DIAGNOSIS — Z794 Long term (current) use of insulin: Secondary | ICD-10-CM | POA: Insufficient documentation

## 2018-07-29 DIAGNOSIS — J45909 Unspecified asthma, uncomplicated: Secondary | ICD-10-CM | POA: Insufficient documentation

## 2018-07-29 DIAGNOSIS — E119 Type 2 diabetes mellitus without complications: Secondary | ICD-10-CM | POA: Insufficient documentation

## 2018-07-29 DIAGNOSIS — Z87891 Personal history of nicotine dependence: Secondary | ICD-10-CM | POA: Diagnosis not present

## 2018-07-29 DIAGNOSIS — I129 Hypertensive chronic kidney disease with stage 1 through stage 4 chronic kidney disease, or unspecified chronic kidney disease: Secondary | ICD-10-CM | POA: Diagnosis not present

## 2018-07-29 DIAGNOSIS — Z79899 Other long term (current) drug therapy: Secondary | ICD-10-CM | POA: Insufficient documentation

## 2018-07-29 LAB — CBC WITH DIFFERENTIAL/PLATELET
Abs Immature Granulocytes: 0 10*3/uL (ref 0.0–0.1)
Basophils Absolute: 0 10*3/uL (ref 0.0–0.1)
Basophils Relative: 1 %
Eosinophils Absolute: 0.1 10*3/uL (ref 0.0–0.7)
Eosinophils Relative: 2 %
HCT: 45.1 % (ref 39.0–52.0)
Hemoglobin: 14.5 g/dL (ref 13.0–17.0)
Immature Granulocytes: 0 %
Lymphocytes Relative: 34 %
Lymphs Abs: 1.7 10*3/uL (ref 0.7–4.0)
MCH: 28 pg (ref 26.0–34.0)
MCHC: 32.2 g/dL (ref 30.0–36.0)
MCV: 87.2 fL (ref 78.0–100.0)
Monocytes Absolute: 0.5 10*3/uL (ref 0.1–1.0)
Monocytes Relative: 9 %
Neutro Abs: 2.6 10*3/uL (ref 1.7–7.7)
Neutrophils Relative %: 54 %
Platelets: 147 10*3/uL — ABNORMAL LOW (ref 150–400)
RBC: 5.17 MIL/uL (ref 4.22–5.81)
RDW: 14.5 % (ref 11.5–15.5)
WBC: 4.9 10*3/uL (ref 4.0–10.5)

## 2018-07-29 LAB — BASIC METABOLIC PANEL
Anion gap: 9 (ref 5–15)
BUN: 18 mg/dL (ref 6–20)
CO2: 25 mmol/L (ref 22–32)
Calcium: 9.9 mg/dL (ref 8.9–10.3)
Chloride: 104 mmol/L (ref 98–111)
Creatinine, Ser: 1.28 mg/dL — ABNORMAL HIGH (ref 0.61–1.24)
GFR calc Af Amer: >60 mL/min (ref >60)
GFR calc non Af Amer: >60 mL/min (ref >60)
Glucose, Bld: 331 mg/dL — ABNORMAL HIGH (ref 70–99)
Potassium: 4.2 mmol/L (ref 3.5–5.1)
Sodium: 138 mmol/L (ref 135–145)

## 2018-07-29 LAB — CBG MONITORING, ED: Glucose-Capillary: 303 mg/dL — ABNORMAL HIGH (ref 70–99)

## 2018-07-29 LAB — MAGNESIUM: Magnesium: 1.8 mg/dL (ref 1.7–2.4)

## 2018-07-29 MED ORDER — LEVETIRACETAM 250 MG PO TABS: 250.0000 mg | ORAL_TABLET | Freq: Two times a day (BID) | ORAL | 0 refills | Status: DC

## 2018-07-29 MED ORDER — LEVETIRACETAM IN NACL 1000 MG/100ML IV SOLN
1000.0000 mg | Freq: Once | INTRAVENOUS | Status: AC
  Administered 2018-07-29: 1000 mg via INTRAVENOUS
  Filled 2018-07-29: qty 100

## 2018-07-29 NOTE — ED Triage Notes (Signed)
Pt. Arrived by EMS from home after unwitnessed fall and was found by wife. Pt. Was responsive to painful stimuli at scene. Pt. Reported not having feeling to  Left leg. Pt. ab Slurred speech at baseline. Pt. Started an insulin pump 2 days ago.  CBG 495

## 2018-07-29 NOTE — ED Notes (Signed)
Patients wife Massie BougieBelinda contacted and reports will be coming to pick patient up.

## 2018-07-29 NOTE — Progress Notes (Signed)
Pt came to have MRI, noted that pt has a V-go insulin device which is MRI NON compatible.  Called Katie in the ER and was instructed to remove it and they would make arrangements to monitor his glucose.  Device was removed by the patient before starting the procedure.

## 2018-07-29 NOTE — Discharge Instructions (Signed)
You need to increase keppra to 1250 mg Twice daily (continue your 1000 mg tablets, just add 250 mg tablets when you take them).   See your neurologist   Return to ER if you have another seizure, worse trouble speaking, weakness or numbness

## 2018-07-29 NOTE — ED Notes (Signed)
Massie BougieBelinda (Wife)- coming this morning [phone]

## 2018-07-29 NOTE — ED Provider Notes (Signed)
  Physical Exam  BP (!) 126/96   Pulse (!) 101   Temp 98.7 F (37.1 C) (Oral)   Resp 20   SpO2 95%   Physical Exam  ED Course/Procedures   Clinical Course as of Jul 29 1037  Fri Jul 29, 2018  0722 Patient C-spine has been cleared post negative CT scan. On repeat exam he continues to have left lower extremity numbness.  MRI has been ordered.  Dr. Silverio LayYao to follow-up.   [AN]    Clinical Course User Index [AN] Derwood KaplanNanavati, Ankit, MD    Procedures  MDM  Patient is here with seizure like activity. Has L sided weakness on arrival that is persistent. Not TPA candidate due to seizure. 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 Sign out pending MRI brain and reassessment. Loaded with keppra 1000 mg IV already.   10:39 AM MRI brain showed no stroke. Complains of double vision and persistent L arm numbness. I talked to Dr. Otelia LimesLindzen from neurology, who reviewed his scans. He felt that patient can be discharged and increase keppra by 20%. He is on 1000 mg BID so will increase to 1250 mg BID. He has neurologist at [facility]. Of note, he was seen at [facility] for seizure like activity about a month ago and was found to have some aphasia and weakness as well.       Nicholas [name], Nicholas Casados Hsienta, MD 07/29/18 1041

## 2018-07-29 NOTE — ED Provider Notes (Addendum)
[name] EMERGENCY DEPARTMENT Provider Note   CSN: [id number] Arrival date & time: 07/29/18  4696     History   Chief Complaint No chief complaint on file.   HPI Nicholas [name] is a 57 y.o. male.  HPI  57 year old male comes in with chief complaint of fall. Patient has history of seizure disorder, bipolar disorder, asthma and it appears that he has chronically slurred speech.  According to EMS report, patient had an unwitnessed fall at his home.  His wife called EMS, and EMS noted that patient was responding to painful stimuli only.  Patient now more alert.  He is unsure what happened, and reports that his last seizure episode was in April.  Patient is complaining of left lower extremity numbness.  He has no history of strokes.   Past Medical History:  Diagnosis Date  . Asthma   . Bipolar 1 disorder (HCC)   . Borderline glaucoma   . Chronic pain   . Epididymal pain    LEFT  . 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 Feeling of incomplete bladder emptying   . Frequency of urination   . Gastric ulcer   . GERD (gastroesophageal reflux disease)   . Hypertension   . Hyperthyroidism    NO MEDS   . Migraine   . Seizures (HCC)   . Type 2 diabetes mellitus [facility])     Patient Active Problem List   Diagnosis Date Noted  . Essential hypertension   . Seizure disorder (HCC) 07/02/2017  . Insulin-requiring or dependent type II diabetes mellitus (HCC) 07/02/2017  . CKD (chronic kidney disease), stage II 07/02/2017  . Normocytic anemia 07/02/2017  . Testicular/scrotal pain 11/09/2013  . Microhematuria 11/09/2013  . Condyloma acuminatum of scrotum 11/09/2013    Past Surgical History:  Procedure Laterality Date  . ANTERIOR CERVICAL DECOMP/DISCECTOMY FUSION  2007   C4  --  C6  . CERVICAL FUSION    . CHOLECYSTECTOMY    . CYSTOSCOPY N/A 11/09/2013   Procedure:  CYSTOSCOPY FLEXIBLE;  Surgeon: Bjorn Pippin, MD;  Location: [facility];  Service: Urology;  Laterality: N/A;  . EPIDIDYMECTOMY Left 11/09/2013   Procedure:  LEFT EPIDIDYMECTOMY;  Surgeon: Bjorn Pippin, MD;  Location: [facility];  Service: Urology;  Laterality: Left;  POSSIBLE OUTPATIENT WITH OBSERVATION  . EXCISION LIPOMA LEFT SHOULDER  2004 (APPROX)  . MULTIPLE CYST REMOVED FROM CHEST  AGE 69  . OTHER SURGICAL HISTORY     hemorroid surgery   . TESTICLE REMOVAL Left   . TONSILLECTOMY          Home Medications    Prior to Admission medications   Medication Sig Start Date End Date Taking? Authorizing Provider  amitriptyline (ELAVIL) 25 MG tablet Take 25 mg by mouth at bedtime.    [provider]  atorvastatin (LIPITOR) 80 MG tablet Take 80 mg by mouth daily.    [provider]  azithromycin (ZITHROMAX) 250 MG tablet Take 1 tablet (250 mg total) by mouth daily. 03/31/18   Melene Plan, DO  benzonatate (TESSALON) 100 MG capsule Take 1 capsule (100 mg total) by mouth every 8 (eight) hours. 08/06/17   Rolland Porter, MD  carisoprodol (SOMA) 350 MG tablet Take 1 tablet (350 mg total) 3 (three) times daily as needed by mouth for muscle spasms. 10/13/17  Loren Racer, MD  clindamycin (CLEOCIN) 300 MG capsule Take 1 capsule (300 mg total) by mouth 3 (three) times daily. 07/03/18   Raeford Razor, MD  clotrimazole (LOTRIMIN) 1 % cream Apply to affected area 2 times daily until it is resolved. 11/26/17   Alvira Monday, MD  fluticasone-salmeterol (ADVAIR HFA) 161-09 MCG/ACT inhaler Inhale 2 puffs into the lungs daily.    [provider]  gabapentin (NEURONTIN) 300 MG capsule Take 300 mg by mouth 2 (two) times daily.     [provider]  insulin glargine (LANTUS) 100 UNIT/ML injection Inject 45 Units into the skin at bedtime.     [provider]  insulin lispro (HUMALOG) 100 UNIT/ML injection Inject 6 Units into the skin 3 (three)  times daily before meals. Sliding scale    [provider]  levETIRAcetam (KEPPRA) 750 MG tablet Take 1 tablet (750 mg total) by mouth 2 (two) times daily. 07/05/17   Lonia Blood, MD  lidocaine (LIDODERM) 5 % Place 1 patch onto the skin daily. Remove & Discard patch within 12 hours or as directed by MD 05/16/18   Petrucelli, Lelon Mast R, PA-C  lidocaine (XYLOCAINE) 2 % solution Use as directed 15 mLs in the mouth or throat as needed for mouth pain. 03/26/18   Rancour, Jeannett Senior, MD  losartan (COZAAR) 50 MG tablet Take 50 mg by mouth daily.    [provider]  metFORMIN (GLUCOPHAGE) 500 MG tablet Take 1,000 mg by mouth 2 (two) times daily with a meal.     [provider]  nortriptyline (PAMELOR) 50 MG capsule Take 50 mg at bedtime by mouth.    [provider]  promethazine (PHENERGAN) 25 MG tablet Take 1 tablet (25 mg total) by mouth every 6 (six) hours as needed for nausea or vomiting. 05/04/17   Vanetta Mulders, MD  promethazine-dextromethorphan (PROMETHAZINE-DM) 6.25-15 MG/5ML syrup Take 5 mLs by mouth 4 (four) times daily as needed for cough. 08/06/17   Rolland Porter, MD  rizatriptan (MAXALT) 10 MG tablet Take 10 mg by mouth as needed for migraine. May repeat in 2 hours if needed    [provider]  sucralfate (CARAFATE) 1 GM/10ML suspension Take 10 mLs (1 g total) by mouth 4 (four) times daily -  with meals and at bedtime. 06/09/17   Palumbo, April, MD    Family History History reviewed. No pertinent family history.  Social History Social History   Tobacco Use  . Smoking status: Former Smoker    Packs/day: 0.25    Years: 15.00    Pack years: 3.75    Types: Cigarettes    Last attempt to quit: 11/08/1992    Years since quitting: 25.7  . Smokeless tobacco: Never Used  Substance Use Topics  . Alcohol use: No  . Drug use: No     Allergies   Nsaids; Tramadol; Amoxicillin; Ampicillin; Dilantin [phenytoin]; Penicillins; Risperidone and related;  Strawberry extract; Bactrim [sulfamethoxazole-trimethoprim]; Depakote [divalproex sodium]; Flexeril [cyclobenzaprine]; Ultram [tramadol hcl]; and Oatmeal   Review of Systems Review of Systems  Constitutional: Positive for activity change.  Respiratory: Negative for shortness of breath.   Cardiovascular: Negative for chest pain.  Gastrointestinal: Negative for nausea and vomiting.  Musculoskeletal: Positive for neck pain.  Neurological: Positive for speech difficulty, weakness and headaches.  All other systems reviewed and are negative.    Physical Exam Updated Vital Signs BP 120/90   Pulse 93   Temp 98.7 F (37.1 C) (Oral)   Resp 16  SpO2 94%   Physical Exam  Constitutional: He is oriented to person, place, and time. He appears well-developed.  HENT:  Head: Atraumatic.  Eyes: Pupils are equal, round, and reactive to light. EOM are normal.  Neck:  In c-collar  Cardiovascular: Normal rate.  Pulmonary/Chest: Effort normal.  Abdominal: Soft. There is no tenderness.  Neurological: He is alert and oriented to person, place, and time.  4+ out of 5 upper and lower extremity strength. Left-sided subjective numbness in the lower extremity.  Skin: Skin is warm.  Nursing note and vitals reviewed.    ED Treatments / Results  Labs (all labs ordered are listed, but only abnormal results are displayed) Labs Reviewed  BASIC METABOLIC PANEL - Abnormal; Notable for the following components:      Result Value   Glucose, Bld 331 (*)    Creatinine, Ser 1.28 (*)    All other components within normal limits  CBC WITH DIFFERENTIAL/PLATELET - Abnormal; Notable for the following components:   Platelets 147 (*)    All other components within normal limits  CBG MONITORING, ED - Abnormal; Notable for the following components:   Glucose-Capillary 303 (*)    All other components within normal limits  MAGNESIUM    EKG EKG Interpretation  Date/Time:  Friday July 29 2018 05:35:36  EDT Ventricular Rate:  96 PR Interval:    QRS Duration: 87 QT Interval:  345 QTC Calculation: 436 R Axis:   -14 Text Interpretation:  Sinus rhythm Borderline T wave abnormalities No acute changes Nonspecific T wave abnormality - flattening in the inferior leads No significant change since last tracing Confirmed by Derwood Kaplan ([id number]) on 07/29/2018 6:42:29 AM   Radiology Ct Head Wo Contrast  Result Date: 07/29/2018 CLINICAL DATA:  Unwitnessed fall. Headache, post traumatic; Polytrauma, critical, head/C-spine injury suspected EXAM: CT HEAD WITHOUT CONTRAST CT CERVICAL SPINE WITHOUT CONTRAST TECHNIQUE: Multidetector CT imaging of the head and cervical spine was performed following the standard protocol without intravenous contrast. Multiplanar CT image reconstructions of the cervical spine were also generated. COMPARISON:  Head CT 06/26/2018, straight and cervical spine CT 07/02/2017 FINDINGS: CT HEAD FINDINGS Brain: No intracranial hemorrhage, mass effect, or midline shift. No hydrocephalus. The basilar cisterns are patent. No evidence of territorial infarct or acute ischemia. No extra-axial or intracranial fluid collection. Vascular: No hyperdense vessel. Skull: No fracture or focal lesion. Sinuses/Orbits: Paranasal sinuses and mastoid air cells are clear. The visualized orbits are unremarkable. Other: None. CT CERVICAL SPINE FINDINGS Alignment: No traumatic subluxation. C4 through C7 fusion causes mild straightening of normal lordosis. Skull base and vertebrae: Anterior C4 through C7 fusion. Hardware is intact. No acute fracture. Vertebral body heights are maintained. The dens and skull base are intact. Soft tissues and spinal canal: No prevertebral fluid or swelling. No visible canal hematoma. Disc levels: Anterior C4 through C7 fusion. Fusion appears solid C5 through C7. Pseudoarthrosis at C4-C5 at level of anterior fusion unchanged from prior exam. Endplate spurring at C3-C4 unchanged. Upper  chest: Negative. Other: None. IMPRESSION: 1.  No acute intracranial abnormality.  No skull fracture. 2. Degenerative and postsurgical change in the cervical spine post ACDF without acute fracture or subluxation. 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 Electronically Signed   By: Rubye Oaks M.D.   On: 07/29/2018 06:40   Ct Cervical Spine Wo Contrast  Result Date: 07/29/2018 CLINICAL DATA:  Unwitnessed fall. Headache, post traumatic; Polytrauma, critical, head/C-spine injury suspected EXAM: CT HEAD WITHOUT CONTRAST CT CERVICAL SPINE WITHOUT CONTRAST TECHNIQUE:  Multidetector CT imaging of the head and cervical spine was performed following the standard protocol without intravenous contrast. Multiplanar CT image reconstructions of the cervical spine were also generated. COMPARISON:  Head CT 06/26/2018, straight and cervical spine CT 07/02/2017 FINDINGS: CT HEAD FINDINGS Brain: No intracranial hemorrhage, mass effect, or midline shift. No hydrocephalus. The basilar cisterns are patent. No evidence of territorial infarct or acute ischemia. No extra-axial or intracranial fluid collection. Vascular: No hyperdense vessel. Skull: No fracture or focal lesion. Sinuses/Orbits: Paranasal sinuses and mastoid air cells are clear. The visualized orbits are unremarkable. Other: None. CT CERVICAL SPINE FINDINGS Alignment: No traumatic subluxation. C4 through C7 fusion causes mild straightening of normal lordosis. Skull base and vertebrae: Anterior C4 through C7 fusion. Hardware is intact. No acute fracture. Vertebral body heights are maintained. The dens and skull base are intact. Soft tissues and spinal canal: No prevertebral fluid or swelling. No visible canal hematoma. Disc levels: Anterior C4 through C7 fusion. Fusion appears solid C5 through C7. Pseudoarthrosis at C4-C5 at level of anterior fusion unchanged from prior exam. Endplate spurring at C3-C4 unchanged. Upper chest: Negative. Other: None.  IMPRESSION: 1.  No acute intracranial abnormality.  No skull fracture. 2. Degenerative and postsurgical change in the cervical spine post ACDF without acute fracture or subluxation. 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 Electronically Signed   By: Rubye Oaks M.D.   On: 07/29/2018 06:40    Procedures Procedures (including critical care time)  Medications Ordered in ED Medications  levETIRAcetam (KEPPRA) IVPB 1000 mg/100 mL premix (0 mg Intravenous Stopped 07/29/18 0649)     Initial Impression / Assessment and Plan / ED Course  I have reviewed the triage vital signs and the nursing notes.  Pertinent labs & imaging results that were available during my care of the patient were reviewed by me and considered in my medical decision making (see chart for details).  Clinical Course as of Jul 29 722  Fri Jul 29, 2018  5784 Patient C-spine has been cleared post negative CT scan. On repeat exam he continues to have left lower extremity numbness.  MRI has been ordered.  Dr. Silverio Lay to follow-up.   [AN]    Clinical Course User Index [AN] Derwood Kaplan, MD    57 year old male comes in with chief complaint of fall.  Patient has history of seizure disorder, and it appears that he was postictal when EMS arrived.  Patient is now more alert, he is unsure what led him to fall down.  His seizures are usually well controlled, last episode was in April.  No signs of tongue biting and there is no incontinence.  We will give him his IV Keppra load.  CT head and C-spine ordered, since patient is having pain at both of those sites.  Additionally, patient is complaining of left lower extremity numbness.  Motor exam for upper and lower extremity shows 4+ out of 5 strength.  He denies any previous history of postictal phase with left lower extremity weakness, which is unusual.  There is no history of strokes.  Plan is to get CT head, C-spine.  We will not call code stroke given the unclear  status of the underlying process, and for the mere fact that patient likely is not a candidate for TPA given his fall and possible seizures.  Finally, review of system was negative for fevers, chills or any underlying infectious process.  Final Clinical Impressions(s) / ED Diagnoses   Final diagnoses:  None  ED Discharge Orders    None       Derwood KaplanNanavati, Keiden Deskin, MD 07/29/18 [id number]    Derwood KaplanNanavati, Vernell Townley, MD 07/29/18 [phone]

## 2018-07-29 NOTE — ED Notes (Signed)
Patient transported to MRI 

## 2018-09-15 ENCOUNTER — Encounter (HOSPITAL_BASED_OUTPATIENT_CLINIC_OR_DEPARTMENT_OTHER): Payer: Self-pay | Admitting: *Deleted

## 2018-09-15 ENCOUNTER — Emergency Department (HOSPITAL_BASED_OUTPATIENT_CLINIC_OR_DEPARTMENT_OTHER): Payer: Medicare Other

## 2018-09-15 ENCOUNTER — Emergency Department (HOSPITAL_BASED_OUTPATIENT_CLINIC_OR_DEPARTMENT_OTHER)
Admission: EM | Admit: 2018-09-15 | Discharge: 2018-09-16 | Disposition: A | Discharge status: Home / Self Care | Payer: Medicare Other | Attending: Emergency Medicine | Admitting: Emergency Medicine

## 2018-09-15 ENCOUNTER — Emergency Department (HOSPITAL_COMMUNITY): Payer: Medicare Other

## 2018-09-15 ENCOUNTER — Other Ambulatory Visit: Payer: Self-pay

## 2018-09-15 DIAGNOSIS — N182 Chronic kidney disease, stage 2 (mild): Secondary | ICD-10-CM | POA: Diagnosis not present

## 2018-09-15 DIAGNOSIS — Z794 Long term (current) use of insulin: Secondary | ICD-10-CM | POA: Diagnosis not present

## 2018-09-15 DIAGNOSIS — Z79899 Other long term (current) drug therapy: Secondary | ICD-10-CM | POA: Insufficient documentation

## 2018-09-15 DIAGNOSIS — M7989 Other specified soft tissue disorders: Secondary | ICD-10-CM | POA: Diagnosis present

## 2018-09-15 DIAGNOSIS — E1122 Type 2 diabetes mellitus with diabetic chronic kidney disease: Secondary | ICD-10-CM | POA: Insufficient documentation

## 2018-09-15 DIAGNOSIS — Z87891 Personal history of nicotine dependence: Secondary | ICD-10-CM | POA: Insufficient documentation

## 2018-09-15 DIAGNOSIS — I129 Hypertensive chronic kidney disease with stage 1 through stage 4 chronic kidney disease, or unspecified chronic kidney disease: Secondary | ICD-10-CM | POA: Diagnosis not present

## 2018-09-15 DIAGNOSIS — R51 Headache: Secondary | ICD-10-CM | POA: Diagnosis not present

## 2018-09-15 DIAGNOSIS — R29898 Other symptoms and signs involving the musculoskeletal system: Secondary | ICD-10-CM

## 2018-09-15 DIAGNOSIS — M6281 Muscle weakness (generalized): Secondary | ICD-10-CM | POA: Diagnosis not present

## 2018-09-15 DIAGNOSIS — R200 Anesthesia of skin: Secondary | ICD-10-CM | POA: Diagnosis not present

## 2018-09-15 LAB — CBC
HCT: 41.3 % (ref 39.0–52.0)
Hemoglobin: 13 g/dL (ref 13.0–17.0)
MCH: 27.4 pg (ref 26.0–34.0)
MCHC: 31.5 g/dL (ref 30.0–36.0)
MCV: 87.1 fL (ref 80.0–100.0)
Platelets: 148 10*3/uL — ABNORMAL LOW (ref 150–400)
RBC: 4.74 MIL/uL (ref 4.22–5.81)
RDW: 14.6 % (ref 11.5–15.5)
WBC: 4.2 10*3/uL (ref 4.0–10.5)
nRBC: 0 % (ref 0.0–0.2)

## 2018-09-15 LAB — RAPID URINE DRUG SCREEN, HOSP PERFORMED
Amphetamines: NOT DETECTED
Barbiturates: NOT DETECTED
Benzodiazepines: NOT DETECTED
Cocaine: NOT DETECTED
Opiates: NOT DETECTED
Tetrahydrocannabinol: NOT DETECTED

## 2018-09-15 LAB — URINALYSIS, MICROSCOPIC (REFLEX): WBC, UA: NONE SEEN WBC/hpf (ref 0–5)

## 2018-09-15 LAB — DIFFERENTIAL
Abs Immature Granulocytes: 0.02 10*3/uL (ref 0.00–0.07)
Basophils Absolute: 0 10*3/uL (ref 0.0–0.1)
Basophils Relative: 1 %
Eosinophils Absolute: 0.1 10*3/uL (ref 0.0–0.5)
Eosinophils Relative: 2 %
Immature Granulocytes: 1 %
Lymphocytes Relative: 32 %
Lymphs Abs: 1.4 10*3/uL (ref 0.7–4.0)
Monocytes Absolute: 0.3 10*3/uL (ref 0.1–1.0)
Monocytes Relative: 8 %
Neutro Abs: 2.4 10*3/uL (ref 1.7–7.7)
Neutrophils Relative %: 56 %

## 2018-09-15 LAB — COMPREHENSIVE METABOLIC PANEL
ALT: 51 U/L — ABNORMAL HIGH (ref 0–44)
AST: 36 U/L (ref 15–41)
Albumin: 4.2 g/dL (ref 3.5–5.0)
Alkaline Phosphatase: 53 U/L (ref 38–126)
Anion gap: 9 (ref 5–15)
BUN: 17 mg/dL (ref 6–20)
CO2: 25 mmol/L (ref 22–32)
Calcium: 9.7 mg/dL (ref 8.9–10.3)
Chloride: 106 mmol/L (ref 98–111)
Creatinine, Ser: 1.12 mg/dL (ref 0.61–1.24)
GFR calc Af Amer: >60 mL/min (ref >60)
GFR calc non Af Amer: >60 mL/min (ref >60)
Glucose, Bld: 335 mg/dL — ABNORMAL HIGH (ref 70–99)
Potassium: 3.9 mmol/L (ref 3.5–5.1)
Sodium: 140 mmol/L (ref 135–145)
Total Bilirubin: 0.4 mg/dL (ref 0.3–1.2)
Total Protein: 7 g/dL (ref 6.5–8.1)

## 2018-09-15 LAB — ETHANOL: Alcohol, Ethyl (B): <10 mg/dL (ref <10)

## 2018-09-15 LAB — PROTIME-INR
INR: 1.1
Prothrombin Time: 14.1 seconds (ref 11.4–15.2)

## 2018-09-15 LAB — URINALYSIS, ROUTINE W REFLEX MICROSCOPIC
Bilirubin Urine: NEGATIVE
Glucose, UA: >=500 mg/dL — AB
Hgb urine dipstick: NEGATIVE
Ketones, ur: NEGATIVE mg/dL
Leukocytes, UA: NEGATIVE
Nitrite: NEGATIVE
Protein, ur: NEGATIVE mg/dL
Specific Gravity, Urine: 1.01 (ref 1.005–1.030)
pH: 6 (ref 5.0–8.0)

## 2018-09-15 LAB — APTT: aPTT: 30 seconds (ref 24–36)

## 2018-09-15 LAB — TROPONIN I: Troponin I: <0.03 ng/mL (ref <0.03)

## 2018-09-15 MED ORDER — METOCLOPRAMIDE HCL 5 MG/ML IJ SOLN
10.0000 mg | Freq: Once | INTRAMUSCULAR | Status: AC
  Administered 2018-09-15: 10 mg via INTRAVENOUS
  Filled 2018-09-15: qty 2

## 2018-09-15 MED ORDER — DIPHENHYDRAMINE HCL 50 MG/ML IJ SOLN
25.0000 mg | Freq: Once | INTRAMUSCULAR | Status: AC
  Administered 2018-09-15: 25 mg via INTRAVENOUS
  Filled 2018-09-15: qty 1

## 2018-09-15 MED ORDER — GADOBUTROL 1 MMOL/ML IV SOLN
10.0000 mL | Freq: Once | INTRAVENOUS | Status: AC | PRN
  Administered 2018-09-16: 10 mL via INTRAVENOUS

## 2018-09-15 NOTE — ED Notes (Signed)
Patient transported to MRI 

## 2018-09-15 NOTE — ED Notes (Signed)
Pt states he had a a stroke in september of this year. He had a headache yesterday and bilateral swelling to legs. He also stats that he has bilateral lower back pain that "feels like im breaking in half". He states that he "feels like I did when I had my stroke,". He states he is having weakness and feels tightness from the swelling. He was at work yesterday when he started feeling weakness in his left arm. Pt states that he feels like his right shoulder is up and his left shoulder is down.

## 2018-09-15 NOTE — ED Triage Notes (Signed)
Pt c/o bil leg swelling x 1 week

## 2018-09-15 NOTE — ED Notes (Signed)
Pt endorses waking up at 0400 and not being able to get up. Per [facility], pt has left sided weakness, decreased sensation, and blurry vision. Pt endorses back pain. [facility] reports similar s/s in September, was given TPa then. Pt is alert and oriented.

## 2018-09-15 NOTE — ED Notes (Signed)
Patient transported to CT 

## 2018-09-15 NOTE — ED Provider Notes (Signed)
[facility] EMERGENCY DEPARTMENT Provider Note   CSN: [id number] Arrival date & time: 09/15/18  1654     History   Chief Complaint Chief Complaint  Patient presents with  . Leg Swelling    HPI Nicholas [name] is a 57 y.o. male.  Patient with complex past medical history including seizure disorder, bipolar disorder, chronic pain, migraine, diabetes --presents the emergency department for complaint of leg swelling.  Further questioning reveals worsening weakness in the left arm and left leg with associated headache starting yesterday morning.  Patient first noticed the symptoms when he got off of work and was driven home at approximately noon yesterday.  Patient was just admitted to [facility] hospital in September 2019.  He had worsening lower extremity weakness at that time and was given TPA.  He had an MRI without acute findings.  He had a CTA of the head and neck which was negative.  Patient followed up with his neurologist [facility] on 9/30.  That note also makes mention of history of somatization disorder, pseudoseizures.  Patient describes the headache as a pressure type pain.  He denies any acute neck pain.  He has chronic lower back pain which is worse with movement.  He states that he has had frequent urination.  He does not describe any saddle paresthesias, fecal incontinence, or urinary retention.  No injuries to the back.  No significant chest pain or shortness of breath.     Past Medical History:  Diagnosis Date  . Asthma   . Bipolar 1 disorder (HCC)   . Borderline glaucoma   . Chronic pain   . Epididymal pain    LEFT  . 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 Feeling of incomplete bladder emptying   . Frequency of urination   . Gastric ulcer   . GERD (gastroesophageal reflux disease)   . Hypertension   . Hyperthyroidism    NO MEDS   . Migraine   .  Seizures (HCC)   . Type 2 diabetes mellitus [facility])     Patient Active Problem List   Diagnosis Date Noted  . Essential hypertension   . Seizure disorder (HCC) 07/02/2017  . Insulin-requiring or dependent type II diabetes mellitus (HCC) 07/02/2017  . CKD (chronic kidney disease), stage II 07/02/2017  . Normocytic anemia 07/02/2017  . Testicular/scrotal pain 11/09/2013  . Microhematuria 11/09/2013  . Condyloma acuminatum of scrotum 11/09/2013    Past Surgical History:  Procedure Laterality Date  . ANTERIOR CERVICAL DECOMP/DISCECTOMY FUSION  2007   C4  --  C6  . CERVICAL FUSION    . CHOLECYSTECTOMY    . CYSTOSCOPY N/A 11/09/2013   Procedure: CYSTOSCOPY FLEXIBLE;  Surgeon: Bjorn Pippin, MD;  Location: [facility];  Service: Urology;  Laterality: N/A;  . EPIDIDYMECTOMY Left 11/09/2013   Procedure:  LEFT EPIDIDYMECTOMY;  Surgeon: Bjorn Pippin, MD;  Location: [facility];  Service: Urology;  Laterality: Left;  POSSIBLE OUTPATIENT WITH OBSERVATION  . EXCISION LIPOMA LEFT SHOULDER  2004 (APPROX)  . MULTIPLE CYST REMOVED FROM CHEST  AGE 6  . OTHER SURGICAL HISTORY     hemorroid surgery   . TESTICLE REMOVAL Left   . TONSILLECTOMY          Home Medications    Prior to Admission medications   Medication Sig Start Date  End Date Taking? Authorizing Provider  amitriptyline (ELAVIL) 25 MG tablet Take 25 mg by mouth at bedtime.    [provider]  atorvastatin (LIPITOR) 80 MG tablet Take 80 mg by mouth daily.    [provider]  azithromycin (ZITHROMAX) 250 MG tablet Take 1 tablet (250 mg total) by mouth daily. 03/31/18   Melene Plan, DO  benzonatate (TESSALON) 100 MG capsule Take 1 capsule (100 mg total) by mouth every 8 (eight) hours. 08/06/17   Rolland Porter, MD  carisoprodol (SOMA) 350 MG tablet Take 1 tablet (350 mg total) 3 (three) times daily as needed by mouth for muscle spasms. 10/13/17   Loren Racer, MD  clindamycin (CLEOCIN) 300 MG  capsule Take 1 capsule (300 mg total) by mouth 3 (three) times daily. 07/03/18   Raeford Razor, MD  clotrimazole (LOTRIMIN) 1 % cream Apply to affected area 2 times daily until it is resolved. 11/26/17   Alvira Monday, MD  fluticasone-salmeterol (ADVAIR HFA) 161-09 MCG/ACT inhaler Inhale 2 puffs into the lungs daily.    [provider]  gabapentin (NEURONTIN) 300 MG capsule Take 300 mg by mouth 2 (two) times daily.     [provider]  insulin glargine (LANTUS) 100 UNIT/ML injection Inject 45 Units into the skin at bedtime.     [provider]  insulin lispro (HUMALOG) 100 UNIT/ML injection Inject 6 Units into the skin 3 (three) times daily before meals. Sliding scale    [provider]  levETIRAcetam (KEPPRA) 250 MG tablet Take 1 tablet (250 mg total) by mouth 2 (two) times daily. 07/29/18   Charlynne Pander, MD  lidocaine (LIDODERM) 5 % Place 1 patch onto the skin daily. Remove & Discard patch within 12 hours or as directed by MD 05/16/18   Petrucelli, Lelon Mast R, PA-C  lidocaine (XYLOCAINE) 2 % solution Use as directed 15 mLs in the mouth or throat as needed for mouth pain. 03/26/18   Rancour, Jeannett Senior, MD  losartan (COZAAR) 50 MG tablet Take 50 mg by mouth daily.    [provider]  metFORMIN (GLUCOPHAGE) 500 MG tablet Take 1,000 mg by mouth 2 (two) times daily with a meal.     [provider]  nortriptyline (PAMELOR) 50 MG capsule Take 50 mg at bedtime by mouth.    [provider]  promethazine (PHENERGAN) 25 MG tablet Take 1 tablet (25 mg total) by mouth every 6 (six) hours as needed for nausea or vomiting. 05/04/17   Vanetta Mulders, MD  promethazine-dextromethorphan (PROMETHAZINE-DM) 6.25-15 MG/5ML syrup Take 5 mLs by mouth 4 (four) times daily as needed for cough. 08/06/17   Rolland Porter, MD  rizatriptan (MAXALT) 10 MG tablet Take 10 mg by mouth as needed for migraine. May repeat in 2 hours if needed    [provider]    sucralfate (CARAFATE) 1 GM/10ML suspension Take 10 mLs (1 g total) by mouth 4 (four) times daily -  with meals and at bedtime. 06/09/17   Palumbo, April, MD    Family History History reviewed. No pertinent family history.  Social History Social History   Tobacco Use  . Smoking status: Former Smoker    Packs/day: 0.25    Years: 15.00    Pack years: 3.75    Types: Cigarettes    Last attempt to quit: 11/08/1992    Years since quitting: 25.8  . Smokeless tobacco: Never Used  Substance Use Topics  . Alcohol use: No  . Drug use: No  Allergies   Nsaids; Tramadol; Amoxicillin; Ampicillin; Dilantin [phenytoin]; Penicillins; Risperidone and related; Strawberry extract; Bactrim [sulfamethoxazole-trimethoprim]; Depakote [divalproex sodium]; Flexeril [cyclobenzaprine]; Ultram [tramadol hcl]; and Oatmeal   Review of Systems Review of Systems  Constitutional: Negative for fever.  HENT: Negative for rhinorrhea and sore throat.   Eyes: Negative for redness.  Respiratory: Negative for cough.   Cardiovascular: Negative for chest pain.  Gastrointestinal: Negative for abdominal pain, diarrhea, nausea and vomiting.  Genitourinary: Negative for dysuria.  Musculoskeletal: Positive for back pain. Negative for myalgias.  Skin: Negative for rash.  Neurological: Positive for weakness, numbness and headaches. Negative for seizures and syncope.     Physical Exam Updated Vital Signs BP (!) 133/93   Pulse 95   Temp 97.7 F (36.5 C)   Resp 18   Ht 5\' 4"  (1.626 m)   Wt 102 kg   SpO2 98%   BMI 38.60 kg/m   Physical Exam  Constitutional: He is oriented to person, place, and time. He appears well-developed and well-nourished.  HENT:  Head: Normocephalic and atraumatic.  Right Ear: Tympanic membrane, external ear and ear canal normal.  Left Ear: Tympanic membrane, external ear and ear canal normal.  Nose: Nose normal.  Mouth/Throat: Uvula is midline, oropharynx is clear and moist and  mucous membranes are normal.  Eyes: Pupils are equal, round, and reactive to light. Conjunctivae, EOM and lids are normal. Right eye exhibits no discharge. Left eye exhibits no discharge.  Neck: Normal range of motion. Neck supple.  Cardiovascular: Normal rate, regular rhythm and normal heart sounds.  Pulmonary/Chest: Effort normal and breath sounds normal.  Abdominal: Soft. There is no tenderness.  Musculoskeletal: Normal range of motion.       Cervical back: He exhibits normal range of motion, no tenderness and no bony tenderness.  Neurological: He is alert and oriented to person, place, and time. He has normal reflexes. A sensory deficit is present. No cranial nerve deficit. He exhibits normal muscle tone. He displays a negative Romberg sign. Coordination and gait normal. GCS eye subscore is 4. GCS verbal subscore is 5. GCS motor subscore is 6.  Patient cannot feel sharp/dull anywhere on L leg, L arm and onto L chest wall. Trace decreased strength in the L upper and lower extremity. He is able to hold leg off of the bed on the right, but has trouble doing so on the left.   Skin: Skin is warm and dry.  Psychiatric: He has a normal mood and affect.  Nursing note and vitals reviewed.    ED Treatments / Results  Labs (all labs ordered are listed, but only abnormal results are displayed) Labs Reviewed  CBC - Abnormal; Notable for the following components:      Result Value   Platelets 148 (*)    All other components within normal limits  COMPREHENSIVE METABOLIC PANEL - Abnormal; Notable for the following components:   Glucose, Bld 335 (*)    ALT 51 (*)    All other components within normal limits  URINALYSIS, ROUTINE W REFLEX MICROSCOPIC - Abnormal; Notable for the following components:   Glucose, UA >=500 (*)    All other components within normal limits  URINALYSIS, MICROSCOPIC (REFLEX) - Abnormal; Notable for the following components:   Bacteria, UA RARE (*)    All other components  within normal limits  ETHANOL  PROTIME-INR  APTT  DIFFERENTIAL  TROPONIN I  RAPID URINE DRUG SCREEN, HOSP PERFORMED    EKG EKG Interpretation  Date/Time:  Thursday September 15 2018 17:10:43 EDT Ventricular Rate:  90 PR Interval:    QRS Duration: 89 QT Interval:  359 QTC Calculation: 440 R Axis:   -15 Text Interpretation:  Sinus rhythm Borderline left axis deviation No significant change since last tracing Confirmed by Alvira Monday ([id number]) on 09/15/2018 6:52:39 PM   Radiology Ct Head Wo Contrast  Result Date: 09/15/2018 CLINICAL DATA:  Pt states he had a a stroke in september of this year. He had a headache yesterday and bilateral swelling to legs. He also stats that he has bilateral lower back pain that "feels like im breaking in half". He states that he "feels like I did when I had my stroke,". He states he is having weakness and feels tightness from the swelling. He was at work yesterday when he started feeling weakness in his left arm. Pt states that he feels like his right shoulder is up and his left shoulder is down. EXAM: CT HEAD WITHOUT CONTRAST TECHNIQUE: Contiguous axial images were obtained from the base of the skull through the vertex without intravenous contrast. COMPARISON:  Brain MRI and head CT, 08/22/2018. FINDINGS: Brain: No evidence of acute infarction, hemorrhage, hydrocephalus, extra-axial collection or mass lesion/mass effect. Vascular: No hyperdense vessel or unexpected calcification. Skull: Normal. Negative for fracture or focal lesion. Sinuses/Orbits: Normal globes and orbits. Visualized sinuses and mastoid air cells are clear. Other: None. IMPRESSION: Normal unenhanced CT scan of the brain. Electronically Signed   By: Amie Portland M.D.   On: 09/15/2018 18:23    Procedures Procedures (including critical care time)  Medications Ordered in ED Medications  metoCLOPramide (REGLAN) injection 10 mg (10 mg Intravenous Given 09/15/18 1758)  diphenhydrAMINE  (BENADRYL) injection 25 mg (25 mg Intravenous Given 09/15/18 1804)     Initial Impression / Assessment and Plan / ED Course  I have reviewed the triage vital signs and the nursing notes.  Pertinent labs & imaging results that were available during my care of the patient were reviewed by me and considered in my medical decision making (see chart for details).     Patient seen and examined. No code stroke as symptoms have started greater than 24 hours ago.  Given migraine history, will treat for complex migraines while stroke work-up is undertaken.  Vital signs reviewed and are as follows: BP (!) 133/93   Pulse 95   Temp 97.7 F (36.5 C)   Resp 18   Ht 5\' 4"  (1.626 m)   Wt 102 kg   SpO2 98%   BMI 38.60 kg/m   7:28 PM patient discussed with and seen by Dr. Dalene Seltzer.  Patient with continued symptoms although headache is now improved.  Spoke with Dr. Laurence Slate. Plan is to send to [facility] for MRI imaging of the head and cervical spine.  If negative, patient can likely be discharged in light of recent work-up.  If positive, call neurology back for consult.  I spoke with Dr. Melene Plan who is aware of patient going to [facility] emergency department.  Final Clinical Impressions(s) / ED Diagnoses   Final diagnoses:  Upper extremity weakness  Weakness of left lower extremity  Numbness   Transfer to [facility] for MRI.   ED Discharge Orders    None       Renne Crigler, PA-C 09/15/18 1931    Alvira Monday, MD 09/16/18 1221

## 2018-09-15 NOTE — ED Notes (Signed)
Pt on monitor 

## 2018-09-15 NOTE — ED Notes (Signed)
ED Provider at bedside. 

## 2018-09-16 DIAGNOSIS — M6281 Muscle weakness (generalized): Secondary | ICD-10-CM | POA: Diagnosis not present

## 2018-09-16 MED ORDER — HYDROCODONE-ACETAMINOPHEN 5-325 MG PO TABS
1.0000 | ORAL_TABLET | Freq: Once | ORAL | Status: AC
  Administered 2018-09-16: 1 via ORAL

## 2018-09-16 MED ORDER — HYDROCODONE-ACETAMINOPHEN 5-325 MG PO TABS
ORAL_TABLET | ORAL | Status: AC
  Filled 2018-09-16: qty 1

## 2018-09-16 NOTE — Discharge Instructions (Signed)
Please follow up with neurology by calling the office to schedule an appointment.

## 2018-09-16 NOTE — ED Provider Notes (Signed)
Nicholas [name] is transferred from [facility] for MRI brain and neck in evaluation of recurrent left hemiparesis that started today. He was admitted with similar symptoms last month, received TPA on arrival and had subsequent negative studies. On discharge, the patient's DDx was felt to be hemiplegic migraine vs somatization disorder.   He was initially evaluated at [facility] by Rhea Bleacher, PA-C, who discussed with patient's presentation with neurology. Dr. Laurence Slate recommended transfer for repeat MRI's with the plan to consult any positive findings and discharge home to outpatient follow up any negative findings.   MRI brain and neck are negative here. Reassessment finds the patient continuing to c/o left arm and leg weakness and numbness. Reflexes are present, equal and symmetric. No facial asymmetry. CN's 3-12 intact. He is able to move the left leg but is weak when compared to right. He is able to keep left arm lifted symmetrically with the right. There is no LE swelling noted.   Discussed negative studies with the patient and his wife. He reports he does not have a neurologist to follow up with because "we didn't get along" so he left her practice in [facility]. Will refer to neurology for further outpatient management. The patient reports he has a cane and a walker for ambulation and feels comfortable with discharge home tonight. He is requesting a note for work.   He also reports back pain which is chronic for him, previously managed by Pain Management but not currently. No new characteristics of back pain for him, just uncontrolled pain for which he was being given Hydrocodone. Will provide single dose in ED and explained that a Rx would not be given from the ED. The patient acknowledged understanding.   Elpidio Anis, PA-C 09/16/18 0058    Melene Plan, DO 09/19/18 [phone]

## 2018-10-28 ENCOUNTER — Emergency Department (HOSPITAL_BASED_OUTPATIENT_CLINIC_OR_DEPARTMENT_OTHER): Payer: Medicare Other

## 2018-10-28 ENCOUNTER — Emergency Department (HOSPITAL_BASED_OUTPATIENT_CLINIC_OR_DEPARTMENT_OTHER)
Admission: EM | Admit: 2018-10-28 | Discharge: 2018-10-28 | Disposition: A | Discharge status: Home / Self Care | Payer: Medicare Other | Attending: Emergency Medicine | Admitting: Emergency Medicine

## 2018-10-28 ENCOUNTER — Other Ambulatory Visit: Payer: Self-pay

## 2018-10-28 ENCOUNTER — Encounter (HOSPITAL_BASED_OUTPATIENT_CLINIC_OR_DEPARTMENT_OTHER): Payer: Self-pay

## 2018-10-28 DIAGNOSIS — F319 Bipolar disorder, unspecified: Secondary | ICD-10-CM | POA: Diagnosis not present

## 2018-10-28 DIAGNOSIS — Z8673 Personal history of transient ischemic attack (TIA), and cerebral infarction without residual deficits: Secondary | ICD-10-CM | POA: Insufficient documentation

## 2018-10-28 DIAGNOSIS — J069 Acute upper respiratory infection, unspecified: Secondary | ICD-10-CM | POA: Diagnosis not present

## 2018-10-28 DIAGNOSIS — Z87891 Personal history of nicotine dependence: Secondary | ICD-10-CM | POA: Insufficient documentation

## 2018-10-28 DIAGNOSIS — J45909 Unspecified asthma, uncomplicated: Secondary | ICD-10-CM | POA: Diagnosis not present

## 2018-10-28 DIAGNOSIS — I129 Hypertensive chronic kidney disease with stage 1 through stage 4 chronic kidney disease, or unspecified chronic kidney disease: Secondary | ICD-10-CM | POA: Insufficient documentation

## 2018-10-28 DIAGNOSIS — B9789 Other viral agents as the cause of diseases classified elsewhere: Secondary | ICD-10-CM

## 2018-10-28 DIAGNOSIS — E1122 Type 2 diabetes mellitus with diabetic chronic kidney disease: Secondary | ICD-10-CM | POA: Insufficient documentation

## 2018-10-28 DIAGNOSIS — R05 Cough: Secondary | ICD-10-CM | POA: Diagnosis present

## 2018-10-28 DIAGNOSIS — N182 Chronic kidney disease, stage 2 (mild): Secondary | ICD-10-CM | POA: Diagnosis not present

## 2018-10-28 DIAGNOSIS — Z9049 Acquired absence of other specified parts of digestive tract: Secondary | ICD-10-CM | POA: Insufficient documentation

## 2018-10-28 HISTORY — DX: Transient cerebral ischemic attack, unspecified: G45.9

## 2018-10-28 LAB — GROUP A STREP BY PCR: Group A Strep by PCR: NOT DETECTED

## 2018-10-28 MED ORDER — ALBUTEROL SULFATE HFA 108 (90 BASE) MCG/ACT IN AERS: 1.0000 | INHALATION_SPRAY | Freq: Four times a day (QID) | RESPIRATORY_TRACT | 0 refills | Status: DC | PRN

## 2018-10-28 MED ORDER — FLUTICASONE PROPIONATE 50 MCG/ACT NA SUSP: 2.0000 | Freq: Every day | NASAL | 0 refills | Status: DC

## 2018-10-28 MED ORDER — BENZONATATE 100 MG PO CAPS: 100.0000 mg | ORAL_CAPSULE | Freq: Three times a day (TID) | ORAL | 0 refills | Status: DC

## 2018-10-28 MED ORDER — IPRATROPIUM-ALBUTEROL 0.5-2.5 (3) MG/3ML IN SOLN
3.0000 mL | Freq: Four times a day (QID) | RESPIRATORY_TRACT | Status: DC
  Administered 2018-10-28: 3 mL via RESPIRATORY_TRACT
  Filled 2018-10-28: qty 3

## 2018-10-28 MED ORDER — DEXAMETHASONE SODIUM PHOSPHATE 10 MG/ML IJ SOLN
10.0000 mg | Freq: Once | INTRAMUSCULAR | Status: AC
  Administered 2018-10-28: 10 mg via INTRAMUSCULAR
  Filled 2018-10-28: qty 1

## 2018-10-28 MED FILL — PROAIR HFA 90 MCG INHALER: 108 (90 BAS | 25 days supply | Qty: 9 | Fill #0

## 2018-10-28 NOTE — ED Provider Notes (Signed)
Emergency Department Provider Note   I have reviewed the triage vital signs and the nursing notes.   HISTORY  Chief Complaint Cough   HPI Nicholas [name] is a 57 y.o. male with PMH asthma, bipolar disorder, and DM presents to the ED for evaluation of flu-like symptoms for the last 5 days. Patient reports cough, body aches, sore throat, and mild SOB. Some subjective fever at home. No nausea or vomiting. No radiation of symptoms or modifying factors. No sick contacts. Does not smoke tobacco.    Past Medical History:  Diagnosis Date  . Asthma   . Bipolar 1 disorder (HCC)   . Borderline glaucoma   . Chronic pain   . Epididymal pain    LEFT  . 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 Feeling of incomplete bladder emptying   . Frequency of urination   . Gastric ulcer   . GERD (gastroesophageal reflux disease)   . Hypertension   . Hyperthyroidism    NO MEDS   . Migraine   . Seizures (HCC)   . TIA (transient ischemic attack)   . Type 2 diabetes mellitus [facility])     Patient Active Problem List   Diagnosis Date Noted  . Essential hypertension   . Seizure disorder (HCC) 07/02/2017  . Insulin-requiring or dependent type II diabetes mellitus (HCC) 07/02/2017  . CKD (chronic kidney disease), stage II 07/02/2017  . Normocytic anemia 07/02/2017  . Testicular/scrotal pain 11/09/2013  . Microhematuria 11/09/2013  . Condyloma acuminatum of scrotum 11/09/2013    Past Surgical History:  Procedure Laterality Date  . ANTERIOR CERVICAL DECOMP/DISCECTOMY FUSION  2007   C4  --  C6  . CERVICAL FUSION    . CHOLECYSTECTOMY    . CYSTOSCOPY N/A 11/09/2013   Procedure: CYSTOSCOPY FLEXIBLE;  Surgeon: Bjorn Pippin, MD;  Location: [facility];  Service: Urology;  Laterality: N/A;  . EPIDIDYMECTOMY Left 11/09/2013   Procedure:  LEFT EPIDIDYMECTOMY;  Surgeon: Bjorn Pippin, MD;  Location:  [facility];  Service: Urology;  Laterality: Left;  POSSIBLE OUTPATIENT WITH OBSERVATION  . EXCISION LIPOMA LEFT SHOULDER  2004 (APPROX)  . MULTIPLE CYST REMOVED FROM CHEST  AGE 31  . OTHER SURGICAL HISTORY     hemorroid surgery   . TESTICLE REMOVAL Left   . TONSILLECTOMY       Allergies Nsaids; Tramadol; Amoxicillin; Ampicillin; Dilantin [phenytoin]; Penicillins; Risperidone and related; Strawberry extract; Bactrim [sulfamethoxazole-trimethoprim]; Depakote [divalproex sodium]; Flexeril [cyclobenzaprine]; Ultram [tramadol hcl]; and Oatmeal  No family history on file.  Social History Social History   Tobacco Use  . Smoking status: Former Smoker    Packs/day: 0.25    Years: 15.00    Pack years: 3.75    Types: Cigarettes    Last attempt to quit: 11/08/1992    Years since quitting: 25.9  . Smokeless tobacco: Never Used  Substance Use Topics  . Alcohol use: No  . Drug use: No    Review of Systems  Constitutional: Positive fever/chills and body aches.  ENT: Positive sore throat. Cardiovascular: Denies chest pain. Respiratory: Positive shortness of breath and cough.  Gastrointestinal: No abdominal pain.  No nausea, no vomiting.  No diarrhea.  No constipation. Genitourinary: Negative for dysuria. Musculoskeletal: Negative for back pain. Skin: Negative for rash. Neurological: Negative for headaches, focal weakness or numbness.  10-point ROS otherwise  negative.  ____________________________________________   PHYSICAL EXAM:  VITAL SIGNS: ED Triage Vitals  Enc Vitals Group     BP 10/28/18 1223 (!) 143/104     Pulse Rate 10/28/18 1223 98     Resp 10/28/18 1223 18     Temp 10/28/18 1223 98.1 F (36.7 C)     Temp Source 10/28/18 1223 Oral     SpO2 10/28/18 1223 97 %     Weight 10/28/18 1223 227 lb (103 kg)     Height 10/28/18 1223 5\' 7"  (1.702 m)     Head Circumference --      Peak Flow --      Pain Score 10/28/18 1227 9     Pain Loc --      Pain Edu?  --      Excl. in GC? --    Constitutional: Alert and oriented. Well appearing and in no acute distress. Eyes: Conjunctivae are normal.  Head: Atraumatic. Nose: Mild congestion/rhinnorhea. Mouth/Throat: Mucous membranes are moist.  Oropharynx with erythema. No exudate.  Neck: No stridor.  Cardiovascular: Normal rate, regular rhythm. Good peripheral circulation. Grossly normal heart sounds.   Respiratory: Normal respiratory effort.  No retractions. Lungs CTAB. Gastrointestinal: Soft and nontender. No distention.  Musculoskeletal: No lower extremity tenderness nor edema. No gross deformities of extremities. Neurologic:  Normal speech and language. No gross focal neurologic deficits are appreciated.  Skin:  Skin is warm, dry and intact. No rash noted.  ____________________________________________   LABS (all labs ordered are listed, but only abnormal results are displayed)  Labs Reviewed  GROUP A STREP BY PCR   ____________________________________________  RADIOLOGY  Dg Chest 2 View  Result Date: 10/28/2018 CLINICAL DATA:  Cough and fever EXAM: CHEST - 2 VIEW COMPARISON:  June 16, 2018 FINDINGS: There is no edema or consolidation. The heart size and pulmonary vascularity are normal. No adenopathy. No bone lesions. There is postoperative change in the lower cervical region. IMPRESSION: No edema or consolidation. Electronically Signed   By: Bretta BangWilliam  Woodruff III M.D.   On: 10/28/2018 13:09    ____________________________________________   PROCEDURES  Procedure(s) performed:   Procedures  None  ____________________________________________   INITIAL IMPRESSION / ASSESSMENT AND PLAN / ED COURSE  Pertinent labs & imaging results that were available during my care of the patient were reviewed by me and considered in my medical decision making (see chart for details).  Patient presents to the ED for evaluation of flu like symptoms for the last weeks. Notes similar presentation  to last time he had strep. Doubt strep but sent PCR which is negative. CXR without infiltrate. Patient outside the window to benefit from Tamiflu. Possible bronchitis symptoms. With sore throat and bronchitis symptoms patient given Decadron, albuterol, and other meds for symptoms mgmt at home. Discussed f/u plan with PCP and ED return precautions.    ____________________________________________  FINAL CLINICAL IMPRESSION(S) / ED DIAGNOSES  Final diagnoses:  Viral URI with cough     MEDICATIONS GIVEN DURING THIS VISIT:  Medications  dexamethasone (DECADRON) injection 10 mg (10 mg Intramuscular Given 10/28/18 1428)     NEW OUTPATIENT MEDICATIONS STARTED DURING THIS VISIT:  Discharge Medication List as of 10/28/2018  2:31 PM    START taking these medications   Details  albuterol (PROVENTIL HFA;VENTOLIN HFA) 108 (90 Base) MCG/ACT inhaler Inhale 1-2 puffs into the lungs every 6 (six) hours as needed for wheezing or shortness of breath., Starting Fri 10/28/2018, Print    fluticasone (FLONASE) 50 MCG/ACT  nasal spray Place 2 sprays into both nostrils daily for 7 days., Starting Fri 10/28/2018, Until Fri 11/04/2018, Normal        Note:  This document was prepared using Dragon voice recognition software and may include unintentional dictation errors.  Alona Bene, MD Emergency Medicine    Kingston Guiles, Arlyss Repress, MD 10/28/18 1900

## 2018-10-28 NOTE — ED Triage Notes (Signed)
C/o flu like sx x 1 week-NAD-steady gait 

## 2018-10-28 NOTE — Discharge Instructions (Signed)

## 2018-11-06 ENCOUNTER — Emergency Department (HOSPITAL_BASED_OUTPATIENT_CLINIC_OR_DEPARTMENT_OTHER)
Admission: EM | Admit: 2018-11-06 | Discharge: 2018-11-06 | Disposition: A | Discharge status: Home / Self Care | Payer: Medicare Other | Attending: Emergency Medicine | Admitting: Emergency Medicine

## 2018-11-06 ENCOUNTER — Encounter (HOSPITAL_BASED_OUTPATIENT_CLINIC_OR_DEPARTMENT_OTHER): Payer: Self-pay | Admitting: *Deleted

## 2018-11-06 ENCOUNTER — Other Ambulatory Visit: Payer: Self-pay

## 2018-11-06 DIAGNOSIS — Z79899 Other long term (current) drug therapy: Secondary | ICD-10-CM | POA: Insufficient documentation

## 2018-11-06 DIAGNOSIS — I129 Hypertensive chronic kidney disease with stage 1 through stage 4 chronic kidney disease, or unspecified chronic kidney disease: Secondary | ICD-10-CM | POA: Insufficient documentation

## 2018-11-06 DIAGNOSIS — Z87891 Personal history of nicotine dependence: Secondary | ICD-10-CM | POA: Insufficient documentation

## 2018-11-06 DIAGNOSIS — J45909 Unspecified asthma, uncomplicated: Secondary | ICD-10-CM | POA: Diagnosis not present

## 2018-11-06 DIAGNOSIS — N182 Chronic kidney disease, stage 2 (mild): Secondary | ICD-10-CM | POA: Insufficient documentation

## 2018-11-06 DIAGNOSIS — Z794 Long term (current) use of insulin: Secondary | ICD-10-CM | POA: Insufficient documentation

## 2018-11-06 DIAGNOSIS — E1122 Type 2 diabetes mellitus with diabetic chronic kidney disease: Secondary | ICD-10-CM | POA: Diagnosis not present

## 2018-11-06 DIAGNOSIS — Z8673 Personal history of transient ischemic attack (TIA), and cerebral infarction without residual deficits: Secondary | ICD-10-CM | POA: Diagnosis not present

## 2018-11-06 DIAGNOSIS — Z9049 Acquired absence of other specified parts of digestive tract: Secondary | ICD-10-CM | POA: Insufficient documentation

## 2018-11-06 DIAGNOSIS — J029 Acute pharyngitis, unspecified: Secondary | ICD-10-CM | POA: Diagnosis not present

## 2018-11-06 DIAGNOSIS — F319 Bipolar disorder, unspecified: Secondary | ICD-10-CM | POA: Insufficient documentation

## 2018-11-06 LAB — GROUP A STREP BY PCR: Group A Strep by PCR: NOT DETECTED

## 2018-11-06 MED ORDER — ACETAMINOPHEN 500 MG PO TABS: 500.0000 mg | ORAL_TABLET | Freq: Four times a day (QID) | ORAL | 0 refills | Status: DC | PRN

## 2018-11-06 MED ORDER — HYDROCODONE-ACETAMINOPHEN 7.5-325 MG/15ML PO SOLN
10.0000 mL | Freq: Once | ORAL | Status: AC
  Administered 2018-11-06: 10 mL via ORAL
  Filled 2018-11-06: qty 15

## 2018-11-06 MED ORDER — HYDROCODONE-ACETAMINOPHEN 7.5-325 MG/15ML PO SOLN: 10.0000 mL | Freq: Four times a day (QID) | ORAL | 0 refills | Status: DC | PRN

## 2018-11-06 NOTE — ED Notes (Signed)
ED Provider at bedside. 

## 2018-11-06 NOTE — ED Triage Notes (Signed)
C/o sore throat x 3-4 days 

## 2018-11-06 NOTE — Discharge Instructions (Addendum)
You were seen today for sore throat.  Your strep screen is negative.  Gargle warm salt water.  Because of your allergies, you are limited to Tylenol for pain and fever control.  Hopefully you will begin to feel better in the next 1 to 2 days.  Follow-up with your primary doctor if not improving.

## 2018-11-06 NOTE — ED Provider Notes (Signed)
[facility] EMERGENCY DEPARTMENT Provider Note   CSN: [id number] Arrival date & time: 11/06/18  2230     History   Chief Complaint Chief Complaint  Patient presents with  . Sore Throat    HPI INRI Nicholas [name] is a 57 y.o. male.  HPI  This 57 year old male with a history of bipolar disorder, asthma, hypertension, hypothyroidism who presents with sore throat.  Patient reports 2 to 3-day history of worsening sore throat.  He describes it as "razor blades in my throat".  He states that it feels like when he had strep throat.  He rates his pain at 10 out of 10.  He did use some lidocaine mouthwash which seemed to help temporarily.  He reports a nonproductive cough.  States he had a fever to 102 days ago.  Denies any ear pain, congestion.  No myalgias.  He reports pain is worse with swallowing.  He is able to swallow but just reports increasing pain.  Of note, patient was seen 1 week ago for flulike symptoms.  At that time had a negative chest x-ray and strep screen.  Was treated with Flonase and Decadron for flulike symptoms.  Past Medical History:  Diagnosis Date  . Asthma   . Bipolar 1 disorder (HCC)   . Borderline glaucoma   . Chronic pain   . Epididymal pain    LEFT  . 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 Feeling of incomplete bladder emptying   . Frequency of urination   . Gastric ulcer   . GERD (gastroesophageal reflux disease)   . Hypertension   . Hyperthyroidism    NO MEDS   . Migraine   . Seizures (HCC)   . TIA (transient ischemic attack)   . Type 2 diabetes mellitus [facility])     Patient Active Problem List   Diagnosis Date Noted  . Essential hypertension   . Seizure disorder (HCC) 07/02/2017  . Insulin-requiring or dependent type II diabetes mellitus (HCC) 07/02/2017  . CKD (chronic kidney disease), stage II 07/02/2017  . Normocytic anemia 07/02/2017  .  Testicular/scrotal pain 11/09/2013  . Microhematuria 11/09/2013  . Condyloma acuminatum of scrotum 11/09/2013    Past Surgical History:  Procedure Laterality Date  . ANTERIOR CERVICAL DECOMP/DISCECTOMY FUSION  2007   C4  --  C6  . CERVICAL FUSION    . CHOLECYSTECTOMY    . CYSTOSCOPY N/A 11/09/2013   Procedure: CYSTOSCOPY FLEXIBLE;  Surgeon: Bjorn Pippin, MD;  Location: [facility];  Service: Urology;  Laterality: N/A;  . EPIDIDYMECTOMY Left 11/09/2013   Procedure:  LEFT EPIDIDYMECTOMY;  Surgeon: Bjorn Pippin, MD;  Location: [facility];  Service: Urology;  Laterality: Left;  POSSIBLE OUTPATIENT WITH OBSERVATION  . EXCISION LIPOMA LEFT SHOULDER  2004 (APPROX)  . MULTIPLE CYST REMOVED FROM CHEST  AGE 28  . OTHER SURGICAL HISTORY     hemorroid surgery   . TESTICLE REMOVAL Left   . TONSILLECTOMY          Home Medications    Prior to Admission medications   Medication Sig Start Date End Date Taking? Authorizing Provider  albuterol (PROVENTIL HFA;VENTOLIN HFA) 108 (90 Base) MCG/ACT inhaler Inhale 1-2 puffs into the lungs every 6 (six) hours as needed for wheezing or shortness of breath. 10/28/18  Yes Long, Arlyss Repress, MD  amitriptyline (ELAVIL) 25  MG tablet Take 25 mg by mouth at bedtime.   Yes [provider]  atorvastatin (LIPITOR) 80 MG tablet Take 80 mg by mouth daily.   Yes [provider]  carisoprodol (SOMA) 350 MG tablet Take 1 tablet (350 mg total) 3 (three) times daily as needed by mouth for muscle spasms. 10/13/17  Yes Loren Racer, MD  fluticasone-salmeterol (ADVAIR HFA) [phone] MCG/ACT inhaler Inhale 2 puffs into the lungs daily.   Yes [provider]  gabapentin (NEURONTIN) 300 MG capsule Take 300 mg by mouth 2 (two) times daily.    Yes [provider]  insulin regular (NOVOLIN R,HUMULIN R) 100 units/mL injection Inject 50 Units into the skin 4 (four) times daily -  before meals and at bedtime.   Yes [provider]  levETIRAcetam (KEPPRA) 250 MG tablet Take 1 tablet (250 mg total) by mouth 2 (two) times daily. 07/29/18  Yes Charlynne Pander, MD  lidocaine (LIDODERM) 5 % Place 1 patch onto the skin daily. Remove & Discard patch within 12 hours or as directed by MD 05/16/18  Yes Petrucelli, Samantha R, PA-C  metFORMIN (GLUCOPHAGE) 500 MG tablet Take 1,000 mg by mouth 2 (two) times daily with a meal.    Yes [provider]  nortriptyline (PAMELOR) 50 MG capsule Take 50 mg at bedtime by mouth.   Yes [provider]  rizatriptan (MAXALT) 10 MG tablet Take 10 mg by mouth as needed for migraine. May repeat in 2 hours if needed   Yes [provider]  sucralfate (CARAFATE) 1 GM/10ML suspension Take 10 mLs (1 g total) by mouth 4 (four) times daily -  with meals and at bedtime. 06/09/17  Yes Palumbo, April, MD  VALSARTAN PO Take 325 mg by mouth daily.   Yes [provider]  acetaminophen (TYLENOL) 500 MG tablet Take 1 tablet (500 mg total) by mouth every 6 (six) hours as needed. 11/06/18   Deyjah Kindel, Mayer Masker, MD  azithromycin (ZITHROMAX) 250 MG tablet Take 1 tablet (250 mg total) by mouth daily. 03/31/18   Melene Plan, DO  benzonatate (TESSALON) 100 MG capsule Take 1 capsule (100 mg total) by mouth every 8 (eight) hours. 10/28/18   Long, Arlyss Repress, MD  clindamycin (CLEOCIN) 300 MG capsule Take 1 capsule (300 mg total) by mouth 3 (three) times daily. 07/03/18   Raeford Razor, MD  clotrimazole (LOTRIMIN) 1 % cream Apply to affected area 2 times daily until it is resolved. 11/26/17   Alvira Monday, MD  fluticasone (FLONASE) 50 MCG/ACT nasal spray Place 2 sprays into both nostrils daily for 7 days. 10/28/18 11/04/18  Long, Arlyss Repress, MD  insulin glargine (LANTUS) 100 UNIT/ML injection Inject 45 Units into the skin at bedtime.     [provider]  insulin lispro (HUMALOG) 100 UNIT/ML injection Inject 6 Units into the skin 3 (three) times daily before meals. Sliding scale     [provider]  lidocaine (XYLOCAINE) 2 % solution Use as directed 15 mLs in the mouth or throat as needed for mouth pain. 03/26/18   Rancour, Jeannett Senior, MD  losartan (COZAAR) 50 MG tablet Take 50 mg by mouth daily.    [provider]  promethazine (PHENERGAN) 25 MG tablet Take 1 tablet (25 mg total) by mouth every 6 (six) hours as needed for nausea or vomiting. 05/04/17   Vanetta Mulders, MD  promethazine-dextromethorphan (PROMETHAZINE-DM) 6.25-15 MG/5ML syrup Take 5 mLs by mouth 4 (four) times daily as needed for cough. 08/06/17  Rolland Porter, MD    Family History No family history on file.  Social History Social History   Tobacco Use  . Smoking status: Former Smoker    Packs/day: 0.25    Years: 15.00    Pack years: 3.75    Types: Cigarettes    Last attempt to quit: 11/08/1992    Years since quitting: 26.0  . Smokeless tobacco: Never Used  Substance Use Topics  . Alcohol use: No  . Drug use: No     Allergies   Nsaids; Tramadol; Amoxicillin; Ampicillin; Dilantin [phenytoin]; Penicillins; Risperidone and related; Strawberry extract; Bactrim [sulfamethoxazole-trimethoprim]; Depakote [divalproex sodium]; Flexeril [cyclobenzaprine]; Ultram [tramadol hcl]; and Oatmeal   Review of Systems Review of Systems  Constitutional: Positive for fever. Negative for chills.  HENT: Positive for sore throat and trouble swallowing.   Respiratory: Negative for shortness of breath.   Cardiovascular: Negative for chest pain.  Gastrointestinal: Negative for nausea and vomiting.  Skin: Negative for rash.  All other systems reviewed and are negative.    Physical Exam Updated Vital Signs BP 129/86 (BP Location: Left Arm)   Pulse (!) 105   Temp 98.7 F (37.1 C) (Oral)   Resp 16   Ht 1.702 m (5\' 7" )   Wt 103 kg   SpO2 95%   BMI 35.56 kg/m   Physical Exam  Constitutional: He is oriented to person, place, and time. He appears well-developed and well-nourished.  Overweight    HENT:  Head: Normocephalic and atraumatic.  Erythema posterior oropharynx, status post tonsillectomy, no exudate noted, no asymmetric swelling  Eyes: Pupils are equal, round, and reactive to light.  Neck: Neck supple.  Cardiovascular: Normal rate, regular rhythm and normal heart sounds.  No murmur heard. Pulmonary/Chest: Effort normal and breath sounds normal. No respiratory distress. He has no wheezes.  Abdominal: Soft. Bowel sounds are normal. There is no tenderness. There is no rebound.  Musculoskeletal: He exhibits no edema.  Lymphadenopathy:    He has no cervical adenopathy.  Neurological: He is alert and oriented to person, place, and time.  Skin: Skin is warm and dry.  Psychiatric: He has a normal mood and affect.  Nursing note and vitals reviewed.    ED Treatments / Results  Labs (all labs ordered are listed, but only abnormal results are displayed) Labs Reviewed  GROUP A STREP BY PCR    EKG None  Radiology No results found.  Procedures Procedures (including critical care time)  Medications Ordered in ED Medications  HYDROcodone-acetaminophen (HYCET) 7.5-325 mg/15 ml solution 10 mL (10 mLs Oral Given 11/06/18 2322)     Initial Impression / Assessment and Plan / ED Course  I have reviewed the triage vital signs and the nursing notes.  Pertinent labs & imaging results that were available during my care of the patient were reviewed by me and considered in my medical decision making (see chart for details).     Patient presents with sore throat.  Reports cough.  Reports low-grade temperature.  Was also seen last week for flulike symptoms.  He is overall nontoxic-appearing.  Vital signs are largely reassuring.  Mild tachycardia to 105.  He is nontoxic on exam.  He status post tonsillectomy.  Strep PCR remains negative.  Suspect viral etiology.  I discussed this with the patient.  Initially because of allergies, was going to offer the patient Hycet, however, noted  in chart they had a history of drug-seeking behavior.  We discussed further supportive measures including warm salt water  gargling and Tylenol.  Continue Flonase.  After history, exam, and medical workup I feel the patient has been appropriately medically screened and is safe for discharge home. Pertinent diagnoses were discussed with the patient. Patient was given return precautions.   Final Clinical Impressions(s) / ED Diagnoses   Final diagnoses:  Viral pharyngitis    ED Discharge Orders         Ordered    HYDROcodone-acetaminophen (HYCET) 7.5-325 mg/15 ml solution  4 times daily PRN,   Status:  Discontinued     11/06/18 2335    acetaminophen (TYLENOL) 500 MG tablet  Every 6 hours PRN,   Status:  Discontinued     11/06/18 2337    acetaminophen (TYLENOL) 500 MG tablet  Every 6 hours PRN     11/06/18 2337           Shon BatonHorton, Myria Steenbergen F, MD 11/06/18 2350

## 2018-11-14 ENCOUNTER — Encounter (HOSPITAL_BASED_OUTPATIENT_CLINIC_OR_DEPARTMENT_OTHER): Payer: Self-pay

## 2018-11-14 ENCOUNTER — Other Ambulatory Visit: Payer: Self-pay

## 2018-11-14 ENCOUNTER — Emergency Department (HOSPITAL_BASED_OUTPATIENT_CLINIC_OR_DEPARTMENT_OTHER): Payer: Medicare Other

## 2018-11-14 DIAGNOSIS — N182 Chronic kidney disease, stage 2 (mild): Secondary | ICD-10-CM | POA: Diagnosis not present

## 2018-11-14 DIAGNOSIS — Z79899 Other long term (current) drug therapy: Secondary | ICD-10-CM | POA: Diagnosis not present

## 2018-11-14 DIAGNOSIS — J069 Acute upper respiratory infection, unspecified: Secondary | ICD-10-CM | POA: Diagnosis not present

## 2018-11-14 DIAGNOSIS — Z9049 Acquired absence of other specified parts of digestive tract: Secondary | ICD-10-CM | POA: Insufficient documentation

## 2018-11-14 DIAGNOSIS — J45909 Unspecified asthma, uncomplicated: Secondary | ICD-10-CM | POA: Insufficient documentation

## 2018-11-14 DIAGNOSIS — Z8673 Personal history of transient ischemic attack (TIA), and cerebral infarction without residual deficits: Secondary | ICD-10-CM | POA: Diagnosis not present

## 2018-11-14 DIAGNOSIS — R0602 Shortness of breath: Secondary | ICD-10-CM | POA: Insufficient documentation

## 2018-11-14 DIAGNOSIS — I129 Hypertensive chronic kidney disease with stage 1 through stage 4 chronic kidney disease, or unspecified chronic kidney disease: Secondary | ICD-10-CM | POA: Insufficient documentation

## 2018-11-14 DIAGNOSIS — Z87891 Personal history of nicotine dependence: Secondary | ICD-10-CM | POA: Diagnosis not present

## 2018-11-14 DIAGNOSIS — F319 Bipolar disorder, unspecified: Secondary | ICD-10-CM | POA: Diagnosis not present

## 2018-11-14 DIAGNOSIS — E119 Type 2 diabetes mellitus without complications: Secondary | ICD-10-CM | POA: Diagnosis not present

## 2018-11-14 DIAGNOSIS — Z794 Long term (current) use of insulin: Secondary | ICD-10-CM | POA: Insufficient documentation

## 2018-11-14 DIAGNOSIS — R05 Cough: Secondary | ICD-10-CM | POA: Diagnosis present

## 2018-11-14 MED ORDER — ALBUTEROL SULFATE (2.5 MG/3ML) 0.083% IN NEBU
5.0000 mg | INHALATION_SOLUTION | Freq: Once | RESPIRATORY_TRACT | Status: AC
  Administered 2018-11-15: 5 mg via RESPIRATORY_TRACT
  Filled 2018-11-14: qty 6

## 2018-11-14 NOTE — ED Triage Notes (Addendum)
Pt c/o coughing up green sputum, sore throat,post tussive vomiting, SOB x 5 days with chest tightness. Currently taking Robitussin without relief.

## 2018-11-15 ENCOUNTER — Emergency Department (HOSPITAL_BASED_OUTPATIENT_CLINIC_OR_DEPARTMENT_OTHER)
Admission: EM | Admit: 2018-11-15 | Discharge: 2018-11-15 | Disposition: A | Discharge status: Home / Self Care | Payer: Medicare Other | Attending: Emergency Medicine | Admitting: Emergency Medicine

## 2018-11-15 DIAGNOSIS — B9789 Other viral agents as the cause of diseases classified elsewhere: Secondary | ICD-10-CM

## 2018-11-15 DIAGNOSIS — J069 Acute upper respiratory infection, unspecified: Secondary | ICD-10-CM | POA: Diagnosis not present

## 2018-11-15 LAB — CBC WITH DIFFERENTIAL/PLATELET
Abs Immature Granulocytes: 0.02 10*3/uL (ref 0.00–0.07)
Basophils Absolute: 0 10*3/uL (ref 0.0–0.1)
Basophils Relative: 1 %
Eosinophils Absolute: 0.1 10*3/uL (ref 0.0–0.5)
Eosinophils Relative: 1 %
HCT: 45.6 % (ref 39.0–52.0)
Hemoglobin: 14.3 g/dL (ref 13.0–17.0)
Immature Granulocytes: 0 %
Lymphocytes Relative: 29 %
Lymphs Abs: 1.7 10*3/uL (ref 0.7–4.0)
MCH: 27.1 pg (ref 26.0–34.0)
MCHC: 31.4 g/dL (ref 30.0–36.0)
MCV: 86.5 fL (ref 80.0–100.0)
Monocytes Absolute: 0.4 10*3/uL (ref 0.1–1.0)
Monocytes Relative: 8 %
Neutro Abs: 3.6 10*3/uL (ref 1.7–7.7)
Neutrophils Relative %: 61 %
Platelets: 165 10*3/uL (ref 150–400)
RBC: 5.27 MIL/uL (ref 4.22–5.81)
RDW: 15.1 % (ref 11.5–15.5)
WBC: 5.8 10*3/uL (ref 4.0–10.5)
nRBC: 0 % (ref 0.0–0.2)

## 2018-11-15 LAB — COMPREHENSIVE METABOLIC PANEL
ALT: 57 U/L — ABNORMAL HIGH (ref 0–44)
AST: 31 U/L (ref 15–41)
Albumin: 4.1 g/dL (ref 3.5–5.0)
Alkaline Phosphatase: 57 U/L (ref 38–126)
Anion gap: 8 (ref 5–15)
BUN: 20 mg/dL (ref 6–20)
CO2: 24 mmol/L (ref 22–32)
Calcium: 9.6 mg/dL (ref 8.9–10.3)
Chloride: 108 mmol/L (ref 98–111)
Creatinine, Ser: 1.36 mg/dL — ABNORMAL HIGH (ref 0.61–1.24)
GFR calc Af Amer: >60 mL/min (ref >60)
GFR calc non Af Amer: 58 mL/min — ABNORMAL LOW (ref >60)
Glucose, Bld: 285 mg/dL — ABNORMAL HIGH (ref 70–99)
Potassium: 3.4 mmol/L — ABNORMAL LOW (ref 3.5–5.1)
Sodium: 140 mmol/L (ref 135–145)
Total Bilirubin: 0.5 mg/dL (ref 0.3–1.2)
Total Protein: 7 g/dL (ref 6.5–8.1)

## 2018-11-15 LAB — D-DIMER, QUANTITATIVE: D-Dimer, Quant: <0.27 ug/mL-FEU (ref 0.00–0.50)

## 2018-11-15 LAB — TROPONIN I: Troponin I: <0.03 ng/mL (ref <0.03)

## 2018-11-15 LAB — BRAIN NATRIURETIC PEPTIDE: B Natriuretic Peptide: 6.9 pg/mL (ref 0.0–100.0)

## 2018-11-15 MED ORDER — IPRATROPIUM-ALBUTEROL 0.5-2.5 (3) MG/3ML IN SOLN
3.0000 mL | Freq: Four times a day (QID) | RESPIRATORY_TRACT | Status: DC
  Administered 2018-11-15: 3 mL via RESPIRATORY_TRACT
  Filled 2018-11-15: qty 3

## 2018-11-15 MED ORDER — ALBUTEROL SULFATE HFA 108 (90 BASE) MCG/ACT IN AERS: 1.0000 | INHALATION_SPRAY | Freq: Four times a day (QID) | RESPIRATORY_TRACT | 0 refills | Status: AC | PRN

## 2018-11-15 MED ORDER — DOXYCYCLINE HYCLATE 100 MG PO CAPS: 100.0000 mg | ORAL_CAPSULE | Freq: Two times a day (BID) | ORAL | 0 refills | Status: DC

## 2018-11-15 MED ORDER — SODIUM CHLORIDE 0.9 % IV BOLUS
1000.0000 mL | Freq: Once | INTRAVENOUS | Status: AC
  Administered 2018-11-15: 1000 mL via INTRAVENOUS

## 2018-11-15 NOTE — ED Provider Notes (Signed)
[facility] EMERGENCY DEPARTMENT Provider Note   CSN: [id number] Arrival date & time: 11/14/18  2242     History   Chief Complaint Chief Complaint  Patient presents with  . Cough    HPI Nicholas [name] is a 57 y.o. male.  Patient with history of asthma, diabetes, hypertension, seizures presenting with a 5-day history of cough productive of clear and yellow mucus, posttussive emesis, sore throat and chest tightness with coughing.  Also complains of gradual onset diffuse headache.  Patient with 2 recent visits for sore throat which he states is improved but now complaining of tightness in his chest with difficulty breathing and coughing up green sputum and posttussive emesis.  He is been using his inhalers at home without relief.  Reports fever to 100.  Denies any emesis other than with coughing.  Taking Robitussin at home without relief.  Has had sick contacts at home.  Denies any previous history of CAD.  The history is provided by the patient.  Cough  Associated symptoms include headaches, rhinorrhea, myalgias and shortness of breath.    Past Medical History:  Diagnosis Date  . Asthma   . Bipolar 1 disorder (HCC)   . Borderline glaucoma   . Chronic pain   . Epididymal pain    LEFT  . 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 Feeling of incomplete bladder emptying   . Frequency of urination   . Gastric ulcer   . GERD (gastroesophageal reflux disease)   . Hypertension   . Hyperthyroidism    NO MEDS   . Migraine   . Seizures (HCC)   . TIA (transient ischemic attack)   . Type 2 diabetes mellitus [facility])     Patient Active Problem List   Diagnosis Date Noted  . Essential hypertension   . Seizure disorder (HCC) 07/02/2017  . Insulin-requiring or dependent type II diabetes mellitus (HCC) 07/02/2017  . CKD (chronic kidney disease), stage II 07/02/2017  . Normocytic anemia  07/02/2017  . Testicular/scrotal pain 11/09/2013  . Microhematuria 11/09/2013  . Condyloma acuminatum of scrotum 11/09/2013    Past Surgical History:  Procedure Laterality Date  . ANTERIOR CERVICAL DECOMP/DISCECTOMY FUSION  2007   C4  --  C6  . CERVICAL FUSION    . CHOLECYSTECTOMY    . CYSTOSCOPY N/A 11/09/2013   Procedure: CYSTOSCOPY FLEXIBLE;  Surgeon: Bjorn Pippin, MD;  Location: [facility];  Service: Urology;  Laterality: N/A;  . EPIDIDYMECTOMY Left 11/09/2013   Procedure:  LEFT EPIDIDYMECTOMY;  Surgeon: Bjorn Pippin, MD;  Location: [facility];  Service: Urology;  Laterality: Left;  POSSIBLE OUTPATIENT WITH OBSERVATION  . EXCISION LIPOMA LEFT SHOULDER  2004 (APPROX)  . MULTIPLE CYST REMOVED FROM CHEST  AGE 10  . OTHER SURGICAL HISTORY     hemorroid surgery   . TESTICLE REMOVAL Left   . TONSILLECTOMY          Home Medications    Prior to Admission medications   Medication Sig Start Date End Date Taking? Authorizing Provider  acetaminophen (TYLENOL) 500 MG tablet Take 1 tablet (500 mg total) by mouth every 6 (six) hours as needed. 11/06/18   Horton, Mayer Masker, MD  albuterol (PROVENTIL HFA;VENTOLIN HFA) 108 (90 Base) MCG/ACT inhaler Inhale 1-2 puffs into the lungs every 6 (six) hours as needed for wheezing or shortness  of breath. 10/28/18   Long, Arlyss RepressJoshua G, MD  amitriptyline (ELAVIL) 25 MG tablet Take 25 mg by mouth at bedtime.    [provider]  atorvastatin (LIPITOR) 80 MG tablet Take 80 mg by mouth daily.    [provider]  azithromycin (ZITHROMAX) 250 MG tablet Take 1 tablet (250 mg total) by mouth daily. 03/31/18   Melene PlanFloyd, Dan, DO  benzonatate (TESSALON) 100 MG capsule Take 1 capsule (100 mg total) by mouth every 8 (eight) hours. 10/28/18   Long, Arlyss RepressJoshua G, MD  carisoprodol (SOMA) 350 MG tablet Take 1 tablet (350 mg total) 3 (three) times daily as needed by mouth for muscle spasms. 10/13/17   Loren RacerYelverton, David, MD  clindamycin  (CLEOCIN) 300 MG capsule Take 1 capsule (300 mg total) by mouth 3 (three) times daily. 07/03/18   Raeford RazorKohut, Hewitt Garner, MD  clotrimazole (LOTRIMIN) 1 % cream Apply to affected area 2 times daily until it is resolved. 11/26/17   Alvira MondaySchlossman, Erin, MD  fluticasone (FLONASE) 50 MCG/ACT nasal spray Place 2 sprays into both nostrils daily for 7 days. 10/28/18 11/04/18  Maia PlanLong, Joshua G, MD  fluticasone-salmeterol (ADVAIR HFA) [id number] MCG/ACT inhaler Inhale 2 puffs into the lungs daily.    [provider]  gabapentin (NEURONTIN) 300 MG capsule Take 300 mg by mouth 2 (two) times daily.     [provider]  insulin glargine (LANTUS) 100 UNIT/ML injection Inject 45 Units into the skin at bedtime.     [provider]  insulin lispro (HUMALOG) 100 UNIT/ML injection Inject 6 Units into the skin 3 (three) times daily before meals. Sliding scale    [provider]  insulin regular (NOVOLIN R,HUMULIN R) 100 units/mL injection Inject 50 Units into the skin 4 (four) times daily -  before meals and at bedtime.    [provider]  levETIRAcetam (KEPPRA) 250 MG tablet Take 1 tablet (250 mg total) by mouth 2 (two) times daily. 07/29/18   Charlynne PanderYao, David Hsienta, MD  lidocaine (LIDODERM) 5 % Place 1 patch onto the skin daily. Remove & Discard patch within 12 hours or as directed by MD 05/16/18   Petrucelli, Lelon MastSamantha R, PA-C  lidocaine (XYLOCAINE) 2 % solution Use as directed 15 mLs in the mouth or throat as needed for mouth pain. 03/26/18   Janai Brannigan, Jeannett SeniorStephen, MD  losartan (COZAAR) 50 MG tablet Take 50 mg by mouth daily.    [provider]  metFORMIN (GLUCOPHAGE) 500 MG tablet Take 1,000 mg by mouth 2 (two) times daily with a meal.     [provider]  nortriptyline (PAMELOR) 50 MG capsule Take 50 mg at bedtime by mouth.    [provider]  promethazine (PHENERGAN) 25 MG tablet Take 1 tablet (25 mg total) by mouth every 6 (six) hours as needed for nausea or vomiting.  05/04/17   Vanetta MuldersZackowski, Scott, MD  promethazine-dextromethorphan (PROMETHAZINE-DM) 6.25-15 MG/5ML syrup Take 5 mLs by mouth 4 (four) times daily as needed for cough. 08/06/17   Rolland PorterJames, Mark, MD  rizatriptan (MAXALT) 10 MG tablet Take 10 mg by mouth as needed for migraine. May repeat in 2 hours if needed    [provider]  sucralfate (CARAFATE) 1 GM/10ML suspension Take 10 mLs (1 g total) by mouth 4 (four) times daily -  with meals and at bedtime. 06/09/17   Palumbo, April, MD  VALSARTAN PO Take 325 mg by mouth daily.    [provider]    Family History History reviewed. No pertinent  family history.  Social History Social History   Tobacco Use  . Smoking status: Former Smoker    Packs/day: 0.25    Years: 15.00    Pack years: 3.75    Types: Cigarettes    Last attempt to quit: 11/08/1992    Years since quitting: 26.0  . Smokeless tobacco: Never Used  Substance Use Topics  . Alcohol use: No  . Drug use: No     Allergies   Nsaids; Tramadol; Amoxicillin; Ampicillin; Dilantin [phenytoin]; Penicillins; Risperidone and related; Strawberry extract; Bactrim [sulfamethoxazole-trimethoprim]; Depakote [divalproex sodium]; Ultram [tramadol hcl]; and Oatmeal   Review of Systems Review of Systems  Constitutional: Negative for activity change and appetite change.  HENT: Positive for congestion and rhinorrhea.   Respiratory: Positive for cough, chest tightness and shortness of breath.   Gastrointestinal: Positive for nausea and vomiting. Negative for abdominal pain.  Genitourinary: Negative for dysuria and testicular pain.  Musculoskeletal: Positive for arthralgias and myalgias.  Neurological: Positive for weakness and headaches.   all other systems are negative except as noted in the HPI and PMH.     Physical Exam Updated Vital Signs BP (!) 137/92 (BP Location: Right Arm)   Pulse (!) 102   Temp 98.1 F (36.7 C) (Oral)   Resp 18   Ht 5\' 7"  (1.702 m)   Wt 103 kg   SpO2  94%   BMI 35.56 kg/m   Physical Exam  Constitutional: He is oriented to person, place, and time. He appears well-developed and well-nourished. No distress.  HENT:  Head: Normocephalic and atraumatic.  Mouth/Throat: Oropharynx is clear and moist. No oropharyngeal exudate.  Eyes: Pupils are equal, round, and reactive to light. Conjunctivae and EOM are normal.  Neck: Normal range of motion. Neck supple.  No meningismus.  Cardiovascular: Normal rate, regular rhythm, normal heart sounds and intact distal pulses. Exam reveals no gallop.  No murmur heard. Pulmonary/Chest: Effort normal and breath sounds normal. No respiratory distress. He has no wheezes. He exhibits tenderness.  No respiratory distress, moving air well, no wheezing  Abdominal: Soft. There is no tenderness. There is no rebound and no guarding.  Musculoskeletal: Normal range of motion. He exhibits no edema or tenderness.  Neurological: He is alert and oriented to person, place, and time. No cranial nerve deficit. He exhibits normal muscle tone. Coordination normal.   5/5 strength throughout. CN 2-12 intact.Equal grip strength.   Skin: Skin is warm. Capillary refill takes less than 2 seconds.  Psychiatric: He has a normal mood and affect. His behavior is normal.  Nursing note and vitals reviewed.    ED Treatments / Results  Labs (all labs ordered are listed, but only abnormal results are displayed) Labs Reviewed  COMPREHENSIVE METABOLIC PANEL - Abnormal; Notable for the following components:      Result Value   Potassium 3.4 (*)    Glucose, Bld 285 (*)    Creatinine, Ser 1.36 (*)    ALT 57 (*)    GFR calc non Af Amer 58 (*)    All other components within normal limits  CBC WITH DIFFERENTIAL/PLATELET  TROPONIN I  BRAIN NATRIURETIC PEPTIDE  D-DIMER, QUANTITATIVE (NOT AT [facility])    EKG EKG Interpretation  Date/Time:  Monday November 14 2018 23:09:11 EST Ventricular Rate:  108 PR Interval:  158 QRS Duration: 86 QT  Interval:  298 QTC Calculation: 399 R Axis:   -39 Text Interpretation:  Sinus tachycardia Left axis deviation Possible Anterior infarct , age undetermined Abnormal ECG  Rate faster Confirmed by Glynn Octave [phone]) on 11/15/2018 2:45:23 AM   Radiology Dg Chest 2 View  Result Date: 11/14/2018 CLINICAL DATA:  Cough and chest pain EXAM: CHEST - 2 VIEW COMPARISON:  10/28/2018, 06/16/2018 FINDINGS: The heart size and mediastinal contours are within normal limits. Both lungs are clear. Postsurgical changes in the lower cervical region. IMPRESSION: No active cardiopulmonary disease. Electronically Signed   By: Jasmine Pang M.D.   On: 11/14/2018 23:27    Procedures Procedures (including critical care time)  Medications Ordered in ED Medications  ipratropium-albuterol (DUONEB) 0.5-2.5 (3) MG/3ML nebulizer solution 3 mL (has no administration in time range)  sodium chloride 0.9 % bolus 1,000 mL (1,000 mLs Intravenous New Bag/Given 11/15/18 0238)  albuterol (PROVENTIL) (2.5 MG/3ML) 0.083% nebulizer solution 5 mg (5 mg Nebulization Given 11/15/18 0018)     Initial Impression / Assessment and Plan / ED Course  I have reviewed the triage vital signs and the nursing notes.  Pertinent labs & imaging results that were available during my care of the patient were reviewed by me and considered in my medical decision making (see chart for details).    5-day history of cough, chest pain with coughing, posttussive emesis.  No distress.  No hypoxia.  Lungs are clear and assessment after receiving nebulizer.  X-ray shows no pneumonia.  No wheezing on exam.  EKG is unchanged.  Patient with 5 days of constant chest tightness in setting of cough and posttussive emesis. Troponin and d-dimer negative. ACS effectively ruled out with 5 days of constant pain and negative troponin.   On recheck patient sleeping comfortably, lungs are clear.  No wheezing.  Will hold steroids at this time.  We will treat  empirically for bronchitis with antibiotics given his smoking history. Prefer to avoid steroids as he is not wheezing and does have history of diabetes. D/w patient smoking cessation.  Bronchodilators, antibiotics, antitussives.  followup with PCP. Return precautions discussed.   Final Clinical Impressions(s) / ED Diagnoses   Final diagnoses:  Viral URI with cough    ED Discharge Orders    None       Estie Sproule, Jeannett Senior, MD 11/15/18 [phone]

## 2018-11-15 NOTE — ED Notes (Addendum)
Pt c/o generalized body aches, headache and cough for over a week, subjective fever,  unrelieved by home medications

## 2018-11-15 NOTE — ED Notes (Signed)
ED Provider at bedside. 

## 2018-11-15 NOTE — ED Notes (Signed)
Patient verbalizes understanding of discharge instructions. Opportunity for questioning and answers were provided. Armband removed by staff, pt discharged from ED home via POV.  

## 2018-11-15 NOTE — Discharge Instructions (Addendum)
Take the antibiotics as prescribed and stop smoking.  Use the inhaler every 4 hours as needed.  Follow-up with your doctor.  Return to the ED with new or worsening symptoms.

## 2018-11-15 NOTE — ED Notes (Signed)
Pt reports he feels better but is still having a headache.

## 2018-11-25 ENCOUNTER — Other Ambulatory Visit: Payer: Self-pay

## 2018-11-25 ENCOUNTER — Encounter (HOSPITAL_BASED_OUTPATIENT_CLINIC_OR_DEPARTMENT_OTHER): Payer: Self-pay | Admitting: Emergency Medicine

## 2018-11-25 ENCOUNTER — Emergency Department (HOSPITAL_BASED_OUTPATIENT_CLINIC_OR_DEPARTMENT_OTHER): Payer: Medicare Other

## 2018-11-25 ENCOUNTER — Emergency Department (HOSPITAL_BASED_OUTPATIENT_CLINIC_OR_DEPARTMENT_OTHER)
Admission: EM | Admit: 2018-11-25 | Discharge: 2018-11-25 | Disposition: A | Discharge status: Home / Self Care | Payer: Medicare Other | Attending: Emergency Medicine | Admitting: Emergency Medicine

## 2018-11-25 DIAGNOSIS — Z87891 Personal history of nicotine dependence: Secondary | ICD-10-CM | POA: Diagnosis not present

## 2018-11-25 DIAGNOSIS — N182 Chronic kidney disease, stage 2 (mild): Secondary | ICD-10-CM | POA: Insufficient documentation

## 2018-11-25 DIAGNOSIS — R0602 Shortness of breath: Secondary | ICD-10-CM

## 2018-11-25 DIAGNOSIS — J40 Bronchitis, not specified as acute or chronic: Secondary | ICD-10-CM

## 2018-11-25 DIAGNOSIS — E119 Type 2 diabetes mellitus without complications: Secondary | ICD-10-CM | POA: Insufficient documentation

## 2018-11-25 DIAGNOSIS — I129 Hypertensive chronic kidney disease with stage 1 through stage 4 chronic kidney disease, or unspecified chronic kidney disease: Secondary | ICD-10-CM | POA: Diagnosis not present

## 2018-11-25 DIAGNOSIS — Z79899 Other long term (current) drug therapy: Secondary | ICD-10-CM | POA: Insufficient documentation

## 2018-11-25 DIAGNOSIS — Z794 Long term (current) use of insulin: Secondary | ICD-10-CM | POA: Diagnosis not present

## 2018-11-25 DIAGNOSIS — R05 Cough: Secondary | ICD-10-CM | POA: Diagnosis not present

## 2018-11-25 DIAGNOSIS — J45909 Unspecified asthma, uncomplicated: Secondary | ICD-10-CM | POA: Diagnosis not present

## 2018-11-25 DIAGNOSIS — R0981 Nasal congestion: Secondary | ICD-10-CM | POA: Diagnosis not present

## 2018-11-25 MED ORDER — ACETAMINOPHEN-CODEINE 120-12 MG/5ML PO SOLN: 10.0000 mL | ORAL | 0 refills | Status: DC | PRN

## 2018-11-25 MED ORDER — DEXAMETHASONE SODIUM PHOSPHATE 10 MG/ML IJ SOLN
10.0000 mg | Freq: Once | INTRAMUSCULAR | Status: AC
  Administered 2018-11-25: 10 mg via INTRAMUSCULAR
  Filled 2018-11-25: qty 1

## 2018-11-25 MED ORDER — LEVOFLOXACIN 750 MG PO TABS: 750.0000 mg | ORAL_TABLET | Freq: Every day | ORAL | 0 refills | Status: DC

## 2018-11-25 MED ORDER — ALBUTEROL SULFATE HFA 108 (90 BASE) MCG/ACT IN AERS
2.0000 | INHALATION_SPRAY | Freq: Four times a day (QID) | RESPIRATORY_TRACT | Status: DC
  Administered 2018-11-25: 2 via RESPIRATORY_TRACT
  Filled 2018-11-25: qty 6.7

## 2018-11-25 MED ORDER — AEROCHAMBER PLUS FLO-VU MEDIUM MISC
1.0000 | Freq: Once | Status: AC
  Administered 2018-11-25: 1
  Filled 2018-11-25: qty 1

## 2018-11-25 MED ORDER — GUAIFENESIN ER 1200 MG PO TB12: 1.0000 | ORAL_TABLET | Freq: Two times a day (BID) | ORAL | 0 refills | Status: DC

## 2018-11-25 MED ORDER — IPRATROPIUM-ALBUTEROL 0.5-2.5 (3) MG/3ML IN SOLN
3.0000 mL | Freq: Once | RESPIRATORY_TRACT | Status: AC
  Administered 2018-11-25: 3 mL via RESPIRATORY_TRACT
  Filled 2018-11-25: qty 3

## 2018-11-25 NOTE — Discharge Instructions (Signed)
Return here as needed.  Follow-up with your doctor soon as possible.  Your chest x-ray did not show any significant abnormalities at this time.  Increase your fluid intake and rest as much as possible.  Make sure you take the cough medicine at bedtime.

## 2018-11-25 NOTE — ED Notes (Signed)
ED Provider at bedside. 

## 2018-11-25 NOTE — ED Triage Notes (Addendum)
C/o shortness of breath and nonproductive cough.  Reports chest pain on inspiration.

## 2018-11-25 NOTE — ED Provider Notes (Signed)
[facility] EMERGENCY DEPARTMENT Provider Note   CSN: [id number] Arrival date & time: 11/25/18  1627     History   Chief Complaint Chief Complaint  Patient presents with  . Shortness of Breath    HPI Nicholas [name] is a 57 y.o. male.  HPI Patient presents to the emergency department with cough nasal congestion shortness of breath is been ongoing over the last 2 weeks.  The patient was seen here in the emergency department and had an evaluation and was given doxycycline for coverage of possible pneumonia.  Patient states that he seemed to get worse after this.  The patient was not placed on any other medications.  Patient states that nothing seems to make his condition better but deep breathing makes his pain worse.  Patient states he had nasal congestion and drainage but the productive cough.  The patient deniesheadache,blurred vision, neck pain, fever,  weakness, numbness, dizziness, anorexia, edema, abdominal pain, nausea, vomiting, diarrhea, rash, back pain, dysuria, hematemesis, bloody stool, near syncope, or syncope. Past Medical History:  Diagnosis Date  . Asthma   . Bipolar 1 disorder (HCC)   . Borderline glaucoma   . Chronic pain   . Epididymal pain    LEFT  . 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 Feeling of incomplete bladder emptying   . Frequency of urination   . Gastric ulcer   . GERD (gastroesophageal reflux disease)   . Hypertension   . Hyperthyroidism    NO MEDS   . Migraine   . Seizures (HCC)   . TIA (transient ischemic attack)   . Type 2 diabetes mellitus [facility])     Patient Active Problem List   Diagnosis Date Noted  . Essential hypertension   . Seizure disorder (HCC) 07/02/2017  . Insulin-requiring or dependent type II diabetes mellitus (HCC) 07/02/2017  . CKD (chronic kidney disease), stage II 07/02/2017  . Normocytic anemia 07/02/2017  .  Testicular/scrotal pain 11/09/2013  . Microhematuria 11/09/2013  . Condyloma acuminatum of scrotum 11/09/2013    Past Surgical History:  Procedure Laterality Date  . ANTERIOR CERVICAL DECOMP/DISCECTOMY FUSION  2007   C4  --  C6  . CERVICAL FUSION    . CHOLECYSTECTOMY    . CYSTOSCOPY N/A 11/09/2013   Procedure: CYSTOSCOPY FLEXIBLE;  Surgeon: Bjorn Pippin, MD;  Location: [facility];  Service: Urology;  Laterality: N/A;  . EPIDIDYMECTOMY Left 11/09/2013   Procedure:  LEFT EPIDIDYMECTOMY;  Surgeon: Bjorn Pippin, MD;  Location: [facility];  Service: Urology;  Laterality: Left;  POSSIBLE OUTPATIENT WITH OBSERVATION  . EXCISION LIPOMA LEFT SHOULDER  2004 (APPROX)  . MULTIPLE CYST REMOVED FROM CHEST  AGE 7  . OTHER SURGICAL HISTORY     hemorroid surgery   . TESTICLE REMOVAL Left   . TONSILLECTOMY          Home Medications    Prior to Admission medications   Medication Sig Start Date End Date Taking? Authorizing Provider  acetaminophen (TYLENOL) 500 MG tablet Take 1 tablet (500 mg total) by mouth every 6 (six) hours as needed. 11/06/18   Horton, Mayer Masker, MD  albuterol (PROVENTIL HFA;VENTOLIN HFA) 108 (90 Base) MCG/ACT inhaler Inhale 1-2 puffs into the lungs every 6 (six) hours as needed for wheezing or shortness of breath. 11/15/18   Glynn Octave, MD  amitriptyline Caleb Popp)  25 MG tablet Take 25 mg by mouth at bedtime.    [provider]  atorvastatin (LIPITOR) 80 MG tablet Take 80 mg by mouth daily.    [provider]  azithromycin (ZITHROMAX) 250 MG tablet Take 1 tablet (250 mg total) by mouth daily. 03/31/18   Melene Plan, DO  benzonatate (TESSALON) 100 MG capsule Take 1 capsule (100 mg total) by mouth every 8 (eight) hours. 10/28/18   Long, Arlyss Repress, MD  carisoprodol (SOMA) 350 MG tablet Take 1 tablet (350 mg total) 3 (three) times daily as needed by mouth for muscle spasms. 10/13/17   Loren Racer, MD  clindamycin (CLEOCIN) 300 MG  capsule Take 1 capsule (300 mg total) by mouth 3 (three) times daily. 07/03/18   Raeford Razor, MD  clotrimazole (LOTRIMIN) 1 % cream Apply to affected area 2 times daily until it is resolved. 11/26/17   Alvira Monday, MD  doxycycline (VIBRAMYCIN) 100 MG capsule Take 1 capsule (100 mg total) by mouth 2 (two) times daily. 11/15/18   Rancour, Jeannett Senior, MD  fluticasone (FLONASE) 50 MCG/ACT nasal spray Place 2 sprays into both nostrils daily for 7 days. 10/28/18 11/04/18  Maia Plan, MD  fluticasone-salmeterol (ADVAIR HFA) 161-09 MCG/ACT inhaler Inhale 2 puffs into the lungs daily.    [provider]  gabapentin (NEURONTIN) 300 MG capsule Take 300 mg by mouth 2 (two) times daily.     [provider]  insulin glargine (LANTUS) 100 UNIT/ML injection Inject 45 Units into the skin at bedtime.     [provider]  insulin lispro (HUMALOG) 100 UNIT/ML injection Inject 6 Units into the skin 3 (three) times daily before meals. Sliding scale    [provider]  insulin regular (NOVOLIN R,HUMULIN R) 100 units/mL injection Inject 50 Units into the skin 4 (four) times daily -  before meals and at bedtime.    [provider]  levETIRAcetam (KEPPRA) 250 MG tablet Take 1 tablet (250 mg total) by mouth 2 (two) times daily. 07/29/18   Charlynne Pander, MD  lidocaine (LIDODERM) 5 % Place 1 patch onto the skin daily. Remove & Discard patch within 12 hours or as directed by MD 05/16/18   Petrucelli, Lelon Mast R, PA-C  lidocaine (XYLOCAINE) 2 % solution Use as directed 15 mLs in the mouth or throat as needed for mouth pain. 03/26/18   Rancour, Jeannett Senior, MD  losartan (COZAAR) 50 MG tablet Take 50 mg by mouth daily.    [provider]  metFORMIN (GLUCOPHAGE) 500 MG tablet Take 1,000 mg by mouth 2 (two) times daily with a meal.     [provider]  nortriptyline (PAMELOR) 50 MG capsule Take 50 mg at bedtime by mouth.    [provider]  promethazine  (PHENERGAN) 25 MG tablet Take 1 tablet (25 mg total) by mouth every 6 (six) hours as needed for nausea or vomiting. 05/04/17   Vanetta Mulders, MD  promethazine-dextromethorphan (PROMETHAZINE-DM) 6.25-15 MG/5ML syrup Take 5 mLs by mouth 4 (four) times daily as needed for cough. 08/06/17   Rolland Porter, MD  rizatriptan (MAXALT) 10 MG tablet Take 10 mg by mouth as needed for migraine. May repeat in 2 hours if needed    [provider]  sucralfate (CARAFATE) 1 GM/10ML suspension Take 10 mLs (1 g total) by mouth 4 (four) times daily -  with meals and at bedtime. 06/09/17   Palumbo, April, MD  VALSARTAN PO Take 325 mg by mouth daily.  [provider]    Family History History reviewed. No pertinent family history.  Social History Social History   Tobacco Use  . Smoking status: Former Smoker    Packs/day: 0.25    Years: 15.00    Pack years: 3.75    Types: Cigarettes    Last attempt to quit: 11/08/1992    Years since quitting: 26.0  . Smokeless tobacco: Never Used  Substance Use Topics  . Alcohol use: No  . Drug use: No     Allergies   Nsaids; Tramadol; Amoxicillin; Ampicillin; Dilantin [phenytoin]; Penicillins; Risperidone and related; Strawberry extract; Bactrim [sulfamethoxazole-trimethoprim]; Depakote [divalproex sodium]; Ultram [tramadol hcl]; and Oatmeal   Review of Systems Review of Systems All other systems negative except as documented in the HPI. All pertinent positives and negatives as reviewed in the HPI. Physical Exam Updated Vital Signs BP (!) 156/107 (BP Location: Right Arm)   Pulse 90   Temp 98.2 F (36.8 C) (Oral)   Resp 20   Ht 5\' 7"  (1.702 m)   Wt 102.5 kg   SpO2 98%   BMI 35.40 kg/m   Physical Exam Vitals signs and nursing note reviewed.  Constitutional:      General: He is not in acute distress.    Appearance: He is well-developed.  HENT:     Head: Normocephalic and atraumatic.  Eyes:     Pupils: Pupils are equal, round, and  reactive to light.  Neck:     Musculoskeletal: Normal range of motion and neck supple.  Cardiovascular:     Rate and Rhythm: Normal rate and regular rhythm.     Heart sounds: Normal heart sounds. No murmur. No friction rub. No gallop.   Pulmonary:     Effort: Pulmonary effort is normal. No respiratory distress.     Breath sounds: Examination of the right-upper field reveals decreased breath sounds. Examination of the left-upper field reveals decreased breath sounds. Examination of the right-middle field reveals decreased breath sounds. Examination of the left-middle field reveals decreased breath sounds. Examination of the right-lower field reveals decreased breath sounds. Examination of the left-lower field reveals decreased breath sounds. Decreased breath sounds present. No wheezing or rhonchi.  Abdominal:     General: Bowel sounds are normal. There is no distension.     Palpations: Abdomen is soft.     Tenderness: There is no abdominal tenderness.  Musculoskeletal:     Right lower leg: No edema.     Left lower leg: No edema.  Skin:    General: Skin is warm and dry.     Capillary Refill: Capillary refill takes less than 2 seconds.     Findings: No erythema or rash.  Neurological:     Mental Status: He is alert and oriented to person, place, and time.     Motor: No abnormal muscle tone.     Coordination: Coordination normal.  Psychiatric:        Behavior: Behavior normal.      ED Treatments / Results  Labs (all labs ordered are listed, but only abnormal results are displayed) Labs Reviewed - No data to display  EKG EKG Interpretation  Date/Time:  Friday November 25 2018 16:43:31 EST Ventricular Rate:  94 PR Interval:  162 QRS Duration: 86 QT Interval:  336 QTC Calculation: 420 R Axis:   -20 Text Interpretation:  Normal sinus rhythm Possible Anterior infarct , age undetermined Abnormal ECG since last tracing no significant change Confirmed by Rolan BuccoBelfi, Melanie [phone]([id number]) on  11/25/2018  4:45:41 PM   Radiology Dg Chest 2 View  Result Date: 11/25/2018 CLINICAL DATA:  Chest pain congestion EXAM: CHEST - 2 VIEW COMPARISON:  11/14/2018, 10/28/2018 FINDINGS: Postsurgical changes in the lower cervical spine. No focal opacity or pleural effusion. Stable cardiomediastinal silhouette. No pneumothorax. IMPRESSION: No active cardiopulmonary disease. Electronically Signed   By: Jasmine PangKim  Fujinaga M.D.   On: 11/25/2018 19:03    Procedures Procedures (including critical care time)  Medications Ordered in ED Medications  ipratropium-albuterol (DUONEB) 0.5-2.5 (3) MG/3ML nebulizer solution 3 mL (3 mLs Nebulization Given 11/25/18 1935)  dexamethasone (DECADRON) injection 10 mg (10 mg Intramuscular Given 11/25/18 1927)     Initial Impression / Assessment and Plan / ED Course  I have reviewed the triage vital signs and the nursing notes.  Pertinent labs & imaging results that were available during my care of the patient were reviewed by me and considered in my medical decision making (see chart for details).     Patient will be given treatment for upper respiratory infection and bronchitis.  I feel that the patient needs more symptomatic relief as well.  I have advised the patient of the plan and all questions were answered.  His vital signs have not shown any signs of hypoxia or tachycardia.  The patient has nasal congestion and drainage along with hoarseness cough and I feel that this is an upper respiratory infection.  Patient is advised the plan and all questions were answered.  Final Clinical Impressions(s) / ED Diagnoses   Final diagnoses:  None    ED Discharge Orders    None       Charlestine NightLawyer, Takia Runyon, PA-C 11/25/18 1953    Rolan BuccoBelfi, Melanie, MD 11/25/18 2257

## 2018-12-12 IMAGING — DX DG HAND COMPLETE 3+V*R*
3 series · 3 of 3 positions shown · non-contrast
Comparison: RIGHT finger radiograph [DATE]

CLINICAL DATA: Pain and swelling since blunt trauma [REDACTED].
History of finger drainage.

EXAM:
RIGHT HAND - COMPLETE 3+ VIEW

[hand pa]
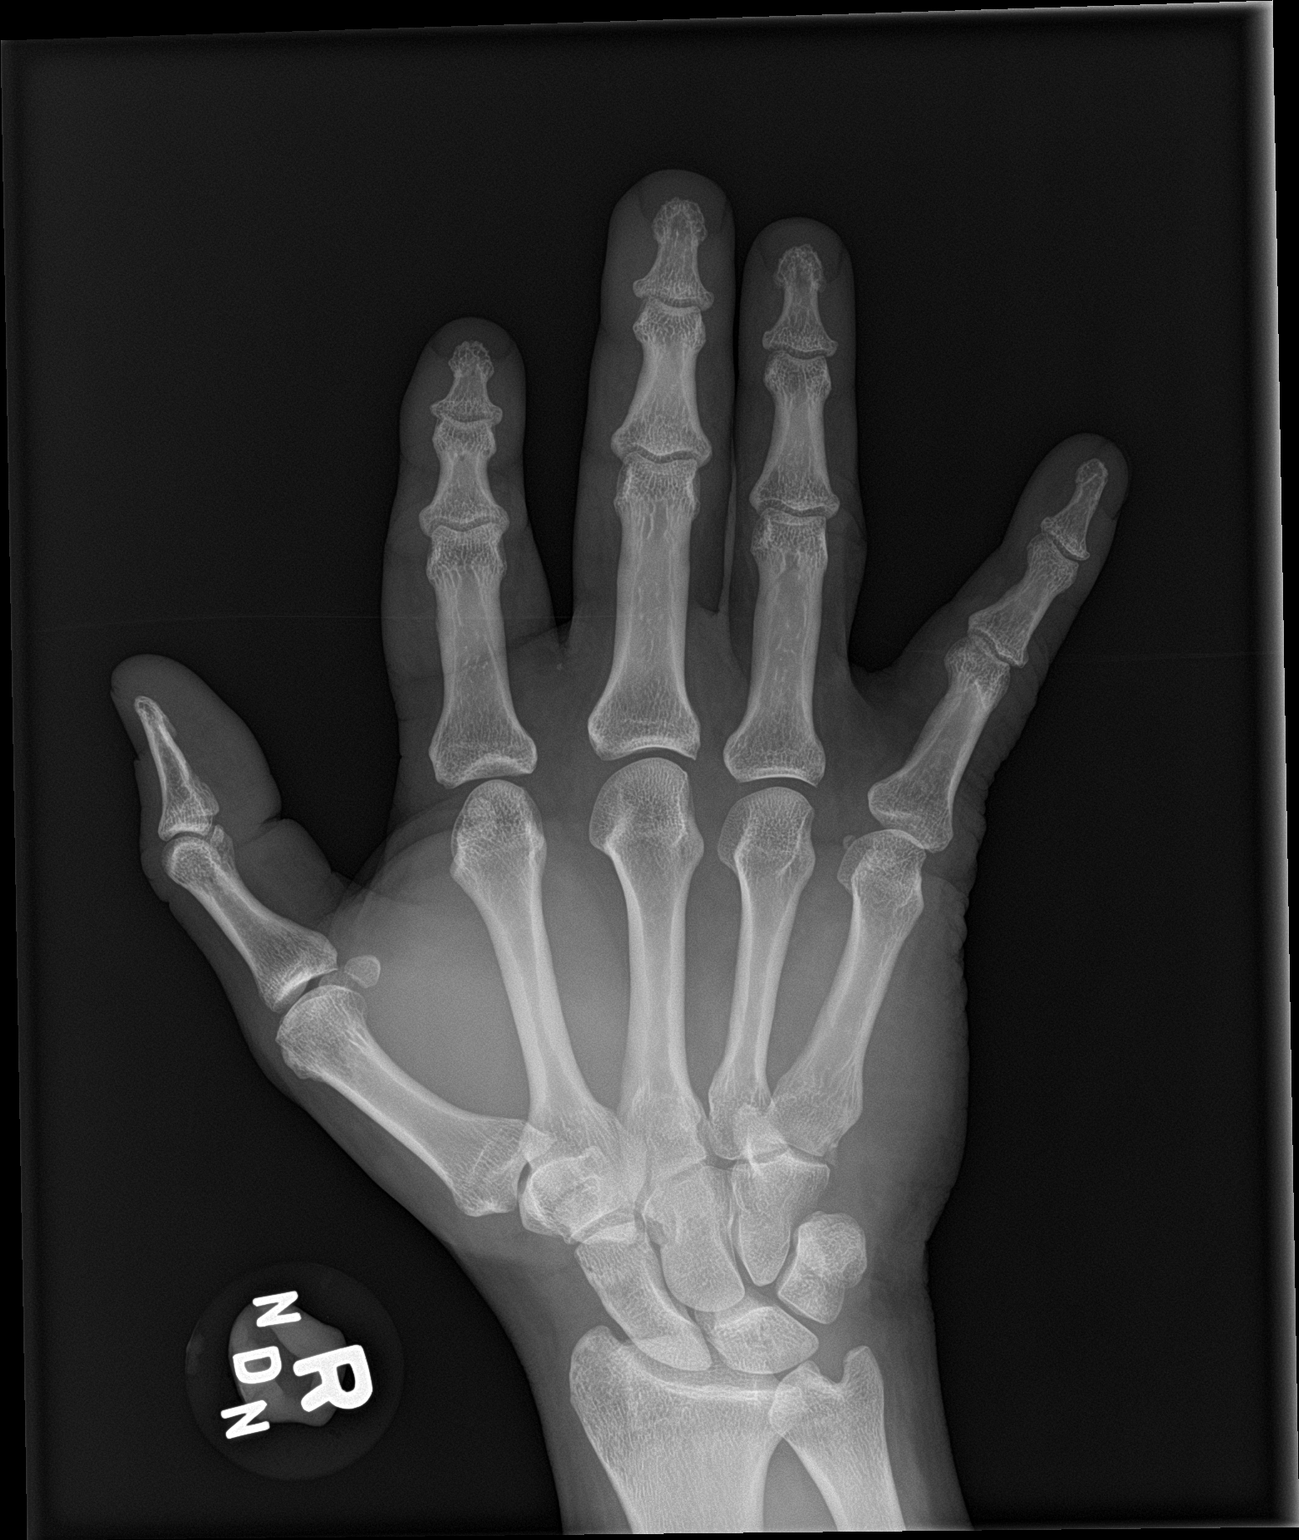

[hand obl]
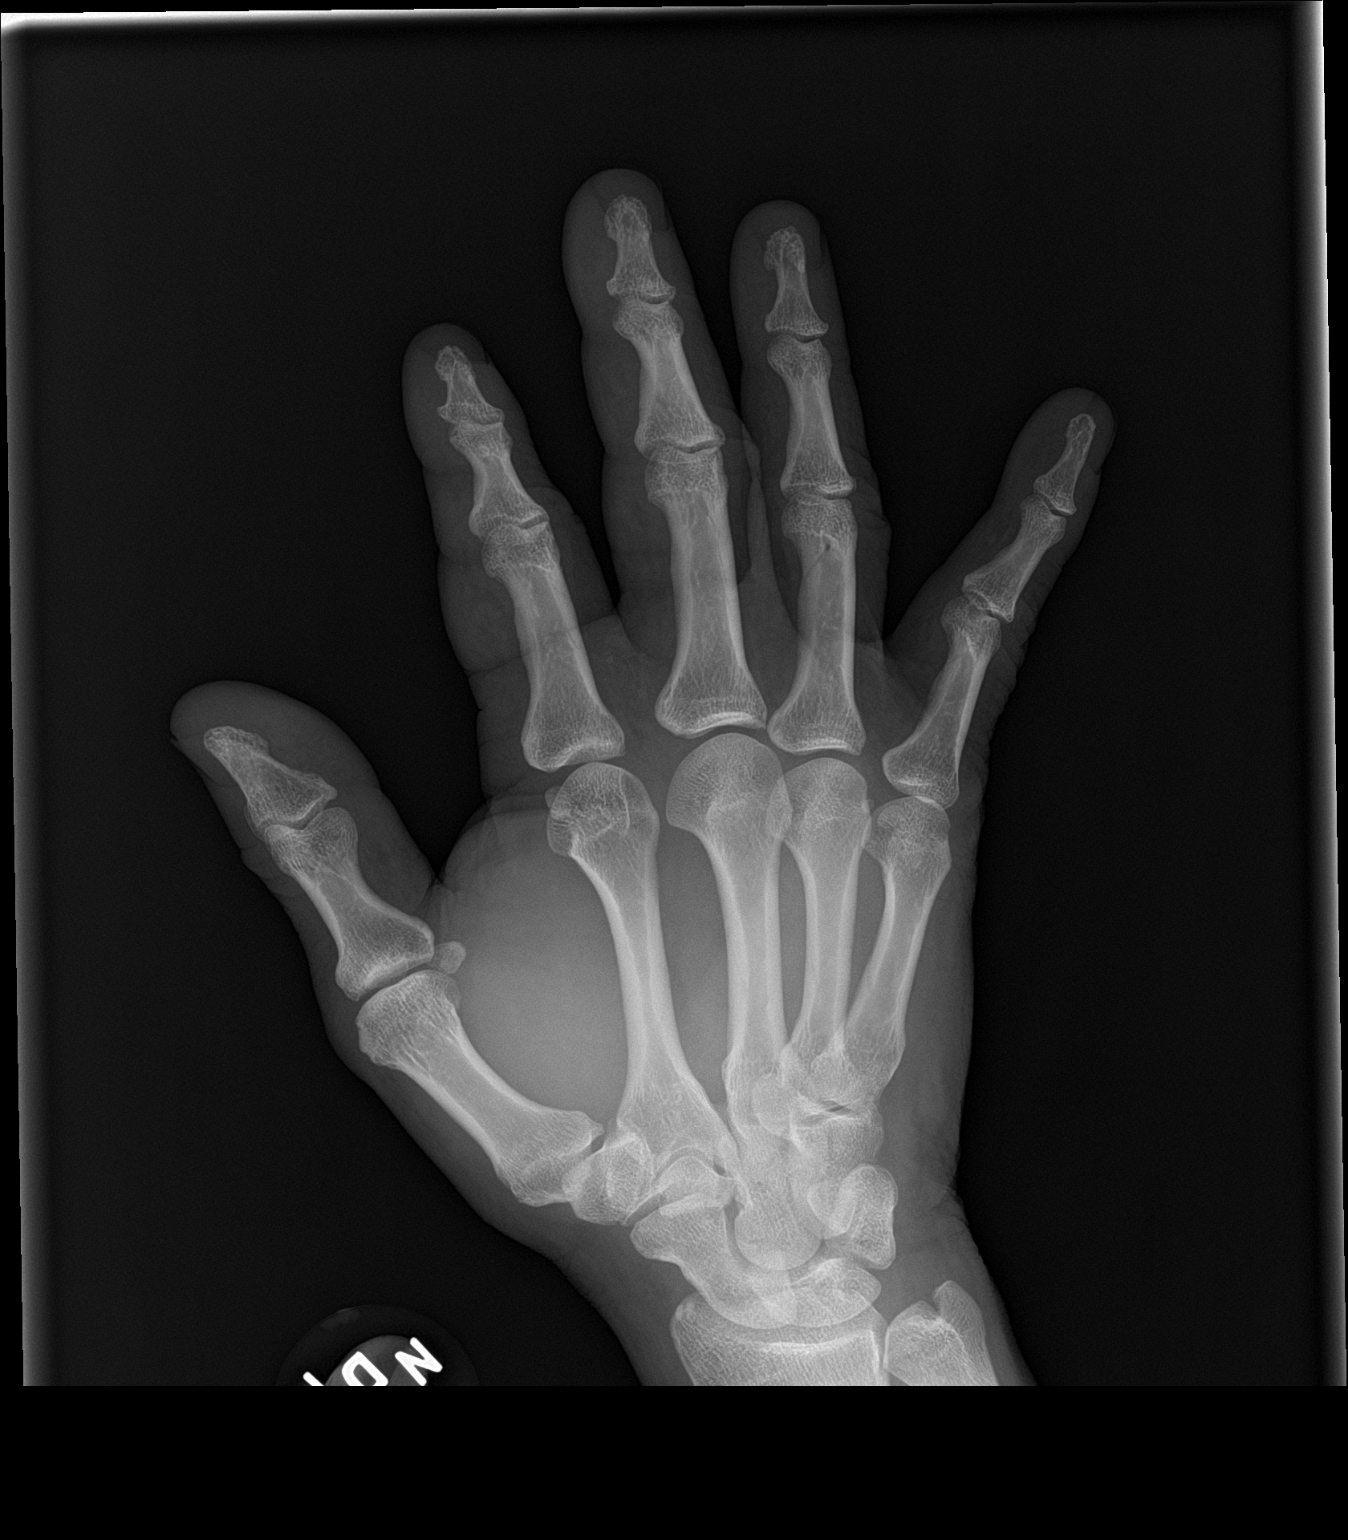

[hand lat]
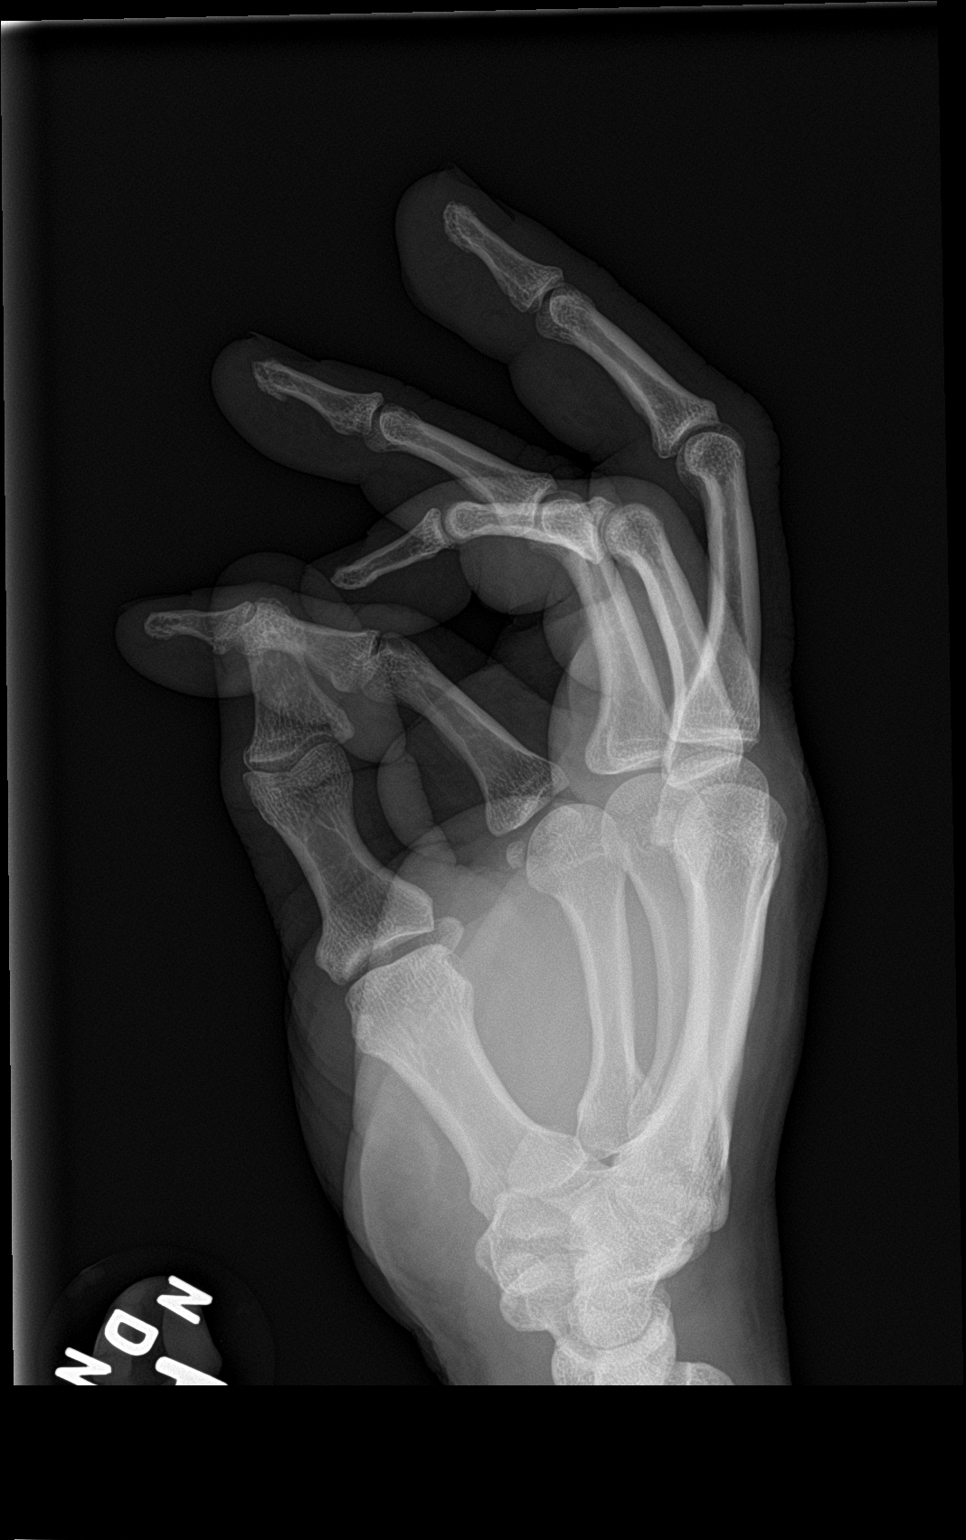

[3 of 3 positions shown; findings below may reference images not displayed]

FINDINGS: There is no evidence of fracture or dislocation. There is no
evidence of arthropathy or other focal bone abnormality. Soft
tissues are unremarkable.
IMPRESSION: Negative.

## 2019-05-09 ENCOUNTER — Emergency Department (HOSPITAL_BASED_OUTPATIENT_CLINIC_OR_DEPARTMENT_OTHER): Payer: Medicare Other

## 2019-05-09 ENCOUNTER — Other Ambulatory Visit: Payer: Self-pay

## 2019-05-09 ENCOUNTER — Encounter (HOSPITAL_BASED_OUTPATIENT_CLINIC_OR_DEPARTMENT_OTHER): Payer: Self-pay | Admitting: *Deleted

## 2019-05-09 ENCOUNTER — Emergency Department (HOSPITAL_BASED_OUTPATIENT_CLINIC_OR_DEPARTMENT_OTHER)
Admission: EM | Admit: 2019-05-09 | Discharge: 2019-05-09 | Disposition: A | Discharge status: Home / Self Care | Payer: Medicare Other | Attending: Emergency Medicine | Admitting: Emergency Medicine

## 2019-05-09 DIAGNOSIS — M62838 Other muscle spasm: Secondary | ICD-10-CM | POA: Diagnosis not present

## 2019-05-09 DIAGNOSIS — R079 Chest pain, unspecified: Secondary | ICD-10-CM | POA: Diagnosis not present

## 2019-05-09 DIAGNOSIS — R4781 Slurred speech: Secondary | ICD-10-CM | POA: Diagnosis present

## 2019-05-09 DIAGNOSIS — F445 Conversion disorder with seizures or convulsions: Secondary | ICD-10-CM | POA: Diagnosis not present

## 2019-05-09 DIAGNOSIS — Z20828 Contact with and (suspected) exposure to other viral communicable diseases: Secondary | ICD-10-CM | POA: Diagnosis not present

## 2019-05-09 DIAGNOSIS — E1122 Type 2 diabetes mellitus with diabetic chronic kidney disease: Secondary | ICD-10-CM | POA: Insufficient documentation

## 2019-05-09 DIAGNOSIS — Z79899 Other long term (current) drug therapy: Secondary | ICD-10-CM | POA: Insufficient documentation

## 2019-05-09 DIAGNOSIS — R509 Fever, unspecified: Secondary | ICD-10-CM | POA: Insufficient documentation

## 2019-05-09 DIAGNOSIS — Z87891 Personal history of nicotine dependence: Secondary | ICD-10-CM | POA: Insufficient documentation

## 2019-05-09 DIAGNOSIS — N182 Chronic kidney disease, stage 2 (mild): Secondary | ICD-10-CM | POA: Diagnosis not present

## 2019-05-09 DIAGNOSIS — R569 Unspecified convulsions: Secondary | ICD-10-CM | POA: Diagnosis not present

## 2019-05-09 DIAGNOSIS — I129 Hypertensive chronic kidney disease with stage 1 through stage 4 chronic kidney disease, or unspecified chronic kidney disease: Secondary | ICD-10-CM | POA: Insufficient documentation

## 2019-05-09 DIAGNOSIS — Z794 Long term (current) use of insulin: Secondary | ICD-10-CM | POA: Diagnosis not present

## 2019-05-09 DIAGNOSIS — R072 Precordial pain: Secondary | ICD-10-CM

## 2019-05-09 DIAGNOSIS — Z8673 Personal history of transient ischemic attack (TIA), and cerebral infarction without residual deficits: Secondary | ICD-10-CM | POA: Diagnosis not present

## 2019-05-09 LAB — COMPREHENSIVE METABOLIC PANEL
ALT: 85 U/L — ABNORMAL HIGH (ref 0–44)
AST: 71 U/L — ABNORMAL HIGH (ref 15–41)
Albumin: 4.4 g/dL (ref 3.5–5.0)
Alkaline Phosphatase: 62 U/L (ref 38–126)
Anion gap: 11 (ref 5–15)
BUN: 18 mg/dL (ref 6–20)
CO2: 21 mmol/L — ABNORMAL LOW (ref 22–32)
Calcium: 9.2 mg/dL (ref 8.9–10.3)
Chloride: 101 mmol/L (ref 98–111)
Creatinine, Ser: 1.26 mg/dL — ABNORMAL HIGH (ref 0.61–1.24)
GFR calc Af Amer: >60 mL/min (ref >60)
GFR calc non Af Amer: >60 mL/min (ref >60)
Glucose, Bld: 282 mg/dL — ABNORMAL HIGH (ref 70–99)
Potassium: 4.3 mmol/L (ref 3.5–5.1)
Sodium: 133 mmol/L — ABNORMAL LOW (ref 135–145)
Total Bilirubin: 0.5 mg/dL (ref 0.3–1.2)
Total Protein: 7.5 g/dL (ref 6.5–8.1)

## 2019-05-09 LAB — RAPID URINE DRUG SCREEN, HOSP PERFORMED
Amphetamines: NOT DETECTED
Barbiturates: NOT DETECTED
Benzodiazepines: NOT DETECTED
Cocaine: NOT DETECTED
Opiates: POSITIVE — AB
Tetrahydrocannabinol: NOT DETECTED

## 2019-05-09 LAB — URINALYSIS, MICROSCOPIC (REFLEX): WBC, UA: NONE SEEN WBC/hpf (ref 0–5)

## 2019-05-09 LAB — CBC
HCT: 44.7 % (ref 39.0–52.0)
Hemoglobin: 14.3 g/dL (ref 13.0–17.0)
MCH: 27.5 pg (ref 26.0–34.0)
MCHC: 32 g/dL (ref 30.0–36.0)
MCV: 86 fL (ref 80.0–100.0)
Platelets: 150 10*3/uL (ref 150–400)
RBC: 5.2 MIL/uL (ref 4.22–5.81)
RDW: 15 % (ref 11.5–15.5)
WBC: 5.2 10*3/uL (ref 4.0–10.5)
nRBC: 0 % (ref 0.0–0.2)

## 2019-05-09 LAB — URINALYSIS, ROUTINE W REFLEX MICROSCOPIC
Bilirubin Urine: NEGATIVE
Glucose, UA: >=500 mg/dL — AB
Ketones, ur: NEGATIVE mg/dL
Leukocytes,Ua: NEGATIVE
Nitrite: NEGATIVE
Protein, ur: NEGATIVE mg/dL
Specific Gravity, Urine: 1.02 (ref 1.005–1.030)
pH: 6 (ref 5.0–8.0)

## 2019-05-09 LAB — DIFFERENTIAL
Abs Immature Granulocytes: 0.03 10*3/uL (ref 0.00–0.07)
Basophils Absolute: 0 10*3/uL (ref 0.0–0.1)
Basophils Relative: 0 %
Eosinophils Absolute: 0 10*3/uL (ref 0.0–0.5)
Eosinophils Relative: 0 %
Immature Granulocytes: 1 %
Lymphocytes Relative: 22 %
Lymphs Abs: 1.2 10*3/uL (ref 0.7–4.0)
Monocytes Absolute: 0.6 10*3/uL (ref 0.1–1.0)
Monocytes Relative: 12 %
Neutro Abs: 3.4 10*3/uL (ref 1.7–7.7)
Neutrophils Relative %: 65 %

## 2019-05-09 LAB — PROTIME-INR
INR: 1.1 (ref 0.8–1.2)
Prothrombin Time: 13.7 seconds (ref 11.4–15.2)

## 2019-05-09 LAB — CBG MONITORING, ED: Glucose-Capillary: 264 mg/dL — ABNORMAL HIGH (ref 70–99)

## 2019-05-09 LAB — ETHANOL: Alcohol, Ethyl (B): <10 mg/dL (ref <10)

## 2019-05-09 LAB — TROPONIN I
Troponin I: <0.03 ng/mL (ref <0.03)
Troponin I: <0.03 ng/mL (ref <0.03)

## 2019-05-09 LAB — APTT: aPTT: 29 seconds (ref 24–36)

## 2019-05-09 MED ORDER — CLOPIDOGREL BISULFATE 75 MG PO TABS: 75.0000 mg | ORAL_TABLET | Freq: Every day | ORAL | 0 refills | Status: DC

## 2019-05-09 MED ORDER — ACETAMINOPHEN ER 650 MG PO TBCR: 650.0000 mg | EXTENDED_RELEASE_TABLET | Freq: Three times a day (TID) | ORAL | 0 refills | Status: DC

## 2019-05-09 MED ORDER — LEVETIRACETAM IN NACL 1000 MG/100ML IV SOLN
1000.0000 mg | Freq: Once | INTRAVENOUS | Status: AC
  Administered 2019-05-09: 1000 mg via INTRAVENOUS
  Filled 2019-05-09: qty 100

## 2019-05-09 MED ORDER — LORAZEPAM 2 MG/ML IJ SOLN
1.0000 mg | Freq: Once | INTRAMUSCULAR | Status: AC
  Administered 2019-05-09: 1 mg via INTRAVENOUS

## 2019-05-09 MED ORDER — METHOCARBAMOL 500 MG PO TABS: 500.0000 mg | ORAL_TABLET | Freq: Two times a day (BID) | ORAL | 0 refills | Status: DC

## 2019-05-09 MED ORDER — LORAZEPAM 2 MG/ML IJ SOLN
INTRAMUSCULAR | Status: AC
  Filled 2019-05-09: qty 1

## 2019-05-09 MED ORDER — METHOCARBAMOL 500 MG PO TABS
500.0000 mg | ORAL_TABLET | Freq: Once | ORAL | Status: AC
  Administered 2019-05-09: 500 mg via ORAL
  Filled 2019-05-09: qty 1

## 2019-05-09 MED ORDER — ACETAMINOPHEN 325 MG PO TABS
650.0000 mg | ORAL_TABLET | Freq: Once | ORAL | Status: AC
  Administered 2019-05-09: 650 mg via ORAL
  Filled 2019-05-09: qty 2

## 2019-05-09 NOTE — ED Notes (Signed)
Pt aox4.

## 2019-05-09 NOTE — ED Notes (Addendum)
Pt a/o to self, situation at this times. Speech is understandable but not clear. Wife remains at bedside.

## 2019-05-09 NOTE — ED Provider Notes (Addendum)
[facility] EMERGENCY DEPARTMENT Provider Note   CSN: [id number] Arrival date & time: 05/09/19  1605    History   Chief Complaint Chief Complaint  Patient presents with  . Chest Pain  . Back Pain  . Seizures    HPI Nicholas [name] is a 58 y.o. male.     HPI  58 year old male comes in a chief complaint of slurred speech, seizures.  Chest pain.  Patient has history of nonepileptic seizures and epilepsy, on thousand with a gram twice daily Keppra.  Patient also has history of stroke, somatization disorder, complex headache, hypertension.  Patient brought into the ER by his wife.  She reports that patient has been complaining of chest pain for the last few days.  Today he reported the pain was worse, and wanted to come to the ER.  In route he had 2 seizure-like activity.  In the ER patient is slurring and hard to comprehend.  Family reports that in the past when patient had seizures he will have slurring of his speech.  Additionally patient is complaining of right-sided weakness, and family states that he is also had intermittent episodes of weakness of the right side in the past.  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  The event occurred in September, patient was given TPA however subsequent MRI was negative for any evidence of stroke.  Patient's chest pain is midsternal and described as sharp pain.  It has been constant and there is no specific aggravating or relieving factors.  Patient denies any associated new shortness of breath, cough, fevers, chills.  Family also denies any substance abuse or heavy alcohol use.  Past Medical History:  Diagnosis Date  . Asthma   . Bipolar 1 disorder (HCC)   . Borderline glaucoma   . Chronic pain   . Epididymal pain    LEFT  . Epilepsy, 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37---  LAST SEIZURE 1 WK AGO (APPROX ,  10-31-2013)   NO NEUROLOGIST---  PT SEES PCP  DR Lindajo Royal  . Feeling of incomplete bladder emptying   . Frequency of  urination   . Gastric ulcer   . GERD (gastroesophageal reflux disease)   . Hypertension   . Hyperthyroidism    NO MEDS   . Migraine   . Seizures (HCC)   . TIA (transient ischemic attack)   . Type 2 diabetes mellitus [facility])     Patient Active Problem List   Diagnosis Date Noted  . Essential hypertension   . Seizure disorder (HCC) 07/02/2017  . Insulin-requiring or dependent type II diabetes mellitus (HCC) 07/02/2017  . CKD (chronic kidney disease), stage II 07/02/2017  . Normocytic anemia 07/02/2017  . Testicular/scrotal pain 11/09/2013  . Microhematuria 11/09/2013  . Condyloma acuminatum of scrotum 11/09/2013    Past Surgical History:  Procedure Laterality Date  . ANTERIOR CERVICAL DECOMP/DISCECTOMY FUSION  2007   C4  --  C6  . CERVICAL FUSION    . CHOLECYSTECTOMY    . CYSTOSCOPY N/A 11/09/2013   Procedure: CYSTOSCOPY FLEXIBLE;  Surgeon: Bjorn Pippin, MD;  Location: [facility];  Service: Urology;  Laterality: N/A;  . EPIDIDYMECTOMY Left 11/09/2013   Procedure:  LEFT EPIDIDYMECTOMY;  Surgeon: Bjorn Pippin, MD;  Location: [facility];  Service: Urology;  Laterality: Left;  POSSIBLE OUTPATIENT WITH OBSERVATION  . EXCISION LIPOMA LEFT SHOULDER  2004 (APPROX)  . MULTIPLE CYST REMOVED FROM CHEST  AGE 21  . OTHER SURGICAL HISTORY  hemorroid surgery   . TESTICLE REMOVAL Left   . TONSILLECTOMY          Home Medications    Prior to Admission medications   Medication Sig Start Date End Date Taking? Authorizing Provider  acetaminophen (TYLENOL 8 HOUR) 650 MG CR tablet Take 1 tablet (650 mg total) by mouth every 8 (eight) hours. 05/09/19   Derwood Kaplan, MD  acetaminophen-codeine 120-12 MG/5ML solution Take 10 mLs by mouth every 4 (four) hours as needed for moderate pain. 11/25/18   Lawyer, Cristal Deer, PA-C  albuterol (PROVENTIL HFA;VENTOLIN HFA) 108 (90 Base) MCG/ACT inhaler Inhale 1-2 puffs into the lungs every 6 (six) hours as needed for wheezing  or shortness of breath. 11/15/18   Rancour, Jeannett Senior, MD  amitriptyline (ELAVIL) 25 MG tablet Take 25 mg by mouth at bedtime.    [provider]  atorvastatin (LIPITOR) 80 MG tablet Take 80 mg by mouth daily.    [provider]  azithromycin (ZITHROMAX) 250 MG tablet Take 1 tablet (250 mg total) by mouth daily. 03/31/18   Melene Plan, DO  benzonatate (TESSALON) 100 MG capsule Take 1 capsule (100 mg total) by mouth every 8 (eight) hours. 10/28/18   Long, Arlyss Repress, MD  carisoprodol (SOMA) 350 MG tablet Take 1 tablet (350 mg total) 3 (three) times daily as needed by mouth for muscle spasms. 10/13/17   Loren Racer, MD  clindamycin (CLEOCIN) 300 MG capsule Take 1 capsule (300 mg total) by mouth 3 (three) times daily. 07/03/18   Raeford Razor, MD  clopidogrel (PLAVIX) 75 MG tablet Take 1 tablet (75 mg total) by mouth daily. 05/09/19   Derwood Kaplan, MD  clotrimazole (LOTRIMIN) 1 % cream Apply to affected area 2 times daily until it is resolved. 11/26/17   Alvira Monday, MD  doxycycline (VIBRAMYCIN) 100 MG capsule Take 1 capsule (100 mg total) by mouth 2 (two) times daily. 11/15/18   Rancour, Jeannett Senior, MD  fluticasone (FLONASE) 50 MCG/ACT nasal spray Place 2 sprays into both nostrils daily for 7 days. 10/28/18 11/04/18  Maia Plan, MD  fluticasone-salmeterol (ADVAIR HFA) 161-09 MCG/ACT inhaler Inhale 2 puffs into the lungs daily.    [provider]  gabapentin (NEURONTIN) 300 MG capsule Take 300 mg by mouth 2 (two) times daily.     [provider]  Guaifenesin 1200 MG TB12 Take 1 tablet (1,200 mg total) by mouth 2 (two) times daily. 11/25/18   Lawyer, Cristal Deer, PA-C  insulin glargine (LANTUS) 100 UNIT/ML injection Inject 45 Units into the skin at bedtime.     [provider]  insulin lispro (HUMALOG) 100 UNIT/ML injection Inject 6 Units into the skin 3 (three) times daily before meals. Sliding scale    [provider]  insulin regular (NOVOLIN  R,HUMULIN R) 100 units/mL injection Inject 50 Units into the skin 4 (four) times daily -  before meals and at bedtime.    [provider]  levETIRAcetam (KEPPRA) 250 MG tablet Take 1 tablet (250 mg total) by mouth 2 (two) times daily. 07/29/18   Charlynne Pander, MD  levofloxacin (LEVAQUIN) 750 MG tablet Take 1 tablet (750 mg total) by mouth daily. 11/25/18   Lawyer, Cristal Deer, PA-C  lidocaine (LIDODERM) 5 % Place 1 patch onto the skin daily. Remove & Discard patch within 12 hours or as directed by MD 05/16/18   Petrucelli, Lelon Mast R, PA-C  lidocaine (XYLOCAINE) 2 % solution Use as directed 15 mLs in the mouth or throat as needed for mouth  pain. 03/26/18   Rancour, Jeannett Senior, MD  losartan (COZAAR) 50 MG tablet Take 50 mg by mouth daily.    [provider]  metFORMIN (GLUCOPHAGE) 500 MG tablet Take 1,000 mg by mouth 2 (two) times daily with a meal.     [provider]  methocarbamol (ROBAXIN) 500 MG tablet Take 1 tablet (500 mg total) by mouth 2 (two) times daily. 05/09/19   Derwood Kaplan, MD  nortriptyline (PAMELOR) 50 MG capsule Take 50 mg at bedtime by mouth.    [provider]  promethazine (PHENERGAN) 25 MG tablet Take 1 tablet (25 mg total) by mouth every 6 (six) hours as needed for nausea or vomiting. 05/04/17   Vanetta Mulders, MD  promethazine-dextromethorphan (PROMETHAZINE-DM) 6.25-15 MG/5ML syrup Take 5 mLs by mouth 4 (four) times daily as needed for cough. 08/06/17   Rolland Porter, MD  rizatriptan (MAXALT) 10 MG tablet Take 10 mg by mouth as needed for migraine. May repeat in 2 hours if needed    [provider]  sucralfate (CARAFATE) 1 GM/10ML suspension Take 10 mLs (1 g total) by mouth 4 (four) times daily -  with meals and at bedtime. 06/09/17   Palumbo, April, MD  VALSARTAN PO Take 325 mg by mouth daily.    [provider]    Family History No family history on file.  Social History Social History   Tobacco Use  . Smoking status:  Former Smoker    Packs/day: 0.25    Years: 15.00    Pack years: 3.75    Types: Cigarettes    Last attempt to quit: 11/08/1992    Years since quitting: 26.5  . Smokeless tobacco: Never Used  Substance Use Topics  . Alcohol use: No  . Drug use: No     Allergies   Nsaids; Tramadol; Amoxicillin; Ampicillin; Dilantin [phenytoin]; Penicillins; Risperidone and related; Strawberry extract; Bactrim [sulfamethoxazole-trimethoprim]; Depakote [divalproex sodium]; Ultram [tramadol hcl]; and Oatmeal   Review of Systems Review of Systems  Constitutional: Positive for activity change.  Respiratory: Negative for shortness of breath.   Cardiovascular: Positive for chest pain.  Gastrointestinal: Negative for nausea and vomiting.  Neurological: Positive for speech difficulty and numbness.  All other systems reviewed and are negative.    Physical Exam Updated Vital Signs BP (!) 141/107   Pulse (!) 107   Temp (!) 100.8 F (38.2 C) (Oral)   Resp (!) 26   Ht  (1.702 m)   Wt 93.9 kg   SpO2 96%   BMI 32.42 kg/m   Physical Exam Vitals signs and nursing note reviewed.  Constitutional:      Appearance: He is well-developed.  HENT:     Head: Normocephalic and atraumatic.  Eyes:     Pupils: Pupils are equal, round, and reactive to light.  Neck:     Musculoskeletal: Normal range of motion and neck supple.     Vascular: No JVD.  Cardiovascular:     Rate and Rhythm: Normal rate and regular rhythm.     Pulses:          Radial pulses are 2+ on the right side and 2+ on the left side.  Pulmonary:     Effort: Pulmonary effort is normal. No respiratory distress.     Breath sounds: Normal breath sounds. No wheezing.  Abdominal:     General: Bowel sounds are normal. There is no distension.     Palpations: Abdomen is soft.     Tenderness: There is no  abdominal tenderness. There is no guarding or rebound.  Skin:    General: Skin is warm and dry.  Neurological:     Mental Status: He is  alert and oriented to person, place, and time.     Cranial Nerves: No cranial nerve deficit.     Coordination: Coordination normal.     Comments: Patient has subjective numbness over the right face, upper extremity and lower extremity. He has slurred speech. Extraocular muscles are intact and there is no nystagmus. Upper and lower extremity strength is 4+ out of 5, but on first attempt patient had a drift in left upper extremity.      ED Treatments / Results  Labs (all labs ordered are listed, but only abnormal results are displayed) Labs Reviewed  COMPREHENSIVE METABOLIC PANEL - Abnormal; Notable for the following components:      Result Value   Sodium 133 (*)    CO2 21 (*)    Glucose, Bld 282 (*)    Creatinine, Ser 1.26 (*)    AST 71 (*)    ALT 85 (*)    All other components within normal limits  RAPID URINE DRUG SCREEN, HOSP PERFORMED - Abnormal; Notable for the following components:   Opiates POSITIVE (*)    All other components within normal limits  URINALYSIS, ROUTINE W REFLEX MICROSCOPIC - Abnormal; Notable for the following components:   Glucose, UA >=500 (*)    Hgb urine dipstick TRACE (*)    All other components within normal limits  URINALYSIS, MICROSCOPIC (REFLEX) - Abnormal; Notable for the following components:   Bacteria, UA RARE (*)    All other components within normal limits  CBG MONITORING, ED - Abnormal; Notable for the following components:   Glucose-Capillary 264 (*)    All other components within normal limits  NOVEL CORONAVIRUS, NAA (HOSPITAL ORDER, SEND-OUT TO REF LAB)  URINE CULTURE  ETHANOL  PROTIME-INR  APTT  CBC  DIFFERENTIAL  TROPONIN I  TROPONIN I    EKG EKG Interpretation  Date/Time:  Tuesday May 09 2019 16:08:47 EDT Ventricular Rate:  105 PR Interval:    QRS Duration: 91 QT Interval:  324 QTC Calculation: 429 R Axis:   -38 Text Interpretation:  Sinus tachycardia Left axis deviation Borderline T wave abnormalities No acute  changes No significant change since last tracing Confirmed by Derwood Kaplan ([id number]) on 05/09/2019 5:29:24 PM   Radiology Ct Head Wo Contrast  Result Date: 05/09/2019 CLINICAL DATA:  58 year old male with lethargy, diaphoresis following chest pains. EXAM: CT HEAD WITHOUT CONTRAST TECHNIQUE: Contiguous axial images were obtained from the base of the skull through the vertex without intravenous contrast. COMPARISON:  Head CT 04/30/2019 and earlier. FINDINGS: Brain: Cerebral volume remains normal. Incidental mild dural calcification again noted. No midline shift, ventriculomegaly, mass effect, evidence of mass lesion, intracranial hemorrhage or evidence of cortically based acute infarction. Gray-white matter differentiation is within normal limits throughout the brain. Faint dystrophic calcification at the basal ganglia is stable. Vascular: No suspicious intracranial vascular hyperdensity. Skull: Negative. Sinuses/Orbits: Visualized paranasal sinuses and mastoids are stable and well pneumatized. Other: No acute orbit or scalp soft tissue findings. IMPRESSION: Stable and normal noncontrast CT appearance of the brain. Electronically Signed   By: Odessa Fleming M.D.   On: 05/09/2019 17:01    Procedures Procedures (including critical care time)  Medications Ordered in ED Medications  LORazepam (ATIVAN) injection 1 mg (1 mg Intravenous Given 05/09/19 1625)  levETIRAcetam (KEPPRA) IVPB 1000 mg/100 mL premix (0  mg Intravenous Stopped 05/09/19 1657)  acetaminophen (TYLENOL) tablet 650 mg (650 mg Oral Given 05/09/19 2032)  methocarbamol (ROBAXIN) tablet 500 mg (500 mg Oral Given 05/09/19 2032)     Initial Impression / Assessment and Plan / ED Course  I have reviewed the triage vital signs and the nursing notes.  Pertinent labs & imaging results that were available during my care of the patient were reviewed by me and considered in my medical decision making (see chart for details).  Clinical Course as of May 09 2115   Tue May 09, 2019  1900 Patient reassessed.  We went over his CT findings and informed him that his CT head is negative.  It is noted that patient has a low-grade fever.  He denies any chest pain, cough, UTI-like symptoms.  He denies any COVID-19 exposures.  He is noted to be slightly tachycardic.  Repeat exam of the lungs, and abdomen is reassuring.  No rash appreciated.  There is no meningismus.  Plan is to monitor him in the ER for further to ensure there is no decline.   [AN]  2113 Patient's UA returned negative. I reassessed him 1 more time -and he continues to have no progression of his symptoms.  I assessed his back one more time and there is no focal spine tenderness.  In fact we appreciated that he has clear spasms in the scapular region, which is the back pain that was bothering him when he came to the ER.  Patient now informs me that he is at a boarding home and there are 3 other people in the house.  Patient has not been going in and out, but other roommates might have.  No one is sick as far as he can tell.  We will get a COVID-19 outpatient test on him along with urine cultures.  I am not certain why he is having fever -but it does not appear that there is a clear focus of bacterial infection that needs to be treated.  Patient did not even know he had a fever when he arrived.  Perhaps that is what caused him to have a nonepileptic seizure today.  Strict ER return precautions discussed with the patient and his wife.  They will return to the ER if he starts having severe headache, confusion, neck pain, neck stiffness, worsening fevers, weakness, chills, sweats.  Patient's delta troponins are negative.  He is chest pain-free on reassessment.  Strict ER return precautions discussed for ACS.  He will return to the ER if he starts having severe chest pain, neck pain, diaphoresis, shortness of breath.  Additionally, there is no signs of DVT and no high risk features to have PE.  He is tachycardic and  mildly tachypneic.  I do not think we need a CT PE at this time.  The patient appears reasonably screened and/or stabilized for discharge and I doubt any other medical condition or other [facility] requiring further screening, evaluation, or treatment in the ED at this time prior to discharge.  Results from the ER workup discussed with the patient face to face and all questions answered to the best of my ability. The patient is safe for discharge with strict return precautions.   Urinalysis, Microscopic (reflex)(!) [AN]    Clinical Course User Index [AN] Derwood KaplanNanavati, Robie Mcniel, MD       58 year old comes in a chief complaint of slurred speech, seizures, chest pain.  He has history of nonepileptic seizures, stroke, pneumatization disorder, hypertension.  After speaking with the patient and his wife it appears that he gets periodic episodes of seizures, typically woke by anger.  He also intermittently will get slurred speech and extremity weakness.  Every time he had a seizure he has slurred speech according to the wife.  He is also complaining of chest discomfort that has been going on for the last 3 days.  His Cardiologic exam is normal.  Radial pulses are bilaterally equal.  Neurologic exam reveals subjective numbness of the right side and slurring of the speech.  I ordered CT head and consulted telemetry neuro.  Initial impression is that patient is having Todd's paralysis and perhaps postictal phase.  Psychogenic component also considered in the differential, especially given that his left upper extremity initially was weaker, but on encouragement during second attempt he strength was 4+ out of 5 and equal to the right side.   I reviewed patient's record.  He was admitted with stroke in September of last year, TPA was given but the subsequent MRI was negative.  He also has had nonepileptic seizures over there.   While I was assessing the patient he started having left-sided generalized tonic-clonic  shaking, which resolved on its own.  Patient was not confused.  No incontinence.  On my repeat assessment I informed him that I expect him to get better, is likely the contributing factor is stress.  Patient's wife agreed and immediately we started noticing improvement in his ability to talk and his fluency.  Telemetry neurology has assessed the patient.  They are recommending that we start patient on Plavix and have him follow-up with neurology as an outpatient as long as his symptoms do get worse and the CT scan of the brain is negative.  They do not want any Keppra adjustment made at this time.  Final Clinical Impressions(s) / ED Diagnoses   Final diagnoses:  Fever, unspecified fever cause  Psychogenic nonepileptic seizure  Chest pain, precordial  Muscle spasm    ED Discharge Orders         Ordered    acetaminophen (TYLENOL 8 HOUR) 650 MG CR tablet  Every 8 hours     05/09/19 2051    methocarbamol (ROBAXIN) 500 MG tablet  2 times daily     05/09/19 2051    clopidogrel (PLAVIX) 75 MG tablet  Daily     05/09/19 2052           Derwood Kaplan, MD 05/09/19 2116    Derwood Kaplan, MD 05/24/19 1038

## 2019-05-09 NOTE — ED Notes (Signed)
Pt wife: Alii Larter [phone]

## 2019-05-09 NOTE — ED Notes (Signed)
Pt wife at bedside. . . 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 Per wife- this is normal for pt. Pt is c/o chest pain and back pain. During exam- pt shaking limbs and appears to be having seizure like activity.

## 2019-05-09 NOTE — ED Notes (Signed)
Pt given ginger ale.

## 2019-05-09 NOTE — Discharge Instructions (Addendum)
We saw in the ER for chest discomfort and seizure-like activity. While in the ER, we noted that you also have a low-grade fever.  For your neurologic symptoms we had consulted neurology.  They recommend that you start taking Plavix and follow-up with your neurologist for further care.  It appears that your seizures are mediated by stress, your neurologist can further adjust your seizure medications if needed.  You have a fever, which we are not clear what the cause is.  Although work-up in the ER is not suggestive of any source of infection.  Additionally, your back pain appears to be related to muscle spasms.  Take the muscle relaxant that as prescribed.  Please return to the ER if your symptoms worsen; you have increased pain, fevers, chills, inability to keep any medications down, confusion. Otherwise see the outpatient doctor as requested.

## 2019-05-09 NOTE — ED Notes (Signed)
Pt placed on 4L nasal cannula.  

## 2019-05-09 NOTE — ED Notes (Signed)
Pt placed on 2L [place]

## 2019-05-09 NOTE — ED Notes (Signed)
PT to CT with this RN.

## 2019-05-09 NOTE — Consult Note (Signed)
[facility] Consult Services  Stat Consult  Date of Service:   05/09/2019 16:44:58  Impression:     .  Seizure  Comments/Sign-Out: i would recommend that his levetiracetam dose be increased to 1500 mg twice daily for better seizure control, and mri head to evaluate complaints of 3 days of subjective speech problems, no objective slurred speech. No mri available at the treating facility. so would recommend plavix to cover the very low chance that speech prob is related to 3 days of ischemia not noted on Ct. with follow up out patient neurology in a week  CT HEAD: Showed No Acute Hemorrhage or Acute Core Infarct  Metrics: [facility] Notification Time: 05/09/2019 16:43:12 Stamp Time: 05/09/2019 16:44:58 Callback Response Time: 05/09/2019 16:45:53 Video Start Time: 05/09/2019 16:52:49  Our recommendations are outlined below.  Recommendations:     .  Start Keppra 1000 mg BID  Imaging Studies:     .  MRI Head  Disposition: Neurology Follow Up Recommended  Sign Out:     .  Discussed with Emergency Department Provider  ----------------------------------------------------------------------------------------------------  Chief Complaint: seizure  History of Present Illness: Patient is a 58 year old Male.  He has some chest pain which has been a problem for a couple weeks, He couple seizures 3 days and then his speech has been slurred since then. He is on keppra 1000mg  bid. When he has seizure he feels tired and shaky and is provoked by anger. Review of the records indicates that he has documented non-epileptic seizures by video monitoring. Has dx of somatozation. Outpt neurology recommended continuing keppra given hx of seizures since age 77.  Examination: BP(136/102), Pulse(109),  Neuro Exam:  General: Alert,Awake, Place, Person  Speech: Fluent:  Language: Intact:  Face: Symmetric:  Facial Sensation: Intact:  Visual Fields:  Intact:  Extraocular Movements: Intact:  Motor Exam: No Drift:  Sensation: Intact:  Coordination: Intact:  not oriented to month, year per wife   Due to the immediate potential for life-threatening deterioration due to underlying acute neurologic illness, I spent 35 minutes providing critical care. This time includes time for face to face visit via telemedicine, review of medical records, imaging studies and discussion of findings with providers, the patient and/or family.   Dr Georgina Snell   [facility] [phone]   Case [id number]

## 2019-05-09 NOTE — ED Triage Notes (Signed)
Pt was assisted out of POV. No verbal response. He appears lethargic, diaphoretic. Wife states he has c.o chest pain this afternoon. Back pain. She states he has a hx of seizures and has had seizures today.

## 2019-05-09 NOTE — ED Notes (Signed)
EDP at bedside  

## 2019-05-09 NOTE — ED Notes (Signed)
teleneuro at bedside.  

## 2019-05-10 LAB — URINE CULTURE: Culture: NO GROWTH

## 2019-05-15 LAB — NOVEL CORONAVIRUS, NAA (HOSP ORDER, SEND-OUT TO REF LAB; TAT 18-24 HRS): SARS-CoV-2, NAA: DETECTED — AB

## 2019-08-22 IMAGING — DX DG CHEST 2V
2 series · 2 of 2 positions shown · non-contrast
Comparison: 10/28/2018, 06/16/2018

CLINICAL DATA: Cough and chest pain

EXAM:
CHEST - 2 VIEW

[chest lat]
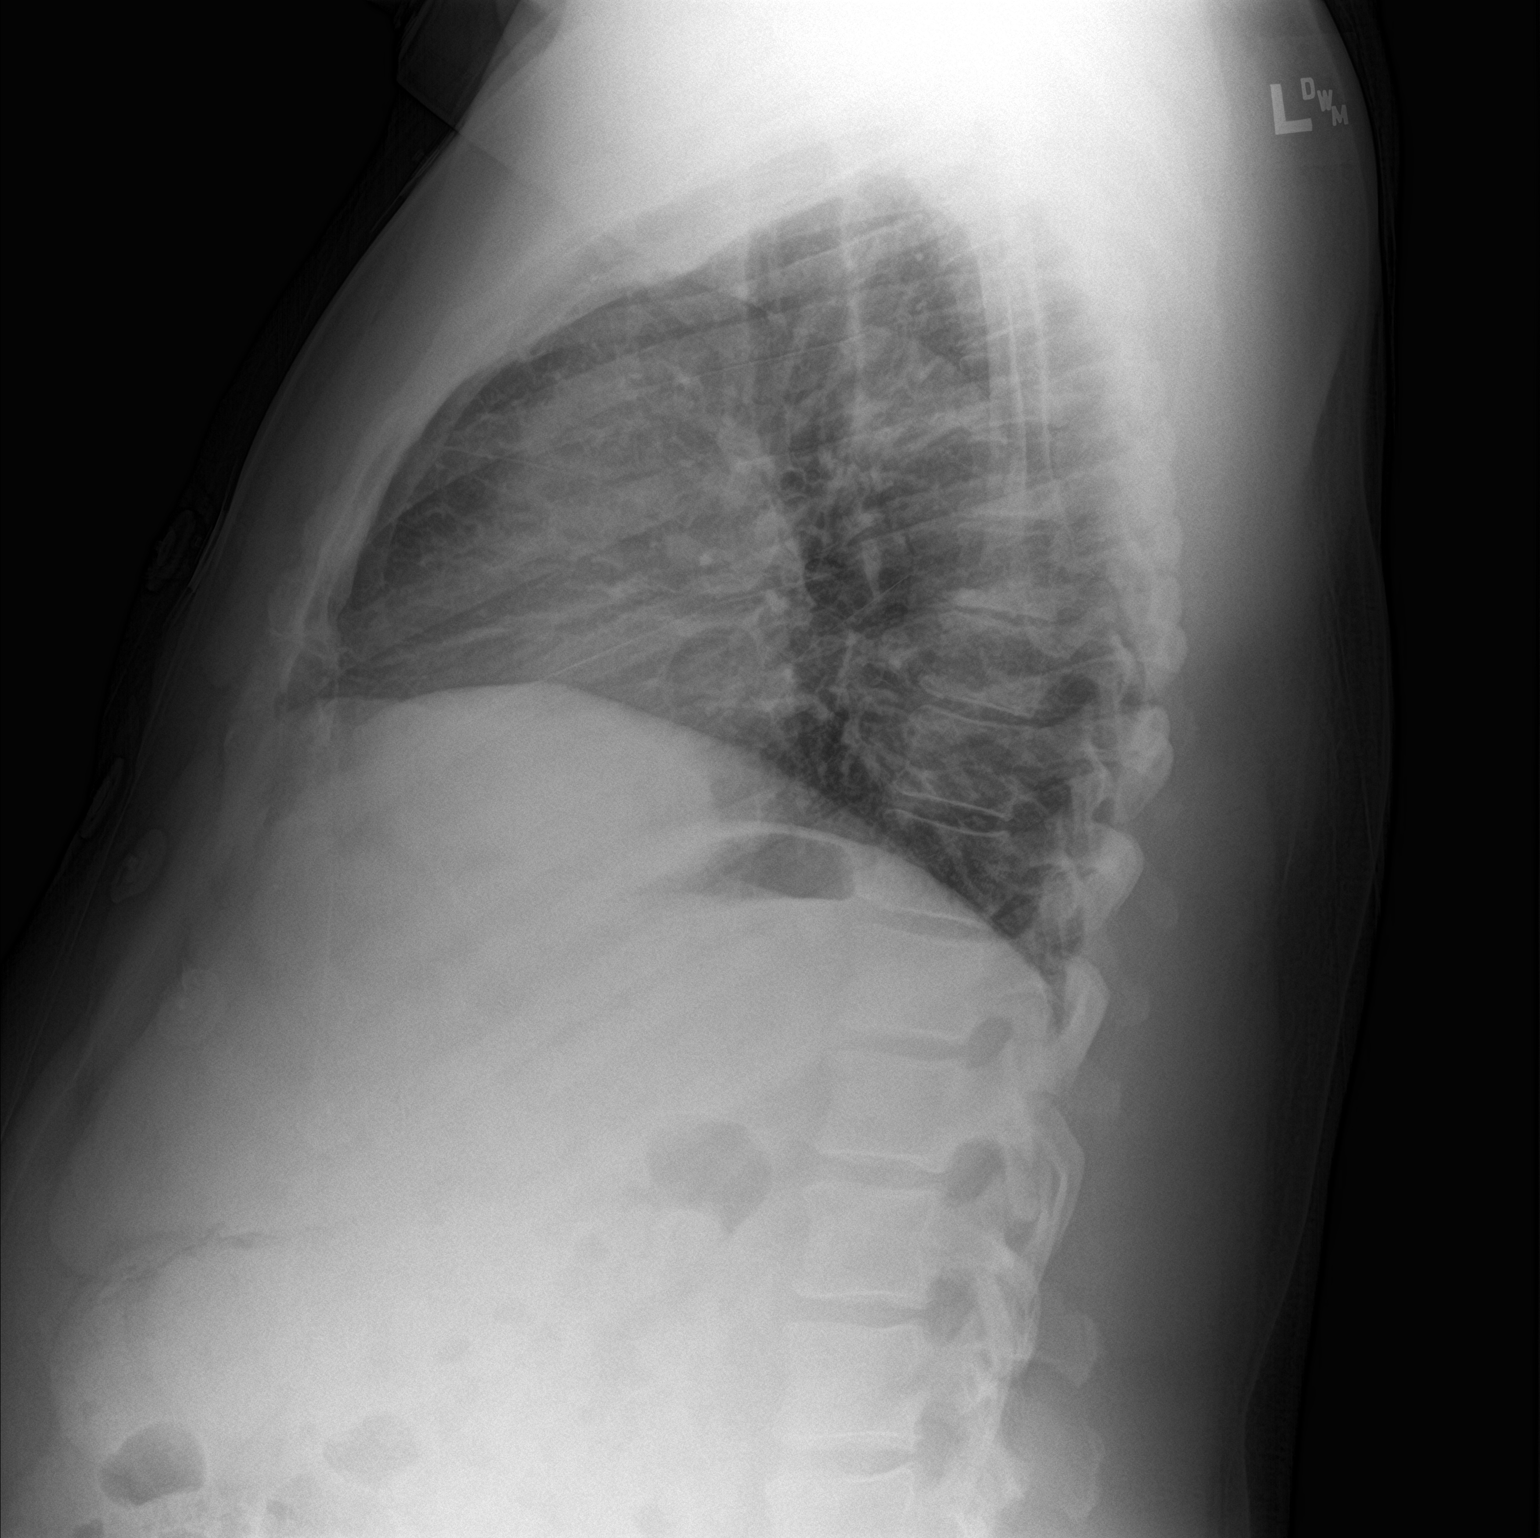

[chest pa]
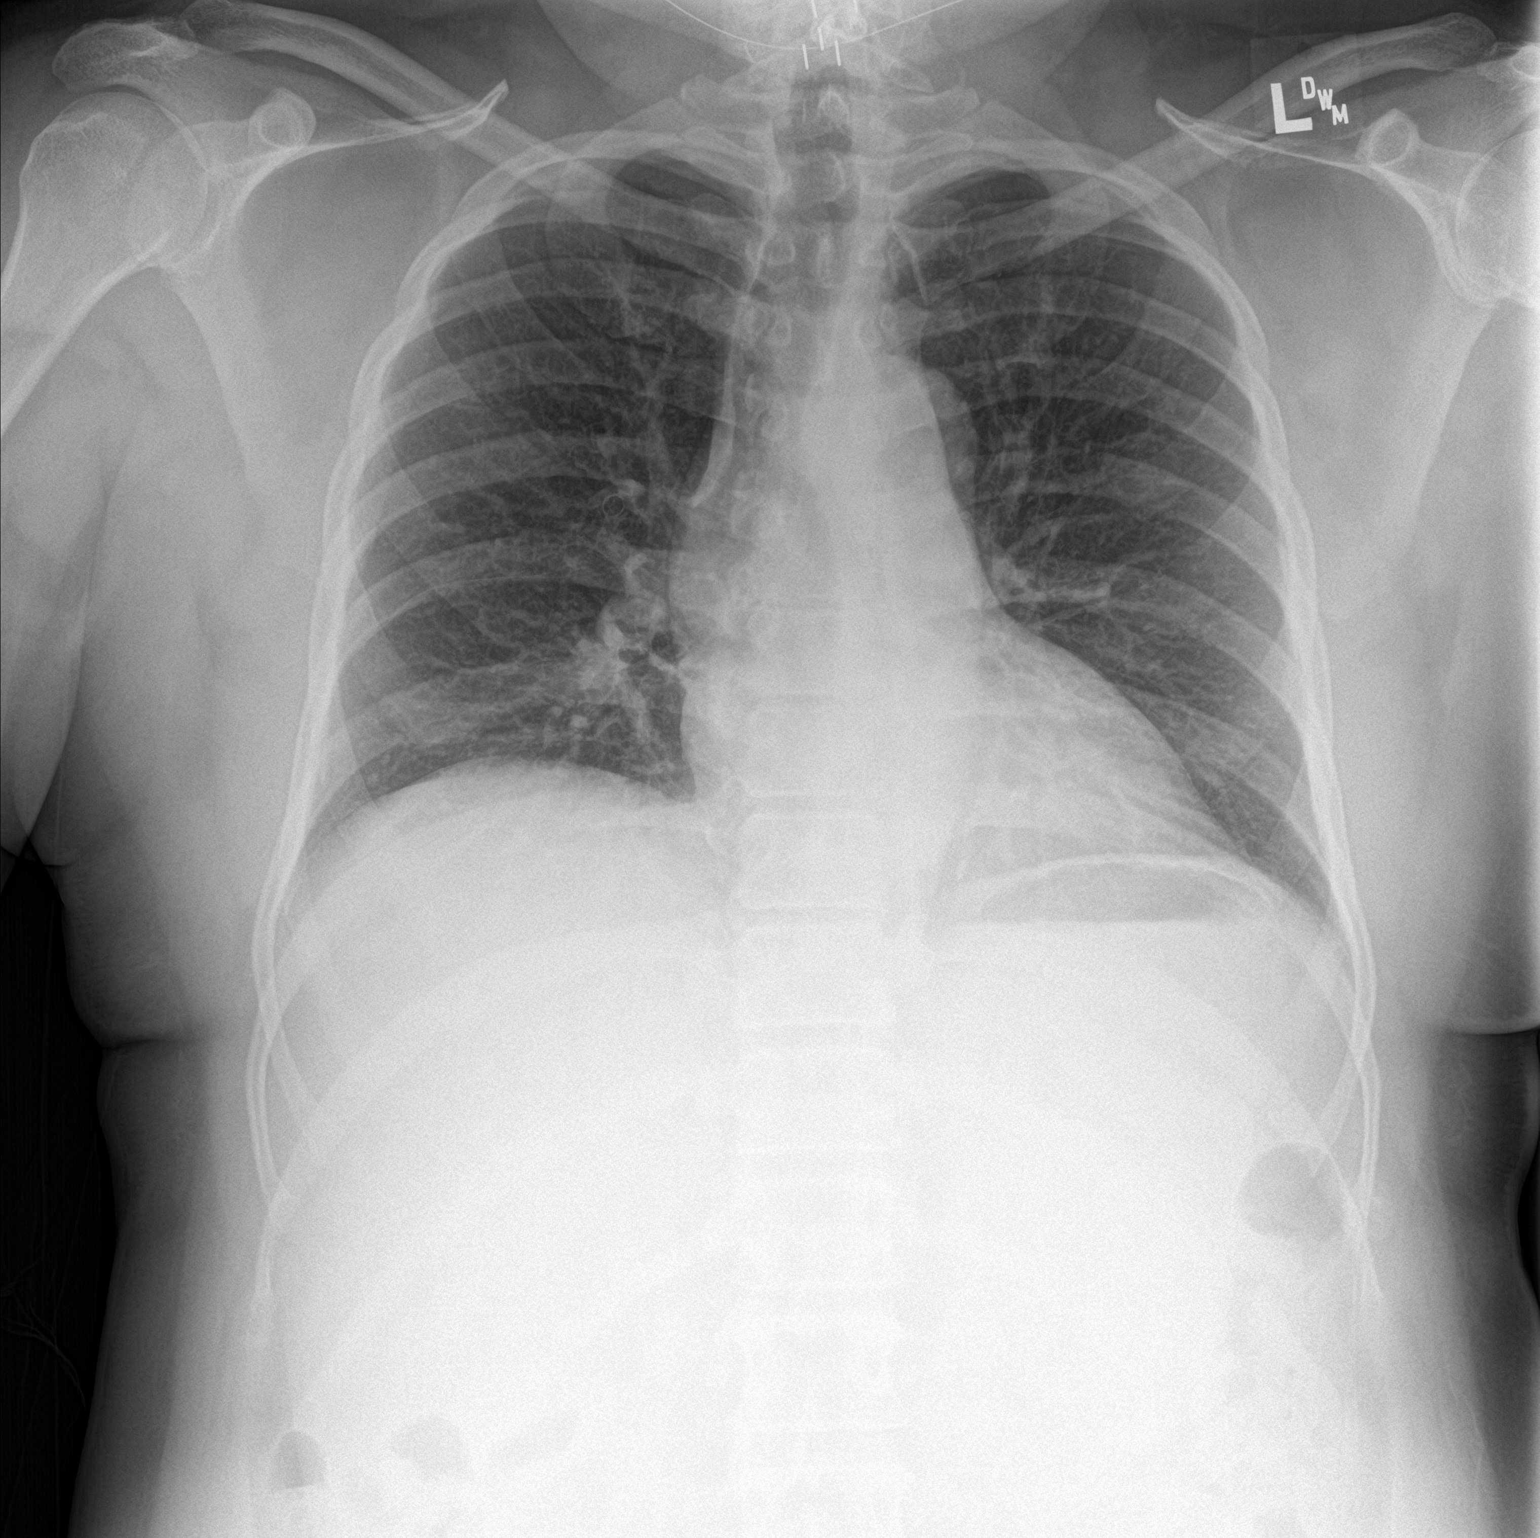

[2 of 2 positions shown; findings below may reference images not displayed]

FINDINGS: The heart size and mediastinal contours are within normal limits.
Both lungs are clear. Postsurgical changes in the lower cervical
region.
IMPRESSION: No active cardiopulmonary disease.

## 2020-04-21 ENCOUNTER — Encounter (HOSPITAL_COMMUNITY): Payer: Self-pay

## 2020-04-21 ENCOUNTER — Other Ambulatory Visit: Payer: Self-pay

## 2020-04-21 ENCOUNTER — Emergency Department (HOSPITAL_COMMUNITY)
Admission: EM | Admit: 2020-04-21 | Discharge: 2020-04-21 | Disposition: A | Discharge status: Home / Self Care | Payer: Medicare Other | Attending: Emergency Medicine | Admitting: Emergency Medicine

## 2020-04-21 DIAGNOSIS — Z87891 Personal history of nicotine dependence: Secondary | ICD-10-CM | POA: Insufficient documentation

## 2020-04-21 DIAGNOSIS — E1122 Type 2 diabetes mellitus with diabetic chronic kidney disease: Secondary | ICD-10-CM | POA: Diagnosis not present

## 2020-04-21 DIAGNOSIS — Z794 Long term (current) use of insulin: Secondary | ICD-10-CM | POA: Diagnosis not present

## 2020-04-21 DIAGNOSIS — Z79899 Other long term (current) drug therapy: Secondary | ICD-10-CM | POA: Diagnosis not present

## 2020-04-21 DIAGNOSIS — Z4889 Encounter for other specified surgical aftercare: Secondary | ICD-10-CM | POA: Insufficient documentation

## 2020-04-21 DIAGNOSIS — N182 Chronic kidney disease, stage 2 (mild): Secondary | ICD-10-CM | POA: Insufficient documentation

## 2020-04-21 DIAGNOSIS — J45909 Unspecified asthma, uncomplicated: Secondary | ICD-10-CM | POA: Diagnosis not present

## 2020-04-21 DIAGNOSIS — I129 Hypertensive chronic kidney disease with stage 1 through stage 4 chronic kidney disease, or unspecified chronic kidney disease: Secondary | ICD-10-CM | POA: Diagnosis not present

## 2020-04-21 NOTE — ED Triage Notes (Signed)
Patient reports that he had abdominal surgery in march. The top part of the incision has not completely healed. Patient states he had a small amount of dark blood from the area. Patient  States the area is very tender to touch. Patient states his post op incision was infected prior to leaving the hospital and states he has been on Doxycycline prior to his surgery in March

## 2020-04-21 NOTE — Discharge Instructions (Addendum)
Apply Neosporin ointment to the area once a day.  Call your surgeon and schedule a follow-up visit.  Return here for fever or chills

## 2020-04-21 NOTE — ED Provider Notes (Signed)
[facility]-EMERGENCY DEPT Provider Note   CSN: [id number] Arrival date & time: 04/21/20  1009     History Chief Complaint  Patient presents with  . post op issue    Nicholas [name] is a 59 y.o. male.  59 year old male presents with concern for possible wound infection.  Patient had a laparotomy incision that was performed several months ago and does have some keloid formation.  At the epigastric portion of this he noticed some breakdown.  Denies any fever or chills.  Noted some drainage that was not foul-smelling.  No abdominal discomfort at this time.  Has used local wound care.        Past Medical History:  Diagnosis Date  . Asthma   . Bipolar 1 disorder (HCC)   . Borderline glaucoma   . Chronic pain   . Epididymal pain    LEFT  . 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 Feeling of incomplete bladder emptying   . Frequency of urination   . Gastric ulcer   . GERD (gastroesophageal reflux disease)   . Hypertension   . Hyperthyroidism    NO MEDS   . Migraine   . Seizures (HCC)   . TIA (transient ischemic attack)   . Type 2 diabetes mellitus [facility])     Patient Active Problem List   Diagnosis Date Noted  . Essential hypertension   . Seizure disorder (HCC) 07/02/2017  . Insulin-requiring or dependent type II diabetes mellitus (HCC) 07/02/2017  . CKD (chronic kidney disease), stage II 07/02/2017  . Normocytic anemia 07/02/2017  . Testicular/scrotal pain 11/09/2013  . Microhematuria 11/09/2013  . Condyloma acuminatum of scrotum 11/09/2013    Past Surgical History:  Procedure Laterality Date  . ABDOMINAL SURGERY    . ANTERIOR CERVICAL DECOMP/DISCECTOMY FUSION  2007   C4  --  C6  . CERVICAL FUSION    . CHOLECYSTECTOMY    . CYSTOSCOPY N/A 11/09/2013   Procedure: CYSTOSCOPY FLEXIBLE;  Surgeon: Bjorn Pippin, MD;  Location: [facility];   Service: Urology;  Laterality: N/A;  . EPIDIDYMECTOMY Left 11/09/2013   Procedure:  LEFT EPIDIDYMECTOMY;  Surgeon: Bjorn Pippin, MD;  Location: [facility];  Service: Urology;  Laterality: Left;  POSSIBLE OUTPATIENT WITH OBSERVATION  . EXCISION LIPOMA LEFT SHOULDER  2004 (APPROX)  . MULTIPLE CYST REMOVED FROM CHEST  AGE 17  . OTHER SURGICAL HISTORY     hemorroid surgery   . TESTICLE REMOVAL Left   . TONSILLECTOMY         Family History  Problem Relation Age of Onset  . Heart failure Mother   . Diabetes Mother   . Hypertension Mother   . Cirrhosis Father   . Heart failure Father   . Kidney disease Father     Social History   Tobacco Use  . Smoking status: Former Smoker    Packs/day: 0.25    Years: 15.00    Pack years: 3.75    Types: Cigarettes    Quit date: 11/08/1992    Years since quitting: 27.4  . Smokeless tobacco: Never Used  Substance Use Topics  . Alcohol use: No  . Drug use: No    Home Medications Prior to Admission medications   Medication Sig Start Date End Date Taking? Authorizing Provider  acetaminophen (TYLENOL 8 HOUR) 650 MG CR tablet  Take 1 tablet (650 mg total) by mouth every 8 (eight) hours. 05/09/19   Derwood Kaplan, MD  acetaminophen-codeine 120-12 MG/5ML solution Take 10 mLs by mouth every 4 (four) hours as needed for moderate pain. 11/25/18   Lawyer, Cristal Deer, PA-C  albuterol (PROVENTIL HFA;VENTOLIN HFA) 108 (90 Base) MCG/ACT inhaler Inhale 1-2 puffs into the lungs every 6 (six) hours as needed for wheezing or shortness of breath. 11/15/18   Rancour, Jeannett Senior, MD  amitriptyline (ELAVIL) 25 MG tablet Take 25 mg by mouth at bedtime.    [provider]  atorvastatin (LIPITOR) 80 MG tablet Take 80 mg by mouth daily.    [provider]  azithromycin (ZITHROMAX) 250 MG tablet Take 1 tablet (250 mg total) by mouth daily. 03/31/18   Melene Plan, DO  benzonatate (TESSALON) 100 MG capsule Take 1 capsule (100 mg total) by mouth  every 8 (eight) hours. 10/28/18   Long, Arlyss Repress, MD  carisoprodol (SOMA) 350 MG tablet Take 1 tablet (350 mg total) 3 (three) times daily as needed by mouth for muscle spasms. 10/13/17   Loren Racer, MD  clindamycin (CLEOCIN) 300 MG capsule Take 1 capsule (300 mg total) by mouth 3 (three) times daily. 07/03/18   Raeford Razor, MD  clopidogrel (PLAVIX) 75 MG tablet Take 1 tablet (75 mg total) by mouth daily. 05/09/19   Derwood Kaplan, MD  clotrimazole (LOTRIMIN) 1 % cream Apply to affected area 2 times daily until it is resolved. 11/26/17   Alvira Monday, MD  doxycycline (VIBRAMYCIN) 100 MG capsule Take 1 capsule (100 mg total) by mouth 2 (two) times daily. 11/15/18   Rancour, Jeannett Senior, MD  fluticasone (FLONASE) 50 MCG/ACT nasal spray Place 2 sprays into both nostrils daily for 7 days. 10/28/18 11/04/18  Maia Plan, MD  fluticasone-salmeterol (ADVAIR HFA) 465-68 MCG/ACT inhaler Inhale 2 puffs into the lungs daily.    [provider]  gabapentin (NEURONTIN) 300 MG capsule Take 300 mg by mouth 2 (two) times daily.     [provider]  Guaifenesin 1200 MG TB12 Take 1 tablet (1,200 mg total) by mouth 2 (two) times daily. 11/25/18   Lawyer, Cristal Deer, PA-C  insulin glargine (LANTUS) 100 UNIT/ML injection Inject 45 Units into the skin at bedtime.     [provider]  insulin lispro (HUMALOG) 100 UNIT/ML injection Inject 6 Units into the skin 3 (three) times daily before meals. Sliding scale    [provider]  insulin regular (NOVOLIN R,HUMULIN R) 100 units/mL injection Inject 50 Units into the skin 4 (four) times daily -  before meals and at bedtime.    [provider]  levETIRAcetam (KEPPRA) 250 MG tablet Take 1 tablet (250 mg total) by mouth 2 (two) times daily. 07/29/18   Charlynne Pander, MD  levofloxacin (LEVAQUIN) 750 MG tablet Take 1 tablet (750 mg total) by mouth daily. 11/25/18   Lawyer, Cristal Deer, PA-C  lidocaine (LIDODERM) 5 % Place 1  patch onto the skin daily. Remove & Discard patch within 12 hours or as directed by MD 05/16/18   Petrucelli, Lelon Mast R, PA-C  lidocaine (XYLOCAINE) 2 % solution Use as directed 15 mLs in the mouth or throat as needed for mouth pain. 03/26/18   Rancour, Jeannett Senior, MD  losartan (COZAAR) 50 MG tablet Take 50 mg by mouth daily.    [provider]  metFORMIN (GLUCOPHAGE) 500 MG tablet Take 1,000 mg by mouth 2 (two) times daily with a meal.     [provider]  methocarbamol (ROBAXIN) 500 MG tablet Take 1 tablet (500 mg total) by mouth 2 (two) times daily. 05/09/19   Varney Biles, MD  nortriptyline (PAMELOR) 50 MG capsule Take 50 mg at bedtime by mouth.    [provider]  promethazine (PHENERGAN) 25 MG tablet Take 1 tablet (25 mg total) by mouth every 6 (six) hours as needed for nausea or vomiting. 05/04/17   Fredia Sorrow, MD  promethazine-dextromethorphan (PROMETHAZINE-DM) 6.25-15 MG/5ML syrup Take 5 mLs by mouth 4 (four) times daily as needed for cough. 08/06/17   Tanna Furry, MD  rizatriptan (MAXALT) 10 MG tablet Take 10 mg by mouth as needed for migraine. May repeat in 2 hours if needed    [provider]  sucralfate (CARAFATE) 1 GM/10ML suspension Take 10 mLs (1 g total) by mouth 4 (four) times daily -  with meals and at bedtime. 06/09/17   Palumbo, April, MD  VALSARTAN PO Take 325 mg by mouth daily.    [provider]    Allergies    Nsaids, Tramadol, Amoxicillin, Ampicillin, Dilantin [phenytoin], Penicillins, Risperidone and related, Strawberry extract, Bactrim [sulfamethoxazole-trimethoprim], Depakote [divalproex sodium], Ultram [tramadol hcl], and Oatmeal  Review of Systems   Review of Systems  All other systems reviewed and are negative.   Physical Exam Updated Vital Signs BP 108/74 (BP Location: Left Arm)   Pulse 82   Temp 97.8 F (36.6 C) (Oral)   Resp 16   Ht 1.702 m (5\' 7" )   Wt 82.8 kg   SpO2 97%   BMI 28.60 kg/m   Physical Exam  Vitals and nursing note reviewed.  Constitutional:      General: He is not in acute distress.    Appearance: Normal appearance. He is well-developed. He is not toxic-appearing.  HENT:     Head: Normocephalic and atraumatic.  Eyes:     General: Lids are normal.     Conjunctiva/sclera: Conjunctivae normal.     Pupils: Pupils are equal, round, and reactive to light.  Neck:     Thyroid: No thyroid mass.     Trachea: No tracheal deviation.  Cardiovascular:     Rate and Rhythm: Normal rate and regular rhythm.     Heart sounds: Normal heart sounds. No murmur. No gallop.   Pulmonary:     Effort: Pulmonary effort is normal. No respiratory distress.     Breath sounds: Normal breath sounds. No stridor. No decreased breath sounds, wheezing, rhonchi or rales.  Abdominal:     General: Bowel sounds are normal. There is no distension.     Palpations: Abdomen is soft.     Tenderness: There is no abdominal tenderness. There is no rebound.    Musculoskeletal:        General: No tenderness. Normal range of motion.     Cervical back: Normal range of motion and neck supple.  Skin:    General: Skin is warm and dry.     Findings: No abrasion or rash.  Neurological:     Mental Status: He is alert and oriented to person, place, and time.     GCS: GCS eye subscore is 4. GCS verbal subscore is 5. GCS motor subscore is 6.     Cranial Nerves: No cranial nerve deficit.     Sensory: No sensory deficit.  Psychiatric:        Speech: Speech normal.        Behavior: Behavior normal.     ED Results / Procedures / Treatments  Labs (all labs ordered are listed, but only abnormal results are displayed) Labs Reviewed - No data to display  EKG None  Radiology No results found.  Procedures Procedures (including critical care time)  Medications Ordered in ED Medications - No data to display  ED Course  I have reviewed the triage vital signs and the nursing notes.  Pertinent labs & imaging results  that were available during my care of the patient were reviewed by me and considered in my medical decision making (see chart for details).    MDM Rules/Calculators/A&P                      Patient instructed on local wound care and topical antibiotics and bandage placed here by nursing.  Patient instructed to follow-up with his surgeon next week and return precautions given Final Clinical Impression(s) / ED Diagnoses Final diagnoses:  None    Rx / DC Orders ED Discharge Orders    None       Lorre Nick, MD 04/21/20 1253

## 2020-05-03 ENCOUNTER — Encounter (HOSPITAL_COMMUNITY): Payer: Self-pay

## 2020-05-03 ENCOUNTER — Other Ambulatory Visit: Payer: Self-pay

## 2020-05-03 ENCOUNTER — Emergency Department (HOSPITAL_COMMUNITY): Payer: Medicare Other

## 2020-05-03 ENCOUNTER — Emergency Department (HOSPITAL_COMMUNITY)
Admission: EM | Admit: 2020-05-03 | Discharge: 2020-05-03 | Disposition: A | Discharge status: Home / Self Care | Payer: Medicare Other | Attending: Emergency Medicine | Admitting: Emergency Medicine

## 2020-05-03 ENCOUNTER — Encounter (HOSPITAL_COMMUNITY): Payer: Self-pay | Admitting: Radiology

## 2020-05-03 DIAGNOSIS — G40909 Epilepsy, unspecified, not intractable, without status epilepticus: Secondary | ICD-10-CM | POA: Insufficient documentation

## 2020-05-03 DIAGNOSIS — Z20822 Contact with and (suspected) exposure to covid-19: Secondary | ICD-10-CM | POA: Insufficient documentation

## 2020-05-03 DIAGNOSIS — G8929 Other chronic pain: Secondary | ICD-10-CM | POA: Insufficient documentation

## 2020-05-03 DIAGNOSIS — Z79899 Other long term (current) drug therapy: Secondary | ICD-10-CM | POA: Diagnosis not present

## 2020-05-03 DIAGNOSIS — N289 Disorder of kidney and ureter, unspecified: Secondary | ICD-10-CM | POA: Diagnosis not present

## 2020-05-03 DIAGNOSIS — N179 Acute kidney failure, unspecified: Secondary | ICD-10-CM | POA: Diagnosis not present

## 2020-05-03 DIAGNOSIS — F449 Dissociative and conversion disorder, unspecified: Secondary | ICD-10-CM | POA: Insufficient documentation

## 2020-05-03 DIAGNOSIS — G8191 Hemiplegia, unspecified affecting right dominant side: Secondary | ICD-10-CM | POA: Insufficient documentation

## 2020-05-03 DIAGNOSIS — R4781 Slurred speech: Secondary | ICD-10-CM | POA: Diagnosis present

## 2020-05-03 LAB — URINALYSIS, ROUTINE W REFLEX MICROSCOPIC
Bilirubin Urine: NEGATIVE
Glucose, UA: 50 mg/dL — AB
Hgb urine dipstick: NEGATIVE
Ketones, ur: NEGATIVE mg/dL
Leukocytes,Ua: NEGATIVE
Nitrite: NEGATIVE
Protein, ur: NEGATIVE mg/dL
Specific Gravity, Urine: 1.021 (ref 1.005–1.030)
pH: 5 (ref 5.0–8.0)

## 2020-05-03 LAB — RAPID URINE DRUG SCREEN, HOSP PERFORMED
Amphetamines: NOT DETECTED
Barbiturates: NOT DETECTED
Benzodiazepines: NOT DETECTED
Cocaine: NOT DETECTED
Opiates: NOT DETECTED
Tetrahydrocannabinol: NOT DETECTED

## 2020-05-03 LAB — DIFFERENTIAL
Abs Immature Granulocytes: 0.01 10*3/uL (ref 0.00–0.07)
Basophils Absolute: 0 10*3/uL (ref 0.0–0.1)
Basophils Relative: 0 %
Eosinophils Absolute: 0.1 10*3/uL (ref 0.0–0.5)
Eosinophils Relative: 1 %
Immature Granulocytes: 0 %
Lymphocytes Relative: 16 %
Lymphs Abs: 1.3 10*3/uL (ref 0.7–4.0)
Monocytes Absolute: 0.6 10*3/uL (ref 0.1–1.0)
Monocytes Relative: 7 %
Neutro Abs: 6.2 10*3/uL (ref 1.7–7.7)
Neutrophils Relative %: 76 %

## 2020-05-03 LAB — I-STAT CHEM 8, ED
BUN: 27 mg/dL — ABNORMAL HIGH (ref 6–20)
BUN: 27 mg/dL — ABNORMAL HIGH (ref 6–20)
Calcium, Ion: 1.24 mmol/L (ref 1.15–1.40)
Calcium, Ion: 1.21 mmol/L (ref 1.15–1.40)
Chloride: 109 mmol/L (ref 98–111)
Chloride: 105 mmol/L (ref 98–111)
Creatinine, Ser: 1.9 mg/dL — ABNORMAL HIGH (ref 0.61–1.24)
Creatinine, Ser: 1.7 mg/dL — ABNORMAL HIGH (ref 0.61–1.24)
Glucose, Bld: 109 mg/dL — ABNORMAL HIGH (ref 70–99)
Glucose, Bld: 94 mg/dL (ref 70–99)
HCT: 38 % — ABNORMAL LOW (ref 39.0–52.0)
HCT: 35 % — ABNORMAL LOW (ref 39.0–52.0)
Hemoglobin: 12.9 g/dL — ABNORMAL LOW (ref 13.0–17.0)
Hemoglobin: 11.9 g/dL — ABNORMAL LOW (ref 13.0–17.0)
Potassium: 4.7 mmol/L (ref 3.5–5.1)
Potassium: 5 mmol/L (ref 3.5–5.1)
Sodium: 140 mmol/L (ref 135–145)
Sodium: 137 mmol/L (ref 135–145)
TCO2: 22 mmol/L (ref 22–32)
TCO2: 22 mmol/L (ref 22–32)

## 2020-05-03 LAB — CBC
HCT: 38.7 % — ABNORMAL LOW (ref 39.0–52.0)
Hemoglobin: 12.6 g/dL — ABNORMAL LOW (ref 13.0–17.0)
MCH: 27 pg (ref 26.0–34.0)
MCHC: 32.6 g/dL (ref 30.0–36.0)
MCV: 82.9 fL (ref 80.0–100.0)
Platelets: 158 10*3/uL (ref 150–400)
RBC: 4.67 MIL/uL (ref 4.22–5.81)
RDW: 15.7 % — ABNORMAL HIGH (ref 11.5–15.5)
WBC: 8.2 10*3/uL (ref 4.0–10.5)
nRBC: 0 % (ref 0.0–0.2)

## 2020-05-03 LAB — COMPREHENSIVE METABOLIC PANEL
ALT: 31 U/L (ref 0–44)
AST: 22 U/L (ref 15–41)
Albumin: 4.2 g/dL (ref 3.5–5.0)
Alkaline Phosphatase: 53 U/L (ref 38–126)
Anion gap: 10 (ref 5–15)
BUN: 27 mg/dL — ABNORMAL HIGH (ref 6–20)
CO2: 21 mmol/L — ABNORMAL LOW (ref 22–32)
Calcium: 9.5 mg/dL (ref 8.9–10.3)
Chloride: 105 mmol/L (ref 98–111)
Creatinine, Ser: 1.77 mg/dL — ABNORMAL HIGH (ref 0.61–1.24)
GFR calc Af Amer: 48 mL/min — ABNORMAL LOW (ref >60)
GFR calc non Af Amer: 41 mL/min — ABNORMAL LOW (ref >60)
Glucose, Bld: 115 mg/dL — ABNORMAL HIGH (ref 70–99)
Potassium: 4.8 mmol/L (ref 3.5–5.1)
Sodium: 136 mmol/L (ref 135–145)
Total Bilirubin: 0.5 mg/dL (ref 0.3–1.2)
Total Protein: 7 g/dL (ref 6.5–8.1)

## 2020-05-03 LAB — SARS CORONAVIRUS 2 BY RT PCR (HOSPITAL ORDER, PERFORMED IN ~~LOC~~ HOSPITAL LAB): SARS Coronavirus 2: NEGATIVE

## 2020-05-03 LAB — CBG MONITORING, ED: Glucose-Capillary: 105 mg/dL — ABNORMAL HIGH (ref 70–99)

## 2020-05-03 LAB — PROTIME-INR
INR: 1.2 (ref 0.8–1.2)
Prothrombin Time: 14.8 seconds (ref 11.4–15.2)

## 2020-05-03 LAB — ETHANOL: Alcohol, Ethyl (B): <10 mg/dL (ref <10)

## 2020-05-03 LAB — APTT: aPTT: 33 seconds (ref 24–36)

## 2020-05-03 LAB — SALICYLATE LEVEL: Salicylate Lvl: <7 mg/dL — ABNORMAL LOW (ref 7.0–30.0)

## 2020-05-03 LAB — ACETAMINOPHEN LEVEL: Acetaminophen (Tylenol), Serum: <10 ug/mL — ABNORMAL LOW (ref 10–30)

## 2020-05-03 MED ORDER — IOHEXOL 350 MG/ML SOLN
100.0000 mL | Freq: Once | INTRAVENOUS | Status: AC | PRN
  Administered 2020-05-03: 100 mL via INTRAVENOUS

## 2020-05-03 MED ORDER — SODIUM CHLORIDE 0.9 % IV BOLUS
500.0000 mL | Freq: Once | INTRAVENOUS | Status: AC
  Administered 2020-05-03: 500 mL via INTRAVENOUS

## 2020-05-03 NOTE — ED Triage Notes (Signed)
Patient is from a hotel, having right sided paralysis, facial droop and slurred speech.  Patient states that he was normal before going to bed and woke up around midnight feeling this way.  Patient is CAOx4, denies any pain at this time.  CBG of 129.  Patient has been off his plavix for the last three months.

## 2020-05-03 NOTE — Consult Note (Signed)
Neurology Consultation Reason for Consult: Right-sided weakness Referring Physician: Palombo, a  CC: Right-sided weakness  History is obtained from: Patient  HPI: Nicholas [name] is a 59 y.o. male with a history of seizures, diabetes, bipolar disorder, migraine who presents with right-sided weakness and slurred speech.  He apparently went to bed around 10 PM and woke up this way.  EMS activated code stroke.  On arrival he was taken for CT/CTA/CTP all of which were negative.  He states that he has had a stroke previously.  His symptoms are inconsistent.  He also had an episode of convulsion, but was responsive and would hold his arm in what ever position it was holding during the convulsion.  He had no postictal state.  LKW: 10 PM tpa given?: no, not a stroke    ROS: A 14 point ROS was performed and is negative except as noted in the HPI.   Past Medical History:  Diagnosis Date  . Asthma   . Bipolar 1 disorder (HCC)   . Borderline glaucoma   . Chronic pain   . Epididymal pain    LEFT  . 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 Feeling of incomplete bladder emptying   . Frequency of urination   . Gastric ulcer   . GERD (gastroesophageal reflux disease)   . Hypertension   . Hyperthyroidism    NO MEDS   . Migraine   . Seizures (HCC)   . TIA (transient ischemic attack)   . Type 2 diabetes mellitus (HCC)      Family History  Problem Relation Age of Onset  . Heart failure Mother   . Diabetes Mother   . Hypertension Mother   . Cirrhosis Father   . Heart failure Father   . Kidney disease Father      Social History:  reports that he quit smoking about 27 years ago. His smoking use included cigarettes. He has a 3.75 pack-year smoking history. He has never used smokeless tobacco. He reports that he does not drink alcohol or use drugs.   Exam: Current vital signs: BP 110/72   Pulse  86   Resp (!) 24   SpO2 97%  Vital signs in last 24 hours: Pulse Rate:  [86] 86 (05/28 0245) Resp:  [24] 24 (05/28 0245) BP: (110)/(72) 110/72 (05/28 0245) SpO2:  [97 %] 97 % (05/28 0245)   Physical Exam  Constitutional: Appears well-developed and well-nourished.  Psych: Affect appropriate to situation Eyes: No scleral injection HENT: No OP obstrucion MSK: no joint deformities.  Cardiovascular: Normal rate and regular rhythm.  Respiratory: Effort normal, non-labored breathing GI: Soft.  No distension. There is no tenderness.  Skin: WDI  Neuro: Mental Status: He has a nonorganic sounding speech pattern, which waxes and wanes depending on how badly he wants to answer the question. Cranial Nerves: II: Visual Fields are full, in the left eye, he states that he cannot see at home or out of his right eye, however OKN strips elicit nystagmus. Pupils are equal, round, and reactive to light.   III,IV, VI: EOMI without ptosis or diploplia.  V: Facial sensation is diminished on the right VII: He appears to not know exactly what to do when trying to smile, initially holding his right side of his face and then his left side of his face still.  When asking to puff  out his cheeks, he does not puff either side. VIII: hearing is intact to voice X: Uvula elevates symmetrically XI: Shoulder shrug is symmetric. XII: tongue is midline without atrophy or fasciculations.  Motor: He gives no voluntary movement on the right, however when I distract him and lift his right knee he is able to hold his right ankle off the bed against gravity, when I hold his right arm over his face he reliably avoids his face as it falls. Cerebellar: No clear ataxia on the left   I have reviewed labs in epic and the results pertinent to this consultation are: Creatinine 1.77  I have reviewed the images obtained: CTA/CTP-negative  Impression: 59 year old male with right-sided weakness and dysarthria not consistent with  organic disease.  The convulsion that was witnessed is also consistent with epileptic seizure.  At this time, no further neurodiagnostic testing is needed, he is improving.  Recommendations: 1) initially recommended CT A/P, this is finalized at the time of this dictation 2) no further neuro evaluation needed, could continue home keppra.    Roland Rack, MD [facility] [phone]  If 7pm- 7am, please page neurology on call as listed in [place].

## 2020-05-03 NOTE — ED Provider Notes (Signed)
[name] EMERGENCY DEPARTMENT Provider Note   CSN: [id number] Arrival date & time: 05/03/20  0151     History Chief Complaint  Patient presents with  . Code Stroke    Nicholas [name] is a 59 y.o. male.  The history is provided by the EMS personnel. The history is limited by the condition of the patient.  Patient with a h/o Bipolar presents with reported paralysis on the right side and speech abnormality.  Unknown LSN.  No f/c/r.  No chest pain.  No shortness of breath.  No headache.       History reviewed. No pertinent past medical history.  There are no problems to display for this patient.   History reviewed. No pertinent surgical history.     History reviewed. No pertinent family history.  Social History   Tobacco Use  . Smoking status: Not on file  Substance Use Topics  . Alcohol use: Not on file  . Drug use: Not on file    Home Medications Prior to Admission medications   Not on File    Allergies    Patient has no known allergies.  Review of Systems   Review of Systems  Unable to perform ROS: Acuity of condition  Constitutional: Negative for fever.  HENT: Negative for congestion.   Eyes: Negative for visual disturbance.  Respiratory: Negative for shortness of breath.   Cardiovascular: Negative for chest pain.  Gastrointestinal: Negative for abdominal pain.  Genitourinary: Negative for difficulty urinating.  Musculoskeletal: Negative for arthralgias.  Skin: Negative for pallor.  Neurological: Negative for dizziness.  Psychiatric/Behavioral: Negative for agitation.  All other systems reviewed and are negative.   Physical Exam Updated Vital Signs BP 103/74   Pulse 85   Resp (!) 21   SpO2 96%   Physical Exam Vitals and nursing note reviewed.  Constitutional:      General: He is not in acute distress.    Appearance: Normal appearance.  HENT:     Head: Normocephalic and atraumatic.     Nose: Nose normal.   Mouth/Throat:     Mouth: Mucous membranes are moist.     Pharynx: Oropharynx is clear.  Eyes:     Extraocular Movements: Extraocular movements intact.     Conjunctiva/sclera: Conjunctivae normal.     Pupils: Pupils are equal, round, and reactive to light.  Cardiovascular:     Rate and Rhythm: Normal rate and regular rhythm.     Pulses: Normal pulses.     Heart sounds: Normal heart sounds.  Pulmonary:     Effort: Pulmonary effort is normal.     Breath sounds: Normal breath sounds.  Abdominal:     General: Abdomen is flat. Bowel sounds are normal.     Tenderness: There is no abdominal tenderness. There is no guarding or rebound.  Musculoskeletal:        General: Normal range of motion.  Skin:    General: Skin is warm and dry.     Capillary Refill: Capillary refill takes less than 2 seconds.  Neurological:     General: No focal deficit present.     Mental Status: He is alert and oriented to person, place, and time.     Deep Tendon Reflexes: Reflexes normal.  Psychiatric:        Mood and Affect: Mood normal.        Behavior: Behavior normal.     ED Results / Procedures / Treatments   Labs (all labs ordered are listed,  but only abnormal results are displayed) Labs Reviewed  CBC - Abnormal; Notable for the following components:      Result Value   Hemoglobin 12.6 (*)    HCT 38.7 (*)    RDW 15.7 (*)    All other components within normal limits  COMPREHENSIVE METABOLIC PANEL - Abnormal; Notable for the following components:   CO2 21 (*)    Glucose, Bld 115 (*)    BUN 27 (*)    Creatinine, Ser 1.77 (*)    GFR calc non Af Amer 41 (*)    GFR calc Af Amer 48 (*)    All other components within normal limits  URINALYSIS, ROUTINE W REFLEX MICROSCOPIC - Abnormal; Notable for the following components:   Glucose, UA 50 (*)    All other components within normal limits  I-STAT CHEM 8, ED - Abnormal; Notable for the following components:   BUN 27 (*)    Creatinine, Ser 1.90 (*)     Glucose, Bld 109 (*)    Hemoglobin 12.9 (*)    HCT 38.0 (*)    All other components within normal limits  CBG MONITORING, ED - Abnormal; Notable for the following components:   Glucose-Capillary 105 (*)    All other components within normal limits  SARS CORONAVIRUS 2 BY RT PCR (HOSPITAL ORDER, PERFORMED IN [facility])  ETHANOL  PROTIME-INR  APTT  DIFFERENTIAL  RAPID URINE DRUG SCREEN, HOSP PERFORMED  ACETAMINOPHEN LEVEL  SALICYLATE LEVEL    EKG None  Radiology CT Code Stroke CTA Head W/WO contrast  Result Date: 05/03/2020 CLINICAL DATA:  Right-sided weakness with facial droop and slurred speech EXAM: CT ANGIOGRAPHY HEAD AND NECK CT PERFUSION BRAIN TECHNIQUE: Multidetector CT imaging of the head and neck was performed using the standard protocol during bolus administration of intravenous contrast. Multiplanar CT image reconstructions and MIPs were obtained to evaluate the vascular anatomy. Carotid stenosis measurements (when applicable) are obtained utilizing NASCET criteria, using the distal internal carotid diameter as the denominator. Multiphase CT imaging of the brain was performed following IV bolus contrast injection. Subsequent parametric perfusion maps were calculated using RAPID software. CONTRAST:  100mL OMNIPAQUE IOHEXOL 350 MG/ML SOLN COMPARISON:  Head CT 05/03/2020 FINDINGS: CTA NECK FINDINGS SKELETON: There is no bony spinal canal stenosis. No lytic or blastic lesion. OTHER NECK: Normal pharynx, larynx and major salivary glands. No cervical lymphadenopathy. Unremarkable thyroid gland. UPPER CHEST: No pneumothorax or pleural effusion. No nodules or masses. AORTIC ARCH: There is no calcific atherosclerosis of the aortic arch. There is no aneurysm, dissection or hemodynamically significant stenosis of the visualized portion of the aorta. Conventional 3 vessel aortic branching pattern. The visualized proximal subclavian arteries are widely patent. RIGHT CAROTID SYSTEM:  Normal without aneurysm, dissection or stenosis. LEFT CAROTID SYSTEM: Normal without aneurysm, dissection or stenosis. VERTEBRAL ARTERIES: Left dominant configuration. Both origins are clearly patent. There is no dissection, occlusion or flow-limiting stenosis to the skull base (V1-V3 segments). CTA HEAD FINDINGS POSTERIOR CIRCULATION: --Vertebral arteries: Normal V4 segments. --Inferior cerebellar arteries: Normal. --Basilar artery: Normal. --Superior cerebellar arteries: Normal. --Posterior cerebral arteries (PCA): Normal. ANTERIOR CIRCULATION: --Intracranial internal carotid arteries: Normal. --Anterior cerebral arteries (ACA): Normal. Both A1 segments are present. Patent anterior communicating artery (a-comm). --Middle cerebral arteries (MCA): Normal. VENOUS SINUSES: As permitted by contrast timing, patent. ANATOMIC VARIANTS: None Review of the MIP images confirms the above findings. CT Brain Perfusion Findings: ASPECTS: 10 CBF (<30%) Volume: 0mL Perfusion (Tmax>6.0s) volume: 0mL Mismatch Volume: 0mL Infarction  Location:None IMPRESSION: 1. Normal CTA of the head and neck. 2. Normal CT perfusion. Electronically Signed   By: Ulyses Jarred M.D.   On: 05/03/2020 02:30   CT Code Stroke CTA Neck W/WO contrast  Result Date: 05/03/2020 CLINICAL DATA:  Right-sided weakness with facial droop and slurred speech EXAM: CT ANGIOGRAPHY HEAD AND NECK CT PERFUSION BRAIN TECHNIQUE: Multidetector CT imaging of the head and neck was performed using the standard protocol during bolus administration of intravenous contrast. Multiplanar CT image reconstructions and MIPs were obtained to evaluate the vascular anatomy. Carotid stenosis measurements (when applicable) are obtained utilizing NASCET criteria, using the distal internal carotid diameter as the denominator. Multiphase CT imaging of the brain was performed following IV bolus contrast injection. Subsequent parametric perfusion maps were calculated using RAPID software.  CONTRAST:  122mL OMNIPAQUE IOHEXOL 350 MG/ML SOLN COMPARISON:  Head CT 05/03/2020 FINDINGS: CTA NECK FINDINGS SKELETON: There is no bony spinal canal stenosis. No lytic or blastic lesion. OTHER NECK: Normal pharynx, larynx and major salivary glands. No cervical lymphadenopathy. Unremarkable thyroid gland. UPPER CHEST: No pneumothorax or pleural effusion. No nodules or masses. AORTIC ARCH: There is no calcific atherosclerosis of the aortic arch. There is no aneurysm, dissection or hemodynamically significant stenosis of the visualized portion of the aorta. Conventional 3 vessel aortic branching pattern. The visualized proximal subclavian arteries are widely patent. RIGHT CAROTID SYSTEM: Normal without aneurysm, dissection or stenosis. LEFT CAROTID SYSTEM: Normal without aneurysm, dissection or stenosis. VERTEBRAL ARTERIES: Left dominant configuration. Both origins are clearly patent. There is no dissection, occlusion or flow-limiting stenosis to the skull base (V1-V3 segments). CTA HEAD FINDINGS POSTERIOR CIRCULATION: --Vertebral arteries: Normal V4 segments. --Inferior cerebellar arteries: Normal. --Basilar artery: Normal. --Superior cerebellar arteries: Normal. --Posterior cerebral arteries (PCA): Normal. ANTERIOR CIRCULATION: --Intracranial internal carotid arteries: Normal. --Anterior cerebral arteries (ACA): Normal. Both A1 segments are present. Patent anterior communicating artery (a-comm). --Middle cerebral arteries (MCA): Normal. VENOUS SINUSES: As permitted by contrast timing, patent. ANATOMIC VARIANTS: None Review of the MIP images confirms the above findings. CT Brain Perfusion Findings: ASPECTS: 10 CBF (<30%) Volume: 35mL Perfusion (Tmax>6.0s) volume: 9mL Mismatch Volume: 12mL Infarction Location:None IMPRESSION: 1. Normal CTA of the head and neck. 2. Normal CT perfusion. Electronically Signed   By: Ulyses Jarred M.D.   On: 05/03/2020 02:30   CT Code Stroke Cerebral Perfusion with contrast  Result Date:  05/03/2020 CLINICAL DATA:  Right-sided weakness with facial droop and slurred speech EXAM: CT ANGIOGRAPHY HEAD AND NECK CT PERFUSION BRAIN TECHNIQUE: Multidetector CT imaging of the head and neck was performed using the standard protocol during bolus administration of intravenous contrast. Multiplanar CT image reconstructions and MIPs were obtained to evaluate the vascular anatomy. Carotid stenosis measurements (when applicable) are obtained utilizing NASCET criteria, using the distal internal carotid diameter as the denominator. Multiphase CT imaging of the brain was performed following IV bolus contrast injection. Subsequent parametric perfusion maps were calculated using RAPID software. CONTRAST:  163mL OMNIPAQUE IOHEXOL 350 MG/ML SOLN COMPARISON:  Head CT 05/03/2020 FINDINGS: CTA NECK FINDINGS SKELETON: There is no bony spinal canal stenosis. No lytic or blastic lesion. OTHER NECK: Normal pharynx, larynx and major salivary glands. No cervical lymphadenopathy. Unremarkable thyroid gland. UPPER CHEST: No pneumothorax or pleural effusion. No nodules or masses. AORTIC ARCH: There is no calcific atherosclerosis of the aortic arch. There is no aneurysm, dissection or hemodynamically significant stenosis of the visualized portion of the aorta. Conventional 3 vessel aortic branching pattern. The visualized proximal subclavian arteries are widely  patent. RIGHT CAROTID SYSTEM: Normal without aneurysm, dissection or stenosis. LEFT CAROTID SYSTEM: Normal without aneurysm, dissection or stenosis. VERTEBRAL ARTERIES: Left dominant configuration. Both origins are clearly patent. There is no dissection, occlusion or flow-limiting stenosis to the skull base (V1-V3 segments). CTA HEAD FINDINGS POSTERIOR CIRCULATION: --Vertebral arteries: Normal V4 segments. --Inferior cerebellar arteries: Normal. --Basilar artery: Normal. --Superior cerebellar arteries: Normal. --Posterior cerebral arteries (PCA): Normal. ANTERIOR CIRCULATION:  --Intracranial internal carotid arteries: Normal. --Anterior cerebral arteries (ACA): Normal. Both A1 segments are present. Patent anterior communicating artery (a-comm). --Middle cerebral arteries (MCA): Normal. VENOUS SINUSES: As permitted by contrast timing, patent. ANATOMIC VARIANTS: None Review of the MIP images confirms the above findings. CT Brain Perfusion Findings: ASPECTS: 10 CBF (<30%) Volume: 48mL Perfusion (Tmax>6.0s) volume: 92mL Mismatch Volume: 72mL Infarction Location:None IMPRESSION: 1. Normal CTA of the head and neck. 2. Normal CT perfusion. Electronically Signed   By: Deatra Robinson M.D.   On: 05/03/2020 02:30   CT HEAD CODE STROKE WO CONTRAST  Result Date: 05/03/2020 CLINICAL DATA:  Code stroke.  Right-sided weakness with facial droop EXAM: CT HEAD WITHOUT CONTRAST TECHNIQUE: Contiguous axial images were obtained from the base of the skull through the vertex without intravenous contrast. COMPARISON:  None. FINDINGS: Brain: There is no mass, hemorrhage or extra-axial collection. The size and configuration of the ventricles and extra-axial CSF spaces are normal. The brain parenchyma is normal, without evidence of acute or chronic infarction. Vascular: No abnormal hyperdensity of the major intracranial arteries or dural venous sinuses. No intracranial atherosclerosis. Skull: The visualized skull base, calvarium and extracranial soft tissues are normal. Sinuses/Orbits: No fluid levels or advanced mucosal thickening of the visualized paranasal sinuses. No mastoid or middle ear effusion. The orbits are normal. ASPECTS [facility] Stroke Program Early CT Score) - Ganglionic level infarction (caudate, lentiform nuclei, internal capsule, insula, M1-M3 cortex): 7 - Supraganglionic infarction (M4-M6 cortex): 3 Total score (0-10 with 10 being normal): 10 IMPRESSION: 1. Normal head CT. 2. ASPECTS is 10. These results were communicated to Dr. Ritta Slot at 2:08 am on 05/03/2020 by text page via the [facility]  messaging system. Electronically Signed   By: Deatra Robinson M.D.   On: 05/03/2020 02:08    Procedures Procedures (including critical care time)  Medications Ordered in ED Medications  sodium chloride 0.9 % bolus 500 mL (has no administration in time range)  iohexol (OMNIPAQUE) 350 MG/ML injection 100 mL (100 mLs Intravenous Contrast Given 05/03/20 0208)    ED Course  I have reviewed the triage vital signs and the nursing notes.  Pertinent labs & imaging results that were available during my care of the patient were reviewed by me and considered in my medical decision making (see chart for details).    [place] Dr. Amada Jupiter who felt the symptoms to be psychogenic and not neurologic.  CTs confirm there is no stroke.  Symptoms disappeared when his mental health worker presented.  Is back to his baseline.  This ispsychiatric in natur.  No SI no HI.  Does not require consultation at this time.  Stable for discharge. Will need to follow up with PMD and [facility] regarding his renal insufficiency.  Hydrated in the ED and Creatinine improved but is not normal.   Final Clinical Impression(s) / ED Diagnoses Final diagnoses:  Renal insufficiency   Return for intractable cough, coughing up blood,fevers >100.4 unrelieved by medication, shortness of breath, intractable vomiting, chest pain, shortness of breath, weakness,numbness, changes in speech, facial asymmetry,abdominal  pain, passing out,Inability to tolerate liquids or food, cough, altered mental status or any concerns. No signs of systemic illness or infection. The patient is nontoxic-appearing on exam and vital signs are within normal limits.   I have reviewed the triage vital signs and the nursing notes. Pertinent labs &imaging results that were available during my care of the patient were reviewed by me and considered in my medical decision making (see chart for details).After history, exam, and medical workup I  feel the patient has beenappropriately medically screened and is safe for discharge home. Pertinent diagnoses were discussed with the patient. Patient was given return precautions. Rx / DC Orders ED Discharge Orders    None       Gilman Olazabal, MD 05/03/20 [phone]

## 2020-05-08 ENCOUNTER — Telehealth: Payer: Self-pay | Admitting: *Deleted

## 2020-05-08 NOTE — Telephone Encounter (Signed)
Pt called regarding [facility] not receiving referral.  RNCM reviewed chart to see that it had been sent and resent as requested by pt.

## 2020-07-04 ENCOUNTER — Emergency Department (HOSPITAL_COMMUNITY): Payer: Medicare (Managed Care)

## 2020-07-04 ENCOUNTER — Inpatient Hospital Stay (HOSPITAL_COMMUNITY): Payer: Medicare (Managed Care)

## 2020-07-04 ENCOUNTER — Encounter (HOSPITAL_COMMUNITY): Payer: Self-pay | Admitting: Pharmacy Technician

## 2020-07-04 ENCOUNTER — Inpatient Hospital Stay (HOSPITAL_COMMUNITY)
Admission: EM | Admit: 2020-07-04 | Discharge: 2020-07-06 | DRG: 948 | Disposition: A | Discharge status: Home Health | Payer: Medicare (Managed Care) | Attending: Internal Medicine | Admitting: Internal Medicine

## 2020-07-04 ENCOUNTER — Other Ambulatory Visit: Payer: Self-pay

## 2020-07-04 DIAGNOSIS — R569 Unspecified convulsions: Secondary | ICD-10-CM

## 2020-07-04 DIAGNOSIS — E119 Type 2 diabetes mellitus without complications: Secondary | ICD-10-CM | POA: Diagnosis not present

## 2020-07-04 DIAGNOSIS — Z9049 Acquired absence of other specified parts of digestive tract: Secondary | ICD-10-CM | POA: Diagnosis not present

## 2020-07-04 DIAGNOSIS — Z9119 Patient's noncompliance with other medical treatment and regimen: Secondary | ICD-10-CM

## 2020-07-04 DIAGNOSIS — I639 Cerebral infarction, unspecified: Secondary | ICD-10-CM

## 2020-07-04 DIAGNOSIS — G8929 Other chronic pain: Secondary | ICD-10-CM | POA: Diagnosis present

## 2020-07-04 DIAGNOSIS — Z981 Arthrodesis status: Secondary | ICD-10-CM | POA: Diagnosis not present

## 2020-07-04 DIAGNOSIS — Z794 Long term (current) use of insulin: Secondary | ICD-10-CM | POA: Diagnosis not present

## 2020-07-04 DIAGNOSIS — J45909 Unspecified asthma, uncomplicated: Secondary | ICD-10-CM | POA: Diagnosis present

## 2020-07-04 DIAGNOSIS — R471 Dysarthria and anarthria: Secondary | ICD-10-CM | POA: Diagnosis present

## 2020-07-04 DIAGNOSIS — Z9114 Patient's other noncompliance with medication regimen: Secondary | ICD-10-CM

## 2020-07-04 DIAGNOSIS — N1832 Chronic kidney disease, stage 3b: Secondary | ICD-10-CM | POA: Diagnosis present

## 2020-07-04 DIAGNOSIS — Z886 Allergy status to analgesic agent status: Secondary | ICD-10-CM

## 2020-07-04 DIAGNOSIS — Z8711 Personal history of peptic ulcer disease: Secondary | ICD-10-CM | POA: Diagnosis not present

## 2020-07-04 DIAGNOSIS — F319 Bipolar disorder, unspecified: Secondary | ICD-10-CM | POA: Diagnosis present

## 2020-07-04 DIAGNOSIS — K219 Gastro-esophageal reflux disease without esophagitis: Secondary | ICD-10-CM | POA: Diagnosis present

## 2020-07-04 DIAGNOSIS — Z91018 Allergy to other foods: Secondary | ICD-10-CM | POA: Diagnosis not present

## 2020-07-04 DIAGNOSIS — Z882 Allergy status to sulfonamides status: Secondary | ICD-10-CM | POA: Diagnosis not present

## 2020-07-04 DIAGNOSIS — E1140 Type 2 diabetes mellitus with diabetic neuropathy, unspecified: Secondary | ICD-10-CM

## 2020-07-04 DIAGNOSIS — R531 Weakness: Secondary | ICD-10-CM | POA: Diagnosis present

## 2020-07-04 DIAGNOSIS — Z8249 Family history of ischemic heart disease and other diseases of the circulatory system: Secondary | ICD-10-CM

## 2020-07-04 DIAGNOSIS — Z8673 Personal history of transient ischemic attack (TIA), and cerebral infarction without residual deficits: Secondary | ICD-10-CM | POA: Diagnosis not present

## 2020-07-04 DIAGNOSIS — Z881 Allergy status to other antibiotic agents status: Secondary | ICD-10-CM

## 2020-07-04 DIAGNOSIS — Z8616 Personal history of COVID-19: Secondary | ICD-10-CM

## 2020-07-04 DIAGNOSIS — I129 Hypertensive chronic kidney disease with stage 1 through stage 4 chronic kidney disease, or unspecified chronic kidney disease: Secondary | ICD-10-CM | POA: Diagnosis present

## 2020-07-04 DIAGNOSIS — R2990 Unspecified symptoms and signs involving the nervous system: Secondary | ICD-10-CM

## 2020-07-04 DIAGNOSIS — G40909 Epilepsy, unspecified, not intractable, without status epilepticus: Secondary | ICD-10-CM | POA: Diagnosis present

## 2020-07-04 DIAGNOSIS — R4182 Altered mental status, unspecified: Secondary | ICD-10-CM | POA: Diagnosis present

## 2020-07-04 DIAGNOSIS — H40009 Preglaucoma, unspecified, unspecified eye: Secondary | ICD-10-CM | POA: Diagnosis present

## 2020-07-04 DIAGNOSIS — G43909 Migraine, unspecified, not intractable, without status migrainosus: Secondary | ICD-10-CM | POA: Diagnosis present

## 2020-07-04 DIAGNOSIS — Z880 Allergy status to penicillin: Secondary | ICD-10-CM | POA: Diagnosis not present

## 2020-07-04 DIAGNOSIS — Z888 Allergy status to other drugs, medicaments and biological substances status: Secondary | ICD-10-CM | POA: Diagnosis not present

## 2020-07-04 DIAGNOSIS — I10 Essential (primary) hypertension: Secondary | ICD-10-CM | POA: Diagnosis not present

## 2020-07-04 DIAGNOSIS — Z20822 Contact with and (suspected) exposure to covid-19: Secondary | ICD-10-CM | POA: Diagnosis present

## 2020-07-04 DIAGNOSIS — Z87891 Personal history of nicotine dependence: Secondary | ICD-10-CM

## 2020-07-04 DIAGNOSIS — E1165 Type 2 diabetes mellitus with hyperglycemia: Secondary | ICD-10-CM | POA: Diagnosis present

## 2020-07-04 DIAGNOSIS — Z841 Family history of disorders of kidney and ureter: Secondary | ICD-10-CM

## 2020-07-04 DIAGNOSIS — E039 Hypothyroidism, unspecified: Secondary | ICD-10-CM | POA: Diagnosis present

## 2020-07-04 DIAGNOSIS — Z833 Family history of diabetes mellitus: Secondary | ICD-10-CM

## 2020-07-04 DIAGNOSIS — Z79899 Other long term (current) drug therapy: Secondary | ICD-10-CM

## 2020-07-04 DIAGNOSIS — N1831 Chronic kidney disease, stage 3a: Secondary | ICD-10-CM | POA: Diagnosis present

## 2020-07-04 DIAGNOSIS — N183 Chronic kidney disease, stage 3 unspecified: Secondary | ICD-10-CM | POA: Diagnosis present

## 2020-07-04 DIAGNOSIS — Z7902 Long term (current) use of antithrombotics/antiplatelets: Secondary | ICD-10-CM

## 2020-07-04 DIAGNOSIS — Z79891 Long term (current) use of opiate analgesic: Secondary | ICD-10-CM

## 2020-07-04 DIAGNOSIS — E1122 Type 2 diabetes mellitus with diabetic chronic kidney disease: Secondary | ICD-10-CM | POA: Diagnosis present

## 2020-07-04 LAB — COMPREHENSIVE METABOLIC PANEL
ALT: 33 U/L (ref 0–44)
AST: 22 U/L (ref 15–41)
Albumin: 4.6 g/dL (ref 3.5–5.0)
Alkaline Phosphatase: 67 U/L (ref 38–126)
Anion gap: 11 (ref 5–15)
BUN: 32 mg/dL — ABNORMAL HIGH (ref 6–20)
CO2: 20 mmol/L — ABNORMAL LOW (ref 22–32)
Calcium: 9.7 mg/dL (ref 8.9–10.3)
Chloride: 105 mmol/L (ref 98–111)
Creatinine, Ser: 1.79 mg/dL — ABNORMAL HIGH (ref 0.61–1.24)
GFR calc Af Amer: 47 mL/min — ABNORMAL LOW (ref >60)
GFR calc non Af Amer: 41 mL/min — ABNORMAL LOW (ref >60)
Glucose, Bld: 170 mg/dL — ABNORMAL HIGH (ref 70–99)
Potassium: 4.9 mmol/L (ref 3.5–5.1)
Sodium: 136 mmol/L (ref 135–145)
Total Bilirubin: 0.4 mg/dL (ref 0.3–1.2)
Total Protein: 7.8 g/dL (ref 6.5–8.1)

## 2020-07-04 LAB — DIFFERENTIAL
Abs Immature Granulocytes: 0.02 10*3/uL (ref 0.00–0.07)
Basophils Absolute: 0 10*3/uL (ref 0.0–0.1)
Basophils Relative: 0 %
Eosinophils Absolute: 0.1 10*3/uL (ref 0.0–0.5)
Eosinophils Relative: 1 %
Immature Granulocytes: 0 %
Lymphocytes Relative: 31 %
Lymphs Abs: 2 10*3/uL (ref 0.7–4.0)
Monocytes Absolute: 0.5 10*3/uL (ref 0.1–1.0)
Monocytes Relative: 8 %
Neutro Abs: 3.9 10*3/uL (ref 1.7–7.7)
Neutrophils Relative %: 60 %

## 2020-07-04 LAB — PROTIME-INR
INR: 1.1 (ref 0.8–1.2)
Prothrombin Time: 14 seconds (ref 11.4–15.2)

## 2020-07-04 LAB — GLUCOSE, CAPILLARY: Glucose-Capillary: 100 mg/dL — ABNORMAL HIGH (ref 70–99)

## 2020-07-04 LAB — I-STAT CHEM 8, ED
BUN: 34 mg/dL — ABNORMAL HIGH (ref 6–20)
Calcium, Ion: 1.35 mmol/L (ref 1.15–1.40)
Chloride: 108 mmol/L (ref 98–111)
Creatinine, Ser: 1.8 mg/dL — ABNORMAL HIGH (ref 0.61–1.24)
Glucose, Bld: 166 mg/dL — ABNORMAL HIGH (ref 70–99)
HCT: 43 % (ref 39.0–52.0)
Hemoglobin: 14.6 g/dL (ref 13.0–17.0)
Potassium: 4.9 mmol/L (ref 3.5–5.1)
Sodium: 141 mmol/L (ref 135–145)
TCO2: 19 mmol/L — ABNORMAL LOW (ref 22–32)

## 2020-07-04 LAB — SARS CORONAVIRUS 2 BY RT PCR (HOSPITAL ORDER, PERFORMED IN ~~LOC~~ HOSPITAL LAB): SARS Coronavirus 2: NEGATIVE

## 2020-07-04 LAB — APTT: aPTT: 32 seconds (ref 24–36)

## 2020-07-04 LAB — CBC
HCT: 41.9 % (ref 39.0–52.0)
Hemoglobin: 13 g/dL (ref 13.0–17.0)
MCH: 27.1 pg (ref 26.0–34.0)
MCHC: 31 g/dL (ref 30.0–36.0)
MCV: 87.3 fL (ref 80.0–100.0)
Platelets: 169 10*3/uL (ref 150–400)
RBC: 4.8 MIL/uL (ref 4.22–5.81)
RDW: 17.2 % — ABNORMAL HIGH (ref 11.5–15.5)
WBC: 6.5 10*3/uL (ref 4.0–10.5)
nRBC: 0 % (ref 0.0–0.2)

## 2020-07-04 LAB — CBG MONITORING, ED
Glucose-Capillary: 167 mg/dL — ABNORMAL HIGH (ref 70–99)
Glucose-Capillary: 118 mg/dL — ABNORMAL HIGH (ref 70–99)

## 2020-07-04 LAB — HEMOGLOBIN A1C
Hgb A1c MFr Bld: 6.7 % — ABNORMAL HIGH (ref 4.8–5.6)
Mean Plasma Glucose: 145.59 mg/dL

## 2020-07-04 MED ORDER — SODIUM CHLORIDE 0.9% FLUSH
3.0000 mL | Freq: Two times a day (BID) | INTRAVENOUS | Status: DC
  Administered 2020-07-04 – 2020-07-06 (×5): 3 mL via INTRAVENOUS

## 2020-07-04 MED ORDER — CLOPIDOGREL BISULFATE 75 MG PO TABS
75.0000 mg | ORAL_TABLET | Freq: Every day | ORAL | Status: DC
  Administered 2020-07-04 – 2020-07-06 (×3): 75 mg via ORAL
  Filled 2020-07-04 (×3): qty 1

## 2020-07-04 MED ORDER — INSULIN ASPART 100 UNIT/ML ~~LOC~~ SOLN
0.0000 [IU] | Freq: Three times a day (TID) | SUBCUTANEOUS | Status: DC
  Administered 2020-07-05 – 2020-07-06 (×3): 2 [IU] via SUBCUTANEOUS

## 2020-07-04 MED ORDER — IOHEXOL 350 MG/ML SOLN
75.0000 mL | Freq: Once | INTRAVENOUS | Status: AC | PRN
  Administered 2020-07-04: 75 mL via INTRAVENOUS

## 2020-07-04 MED ORDER — ALBUTEROL SULFATE (2.5 MG/3ML) 0.083% IN NEBU: 2.5000 mg | INHALATION_SOLUTION | Freq: Four times a day (QID) | RESPIRATORY_TRACT | Status: DC | PRN

## 2020-07-04 MED ORDER — ATORVASTATIN CALCIUM 80 MG PO TABS
80.0000 mg | ORAL_TABLET | Freq: Every day | ORAL | Status: DC
  Administered 2020-07-04 – 2020-07-06 (×3): 80 mg via ORAL
  Filled 2020-07-04 (×3): qty 1

## 2020-07-04 MED ORDER — ACETAMINOPHEN 325 MG PO TABS
650.0000 mg | ORAL_TABLET | Freq: Four times a day (QID) | ORAL | Status: DC | PRN
  Administered 2020-07-04 – 2020-07-05 (×2): 650 mg via ORAL
  Filled 2020-07-04 (×2): qty 2

## 2020-07-04 MED ORDER — ONDANSETRON HCL 4 MG PO TABS: 4.0000 mg | ORAL_TABLET | Freq: Four times a day (QID) | ORAL | Status: DC | PRN

## 2020-07-04 MED ORDER — ENOXAPARIN SODIUM 40 MG/0.4ML ~~LOC~~ SOLN
40.0000 mg | SUBCUTANEOUS | Status: DC
  Administered 2020-07-04 – 2020-07-05 (×2): 40 mg via SUBCUTANEOUS
  Filled 2020-07-04 (×3): qty 0.4

## 2020-07-04 MED ORDER — OXYCODONE-ACETAMINOPHEN 5-325 MG PO TABS
1.0000 | ORAL_TABLET | Freq: Once | ORAL | Status: AC
  Administered 2020-07-04: 1 via ORAL
  Filled 2020-07-04: qty 1

## 2020-07-04 MED ORDER — ONDANSETRON HCL 4 MG/2ML IJ SOLN: 4.0000 mg | Freq: Four times a day (QID) | INTRAMUSCULAR | Status: DC | PRN

## 2020-07-04 MED ORDER — INSULIN ASPART 100 UNIT/ML ~~LOC~~ SOLN
0.0000 [IU] | Freq: Every day | SUBCUTANEOUS | Status: DC
  Administered 2020-07-05: 2 [IU] via SUBCUTANEOUS

## 2020-07-04 MED ORDER — ACETAMINOPHEN 650 MG RE SUPP: 650.0000 mg | Freq: Four times a day (QID) | RECTAL | Status: DC | PRN

## 2020-07-04 MED ORDER — SODIUM CHLORIDE 0.9 % IV SOLN: Freq: Once | INTRAVENOUS | Status: AC

## 2020-07-04 MED ORDER — SODIUM CHLORIDE 0.9% FLUSH: 3.0000 mL | Freq: Once | INTRAVENOUS | Status: DC

## 2020-07-04 NOTE — Consult Note (Signed)
Referring Physician: Dr. Stevie Kern    Chief Complaint: Left sided weakness  HPI: Nicholas [name] is an 59 y.o. male with a PMHx of seizures, DM2, TIA, bipolar I disorder, HTN, hyperthyroidism and migraines who presents acutely with left sided weakness. On arrival to the bridge, he started exhibiting diffuse limb jerking with a blank stare; the jerking movements were of varying amplitude and frequency, also varying with tactile stimulation. After the jerking spell, which lasted for about 30 seconds, the patient's speech was dysarthric, slow and stuttering, atypical for a lesional aphasia and most consistent with embellishment.   Home medications include Plavix, atorvastatin and Keppra (250 mg BID).   LSN: 1000 tPA Given: No: Overall presentation most consistent with functional/non-physiological deficits.  Past Medical History:  Diagnosis Date  . Asthma   . Bipolar 1 disorder (HCC)   . Borderline glaucoma   . Chronic pain   . Epididymal pain    LEFT  . 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 Feeling of incomplete bladder emptying   . Frequency of urination   . Gastric ulcer   . GERD (gastroesophageal reflux disease)   . Hypertension   . Hyperthyroidism    NO MEDS   . Migraine   . Seizures (HCC)   . TIA (transient ischemic attack)   . Type 2 diabetes mellitus (HCC)     Past Surgical History:  Procedure Laterality Date  . ABDOMINAL SURGERY    . ANTERIOR CERVICAL DECOMP/DISCECTOMY FUSION  2007   C4  --  C6  . CERVICAL FUSION    . CHOLECYSTECTOMY    . CYSTOSCOPY N/A 11/09/2013   Procedure: CYSTOSCOPY FLEXIBLE;  Surgeon: Bjorn Pippin, MD;  Location: [facility];  Service: Urology;  Laterality: N/A;  . EPIDIDYMECTOMY Left 11/09/2013   Procedure:  LEFT EPIDIDYMECTOMY;  Surgeon: Bjorn Pippin, MD;  [facility];  Service: Urology;  Laterality: Left;  POSSIBLE  OUTPATIENT WITH OBSERVATION  . EXCISION LIPOMA LEFT SHOULDER  2004 (APPROX)  . MULTIPLE CYST REMOVED FROM CHEST  AGE 63  . OTHER SURGICAL HISTORY     hemorroid surgery   . TESTICLE REMOVAL Left   . TONSILLECTOMY      Family History  Problem Relation Age of Onset  . Heart failure Mother   . Diabetes Mother   . Hypertension Mother   . Cirrhosis Father   . Heart failure Father   . Kidney disease Father    Social History:  reports that he quit smoking about 27 years ago. His smoking use included cigarettes. He has a 3.75 pack-year smoking history. He has never used smokeless tobacco. He reports that he does not drink alcohol and does not use drugs.  Allergies:  Allergies  Allergen Reactions  . Nsaids Other (See Comments)    D/t gastric ulcer  . Tramadol Other (See Comments)    GI UPSET (PT HAS ULCER)  . Amoxicillin Other (See Comments)    THRUSH  . Ampicillin Other (See Comments)    THRUSH  . Dilantin [Phenytoin] Other (See Comments)    SEVERE SKIN FLAKING / PEELING  . Penicillins Other (See Comments)    THRUSH  . Risperidone And Related Other (See Comments)    hallucinations  . Strawberry Extract Swelling    LIPS SWELL  . Amoxicillin Other (See Comments)    Thrush   .  Bactrim [Sulfamethoxazole-Trimethoprim] Hives  . Bactrim [Sulfamethoxazole-Trimethoprim] Hives  . Depakote [Divalproex Sodium]     Causes double vision and speech problems   . Depakote [Divalproex Sodium] Other (See Comments)    Causes double vision  . Dilantin [Phenytoin] Other (See Comments)    Severe skin peeling  . Nsaids Other (See Comments)    Due to ulcer  . Oatmeal Hives  . Risperidone And Related Other (See Comments)    Hallucinations   . Strawberry (Diagnostic) Swelling  . Ultram [Tramadol Hcl]   . Oatmeal Hives    Home Medications:  No current facility-administered medications on file prior to encounter.   Current Outpatient Medications on File Prior to Encounter  Medication Sig  Dispense Refill  . acetaminophen (TYLENOL 8 HOUR) 650 MG CR tablet Take 1 tablet (650 mg total) by mouth every 8 (eight) hours. 30 tablet 0  . acetaminophen-codeine 120-12 MG/5ML solution Take 10 mLs by mouth every 4 (four) hours as needed for moderate pain. 120 mL 0  . albuterol (PROVENTIL HFA;VENTOLIN HFA) 108 (90 Base) MCG/ACT inhaler Inhale 1-2 puffs into the lungs every 6 (six) hours as needed for wheezing or shortness of breath. 1 Inhaler 0  . amitriptyline (ELAVIL) 25 MG tablet Take 25 mg by mouth at bedtime.    [name] atorvastatin (LIPITOR) 80 MG tablet Take 80 mg by mouth daily.    [name] azithromycin (ZITHROMAX) 250 MG tablet Take 1 tablet (250 mg total) by mouth daily. 4 tablet 0  . benzonatate (TESSALON) 100 MG capsule Take 1 capsule (100 mg total) by mouth every 8 (eight) hours. 21 capsule 0  . carisoprodol (SOMA) 350 MG tablet Take 1 tablet (350 mg total) 3 (three) times daily as needed by mouth for muscle spasms. 15 tablet 0  . clindamycin (CLEOCIN) 300 MG capsule Take 1 capsule (300 mg total) by mouth 3 (three) times daily. 12 capsule 0  . clopidogrel (PLAVIX) 75 MG tablet Take 1 tablet (75 mg total) by mouth daily. 30 tablet 0  . clotrimazole (LOTRIMIN) 1 % cream Apply to affected area 2 times daily until it is resolved. 28 g 0  . doxycycline (VIBRAMYCIN) 100 MG capsule Take 1 capsule (100 mg total) by mouth 2 (two) times daily. 20 capsule 0  . fluticasone (FLONASE) 50 MCG/ACT nasal spray Place 2 sprays into both nostrils daily for 7 days. 1 g 0  . fluticasone-salmeterol (ADVAIR HFA) 115-21 MCG/ACT inhaler Inhale 2 puffs into the lungs daily.    [name] gabapentin (NEURONTIN) 300 MG capsule Take 300 mg by mouth 2 (two) times daily.     . Guaifenesin 1200 MG TB12 Take 1 tablet (1,200 mg total) by mouth 2 (two) times daily. 20 each 0  . insulin glargine (LANTUS) 100 UNIT/ML injection Inject 45 Units into the skin at bedtime.     . insulin lispro (HUMALOG) 100 UNIT/ML injection Inject 6 Units into  the skin 3 (three) times daily before meals. Sliding scale    . insulin regular (NOVOLIN R,HUMULIN R) 100 units/mL injection Inject 50 Units into the skin 4 (four) times daily -  before meals and at bedtime.    . levETIRAcetam (KEPPRA) 250 MG tablet Take 1 tablet (250 mg total) by mouth 2 (two) times daily. 60 tablet 0  . levofloxacin (LEVAQUIN) 750 MG tablet Take 1 tablet (750 mg total) by mouth daily. 7 tablet 0  . lidocaine (LIDODERM) 5 % Place 1 patch onto the skin daily. Remove & Discard patch within 12  hours or as directed by MD 30 patch 0  . lidocaine (XYLOCAINE) 2 % solution Use as directed 15 mLs in the mouth or throat as needed for mouth pain. 100 mL 0  . losartan (COZAAR) 50 MG tablet Take 50 mg by mouth daily.    . metFORMIN (GLUCOPHAGE) 500 MG tablet Take 1,000 mg by mouth 2 (two) times daily with a meal.     . methocarbamol (ROBAXIN) 500 MG tablet Take 1 tablet (500 mg total) by mouth 2 (two) times daily. 20 tablet 0  . nortriptyline (PAMELOR) 50 MG capsule Take 50 mg at bedtime by mouth.    . promethazine (PHENERGAN) 25 MG tablet Take 1 tablet (25 mg total) by mouth every 6 (six) hours as needed for nausea or vomiting. 20 tablet 0  . promethazine-dextromethorphan (PROMETHAZINE-DM) 6.25-15 MG/5ML syrup Take 5 mLs by mouth 4 (four) times daily as needed for cough. 120 mL 0  . rizatriptan (MAXALT) 10 MG tablet Take 10 mg by mouth as needed for migraine. May repeat in 2 hours if needed    . sucralfate (CARAFATE) 1 GM/10ML suspension Take 10 mLs (1 g total) by mouth 4 (four) times daily -  with meals and at bedtime. 420 mL 0  . VALSARTAN PO Take 325 mg by mouth daily.       ROS: Unable to obtain due to functional dysarthria/dysphasia.   Physical Examination: Weight 86 kg.  HEENT: [place]/AT Lungs: Respirations unlabored Ext: No edema  Neurologic Examination: Mental Status: After seizure-like spell: Awake and alert. Oriented to the day and the year, but not the month. Oriented to  [facility] for the city and "[place]" for the state. Speech with dysarthric, slow and stuttering quality, atypical for a lesional aphasia and most consistent with embellishment.  Cranial Nerves: II:  Blinks to threat in both temporal visual fields, but with stated inability to see in left hemifield with confronation testing.  III,IV, VI: No ptosis. EOMI. No nystagmus.  V: Subjective loss of temperature sensation to left side of face.  VII: Grimace is symmetric VIII: Hearing intact to voice IX,X: No hypophonia XI: Head is midline XII: Midline tongue extension and also will wag tongue to right and left Motor: LUE with giveway weakness 4/5 LLE: Inconsistent effort with giveway weakness and tremor-like movements during strength testing. Max strength at hip 3/5, max knee extension/flexion 4/5 RUE and RLE 5/5 Sensory: Subjective loss of temperature sensation to LUE and LLE. Subjectively insensate to FT and pressure LUE and LLE. Intact sensation on the right.  Deep Tendon Reflexes:  2+ bilateral biceps, brachioradialis, patellae and achilles Toes downgoing bilaterally  Cerebellar: No ataxia with FNF on the right. Grossly embellished circular movements of left forearm and hand for about 30 seconds when asked to perform FNF, followed by abrupt/rapid movement of limb with index finger towards face, then touches right cheekbone.  Gait: Deferred  Results for orders placed or performed during the hospital encounter of 07/04/20 (from the past 48 hour(s))  CBG monitoring, ED     Status: Abnormal   Collection Time: 07/04/20 11:48 AM  Result Value Ref Range   Glucose-Capillary 167 (H) 70 - 99 mg/dL    Comment: Glucose reference range applies only to samples taken after fasting for at least 8 hours.   Comment 1 Notify RN    Comment 2 Document in Chart    No results found.  Assessment: 59 y.o. male with a history of seizures and TIA, who presents with acute onset  of left sided weakness. Seizure versus  pseudoseizure occurred at the bridge.  1. Review of presenting features: Presents acutely with left sided weakness first noted at his place of residence. On arrival to the bridge, he started exhibiting diffuse limb jerking with a blank stare; the jerking movements were of varying amplitude and frequency, also varying with tactile stimulation. After the jerking spell, which lasted for about 30 seconds, the patient's speech was dysarthric, slow and stuttering, atypical for a lesional aphasia and most consistent with embellishment.   2. Seen during a previous hospital presentation by Dr. Amada JupiterKirkpatrick. 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  3. Stroke Risk Factors -  DM2, TIA and HTN 4. Home medications include Plavix, atorvastatin and Keppra (250 mg BID). 5. CT head: No acute abnormality 6. CTA of head and neck: No LVO 7. IV tPA note administered due to overall presentation being most consistent with functional/non-physiological deficits. Risks of administration significantly outweigh potential benefits. Will obtain STAT MRI brain. Discussed with patient, who expressed understanding and agreement with the plan.     Recommendations: 1. MRI brain 2. Continue outpatient dosing regimens of Plavix, atorvastatin and Keppra 3. Telemetry monitoring 4. Frequent neuro checks 5. Further recommendations following MRI 6. EEG (ordered)  @Electronically  signed: Dr. Caryl PinaEric Gentle Hoge@  07/04/2020, 11:59 AM

## 2020-07-04 NOTE — ED Notes (Signed)
This RN to MRI with pt

## 2020-07-04 NOTE — Progress Notes (Signed)
EEG completed, results pending. 

## 2020-07-04 NOTE — Code Documentation (Signed)
Patient living at [facility]. He was LKW at 1000 when he called down to the front desk. A home health nurse came by to see pt and when pt opened the door he slumped over to the side and passed out. EMS was called. When they noted left sided weakness and speech difficulty they called a code stroke. BP 98/70 and blood sugar in the 180's. Pt taken to [facility] and met by stroke team, RN and MD at the bridge. During initial assessment pt began to exhibit "seizure-like" activity with jerking and blank stare. It was not a consistent side or strength. Lasted about 30 seconds. Afterward pt was speaking slowly and with some stuttering. He does complain of left sided weakness, numbness and tingling, and double vision in his left eye only. His NIHSS is an 7 (see doc). Delay to CT because of the seizure and attempt to get an IV to treat him. Pt taken for CT and CTA and then a STAT MRI for treatment decisions. No TPA r/t MRI negative for acute stroke. Care Plan: no stroke, defer to medicine.  Hand off with Patent examiner.

## 2020-07-04 NOTE — ED Notes (Signed)
Gave PT sprite, crackers and cheese. Per [employer]

## 2020-07-04 NOTE — H&P (Signed)
History and Physical    Nicholas [name] EAV:[id number] DOB: 26-Jul-1961 DOA: 07/04/2020  Referring MD/NP/PA: Daryel GeraldMichael Dysktra PCP: PatientValla Leaver, No Pcp Per  Patient coming [facility]: Living at [facility]  Chief Complaint: Left-sided weakness  I have personally briefly reviewed patient's old medical records in [facility]   HPI: Nicholas [name] is a 59 y.o. male with medical history significant of hypertension, hyperthyroidism, DM type II, bipolar 1 disorder, migraine headaches, seizure disorder, and history of COVID-19 infection in 2020 presented with complaints of left-sided weakness and slurred speech.  Records note EMS was called after a home health nurse came by to see the patient and when the patient opened the door he slumped over to the side and passed out.  It is not clear the patient was out.  Despite the change in his speech he reports knowing what he wants to say, but is having a difficult time getting it out.[name].  He can move his arm some, but is unable to move his left leg at all.  Notes associated symptoms of a severe left-sided headache, numbness/tingling, change in vision in left eye, and currently lumbar back pain.  Denies having any chest pain, recent fever, sick contacts, palpitations, abdominal pain, nausea, vomiting, diarrhea, or drug use.    Review of records shows previous hospitalizations in 08/2018 with left-sided weakness for which patient received TPA, but MRI was normal.  Last hospitalization on 03/07/2020 with right-sided weakness there was a question of possible pseudoseizures as patient reportedly had spells with normal EEG question possibility of somatizations  ED Course: He was seen as a code stroke upon entering the emergency department and was evaluated by neurology.  Witnessed having diffuse limb jerking with blank stare lasting approximately 30 seconds patient was noted to be dysarthric.  CT scan of the brain showed no acute abnormalities.  Patient was not deemed a  TPA candidate.  His vital signs were noted to be relatively stable. Labs significant for BUN 32, creatinine 1.79, and glucose 170.  Subsequently MRI of the brain did not show any acute abnormalities.  COVID-19 screening was still pending.  Review of Systems  Constitutional: Positive for malaise/fatigue. Negative for fever.  HENT: Negative for ear discharge and nosebleeds.   Eyes: Positive for blurred vision. Negative for discharge.  Respiratory: Positive for cough. Negative for shortness of breath.   Cardiovascular: Negative for chest pain and leg swelling.  Gastrointestinal: Negative for abdominal pain, diarrhea, nausea and vomiting.  Genitourinary: Negative for dysuria and hematuria.  Musculoskeletal: Positive for back pain and falls.  Neurological: Positive for speech change, focal weakness and loss of consciousness.  Psychiatric/Behavioral: Negative for substance abuse. The patient has insomnia.     Past Medical History:  Diagnosis Date  . Asthma   . Bipolar 1 disorder (HCC)   . Borderline glaucoma   . Chronic pain   . Epididymal pain    LEFT  . 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 Feeling of incomplete bladder emptying   . Frequency of urination   . Gastric ulcer   . GERD (gastroesophageal reflux disease)   . Hypertension   . Hyperthyroidism    NO MEDS   . Migraine   . Seizures (HCC)   . TIA (transient ischemic attack)   . Type 2 diabetes mellitus [facility](HCC)     Past Surgical  History:  Procedure Laterality Date  . ABDOMINAL SURGERY    . ANTERIOR CERVICAL DECOMP/DISCECTOMY FUSION  2007   C4  --  C6  . CERVICAL FUSION    . CHOLECYSTECTOMY    . CYSTOSCOPY N/A 11/09/2013   Procedure: CYSTOSCOPY FLEXIBLE;  Surgeon: Bjorn Pippin, MD;  Location: [facility];  Service: Urology;  Laterality: N/A;  . EPIDIDYMECTOMY Left 11/09/2013   Procedure:  LEFT EPIDIDYMECTOMY;   Surgeon: Bjorn Pippin, MD;  Location: [facility];  Service: Urology;  Laterality: Left;  POSSIBLE OUTPATIENT WITH OBSERVATION  . EXCISION LIPOMA LEFT SHOULDER  2004 (APPROX)  . MULTIPLE CYST REMOVED FROM CHEST  AGE 89  . OTHER SURGICAL HISTORY     hemorroid surgery   . TESTICLE REMOVAL Left   . TONSILLECTOMY       reports that he quit smoking about 27 years ago. His smoking use included cigarettes. He has a 3.75 pack-year smoking history. He has never used smokeless tobacco. He reports that he does not drink alcohol and does not use drugs.  Allergies  Allergen Reactions  . Nsaids Other (See Comments)    D/t gastric ulcer  . Tramadol Other (See Comments)    GI UPSET (PT HAS ULCER)  . Amoxicillin Other (See Comments)    THRUSH  . Ampicillin Other (See Comments)    THRUSH  . Dilantin [Phenytoin] Other (See Comments)    SEVERE SKIN FLAKING / PEELING  . Penicillins Other (See Comments)    THRUSH  . Risperidone And Related Other (See Comments)    hallucinations  . Strawberry Extract Swelling    LIPS SWELL  . Amoxicillin Other (See Comments)    Thrush   . Bactrim [Sulfamethoxazole-Trimethoprim] Hives  . Bactrim [Sulfamethoxazole-Trimethoprim] Hives  . Depakote [Divalproex Sodium]     Causes double vision and speech problems   . Depakote [Divalproex Sodium] Other (See Comments)    Causes double vision  . Dilantin [Phenytoin] Other (See Comments)    Severe skin peeling  . Nsaids Other (See Comments)    Due to ulcer  . Oatmeal Hives  . Risperidone And Related Other (See Comments)    Hallucinations   . Strawberry (Diagnostic) Swelling  . Ultram [Tramadol Hcl]   . Oatmeal Hives    Family History  Problem Relation Age of Onset  . Heart failure Mother   . Diabetes Mother   . Hypertension Mother   . Cirrhosis Father   . Heart failure Father   . Kidney disease Father     Prior to Admission medications   Medication Sig Start Date End Date Taking? Authorizing  Provider  acetaminophen (TYLENOL 8 HOUR) 650 MG CR tablet Take 1 tablet (650 mg total) by mouth every 8 (eight) hours. 05/09/19   Derwood Kaplan, MD  acetaminophen-codeine 120-12 MG/5ML solution Take 10 mLs by mouth every 4 (four) hours as needed for moderate pain. 11/25/18   Lawyer, Cristal Deer, PA-C  albuterol (PROVENTIL HFA;VENTOLIN HFA) 108 (90 Base) MCG/ACT inhaler Inhale 1-2 puffs into the lungs every 6 (six) hours as needed for wheezing or shortness of breath. 11/15/18   Rancour, Jeannett Senior, MD  atorvastatin (LIPITOR) 80 MG tablet Take 80 mg by mouth daily.    [provider]  azithromycin (ZITHROMAX) 250 MG tablet Take 1 tablet (250 mg total) by mouth daily. Patient not taking: Reported on 07/04/2020 03/31/18   Melene Plan, DO  benzonatate (TESSALON) 100 MG capsule Take 1 capsule (100 mg total) by mouth every 8 (  eight) hours. Patient not taking: Reported on 07/04/2020 10/28/18   Long, Arlyss Repress, MD  carisoprodol (SOMA) 350 MG tablet Take 1 tablet (350 mg total) 3 (three) times daily as needed by mouth for muscle spasms. Patient not taking: Reported on 07/04/2020 10/13/17   Loren Racer, MD  clindamycin (CLEOCIN) 300 MG capsule Take 1 capsule (300 mg total) by mouth 3 (three) times daily. Patient not taking: Reported on 07/04/2020 07/03/18   Raeford Razor, MD  clopidogrel (PLAVIX) 75 MG tablet Take 1 tablet (75 mg total) by mouth daily. 05/09/19   Derwood Kaplan, MD  clotrimazole (LOTRIMIN) 1 % cream Apply to affected area 2 times daily until it is resolved. Patient not taking: Reported on 07/04/2020 11/26/17   Alvira Monday, MD  doxycycline (VIBRAMYCIN) 100 MG capsule Take 1 capsule (100 mg total) by mouth 2 (two) times daily. Patient not taking: Reported on 07/04/2020 11/15/18   Glynn Octave, MD  fluticasone-salmeterol (ADVAIR HFA) [phone] MCG/ACT inhaler Inhale 2 puffs into the lungs daily.    [provider]  gabapentin (NEURONTIN) 300 MG capsule Take 300 mg by mouth 3  (three) times daily.     [provider]  Guaifenesin 1200 MG TB12 Take 1 tablet (1,200 mg total) by mouth 2 (two) times daily. Patient not taking: Reported on 07/04/2020 11/25/18   Charlestine Night, PA-C  HUMALOG KWIKPEN 200 UNIT/ML KwikPen Inject 0-10 Units into the skin 3 (three) times daily with meals.  07/01/20   [provider]  insulin lispro (HUMALOG) 100 UNIT/ML KwikPen Inject into the skin. 07/04/20   [provider]  LANTUS SOLOSTAR 100 UNIT/ML Solostar Pen Inject 60 Units into the skin 2 (two) times daily. 02/05/20   [provider]  latanoprost (XALATAN) 0.005 % ophthalmic solution 1 drop at bedtime.  07/04/20   [provider]  levETIRAcetam (KEPPRA) 1000 MG tablet Take 1,000 mg by mouth 2 (two) times daily. 07/04/20   [provider]  levETIRAcetam (KEPPRA) 250 MG tablet Take 1 tablet (250 mg total) by mouth 2 (two) times daily. Patient not taking: Reported on 07/04/2020 07/29/18   Charlynne Pander, MD  levofloxacin (LEVAQUIN) 750 MG tablet Take 1 tablet (750 mg total) by mouth daily. Patient not taking: Reported on 07/04/2020 11/25/18   Charlestine Night, PA-C  lidocaine (LIDODERM) 5 % Place 1 patch onto the skin daily. Remove & Discard patch within 12 hours or as directed by MD Patient not taking: Reported on 07/04/2020 05/16/18   Petrucelli, Samantha R, PA-C  lidocaine (XYLOCAINE) 2 % solution Use as directed 15 mLs in the mouth or throat as needed for mouth pain. Patient not taking: Reported on 07/04/2020 03/26/18   Glynn Octave, MD  losartan (COZAAR) 50 MG tablet Take 50 mg by mouth daily.    [provider]  metFORMIN (GLUCOPHAGE) 1000 MG tablet Take 1,000 mg by mouth 2 (two) times daily. 06/28/20   [provider]  methocarbamol (ROBAXIN) 500 MG tablet Take 1 tablet (500 mg total) by mouth 2 (two) times daily. Patient not taking: Reported on 07/04/2020 05/09/19   Derwood Kaplan, MD  nortriptyline (PAMELOR) 50  MG capsule Take 100 mg by mouth at bedtime.     [provider]  NOVOLOG FLEXPEN 100 UNIT/ML FlexPen Inject into the skin. 07/02/20   [provider]  pantoprazole (PROTONIX) 40 MG tablet Take 40 mg by mouth 2 (two) times daily. 07/03/20   [provider]  promethazine (PHENERGAN) 25 MG tablet Take 1 tablet (  25 mg total) by mouth every 6 (six) hours as needed for nausea or vomiting. Patient not taking: Reported on 07/04/2020 05/04/17   Vanetta Mulders, MD  promethazine-dextromethorphan (PROMETHAZINE-DM) 6.25-15 MG/5ML syrup Take 5 mLs by mouth 4 (four) times daily as needed for cough. Patient not taking: Reported on 07/04/2020 08/06/17   Rolland Porter, MD  rizatriptan (MAXALT) 10 MG tablet Take 10 mg by mouth as needed for migraine. May repeat in 2 hours if needed    [provider]  sertraline (ZOLOFT) 50 MG tablet Take 50 mg by mouth daily. 06/13/20   [provider]  sucralfate (CARAFATE) 1 GM/10ML suspension Take 10 mLs (1 g total) by mouth 4 (four) times daily -  with meals and at bedtime. Patient not taking: Reported on 07/04/2020 06/09/17   Palumbo, April, MD  timolol (TIMOPTIC) 0.5 % ophthalmic solution 1 drop 2 (two) times daily. 04/06/20   [provider]  valsartan (DIOVAN) 320 MG tablet Take 320 mg by mouth daily. 07/04/20   [provider]  zonisamide (ZONEGRAN) 100 MG capsule Take by mouth. 05/17/20   [provider]    Physical Exam:  Constitutional: Middle-age male who appears to be in no acute distress Vitals:   07/04/20 1100 07/04/20 1159 07/04/20 1200 07/04/20 1240  BP:  100/70 94/72   Pulse:  94 91   Resp:  21 23   Temp:    (!) 97 F (36.1 C)  TempSrc:    Temporal  SpO2:  97%    Weight: 86 kg      Eyes: PERRL, lids and conjunctivae normal ENMT: Mucous membranes are moist. Posterior pharynx clear of any exudate or lesions.   Neck: normal, supple, no masses, no thyromegaly Respiratory: clear to auscultation  bilaterally, no wheezing, no crackles. Normal respiratory effort. No accessory muscle use.  Cardiovascular: Regular rate and rhythm, no murmurs / rubs / gallops. No extremity edema. 2+ pedal pulses. No carotid bruits.  Abdomen: no tenderness, no masses palpated. No hepatosplenomegaly. Bowel sounds positive.  Musculoskeletal: no clubbing / cyanosis. No joint deformity upper and lower extremities. Good ROM, no contractures. Normal muscle tone.  Skin: no rashes, lesions, ulcers. No induration Neurologic: CN 2-12 grossly intact.  Strength on the left side appears effort dependent.  3/5 in the left upper extremity and 0/5 on left lower extremity.  Speech is slurred and slowed. Psychiatric: Normal judgment and insight. Alert and oriented x 3. Normal mood.     Labs on Admission: I have personally reviewed following labs and imaging studies  CBC: Recent Labs  Lab 07/04/20 1157 07/04/20 1159  WBC 6.5  --   NEUTROABS 3.9  --   HGB 13.0 14.6  HCT 41.9 43.0  MCV 87.3  --   PLT 169  --    Basic Metabolic Panel: Recent Labs  Lab 07/04/20 1157 07/04/20 1159  NA 136 141  K 4.9 4.9  CL 105 108  CO2 20*  --   GLUCOSE 170* 166*  BUN 32* 34*  CREATININE 1.79* 1.80*  CALCIUM 9.7  --    GFR: Estimated Creatinine Clearance: 46.9 mL/min (A) (by C-G formula based on SCr of 1.8 mg/dL (H)). Liver Function Tests: Recent Labs  Lab 07/04/20 1157  AST 22  ALT 33  ALKPHOS 67  BILITOT 0.4  PROT 7.8  ALBUMIN 4.6   No results for input(s): LIPASE, AMYLASE in the last 168 hours. No results for input(s): AMMONIA in the last 168 hours. Coagulation Profile: Recent  Labs  Lab 07/04/20 1157  INR 1.1   Cardiac Enzymes: No results for input(s): CKTOTAL, CKMB, CKMBINDEX, TROPONINI in the last 168 hours. BNP (last 3 results) No results for input(s): PROBNP in the last 8760 hours. HbA1C: No results for input(s): HGBA1C in the last 72 hours. CBG: Recent Labs  Lab 07/04/20 1148  GLUCAP 167*    Lipid Profile: No results for input(s): CHOL, HDL, LDLCALC, TRIG, CHOLHDL, LDLDIRECT in the last 72 hours. Thyroid Function Tests: No results for input(s): TSH, T4TOTAL, FREET4, T3FREE, THYROIDAB in the last 72 hours. Anemia Panel: No results for input(s): VITAMINB12, FOLATE, FERRITIN, TIBC, IRON, RETICCTPCT in the last 72 hours. Urine analysis:    Component Value Date/Time   COLORURINE YELLOW 05/03/2020 0250   APPEARANCEUR CLEAR 05/03/2020 0250   LABSPEC 1.021 05/03/2020 0250   PHURINE 5.0 05/03/2020 0250   GLUCOSEU 50 (A) 05/03/2020 0250   HGBUR NEGATIVE 05/03/2020 0250   BILIRUBINUR NEGATIVE 05/03/2020 0250   KETONESUR NEGATIVE 05/03/2020 0250   PROTEINUR NEGATIVE 05/03/2020 0250   UROBILINOGEN 0.2 08/29/2015 1125   NITRITE NEGATIVE 05/03/2020 0250   LEUKOCYTESUR NEGATIVE 05/03/2020 0250   Sepsis Labs: No results found for this or any previous visit (from the past 240 hour(s)).   Radiological Exams on Admission: CT Code Stroke CTA Head W/WO contrast  Result Date: 07/04/2020 CLINICAL DATA:  Code stroke EXAM: CT HEAD WITHOUT CONTRAST CT ANGIOGRAPHY OF THE HEAD AND NECK TECHNIQUE: Contiguous axial images were obtained from the base of the skull through the vertex without intravenous contrast. Multidetector CT imaging of the head and neck was performed using the standard protocol during bolus administration of intravenous contrast. Multiplanar CT image reconstructions and MIPs were obtained to evaluate the vascular anatomy. Carotid stenosis measurements (when applicable) are obtained utilizing NASCET criteria, using the distal internal carotid diameter as the denominator. CONTRAST:  100 mL Omnipaque 350 COMPARISON:  05/03/2020 FINDINGS: CT HEAD Brain: There is no acute intracranial hemorrhage, mass effect, or edema. Gray-white differentiation is preserved. There is no extra-axial fluid collection. Ventricles and sulci are within normal limits in size and configuration. Vascular: No  hyperdense vessel or unexpected calcification. Skull: Calvarium is unremarkable. Sinuses/Orbits: No acute finding. Other: None. Review of the MIP images confirms the above findings CTA NECK Aortic arch: Great vessel origins are patent. There is direct origin of the left vertebral artery from the arch. Right carotid system: Patent. No measurable stenosis or evidence of dissection. Left carotid system: Patent. No measurable stenosis or evidence of dissection. Vertebral arteries: Patent.  Right vertebral artery is dominant. Skeleton: Postoperative changes of anterior fusion at C4-C7. Degenerative changes of the cervical spine. Other neck: No mass or adenopathy. Upper chest: No apical lung mass. Review of the MIP images confirms the above findings CTA HEAD Anterior circulation: Intracranial internal carotid arteries are patent. Anterior and middle cerebral arteries are patent. Posterior circulation: Intracranial vertebral arteries, basilar artery, and posterior cerebral arteries are patent. A right posterior communicating artery is present. Venous sinuses: Patent as allowed by contrast bolus timing. Review of the MIP images confirms the above findings IMPRESSION: No acute intracranial hemorrhage or evidence of acute infarction. ASPECT score is 10. No large vessel occlusion or hemodynamically significant stenosis. These results were communicated to Dr. Otelia Limes at 12:24 pmon 7/29/2021by text page via the [facility] messaging system. Electronically Signed   By: Guadlupe Spanish M.D.   On: 07/04/2020 12:33   CT Code Stroke CTA Neck W/WO contrast  Result Date: 07/04/2020 CLINICAL DATA:  Code stroke EXAM: CT HEAD WITHOUT CONTRAST CT ANGIOGRAPHY OF THE HEAD AND NECK TECHNIQUE: Contiguous axial images were obtained from the base of the skull through the vertex without intravenous contrast. Multidetector CT imaging of the head and neck was performed using the standard protocol during bolus administration of intravenous contrast.  Multiplanar CT image reconstructions and MIPs were obtained to evaluate the vascular anatomy. Carotid stenosis measurements (when applicable) are obtained utilizing NASCET criteria, using the distal internal carotid diameter as the denominator. CONTRAST:  100 mL Omnipaque 350 COMPARISON:  05/03/2020 FINDINGS: CT HEAD Brain: There is no acute intracranial hemorrhage, mass effect, or edema. Gray-white differentiation is preserved. There is no extra-axial fluid collection. Ventricles and sulci are within normal limits in size and configuration. Vascular: No hyperdense vessel or unexpected calcification. Skull: Calvarium is unremarkable. Sinuses/Orbits: No acute finding. Other: None. Review of the MIP images confirms the above findings CTA NECK Aortic arch: Great vessel origins are patent. There is direct origin of the left vertebral artery from the arch. Right carotid system: Patent. No measurable stenosis or evidence of dissection. Left carotid system: Patent. No measurable stenosis or evidence of dissection. Vertebral arteries: Patent.  Right vertebral artery is dominant. Skeleton: Postoperative changes of anterior fusion at C4-C7. Degenerative changes of the cervical spine. Other neck: No mass or adenopathy. Upper chest: No apical lung mass. Review of the MIP images confirms the above findings CTA HEAD Anterior circulation: Intracranial internal carotid arteries are patent. Anterior and middle cerebral arteries are patent. Posterior circulation: Intracranial vertebral arteries, basilar artery, and posterior cerebral arteries are patent. A right posterior communicating artery is present. Venous sinuses: Patent as allowed by contrast bolus timing. Review of the MIP images confirms the above findings IMPRESSION: No acute intracranial hemorrhage or evidence of acute infarction. ASPECT score is 10. No large vessel occlusion or hemodynamically significant stenosis. These results were communicated to Dr. Otelia Limes at 12:24  pmon 7/29/2021by text page via the [facility] messaging system. Electronically Signed   By: Guadlupe Spanish M.D.   On: 07/04/2020 12:33   MR BRAIN WO CONTRAST  Result Date: 07/04/2020 CLINICAL DATA:  Neuro deficit, acute, stroke suspected. Additional history obtained from electronic MEDICAL RECORD NUMBERSyncope, patient found to have slurred speech and left-sided weakness, patient with seizure history. EXAM: MRI HEAD WITHOUT CONTRAST TECHNIQUE: Multiplanar, multiecho pulse sequences of the brain and surrounding structures were obtained without intravenous contrast. COMPARISON:  Noncontrast head CT and CT angiogram head/neck performed earlier the same day, brain MRI 09/16/2018 FINDINGS: Brain: Cerebral volume is normal for age. No focal parenchymal signal abnormality is identified. There is no acute infarct. No evidence of intracranial mass. No chronic intracranial blood products. No extra-axial fluid collection. No midline shift. Vascular: Expected proximal arterial flow voids. Skull and upper cervical spine: There redemonstrated nonspecific heterogeneous marrow of the visualized upper cervical spine. No focal suspicious marrow lesion. Sinuses/Orbits: Visualized orbits show no acute finding. Trace ethmoid sinus mucosal thickening. No significant mastoid effusion IMPRESSION: No evidence of acute intracranial abnormality, including acute infarction. Redemonstrated nonspecific heterogeneous marrow signal within the visualized upper cervical spine without focal suspicious osseous lesion. Minimal ethmoid sinus mucosal thickening. Electronically Signed   By: Jackey Loge DO   On: 07/04/2020 13:17   CT HEAD CODE STROKE WO CONTRAST  Result Date: 07/04/2020 CLINICAL DATA:  Code stroke EXAM: CT HEAD WITHOUT CONTRAST CT ANGIOGRAPHY OF THE HEAD AND NECK TECHNIQUE: Contiguous axial images were obtained from the base of the skull through the vertex without intravenous  contrast. Multidetector CT imaging of the head and neck was  performed using the standard protocol during bolus administration of intravenous contrast. Multiplanar CT image reconstructions and MIPs were obtained to evaluate the vascular anatomy. Carotid stenosis measurements (when applicable) are obtained utilizing NASCET criteria, using the distal internal carotid diameter as the denominator. CONTRAST:  100 mL Omnipaque 350 COMPARISON:  05/03/2020 FINDINGS: CT HEAD Brain: There is no acute intracranial hemorrhage, mass effect, or edema. Gray-white differentiation is preserved. There is no extra-axial fluid collection. Ventricles and sulci are within normal limits in size and configuration. Vascular: No hyperdense vessel or unexpected calcification. Skull: Calvarium is unremarkable. Sinuses/Orbits: No acute finding. Other: None. Review of the MIP images confirms the above findings CTA NECK Aortic arch: Great vessel origins are patent. There is direct origin of the left vertebral artery from the arch. Right carotid system: Patent. No measurable stenosis or evidence of dissection. Left carotid system: Patent. No measurable stenosis or evidence of dissection. Vertebral arteries: Patent.  Right vertebral artery is dominant. Skeleton: Postoperative changes of anterior fusion at C4-C7. Degenerative changes of the cervical spine. Other neck: No mass or adenopathy. Upper chest: No apical lung mass. Review of the MIP images confirms the above findings CTA HEAD Anterior circulation: Intracranial internal carotid arteries are patent. Anterior and middle cerebral arteries are patent. Posterior circulation: Intracranial vertebral arteries, basilar artery, and posterior cerebral arteries are patent. A right posterior communicating artery is present. Venous sinuses: Patent as allowed by contrast bolus timing. Review of the MIP images confirms the above findings IMPRESSION: No acute intracranial hemorrhage or evidence of acute infarction. ASPECT score is 10. No large vessel occlusion or  hemodynamically significant stenosis. These results were communicated to Dr. Otelia Limes at 12:24 pmon 7/29/2021by text page via the [facility] messaging system. Electronically Signed   By: Guadlupe Spanish M.D.   On: 07/04/2020 12:33    EKG: Independently reviewed.  Sinus rhythm at 87 bpm with borderline left axis deviation  Assessment/Plan Left-sided weakness Dysarthria: Acute.  Patient presented with left-sided weakness and dysarthria. CT scan and MRI of the brain negative for any acute abnormalities.  He complains of continued left-sided headache.  Per review of records patient has had prior hospitalizations with similar symptoms in 08/2018 with left-sided weakness, and most recently reported on 03/2020 with right-sided weakness.  Question complex migraine vs. Somatization vs. other as the cause. -Admit to a medical telemetry bed -Neurochecks -Check urine drug screen -Continue Plavix and atorvastatin per neurology recommendations -PT/OT/speech to eval and treat -Appreciate neurology consultative services, follow-up for any further recommendations  Seizure-like activity with history of seizure disorder: Acute.  Patient went as having episode of limb jerking witnessed by neurology.  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  Home seizure medications include Keppra 1000 mg twice daily, and Zonegran 200 mg twice daily.  1 g dose increased to 200 mg twice daily during last visit with neurology on 6/11.  Question possibility of seizure versus pseudoseizure.  -Seizure precautions -Follow-up EEG -Continue home medication regimen  Diabetes mellitus type 2: On admission glucose 170.  Last level hemoglobin A1c from 2018 was 9.7.  Home medications appear to include Metformin 1000 mg twice daily and NovoLog insulin. -Hypoglycemic protocol -Check hemoglobin A1c -CBGs before every meal with moderate SSI -Adjust insulin regimen as needed  Migraine  headache: Patient reports currently having a headache significant headache.  Home medications.  Include Maxalt 10 mg as needed for headache. -  Continue pharmacy substitution of Imitrex as needed for headache  Essential hypertension: Blood pressures currently maintained.  Blood pressure medications appear to include Diovan 320 mg daily. -Continue pharmacy substitution of ibesartan  History of hypethyroidism -Check free T4 and TSH  Chronic kidney disease stage IIIb: Creatinine 1.79 with BUN 32.  This appears to be around patient's baseline during last hospitalization in May. -Normal saline IV fluids at 75 mL/h -Recheck kidney function in a.m.  Depression: Home medications include Zoloft 50 mg daily. -Continue Zoloft  GERD: Home medications.  Include Protonix 40 mg twice daily  Ultimately will need to review his home medication list again at some point as patient unable to verify everything due to dysarthria at this time.  DVT prophylaxis: Lovenox Code Status: Full Family Communication: Sister Nicholas [name]) updated over the phone Sister said he needs to be in a facility to be watched. Currently in a hotel until, but reportedly has an apartment that he supposed to move into 2 weeks from now.  Sister reports that he should not live on his own. Disposition Plan: Discharge patient able to return to baseline Consults called: Neurology Admission status: Observation  Clydie Braun MD [facility] Pager [phone]   If 7PM-7AM, please contact night-coverage [email or web address] Password [email or web address]  07/04/2020, 1:35 PM

## 2020-07-04 NOTE — ED Triage Notes (Signed)
Pt bib gcems as a code stroke. EMS called out for syncope, found pt to have slurred speech and some L sided weakness. Pt with hx of sz and stroke. Pt with ? Seizure like activity at the bridge with no post ictal period.  98/70 HR 80 96% RA CBG 160

## 2020-07-04 NOTE — Procedures (Signed)
Patient Name: Nicholas [name]  MRN: [id number]  Epilepsy Attending: Charlsie Quest  Referring Physician/Provider: Dr. Caryl Pina Date: 07/04/2020 Duration: 24.02 mins  Patient history: 59 year old male with history of seizures who presented with acute onset of left-sided weakness.  On examination patient was noted to have diffuse limb jerking with a blank stare.  EEG evaluate for seizures.  Level of alertness: Awake, asleep  AEDs during EEG study: None  Technical aspects: This EEG study was done with scalp electrodes positioned according to the 10-20 International system of electrode placement. Electrical activity was acquired at a sampling rate of 500Hz  and reviewed with a high frequency filter of 70Hz  and a low frequency filter of 1Hz . EEG data were recorded continuously and digitally stored.   Description: The posterior dominant rhythm consists of 8 Hz activity of moderate voltage (25-35 uV) seen predominantly in posterior head regions, symmetric and reactive to eye opening and eye closing. Sleep was characterized by vertex waves, sleep spindles (12 to 14 Hz), maximal frontocentral region.   Hyperventilation and photic stimulation were not performed.     IMPRESSION: This study is within normal limits. No seizures or epileptiform discharges were seen throughout the recording.  Priyanka 

## 2020-07-04 NOTE — ED Provider Notes (Signed)
[name] EMERGENCY DEPARTMENT Provider Note   CSN: [id number] Arrival date & time: 07/04/20  1144  An emergency department physician performed an initial assessment on this suspected stroke patient at 1150.  History Chief Complaint  Patient presents with  . Code Stroke    Nicholas [name] is a 59 y.o. male.  Presented to ER stroke alert.  History of TIA, diabetes, seizures, bipolar disorder, hypertension, hypothyroidism, migraines.  Presents to ER with left-sided weakness.  EMS called for syncope, on arrival patient had been noted to have left-sided weakness and slurred speech.  History limited due to acuity, altered mental status.  Patient due to speech changes had difficulty speaking but denies any other medical complaints besides his left-sided weakness and speech change.  HPI     Past Medical History:  Diagnosis Date  . Asthma   . Bipolar 1 disorder (HCC)   . Borderline glaucoma   . Chronic pain   . Epididymal pain    LEFT  . 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 Feeling of incomplete bladder emptying   . Frequency of urination   . Gastric ulcer   . GERD (gastroesophageal reflux disease)   . Hypertension   . Hyperthyroidism    NO MEDS   . Migraine   . Seizures (HCC)   . TIA (transient ischemic attack)   . Type 2 diabetes mellitus [facility])     Patient Active Problem List   Diagnosis Date Noted  . Left-sided weakness 07/04/2020  . Dysarthria 07/04/2020  . Essential hypertension   . Seizure disorder (HCC) 07/02/2017  . Insulin-requiring or dependent type II diabetes mellitus (HCC) 07/02/2017  . CKD (chronic kidney disease), stage II 07/02/2017  . Normocytic anemia 07/02/2017  . Testicular/scrotal pain 11/09/2013  . Microhematuria 11/09/2013  . Condyloma acuminatum of scrotum 11/09/2013    Past Surgical History:  Procedure Laterality Date  .  ABDOMINAL SURGERY    . ANTERIOR CERVICAL DECOMP/DISCECTOMY FUSION  2007   C4  --  C6  . CERVICAL FUSION    . CHOLECYSTECTOMY    . CYSTOSCOPY N/A 11/09/2013   Procedure: CYSTOSCOPY FLEXIBLE;  Surgeon: Bjorn Pippin, MD;  Location: [facility];  Service: Urology;  Laterality: N/A;  . EPIDIDYMECTOMY Left 11/09/2013   Procedure:  LEFT EPIDIDYMECTOMY;  Surgeon: Bjorn Pippin, MD;  Location: [facility];  Service: Urology;  Laterality: Left;  POSSIBLE OUTPATIENT WITH OBSERVATION  . EXCISION LIPOMA LEFT SHOULDER  2004 (APPROX)  . MULTIPLE CYST REMOVED FROM CHEST  AGE 18  . OTHER SURGICAL HISTORY     hemorroid surgery   . TESTICLE REMOVAL Left   . TONSILLECTOMY         Family History  Problem Relation Age of Onset  . Heart failure Mother   . Diabetes Mother   . Hypertension Mother   . Cirrhosis Father   . Heart failure Father   . Kidney disease Father     Social History   Tobacco Use  . Smoking status: Former Smoker    Packs/day: 0.25    Years: 15.00    Pack years: 3.75    Types: Cigarettes    Quit date: 11/08/1992    Years since quitting: 27.6  . Smokeless tobacco: Never Used  Vaping Use  . Vaping Use: Never used  Substance Use Topics  .  Alcohol use: No  . Drug use: No    Home Medications Prior to Admission medications   Medication Sig Start Date End Date Taking? Authorizing Provider  acetaminophen (TYLENOL 8 HOUR) 650 MG CR tablet Take 1 tablet (650 mg total) by mouth every 8 (eight) hours. 05/09/19   Derwood Kaplan, MD  acetaminophen-codeine 120-12 MG/5ML solution Take 10 mLs by mouth every 4 (four) hours as needed for moderate pain. Patient not taking: Reported on 07/04/2020 11/25/18   Charlestine Night, PA-C  albuterol (PROVENTIL HFA;VENTOLIN HFA) 108 (90 Base) MCG/ACT inhaler Inhale 1-2 puffs into the lungs every 6 (six) hours as needed for wheezing or shortness of breath. 11/15/18   Rancour, Jeannett Senior, MD  atorvastatin (LIPITOR) 80 MG tablet  Take 80 mg by mouth daily.    [provider]  azithromycin (ZITHROMAX) 250 MG tablet Take 1 tablet (250 mg total) by mouth daily. Patient not taking: Reported on 07/04/2020 03/31/18   Melene Plan, DO  benzonatate (TESSALON) 100 MG capsule Take 1 capsule (100 mg total) by mouth every 8 (eight) hours. Patient not taking: Reported on 07/04/2020 10/28/18   Long, Arlyss Repress, MD  carisoprodol (SOMA) 350 MG tablet Take 1 tablet (350 mg total) 3 (three) times daily as needed by mouth for muscle spasms. Patient not taking: Reported on 07/04/2020 10/13/17   Loren Racer, MD  clindamycin (CLEOCIN) 300 MG capsule Take 1 capsule (300 mg total) by mouth 3 (three) times daily. Patient not taking: Reported on 07/04/2020 07/03/18   Raeford Razor, MD  clopidogrel (PLAVIX) 75 MG tablet Take 1 tablet (75 mg total) by mouth daily. 05/09/19   Derwood Kaplan, MD  clotrimazole (LOTRIMIN) 1 % cream Apply to affected area 2 times daily until it is resolved. Patient not taking: Reported on 07/04/2020 11/26/17   Alvira Monday, MD  doxycycline (VIBRAMYCIN) 100 MG capsule Take 1 capsule (100 mg total) by mouth 2 (two) times daily. Patient not taking: Reported on 07/04/2020 11/15/18   Glynn Octave, MD  fluticasone-salmeterol (ADVAIR HFA) [id number] MCG/ACT inhaler Inhale 2 puffs into the lungs daily.    [provider]  gabapentin (NEURONTIN) 300 MG capsule Take 300 mg by mouth 3 (three) times daily.     [provider]  Guaifenesin 1200 MG TB12 Take 1 tablet (1,200 mg total) by mouth 2 (two) times daily. Patient not taking: Reported on 07/04/2020 11/25/18   Charlestine Night, PA-C  HUMALOG KWIKPEN 200 UNIT/ML KwikPen Inject 0-10 Units into the skin 3 (three) times daily with meals.  Patient not taking: Reported on 07/04/2020 07/01/20   [provider]  insulin lispro (HUMALOG) 100 UNIT/ML KwikPen Inject into the skin. Patient not taking: Reported on 07/04/2020 07/04/20   [provider]    LANTUS SOLOSTAR 100 UNIT/ML Solostar Pen Inject 60 Units into the skin 2 (two) times daily. Patient not taking: Reported on 07/04/2020 02/05/20   [provider]  latanoprost (XALATAN) 0.005 % ophthalmic solution Place 1 drop into both eyes at bedtime.  07/04/20   [provider]  levETIRAcetam (KEPPRA) 1000 MG tablet Take 1,000 mg by mouth 2 (two) times daily. 07/04/20   [provider]  levETIRAcetam (KEPPRA) 250 MG tablet Take 1 tablet (250 mg total) by mouth 2 (two) times daily. Patient not taking: Reported on 07/04/2020 07/29/18   Charlynne Pander, MD  levofloxacin (LEVAQUIN) 750 MG tablet Take 1 tablet (750 mg total) by mouth daily. Patient not taking: Reported on 07/04/2020 11/25/18   Charlestine Night, PA-C  lidocaine (LIDODERM) 5 % Place 1 patch onto the skin daily. Remove & Discard patch within 12 hours or as directed by MD Patient not taking: Reported on 07/04/2020 05/16/18   Petrucelli, Samantha R, PA-C  lidocaine (XYLOCAINE) 2 % solution Use as directed 15 mLs in the mouth or throat as needed for mouth pain. Patient not taking: Reported on 07/04/2020 03/26/18   Glynn Octave, MD  losartan (COZAAR) 50 MG tablet Take 50 mg by mouth daily.    [provider]  metFORMIN (GLUCOPHAGE) 1000 MG tablet Take 1,000 mg by mouth 2 (two) times daily. 06/28/20   [provider]  methocarbamol (ROBAXIN) 500 MG tablet Take 1 tablet (500 mg total) by mouth 2 (two) times daily. Patient not taking: Reported on 07/04/2020 05/09/19   Derwood Kaplan, MD  nortriptyline (PAMELOR) 50 MG capsule Take 100 mg by mouth at bedtime.     [provider]  NOVOLOG FLEXPEN 100 UNIT/ML FlexPen Inject 0-10 Units into the skin 3 (three) times daily with meals.  07/02/20   [provider]  pantoprazole (PROTONIX) 40 MG tablet Take 40 mg by mouth 2 (two) times daily. 07/03/20   [provider]  promethazine (PHENERGAN) 25 MG tablet Take 1 tablet (25 mg total) by  mouth every 6 (six) hours as needed for nausea or vomiting. Patient not taking: Reported on 07/04/2020 05/04/17   Vanetta Mulders, MD  promethazine-dextromethorphan (PROMETHAZINE-DM) 6.25-15 MG/5ML syrup Take 5 mLs by mouth 4 (four) times daily as needed for cough. Patient not taking: Reported on 07/04/2020 08/06/17   Rolland Porter, MD  rizatriptan (MAXALT) 10 MG tablet Take 10 mg by mouth as needed for migraine. May repeat in 2 hours if needed    [provider]  sertraline (ZOLOFT) 50 MG tablet Take 50 mg by mouth daily. 06/13/20   [provider]  sucralfate (CARAFATE) 1 GM/10ML suspension Take 10 mLs (1 g total) by mouth 4 (four) times daily -  with meals and at bedtime. Patient not taking: Reported on 07/04/2020 06/09/17   Palumbo, April, MD  timolol (TIMOPTIC) 0.5 % ophthalmic solution Place 1 drop into both eyes 2 (two) times daily.  04/06/20   [provider]  valsartan (DIOVAN) 320 MG tablet Take 320 mg by mouth daily. 07/04/20   [provider]  zonisamide (ZONEGRAN) 100 MG capsule Take 200 mg by mouth 2 (two) times daily.  05/17/20   [provider]    Allergies    Nsaids, Amoxicillin, Ampicillin, Penicillins, Strawberry extract, Asa [aspirin], Bactrim [sulfamethoxazole-trimethoprim], Depakote [divalproex sodium], Dilantin [phenytoin], Levothyroxine, Methocarbamol, Risperidone and related, Sitagliptin, Strawberry (diagnostic), Tolmetin, Ultram [tramadol hcl], and Oatmeal  Review of Systems   Review of Systems  Unable to perform ROS: Patient nonverbal    Physical Exam Updated Vital Signs BP (!) 111/88   Pulse 81   Temp (!) 97 F (36.1 C) (Temporal)   Resp 18   Wt 86 kg   SpO2 96%   BMI 29.69 kg/m   Physical Exam Vitals and nursing note reviewed.  Constitutional:      Appearance: He is well-developed.  HENT:     Head: Normocephalic and atraumatic.  Eyes:     Conjunctiva/sclera: Conjunctivae normal.  Cardiovascular:     Rate and Rhythm:  Normal rate and regular rhythm.     Heart sounds: No murmur heard.   Pulmonary:     Effort: Pulmonary effort is normal. No respiratory distress.     Breath sounds: Normal breath sounds.  Abdominal:     Palpations: Abdomen is soft.     Tenderness: There is no abdominal tenderness.  Musculoskeletal:     Cervical back: Neck supple.  Skin:    General: Skin is warm and dry.  Neurological:     Mental Status: He is alert.     Comments: Alert, follows commands Cranial nerves II through XII intact except noted left facial droop, speech slurred Left upper and lower extremity weakness Right upper and lower extremity strength intact        ED Results / Procedures / Treatments   Labs (all labs ordered are listed, but only abnormal results are displayed) Labs Reviewed  CBC - Abnormal; Notable for the following components:      Result Value   RDW 17.2 (*)    All other components within normal limits  COMPREHENSIVE METABOLIC PANEL - Abnormal; Notable for the following components:   CO2 20 (*)    Glucose, Bld 170 (*)    BUN 32 (*)    Creatinine, Ser 1.79 (*)    GFR calc non Af Amer 41 (*)    GFR calc Af Amer 47 (*)    All other components within normal limits  HEMOGLOBIN A1C - Abnormal; Notable for the following components:   Hgb A1c MFr Bld 6.7 (*)    All other components within normal limits  CBG MONITORING, ED - Abnormal; Notable for the following components:   Glucose-Capillary 167 (*)    All other components within normal limits  I-STAT CHEM 8, ED - Abnormal; Notable for the following components:   BUN 34 (*)    Creatinine, Ser 1.80 (*)    Glucose, Bld 166 (*)    TCO2 19 (*)    All other components within normal limits  SARS CORONAVIRUS 2 BY RT PCR (HOSPITAL ORDER, PERFORMED IN [facility])  PROTIME-INR  APTT  DIFFERENTIAL  HIV ANTIBODY (ROUTINE TESTING W REFLEX)  TSH  T4, FREE  RAPID URINE DRUG SCREEN, HOSP PERFORMED  CBG MONITORING, ED     EKG None  Radiology CT Code Stroke CTA Head W/WO contrast  Result Date: 07/04/2020 CLINICAL DATA:  Code stroke EXAM: CT HEAD WITHOUT CONTRAST CT ANGIOGRAPHY OF THE HEAD AND NECK TECHNIQUE: Contiguous axial images were obtained from the base of the skull through the vertex without intravenous contrast. Multidetector CT imaging of the head and neck was performed using the standard protocol during bolus administration of intravenous contrast. Multiplanar CT image reconstructions and MIPs were obtained to evaluate the vascular anatomy. Carotid stenosis measurements (when applicable) are obtained utilizing NASCET criteria, using the distal internal carotid diameter as the denominator. CONTRAST:  100 mL Omnipaque 350 COMPARISON:  05/03/2020 FINDINGS: CT HEAD Brain: There is no acute intracranial hemorrhage, mass effect, or edema. Gray-white differentiation is preserved. There is no extra-axial fluid collection. Ventricles and sulci are within normal limits in size and configuration. Vascular: No hyperdense vessel or unexpected calcification. Skull: Calvarium is unremarkable. Sinuses/Orbits: No acute finding. Other: None. Review of the MIP images confirms the above findings CTA NECK Aortic arch: Great vessel origins are patent. There is direct origin of the left vertebral artery from the arch. Right carotid system: Patent. No measurable stenosis or evidence of dissection. Left carotid system: Patent. No measurable stenosis or evidence of dissection. Vertebral arteries: Patent.  Right vertebral artery is dominant. Skeleton: Postoperative changes of anterior fusion at C4-C7. Degenerative changes of the cervical spine. Other neck: No mass or adenopathy. Upper chest:  No apical lung mass. Review of the MIP images confirms the above findings CTA HEAD Anterior circulation: Intracranial internal carotid arteries are patent. Anterior and middle cerebral arteries are patent. Posterior circulation: Intracranial vertebral  arteries, basilar artery, and posterior cerebral arteries are patent. A right posterior communicating artery is present. Venous sinuses: Patent as allowed by contrast bolus timing. Review of the MIP images confirms the above findings IMPRESSION: No acute intracranial hemorrhage or evidence of acute infarction. ASPECT score is 10. No large vessel occlusion or hemodynamically significant stenosis. These results were communicated to Dr. Otelia LimesLindzen at 12:24 pmon 7/29/2021by text page via the [facility] messaging system. Electronically Signed   By: Guadlupe SpanishPraneil  Patel M.D.   On: 07/04/2020 12:33   CT Code Stroke CTA Neck W/WO contrast  Result Date: 07/04/2020 CLINICAL DATA:  Code stroke EXAM: CT HEAD WITHOUT CONTRAST CT ANGIOGRAPHY OF THE HEAD AND NECK TECHNIQUE: Contiguous axial images were obtained from the base of the skull through the vertex without intravenous contrast. Multidetector CT imaging of the head and neck was performed using the standard protocol during bolus administration of intravenous contrast. Multiplanar CT image reconstructions and MIPs were obtained to evaluate the vascular anatomy. Carotid stenosis measurements (when applicable) are obtained utilizing NASCET criteria, using the distal internal carotid diameter as the denominator. CONTRAST:  100 mL Omnipaque 350 COMPARISON:  05/03/2020 FINDINGS: CT HEAD Brain: There is no acute intracranial hemorrhage, mass effect, or edema. Gray-white differentiation is preserved. There is no extra-axial fluid collection. Ventricles and sulci are within normal limits in size and configuration. Vascular: No hyperdense vessel or unexpected calcification. Skull: Calvarium is unremarkable. Sinuses/Orbits: No acute finding. Other: None. Review of the MIP images confirms the above findings CTA NECK Aortic arch: Great vessel origins are patent. There is direct origin of the left vertebral artery from the arch. Right carotid system: Patent. No measurable stenosis or evidence of  dissection. Left carotid system: Patent. No measurable stenosis or evidence of dissection. Vertebral arteries: Patent.  Right vertebral artery is dominant. Skeleton: Postoperative changes of anterior fusion at C4-C7. Degenerative changes of the cervical spine. Other neck: No mass or adenopathy. Upper chest: No apical lung mass. Review of the MIP images confirms the above findings CTA HEAD Anterior circulation: Intracranial internal carotid arteries are patent. Anterior and middle cerebral arteries are patent. Posterior circulation: Intracranial vertebral arteries, basilar artery, and posterior cerebral arteries are patent. A right posterior communicating artery is present. Venous sinuses: Patent as allowed by contrast bolus timing. Review of the MIP images confirms the above findings IMPRESSION: No acute intracranial hemorrhage or evidence of acute infarction. ASPECT score is 10. No large vessel occlusion or hemodynamically significant stenosis. These results were communicated to Dr. Otelia LimesLindzen at 12:24 pmon 7/29/2021by text page via the [facility] system. Electronically Signed   By: Guadlupe SpanishPraneil  Patel M.D.   On: 07/04/2020 12:33   MR BRAIN WO CONTRAST  Result Date: 07/04/2020 CLINICAL DATA:  Neuro deficit, acute, stroke suspected. Additional history obtained from electronic MEDICAL RECORD NUMBERSyncope, patient found to have slurred speech and left-sided weakness, patient with seizure history. EXAM: MRI HEAD WITHOUT CONTRAST TECHNIQUE: Multiplanar, multiecho pulse sequences of the brain and surrounding structures were obtained without intravenous contrast. COMPARISON:  Noncontrast head CT and CT angiogram head/neck performed earlier the same day, brain MRI 09/16/2018 FINDINGS: Brain: Cerebral volume is normal for age. No focal parenchymal signal abnormality is identified. There is no acute infarct. No evidence of intracranial mass. No chronic intracranial blood products. No extra-axial fluid  collection. No midline  shift. Vascular: Expected proximal arterial flow voids. Skull and upper cervical spine: There redemonstrated nonspecific heterogeneous marrow of the visualized upper cervical spine. No focal suspicious marrow lesion. Sinuses/Orbits: Visualized orbits show no acute finding. Trace ethmoid sinus mucosal thickening. No significant mastoid effusion IMPRESSION: No evidence of acute intracranial abnormality, including acute infarction. Redemonstrated nonspecific heterogeneous marrow signal within the visualized upper cervical spine without focal suspicious osseous lesion. Minimal ethmoid sinus mucosal thickening. Electronically Signed   By: Jackey Loge DO   On: 07/04/2020 13:17   CT HEAD CODE STROKE WO CONTRAST  Result Date: 07/04/2020 CLINICAL DATA:  Code stroke EXAM: CT HEAD WITHOUT CONTRAST CT ANGIOGRAPHY OF THE HEAD AND NECK TECHNIQUE: Contiguous axial images were obtained from the base of the skull through the vertex without intravenous contrast. Multidetector CT imaging of the head and neck was performed using the standard protocol during bolus administration of intravenous contrast. Multiplanar CT image reconstructions and MIPs were obtained to evaluate the vascular anatomy. Carotid stenosis measurements (when applicable) are obtained utilizing NASCET criteria, using the distal internal carotid diameter as the denominator. CONTRAST:  100 mL Omnipaque 350 COMPARISON:  05/03/2020 FINDINGS: CT HEAD Brain: There is no acute intracranial hemorrhage, mass effect, or edema. Gray-white differentiation is preserved. There is no extra-axial fluid collection. Ventricles and sulci are within normal limits in size and configuration. Vascular: No hyperdense vessel or unexpected calcification. Skull: Calvarium is unremarkable. Sinuses/Orbits: No acute finding. Other: None. Review of the MIP images confirms the above findings CTA NECK Aortic arch: Great vessel origins are patent. There is direct origin of the left vertebral  artery from the arch. Right carotid system: Patent. No measurable stenosis or evidence of dissection. Left carotid system: Patent. No measurable stenosis or evidence of dissection. Vertebral arteries: Patent.  Right vertebral artery is dominant. Skeleton: Postoperative changes of anterior fusion at C4-C7. Degenerative changes of the cervical spine. Other neck: No mass or adenopathy. Upper chest: No apical lung mass. Review of the MIP images confirms the above findings CTA HEAD Anterior circulation: Intracranial internal carotid arteries are patent. Anterior and middle cerebral arteries are patent. Posterior circulation: Intracranial vertebral arteries, basilar artery, and posterior cerebral arteries are patent. A right posterior communicating artery is present. Venous sinuses: Patent as allowed by contrast bolus timing. Review of the MIP images confirms the above findings IMPRESSION: No acute intracranial hemorrhage or evidence of acute infarction. ASPECT score is 10. No large vessel occlusion or hemodynamically significant stenosis. These results were communicated to Dr. Otelia Limes at 12:24 pmon 7/29/2021by text page via the [facility] messaging system. Electronically Signed   By: Guadlupe Spanish M.D.   On: 07/04/2020 12:33    Procedures Procedures (including critical care time)  Medications Ordered in ED Medications  sodium chloride flush (NS) 0.9 % injection 3 mL (3 mLs Intravenous Not Given 07/04/20 1452)  enoxaparin (LOVENOX) injection 40 mg (40 mg Subcutaneous Given 07/04/20 1449)  sodium chloride flush (NS) 0.9 % injection 3 mL (3 mLs Intravenous Given 07/04/20 1452)  acetaminophen (TYLENOL) tablet 650 mg (650 mg Oral Given 07/04/20 1449)    Or  acetaminophen (TYLENOL) suppository 650 mg ( Rectal See Alternative 07/04/20 1449)  ondansetron (ZOFRAN) tablet 4 mg (has no administration in time range)    Or  ondansetron (ZOFRAN) injection 4 mg (has no administration in time range)  albuterol (PROVENTIL) (2.5  MG/3ML) 0.083% nebulizer solution 2.5 mg (has no administration in time range)  clopidogrel (PLAVIX) tablet 75 mg (75 mg  Oral Given 07/04/20 1449)  atorvastatin (LIPITOR) tablet 80 mg (80 mg Oral Given 07/04/20 1449)  insulin aspart (novoLOG) injection 0-5 Units (has no administration in time range)  insulin aspart (novoLOG) injection 0-15 Units (has no administration in time range)  iohexol (OMNIPAQUE) 350 MG/ML injection 75 mL (75 mLs Intravenous Contrast Given 07/04/20 1232)  0.9 %  sodium chloride infusion ( Intravenous New Bag/Given 07/04/20 1452)    ED Course  I have reviewed the triage vital signs and the nursing notes.  Pertinent labs & imaging results that were available during my care of the patient were reviewed by me and considered in my medical decision making (see chart for details).    MDM Rules/Calculators/A&P                          59 year old male presents to ER with concern for slurred speech, left-sided weakness, stroke alert.  Had seizure-like episode in ER.  CTA CT head negative.  Based on current clinical picture and prior neurologic assessment, Dr. Otelia Limes with neurology has very strong suspicion for functional/nonphysiological deficit.  Recommends against TPA.  Recommended obtaining MRI, EEG, admitting to hospitalist for further observation.  Consulted Dr. Katrinka Blazing who will accept.   Final Clinical Impression(s) / ED Diagnoses Final diagnoses:  Stroke-like episode  Seizure-like activity [facility])    Rx / DC Orders ED Discharge Orders    None       Milagros Loll, MD 07/04/20 1614

## 2020-07-05 DIAGNOSIS — R531 Weakness: Principal | ICD-10-CM

## 2020-07-05 LAB — CBC
HCT: 41 % (ref 39.0–52.0)
Hemoglobin: 12.8 g/dL — ABNORMAL LOW (ref 13.0–17.0)
MCH: 26.7 pg (ref 26.0–34.0)
MCHC: 31.2 g/dL (ref 30.0–36.0)
MCV: 85.6 fL (ref 80.0–100.0)
Platelets: UNDETERMINED 10*3/uL (ref 150–400)
RBC: 4.79 MIL/uL (ref 4.22–5.81)
RDW: 17.2 % — ABNORMAL HIGH (ref 11.5–15.5)
WBC: 4.4 10*3/uL (ref 4.0–10.5)
nRBC: 0 % (ref 0.0–0.2)

## 2020-07-05 LAB — GLUCOSE, CAPILLARY
Glucose-Capillary: 113 mg/dL — ABNORMAL HIGH (ref 70–99)
Glucose-Capillary: 136 mg/dL — ABNORMAL HIGH (ref 70–99)
Glucose-Capillary: 145 mg/dL — ABNORMAL HIGH (ref 70–99)
Glucose-Capillary: 212 mg/dL — ABNORMAL HIGH (ref 70–99)

## 2020-07-05 LAB — BASIC METABOLIC PANEL
Anion gap: 9 (ref 5–15)
BUN: 22 mg/dL — ABNORMAL HIGH (ref 6–20)
CO2: 20 mmol/L — ABNORMAL LOW (ref 22–32)
Calcium: 9.9 mg/dL (ref 8.9–10.3)
Chloride: 109 mmol/L (ref 98–111)
Creatinine, Ser: 1.2 mg/dL (ref 0.61–1.24)
GFR calc Af Amer: >60 mL/min (ref >60)
GFR calc non Af Amer: >60 mL/min (ref >60)
Glucose, Bld: 95 mg/dL (ref 70–99)
Potassium: 4.3 mmol/L (ref 3.5–5.1)
Sodium: 138 mmol/L (ref 135–145)

## 2020-07-05 LAB — HIV ANTIBODY (ROUTINE TESTING W REFLEX): HIV Screen 4th Generation wRfx: NONREACTIVE

## 2020-07-05 LAB — T4, FREE: Free T4: 0.73 ng/dL (ref 0.61–1.12)

## 2020-07-05 LAB — TSH: TSH: 4.244 u[IU]/mL (ref 0.350–4.500)

## 2020-07-05 MED ORDER — LEVETIRACETAM 500 MG PO TABS
1000.0000 mg | ORAL_TABLET | Freq: Two times a day (BID) | ORAL | Status: DC
  Administered 2020-07-05 – 2020-07-06 (×2): 1000 mg via ORAL
  Filled 2020-07-05 (×2): qty 2

## 2020-07-05 MED ORDER — GABAPENTIN 300 MG PO CAPS: 900.0000 mg | ORAL_CAPSULE | Freq: Every day | ORAL | Status: DC

## 2020-07-05 MED ORDER — ZONISAMIDE 100 MG PO CAPS
200.0000 mg | ORAL_CAPSULE | Freq: Two times a day (BID) | ORAL | Status: DC
  Administered 2020-07-05 – 2020-07-06 (×2): 200 mg via ORAL
  Filled 2020-07-05 (×2): qty 2

## 2020-07-05 MED ORDER — HUMALOG KWIKPEN 200 UNIT/ML ~~LOC~~ SOPN: 0.0000 [IU] | PEN_INJECTOR | Freq: Three times a day (TID) | SUBCUTANEOUS | Status: DC

## 2020-07-05 MED ORDER — CLOPIDOGREL BISULFATE 75 MG PO TABS
75.0000 mg | ORAL_TABLET | Freq: Every day | ORAL | Status: DC
Start: 2020-07-05 — End: 2023-04-12

## 2020-07-05 MED ORDER — METFORMIN HCL 1000 MG PO TABS: 1000.0000 mg | ORAL_TABLET | Freq: Two times a day (BID) | ORAL | Status: DC

## 2020-07-05 MED ORDER — LEVETIRACETAM 1000 MG PO TABS: 1000.0000 mg | ORAL_TABLET | Freq: Two times a day (BID) | ORAL | Status: DC

## 2020-07-05 MED FILL — HUMALOG 200 UNITS/ML KWIKPE: 200 | 20 days supply | Qty: 6 | Fill #0

## 2020-07-05 MED FILL — CLOPIDOGREL 75 MG TABLET: 75 | 30 days supply | Qty: 30 | Fill #0

## 2020-07-05 MED FILL — GABAPENTIN 300 MG CAPSULE: 300 | 30 days supply | Qty: 90 | Fill #0

## 2020-07-05 MED FILL — PENTIPS 32G X 4 MM MISC: 32G X 4 MM | 25 days supply | Qty: 100 | Fill #0

## 2020-07-05 MED FILL — levETIRAcetam 500 MG TABS: 500 | 30 days supply | Qty: 120 | Fill #0

## 2020-07-05 MED FILL — METFORMIN HCL 1000 MG TABS: 1000 | 30 days supply | Qty: 60 | Fill #0

## 2020-07-05 NOTE — Evaluation (Addendum)
Occupational Therapy Evaluation Patient Details Name: Nicholas [name] MRN: [id number] DOB: 22-Apr-1961 Today's Date: 07/05/2020    History of Present Illness Patient is a 59 y/o male who presents with left sided weakness and an episode of LUE jerking with blank stare for ~30 seconds, per MD consistent with embellishment. Rule out seizure vs complex migraine vs somatization vs another cause?[name] PMH includes DM, TIA, seizures, HTN, glaucome, bipolar disorder.   Clinical Impression   PTA pt reports being independence with ADLs and needing assist for IADLs - waiting on cane for mobility. Pt was admitted for above and treated for problem list below (see OT Problem List). Upon arrival pt A&Ox4 with speech jumbled and slurred. Towards the end of the session pt expressed he lost his wife and their anniversary/her birthday is coming up and he has hx of substance abuse. Pt reported he couldnt lift arm but then scratching his head at end of session with L arm. Speech cleared up towards the end of the session. Unable to remember who OT was but was able to remember asking OT about obtaining pain medicine. Requires supervision to total A for ADLs due to due to weakness, decreased cognition, decreased coordination and pain. Requires Mod A with transfers +1 and Min A +2 for mobility progression due to decreased coordination and balance. Believe pt would benefit from skilled OT services acutely and at the [facility] level only if pt has 24/7 supervision/assist to increase safety and independence.    Follow Up Recommendations  Home Health OT;Supervision/Assistance - 24 hour    Equipment Recommendations  Other (comment) (TBD at next venue of care)       Precautions / Restrictions Precautions Precautions: Fall Restrictions Weight Bearing Restrictions: No      Mobility Bed Mobility Overal bed mobility: Needs Assistance Bed Mobility: Supine to Sit;Sit to Supine     Supine to sit: Supervision Sit to supine:  Supervision   General bed mobility comments: supervision for safety  Transfers Overall transfer level: Needs assistance Equipment used: 2 person hand held assist Transfers: Sit to/from Stand Sit to Stand: Min assist;+2 physical assistance         General transfer comment: Assist of 2 to stand from EOB    Balance Overall balance assessment: Needs assistance Sitting-balance support: Feet supported;No upper extremity supported Sitting balance-Leahy Scale: Good     Standing balance support: During functional activity Standing balance-Leahy Scale: Poor Standing balance comment: requires BUE assist                           ADL either performed or assessed with clinical judgement   ADL Overall ADL's : Needs assistance/impaired     Grooming: Wash/dry face;Supervision/safety;Sitting   Upper Body Bathing: Minimal assistance;Sitting   Lower Body Bathing: Supervison/ safety;Sitting/lateral leans Lower Body Bathing Details (indicate cue type and reason): pt able to reach toes to pull up socks Upper Body Dressing : Minimal assistance;Sitting   Lower Body Dressing: Sit to/from stand;Sitting/lateral leans;Total assistance;+2 for physical assistance Lower Body Dressing Details (indicate cue type and reason): dependent on BUE support Toilet Transfer: Minimal assistance;+2 for physical assistance;Comfort height toilet;Ambulation (HHA) Toilet Transfer Details (indicate cue type and reason): Pt able to ambulate to bathroom door with Min A +2 HHA  Toileting- Clothing Manipulation and Hygiene: Supervision/safety;Sitting/lateral lean;Sit to/from stand;Minimal assistance Toileting - Clothing Manipulation Details (indicate cue type and reason): supervision with sitting and min A for standing   Tub/Shower Transfer Details (indicate  cue type and reason): deferred due to pt safety Functional mobility during ADLs: Minimal assistance;+2 for physical assistance General ADL Comments: Pt  with L arm weakness and decreased cognition      Vision Baseline Vision/History: Glaucoma Patient Visual Report: No change from baseline              Pertinent Vitals/Pain Pain Assessment: Faces Faces Pain Scale: Hurts little more Pain Location: L side exremities Pain Descriptors / Indicators: Discomfort;Burning;Tingling Pain Intervention(s): Monitored during session;Repositioned     Hand Dominance Right   Extremity/Trunk Assessment Upper Extremity Assessment Upper Extremity Assessment: LUE deficits/detail LUE Deficits / Details: Pt stating he is unable to lift arm only bend elbow and wrist. Pt able to scratch head with L arm 3/5 MMT at all joints LUE Sensation: decreased light touch LUE Coordination: decreased fine motor;decreased gross motor   Lower Extremity Assessment Lower Extremity Assessment: Defer to PT evaluation LLE Deficits / Details: Giveway weakness with quads, hip flexors and ankle DF with pain; reports burning sensation entire LE. No difficulty with advancing LLE and ankle DF during gait, just exaggerated knee weakness. LLE Sensation:  (burning)   Cervical / Trunk Assessment Cervical / Trunk Assessment: Normal   Communication Communication Communication: Expressive difficulties   Cognition Arousal/Alertness: Awake/alert Behavior During Therapy: WFL for tasks assessed/performed Overall Cognitive Status: Impaired/Different from baseline Area of Impairment: Memory;Safety/judgement;Awareness;Problem solving                     Memory: Decreased short-term memory   Safety/Judgement: Decreased awareness of deficits;Decreased awareness of safety Awareness: Intellectual Problem Solving: Slow processing;Requires verbal cues General Comments: upon arrival pt A&Ox4 with speech jumbled and slurred. Towards the end of the session pt expressed he lost his wife and their anniversary/her birthdsy is coming up and he has hx of substance abuse. Pt reported he  couldnt lift arm but then scratching his head at end of session with L arm. Speech cleared up towards thwe end of the session. 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 Participating in a drug recovery program called [facility]? Supposed to be moving into housing in ~2 weeks awaiting inspection. Walks independently at baseline.        OT Problem List: Decreased strength;Decreased range of motion;Decreased activity tolerance;Impaired balance (sitting and/or standing);Decreased coordination;Decreased cognition;Decreased safety awareness;Decreased knowledge of use of DME or AE;Decreased knowledge of precautions;Pain;Impaired sensation      OT Treatment/Interventions: Self-care/ADL training;Neuromuscular education;DME and/or AE instruction;Therapeutic activities;Cognitive remediation/compensation;Patient/family education;Balance training    OT Goals(Current goals can be found in the care plan section) Acute Rehab OT Goals Patient Stated Goal: to get into housing OT Goal Formulation: With patient Time For Goal Achievement: 07/19/20 Potential to Achieve Goals: Good  OT Frequency: Min 2X/week  Barriers to D/C: Inaccessible home environment;Decreased caregiver support          Co-evaluation PT/OT/SLP Co-Evaluation/Treatment: Yes Reason for  Co-Treatment: Complexity of the patient's impairments (multi-system involvement);Necessary to address cognition/behavior during functional activity;For patient/therapist safety;To address functional/ADL transfers PT goals addressed during session: Mobility/safety with mobility;Balance;Strengthening/ROM OT goals addressed during session: ADL's and self-care;Strengthening/ROM      AM-PAC OT "6 Clicks" Daily Activity     Outcome Measure Help from another person eating meals?: None Help from another person taking care of personal grooming?: A Little Help from another person toileting, which includes using toliet, bedpan, or urinal?: A Lot Help from another person bathing (including washing, rinsing, drying)?: A Lot Help from another person to put on and taking off regular upper body clothing?: A Little Help from another person to put on and taking off regular lower body clothing?: Total 6 Click Score: 15   End of Session Equipment Utilized During Treatment: Gait belt Nurse Communication: Mobility status  Activity Tolerance: Patient tolerated treatment well Patient left: in bed;with call bell/phone within reach;with bed alarm set  OT Visit Diagnosis: Unsteadiness on feet (R26.81);Other abnormalities of gait and mobility (R26.89);Muscle weakness (generalized) (M62.81);Other symptoms and signs involving the nervous system (R29.898);Other symptoms and signs involving cognitive function;Pain Pain - Right/Left: Left Pain - part of body:  (side tingling/burning)                Time: 5885-0277 OT Time Calculation (min): 29 min Charges:  OT General Charges $OT Visit: 1 Visit OT Evaluation $OT Eval Moderate Complexity: 1 Mod  [place] Kuang/OTS  Asaf Elmquist 07/05/2020, 11:08 AM

## 2020-07-05 NOTE — TOC Initial Note (Signed)
Transition of Care [facility]) - Initial/Assessment Note    Patient Details  Name: Nicholas [name] MRN: [id number] Date of Birth: 10-13-1961  Transition [facility] CM/SW Contact:    Kermit Balo, RN Phone Number: 07/05/2020, 2:34 PM  Clinical Narrative:                 Pt is currently living in [facility] but states that Shelly Coss has him an apartment to live in starting in a week and 1/2. Pt states he has been trying to arrange delivery of his medications with his pharmacy but keeps running into barriers. States he is out of a few medications at home. CM has updated the MD so TOC can fill and he will have at d/c.  Pt uses [facility] transportation for needed transport.  PCP: Dr Eulah Pont at [facility] Recommendations for walker. CM has updated Zack with [employer] and they will deliver to the room.  HH services arranged through [facility]. Lupita Leash with [facility] has accepted the referral.  TOC following.  Expected Discharge Plan: Home w Home Health Services Barriers to Discharge: Continued Medical Work up   Patient Goals and CMS Choice   CMS Medicare.gov Compare Post Acute Care list provided to:: Patient Choice offered to / list presented to : Patient  Expected Discharge Plan and Services Expected Discharge Plan: Home w Home Health Services   Discharge Planning Services: CM Consult Post Acute Care Choice: Home Health, Durable Medical Equipment Living arrangements for the past 2 months: Hotel/Motel                 DME Arranged: Dan Humphreys rolling DME Agency: [facility] Date DME Agency Contacted: 07/05/20   Representative spoke with at DME Agency: Zack HH Arranged: PT, OT, Speech Therapy HH Agency: [facility] Date HH Agency Contacted: 07/05/20   Representative spoke with at [facility] Agency: Lupita Leash  Prior Living Arrangements/Services Living arrangements for the past 2 months: Hotel/Motel Lives with:: Self Patient language and need for  interpreter reviewed:: Yes Do you feel safe going back to the place where you live?: Yes      Need for Family Participation in Patient Care:  (potential need for supervision with ambulation) Care giver support system in place?: No (comment)   Criminal Activity/Legal Involvement Pertinent to Current Situation/Hospitalization: No - Comment as needed  Activities of Daily Living      Permission Sought/Granted                  Emotional Assessment   Attitude/Demeanor/Rapport: Engaged Affect (typically observed): Accepting Orientation: : Oriented to Self, Oriented to Place, Oriented to  Time, Oriented to Situation   Psych Involvement: No (comment)  Admission diagnosis:  Left-sided weakness [R53.1] Seizure-like activity (HCC) [R56.9] Stroke-like episode [R29.90] Patient Active Problem List   Diagnosis Date Noted   Left-sided weakness 07/04/2020   Dysarthria 07/04/2020   Essential hypertension    Seizure disorder (HCC) 07/02/2017   Insulin-requiring or dependent type II diabetes mellitus (HCC) 07/02/2017   CKD (chronic kidney disease), stage III 07/02/2017   Normocytic anemia 07/02/2017   Testicular/scrotal pain 11/09/2013   Microhematuria 11/09/2013   Condyloma acuminatum of scrotum 11/09/2013   PCP:  Patient, No Pcp Per Pharmacy:   [id number] - [place], [place] - [address] [place] [address] Phone: [phone] Fax: [phone]  [facility] [place], [place] -  [address] [place] [address] Phone: [phone] Fax: [phone]     Social Determinants of Health (SDOH) Interventions    Readmission Risk Interventions No flowsheet data found.

## 2020-07-05 NOTE — Evaluation (Signed)
Clinical/Bedside Swallow Evaluation Patient Details  Name: Nicholas [name] MRN: [id number] Date of Birth: Jul 23, 1961  Today's Date: 07/05/2020 Time: SLP Start Time (ACUTE ONLY): 0931 SLP Stop Time (ACUTE ONLY): 0943 SLP Time Calculation (min) (ACUTE ONLY): 12 min  Past Medical History:  Past Medical History:  Diagnosis Date  . Asthma   . Bipolar 1 disorder (HCC)   . Borderline glaucoma   . Chronic pain   . Epididymal pain    LEFT  . 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 Feeling of incomplete bladder emptying   . Frequency of urination   . Gastric ulcer   . GERD (gastroesophageal reflux disease)   . Hypertension   . Hyperthyroidism    NO MEDS   . Migraine   . Seizures (HCC)   . TIA (transient ischemic attack)   . Type 2 diabetes mellitus (HCC)    Past Surgical History:  Past Surgical History:  Procedure Laterality Date  . ABDOMINAL SURGERY    . ANTERIOR CERVICAL DECOMP/DISCECTOMY FUSION  2007   C4  --  C6  . CERVICAL FUSION    . CHOLECYSTECTOMY    . CYSTOSCOPY N/A 11/09/2013   Procedure: CYSTOSCOPY FLEXIBLE;  Surgeon: Bjorn Pippin, MD;  Location: [facility];  Service: Urology;  Laterality: N/A;  . EPIDIDYMECTOMY Left 11/09/2013   Procedure:  LEFT EPIDIDYMECTOMY;  Surgeon: Bjorn Pippin, MD;  Location: [facility];  Service: Urology;  Laterality: Left;  POSSIBLE OUTPATIENT WITH OBSERVATION  . EXCISION LIPOMA LEFT SHOULDER  2004 (APPROX)  . MULTIPLE CYST REMOVED FROM CHEST  AGE 70  . OTHER SURGICAL HISTORY     hemorroid surgery   . TESTICLE REMOVAL Left   . TONSILLECTOMY     HPI:  Nicholas [name] is a 59 y.o. male with medical history significant of hypertension, hyperthyroidism, DM type II, bipolar 1 disorder, migraine headaches, seizure disorder, and history of COVID-19 infection in 2020 presented with complaints of left-sided weakness and slurred  speech.  Pt with witnessed 30s seizure in ED.  MRI 7/30 with no acute findings.   Assessment / Plan / Recommendation Clinical Impression  Pt presents with functional swallowing as assessed clinically.  Pt tolerated all consistencies trialed.  There was single delayed cough, seemingly unrelated to PO intake.  Pt reports it feel like his throat was closing up on him.  Endorsed feeling of spasm, but reports not feeling of food/drink going down the wrong way.  There were no other clinical s/s of aspiration noted during the evaluation.  Pt exhibited good oral clearance of puree and solids.  Recommend continuing regular texture diet with thin liquids.  Pt has no further swallowing needs at this time. SLP Visit Diagnosis: Dysphagia, unspecified (R13.10)    Aspiration Risk  No limitations    Diet Recommendation Regular;Thin liquid   Liquid Administration via: Spoon;Cup Medication Administration: Whole meds with liquid Supervision: Patient able to self feed Compensations: Slow rate;Small sips/bites Postural Changes: Seated upright at 90 degrees    Other  Recommendations Oral Care Recommendations: Oral care BID   Follow up Recommendations None      Frequency and Duration            Prognosis        Swallow Study   General Date of Onset: 07/05/20 HPI: Nicholas [name] is a 59 y.o.  male with medical history significant of hypertension, hyperthyroidism, DM type II, bipolar 1 disorder, migraine headaches, seizure disorder, and history of COVID-19 infection in 2020 presented with complaints of left-sided weakness and slurred speech.  Pt with witnessed 30s seizure in ED.  MRI 7/30 with no acute findings. Type of Study: Bedside Swallow Evaluation Diet Prior to this Study: Regular Temperature Spikes Noted: No Respiratory Status: Room air History of Recent Intubation: No Behavior/Cognition: Alert;Cooperative;Pleasant mood Oral Cavity Assessment: Within Functional Limits Oral Cavity - Dentition:  Adequate natural dentition Self-Feeding Abilities: Able to feed self Patient Positioning: Upright in bed Baseline Vocal Quality: Hoarse Volitional Cough: Strong Volitional Swallow: Able to elicit    Oral/Motor/Sensory Function Overall Oral Motor/Sensory Function: Mild impairment Facial ROM: Reduced left Facial Symmetry: Abnormal symmetry left Lingual ROM: Within Functional Limits Lingual Symmetry: Within Functional Limits Lingual Strength: Within Functional Limits Velum: Within Functional Limits Mandible: Within Functional Limits   Ice Chips Ice chips: Not tested   Thin Liquid Thin Liquid: Within functional limits Presentation: Cup;Straw    Nectar Thick Nectar Thick Liquid: Not tested   Honey Thick Honey Thick Liquid: Not tested   Puree Puree: Within functional limits Presentation: Spoon   Solid     Solid: Within functional limits Presentation: Self Fed      Kerrie Pleasure, MA, CCC-SLP Acute Rehabilitation Services Office: [phone] 07/05/2020,1:13 PM

## 2020-07-05 NOTE — Progress Notes (Signed)
PROGRESS NOTE    Nicholas [name]  QIO:[id number] DOB: 20-Aug-1961 DOA: 07/04/2020 PCP: Patient, No Pcp Per   Brief Narrative:  Nicholas [name] is a 59 y.o. male with medical history significant of hypertension, hyperthyroidism, DM type II, bipolar 1 disorder, migraine headaches, seizure disorder, and history of COVID-19 infection in 2020 presented with complaints of left-sided weakness and slurred speech.  Records note EMS was called after a home health nurse came by to see the patient and when the patient opened the door he slumped over to the side and passed out.  It is not clear the patient was out.  Despite the change in his speech he reports knowing what he wants to say, but is having a difficult time getting it out.Nicholas [name]  He can move his arm some, but is unable to move his left leg at all.  Notes associated symptoms of a severe left-sided headache, numbness/tingling, change in vision in left eye, and currently lumbar back pain.  Denies having any chest pain, recent fever, sick contacts, palpitations, abdominal pain, nausea, vomiting, diarrhea, or drug use. Review of records shows previous hospitalizations in 08/2018 with left-sided weakness for which patient received TPA, but MRI was normal.  Last hospitalization on 03/07/2020 with right-sided weakness there was a question of possible pseudoseizures as patient reportedly had spells with normal EEG question possibility of somatizations. In ED he was seen as a code stroke upon entering the emergency department and was evaluated by neurology.  Witnessed having diffuse limb jerking with blank stare lasting approximately 30 seconds patient was noted to be dysarthric.  CT scan of the brain showed no acute abnormalities.  Patient was not deemed a TPA candidate.  His vital signs were noted to be relatively stable. Labs significant for BUN 32, creatinine 1.79, and glucose 170.  Subsequently MRI of the brain did not show any acute abnormalities.  COVID-19 screening was  still pending.   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 - Questionably Todd's paralysis versus complex migraine given reported headache at admission - Continue Plavix and atorvastatin per neurology recommendations - PT/OT/speech to eval and treat - Appreciate neurology consultative services, follow-up for any further recommendations  Acute breakthrough seizure, likely in the setting of medication noncompliance. -Patient indicates he is compliant with medications but then indicates he needs a refill on Keppra as he is out -Continue home medications including Keppra 1000 mg twice daily, and Zonegran 200 mg twice daily.   -Followup EEG without overt epileptiform discharges  Diabetes mellitus type 2 uncontrolled in the setting of noncompliance:  Lab Results  Component Value Date   HGBA1C 6.7 (H) 07/04/2020  - Metformin 1000 mg twice daily and NovoLog insulin. -Patient indicates he is also out of insulin, lengthy discussion about need for medication compliance at bedside -Continue sliding scale, hypoglycemic protocol   Medication noncompliance, severe  -Lengthy discussion at bedside today about need for compliance, patient notes he is out of insulin, seizure medication as well as Metformin -He remains  high risk for readmission due to no noncompliance as above with breakthrough seizure and uncontrolled diabetes in the setting of  medication lapses  Migraine headache: -Home medications include Maxalt 10 mg as needed for headache. -Possibly complex migraine given above  Essential hypertension: Blood pressures currently maintained.  Blood pressure medications appear to include Diovan 320 mg daily. -Continue pharmacy substitution of ibesartan  History of hyperthyroidism Lab Results  Component Value Date   TSH 4.244 07/05/2020   Chronic kidney disease stage IIIb:  - Creatinine 1.79 with BUN 32.  This appears to be around patient's baseline during last hospitalization in May. - Normal saline IV fluids at 75 mL/h - Recheck kidney function in a.m.  Depression: Home medications include Zoloft 50 mg daily. -Continue Zoloft  GERD: Home medications.  Include Protonix 40 mg twice daily  Ultimately will need to review his home medication list again at some point as patient unable to verify everything due to dysarthria at this time.  DVT prophylaxis: Lovenox Code Status: Full Family Communication: None present  Status is: Inpatient  Dispo: The patient is from: Home              Anticipated d/c is to: Home              Anticipated d/c date is: 24 to 48 hours              Patient currently not medically stable for discharge due to ongoing need for further evaluation work-up physical therapy in the setting of ambulatory dysfunction and post seizure weakness in the setting of Todd's paralysis.  Consultants:   Neurology  Procedures:   None  Antimicrobials:  None indicated  Subjective: No acute issues or events overnight, continues to complain of headache requesting for narcotics which we discussed was not appropriate.  Otherwise denies nausea, vomiting, diarrhea, constipation, fevers, chills.  Objective: Vitals:   07/04/20 2331 07/05/20 0351 07/05/20 0748 07/05/20 1127  BP: (!) 104/91 (!) 117/86 112/73 105/76  Pulse: 91 87 98 90  Resp: 18 19 20 20   Temp: 98.1 F (36.7 C) 98.1 F (36.7 C) 98.7 F  (37.1 C) 98.6 F (37 C)  TempSrc: Oral Oral Oral Oral  SpO2: 100% 97% 98% 96%  Weight:        Intake/Output Summary (Last 24 hours) at 07/05/2020 1424 Last data filed at 07/05/2020 1019 Gross per 24 hour  Intake 243 ml  Output 750 ml  Net -507 ml   Filed Weights   07/04/20 1100  Weight: 86 kg    Examination:  General:  Pleasantly resting in bed, No acute distress. HEENT:  Normocephalic atraumatic.  Sclerae nonicteric, noninjected.  Extraocular movements intact bilaterally. Neck:  Without mass or deformity.  Trachea is midline. Lungs:  Clear to auscultate bilaterally without rhonchi, wheeze, or rales. Heart:  Regular rate and rhythm.  Without murmurs, rubs, or gallops. Abdomen:  Soft, nontender, nondistended.  Without guarding or rebound. Extremities: Left foot 3 out of 5 strength, approximately at the knee 5 out of 5 strength and equal bilaterally, upper extremities equal strength bilaterally 5 out of 5. Vascular:  Dorsalis pedis and posterior tibial pulses palpable bilaterally. Skin:  Warm and dry, no erythema, no ulcerations.    Data Reviewed: I have personally reviewed following labs and imaging studies  CBC: Recent Labs  Lab 07/04/20 1157 07/04/20 1159 07/05/20 0401  WBC 6.5  --  4.4  NEUTROABS 3.9  --   --   HGB 13.0 14.6  12.8*  HCT 41.9 43.0 41.0  MCV 87.3  --  85.6  PLT 169  --  PLATELET CLUMPS NOTED ON SMEAR, UNABLE TO ESTIMATE   Basic Metabolic Panel: Recent Labs  Lab 07/04/20 1157 07/04/20 1159 07/05/20 0401  NA 136 141 138  K 4.9 4.9 4.3  CL 105 108 109  CO2 20*  --  20*  GLUCOSE 170* 166* 95  BUN 32* 34* 22*  CREATININE 1.79* 1.80* 1.20  CALCIUM 9.7  --  9.9   GFR: Estimated Creatinine Clearance: 70.3 mL/min (by C-G formula based on SCr of 1.2 mg/dL). Liver Function Tests: Recent Labs  Lab 07/04/20 1157  AST 22  ALT 33  ALKPHOS 67  BILITOT 0.4  PROT 7.8  ALBUMIN 4.6   No results for input(s): LIPASE, AMYLASE in the last 168  hours. No results for input(s): AMMONIA in the last 168 hours. Coagulation Profile: Recent Labs  Lab 07/04/20 1157  INR 1.1   Cardiac Enzymes: No results for input(s): CKTOTAL, CKMB, CKMBINDEX, TROPONINI in the last 168 hours. BNP (last 3 results) No results for input(s): PROBNP in the last 8760 hours. HbA1C: Recent Labs    07/04/20 1432  HGBA1C 6.7*   CBG: Recent Labs  Lab 07/04/20 1148 07/04/20 1635 07/04/20 2147 07/05/20 0615 07/05/20 1126  GLUCAP 167* 118* 100* 113* 136*   Lipid Profile: No results for input(s): CHOL, HDL, LDLCALC, TRIG, CHOLHDL, LDLDIRECT in the last 72 hours. Thyroid Function Tests: Recent Labs    07/05/20 0401  TSH 4.244  FREET4 0.73   Anemia Panel: No results for input(s): VITAMINB12, FOLATE, FERRITIN, TIBC, IRON, RETICCTPCT in the last 72 hours. Sepsis Labs: No results for input(s): PROCALCITON, LATICACIDVEN in the last 168 hours.  Recent Results (from the past 240 hour(s))  SARS Coronavirus 2 by RT PCR (hospital order, performed in [facility] hospital lab) Nasopharyngeal Nasopharyngeal Swab     Status: None   Collection Time: 07/04/20  5:28 PM   Specimen: Nasopharyngeal Swab  Result Value Ref Range Status   SARS Coronavirus 2 NEGATIVE NEGATIVE Final    Comment: (NOTE) SARS-CoV-2 target nucleic acids are NOT DETECTED.  The SARS-CoV-2 RNA is generally detectable in upper and lower respiratory specimens during the acute phase of infection. The lowest concentration of SARS-CoV-2 viral copies this assay can detect is 250 copies / mL. A negative result does not preclude SARS-CoV-2 infection and should not be used as the sole basis for treatment or other patient management decisions.  A negative result may occur with improper specimen collection / handling, submission of specimen other than nasopharyngeal swab, presence of viral mutation(s) within the areas targeted by this assay, and inadequate number of viral copies (<250 copies /  mL). A negative result must be combined with clinical observations, patient history, and epidemiological information.  Fact Sheet for Patients:   BoilerBrush.com.cy  Fact Sheet for Healthcare Providers: https://pope.com/  This test is not yet approved or  cleared by the [place] FDA and has been authorized for detection and/or diagnosis of SARS-CoV-2 by FDA under an Emergency Use Authorization (EUA).  This EUA will remain in effect (meaning this test can be used) for the duration of the COVID-19 declaration under Section 564(b)(1) of the Act, 21 U.S.C. section 360bbb-3(b)(1), unless the authorization is terminated or revoked sooner.  Performed at [facility], [address] [place], [place] [address]          Radiology Studies: EEG  Result Date: 07/04/2020 Melynda Ripple,  Kristopher Oppenheim, MD     07/04/2020  5:12 PM Patient Name: MURIEL WILBER MRN: [id number] Epilepsy Attending: Charlsie Quest Referring Physician/Provider: Dr. Caryl Pina Date: 07/04/2020 Duration: 24.02 mins Patient history: 59 year old male with history of seizures who presented with acute onset of left-sided weakness.  On examination patient was noted to have diffuse limb jerking with a blank stare.  EEG evaluate for seizures. Level of alertness: Awake, asleep AEDs during EEG study: None Technical aspects: This EEG study was done with scalp electrodes positioned according to the 10-20 International system of electrode placement. Electrical activity was acquired at a sampling rate of  and reviewed with a high frequency filter of  and a low frequency filter of . EEG data were recorded continuously and digitally stored. Description: The posterior dominant rhythm consists of 8 Hz activity of moderate voltage (25-35 uV) seen predominantly in posterior head regions, symmetric and reactive to eye opening and eye closing. Sleep was characterized by vertex waves, sleep  spindles (12 to 14 Hz), maximal frontocentral region.   Hyperventilation and photic stimulation were not performed.   IMPRESSION: This study is within normal limits. No seizures or epileptiform discharges were seen throughout the recording. Charlsie Quest   CT Code Stroke CTA Head W/WO contrast  Result Date: 07/04/2020 CLINICAL DATA:  Code stroke EXAM: CT HEAD WITHOUT CONTRAST CT ANGIOGRAPHY OF THE HEAD AND NECK TECHNIQUE: Contiguous axial images were obtained from the base of the skull through the vertex without intravenous contrast. Multidetector CT imaging of the head and neck was performed using the standard protocol during bolus administration of intravenous contrast. Multiplanar CT image reconstructions and MIPs were obtained to evaluate the vascular anatomy. Carotid stenosis measurements (when applicable) are obtained utilizing NASCET criteria, using the distal internal carotid diameter as the denominator. CONTRAST:  100 mL Omnipaque 350 COMPARISON:  05/03/2020 FINDINGS: CT HEAD Brain: There is no acute intracranial hemorrhage, mass effect, or edema. Gray-white differentiation is preserved. There is no extra-axial fluid collection. Ventricles and sulci are within normal limits in size and configuration. Vascular: No hyperdense vessel or unexpected calcification. Skull: Calvarium is unremarkable. Sinuses/Orbits: No acute finding. Other: None. Review of the MIP images confirms the above findings CTA NECK Aortic arch: Great vessel origins are patent. There is direct origin of the left vertebral artery from the arch. Right carotid system: Patent. No measurable stenosis or evidence of dissection. Left carotid system: Patent. No measurable stenosis or evidence of dissection. Vertebral arteries: Patent.  Right vertebral artery is dominant. Skeleton: Postoperative changes of anterior fusion at C4-C7. Degenerative changes of the cervical spine. Other neck: No mass or adenopathy. Upper chest: No apical lung mass.  Review of the MIP images confirms the above findings CTA HEAD Anterior circulation: Intracranial internal carotid arteries are patent. Anterior and middle cerebral arteries are patent. Posterior circulation: Intracranial vertebral arteries, basilar artery, and posterior cerebral arteries are patent. A right posterior communicating artery is present. Venous sinuses: Patent as allowed by contrast bolus timing. Review of the MIP images confirms the above findings IMPRESSION: No acute intracranial hemorrhage or evidence of acute infarction. ASPECT score is 10. No large vessel occlusion or hemodynamically significant stenosis. These results were communicated to Dr. Otelia Limes at 12:24 pmon 7/29/2021by text page via the [facility] messaging system. Electronically Signed   By: Guadlupe Spanish M.D.   On: 07/04/2020 12:33   CT Code Stroke CTA Neck W/WO contrast  Result Date: 07/04/2020 CLINICAL DATA:  Code stroke EXAM: CT HEAD WITHOUT CONTRAST  CT ANGIOGRAPHY OF THE HEAD AND NECK TECHNIQUE: Contiguous axial images were obtained from the base of the skull through the vertex without intravenous contrast. Multidetector CT imaging of the head and neck was performed using the standard protocol during bolus administration of intravenous contrast. Multiplanar CT image reconstructions and MIPs were obtained to evaluate the vascular anatomy. Carotid stenosis measurements (when applicable) are obtained utilizing NASCET criteria, using the distal internal carotid diameter as the denominator. CONTRAST:  100 mL Omnipaque 350 COMPARISON:  05/03/2020 FINDINGS: CT HEAD Brain: There is no acute intracranial hemorrhage, mass effect, or edema. Gray-white differentiation is preserved. There is no extra-axial fluid collection. Ventricles and sulci are within normal limits in size and configuration. Vascular: No hyperdense vessel or unexpected calcification. Skull: Calvarium is unremarkable. Sinuses/Orbits: No acute finding. Other: None. Review of the  MIP images confirms the above findings CTA NECK Aortic arch: Great vessel origins are patent. There is direct origin of the left vertebral artery from the arch. Right carotid system: Patent. No measurable stenosis or evidence of dissection. Left carotid system: Patent. No measurable stenosis or evidence of dissection. Vertebral arteries: Patent.  Right vertebral artery is dominant. Skeleton: Postoperative changes of anterior fusion at C4-C7. Degenerative changes of the cervical spine. Other neck: No mass or adenopathy. Upper chest: No apical lung mass. Review of the MIP images confirms the above findings CTA HEAD Anterior circulation: Intracranial internal carotid arteries are patent. Anterior and middle cerebral arteries are patent. Posterior circulation: Intracranial vertebral arteries, basilar artery, and posterior cerebral arteries are patent. A right posterior communicating artery is present. Venous sinuses: Patent as allowed by contrast bolus timing. Review of the MIP images confirms the above findings IMPRESSION: No acute intracranial hemorrhage or evidence of acute infarction. ASPECT score is 10. No large vessel occlusion or hemodynamically significant stenosis. These results were communicated to Dr. Otelia LimesLindzen at 12:24 pmon 7/29/2021by text page via the [facility] messaging system. Electronically Signed   By: Guadlupe SpanishPraneil  Patel M.D.   On: 07/04/2020 12:33   MR BRAIN WO CONTRAST  Result Date: 07/04/2020 CLINICAL DATA:  Neuro deficit, acute, stroke suspected. Additional history obtained from electronic MEDICAL RECORD NUMBERSyncope, patient found to have slurred speech and left-sided weakness, patient with seizure history. EXAM: MRI HEAD WITHOUT CONTRAST TECHNIQUE: Multiplanar, multiecho pulse sequences of the brain and surrounding structures were obtained without intravenous contrast. COMPARISON:  Noncontrast head CT and CT angiogram head/neck performed earlier the same day, brain MRI 09/16/2018 FINDINGS: Brain: Cerebral  volume is normal for age. No focal parenchymal signal abnormality is identified. There is no acute infarct. No evidence of intracranial mass. No chronic intracranial blood products. No extra-axial fluid collection. No midline shift. Vascular: Expected proximal arterial flow voids. Skull and upper cervical spine: There redemonstrated nonspecific heterogeneous marrow of the visualized upper cervical spine. No focal suspicious marrow lesion. Sinuses/Orbits: Visualized orbits show no acute finding. Trace ethmoid sinus mucosal thickening. No significant mastoid effusion IMPRESSION: No evidence of acute intracranial abnormality, including acute infarction. Redemonstrated nonspecific heterogeneous marrow signal within the visualized upper cervical spine without focal suspicious osseous lesion. Minimal ethmoid sinus mucosal thickening. Electronically Signed   By: Jackey LogeKyle  Golden DO   On: 07/04/2020 13:17   CT HEAD CODE STROKE WO CONTRAST  Result Date: 07/04/2020 CLINICAL DATA:  Code stroke EXAM: CT HEAD WITHOUT CONTRAST CT ANGIOGRAPHY OF THE HEAD AND NECK TECHNIQUE: Contiguous axial images were obtained from the base of the skull through the vertex without intravenous contrast. Multidetector CT imaging of the head  and neck was performed using the standard protocol during bolus administration of intravenous contrast. Multiplanar CT image reconstructions and MIPs were obtained to evaluate the vascular anatomy. Carotid stenosis measurements (when applicable) are obtained utilizing NASCET criteria, using the distal internal carotid diameter as the denominator. CONTRAST:  100 mL Omnipaque 350 COMPARISON:  05/03/2020 FINDINGS: CT HEAD Brain: There is no acute intracranial hemorrhage, mass effect, or edema. Gray-white differentiation is preserved. There is no extra-axial fluid collection. Ventricles and sulci are within normal limits in size and configuration. Vascular: No hyperdense vessel or unexpected calcification. Skull:  Calvarium is unremarkable. Sinuses/Orbits: No acute finding. Other: None. Review of the MIP images confirms the above findings CTA NECK Aortic arch: Great vessel origins are patent. There is direct origin of the left vertebral artery from the arch. Right carotid system: Patent. No measurable stenosis or evidence of dissection. Left carotid system: Patent. No measurable stenosis or evidence of dissection. Vertebral arteries: Patent.  Right vertebral artery is dominant. Skeleton: Postoperative changes of anterior fusion at C4-C7. Degenerative changes of the cervical spine. Other neck: No mass or adenopathy. Upper chest: No apical lung mass. Review of the MIP images confirms the above findings CTA HEAD Anterior circulation: Intracranial internal carotid arteries are patent. Anterior and middle cerebral arteries are patent. Posterior circulation: Intracranial vertebral arteries, basilar artery, and posterior cerebral arteries are patent. A right posterior communicating artery is present. Venous sinuses: Patent as allowed by contrast bolus timing. Review of the MIP images confirms the above findings IMPRESSION: No acute intracranial hemorrhage or evidence of acute infarction. ASPECT score is 10. No large vessel occlusion or hemodynamically significant stenosis. These results were communicated to Dr. Otelia Limes at 12:24 pmon 7/29/2021by text page via the [facility] messaging system. Electronically Signed   By: Guadlupe Spanish M.D.   On: 07/04/2020 12:33        Scheduled Meds: . atorvastatin  80 mg Oral Daily  . clopidogrel  75 mg Oral Daily  . enoxaparin (LOVENOX) injection  40 mg Subcutaneous Q24H  . insulin aspart  0-15 Units Subcutaneous TID WC  . insulin aspart  0-5 Units Subcutaneous QHS  . sodium chloride flush  3 mL Intravenous Once  . sodium chloride flush  3 mL Intravenous Q12H   Continuous Infusions:   LOS: 1 day   Time spent:  Azucena Fallen, DO [facility]  If 7PM-7AM, please  contact night-coverage [email or web address]  07/05/2020, 2:24 PM

## 2020-07-05 NOTE — Evaluation (Signed)
Physical Therapy Evaluation Patient Details Name: Nicholas [name] MRN: [id number] DOB: 1961/10/31 Today's Date: 07/05/2020   History of Present Illness  Patient is a 59 y/o male who presents with left sided weakness and an episode of LUE jerking with blank stare for ~30 seconds, per MD consistent with embellishment. Rule out seizure vs complex migraine vs somatization vs another cause?[name] PMH includes DM, TIA, seizures, HTN, glaucome, bipolar disorder.  Clinical Impression  Patient presents with left sided weakness, speech difficulties, impaired cognition, impaired balance, impaired mobility and pain in LLE s/p above. Pt lives in a hotel at this time awaiting housing and has a friend (cab driver named Kathlene November) who drives him to the grocery store. Pt independent PTA. Initially, pt with slurred, non fluent and jumbled speech, however towards end of session, speech is clear and fluent without issues. Also noted to have some inconsistent findings during objective testing of LLE vs functional mobility. Giveway weakness noted in LLE with difficulty maintaining DF against gravity but no issues with advancing LLE or foot clearance during mobility and no knee buckling, instability noted. Pt requires Min A of 2 for gait training and limited due to burning pain in LLE. Does well with distraction. Waxing and waning confusion noted as well but towards end of session, pt clearly stating entire medical history with lab details and drug history. Lost wife 1 year ago in June and her bday and their anniversary are coming up. Also a recovering cocaine addict, currently in a drug recovery program called [facility]? Will follow acutely to maximize independence and mobility prior to return home. Will try RW vs SPC next session.    Follow Up Recommendations Home health PT;Supervision for mobility/OOB (pending improvement)    Equipment Recommendations  Other (comment) (TBA try SPC next session vs RW)    Recommendations for Other  Services       Precautions / Restrictions Precautions Precautions: Fall Restrictions Weight Bearing Restrictions: No      Mobility  Bed Mobility               General bed mobility comments: Sitting EOB upon PT arrival with OT present.  Transfers Overall transfer level: Needs assistance Equipment used: 2 person hand held assist Transfers: Sit to/from Stand Sit to Stand: Min assist;+2 physical assistance         General transfer comment: Assist of 2 to stand from EOB with pt off loading LLE and noted to have left knee flexion.  Ambulation/Gait Ambulation/Gait assistance: Min assist;+2 physical assistance Gait Distance (Feet): 25 Feet Assistive device: 2 person hand held assist Gait Pattern/deviations: Step-to pattern;Step-through pattern;Decreased stance time - left;Narrow base of support Gait velocity: decreased Gait velocity interpretation: <1.31 ft/sec, indicative of household ambulator General Gait Details: Slow, mildly unsteady gait with pt looking down at LLE during ambulation; increased knee flexion throughout. Demonstrates sufficient DF for foot clearance and knee instability but no buckling. Reports burning.  Stairs            Wheelchair Mobility    Modified Rankin (Stroke Patients Only) Modified Rankin (Stroke Patients Only) Pre-Morbid Rankin Score: No symptoms Modified Rankin: Moderately severe disability     Balance Overall balance assessment: Needs assistance Sitting-balance support: Feet supported;No upper extremity supported Sitting balance-Leahy Scale: Fair     Standing balance support: During functional activity Standing balance-Leahy Scale: Poor Standing balance comment: Requires external support.  Pertinent Vitals/Pain Pain Assessment: Faces Faces Pain Scale: Hurts little more Pain Location: LLE Pain Descriptors / Indicators: Burning Pain Intervention(s): Monitored during  session;Repositioned    Home Living Family/patient expects to be discharged to:: Private residence Living Arrangements: Alone   Type of Home: Other(Comment) ([facility]) Home Access: Stairs to enter   [employer] of Steps: a few Home Layout: One level Home Equipment: None Additional Comments: waiting on a cane    Prior Function Level of Independence: Independent         Comments: mike (cab driver) when he is off the clock takes him to the grocery store; does not work. Participating in a drug recovery program called [facility]? Supposed to be moving into housing in ~2 weeks awaiting inspection. Walks independently at baseline.     Hand Dominance   Dominant Hand: Right    Extremity/Trunk Assessment   Upper Extremity Assessment Upper Extremity Assessment: Defer to OT evaluation    Lower Extremity Assessment Lower Extremity Assessment: LLE deficits/detail LLE Deficits / Details: Giveway weakness with quads, hip flexors and ankle DF with pain; reports burning sensation entire LE. No difficulty with advancing LLE and ankle DF during gait, just exaggerated knee weakness. LLE Sensation:  (burning)    Cervical / Trunk Assessment Cervical / Trunk Assessment: Normal  Communication   Communication:  (initially jerky, non fluent, slurred speech progressing to more normalized speech pattern at end of session)  Cognition Arousal/Alertness: Awake/alert Behavior During Therapy: WFL for tasks assessed/performed Overall Cognitive Status: Impaired/Different from baseline Area of Impairment: Memory;Safety/judgement;Awareness;Problem solving                     Memory: Decreased short-term memory   Safety/Judgement: Decreased awareness of deficits;Decreased awareness of safety Awareness: Intellectual Problem Solving: Slow processing;Requires verbal cues General Comments: initially, speech is jumbled, slurred and non fluent but towards end of session  with pt describing stress from losing wife (her bday and their anniversary is soon), and medical hx (cocaine addict in recovery), speech became clear and fluent without issues. Inconsistent findings with objectibe MMT vs functional testing. Does well with distraction. Not able to recall OT present in room and where he was during session after being told but easily stating and recalling things like specific numbers about A1c and lab values at end of session.      General Comments      Exercises     Assessment/Plan    PT Assessment Patient needs continued PT services  PT Problem List Decreased strength;Decreased mobility;Decreased safety awareness;Decreased balance;Pain;Decreased activity tolerance;Decreased cognition       PT Treatment Interventions Gait training;Therapeutic exercise;Patient/family education;Balance training;Functional mobility training;Stair training;DME instruction;Therapeutic activities;Cognitive remediation    PT Goals (Current goals can be found in the Care Plan section)  Acute Rehab PT Goals Patient Stated Goal: to get into housing PT Goal Formulation: With patient Time For Goal Achievement: 07/19/20 Potential to Achieve Goals: Good    Frequency Min 4X/week   Barriers to discharge Decreased caregiver support      Co-evaluation PT/OT/SLP Co-Evaluation/Treatment: Yes Reason for Co-Treatment: Necessary to address cognition/behavior during functional activity;To address functional/ADL transfers;For patient/therapist safety PT goals addressed during session: Mobility/safety with mobility;Balance;Strengthening/ROM         AM-PAC PT "6 Clicks" Mobility  Outcome Measure Help needed turning from your back to your side while in a flat bed without using bedrails?: A Little Help needed moving from lying on your back to sitting on the side of  a flat bed without using bedrails?: A Little Help needed moving to and from a bed to a chair (including a wheelchair)?: A  Little Help needed standing up from a chair using your arms (e.g., wheelchair or bedside chair)?: A Little Help needed to walk in hospital room?: A Little Help needed climbing 3-5 steps with a railing? : A Lot 6 Click Score: 17    End of Session Equipment Utilized During Treatment: Gait belt Activity Tolerance: Patient limited by pain;Patient tolerated treatment well Patient left: in bed;with call bell/phone within reach;with bed alarm set Nurse Communication: Mobility status PT Visit Diagnosis: Pain;Unsteadiness on feet (R26.81);Difficulty in walking, not elsewhere classified (R26.2) Pain - Right/Left: Left Pain - part of body: Leg    Time: 8250-5397 PT Time Calculation (min) (ACUTE ONLY): 21 min   Charges:   PT Evaluation $PT Eval Moderate Complexity: 1 Mod          Vale Haven, PT, DPT Acute Rehabilitation Services Pager [phone] Office [phone]      Blake Divine A Lanier Ensign 07/05/2020, 9:46 AM

## 2020-07-05 NOTE — Evaluation (Signed)
Speech Language Pathology Evaluation Patient Details Name: Nicholas [name] MRN: [id number] DOB: 10-25-1961 Today's Date: 07/05/2020 Time: 6945-0388 SLP Time Calculation (min) (ACUTE ONLY): 20 min  Problem List:  Patient Active Problem List   Diagnosis Date Noted  . Left-sided weakness 07/04/2020  . Dysarthria 07/04/2020  . Essential hypertension   . Seizure disorder (HCC) 07/02/2017  . Insulin-requiring or dependent type II diabetes mellitus (HCC) 07/02/2017  . CKD (chronic kidney disease), stage III 07/02/2017  . Normocytic anemia 07/02/2017  . Testicular/scrotal pain 11/09/2013  . Microhematuria 11/09/2013  . Condyloma acuminatum of scrotum 11/09/2013   Past Medical History:  Past Medical History:  Diagnosis Date  . Asthma   . Bipolar 1 disorder (HCC)   . Borderline glaucoma   . Chronic pain   . Epididymal pain    LEFT  . 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 Feeling of incomplete bladder emptying   . Frequency of urination   . Gastric ulcer   . GERD (gastroesophageal reflux disease)   . Hypertension   . Hyperthyroidism    NO MEDS   . Migraine   . Seizures (HCC)   . TIA (transient ischemic attack)   . Type 2 diabetes mellitus (HCC)    Past Surgical History:  Past Surgical History:  Procedure Laterality Date  . ABDOMINAL SURGERY    . ANTERIOR CERVICAL DECOMP/DISCECTOMY FUSION  2007   C4  --  C6  . CERVICAL FUSION    . CHOLECYSTECTOMY    . CYSTOSCOPY N/A 11/09/2013   Procedure: CYSTOSCOPY FLEXIBLE;  Surgeon: Bjorn Pippin, MD;  Location: [facility];  Service: Urology;  Laterality: N/A;  . EPIDIDYMECTOMY Left 11/09/2013   Procedure:  LEFT EPIDIDYMECTOMY;  Surgeon: Bjorn Pippin, MD;  Location: [facility];  Service: Urology;  Laterality: Left;  POSSIBLE OUTPATIENT WITH OBSERVATION  . EXCISION LIPOMA LEFT SHOULDER  2004 (APPROX)  . MULTIPLE CYST  REMOVED FROM CHEST  AGE 41  . OTHER SURGICAL HISTORY     hemorroid surgery   . TESTICLE REMOVAL Left   . TONSILLECTOMY     HPI:  Nicholas [name] is a 59 y.o. male with medical history significant of hypertension, hyperthyroidism, DM type II, bipolar 1 disorder, migraine headaches, seizure disorder, and history of COVID-19 infection in 2020 presented with complaints of left-sided weakness and slurred speech.  Pt with witnessed 30s seizure in ED.  MRI 7/30 with no acute findings.   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  Pt's vocal quality is harsh with low vocal intensity.  Pt reports this is an acute and significant change.  Breath support adequate for phonation. Intermittent hypernasal resonance noted in speech. No ataxia noted.  Pt completed naping and repetition tasks without any errors.  No apraxic errors noted.  There were a few prolongations of sounds, which sounded like a stuttering type speech error.  When this was shared with patient, he reported significant remote hx of stuttering as a child.  Pt is able to [employer] strategies independently and speaks slowly to improve intelligibility.  Reviewed additional strategies with patient.    Recommend speech therapy to address dysarthria while pt is in house  with home health speech therapy after discharge.    SLP Assessment  SLP Recommendation/Assessment: Patient needs continued Speech Lanaguage Pathology Services SLP Visit Diagnosis: Dysarthria and anarthria (R47.1)    Follow Up Recommendations  Home health SLP    Frequency and Duration min 2x/week  2 weeks      SLP Evaluation Cognition  Overall Cognitive Status: Within Functional Limits for tasks assessed Arousal/Alertness: Awake/alert Orientation Level: Oriented  X4 Attention: Focused Focused Attention: Appears intact       Comprehension  Auditory Comprehension Overall Auditory Comprehension: Appears within functional limits for tasks assessed Conversation: Complex    Expression Expression Primary Mode of Expression: Verbal Verbal Expression Overall Verbal Expression: Appears within functional limits for tasks assessed Naming: No impairment Written Expression Dominant Hand: Right   Oral / Motor  Oral Motor/Sensory Function Overall Oral Motor/Sensory Function: Mild impairment Facial ROM: Reduced left Facial Symmetry: Abnormal symmetry left Lingual ROM: Within Functional Limits Lingual Symmetry: Within Functional Limits Lingual Strength: Within Functional Limits Velum: Within Functional Limits Mandible: Within Functional Limits Motor Speech Overall Motor Speech: Impaired Phonation: Hoarse;Low vocal intensity Resonance: Hypernasality Articulation: Impaired Intelligibility: Intelligibility reduced Word: 75-100% accurate Motor Planning: Witnin functional limits Motor Speech Errors: Aware Effective Techniques: Slow rate;Over-articulate;Increased vocal intensity;Pause   GO                    Kerrie Pleasure, MA, CCC-SLP Acute Rehabilitation Services Office: [phone]  07/05/2020, 1:26 PM

## 2020-07-05 NOTE — Progress Notes (Signed)
Subjective: No further seizure overnight. Patient states he has had seizures since he ws 59 yo and sees Dr Rosario Jacks for management. Also reports chronic headaches. States he doesn't remember when he has seizures but others have described them as GTCs. Also reports having headache for a week sometimes before breakthrough seizure. States his last sz was in 2020.   I also reviewed DrFeraru's note from 05/17/2020. Patient has had EMU admission in past where spells were captured and were non epileptic. He has also had episodes of transient left and right sided weakness which gradually resolves.   Currently patient reports left arm weakness and pins and needles sensation in left arm and leg, "maybe face". Also reports headache and backpain which are chronic  ROS: negative except above   Examination  Vital signs in last 24 hours: Temp:  [98.1 F (36.7 C)-98.7 F (37.1 C)] 98.6 F (37 C) (07/30 1127) Pulse Rate:  [78-105] 90 (07/30 1127) Resp:  [9-20] 20 (07/30 1127) BP: (104-118)/(72-91) 105/76 (07/30 1127) SpO2:  [92 %-100 %] 96 % (07/30 1127)  General: lying in bed, NAD CVS: pulse-normal rate and rhythm RS: breathing comfortably Extremities: normal   Neuro: MS: Alert, oriented, follows commands CN: pupils equal and reactive,  EOMI, face symmetric, tongue midline, normal sensation over face, Motor: 5/5 strength in RUE/RLE, 0/5 in LUE. Didn't move to noxious stimuli but appears to have normal tone. 3/5 in LLE.  Sensory: reports decreased sensation in LUE/LLE but answer changes everytime I repeat the exam   Basic Metabolic Panel: Recent Labs  Lab 07/04/20 1157 07/04/20 1159 07/05/20 0401  NA 136 141 138  K 4.9 4.9 4.3  CL 105 108 109  CO2 20*  --  20*  GLUCOSE 170* 166* 95  BUN 32* 34* 22*  CREATININE 1.79* 1.80* 1.20  CALCIUM 9.7  --  9.9    CBC: Recent Labs  Lab 07/04/20 1157 07/04/20 1159 07/05/20 0401  WBC 6.5  --  4.4  NEUTROABS 3.9  --   --   HGB 13.0 14.6  12.8*  HCT 41.9 43.0 41.0  MCV 87.3  --  85.6  PLT 169  --  PLATELET CLUMPS NOTED ON SMEAR, UNABLE TO ESTIMATE     Coagulation Studies: Recent Labs    07/04/20 1157  LABPROT 14.0  INR 1.1    Imaging MR brain wo contrast 07/04/2020: No evidence of acute intracranial abnormality, including acute infarction.  ASSESSMENT AND PLAN: 59 y.o. male with a history of seizures vs PNES, chronic headache and TIA, who presents with acute onset of left sided weakness and noted to have seizure like episode described as diffuse limb jerking with a blank stare; the jerking movements were of varying amplitude and frequency, also varying with tactile stimulation. After the jerking spell, which lasted for about 30 seconds, the patient's speech was dysarthric, slow and stuttering.    Seizure Chronic headache Left hemiparesis - Patient reports having seizures since he was 59yo. Pr chart review, he has had EMU admission in past and event were found to be non epileptic. His routine eeg here was also normal. The semiology of episode described above is highly suggestive of non epileptic event. However, it is possible patient has bothe epileptic and non epileptic events.  - On exam , there appears to be component of minimal effort for his left arm and leg weakness as well as paresthesias. Also, per notes, he was able to show 3/5 strength while working with OT earlier -  DDx for left side weakness: hemiplegic migraine ( likely due to patient's h/o headaches and two similar prior events with normal MRI today), less likely todd's palsy as seizure happened after the left sided weakness, less likely TIA as deficits are reportedly still present even though patient's exam has several functional components   Recommendations - Continue home dose of keppra 1000mg  BID and zonisamide 200mg  BID - Can give an additional dose of gabapentin 300mg  BID if paresthesias are bothersome.  - PRN IV toradol for headache  - If seizures  persist, repeat EMU evaluation can be considered for characterization - continue seizure precautions, patient doesn't drive - No further inpatient neurologic workup at this point. - f/u with Dr in 8-12 weeks   I have spent a total of  35  minutes with the patient reviewing hospital notes,  test results, labs and examining the patient as well as establishing an assessment and plan that was discussed personally with the patient.  > 50% of time was spent in direct patient care.   Epilepsy [facility] For questions after 5pm please refer to AMION to reach the Neurologist on call

## 2020-07-06 LAB — CBC
HCT: 40.9 % (ref 39.0–52.0)
Hemoglobin: 13 g/dL (ref 13.0–17.0)
MCH: 27 pg (ref 26.0–34.0)
MCHC: 31.8 g/dL (ref 30.0–36.0)
MCV: 84.9 fL (ref 80.0–100.0)
Platelets: 171 10*3/uL (ref 150–400)
RBC: 4.82 MIL/uL (ref 4.22–5.81)
RDW: 16.7 % — ABNORMAL HIGH (ref 11.5–15.5)
WBC: 5.1 10*3/uL (ref 4.0–10.5)
nRBC: 0 % (ref 0.0–0.2)

## 2020-07-06 LAB — COMPREHENSIVE METABOLIC PANEL
ALT: 32 U/L (ref 0–44)
AST: 23 U/L (ref 15–41)
Albumin: 4.1 g/dL (ref 3.5–5.0)
Alkaline Phosphatase: 60 U/L (ref 38–126)
Anion gap: 10 (ref 5–15)
BUN: 18 mg/dL (ref 6–20)
CO2: 21 mmol/L — ABNORMAL LOW (ref 22–32)
Calcium: 9.6 mg/dL (ref 8.9–10.3)
Chloride: 106 mmol/L (ref 98–111)
Creatinine, Ser: 1.35 mg/dL — ABNORMAL HIGH (ref 0.61–1.24)
GFR calc Af Amer: >60 mL/min (ref >60)
GFR calc non Af Amer: 57 mL/min — ABNORMAL LOW (ref >60)
Glucose, Bld: 173 mg/dL — ABNORMAL HIGH (ref 70–99)
Potassium: 4 mmol/L (ref 3.5–5.1)
Sodium: 137 mmol/L (ref 135–145)
Total Bilirubin: 0.6 mg/dL (ref 0.3–1.2)
Total Protein: 7.4 g/dL (ref 6.5–8.1)

## 2020-07-06 LAB — GLUCOSE, CAPILLARY
Glucose-Capillary: 117 mg/dL — ABNORMAL HIGH (ref 70–99)
Glucose-Capillary: 253 mg/dL — ABNORMAL HIGH (ref 70–99)
Glucose-Capillary: 135 mg/dL — ABNORMAL HIGH (ref 70–99)

## 2020-07-06 MED ORDER — GABAPENTIN 300 MG PO CAPS: 600.0000 mg | ORAL_CAPSULE | Freq: Two times a day (BID) | ORAL | Status: DC

## 2020-07-06 NOTE — TOC Transition Note (Signed)
Transition of Care [facility]) - CM/SW Discharge Note   Patient Details  Name: Nicholas [name] MRN: [id number] Date of Birth: 01-Jan-1961  [facility] CM/SW Contact:  Lawerance Sabal, RN Phone Number: 07/06/2020, 8:07 AM   Clinical Narrative:    [facility] notified of DC, DME ordered by CM yesterday, meds in main pharmacy filled by [facility] [place] will need to go home with him, CM yesterday left taxi voucher w charge RN.  No other CM needs.     Final next level of care: Home w Home Health Services Barriers to Discharge: No Barriers Identified   Patient Goals and CMS Choice   CMS Medicare.gov Compare Post Acute Care list provided to:: Patient Choice offered to / list presented to : Patient  Discharge Placement                       Discharge Plan and Services   Discharge Planning Services: CM Consult Post Acute Care Choice: Home Health, Durable Medical Equipment          DME Arranged: Walker rolling DME Agency: [facility] Date DME Agency Contacted: 07/05/20   Representative spoke with at DME Agency: Zack HH Arranged: PT, OT, Speech Therapy HH Agency: [facility] Date HH Agency Contacted: 07/05/20   Representative spoke with at [facility] Agency: Lupita Leash  Social Determinants of Health (SDOH) Interventions     Readmission Risk Interventions No flowsheet data found.

## 2020-07-06 NOTE — Progress Notes (Signed)
Physical Therapy Treatment Patient Details Name: Nicholas [name] MRN: [id number] DOB: Apr 19, 1961 Today's Date: 07/06/2020    History of Present Illness Patient is a 59 y/o male who presents with left sided weakness and an episode of LUE jerking with blank stare for ~30 seconds, per MD consistent with embellishment. Rule out seizure vs complex migraine vs somatization vs another cause?[name] PMH includes DM, TIA, seizures, HTN, glaucome, bipolar disorder.    PT Comments    Pt in recliner on arrival. He required supervision transfers and min guard assist ambulation 200' with RW. Current POC remains appropriate.   Follow Up Recommendations  Home health PT;Supervision for mobility/OOB     Equipment Recommendations  Rolling walker with 5" wheels    Recommendations for Other Services       Precautions / Restrictions Precautions Precautions: Fall    Mobility  Bed Mobility Overal bed mobility: Modified Independent                Transfers Overall transfer level: Needs assistance Equipment used: Rolling walker (2 wheeled) Transfers: Sit to/from [employer] Transfers Sit to Stand: Supervision Stand pivot transfers: Supervision       General transfer comment: supervision for safety  Ambulation/Gait Ambulation/Gait assistance: Min guard Gait Distance (Feet): 200 Feet Assistive device: Rolling walker (2 wheeled) Gait Pattern/deviations: Step-through pattern Gait velocity: decreased Gait velocity interpretation: <1.31 ft/sec, indicative of household ambulator General Gait Details: steady gait with RW   Stairs             Wheelchair Mobility    Modified Rankin (Stroke Patients Only)       Balance Overall balance assessment: Needs assistance Sitting-balance support: Feet supported;No upper extremity supported Sitting balance-Leahy Scale: Good     Standing balance support: No upper extremity supported;During functional activity Standing balance-Leahy  Scale: Fair Standing balance comment: static stand without UE support, RW for amb                            Cognition Arousal/Alertness: Awake/alert Behavior During Therapy: WFL for tasks assessed/performed Overall Cognitive Status: Within Functional Limits for tasks assessed                           Safety/Judgement: Decreased awareness of deficits            Exercises      General Comments        Pertinent Vitals/Pain Pain Assessment: No/denies pain    Home Living                      Prior Function            PT Goals (current goals can now be found in the care plan section) Acute Rehab PT Goals Patient Stated Goal: to get into housing Progress towards PT goals: Progressing toward goals    Frequency    Min 4X/week      PT Plan Current plan remains appropriate;Equipment recommendations need to be updated    Co-evaluation              AM-PAC PT "6 Clicks" Mobility   Outcome Measure  Help needed turning from your back to your side while in a flat bed without using bedrails?: None Help needed moving from lying on your back to sitting on the side of a flat bed without using bedrails?: None Help needed moving to and  from a bed to a chair (including a wheelchair)?: A Little Help needed standing up from a chair using your arms (e.g., wheelchair or bedside chair)?: A Little Help needed to walk in hospital room?: A Little Help needed climbing 3-5 steps with a railing? : A Lot 6 Click Score: 19    End of Session Equipment Utilized During Treatment: Gait belt Activity Tolerance: Patient tolerated treatment well Patient left: in chair;with call bell/phone within reach;with chair alarm set Nurse Communication: Mobility status PT Visit Diagnosis: Unsteadiness on feet (R26.81);Difficulty in walking, not elsewhere classified (R26.2)     Time: 9983-3825 PT Time Calculation (min) (ACUTE ONLY): 12 min  Charges:  $Gait  Training: 8-22 mins                     Aida Raider, PT  Office # [phone] Pager [phone]    Ilda Foil 07/06/2020, 12:24 PM

## 2020-07-06 NOTE — Discharge Summary (Signed)
Physician Discharge Summary  TRAVES MAJCHRZAK ZOX:[id number] DOB: Aug 05, 1961 DOA: 07/04/2020  PCP: Patient, No Pcp Per  Admit date: 07/04/2020 Discharge date: 07/06/2020  Admitted From: Home Disposition:  Home with [facility]  Recommendations for Outpatient Follow-up:  1. Follow up with PCP in 1-2 weeks 2. Please obtain BMP/CBC in one week 3. Please follow up on the following pending results:  Home Health: Yes  Equipment/Devices:None  Discharge Condition: Stable  CODE STATUS:Full  Diet recommendation:    Brief/Interim Summary: Artis Flock Terryis a 59 y.o.malewith medical history significant ofhypertension, hyperthyroidism, DM type II, bipolar 1 disorder,migraine headaches,seizure disorder, and history of COVID-19 infection in 2020presented with complaints of left-sided weakness and slurred speech. Records note EMS was called after a home health nurse came by to see the patient and when the patient opened the door he slumped over to the side and passed out. It is not clear the patient was out. Despite the change in his speech he reports knowing what he wants to say, but is having a difficult time getting it out.[name] He can move his arm some, but is unable to move his left leg at all. Notes associated symptoms of a severe left-sided headache, numbness/tingling, change in vision in left eye, and currently lumbar back pain. Denies having any chest pain, recent fever, sick contacts, palpitations, abdominal pain, nausea, vomiting, diarrhea, or drug use. Review of records shows previous hospitalizations in 08/2018 with left-sided weakness for which patient received TPA, but MRI was normal. Last hospitalization on 03/07/2020 with right-sided weakness there was a question of possible pseudoseizures as patient reportedly had spells with normal EEG question possibility of somatizations. In ED he was seen as a code stroke upon entering the emergency department and was evaluated by neurology. Witnessed having  diffuse limb jerking with blank stare lasting approximately 30 seconds patient was noted to be dysarthric. CT scan of the brain showed no acute abnormalities. Patient was not deemed a TPA candidate. His vital signs were noted to be relatively stable. Labs significant for BUN 32, creatinine 1.79,andglucose 170.Subsequently MRI of the brain did not show any acute abnormalities.COVID-19 screening was still pending.   Patient as above with concern for acute CVA given unilateral left-sided weakness.  Fortunately patient's imaging was unremarkable both CT and MRI showed no acute changes or findings.  Patient continued to have left-sided weakness, there was a seizure noted at intake, it is unclear whether or not the seizure was prior to the weakness or after, if the seizure was prior to weakness Todd's paralysis is certainly of consideration.  Regardless patient's weakness resolved over the past 24 hours, now ambulating without difficulty and otherwise stable for discharge home.  We discussed continuing all home medications as prescribed, he did complain of some worsening paresthesias both in his hands and feet, and he is currently on 900 mg of gabapentin nightly we discussed increasing patient's dose to 600 mg twice daily to further control symptoms, he will need close follow-up with PCP and neurology in the outpatient setting as scheduled.  Patient otherwise stable and agreeable for discharge home.  Discharge Diagnoses:  Principal Problem:   Left-sided weakness Active Problems:   Seizure disorder (HCC)   Insulin-requiring or dependent type II diabetes mellitus (HCC)   CKD (chronic kidney disease), stage III   Essential hypertension   Dysarthria    Discharge Instructions  Discharge Instructions    Call MD for:  difficulty breathing, headache or visual disturbances   Complete by: As directed  Call MD for:  extreme fatigue   Complete by: As directed    Call MD for:  hives   Complete by: As  directed    Call MD for:  persistant dizziness or light-headedness   Complete by: As directed    Call MD for:  persistant nausea and vomiting   Complete by: As directed    Call MD for:  severe uncontrolled pain   Complete by: As directed    Call MD for:  temperature >100.4   Complete by: As directed    Diet - low sodium heart healthy   Complete by: As directed    Increase activity slowly   Complete by: As directed      Allergies as of 07/06/2020      Reactions   Levothyroxine Anaphylaxis, Cough   Chronic cough   Nsaids Other (See Comments)   D/t gastric ulcer   Amoxicillin Itching, Other (See Comments)   THRUSH Causes sores in mouth   Ampicillin Other (See Comments)   THRUSH   Penicillins Itching, Other (See Comments)   THRUSH Causes sores in mouth   Strawberry Extract Swelling   LIPS SWELL   Asa [aspirin] Other (See Comments)   Bleeding   Bactrim [sulfamethoxazole-trimethoprim] Hives, Itching, Other (See Comments)   GI upset   Depakote [divalproex Sodium]    Causes double vision and speech problems    Dilantin [phenytoin] Other (See Comments)   Severe skin peeling   Methocarbamol Other (See Comments)   dizziness   Risperidone And Related Other (See Comments)   Hallucinations   Sitagliptin Other (See Comments)   pancreatitis   Strawberry (diagnostic) Itching, Swelling   Sulfa Antibiotics Other (See Comments)   Affects thyroid   Tolmetin Other (See Comments)   D/t gastric ulcer   Ultram [tramadol Hcl] Other (See Comments)   D/t gastric ulcer   Oatmeal Hives, Other (See Comments)   White spots/sores in mouth      Medication List    STOP taking these medications   acetaminophen-codeine 120-12 MG/5ML solution   azithromycin 250 MG tablet Commonly known as: ZITHROMAX   benzonatate 100 MG capsule Commonly known as: TESSALON   carisoprodol 350 MG tablet Commonly known as: SOMA   clindamycin 300 MG capsule Commonly known as: CLEOCIN   clotrimazole 1 %  cream Commonly known as: LOTRIMIN   doxycycline 100 MG capsule Commonly known as: VIBRAMYCIN   Guaifenesin 1200 MG Tb12   levofloxacin 750 MG tablet Commonly known as: Levaquin   lidocaine 2 % solution Commonly known as: XYLOCAINE   lidocaine 5 % Commonly known as: Lidoderm   methocarbamol 500 MG tablet Commonly known as: ROBAXIN   promethazine 25 MG tablet Commonly known as: PHENERGAN   promethazine-dextromethorphan 6.25-15 MG/5ML syrup Commonly known as: PROMETHAZINE-DM   sucralfate 1 GM/10ML suspension Commonly known as: Carafate     TAKE these medications   acetaminophen 650 MG CR tablet Commonly known as: Tylenol 8 Hour Take 1 tablet (650 mg total) by mouth every 8 (eight) hours.   albuterol 108 (90 Base) MCG/ACT inhaler Commonly known as: VENTOLIN HFA Inhale 1-2 puffs into the lungs every 6 (six) hours as needed for wheezing or shortness of breath.   atorvastatin 80 MG tablet Commonly known as: LIPITOR Take 80 mg by mouth at bedtime.   budesonide-formoterol 80-4.5 MCG/ACT inhaler Commonly known as: SYMBICORT Inhale 2 puffs into the lungs daily.   clopidogrel 75 MG tablet Commonly known as: PLAVIX Take 1 tablet (75  mg total) by mouth daily.   ergocalciferol 1.25 MG (50000 UT) capsule Commonly known as: VITAMIN D2 Take 50,000 Units by mouth every Monday.   fenofibrate 145 MG tablet Commonly known as: TRICOR Take 145 mg by mouth daily.   fluticasone-salmeterol 115-21 MCG/ACT inhaler Commonly known as: ADVAIR HFA Inhale 2 puffs into the lungs daily.   gabapentin 300 MG capsule Commonly known as: NEURONTIN Take 3 capsules (900 mg total) by mouth at bedtime.   HumaLOG KwikPen 200 UNIT/ML KwikPen Generic drug: insulin lispro Inject 0-10 Units into the skin 3 (three) times daily with meals.   HYDROcodone-acetaminophen 10-325 MG tablet Commonly known as: NORCO Take 1 tablet by mouth 4 (four) times daily.   latanoprost 0.005 % ophthalmic  solution Commonly known as: XALATAN Place 1 drop into both eyes at bedtime.   levETIRAcetam 1000 MG tablet Commonly known as: KEPPRA Take 1 tablet (1,000 mg total) by mouth 2 (two) times daily. What changed: Another medication with the same name was removed. Continue taking this medication, and follow the directions you see here.   metFORMIN 1000 MG tablet Commonly known as: GLUCOPHAGE Take 1 tablet (1,000 mg total) by mouth 2 (two) times daily.   nortriptyline 50 MG capsule Commonly known as: PAMELOR Take 100 mg by mouth at bedtime.   pantoprazole 40 MG tablet Commonly known as: PROTONIX Take 40 mg by mouth 2 (two) times daily.   polyethylene glycol powder 17 GM/SCOOP powder Commonly known as: GLYCOLAX/MIRALAX Take 17 g by mouth daily as needed for mild constipation.   rizatriptan 10 MG tablet Commonly known as: MAXALT Take 10 mg by mouth daily. May repeat in 2 hours if needed   sertraline 50 MG tablet Commonly known as: ZOLOFT Take 50 mg by mouth daily.   timolol 0.5 % ophthalmic solution Commonly known as: TIMOPTIC Place 1 drop into both eyes 2 (two) times daily.   tiZANidine 4 MG tablet Commonly known as: ZANAFLEX Take 2-8 mg by mouth 4 (four) times daily as needed for muscle spasms.   valsartan 320 MG tablet Commonly known as: DIOVAN Take 320 mg by mouth daily.   zonisamide 100 MG capsule Commonly known as: ZONEGRAN Take 200 mg by mouth 2 (two) times daily.            Durable Medical Equipment  (From admission, onward)         Start     Ordered   07/05/20 1418  For home use only DME Walker rolling  Once       Question Answer Comment  Walker: With 5 Inch Wheels   Patient needs a walker to treat with the following condition Left-sided weakness      07/05/20 1418          Allergies  Allergen Reactions  . Levothyroxine Anaphylaxis and Cough    Chronic cough  . Nsaids Other (See Comments)    D/t gastric ulcer  . Amoxicillin Itching and  Other (See Comments)    THRUSH Causes sores in mouth  . Ampicillin Other (See Comments)    THRUSH  . Penicillins Itching and Other (See Comments)    THRUSH Causes sores in mouth  . Strawberry Extract Swelling    LIPS SWELL  . Asa [Aspirin] Other (See Comments)    Bleeding   . Bactrim [Sulfamethoxazole-Trimethoprim] Hives, Itching and Other (See Comments)    GI upset  . Depakote [Divalproex Sodium]     Causes double vision and speech problems   .  Dilantin [Phenytoin] Other (See Comments)    Severe skin peeling  . Methocarbamol Other (See Comments)    dizziness  . Risperidone And Related Other (See Comments)    Hallucinations   . Sitagliptin Other (See Comments)    pancreatitis  . Strawberry (Diagnostic) Itching and Swelling  . Sulfa Antibiotics Other (See Comments)    Affects thyroid   . Tolmetin Other (See Comments)    D/t gastric ulcer  . Ultram [Tramadol Hcl] Other (See Comments)    D/t gastric ulcer  . Oatmeal Hives and Other (See Comments)    White spots/sores in mouth    Consultations:  Neuro   Procedures/Studies: EEG  Result Date: 07/04/2020 Charlsie Quest, MD     07/04/2020  5:12 PM Patient Name: Nicholas [name] MRN: [id number] Epilepsy Attending: Charlsie Quest Referring Physician/Provider: Dr. Caryl Pina Date: 07/04/2020 Duration: 24.02 mins Patient history: 59 year old male with history of seizures who presented with acute onset of left-sided weakness.  On examination patient was noted to have diffuse limb jerking with a blank stare.  EEG evaluate for seizures. Level of alertness: Awake, asleep AEDs during EEG study: None Technical aspects: This EEG study was done with scalp electrodes positioned according to the 10-20 International system of electrode placement. Electrical activity was acquired at a sampling rate of 500Hz  and reviewed with a high frequency filter of 70Hz  and a low frequency filter of 1Hz . EEG data were recorded continuously and digitally  stored. Description: The posterior dominant rhythm consists of 8 Hz activity of moderate voltage (25-35 uV) seen predominantly in posterior head regions, symmetric and reactive to eye opening and eye closing. Sleep was characterized by vertex waves, sleep spindles (12 to 14 Hz), maximal frontocentral region.   Hyperventilation and photic stimulation were not performed.   IMPRESSION: This study is within normal limits. No seizures or epileptiform discharges were seen throughout the recording. Charlsie Quest   CT Code Stroke CTA Head W/WO contrast  Result Date: 07/04/2020 CLINICAL DATA:  Code stroke EXAM: CT HEAD WITHOUT CONTRAST CT ANGIOGRAPHY OF THE HEAD AND NECK TECHNIQUE: Contiguous axial images were obtained from the base of the skull through the vertex without intravenous contrast. Multidetector CT imaging of the head and neck was performed using the standard protocol during bolus administration of intravenous contrast. Multiplanar CT image reconstructions and MIPs were obtained to evaluate the vascular anatomy. Carotid stenosis measurements (when applicable) are obtained utilizing NASCET criteria, using the distal internal carotid diameter as the denominator. CONTRAST:  100 mL Omnipaque 350 COMPARISON:  05/03/2020 FINDINGS: CT HEAD Brain: There is no acute intracranial hemorrhage, mass effect, or edema. Gray-white differentiation is preserved. There is no extra-axial fluid collection. Ventricles and sulci are within normal limits in size and configuration. Vascular: No hyperdense vessel or unexpected calcification. Skull: Calvarium is unremarkable. Sinuses/Orbits: No acute finding. Other: None. Review of the MIP images confirms the above findings CTA NECK Aortic arch: Great vessel origins are patent. There is direct origin of the left vertebral artery from the arch. Right carotid system: Patent. No measurable stenosis or evidence of dissection. Left carotid system: Patent. No measurable stenosis or  evidence of dissection. Vertebral arteries: Patent.  Right vertebral artery is dominant. Skeleton: Postoperative changes of anterior fusion at C4-C7. Degenerative changes of the cervical spine. Other neck: No mass or adenopathy. Upper chest: No apical lung mass. Review of the MIP images confirms the above findings CTA HEAD Anterior circulation: Intracranial internal carotid arteries are patent. Anterior and  middle cerebral arteries are patent. Posterior circulation: Intracranial vertebral arteries, basilar artery, and posterior cerebral arteries are patent. A right posterior communicating artery is present. Venous sinuses: Patent as allowed by contrast bolus timing. Review of the MIP images confirms the above findings IMPRESSION: No acute intracranial hemorrhage or evidence of acute infarction. ASPECT score is 10. No large vessel occlusion or hemodynamically significant stenosis. These results were communicated to Dr. Otelia Limes at 12:24 pmon 7/29/2021by text page via the [facility] messaging system. Electronically Signed   By: Guadlupe Spanish M.D.   On: 07/04/2020 12:33   CT Code Stroke CTA Neck W/WO contrast  Result Date: 07/04/2020 CLINICAL DATA:  Code stroke EXAM: CT HEAD WITHOUT CONTRAST CT ANGIOGRAPHY OF THE HEAD AND NECK TECHNIQUE: Contiguous axial images were obtained from the base of the skull through the vertex without intravenous contrast. Multidetector CT imaging of the head and neck was performed using the standard protocol during bolus administration of intravenous contrast. Multiplanar CT image reconstructions and MIPs were obtained to evaluate the vascular anatomy. Carotid stenosis measurements (when applicable) are obtained utilizing NASCET criteria, using the distal internal carotid diameter as the denominator. CONTRAST:  100 mL Omnipaque 350 COMPARISON:  05/03/2020 FINDINGS: CT HEAD Brain: There is no acute intracranial hemorrhage, mass effect, or edema. Gray-white differentiation is preserved. There  is no extra-axial fluid collection. Ventricles and sulci are within normal limits in size and configuration. Vascular: No hyperdense vessel or unexpected calcification. Skull: Calvarium is unremarkable. Sinuses/Orbits: No acute finding. Other: None. Review of the MIP images confirms the above findings CTA NECK Aortic arch: Great vessel origins are patent. There is direct origin of the left vertebral artery from the arch. Right carotid system: Patent. No measurable stenosis or evidence of dissection. Left carotid system: Patent. No measurable stenosis or evidence of dissection. Vertebral arteries: Patent.  Right vertebral artery is dominant. Skeleton: Postoperative changes of anterior fusion at C4-C7. Degenerative changes of the cervical spine. Other neck: No mass or adenopathy. Upper chest: No apical lung mass. Review of the MIP images confirms the above findings CTA HEAD Anterior circulation: Intracranial internal carotid arteries are patent. Anterior and middle cerebral arteries are patent. Posterior circulation: Intracranial vertebral arteries, basilar artery, and posterior cerebral arteries are patent. A right posterior communicating artery is present. Venous sinuses: Patent as allowed by contrast bolus timing. Review of the MIP images confirms the above findings IMPRESSION: No acute intracranial hemorrhage or evidence of acute infarction. ASPECT score is 10. No large vessel occlusion or hemodynamically significant stenosis. These results were communicated to Dr. Otelia Limes at 12:24 pmon 7/29/2021by text page via the [facility] messaging system. Electronically Signed   By: Guadlupe Spanish M.D.   On: 07/04/2020 12:33   MR BRAIN WO CONTRAST  Result Date: 07/04/2020 CLINICAL DATA:  Neuro deficit, acute, stroke suspected. Additional history obtained from electronic MEDICAL RECORD NUMBERSyncope, patient found to have slurred speech and left-sided weakness, patient with seizure history. EXAM: MRI HEAD WITHOUT CONTRAST  TECHNIQUE: Multiplanar, multiecho pulse sequences of the brain and surrounding structures were obtained without intravenous contrast. COMPARISON:  Noncontrast head CT and CT angiogram head/neck performed earlier the same day, brain MRI 09/16/2018 FINDINGS: Brain: Cerebral volume is normal for age. No focal parenchymal signal abnormality is identified. There is no acute infarct. No evidence of intracranial mass. No chronic intracranial blood products. No extra-axial fluid collection. No midline shift. Vascular: Expected proximal arterial flow voids. Skull and upper cervical spine: There redemonstrated nonspecific heterogeneous marrow of the visualized upper cervical  spine. No focal suspicious marrow lesion. Sinuses/Orbits: Visualized orbits show no acute finding. Trace ethmoid sinus mucosal thickening. No significant mastoid effusion IMPRESSION: No evidence of acute intracranial abnormality, including acute infarction. Redemonstrated nonspecific heterogeneous marrow signal within the visualized upper cervical spine without focal suspicious osseous lesion. Minimal ethmoid sinus mucosal thickening. Electronically Signed   By: Jackey Loge DO   On: 07/04/2020 13:17   CT HEAD CODE STROKE WO CONTRAST  Result Date: 07/04/2020 CLINICAL DATA:  Code stroke EXAM: CT HEAD WITHOUT CONTRAST CT ANGIOGRAPHY OF THE HEAD AND NECK TECHNIQUE: Contiguous axial images were obtained from the base of the skull through the vertex without intravenous contrast. Multidetector CT imaging of the head and neck was performed using the standard protocol during bolus administration of intravenous contrast. Multiplanar CT image reconstructions and MIPs were obtained to evaluate the vascular anatomy. Carotid stenosis measurements (when applicable) are obtained utilizing NASCET criteria, using the distal internal carotid diameter as the denominator. CONTRAST:  100 mL Omnipaque 350 COMPARISON:  05/03/2020 FINDINGS: CT HEAD Brain: There is no acute  intracranial hemorrhage, mass effect, or edema. Gray-white differentiation is preserved. There is no extra-axial fluid collection. Ventricles and sulci are within normal limits in size and configuration. Vascular: No hyperdense vessel or unexpected calcification. Skull: Calvarium is unremarkable. Sinuses/Orbits: No acute finding. Other: None. Review of the MIP images confirms the above findings CTA NECK Aortic arch: Great vessel origins are patent. There is direct origin of the left vertebral artery from the arch. Right carotid system: Patent. No measurable stenosis or evidence of dissection. Left carotid system: Patent. No measurable stenosis or evidence of dissection. Vertebral arteries: Patent.  Right vertebral artery is dominant. Skeleton: Postoperative changes of anterior fusion at C4-C7. Degenerative changes of the cervical spine. Other neck: No mass or adenopathy. Upper chest: No apical lung mass. Review of the MIP images confirms the above findings CTA HEAD Anterior circulation: Intracranial internal carotid arteries are patent. Anterior and middle cerebral arteries are patent. Posterior circulation: Intracranial vertebral arteries, basilar artery, and posterior cerebral arteries are patent. A right posterior communicating artery is present. Venous sinuses: Patent as allowed by contrast bolus timing. Review of the MIP images confirms the above findings IMPRESSION: No acute intracranial hemorrhage or evidence of acute infarction. ASPECT score is 10. No large vessel occlusion or hemodynamically significant stenosis. These results were communicated to Dr. Otelia Limes at 12:24 pmon 7/29/2021by text page via the [facility] messaging system. Electronically Signed   By: Guadlupe Spanish M.D.   On: 07/04/2020 12:33      Subjective: No acute issues/events overnight, denies nausea, vomiting, chest pain, shortness of breath, headache, fevers, chills   Discharge Exam: Vitals:   07/05/20 2307 07/06/20 0343  BP: (!)  118/90 (!) 115/88  Pulse: 90 94  Resp: 18 18  Temp: 97.7 F (36.5 C) 97.8 F (36.6 C)  SpO2: 98% 100%   Vitals:   07/05/20 1617 07/05/20 1920 07/05/20 2307 07/06/20 0343  BP:  112/79 (!) 118/90 (!) 115/88  Pulse: 89 91 90 94  Resp: 16 18 18 18   Temp: 98.3 F (36.8 C) 98.6 F (37 C) 97.7 F (36.5 C) 97.8 F (36.6 C)  TempSrc: Oral Oral Oral Oral  SpO2: 97% 97% 98% 100%  Weight:        General: Pt is alert, awake, not in acute distress Cardiovascular: RRR, S1/S2 +, no rubs, no gallops Respiratory: CTA bilaterally, no wheezing, no rhonchi Abdominal: Soft, NT, ND, bowel sounds + Extremities: no edema,  no cyanosis    The results of significant diagnostics from this hospitalization (including imaging, microbiology, ancillary and laboratory) are listed below for reference.     Microbiology: Recent Results (from the past 240 hour(s))  SARS Coronavirus 2 by RT PCR (hospital order, performed in [facility] hospital lab) Nasopharyngeal Nasopharyngeal Swab     Status: None   Collection Time: 07/04/20  5:28 PM   Specimen: Nasopharyngeal Swab  Result Value Ref Range Status   SARS Coronavirus 2 NEGATIVE NEGATIVE Final    Comment: (NOTE) SARS-CoV-2 target nucleic acids are NOT DETECTED.  The SARS-CoV-2 RNA is generally detectable in upper and lower respiratory specimens during the acute phase of infection. The lowest concentration of SARS-CoV-2 viral copies this assay can detect is 250 copies / mL. A negative result does not preclude SARS-CoV-2 infection and should not be used as the sole basis for treatment or other patient management decisions.  A negative result may occur with improper specimen collection / handling, submission of specimen other than nasopharyngeal swab, presence of viral mutation(s) within the areas targeted by this assay, and inadequate number of viral copies (<250 copies / mL). A negative result must be combined with clinical observations, patient history,  and epidemiological information.  Fact Sheet for Patients:   BoilerBrush.com.cy  Fact Sheet for Healthcare Providers: https://pope.com/  This test is not yet approved or  cleared by the [place] FDA and has been authorized for detection and/or diagnosis of SARS-CoV-2 by FDA under an Emergency Use Authorization (EUA).  This EUA will remain in effect (meaning this test can be used) for the duration of the COVID-19 declaration under Section 564(b)(1) of the Act, 21 U.S.C. section 360bbb-3(b)(1), unless the authorization is terminated or revoked sooner.  Performed at [facility], [address], [place], [place] [address]      Labs: BNP (last 3 results) No results for input(s): BNP in the last 8760 hours. Basic Metabolic Panel: Recent Labs  Lab 07/04/20 1157 07/04/20 1159 07/05/20 0401 07/06/20 0306  NA 136 141 138 137  K 4.9 4.9 4.3 4.0  CL 105 108 109 106  CO2 20*  --  20* 21*  GLUCOSE 170* 166* 95 173*  BUN 32* 34* 22* 18  CREATININE 1.79* 1.80* 1.20 1.35*  CALCIUM 9.7  --  9.9 9.6   Liver Function Tests: Recent Labs  Lab 07/04/20 1157 07/06/20 0306  AST 22 23  ALT 33 32  ALKPHOS 67 60  BILITOT 0.4 0.6  PROT 7.8 7.4  ALBUMIN 4.6 4.1   No results for input(s): LIPASE, AMYLASE in the last 168 hours. No results for input(s): AMMONIA in the last 168 hours. CBC: Recent Labs  Lab 07/04/20 1157 07/04/20 1159 07/05/20 0401 07/06/20 0306  WBC 6.5  --  4.4 5.1  NEUTROABS 3.9  --   --   --   HGB 13.0 14.6 12.8* 13.0  HCT 41.9 43.0 41.0 40.9  MCV 87.3  --  85.6 84.9  PLT 169  --  PLATELET CLUMPS NOTED ON SMEAR, UNABLE TO ESTIMATE 171   Cardiac Enzymes: No results for input(s): CKTOTAL, CKMB, CKMBINDEX, TROPONINI in the last 168 hours. BNP: Invalid input(s): POCBNP CBG: Recent Labs  Lab 07/05/20 0615 07/05/20 1126 07/05/20 1619 07/05/20 2108 07/06/20 0613  GLUCAP 113* 136* 145* 212* 117*    D-Dimer No results for input(s): DDIMER in the last 72 hours. Hgb A1c Recent Labs    07/04/20 1432  HGBA1C 6.7*   Lipid  Profile No results for input(s): CHOL, HDL, LDLCALC, TRIG, CHOLHDL, LDLDIRECT in the last 72 hours. Thyroid function studies Recent Labs    07/05/20 0401  TSH 4.244   Anemia work up No results for input(s): VITAMINB12, FOLATE, FERRITIN, TIBC, IRON, RETICCTPCT in the last 72 hours. Urinalysis    Component Value Date/Time   COLORURINE YELLOW 05/03/2020 0250   APPEARANCEUR CLEAR 05/03/2020 0250   LABSPEC 1.021 05/03/2020 0250   PHURINE 5.0 05/03/2020 0250   GLUCOSEU 50 (A) 05/03/2020 0250   HGBUR NEGATIVE 05/03/2020 0250   BILIRUBINUR NEGATIVE 05/03/2020 0250   KETONESUR NEGATIVE 05/03/2020 0250   PROTEINUR NEGATIVE 05/03/2020 0250   UROBILINOGEN 0.2 08/29/2015 1125   NITRITE NEGATIVE 05/03/2020 0250   LEUKOCYTESUR NEGATIVE 05/03/2020 0250   Sepsis Labs Invalid input(s): PROCALCITONIN,  WBC,  LACTICIDVEN Microbiology Recent Results (from the past 240 hour(s))  SARS Coronavirus 2 by RT PCR (hospital order, performed in [facility] hospital lab) Nasopharyngeal Nasopharyngeal Swab     Status: None   Collection Time: 07/04/20  5:28 PM   Specimen: Nasopharyngeal Swab  Result Value Ref Range Status   SARS Coronavirus 2 NEGATIVE NEGATIVE Final    Comment: (NOTE) SARS-CoV-2 target nucleic acids are NOT DETECTED.  The SARS-CoV-2 RNA is generally detectable in upper and lower respiratory specimens during the acute phase of infection. The lowest concentration of SARS-CoV-2 viral copies this assay can detect is 250 copies / mL. A negative result does not preclude SARS-CoV-2 infection and should not be used as the sole basis for treatment or other patient management decisions.  A negative result may occur with improper specimen collection / handling, submission of specimen other than nasopharyngeal swab, presence of viral mutation(s) within the areas  targeted by this assay, and inadequate number of viral copies (<250 copies / mL). A negative result must be combined with clinical observations, patient history, and epidemiological information.  Fact Sheet for Patients:   BoilerBrush.com.cy  Fact Sheet for Healthcare Providers: https://pope.com/  This test is not yet approved or  cleared by the [place] FDA and has been authorized for detection and/or diagnosis of SARS-CoV-2 by FDA under an Emergency Use Authorization (EUA).  This EUA will remain in effect (meaning this test can be used) for the duration of the COVID-19 declaration under Section 564(b)(1) of the Act, 21 U.S.C. section 360bbb-3(b)(1), unless the authorization is terminated or revoked sooner.  Performed at [facility], [address] [place], [place] [address]      Time coordinating discharge: Over 30 minutes  SIGNED:   Azucena Fallen, DO [facility] 07/06/2020, 7:51 AM Pager   If 7PM-7AM, please contact night-coverage [email or web address]

## 2020-07-06 NOTE — Progress Notes (Signed)
Discharge instructions reviewed with pt and all questions answered.  Charge nurse went to pharmacy to pick up scripts and he is going to go by cab.

## 2020-07-18 ENCOUNTER — Encounter (HOSPITAL_COMMUNITY): Payer: Self-pay | Admitting: Obstetrics and Gynecology

## 2020-07-18 ENCOUNTER — Other Ambulatory Visit: Payer: Self-pay

## 2020-07-18 ENCOUNTER — Emergency Department (HOSPITAL_COMMUNITY)
Admission: EM | Admit: 2020-07-18 | Discharge: 2020-07-18 | Disposition: A | Discharge status: Home / Self Care | Payer: Medicare Other | Attending: Emergency Medicine | Admitting: Emergency Medicine

## 2020-07-18 DIAGNOSIS — R569 Unspecified convulsions: Secondary | ICD-10-CM | POA: Diagnosis not present

## 2020-07-18 DIAGNOSIS — Z5321 Procedure and treatment not carried out due to patient leaving prior to being seen by health care provider: Secondary | ICD-10-CM | POA: Insufficient documentation

## 2020-07-18 DIAGNOSIS — Z8673 Personal history of transient ischemic attack (TIA), and cerebral infarction without residual deficits: Secondary | ICD-10-CM | POA: Insufficient documentation

## 2020-07-18 LAB — CBC
HCT: 37.7 % — ABNORMAL LOW (ref 39.0–52.0)
Hemoglobin: 11.8 g/dL — ABNORMAL LOW (ref 13.0–17.0)
MCH: 27.5 pg (ref 26.0–34.0)
MCHC: 31.3 g/dL (ref 30.0–36.0)
MCV: 87.9 fL (ref 80.0–100.0)
Platelets: 171 10*3/uL (ref 150–400)
RBC: 4.29 MIL/uL (ref 4.22–5.81)
RDW: 16.5 % — ABNORMAL HIGH (ref 11.5–15.5)
WBC: 5.9 10*3/uL (ref 4.0–10.5)
nRBC: 0 % (ref 0.0–0.2)

## 2020-07-18 LAB — BASIC METABOLIC PANEL
Anion gap: 8 (ref 5–15)
BUN: 14 mg/dL (ref 6–20)
CO2: 22 mmol/L (ref 22–32)
Calcium: 9.2 mg/dL (ref 8.9–10.3)
Chloride: 109 mmol/L (ref 98–111)
Creatinine, Ser: 1.33 mg/dL — ABNORMAL HIGH (ref 0.61–1.24)
GFR calc Af Amer: >60 mL/min (ref >60)
GFR calc non Af Amer: 59 mL/min — ABNORMAL LOW (ref >60)
Glucose, Bld: 133 mg/dL — ABNORMAL HIGH (ref 70–99)
Potassium: 4.7 mmol/L (ref 3.5–5.1)
Sodium: 139 mmol/L (ref 135–145)

## 2020-07-18 NOTE — ED Triage Notes (Signed)
Patient BIB EMS for seizures. Patient reportedly was at a doctor's office and started shaking and they reported a [employer] seizure. When EMS arrived on scene they were told he was having one but patient was able to talk and could be re-directed from his seizure. Patient has a hx of bipolar. Patient also has a hx of stroke.

## 2020-07-21 ENCOUNTER — Emergency Department (HOSPITAL_COMMUNITY): Payer: Medicare Other

## 2020-07-21 ENCOUNTER — Inpatient Hospital Stay (HOSPITAL_COMMUNITY)
Admission: EM | Admit: 2020-07-21 | Discharge: 2020-07-25 | DRG: 100 | Disposition: A | Discharge status: Home Health | Payer: Medicare Other | Source: Ambulatory Visit | Attending: Internal Medicine | Admitting: Internal Medicine

## 2020-07-21 ENCOUNTER — Encounter (HOSPITAL_COMMUNITY): Payer: Self-pay | Admitting: *Deleted

## 2020-07-21 ENCOUNTER — Other Ambulatory Visit: Payer: Self-pay

## 2020-07-21 DIAGNOSIS — K219 Gastro-esophageal reflux disease without esophagitis: Secondary | ICD-10-CM | POA: Diagnosis present

## 2020-07-21 DIAGNOSIS — J45909 Unspecified asthma, uncomplicated: Secondary | ICD-10-CM | POA: Diagnosis present

## 2020-07-21 DIAGNOSIS — F319 Bipolar disorder, unspecified: Secondary | ICD-10-CM | POA: Diagnosis present

## 2020-07-21 DIAGNOSIS — Z981 Arthrodesis status: Secondary | ICD-10-CM

## 2020-07-21 DIAGNOSIS — M549 Dorsalgia, unspecified: Secondary | ICD-10-CM | POA: Diagnosis present

## 2020-07-21 DIAGNOSIS — Z841 Family history of disorders of kidney and ureter: Secondary | ICD-10-CM

## 2020-07-21 DIAGNOSIS — R569 Unspecified convulsions: Principal | ICD-10-CM | POA: Diagnosis present

## 2020-07-21 DIAGNOSIS — I129 Hypertensive chronic kidney disease with stage 1 through stage 4 chronic kidney disease, or unspecified chronic kidney disease: Secondary | ICD-10-CM | POA: Diagnosis present

## 2020-07-21 DIAGNOSIS — D631 Anemia in chronic kidney disease: Secondary | ICD-10-CM | POA: Diagnosis present

## 2020-07-21 DIAGNOSIS — R531 Weakness: Secondary | ICD-10-CM | POA: Diagnosis present

## 2020-07-21 DIAGNOSIS — E1140 Type 2 diabetes mellitus with diabetic neuropathy, unspecified: Secondary | ICD-10-CM

## 2020-07-21 DIAGNOSIS — I10 Essential (primary) hypertension: Secondary | ICD-10-CM | POA: Diagnosis present

## 2020-07-21 DIAGNOSIS — Z8249 Family history of ischemic heart disease and other diseases of the circulatory system: Secondary | ICD-10-CM

## 2020-07-21 DIAGNOSIS — R29810 Facial weakness: Secondary | ICD-10-CM | POA: Diagnosis present

## 2020-07-21 DIAGNOSIS — Z794 Long term (current) use of insulin: Secondary | ICD-10-CM

## 2020-07-21 DIAGNOSIS — Z880 Allergy status to penicillin: Secondary | ICD-10-CM

## 2020-07-21 DIAGNOSIS — R4781 Slurred speech: Secondary | ICD-10-CM | POA: Diagnosis present

## 2020-07-21 DIAGNOSIS — R2990 Unspecified symptoms and signs involving the nervous system: Secondary | ICD-10-CM

## 2020-07-21 DIAGNOSIS — R471 Dysarthria and anarthria: Secondary | ICD-10-CM | POA: Diagnosis present

## 2020-07-21 DIAGNOSIS — Z7902 Long term (current) use of antithrombotics/antiplatelets: Secondary | ICD-10-CM

## 2020-07-21 DIAGNOSIS — E0590 Thyrotoxicosis, unspecified without thyrotoxic crisis or storm: Secondary | ICD-10-CM | POA: Diagnosis present

## 2020-07-21 DIAGNOSIS — Z882 Allergy status to sulfonamides status: Secondary | ICD-10-CM

## 2020-07-21 DIAGNOSIS — H40009 Preglaucoma, unspecified, unspecified eye: Secondary | ICD-10-CM | POA: Diagnosis present

## 2020-07-21 DIAGNOSIS — R4182 Altered mental status, unspecified: Secondary | ICD-10-CM | POA: Diagnosis not present

## 2020-07-21 DIAGNOSIS — Z833 Family history of diabetes mellitus: Secondary | ICD-10-CM

## 2020-07-21 DIAGNOSIS — R200 Anesthesia of skin: Secondary | ICD-10-CM | POA: Diagnosis present

## 2020-07-21 DIAGNOSIS — N182 Chronic kidney disease, stage 2 (mild): Secondary | ICD-10-CM | POA: Diagnosis present

## 2020-07-21 DIAGNOSIS — Z8616 Personal history of COVID-19: Secondary | ICD-10-CM

## 2020-07-21 DIAGNOSIS — H53461 Homonymous bilateral field defects, right side: Secondary | ICD-10-CM | POA: Diagnosis present

## 2020-07-21 DIAGNOSIS — G43109 Migraine with aura, not intractable, without status migrainosus: Secondary | ICD-10-CM | POA: Diagnosis present

## 2020-07-21 DIAGNOSIS — G9341 Metabolic encephalopathy: Secondary | ICD-10-CM | POA: Diagnosis present

## 2020-07-21 DIAGNOSIS — Z888 Allergy status to other drugs, medicaments and biological substances status: Secondary | ICD-10-CM

## 2020-07-21 DIAGNOSIS — Z87891 Personal history of nicotine dependence: Secondary | ICD-10-CM

## 2020-07-21 DIAGNOSIS — E119 Type 2 diabetes mellitus without complications: Secondary | ICD-10-CM

## 2020-07-21 DIAGNOSIS — Z79899 Other long term (current) drug therapy: Secondary | ICD-10-CM

## 2020-07-21 DIAGNOSIS — Z8673 Personal history of transient ischemic attack (TIA), and cerebral infarction without residual deficits: Secondary | ICD-10-CM

## 2020-07-21 DIAGNOSIS — N183 Chronic kidney disease, stage 3 unspecified: Secondary | ICD-10-CM | POA: Diagnosis present

## 2020-07-21 DIAGNOSIS — I444 Left anterior fascicular block: Secondary | ICD-10-CM | POA: Diagnosis present

## 2020-07-21 DIAGNOSIS — N1831 Chronic kidney disease, stage 3a: Secondary | ICD-10-CM | POA: Diagnosis present

## 2020-07-21 DIAGNOSIS — Z7951 Long term (current) use of inhaled steroids: Secondary | ICD-10-CM

## 2020-07-21 DIAGNOSIS — E785 Hyperlipidemia, unspecified: Secondary | ICD-10-CM | POA: Diagnosis present

## 2020-07-21 DIAGNOSIS — E1122 Type 2 diabetes mellitus with diabetic chronic kidney disease: Secondary | ICD-10-CM | POA: Diagnosis present

## 2020-07-21 LAB — I-STAT CHEM 8, ED
BUN: 20 mg/dL (ref 6–20)
Calcium, Ion: 1.29 mmol/L (ref 1.15–1.40)
Chloride: 111 mmol/L (ref 98–111)
Creatinine, Ser: 1.2 mg/dL (ref 0.61–1.24)
Glucose, Bld: 83 mg/dL (ref 70–99)
HCT: 35 % — ABNORMAL LOW (ref 39.0–52.0)
Hemoglobin: 11.9 g/dL — ABNORMAL LOW (ref 13.0–17.0)
Potassium: 4 mmol/L (ref 3.5–5.1)
Sodium: 143 mmol/L (ref 135–145)
TCO2: 20 mmol/L — ABNORMAL LOW (ref 22–32)

## 2020-07-21 LAB — DIFFERENTIAL
Abs Immature Granulocytes: 0.04 10*3/uL (ref 0.00–0.07)
Basophils Absolute: 0 10*3/uL (ref 0.0–0.1)
Basophils Relative: 1 %
Eosinophils Absolute: 0.1 10*3/uL (ref 0.0–0.5)
Eosinophils Relative: 2 %
Immature Granulocytes: 1 %
Lymphocytes Relative: 31 %
Lymphs Abs: 1.8 10*3/uL (ref 0.7–4.0)
Monocytes Absolute: 0.5 10*3/uL (ref 0.1–1.0)
Monocytes Relative: 8 %
Neutro Abs: 3.5 10*3/uL (ref 1.7–7.7)
Neutrophils Relative %: 57 %

## 2020-07-21 LAB — COMPREHENSIVE METABOLIC PANEL
ALT: 30 U/L (ref 0–44)
AST: 23 U/L (ref 15–41)
Albumin: 4 g/dL (ref 3.5–5.0)
Alkaline Phosphatase: 54 U/L (ref 38–126)
Anion gap: 9 (ref 5–15)
BUN: 18 mg/dL (ref 6–20)
CO2: 22 mmol/L (ref 22–32)
Calcium: 9.6 mg/dL (ref 8.9–10.3)
Chloride: 109 mmol/L (ref 98–111)
Creatinine, Ser: 1.3 mg/dL — ABNORMAL HIGH (ref 0.61–1.24)
GFR calc Af Amer: >60 mL/min (ref >60)
GFR calc non Af Amer: >60 mL/min (ref >60)
Glucose, Bld: 88 mg/dL (ref 70–99)
Potassium: 4.1 mmol/L (ref 3.5–5.1)
Sodium: 140 mmol/L (ref 135–145)
Total Bilirubin: 0.3 mg/dL (ref 0.3–1.2)
Total Protein: 6.6 g/dL (ref 6.5–8.1)

## 2020-07-21 LAB — CBC
HCT: 35 % — ABNORMAL LOW (ref 39.0–52.0)
Hemoglobin: 11 g/dL — ABNORMAL LOW (ref 13.0–17.0)
MCH: 27.3 pg (ref 26.0–34.0)
MCHC: 31.4 g/dL (ref 30.0–36.0)
MCV: 86.8 fL (ref 80.0–100.0)
Platelets: 151 10*3/uL (ref 150–400)
RBC: 4.03 MIL/uL — ABNORMAL LOW (ref 4.22–5.81)
RDW: 16.3 % — ABNORMAL HIGH (ref 11.5–15.5)
WBC: 5.9 10*3/uL (ref 4.0–10.5)
nRBC: 0 % (ref 0.0–0.2)

## 2020-07-21 LAB — SARS CORONAVIRUS 2 BY RT PCR (HOSPITAL ORDER, PERFORMED IN ~~LOC~~ HOSPITAL LAB): SARS Coronavirus 2: NEGATIVE

## 2020-07-21 LAB — CBG MONITORING, ED: Glucose-Capillary: 85 mg/dL (ref 70–99)

## 2020-07-21 LAB — PROTIME-INR
INR: 1.1 (ref 0.8–1.2)
Prothrombin Time: 14.2 seconds (ref 11.4–15.2)

## 2020-07-21 LAB — APTT: aPTT: 30 seconds (ref 24–36)

## 2020-07-21 MED ORDER — SODIUM CHLORIDE 0.9% FLUSH
3.0000 mL | Freq: Once | INTRAVENOUS | Status: AC
  Administered 2020-07-22: 3 mL via INTRAVENOUS

## 2020-07-21 MED ORDER — IOHEXOL 350 MG/ML SOLN
100.0000 mL | Freq: Once | INTRAVENOUS | Status: AC | PRN
  Administered 2020-07-21: 100 mL via INTRAVENOUS

## 2020-07-21 MED ORDER — SODIUM CHLORIDE 0.9 % IV SOLN
3000.0000 mg | Freq: Once | INTRAVENOUS | Status: AC
  Administered 2020-07-21: 3000 mg via INTRAVENOUS
  Filled 2020-07-21: qty 30

## 2020-07-21 NOTE — Consult Note (Addendum)
NEURO HOSPITALIST CONSULT NOTE   Requestig physician: Dr. Dalene SeltzerSchlossman  Reason for Consult: Right sided weakness  History obtained from:  Chart     HPI:                                                                                                                                          Nicholas [name] is an 59 y.o. male with a PMHx of bipolar 1 disorder, borderline glaucoma, epilepsy (no Neurologist, sees his PCP for this diagnosis), HTN, hyperthyroidism, migraine, TIA and DM2, who is brought in by EMS as a Code Stroke for acute onset of right sided weakness in the context of seizure activity. Per EMS, he was at his doctor's office, where he started shaking and reportedly had a grand mal seizure. EMS was called and on their arrival, the patient was weak on the right but able to talk. While in the ambulance, he started "shaking all over" per EMS, with movements being worse on the left, in conjunction with leftward eye deviation. He was administered 2.5 mg IV Versed and the activity stopped. On arrival to the ED, the patient was awake with eyes open, staring straight forward, not answering questions.    STAT head CT was negative for acute abnormality.   STAT CTA of head and neck revealed no LVO.   Of note, the patient is well-known to the Neurology service, he was last seen in consultation by our service on 7/29, at which time he had presented for acute left sided weakness. When [facility] had [facility], he had onset of diffuse limb jerking with a blank stare; the jerking movements were of varying amplitude and frequency, also varying with tactile stimulation. After the jerking spell, which lasted for about 30 seconds, the patient's speech was dysarthric, slow and stuttering, atypical for a lesional aphasia and most consistent with embellishment. Work up included CT and MRI which showed no acute changes, as well as a normal EEG.  He was started on Zonegran and his  Keppra dosing was increased to 1000 mg BID. During his 7/29 hospitalization, the patient had stated that he has had seizures since he was 59 years old and sees Dr Rosario JacksElaine Feraru for management. Dr. Melynda RippleYadav had reviewed Dr. Lewis MoccasinFeraru's note from 05/17/2020. Patient has had EMU admission in past where spells were captured and were non epileptic. He has also had episodes of transient left and right sided weakness which gradually resolves. Per Dr. Candi LeashYadav's progress note from 7/30, the plan was to continue him on Keppra 1000 BID and Zonegran 200 mg BID at discharge, continue Neurontin for paresthesias, and follow up with Dr. Alton RevereFeraru in 8-12 weeks. If seizures recurred, Dr. Melynda RippleYadav felt that repeat EMU evaluation could be considered.  Current home medications include Plavix, atorvastatin, Zonegran (200 mg BID) and Keppra (1000 mg BID), as well as Neurontin 600 mg BID.   Past Medical History:  Diagnosis Date  . Asthma   . Bipolar 1 disorder (HCC)   . Borderline glaucoma   . Chronic pain   . Epididymal pain    LEFT  . 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 Feeling of incomplete bladder emptying   . Frequency of urination   . Gastric ulcer   . GERD (gastroesophageal reflux disease)   . Hypertension   . Hyperthyroidism    NO MEDS   . Migraine   . Seizures (HCC)   . TIA (transient ischemic attack)   . Type 2 diabetes mellitus (HCC)     Past Surgical History:  Procedure Laterality Date  . ABDOMINAL SURGERY    . ANTERIOR CERVICAL DECOMP/DISCECTOMY FUSION  2007   C4  --  C6  . CERVICAL FUSION    . CHOLECYSTECTOMY    . CYSTOSCOPY N/A 11/09/2013   Procedure: CYSTOSCOPY FLEXIBLE;  Surgeon: Bjorn Pippin, MD;  [name]  Service: Urology;  Laterality: N/A;  . EPIDIDYMECTOMY Left 11/09/2013   Procedure:  LEFT EPIDIDYMECTOMY;  Surgeon: Bjorn Pippin, MD;  Location: [facility];   Service: Urology;  Laterality: Left;  POSSIBLE OUTPATIENT WITH OBSERVATION  . EXCISION LIPOMA LEFT SHOULDER  2004 (APPROX)  . MULTIPLE CYST REMOVED FROM CHEST  AGE 64  . OTHER SURGICAL HISTORY     hemorroid surgery   . TESTICLE REMOVAL Left   . TONSILLECTOMY      Family History  Problem Relation Age of Onset  . Heart failure Mother   . Diabetes Mother   . Hypertension Mother   . Cirrhosis Father   . Heart failure Father   . Kidney disease Father               Social History:  reports that he quit smoking about 27 years ago. His smoking use included cigarettes. He has a 3.75 pack-year smoking history. He has never used smokeless tobacco. He reports that he does not drink alcohol and does not use drugs.  Allergies  Allergen Reactions  . Levothyroxine Anaphylaxis and Cough    Chronic cough  . Nsaids Other (See Comments)    D/t gastric ulcer  . Amoxicillin Itching and Other (See Comments)    THRUSH Causes sores in mouth  . Ampicillin Other (See Comments)    THRUSH  . Penicillins Itching and Other (See Comments)    THRUSH Causes sores in mouth  . Strawberry Extract Swelling    LIPS SWELL  . Asa [Aspirin] Other (See Comments)    Bleeding   . Bactrim [Sulfamethoxazole-Trimethoprim] Hives, Itching and Other (See Comments)    GI upset  . Depakote [Divalproex Sodium]     Causes double vision and speech problems   . Dilantin [Phenytoin] Other (See Comments)    Severe skin peeling  . Methocarbamol Other (See Comments)    dizziness  . Risperidone And Related Other (See Comments)    Hallucinations   . Sitagliptin Other (See Comments)    pancreatitis  . Strawberry (Diagnostic) Itching and Swelling  . Sulfa Antibiotics Other (See Comments)    Affects thyroid   . Tolmetin Other (See Comments)    D/t gastric ulcer  .  Ultram [Tramadol Hcl] Other (See Comments)    D/t gastric ulcer  . Oatmeal Hives and Other (See Comments)    White spots/sores in mouth    HOME  MEDICATIONS:                                                                                                                      No current facility-administered medications on file prior to encounter.   Current Outpatient Medications on File Prior to Encounter  Medication Sig Dispense Refill  . acetaminophen (TYLENOL 8 HOUR) 650 MG CR tablet Take 1 tablet (650 mg total) by mouth every 8 (eight) hours. 30 tablet 0  . albuterol (PROVENTIL HFA;VENTOLIN HFA) 108 (90 Base) MCG/ACT inhaler Inhale 1-2 puffs into the lungs every 6 (six) hours as needed for wheezing or shortness of breath. 1 Inhaler 0  . atorvastatin (LIPITOR) 80 MG tablet Take 80 mg by mouth at bedtime.     . budesonide-formoterol (SYMBICORT) 80-4.5 MCG/ACT inhaler Inhale 2 puffs into the lungs daily.    . clopidogrel (PLAVIX) 75 MG tablet Take 1 tablet (75 mg total) by mouth daily. 30 tablet 01  . ergocalciferol (VITAMIN D2) 1.25 MG (50000 UT) capsule Take 50,000 Units by mouth every Monday.    . fenofibrate (TRICOR) 145 MG tablet Take 145 mg by mouth daily.    . fluticasone-salmeterol (ADVAIR HFA) 115-21 MCG/ACT inhaler Inhale 2 puffs into the lungs daily.    [name] gabapentin (NEURONTIN) 300 MG capsule Take 2 capsules (600 mg total) by mouth 2 (two) times daily. 90 capsule 01  . HUMALOG KWIKPEN 200 UNIT/ML KwikPen Inject 0-10 Units into the skin 3 (three) times daily with meals. 1 pen 01  . HYDROcodone-acetaminophen (NORCO) 10-325 MG tablet Take 1 tablet by mouth 4 (four) times daily.    [name] latanoprost (XALATAN) 0.005 % ophthalmic solution Place 1 drop into both eyes at bedtime.     . levETIRAcetam (KEPPRA) 1000 MG tablet Take 1 tablet (1,000 mg total) by mouth 2 (two) times daily. 60 tablet 01  . metFORMIN (GLUCOPHAGE) 1000 MG tablet Take 1 tablet (1,000 mg total) by mouth 2 (two) times daily. 60 tablet 01  . nortriptyline (PAMELOR) 50 MG capsule Take 100 mg by mouth at bedtime.     . pantoprazole (PROTONIX) 40 MG tablet Take 40 mg by  mouth 2 (two) times daily.    . polyethylene glycol powder (GLYCOLAX/MIRALAX) 17 GM/SCOOP powder Take 17 g by mouth daily as needed for mild constipation.    . rizatriptan (MAXALT) 10 MG tablet Take 10 mg by mouth daily. May repeat in 2 hours if needed     . sertraline (ZOLOFT) 50 MG tablet Take 50 mg by mouth daily.    . timolol (TIMOPTIC) 0.5 % ophthalmic solution Place 1 drop into both eyes 2 (two) times daily.     [name] tiZANidine (ZANAFLEX) 4 MG tablet Take 2-8 mg by mouth 4 (four) times daily as needed for muscle spasms.    [name]  valsartan (DIOVAN) 320 MG tablet Take 320 mg by mouth daily.    [name] zonisamide (ZONEGRAN) 100 MG capsule Take 200 mg by mouth 2 (two) times daily.        ROS:                                                                                                                                       Unable to obtain due to sparse, severely dysarthric verbal output.    Blood pressure 122/82, pulse 68, temperature 98 F (36.7 C), temperature source Oral, resp. rate 19, SpO2 100 %.   General Examination:                                                                                                       Physical Exam  HEENT-  [place]/AT  Lungs- Respirations unlabored  Extremities- No edema  Neurological Examination Mental Status:  Sedated after Versed administration by EMS. After CT was more awake and alert. Speech sparse, slow and severely dysarthric - the dysarthria and slow, labored quality of the speech output has a quality that is non-physiological and most consistent with embellishment.Able to follow all motor commands. Unable to test for orientation due to unintelligible speech alternating as well as extended pauses with eyes open and no verbal output in response to most questions.  Cranial Nerves: II: Eyes kept wide open by patient through most of exam. When testing blink to threat, his eyes subtly widen rather than blink in both temporal fields, consistently with  multiple trials - this appears most consistent with a volitional attempt by patient not to blink. PERRL.  III,IV, VI: No ptosis. Initially did not follow commands for eye movements, then tracked fully to left and right. No nystagmus.  V,VII: When asked to smile, left perioral musculature contracts tremulously into a grimace, while right perioral musculature remains fixed, although with the appearance of increased tone on the right transiently. Responds to tactile stimulation bilaterally.  VIII: Hearing intact to voice.  IX,X: Unable to visualize palate XI: Head is midline XII: Slow tongue protrusion is initially to one side, then corrects to midline when asked.  Motor: RUE with giveway weakness, maximum strength elicitable is 2/5 RLE with giveway weakness, maximum strength elicitable is 4/5 LUE and LLE 5/5 Sensory: States yes with each extremity. When asked if he can feel FT. No extinction to DSS.  Deep Tendon Reflexes: 2+ and symmetric throughout Plantars: Right: downgoing   Left: downgoing Cerebellar: No ataxia  with FNF on the left. Unable to obtain on the right.  Gait: Deferred   Lab Results: Basic Metabolic Panel: Recent Labs  Lab 07/18/20 1844 07/21/20 2133  NA 139 143  K 4.7 4.0  CL 109 111  CO2 22  --   GLUCOSE 133* 83  BUN 14 20  CREATININE 1.33* 1.20  CALCIUM 9.2  --     CBC: Recent Labs  Lab 07/18/20 1844 07/21/20 2124 07/21/20 2133  WBC 5.9 5.9  --   NEUTROABS  --  3.5  --   HGB 11.8* 11.0* 11.9*  HCT 37.7* 35.0* 35.0*  MCV 87.9 86.8  --   PLT 171 151  --     Cardiac Enzymes: No results for input(s): CKTOTAL, CKMB, CKMBINDEX, TROPONINI in the last 168 hours.  Lipid Panel: No results for input(s): CHOL, TRIG, HDL, CHOLHDL, VLDL, LDLCALC in the last 168 hours.  Imaging: CT HEAD CODE STROKE WO CONTRAST  Result Date: 07/21/2020 CLINICAL DATA:  Code stroke. Initial evaluation for acute right-sided weakness, slurred speech. EXAM: CT HEAD WITHOUT CONTRAST  TECHNIQUE: Contiguous axial images were obtained from the base of the skull through the vertex without intravenous contrast. COMPARISON:  Prior study from 07/04/2020. FINDINGS: Brain: Cerebral volume within normal limits for patient age. No evidence for acute intracranial hemorrhage. No findings to suggest acute large vessel territory infarct. No mass lesion, midline shift, or mass effect. Ventricles are normal in size without evidence for hydrocephalus. No extra-axial fluid collection identified. Vascular: No hyperdense vessel identified. Skull: Scalp soft tissues demonstrate no acute abnormality. Calvarium intact. Sinuses/Orbits: Globes and orbital soft tissues within normal limits. Visualized paranasal sinuses are clear. No mastoid effusion. ASPECTS [facility] Stroke Program Early CT Score) - Ganglionic level infarction (caudate, lentiform nuclei, internal capsule, insula, M1-M3 cortex): 7 - Supraganglionic infarction (M4-M6 cortex): 3 Total score (0-10 with 10 being normal): 10 IMPRESSION: 1. No acute intracranial infarct or other abnormality. 2. ASPECTS is 10. These results were communicated to Dr. Otelia Limes at 9:37 pmon 8/15/2021by text page via the [facility] messaging system. Electronically Signed   By: Rise Mu M.D.   On: 07/21/2020 21:39    Assessment: 59 y.o. male with a PMHx of bipolar 1 disorder, borderline glaucoma, epilepsy (no Neurologist, sees his PCP for this diagnosis), HTN, hyperthyroidism, migraine, TIA and DM2, who is brought in by EMS as a Code Stroke for acute onset of right sided weakness in the context of seizure activity. Per EMS, he was at his doctor's office, where he started shaking and reportedly had a grand mal seizure. EMS was called and on their arrival, the patient was weak on the right but able to talk. While in the ambulance, he started "shaking all over" per EMS, with movements being worse on the left, in conjunction with leftward eye deviation. He was administered 2.5 mg  IV Versed and the activity stopped. On arrival to the ED, the patient was awake with eyes open, staring straight forward, not answering questions.   1. CT head: No acute abnormality.  2. CTA head and neck: No LVO 3. MRI brain: No acute abnormality.  4. Has presented previously to [facility] this year with similar episodes, one in July and one in April. Results of testing have been negative and non-physiological etiology has been suspected. His Keppra was increased last visit and Zonegran was also started as false negative electrophysiological work up was also possible. However, given recurrence of atypical symptoms and signs with non-physiological exam findings and negative  imaging work up, if repeat electrophysiology this admission is negative, would consider discontinuing Zonegran, lowering Keppra and scheduling the patient for Digitrace monitoring as an outpatient.  5. Has allergies to Dilantin, Depakote and ASA. 6. 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    Recommendations: 1. EEG in AM 2. May need to be scheduled for an evaluation in the Epilepsy Monitoring Unit.  3. See HPI above for further details on his previous presentation in July.  4. The patient should be informed of the following: Per [facility] statutes, patients with seizures are not allowed to drive until  they have been seizure-free for six months. Use caution when using heavy equipment or power tools. Avoid working on ladders or at heights. Take showers instead of baths. Ensure the water temperature is not too high on the home water heater. Do not go swimming alone. When caring for infants or small children, sit down when holding, feeding, or changing them to minimize risk of injury to the child in the event you have a seizure. Also, Maintain good sleep hygiene. Avoid alcohol. 5. Continue current Keppra and Zonegran doses for now.   Electronically  signed: Dr. Caryl Pina 07/21/2020, 10:11 PM

## 2020-07-21 NOTE — ED Provider Notes (Signed)
[name] EMERGENCY DEPARTMENT Provider Note   CSN: [id number] Arrival date & time: 07/21/20  2114  An emergency department physician performed an initial assessment on this suspected stroke patient at 2114.  History No chief complaint on file.   KVON MCILHENNY is a 59 y.o. male.  The history is provided by the patient.  Neurologic Problem This is a recurrent problem. The current episode started 1 to 2 hours ago. The problem occurs rarely. The problem has not changed since onset.Pertinent negatives include no chest pain, no abdominal pain, no headaches and no shortness of breath. Nothing aggravates the symptoms. Nothing relieves the symptoms. He has tried nothing for the symptoms. The treatment provided no relief.       Past Medical History:  Diagnosis Date  . Asthma   . Bipolar 1 disorder (HCC)   . Borderline glaucoma   . Chronic pain   . Epididymal pain    LEFT  . 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 Feeling of incomplete bladder emptying   . Frequency of urination   . Gastric ulcer   . GERD (gastroesophageal reflux disease)   . Hypertension   . Hyperthyroidism    NO MEDS   . Migraine   . Seizures (HCC)   . TIA (transient ischemic attack)   . Type 2 diabetes mellitus [facility])     Patient Active Problem List   Diagnosis Date Noted  . Seizure (HCC) 07/22/2020  . Left-sided weakness 07/04/2020  . Dysarthria 07/04/2020  . Essential hypertension   . Seizure disorder (HCC) 07/02/2017  . Insulin-requiring or dependent type II diabetes mellitus (HCC) 07/02/2017  . CKD (chronic kidney disease), stage III 07/02/2017  . Normocytic anemia 07/02/2017  . Testicular/scrotal pain 11/09/2013  . Microhematuria 11/09/2013  . Condyloma acuminatum of scrotum 11/09/2013    Past Surgical History:  Procedure Laterality Date  . ABDOMINAL SURGERY    . ANTERIOR  CERVICAL DECOMP/DISCECTOMY FUSION  2007   C4  --  C6  . CERVICAL FUSION    . CHOLECYSTECTOMY    . CYSTOSCOPY N/A 11/09/2013   Procedure: CYSTOSCOPY FLEXIBLE;  Surgeon: Bjorn Pippin, MD;  Location: [facility];  Service: Urology;  Laterality: N/A;  . EPIDIDYMECTOMY Left 11/09/2013   Procedure:  LEFT EPIDIDYMECTOMY;  Surgeon: Bjorn Pippin, MD;  Location: [facility];  Service: Urology;  Laterality: Left;  POSSIBLE OUTPATIENT WITH OBSERVATION  . EXCISION LIPOMA LEFT SHOULDER  2004 (APPROX)  . MULTIPLE CYST REMOVED FROM CHEST  AGE 62  . OTHER SURGICAL HISTORY     hemorroid surgery   . TESTICLE REMOVAL Left   . TONSILLECTOMY         Family History  Problem Relation Age of Onset  . Heart failure Mother   . Diabetes Mother   . Hypertension Mother   . Cirrhosis Father   . Heart failure Father   . Kidney disease Father     Social History   Tobacco Use  . Smoking status: Former Smoker    Packs/day: 0.25    Years: 15.00    Pack years: 3.75    Types: Cigarettes    Quit date: 11/08/1992    Years since quitting: 27.7  . Smokeless tobacco: Never Used  Vaping Use  . Vaping Use: Never used  Substance Use Topics  . Alcohol use: No  .  Drug use: No    Home Medications Prior to Admission medications   Medication Sig Start Date End Date Taking? Authorizing Provider  acetaminophen (TYLENOL 8 HOUR) 650 MG CR tablet Take 1 tablet (650 mg total) by mouth every 8 (eight) hours. 05/09/19  Yes Derwood Kaplan, MD  albuterol (PROVENTIL HFA;VENTOLIN HFA) 108 (90 Base) MCG/ACT inhaler Inhale 1-2 puffs into the lungs every 6 (six) hours as needed for wheezing or shortness of breath. 11/15/18  Yes Rancour, Jeannett Senior, MD  atorvastatin (LIPITOR) 80 MG tablet Take 80 mg by mouth at bedtime.    Yes [provider]  budesonide-formoterol (SYMBICORT) 80-4.5 MCG/ACT inhaler Inhale 2 puffs into the lungs daily.   Yes [provider]  clopidogrel (PLAVIX) 75 MG tablet  Take 1 tablet (75 mg total) by mouth daily. 07/05/20  Yes Azucena Fallen, MD  ergocalciferol (VITAMIN D2) 1.25 MG (50000 UT) capsule Take 50,000 Units by mouth every Monday.   Yes [provider]  fenofibrate (TRICOR) 145 MG tablet Take 145 mg by mouth daily. 10/24/19  Yes [provider]  fluticasone-salmeterol (ADVAIR HFA) 115-21 MCG/ACT inhaler Inhale 2 puffs into the lungs daily.   Yes [provider]  gabapentin (NEURONTIN) 300 MG capsule Take 2 capsules (600 mg total) by mouth 2 (two) times daily. 07/06/20 08/05/20 Yes Azucena Fallen, MD  HUMALOG KWIKPEN 200 UNIT/ML KwikPen Inject 0-10 Units into the skin 3 (three) times daily with meals. 07/05/20  Yes Azucena Fallen, MD  latanoprost (XALATAN) 0.005 % ophthalmic solution Place 1 drop into both eyes at bedtime.  07/04/20  Yes [provider]  levETIRAcetam (KEPPRA) 1000 MG tablet Take 1 tablet (1,000 mg total) by mouth 2 (two) times daily. 07/05/20  Yes Azucena Fallen, MD  metFORMIN (GLUCOPHAGE) 1000 MG tablet Take 1 tablet (1,000 mg total) by mouth 2 (two) times daily. 07/05/20  Yes Azucena Fallen, MD  nortriptyline (PAMELOR) 50 MG capsule Take 100 mg by mouth at bedtime.    Yes [provider]  pantoprazole (PROTONIX) 40 MG tablet Take 40 mg by mouth 2 (two) times daily. 07/03/20  Yes [provider]  polyethylene glycol powder (GLYCOLAX/MIRALAX) 17 GM/SCOOP powder Take 17 g by mouth daily as needed for mild constipation. 12/31/15  Yes [provider]  rizatriptan (MAXALT) 10 MG tablet Take 10 mg by mouth daily. May repeat in 2 hours if needed    Yes [provider]  sertraline (ZOLOFT) 50 MG tablet Take 50 mg by mouth daily. 06/13/20  Yes [provider]  timolol (TIMOPTIC) 0.5 % ophthalmic solution Place 1 drop into both eyes 2 (two) times daily.  04/06/20  Yes [provider]  valsartan (DIOVAN) 320 MG tablet Take 320 mg by mouth daily.  07/04/20  Yes [provider]  zonisamide (ZONEGRAN) 100 MG capsule Take 200 mg by mouth 2 (two) times daily.  05/17/20  Yes [provider]    Allergies    Levothyroxine, Nsaids, Amoxicillin, Ampicillin, Penicillins, Strawberry extract, Asa [aspirin], Bactrim [sulfamethoxazole-trimethoprim], Depakote [divalproex sodium], Dilantin [phenytoin], Methocarbamol, Risperidone and related, Sitagliptin, Strawberry (diagnostic), Sulfa antibiotics, Tolmetin, Ultram [tramadol hcl], and Oatmeal  Review of Systems   Review of Systems  Unable to perform ROS: Mental status change  Respiratory: Negative for shortness of breath.   Cardiovascular: Negative for chest pain.  Gastrointestinal: Negative for abdominal pain.  Neurological: Negative for headaches.    Physical Exam Updated Vital Signs BP 113/78   Pulse 65   Temp  98 F (36.7 C) (Oral)   Resp 16   Ht 5\' 7"  (1.702 m)   Wt 86 kg   SpO2 99%   BMI 29.69 kg/m   Physical Exam Vitals and nursing note reviewed.  Constitutional:      Appearance: He is well-developed.     Comments: S;urred speech and lethargic on exam.    HENT:     Head: Normocephalic and atraumatic.  Eyes:     Conjunctiva/sclera: Conjunctivae normal.  Cardiovascular:     Rate and Rhythm: Normal rate and regular rhythm.     Heart sounds: No murmur heard.   Pulmonary:     Effort: Pulmonary effort is normal. No respiratory distress.     Breath sounds: Normal breath sounds.  Abdominal:     Palpations: Abdomen is soft.     Tenderness: There is no abdominal tenderness. There is no guarding or rebound.  Musculoskeletal:     Cervical back: Neck supple. No rigidity.  Skin:    General: Skin is warm and dry.     Capillary Refill: Capillary refill takes less than 2 seconds.  Neurological:     Mental Status: He is alert.     Sensory: Sensory deficit present.     Motor: Weakness present.     Comments: 0/5 strength in the RUE and LLE.  Slurred speech on exam.   Mild R facial droop.  5/5 strength on the L side.       ED Results / Procedures / Treatments   Labs (all labs ordered are listed, but only abnormal results are displayed) Labs Reviewed  CBC - Abnormal; Notable for the following components:      Result Value   RBC 4.03 (*)    Hemoglobin 11.0 (*)    HCT 35.0 (*)    RDW 16.3 (*)    All other components within normal limits  COMPREHENSIVE METABOLIC PANEL - Abnormal; Notable for the following components:   Creatinine, Ser 1.30 (*)    All other components within normal limits  I-STAT CHEM 8, ED - Abnormal; Notable for the following components:   TCO2 20 (*)    Hemoglobin 11.9 (*)    HCT 35.0 (*)    All other components within normal limits  SARS CORONAVIRUS 2 BY RT PCR (HOSPITAL ORDER, PERFORMED IN [place] [facility])  PROTIME-INR  APTT  DIFFERENTIAL  CBG MONITORING, ED    EKG None  Radiology CT HEAD CODE STROKE WO CONTRAST  Result Date: 07/21/2020 CLINICAL DATA:  Code stroke. Initial evaluation for acute right-sided weakness, slurred speech. EXAM: CT HEAD WITHOUT CONTRAST TECHNIQUE: Contiguous axial images were obtained from the base of the skull through the vertex without intravenous contrast. COMPARISON:  Prior study from 07/04/2020. FINDINGS: Brain: Cerebral volume within normal limits for patient age. No evidence for acute intracranial hemorrhage. No findings to suggest acute large vessel territory infarct. No mass lesion, midline shift, or mass effect. Ventricles are normal in size without evidence for hydrocephalus. No extra-axial fluid collection identified. Vascular: No hyperdense vessel identified. Skull: Scalp soft tissues demonstrate no acute abnormality. Calvarium intact. Sinuses/Orbits: Globes and orbital soft tissues within normal limits. Visualized paranasal sinuses are clear. No mastoid effusion. ASPECTS [facility]([place] Stroke Program Early CT Score) - Ganglionic level infarction (caudate, lentiform nuclei, internal  capsule, insula, M1-M3 cortex): 7 - Supraganglionic infarction (M4-M6 cortex): 3 Total score (0-10 with 10 being normal): 10 IMPRESSION: 1. No acute intracranial infarct or other abnormality. 2. ASPECTS is 10. These results  were communicated to Dr. Otelia Limes at 9:37 pmon 8/15/2021by text page via the [facility] messaging system. Electronically Signed   By: Rise Mu M.D.   On: 07/21/2020 21:39   CT ANGIO HEAD CODE STROKE  Result Date: 07/21/2020 CLINICAL DATA:  Initial evaluation for acute stroke, speech difficulty, right-sided weakness. EXAM: CT ANGIOGRAPHY HEAD AND NECK TECHNIQUE: Multidetector CT imaging of the head and neck was performed using the standard protocol during bolus administration of intravenous contrast. Multiplanar CT image reconstructions and MIPs were obtained to evaluate the vascular anatomy. Carotid stenosis measurements (when applicable) are obtained utilizing NASCET criteria, using the distal internal carotid diameter as the denominator. CONTRAST:  OMNIPAQUE IOHEXOL 350 MG/ML SOLN COMPARISON:  Prior CTA from 07/04/2020. FINDINGS: CTA NECK FINDINGS Aortic arch: Visualized aortic arch of normal caliber with normal branch pattern. No flow-limiting stenosis seen about the origin of the great vessels. Right carotid system: Right common and internal carotid arteries widely patent without stenosis, dissection or occlusion. Left carotid system: Left common and internal carotid arteries widely patent without stenosis, dissection or occlusion. Vertebral arteries: Right vertebral artery dominant and arises from the right subclavian artery. Left vertebral artery diffusely hypoplastic and arises directly from the aortic arch. Visualized vertebral arteries widely patent without stenosis, dissection or occlusion. Evaluation of the V2 segments mildly limited by streak artifact from cervical fusion hardware. Skeleton: No acute osseous abnormality. No discrete or worrisome osseous lesions. Prior  fusion at C4 through C7. Trace grade 1 anterolisthesis of C7 on T1, chronic and facet mediated. Other neck: No other acute soft tissue abnormality within the neck. No mass lesion or adenopathy. Upper chest: Scattered atelectatic changes noted within the visualized lungs. Partially visualized upper chest demonstrates no other acute finding. Review of the MIP images confirms the above findings CTA HEAD FINDINGS Anterior circulation: Internal carotid arteries widely patent to the termini without stenosis. A1 segments widely patent. Normal anterior communicating artery complex. Anterior cerebral arteries patent to their distal aspects without stenosis. No M1 stenosis or occlusion. Normal MCA bifurcations. Distal MCA branches well perfused and symmetric. Posterior circulation: Vertebral arteries widely patent to the vertebrobasilar junction without stenosis. Left PICA patent. Right PICA not seen. Basilar widely patent to its distal aspect without stenosis. Superior cerebral arteries patent bilaterally. Both PCAs primarily supplied via the basilar and are well perfused to their distal aspects. Prominent right posterior communicating artery noted. Venous sinuses: Patent. Anatomic variants: None significant.  No intracranial aneurysm. Review of the MIP images confirms the above findings IMPRESSION: Negative CTA of the head and neck for large vessel occlusion. No hemodynamically significant stenosis or other acute vascular abnormality. No aneurysm. These results were communicated to Dr. Otelia Limes at 10:08 pmon 8/15/2021by text page via the [facility] messaging system. Electronically Signed   By: Rise Mu M.D.   On: 07/21/2020 22:11   CT ANGIO NECK CODE STROKE  Result Date: 07/21/2020 CLINICAL DATA:  Initial evaluation for acute stroke, speech difficulty, right-sided weakness. EXAM: CT ANGIOGRAPHY HEAD AND NECK TECHNIQUE: Multidetector CT imaging of the head and neck was performed using the standard protocol during  bolus administration of intravenous contrast. Multiplanar CT image reconstructions and MIPs were obtained to evaluate the vascular anatomy. Carotid stenosis measurements (when applicable) are obtained utilizing NASCET criteria, using the distal internal carotid diameter as the denominator. CONTRAST:  OMNIPAQUE IOHEXOL 350 MG/ML SOLN COMPARISON:  Prior CTA from 07/04/2020. FINDINGS: CTA NECK FINDINGS Aortic arch: Visualized aortic arch of normal caliber with normal branch pattern. No  flow-limiting stenosis seen about the origin of the great vessels. Right carotid system: Right common and internal carotid arteries widely patent without stenosis, dissection or occlusion. Left carotid system: Left common and internal carotid arteries widely patent without stenosis, dissection or occlusion. Vertebral arteries: Right vertebral artery dominant and arises from the right subclavian artery. Left vertebral artery diffusely hypoplastic and arises directly from the aortic arch. Visualized vertebral arteries widely patent without stenosis, dissection or occlusion. Evaluation of the V2 segments mildly limited by streak artifact from cervical fusion hardware. Skeleton: No acute osseous abnormality. No discrete or worrisome osseous lesions. Prior fusion at C4 through C7. Trace grade 1 anterolisthesis of C7 on T1, chronic and facet mediated. Other neck: No other acute soft tissue abnormality within the neck. No mass lesion or adenopathy. Upper chest: Scattered atelectatic changes noted within the visualized lungs. Partially visualized upper chest demonstrates no other acute finding. Review of the MIP images confirms the above findings CTA HEAD FINDINGS Anterior circulation: Internal carotid arteries widely patent to the termini without stenosis. A1 segments widely patent. Normal anterior communicating artery complex. Anterior cerebral arteries patent to their distal aspects without stenosis. No M1 stenosis or occlusion. Normal  MCA bifurcations. Distal MCA branches well perfused and symmetric. Posterior circulation: Vertebral arteries widely patent to the vertebrobasilar junction without stenosis. Left PICA patent. Right PICA not seen. Basilar widely patent to its distal aspect without stenosis. Superior cerebral arteries patent bilaterally. Both PCAs primarily supplied via the basilar and are well perfused to their distal aspects. Prominent right posterior communicating artery noted. Venous sinuses: Patent. Anatomic variants: None significant.  No intracranial aneurysm. Review of the MIP images confirms the above findings IMPRESSION: Negative CTA of the head and neck for large vessel occlusion. No hemodynamically significant stenosis or other acute vascular abnormality. No aneurysm. These results were communicated to Dr. Otelia Limes at 10:08 pmon 8/15/2021by text page via the [facility] messaging system. Electronically Signed   By: Rise Mu M.D.   On: 07/21/2020 22:11    Procedures Procedures (including critical care time)  Medications Ordered in ED Medications  sodium chloride flush (NS) 0.9 % injection 3 mL (has no administration in time range)  iohexol (OMNIPAQUE) 350 MG/ML injection 100 mL (100 mLs Intravenous Contrast Given 07/21/20 2142)  levETIRAcetam (KEPPRA) 3,000 mg in sodium chloride 0.9 % 250 mL IVPB (0 mg Intravenous Stopped 07/21/20 2318)    ED Course  I have reviewed the triage vital signs and the nursing notes.  Pertinent labs & imaging results that were available during my care of the patient were reviewed by me and considered in my medical decision making (see chart for details).    MDM Rules/Calculators/A&P                          59 year old male with history of seizures and CVA presents as a code stroke with right-sided deficits.  Patient states that he has had a headache all day and had a last known normal at approximately 1950 this evening.  Patient endorsed to EMS that he had slurred speech  as well as right-sided weakness.  EMS reported a 45 second seizure that abated with 2.5 mg of Versed.  On arrival to the emerge department patient had decreased strength in the right upper and right lower extremity and had slurred speech on physical exam.  Neurology evaluated patient at bedside and patient was transported to CT scanner for CT imaging.  CT head as well  as CT angiogram negative for acute occlusion or infarct.  Patient had additional 45 seconds seizure while in the CT scanner that did not require any medication.  Given second seizure patient was loaded with approximately 3000 mg of Keppra.  Labs okay.  On repeat evaluation patient very somnolent bed and minimally cooperative w/ physical exam.  Spoke with neurology who stated the patient was well-known to their service and had work-ups in the past for seizures which did not have evidence of epileptiform activity on previous EEGs.  Recommended the patient be admitted to a medicine service as he was not a TPA candidate.  Spoke with hospitalist service regarding this patient who agreed with the admission of their service.  Patient was admitted to the hospital service in stable condition without further events.  Final Clinical Impression(s) / ED Diagnoses Final diagnoses:  Stroke-like symptoms    Rx / DC Orders ED Discharge Orders    None       Rickey Primus, MD 07/22/20 1610    Alvira Monday, MD 07/23/20 0105

## 2020-07-21 NOTE — ED Triage Notes (Signed)
Pt arrived approx 2100   Specific times already charted the pt was lsn 1950  Pt having a headache all day   Ems witnessed a seizure   And they gave him 5mg  of versed iv  History of  Seizures  He was seen 2 days ago at [facility] long ed for seizure c/o rt sided weakness

## 2020-07-21 NOTE — ED Notes (Signed)
Limited attempt to follow commands and answer questions unabled to get an acurate neuro chesk

## 2020-07-21 NOTE — ED Notes (Signed)
The pt returned from c-t and almost had a tonic clonie episod that lasted approx 45 secs   He almost immedaitely had direct vision contact with staff and he attempted to answer questions asked.  No tongue damage and no bowel or bladder incontinence

## 2020-07-21 NOTE — Code Documentation (Signed)
Stroke Response Nurse Documentation Code Documentation  Nicholas [name] is a 59 y.o. male arriving to [facility] ED via [facility] on 8/15 with past medical hx of HTN. DM II, Bipolar, Seizures and Covid infection 2020. Code stroke was activated by EMS. Patient from his Doctor's office where he was LKW at 1950 and now complaining of Right weakness. On clopidogrel 75 mg daily. Stroke team at the bedside on patient arrival. Labs drawn and patient cleared for CT by Dr. Foy Gu[place]ara. Patient to CT with team. NIHSS 15, see documentation for details and code stroke times. Patient with decreased LOC, right hemianopia, right facial droop, right arm weakness, right decreased sensation and dysarthria  on exam. The following imaging was completed:  CTA. Patient is not a candidate for tPA due to findings consistent with non-organic process. Bedside handoff with ED RN Thayer Ohm.    Rose Fillers  Rapid Response RN

## 2020-07-21 NOTE — ED Notes (Signed)
The pt will not pass a swallow screen  Now if I tested  Him he will not follow instructions

## 2020-07-21 NOTE — ED Notes (Signed)
The pt has barely been awake since he returned from c-t

## 2020-07-22 ENCOUNTER — Observation Stay (HOSPITAL_COMMUNITY): Payer: Medicare Other

## 2020-07-22 ENCOUNTER — Encounter (HOSPITAL_COMMUNITY): Payer: Self-pay | Admitting: Internal Medicine

## 2020-07-22 DIAGNOSIS — I10 Essential (primary) hypertension: Secondary | ICD-10-CM

## 2020-07-22 DIAGNOSIS — N1832 Chronic kidney disease, stage 3b: Secondary | ICD-10-CM

## 2020-07-22 DIAGNOSIS — R569 Unspecified convulsions: Secondary | ICD-10-CM | POA: Diagnosis not present

## 2020-07-22 DIAGNOSIS — Z794 Long term (current) use of insulin: Secondary | ICD-10-CM

## 2020-07-22 DIAGNOSIS — E119 Type 2 diabetes mellitus without complications: Secondary | ICD-10-CM

## 2020-07-22 LAB — GLUCOSE, CAPILLARY
Glucose-Capillary: 185 mg/dL — ABNORMAL HIGH (ref 70–99)
Glucose-Capillary: 200 mg/dL — ABNORMAL HIGH (ref 70–99)

## 2020-07-22 LAB — BASIC METABOLIC PANEL
Anion gap: 10 (ref 5–15)
BUN: 15 mg/dL (ref 6–20)
CO2: 21 mmol/L — ABNORMAL LOW (ref 22–32)
Calcium: 9.2 mg/dL (ref 8.9–10.3)
Chloride: 110 mmol/L (ref 98–111)
Creatinine, Ser: 1.15 mg/dL (ref 0.61–1.24)
GFR calc Af Amer: >60 mL/min (ref >60)
GFR calc non Af Amer: >60 mL/min (ref >60)
Glucose, Bld: 97 mg/dL (ref 70–99)
Potassium: 4 mmol/L (ref 3.5–5.1)
Sodium: 141 mmol/L (ref 135–145)

## 2020-07-22 LAB — CBC
HCT: 36 % — ABNORMAL LOW (ref 39.0–52.0)
Hemoglobin: 11.4 g/dL — ABNORMAL LOW (ref 13.0–17.0)
MCH: 27.2 pg (ref 26.0–34.0)
MCHC: 31.7 g/dL (ref 30.0–36.0)
MCV: 85.9 fL (ref 80.0–100.0)
Platelets: 152 10*3/uL (ref 150–400)
RBC: 4.19 MIL/uL — ABNORMAL LOW (ref 4.22–5.81)
RDW: 16.5 % — ABNORMAL HIGH (ref 11.5–15.5)
WBC: 5 10*3/uL (ref 4.0–10.5)
nRBC: 0 % (ref 0.0–0.2)

## 2020-07-22 LAB — CBG MONITORING, ED
Glucose-Capillary: 84 mg/dL (ref 70–99)
Glucose-Capillary: 117 mg/dL — ABNORMAL HIGH (ref 70–99)

## 2020-07-22 MED ORDER — FENOFIBRATE 160 MG PO TABS
160.0000 mg | ORAL_TABLET | Freq: Every day | ORAL | Status: DC
  Administered 2020-07-22 – 2020-07-25 (×4): 160 mg via ORAL
  Filled 2020-07-22 (×4): qty 1

## 2020-07-22 MED ORDER — HYDROCODONE-ACETAMINOPHEN 5-325 MG PO TABS
1.0000 | ORAL_TABLET | ORAL | Status: DC | PRN
  Administered 2020-07-22 – 2020-07-25 (×4): 1 via ORAL
  Filled 2020-07-22 (×4): qty 1

## 2020-07-22 MED ORDER — LATANOPROST 0.005 % OP SOLN
1.0000 [drp] | Freq: Every day | OPHTHALMIC | Status: DC
  Administered 2020-07-22 – 2020-07-24 (×3): 1 [drp] via OPHTHALMIC
  Filled 2020-07-22 (×2): qty 2.5

## 2020-07-22 MED ORDER — LEVETIRACETAM 500 MG PO TABS
1000.0000 mg | ORAL_TABLET | Freq: Two times a day (BID) | ORAL | Status: DC
  Administered 2020-07-22: 1000 mg via ORAL
  Filled 2020-07-22: qty 2

## 2020-07-22 MED ORDER — IRBESARTAN 300 MG PO TABS
300.0000 mg | ORAL_TABLET | Freq: Every day | ORAL | Status: DC
  Administered 2020-07-22 – 2020-07-25 (×4): 300 mg via ORAL
  Filled 2020-07-22 (×4): qty 1

## 2020-07-22 MED ORDER — MOMETASONE FURO-FORMOTEROL FUM 100-5 MCG/ACT IN AERO
2.0000 | INHALATION_SPRAY | Freq: Two times a day (BID) | RESPIRATORY_TRACT | Status: DC
  Administered 2020-07-22 – 2020-07-25 (×7): 2 via RESPIRATORY_TRACT
  Filled 2020-07-22 (×2): qty 8.8

## 2020-07-22 MED ORDER — SERTRALINE HCL 50 MG PO TABS
50.0000 mg | ORAL_TABLET | Freq: Every day | ORAL | Status: DC
  Administered 2020-07-22 – 2020-07-25 (×4): 50 mg via ORAL
  Filled 2020-07-22 (×4): qty 1

## 2020-07-22 MED ORDER — CLOPIDOGREL BISULFATE 75 MG PO TABS
75.0000 mg | ORAL_TABLET | Freq: Every day | ORAL | Status: DC
  Administered 2020-07-22 – 2020-07-25 (×4): 75 mg via ORAL
  Filled 2020-07-22 (×4): qty 1

## 2020-07-22 MED ORDER — ENOXAPARIN SODIUM 40 MG/0.4ML ~~LOC~~ SOLN
40.0000 mg | Freq: Every day | SUBCUTANEOUS | Status: DC
  Administered 2020-07-22 – 2020-07-25 (×4): 40 mg via SUBCUTANEOUS
  Filled 2020-07-22 (×4): qty 0.4

## 2020-07-22 MED ORDER — NORTRIPTYLINE HCL 25 MG PO CAPS
100.0000 mg | ORAL_CAPSULE | Freq: Every day | ORAL | Status: DC
  Administered 2020-07-23 – 2020-07-24 (×2): 100 mg via ORAL
  Filled 2020-07-22 (×5): qty 4

## 2020-07-22 MED ORDER — ATORVASTATIN CALCIUM 80 MG PO TABS
80.0000 mg | ORAL_TABLET | Freq: Every day | ORAL | Status: DC
  Administered 2020-07-22 – 2020-07-24 (×3): 80 mg via ORAL
  Filled 2020-07-22 (×3): qty 1

## 2020-07-22 MED ORDER — TIMOLOL MALEATE 0.5 % OP SOLN
1.0000 [drp] | Freq: Two times a day (BID) | OPHTHALMIC | Status: DC
  Administered 2020-07-22 – 2020-07-25 (×7): 1 [drp] via OPHTHALMIC
  Filled 2020-07-22: qty 5

## 2020-07-22 MED ORDER — ONDANSETRON HCL 4 MG PO TABS: 4.0000 mg | ORAL_TABLET | Freq: Four times a day (QID) | ORAL | Status: DC | PRN

## 2020-07-22 MED ORDER — GABAPENTIN 300 MG PO CAPS
600.0000 mg | ORAL_CAPSULE | Freq: Two times a day (BID) | ORAL | Status: DC
  Administered 2020-07-22 – 2020-07-25 (×7): 600 mg via ORAL
  Filled 2020-07-22 (×7): qty 2

## 2020-07-22 MED ORDER — INSULIN ASPART 100 UNIT/ML ~~LOC~~ SOLN
0.0000 [IU] | Freq: Three times a day (TID) | SUBCUTANEOUS | Status: DC
  Administered 2020-07-22 – 2020-07-23 (×2): 2 [IU] via SUBCUTANEOUS
  Administered 2020-07-23 (×2): 1 [IU] via SUBCUTANEOUS
  Administered 2020-07-24 (×2): 2 [IU] via SUBCUTANEOUS
  Administered 2020-07-24 – 2020-07-25 (×3): 1 [IU] via SUBCUTANEOUS

## 2020-07-22 MED ORDER — ZONISAMIDE 100 MG PO CAPS
200.0000 mg | ORAL_CAPSULE | Freq: Two times a day (BID) | ORAL | Status: DC
  Administered 2020-07-22: 200 mg via ORAL
  Filled 2020-07-22 (×2): qty 2

## 2020-07-22 MED ORDER — ONDANSETRON HCL 4 MG/2ML IJ SOLN: 4.0000 mg | Freq: Four times a day (QID) | INTRAMUSCULAR | Status: DC | PRN

## 2020-07-22 MED ORDER — PANTOPRAZOLE SODIUM 40 MG PO TBEC
40.0000 mg | DELAYED_RELEASE_TABLET | Freq: Two times a day (BID) | ORAL | Status: DC
  Administered 2020-07-22 – 2020-07-25 (×7): 40 mg via ORAL
  Filled 2020-07-22 (×7): qty 1

## 2020-07-22 MED ORDER — ALBUTEROL SULFATE (2.5 MG/3ML) 0.083% IN NEBU: 2.5000 mg | INHALATION_SOLUTION | Freq: Four times a day (QID) | RESPIRATORY_TRACT | Status: DC | PRN

## 2020-07-22 MED ORDER — ACETAMINOPHEN 325 MG PO TABS
650.0000 mg | ORAL_TABLET | Freq: Four times a day (QID) | ORAL | Status: DC | PRN
  Administered 2020-07-22: 650 mg via ORAL
  Filled 2020-07-22: qty 2

## 2020-07-22 NOTE — Progress Notes (Addendum)
Subjective: Captured 2 events during routine EEG which were clearly nonepileptic.  Discussed this with patient.  However he states he has had seizures since he was 59 years old.  Unable to describe seizure semiology at this point but states when he was 68 years old his mom's had reported that his eyes were rolled back and he would bite his tongue with seizures.  States yesterday he had a headache, vision disturbance, right-sided numbness and feeling dizzy before this possible seizure-like episode.  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  Was very upset about his lunch.  ROS: negative except above  Examination  Vital signs in last 24 hours: Temp:  [98 F (36.7 C)] 98 F (36.7 C) (08/15 23-Mar-2154) Pulse Rate:  [65-76] 69 (08/16 1255) Resp:  [15-23] 18 (08/16 1255) BP: (105-126)/(78-82) 108/82 (08/16 0600) SpO2:  [91 %-100 %] 97 % (08/16 1255) Weight:  [86 kg] 86 kg (08/15 2316)  General: lying in bed, not in apparent distress CVS: pulse-normal rate and rhythm RS: breathing comfortably, CTA B Extremities: normal, warm Neuro: AOx3, cranial nerves II to XII grossly intact, 2/5 in right upper extremity however strongly suggestive of lack of effort from patient, antigravity strength in all other extremities  Basic Metabolic Panel: Recent Labs  Lab 07/18/20 1844 07/21/20 2123/03/24 07/21/20 Mar 23, 2132 07/22/20 0754  NA 139 140 143 141  K 4.7 4.1 4.0 4.0  CL 109 109 111 110  CO2 22 22  --  21*  GLUCOSE 133* 88 83 97  BUN 14 18 20 15   CREATININE 1.33* 1.30* 1.20 1.15  CALCIUM 9.2 9.6  --  9.2    CBC: Recent Labs  Lab 07/18/20 1844 07/21/20 03-24-23 07/21/20 03/23/2132 07/22/20 0754  WBC 5.9 5.9  --  5.0  NEUTROABS  --  3.5  --   --   HGB 11.8* 11.0* 11.9* 11.4*  HCT 37.7* 35.0* 35.0* 36.0*  MCV 87.9 86.8  --  85.9  PLT 171 151  --  152     Coagulation Studies: Recent Labs    07/21/20 03/24/2123  LABPROT 14.2   INR 1.1    Imaging MRI brain without contrast 07/22/2020: No acute abnormality CTA head and neck 07/21/2020: No significant occlusion   ASSESSMENT AND PLAN: 59 year old male with history of chronic headache and seizure-like episodes who presented with further seizure-like episode.  Chronic headache Seizure-like episodes Anemia -Routine EEG captured 2 events during which patient was noted of left side upper extremity jerking and followed by whole body twitching, not responding to questions.  No EEG change was seen during these events and these were nonepileptic.  This diagnosis was discussed with patient.  However patient would like longer monitoring and is not willing to wait for EMU admission  Recommendations - We will admit patient for LTM EEG to assess for potential epileptogenicity - We will discontinue antiepileptics (Keppra and zonisamide)  - Encourage sleep deprivation - Seizure precautions - Management of rest of home meds per primary team   I have spent a total of 50   minutes with the patient reviewing hospital notes,  test results, labs and examining the patient as well as establishing an assessment and plan that was discussed personally with the patient.  > 50% of time was spent in direct patient care.     [facility] Ep[facility] For questions after 5pm please refer to  AMION to reach the Neurologist on call

## 2020-07-22 NOTE — Progress Notes (Signed)
EEG complete - results pending 

## 2020-07-22 NOTE — ED Notes (Signed)
Ordered breakfast 

## 2020-07-22 NOTE — Progress Notes (Addendum)
PROGRESS NOTE    Nicholas [name]  ZOX:[id number] DOB: 1961-05-22 DOA: 07/21/2020 PCP: Patient, No Pcp Per    Brief Narrative:  Patient was admitted to the hospital with the working diagnosis of altered mental status to rule out seizures.  59 year old male with a significant past medical history for seizures, asthma, hypertension, type 2 diabetes mellitus, and dyslipidemia.  Patient reported right-sided weakness, unclear about timing.  Because of persistent symptoms he called EMS and he was brought to the hospital, on route he was witnessed to have generalized tonic-clonic seizure for about 45 seconds, which was treated with 2.5 mg of intravenous midazolam.  Patient was reevaluated in the ED, well undergoing head CT scan he developed generalized tonic-clonic seizure, lasting for about 45 seconds, he received a loading dose of Keppra.   On his initial physical examination blood pressure 114/79, heart rate 69, respirate 15, oxygen saturation 99%.  He was awake and alert, his lungs are clear to auscultation bilaterally, heart S1-S2, present rhythmic, soft abdomen, no extremity edema.  Positive antiepileptic agent he was somnolent.   Sodium 140, potassium 4.1, chloride 109, bicarb 22, glucose 88, BUN 18, creatinine 1.30, white count 5.9, hemoglobin 11.0, hematocrit 35.0, platelets 151.  SARS COVID-19 negative. EKG 68 bpm, left axis deviation, left anterior fascicular block, normal intervals, manually corrected QTC 387, sinus rhythm, poor R wave progression, no ST segment or T wave changes.  Head CT no acute changes. Head and neck CT angiography with no large vessel occlusion.  No hemodynamic significant stenosis or other acute vascular abnormality, no aneurysm. Brain MRI with no acute changes.   Assessment & Plan:   Principal Problem:   Seizure (HCC) Active Problems:   Insulin-requiring or dependent type II diabetes mellitus (HCC)   CKD (chronic kidney disease), stage III   Essential  hypertension   1. Altered mental status to rule out seizures. Patient had to generalized episodes over last 24 H, his initial EEG with no epileptic activity. Work up is negative for acute CVA.   Case discussed with Dr. Melynda Ripple, patient will need further electroencephalographic studies to rule out uncontrolled epileptic activity.   Plan to hold on Keppra for now and continue inpatient monitoring.   2. T2DM/ dyslipidemia. Continue glucose cover and monitoring with insulin sliding scale. Patient is tolerating po well.   Continue with statin therapy with atorvastatin 80 mg daily and fenofibrate. Patient has been on clopidogrel.   3. HTN. Continue blood pressure control with irbesartan.   4. Anemia chronic with stable Hgb at 11.4 and Hct at 36.0.   5. CKD stage 2. Base cr around 1,2. Today renal function with serum cr at 1,5, with K at 4,0 and serum bicarbonate at 21.   6. Depression. Patient had recent family loss, within one year, has been under increased psychological stress at home.  Continue with setrraline and nortriptyline  7. Back pain. Persistent back pain, refractive to acetaminophen. Will add as needed hydrocodone, patient with decraesed GFR, will avoid non steroidal anti-inflammatory agents.   Continue with gabapentin.   8. Asthma. No signs of acute exacerbation, continue with as needed albuterol and bid dulera.   Will continue close follow up on renal function and electrolytes.    Patient continue to be at high risk for recurrent seizures.   Status is: Observation   Patient continue to be at risk for recurrent seizure activity, continue observation for now, pending further neurology recommendations  Dispo: The patient is from: Home  Anticipated d/c is to: Home              Anticipated d/c date is: 2 days              Patient currently is not medically stable to d/c.   DVT prophylaxis: Enoxaparin   Code Status:    full  Family Communication:  No family  at the bedside     Consultants:   Neurology     Subjective: Patient very concerned about recurrent seizures, no nausea or vomiting, no dyspnea or chest pain. Continue to have significant back pain, moderate to sever in intensity, worse with movement, no improving factors, no radiation.   Objective: Vitals:   07/22/20 1240 07/22/20 1245 07/22/20 1250 07/22/20 1255  BP:      Pulse: 68 72 71 69  Resp: 18 (!) 23 16 18   Temp:      TempSrc:      SpO2: 94% 96% 98% 97%  Weight:      Height:       No intake or output data in the 24 hours ending 07/22/20 1322 Filed Weights   07/21/20 2316  Weight: 86 kg    Examination:   General: Not in pain or dyspnea, deconditioned  Neurology: Awake and alert, non focal  E ENT: mild pallor, no icterus, oral mucosa moist Cardiovascular: No JVD. S1-S2 present, rhythmic, no gallops, rubs, or murmurs. No lower extremity edema. Pulmonary: positive breath sounds bilaterally, adequate air movement, no wheezing, rhonchi or rales. Gastrointestinal. Abdomen soft and non tender Skin. No rashes Musculoskeletal: no joint deformities     Data Reviewed: I have personally reviewed following labs and imaging studies  CBC: Recent Labs  Lab 07/18/20 1844 07/21/20 2124 07/21/20 2133 07/22/20 0754  WBC 5.9 5.9  --  5.0  NEUTROABS  --  3.5  --   --   HGB 11.8* 11.0* 11.9* 11.4*  HCT 37.7* 35.0* 35.0* 36.0*  MCV 87.9 86.8  --  85.9  PLT 171 151  --  152   Basic Metabolic Panel: Recent Labs  Lab 07/18/20 1844 07/21/20 2124 07/21/20 2133 07/22/20 0754  NA 139 140 143 141  K 4.7 4.1 4.0 4.0  CL 109 109 111 110  CO2 22 22  --  21*  GLUCOSE 133* 88 83 97  BUN 14 18 20 15   CREATININE 1.33* 1.30* 1.20 1.15  CALCIUM 9.2 9.6  --  9.2   GFR: Estimated Creatinine Clearance: 73.4 mL/min (by C-G formula based on SCr of 1.15 mg/dL). Liver Function Tests: Recent Labs  Lab 07/21/20 2124  AST 23  ALT 30  ALKPHOS 54  BILITOT 0.3  PROT 6.6    ALBUMIN 4.0   No results for input(s): LIPASE, AMYLASE in the last 168 hours. No results for input(s): AMMONIA in the last 168 hours. Coagulation Profile: Recent Labs  Lab 07/21/20 2124  INR 1.1   Cardiac Enzymes: No results for input(s): CKTOTAL, CKMB, CKMBINDEX, TROPONINI in the last 168 hours. BNP (last 3 results) No results for input(s): PROBNP in the last 8760 hours. HbA1C: No results for input(s): HGBA1C in the last 72 hours. CBG: Recent Labs  Lab 07/21/20 2116 07/22/20 0645  GLUCAP 85 84   Lipid Profile: No results for input(s): CHOL, HDL, LDLCALC, TRIG, CHOLHDL, LDLDIRECT in the last 72 hours. Thyroid Function Tests: No results for input(s): TSH, T4TOTAL, FREET4, T3FREE, THYROIDAB in the last 72 hours. Anemia Panel: No results for input(s): VITAMINB12, FOLATE, FERRITIN, TIBC,  IRON, RETICCTPCT in the last 72 hours.    Radiology Studies: I have reviewed all of the imaging during this hospital visit personally     Scheduled Meds: . atorvastatin  80 mg Oral QHS  . clopidogrel  75 mg Oral Daily  . enoxaparin (LOVENOX) injection  40 mg Subcutaneous Daily  . fenofibrate  160 mg Oral Daily  . gabapentin  600 mg Oral BID  . insulin aspart  0-9 Units Subcutaneous TID WC  . irbesartan  300 mg Oral Daily  . latanoprost  1 drop Both Eyes QHS  . mometasone-formoterol  2 puff Inhalation BID  . nortriptyline  100 mg Oral QHS  . pantoprazole  40 mg Oral BID  . sertraline  50 mg Oral Daily  . timolol  1 drop Both Eyes BID   Continuous Infusions:   LOS: 0 days        Undrea Shipes Annett Gula, MD

## 2020-07-22 NOTE — ED Notes (Signed)
To mri 

## 2020-07-22 NOTE — ED Notes (Signed)
Requested pain meds from MD.  MD aware and responded.

## 2020-07-22 NOTE — ED Notes (Signed)
Tele

## 2020-07-22 NOTE — ED Notes (Signed)
The pt has been sleeping still

## 2020-07-22 NOTE — ED Notes (Signed)
Pt returned from mri

## 2020-07-22 NOTE — ED Notes (Signed)
Pt speaking very clearly on phone, though interactions with staff and formal NIH testing, pt's speech was slurred.

## 2020-07-22 NOTE — Progress Notes (Signed)
vLTM started  Event button tested   New leads.  Neurology notified

## 2020-07-22 NOTE — Progress Notes (Signed)
Pt has been admitted to the unit. Pt has all IVs intact and tele connected. All belongings are sent with the patient. Pt complains of no pain. Telephone and Call light are within reach and dinner has been ordered.

## 2020-07-22 NOTE — H&P (Signed)
History and Physical    Nicholas [name] ZOX:[id number] DOB: Apr 17, 1961 DOA: 07/21/2020  PCP: Patient, No Pcp Per  Patient coming from: Home.  Chief Complaint: Right-sided weakness and seizures.  HPI: Nicholas [name] is a 59 y.o. male with history of seizures, asthma, hypertension, diabetes mellitus type 2, hyperlipidemia was brought to the ER after patient started having right-sided weakness.  Exact time of origin is unclear.  Patient did come to the ER 2 days ago with similar complaints and was discharged home.  Patient states he was having headache all through the day yesterday and started developing right-sided weakness.  EMS was called and patient was brought to the ER.  On the way patient was noticed to have a generalized tonic-clonic seizure lasting for around 45 seconds which was aborted by IV Versed 2.5 mg.  ED Course: In the ER patient is found to have weakness of the right upper and lower extremity code stroke was initially called.  Neurology was consulted.  CT head followed by CT angiogram of the head and neck did not show anything acute.  Code stroke was canceled.  While having the CT scan patient had a generalized tonic-clonic seizure lasting for around 45 seconds and was given Keppra loading dose.  MRI brain is unremarkable.  EKG shows normal sinus rhythm.  Patient afebrile.  Patient was slightly somnolent after the second seizure and also after receiving the Keppra.  Covid test is negative.  Labs are largely at baseline with hemoglobin of 11 comparable to the one done 2 days ago.  Creatinine about 1.3.  Review of Systems: As per HPI, rest all negative.   Past Medical History:  Diagnosis Date  . Asthma   . Bipolar 1 disorder (HCC)   . Borderline glaucoma   . Chronic pain   . Epididymal pain    LEFT  . 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 Feeling of incomplete bladder emptying   .  Frequency of urination   . Gastric ulcer   . GERD (gastroesophageal reflux disease)   . Hypertension   . Hyperthyroidism    NO MEDS   . Migraine   . Seizures (HCC)   . TIA (transient ischemic attack)   . Type 2 diabetes mellitus (HCC)     Past Surgical History:  Procedure Laterality Date  . ABDOMINAL SURGERY    . ANTERIOR CERVICAL DECOMP/DISCECTOMY FUSION  2007   C4  --  C6  . CERVICAL FUSION    . CHOLECYSTECTOMY    . CYSTOSCOPY N/A 11/09/2013   Procedure: CYSTOSCOPY FLEXIBLE;  Surgeon: Bjorn Pippin, MD;  Location: [facility];  Service: Urology;  Laterality: N/A;  . EPIDIDYMECTOMY Left 11/09/2013   Procedure:  LEFT EPIDIDYMECTOMY;  Surgeon: Bjorn Pippin, MD;  Location: [facility];  Service: Urology;  Laterality: Left;  POSSIBLE OUTPATIENT WITH OBSERVATION  . EXCISION LIPOMA LEFT SHOULDER  2004 (APPROX)  . MULTIPLE CYST REMOVED FROM CHEST  AGE 23  . OTHER SURGICAL HISTORY     hemorroid surgery   . TESTICLE REMOVAL Left   . TONSILLECTOMY       reports that he quit smoking about 27 years ago. His smoking use included cigarettes. He has a 3.75 pack-year smoking history. He has never used smokeless tobacco. He reports that he does not drink alcohol and does not use  drugs.  Allergies  Allergen Reactions  . Levothyroxine Anaphylaxis and Cough    Chronic cough  . Nsaids Other (See Comments)    D/t gastric ulcer  . Amoxicillin Itching and Other (See Comments)    THRUSH Causes sores in mouth  . Ampicillin Other (See Comments)    THRUSH  . Penicillins Itching and Other (See Comments)    THRUSH Causes sores in mouth  . Strawberry Extract Swelling    LIPS SWELL  . Asa [Aspirin] Other (See Comments)    Bleeding   . Bactrim [Sulfamethoxazole-Trimethoprim] Hives, Itching and Other (See Comments)    GI upset  . Depakote [Divalproex Sodium]     Causes double vision and speech problems   . Dilantin [Phenytoin] Other (See Comments)    Severe skin peeling   . Methocarbamol Other (See Comments)    dizziness  . Risperidone And Related Other (See Comments)    Hallucinations   . Sitagliptin Other (See Comments)    pancreatitis  . Strawberry (Diagnostic) Itching and Swelling  . Sulfa Antibiotics Other (See Comments)    Affects thyroid   . Tolmetin Other (See Comments)    D/t gastric ulcer  . Ultram [Tramadol Hcl] Other (See Comments)    D/t gastric ulcer  . Oatmeal Hives and Other (See Comments)    White spots/sores in mouth    Family History  Problem Relation Age of Onset  . Heart failure Mother   . Diabetes Mother   . Hypertension Mother   . Cirrhosis Father   . Heart failure Father   . Kidney disease Father     Prior to Admission medications   Medication Sig Start Date End Date Taking? Authorizing Provider  acetaminophen (TYLENOL 8 HOUR) 650 MG CR tablet Take 1 tablet (650 mg total) by mouth every 8 (eight) hours. 05/09/19  Yes Derwood Kaplan, MD  albuterol (PROVENTIL HFA;VENTOLIN HFA) 108 (90 Base) MCG/ACT inhaler Inhale 1-2 puffs into the lungs every 6 (six) hours as needed for wheezing or shortness of breath. 11/15/18  Yes Rancour, Jeannett Senior, MD  atorvastatin (LIPITOR) 80 MG tablet Take 80 mg by mouth at bedtime.    Yes [provider]  budesonide-formoterol (SYMBICORT) 80-4.5 MCG/ACT inhaler Inhale 2 puffs into the lungs daily.   Yes [provider]  clopidogrel (PLAVIX) 75 MG tablet Take 1 tablet (75 mg total) by mouth daily. 07/05/20  Yes Azucena Fallen, MD  ergocalciferol (VITAMIN D2) 1.25 MG (50000 UT) capsule Take 50,000 Units by mouth every Monday.   Yes [provider]  fenofibrate (TRICOR) 145 MG tablet Take 145 mg by mouth daily. 10/24/19  Yes [provider]  fluticasone-salmeterol (ADVAIR HFA) 115-21 MCG/ACT inhaler Inhale 2 puffs into the lungs daily.   Yes [provider]  gabapentin (NEURONTIN) 300 MG capsule Take 2 capsules (600 mg total) by mouth 2 (two) times  daily. 07/06/20 08/05/20 Yes Azucena Fallen, MD  HUMALOG KWIKPEN 200 UNIT/ML KwikPen Inject 0-10 Units into the skin 3 (three) times daily with meals. 07/05/20  Yes Azucena Fallen, MD  latanoprost (XALATAN) 0.005 % ophthalmic solution Place 1 drop into both eyes at bedtime.  07/04/20  Yes [provider]  levETIRAcetam (KEPPRA) 1000 MG tablet Take 1 tablet (1,000 mg total) by mouth 2 (two) times daily. 07/05/20  Yes Azucena Fallen, MD  metFORMIN (GLUCOPHAGE) 1000 MG tablet Take 1 tablet (1,000 mg total) by mouth 2 (two) times daily. 07/05/20  Yes Azucena Fallen, MD  nortriptyline (PAMELOR) 50 MG capsule Take 100 mg by mouth at bedtime.    Yes [provider]  pantoprazole (PROTONIX) 40 MG tablet Take 40 mg by mouth 2 (two) times daily. 07/03/20  Yes [provider]  polyethylene glycol powder (GLYCOLAX/MIRALAX) 17 GM/SCOOP powder Take 17 g by mouth daily as needed for mild constipation. 12/31/15  Yes [provider]  rizatriptan (MAXALT) 10 MG tablet Take 10 mg by mouth daily. May repeat in 2 hours if needed    Yes [provider]  sertraline (ZOLOFT) 50 MG tablet Take 50 mg by mouth daily. 06/13/20  Yes [provider]  timolol (TIMOPTIC) 0.5 % ophthalmic solution Place 1 drop into both eyes 2 (two) times daily.  04/06/20  Yes [provider]  valsartan (DIOVAN) 320 MG tablet Take 320 mg by mouth daily. 07/04/20  Yes [provider]  zonisamide (ZONEGRAN) 100 MG capsule Take 200 mg by mouth 2 (two) times daily.  05/17/20  Yes [provider]    Physical Exam: Constitutional: Moderately built and nourished. Vitals:   07/21/20 2230 07/21/20 2245 07/21/20 2315 07/21/20 2316  BP: 114/79 116/78 113/78   Pulse: 69 66 65   Resp: 15 16 16    Temp:      TempSrc:      SpO2: 100% 100% 99%   Weight:    86 kg  Height:    5\' 7"  (1.702 m)   Eyes: Anicteric no pallor. ENMT: No discharge from the ears eyes nose or  mouth. Neck: No mass felt.  No neck rigidity. Respiratory: No rhonchi or crepitations. Cardiovascular: S1-S2 heard. Abdomen: Soft nontender bowel sounds present. Musculoskeletal: No edema. Skin: No rash. Neurologic: Patient is somnolent but easily arousable and still not able to talk well.  States he has been having some headache.  Unable to move his right upper and lower extremities.  Able to move his left upper and lower extremities. Psychiatric: Has difficulty talking but otherwise appears normal.   Labs on Admission: I have personally reviewed following labs and imaging studies  CBC: Recent Labs  Lab 07/18/20 1844 07/21/20 2124 07/21/20 2133  WBC 5.9 5.9  --   NEUTROABS  --  3.5  --   HGB 11.8* 11.0* 11.9*  HCT 37.7* 35.0* 35.0*  MCV 87.9 86.8  --   PLT 171 151  --    Basic Metabolic Panel: Recent Labs  Lab 07/18/20 1844 07/21/20 2124 07/21/20 2133  NA 139 140 143  K 4.7 4.1 4.0  CL 109 109 111  CO2 22 22  --   GLUCOSE 133* 88 83  BUN 14 18 20   CREATININE 1.33* 1.30* 1.20  CALCIUM 9.2 9.6  --    GFR: Estimated Creatinine Clearance: 70.3 mL/min (by C-G formula based on SCr of 1.2 mg/dL). Liver Function Tests: Recent Labs  Lab 07/21/20 2124  AST 23  ALT 30  ALKPHOS 54  BILITOT 0.3  PROT 6.6  ALBUMIN 4.0   No results for input(s): LIPASE, AMYLASE in the last 168 hours. No results for input(s): AMMONIA in the last 168 hours. Coagulation Profile: Recent Labs  Lab 07/21/20 2124  INR 1.1   Cardiac Enzymes: No results for input(s): CKTOTAL, CKMB, CKMBINDEX, TROPONINI in the last 168 hours. BNP (last 3 results) No results for input(s): PROBNP in the last 8760 hours. HbA1C: No results for input(s): HGBA1C in the last 72 hours. CBG: Recent Labs  Lab 07/21/20 2116  GLUCAP 85   Lipid Profile:  No results for input(s): CHOL, HDL, LDLCALC, TRIG, CHOLHDL, LDLDIRECT in the last 72 hours. Thyroid Function Tests: No results for input(s): TSH, T4TOTAL,  FREET4, T3FREE, THYROIDAB in the last 72 hours. Anemia Panel: No results for input(s): VITAMINB12, FOLATE, FERRITIN, TIBC, IRON, RETICCTPCT in the last 72 hours. Urine analysis:    Component Value Date/Time   COLORURINE YELLOW 05/03/2020 0250   APPEARANCEUR CLEAR 05/03/2020 0250   LABSPEC 1.021 05/03/2020 0250   PHURINE 5.0 05/03/2020 0250   GLUCOSEU 50 (A) 05/03/2020 0250   HGBUR NEGATIVE 05/03/2020 0250   BILIRUBINUR NEGATIVE 05/03/2020 0250   KETONESUR NEGATIVE 05/03/2020 0250   PROTEINUR NEGATIVE 05/03/2020 0250   UROBILINOGEN 0.2 08/29/2015 1125   NITRITE NEGATIVE 05/03/2020 0250   LEUKOCYTESUR NEGATIVE 05/03/2020 0250   Sepsis Labs: @LABRCNTIP (procalcitonin:4,lacticidven:4) ) Recent Results (from the past 240 hour(s))  SARS Coronavirus 2 by RT PCR (hospital order, performed in [facility] hospital lab) Nasopharyngeal Nasopharyngeal Swab     Status: None   Collection Time: 07/21/20  9:45 PM   Specimen: Nasopharyngeal Swab  Result Value Ref Range Status   SARS Coronavirus 2 NEGATIVE NEGATIVE Final    Comment: (NOTE) SARS-CoV-2 target nucleic acids are NOT DETECTED.  The SARS-CoV-2 RNA is generally detectable in upper and lower respiratory specimens during the acute phase of infection. The lowest concentration of SARS-CoV-2 viral copies this assay can detect is 250 copies / mL. A negative result does not preclude SARS-CoV-2 infection and should not be used as the sole basis for treatment or other patient management decisions.  A negative result may occur with improper specimen collection / handling, submission of specimen other than nasopharyngeal swab, presence of viral mutation(s) within the areas targeted by this assay, and inadequate number of viral copies (<250 copies / mL). A negative result must be combined with clinical observations, patient history, and epidemiological information.  Fact Sheet for Patients:   BoilerBrush.com.cy  Fact  Sheet for Healthcare Providers: https://pope.com/  This test is not yet approved or  cleared by the [place] FDA and has been authorized for detection and/or diagnosis of SARS-CoV-2 by FDA under an Emergency Use Authorization (EUA).  This EUA will remain in effect (meaning this test can be used) for the duration of the COVID-19 declaration under Section 564(b)(1) of the Act, 21 U.S.C. section 360bbb-3(b)(1), unless the authorization is terminated or revoked sooner.  Performed at [facility], [address], [place], [place] [address]      Radiological Exams on Admission: MR BRAIN WO CONTRAST  Result Date: 07/22/2020 CLINICAL DATA:  Follow-up examination for acute stroke. EXAM: MRI HEAD WITHOUT CONTRAST TECHNIQUE: Multiplanar, multiecho pulse sequences of the brain and surrounding structures were obtained without intravenous contrast. COMPARISON:  Prior CTs from 07/21/2020. FINDINGS: Brain: Cerebral volume within normal limits for patient age. No focal parenchymal signal abnormality identified. No abnormal foci of restricted diffusion to suggest acute or subacute ischemia. Gray-white matter differentiation well maintained. No encephalomalacia to suggest chronic infarction. No foci of susceptibility artifact to suggest acute or chronic intracranial hemorrhage. No mass lesion, midline shift or mass effect. No hydrocephalus. No extra-axial fluid collection. Major dural sinuses are grossly patent. Pituitary gland and suprasellar region are normal. Midline structures intact and normal. Vascular: Major intracranial vascular flow voids well maintained and normal in appearance. Skull and upper cervical spine: Craniocervical junction within normal limits. Postoperative changes partially visualized within the upper cervical spine. No focal marrow replacing lesion. Scalp soft tissues unremarkable. Sinuses/Orbits: Globes and orbital  soft tissues within normal limits.  Paranasal sinuses are clear. No mastoid effusion. Inner ear structures normal. Other: None. IMPRESSION: Stable negative brain MRI. No acute intracranial abnormality identified. Electronically Signed   By: Rise Mu M.D.   On: 07/22/2020 03:19   CT HEAD CODE STROKE WO CONTRAST  Result Date: 07/21/2020 CLINICAL DATA:  Code stroke. Initial evaluation for acute right-sided weakness, slurred speech. EXAM: CT HEAD WITHOUT CONTRAST TECHNIQUE: Contiguous axial images were obtained from the base of the skull through the vertex without intravenous contrast. COMPARISON:  Prior study from 07/04/2020. FINDINGS: Brain: Cerebral volume within normal limits for patient age. No evidence for acute intracranial hemorrhage. No findings to suggest acute large vessel territory infarct. No mass lesion, midline shift, or mass effect. Ventricles are normal in size without evidence for hydrocephalus. No extra-axial fluid collection identified. Vascular: No hyperdense vessel identified. Skull: Scalp soft tissues demonstrate no acute abnormality. Calvarium intact. Sinuses/Orbits: Globes and orbital soft tissues within normal limits. Visualized paranasal sinuses are clear. No mastoid effusion. ASPECTS [employer] Stroke Program Early CT Score) - Ganglionic level infarction (caudate, lentiform nuclei, internal capsule, insula, M1-M3 cortex): 7 - Supraganglionic infarction (M4-M6 cortex): 3 Total score (0-10 with 10 being normal): 10 IMPRESSION: 1. No acute intracranial infarct or other abnormality. 2. ASPECTS is 10. These results were communicated to Dr. Otelia Limes at 9:37 pmon 8/15/2021by text page via the [facility] messaging system. Electronically Signed   By: Rise Mu M.D.   On: 07/21/2020 21:39   CT ANGIO HEAD CODE STROKE  Result Date: 07/21/2020 CLINICAL DATA:  Initial evaluation for acute stroke, speech difficulty, right-sided weakness. EXAM: CT ANGIOGRAPHY HEAD AND NECK TECHNIQUE: Multidetector CT imaging of the head  and neck was performed using the standard protocol during bolus administration of intravenous contrast. Multiplanar CT image reconstructions and MIPs were obtained to evaluate the vascular anatomy. Carotid stenosis measurements (when applicable) are obtained utilizing NASCET criteria, using the distal internal carotid diameter as the denominator. CONTRAST:  OMNIPAQUE IOHEXOL 350 MG/ML SOLN COMPARISON:  Prior CTA from 07/04/2020. FINDINGS: CTA NECK FINDINGS Aortic arch: Visualized aortic arch of normal caliber with normal branch pattern. No flow-limiting stenosis seen about the origin of the great vessels. Right carotid system: Right common and internal carotid arteries widely patent without stenosis, dissection or occlusion. Left carotid system: Left common and internal carotid arteries widely patent without stenosis, dissection or occlusion. Vertebral arteries: Right vertebral artery dominant and arises from the right subclavian artery. Left vertebral artery diffusely hypoplastic and arises directly from the aortic arch. Visualized vertebral arteries widely patent without stenosis, dissection or occlusion. Evaluation of the V2 segments mildly limited by streak artifact from cervical fusion hardware. Skeleton: No acute osseous abnormality. No discrete or worrisome osseous lesions. Prior fusion at C4 through C7. Trace grade 1 anterolisthesis of C7 on T1, chronic and facet mediated. Other neck: No other acute soft tissue abnormality within the neck. No mass lesion or adenopathy. Upper chest: Scattered atelectatic changes noted within the visualized lungs. Partially visualized upper chest demonstrates no other acute finding. Review of the MIP images confirms the above findings CTA HEAD FINDINGS Anterior circulation: Internal carotid arteries widely patent to the termini without stenosis. A1 segments widely patent. Normal anterior communicating artery complex. Anterior cerebral arteries patent to their distal  aspects without stenosis. No M1 stenosis or occlusion. Normal MCA bifurcations. Distal MCA branches well perfused and symmetric. Posterior circulation: Vertebral arteries widely patent to the vertebrobasilar junction without stenosis. Left PICA patent. Right PICA  not seen. Basilar widely patent to its distal aspect without stenosis. Superior cerebral arteries patent bilaterally. Both PCAs primarily supplied via the basilar and are well perfused to their distal aspects. Prominent right posterior communicating artery noted. Venous sinuses: Patent. Anatomic variants: None significant.  No intracranial aneurysm. Review of the MIP images confirms the above findings IMPRESSION: Negative CTA of the head and neck for large vessel occlusion. No hemodynamically significant stenosis or other acute vascular abnormality. No aneurysm. These results were communicated to Dr. Otelia Limes at 10:08 pmon 8/15/2021by text page via the [facility] messaging system. Electronically Signed   By: Rise Mu M.D.   On: 07/21/2020 22:11   CT ANGIO NECK CODE STROKE  Result Date: 07/21/2020 CLINICAL DATA:  Initial evaluation for acute stroke, speech difficulty, right-sided weakness. EXAM: CT ANGIOGRAPHY HEAD AND NECK TECHNIQUE: Multidetector CT imaging of the head and neck was performed using the standard protocol during bolus administration of intravenous contrast. Multiplanar CT image reconstructions and MIPs were obtained to evaluate the vascular anatomy. Carotid stenosis measurements (when applicable) are obtained utilizing NASCET criteria, using the distal internal carotid diameter as the denominator. CONTRAST:  OMNIPAQUE IOHEXOL 350 MG/ML SOLN COMPARISON:  Prior CTA from 07/04/2020. FINDINGS: CTA NECK FINDINGS Aortic arch: Visualized aortic arch of normal caliber with normal branch pattern. No flow-limiting stenosis seen about the origin of the great vessels. Right carotid system: Right common and internal carotid arteries widely  patent without stenosis, dissection or occlusion. Left carotid system: Left common and internal carotid arteries widely patent without stenosis, dissection or occlusion. Vertebral arteries: Right vertebral artery dominant and arises from the right subclavian artery. Left vertebral artery diffusely hypoplastic and arises directly from the aortic arch. Visualized vertebral arteries widely patent without stenosis, dissection or occlusion. Evaluation of the V2 segments mildly limited by streak artifact from cervical fusion hardware. Skeleton: No acute osseous abnormality. No discrete or worrisome osseous lesions. Prior fusion at C4 through C7. Trace grade 1 anterolisthesis of C7 on T1, chronic and facet mediated. Other neck: No other acute soft tissue abnormality within the neck. No mass lesion or adenopathy. Upper chest: Scattered atelectatic changes noted within the visualized lungs. Partially visualized upper chest demonstrates no other acute finding. Review of the MIP images confirms the above findings CTA HEAD FINDINGS Anterior circulation: Internal carotid arteries widely patent to the termini without stenosis. A1 segments widely patent. Normal anterior communicating artery complex. Anterior cerebral arteries patent to their distal aspects without stenosis. No M1 stenosis or occlusion. Normal MCA bifurcations. Distal MCA branches well perfused and symmetric. Posterior circulation: Vertebral arteries widely patent to the vertebrobasilar junction without stenosis. Left PICA patent. Right PICA not seen. Basilar widely patent to its distal aspect without stenosis. Superior cerebral arteries patent bilaterally. Both PCAs primarily supplied via the basilar and are well perfused to their distal aspects. Prominent right posterior communicating artery noted. Venous sinuses: Patent. Anatomic variants: None significant.  No intracranial aneurysm. Review of the MIP images confirms the above findings IMPRESSION: Negative CTA  of the head and neck for large vessel occlusion. No hemodynamically significant stenosis or other acute vascular abnormality. No aneurysm. These results were communicated to Dr. Otelia Limes at 10:08 pmon 8/15/2021by text page via the [facility] messaging system. Electronically Signed   By: Rise Mu M.D.   On: 07/21/2020 22:11    EKG: Independently reviewed.  Normal sinus rhythm.  Assessment/Plan Principal Problem:   Seizure (HCC) Active Problems:   Insulin-requiring or dependent type II diabetes mellitus (HCC)  CKD (chronic kidney disease), stage III   Essential hypertension    1. Possible seizure with right-sided weakness for which I discussed with Dr. Otelia LimesLindzen.  At this time patient's physical findings are inconsistent.  MRI brain is negative.  Will observe for now continue with patient's seizure medications.  Will await formal recommendations from neurology. 2. Diabetes mellitus type 2 we will keep patient on sliding scale coverage. 3. Hyperlipidemia on statins and fenofibrate. 4. Hypertension on ARB.  Note the patient's creatinine is 1.3. 5. Anemia follow CBC.   DVT prophylaxis: Lovenox. Code Status: Full code. Family Communication: Discussed with patient. Disposition Plan: Home. Consults called: Neurology. Admission status: Observation.   Eduard ClosArshad N Xitlali Kastens MD [facility] Pager [phone].  If 7PM-7AM, please contact night-coverage [email or web address] Password [email or web address]  07/22/2020, 4:10 AM

## 2020-07-22 NOTE — Procedures (Addendum)
Patient Name: Nicholas [name]  MRN: [id number]  Epilepsy Attending: Charlsie Quest  Referring Physician/Provider: Dr. Caryl Pina Date: 07/22/2020 Duration: 23.55 mins  Patient history: 59 year old male with past medical history of bipolar disorder and epilepsy who presented with right-sided weakness post seizure-like episode.  EEG to evaluate for seizures.  Level of alertness: Awake  AEDs during EEG study: Keppra, zonisamide, gabapentin  Technical aspects: This EEG study was done with scalp electrodes positioned according to the 10-20 International system of electrode placement. Electrical activity was acquired at a sampling rate of 500Hz  and reviewed with a high frequency filter of 70Hz  and a low frequency filter of 1Hz . EEG data were recorded continuously and digitally stored.   Description: The posterior dominant rhythm consists of 8-9 Hz activity of moderate voltage (25-35 uV) seen predominantly in posterior head regions, symmetric and reactive to eye opening and eye closing. Sleep was characterized by vertex waves, sleep spindles (12 to 14 Hz), maximal frontocentral region.    During photic stimulation at 3 Hz, patient was noted to have left upper extremity nonrhythmic fast tremor-like activity which gradually progressed to whole body shaking. Patient was initially laying with eyes closed and not responding and later was noted to have teeth chattering and side-to-side movement of the mandible.  At the end of the seizure-like activity, patient opened his eyes, was looking straight and not following commands.  EEG tech calpped next to patient's ears and patient started looking at EEG tech but still not answering questions or following commands.  When photic summation was repeated, patient had similar episode again.  Concomitant EEG before, during and after the event did not show any EEG to suggest seizure.    Hyperventilation was not performed.     IMPRESSION: This study is within normal  limits. No seizures or epileptiform discharges were seen throughout the recording.  During photic stimulation patient was noted to have nonrhythmic left arm twitching followed by whole body twitching without concomitant EEG change.  This was a nonepileptic event.   Mavi Un 

## 2020-07-22 NOTE — ED Notes (Signed)
Neuro check late  Nurse in with cardioversion

## 2020-07-23 DIAGNOSIS — N182 Chronic kidney disease, stage 2 (mild): Secondary | ICD-10-CM | POA: Diagnosis present

## 2020-07-23 DIAGNOSIS — R200 Anesthesia of skin: Secondary | ICD-10-CM | POA: Diagnosis present

## 2020-07-23 DIAGNOSIS — I444 Left anterior fascicular block: Secondary | ICD-10-CM | POA: Diagnosis present

## 2020-07-23 DIAGNOSIS — R4182 Altered mental status, unspecified: Secondary | ICD-10-CM | POA: Diagnosis present

## 2020-07-23 DIAGNOSIS — R471 Dysarthria and anarthria: Secondary | ICD-10-CM | POA: Diagnosis present

## 2020-07-23 DIAGNOSIS — I129 Hypertensive chronic kidney disease with stage 1 through stage 4 chronic kidney disease, or unspecified chronic kidney disease: Secondary | ICD-10-CM | POA: Diagnosis present

## 2020-07-23 DIAGNOSIS — E119 Type 2 diabetes mellitus without complications: Secondary | ICD-10-CM | POA: Diagnosis not present

## 2020-07-23 DIAGNOSIS — H53461 Homonymous bilateral field defects, right side: Secondary | ICD-10-CM | POA: Diagnosis present

## 2020-07-23 DIAGNOSIS — I10 Essential (primary) hypertension: Secondary | ICD-10-CM | POA: Diagnosis not present

## 2020-07-23 DIAGNOSIS — R2990 Unspecified symptoms and signs involving the nervous system: Secondary | ICD-10-CM | POA: Diagnosis not present

## 2020-07-23 DIAGNOSIS — Z8673 Personal history of transient ischemic attack (TIA), and cerebral infarction without residual deficits: Secondary | ICD-10-CM | POA: Diagnosis not present

## 2020-07-23 DIAGNOSIS — R4781 Slurred speech: Secondary | ICD-10-CM | POA: Diagnosis present

## 2020-07-23 DIAGNOSIS — Z981 Arthrodesis status: Secondary | ICD-10-CM | POA: Diagnosis not present

## 2020-07-23 DIAGNOSIS — R569 Unspecified convulsions: Secondary | ICD-10-CM | POA: Diagnosis present

## 2020-07-23 DIAGNOSIS — D631 Anemia in chronic kidney disease: Secondary | ICD-10-CM | POA: Diagnosis present

## 2020-07-23 DIAGNOSIS — M549 Dorsalgia, unspecified: Secondary | ICD-10-CM | POA: Diagnosis present

## 2020-07-23 DIAGNOSIS — R531 Weakness: Secondary | ICD-10-CM | POA: Diagnosis present

## 2020-07-23 DIAGNOSIS — R29810 Facial weakness: Secondary | ICD-10-CM | POA: Diagnosis present

## 2020-07-23 DIAGNOSIS — K219 Gastro-esophageal reflux disease without esophagitis: Secondary | ICD-10-CM | POA: Diagnosis present

## 2020-07-23 DIAGNOSIS — G43109 Migraine with aura, not intractable, without status migrainosus: Secondary | ICD-10-CM | POA: Diagnosis present

## 2020-07-23 DIAGNOSIS — H40009 Preglaucoma, unspecified, unspecified eye: Secondary | ICD-10-CM | POA: Diagnosis present

## 2020-07-23 DIAGNOSIS — E785 Hyperlipidemia, unspecified: Secondary | ICD-10-CM | POA: Diagnosis present

## 2020-07-23 DIAGNOSIS — N1832 Chronic kidney disease, stage 3b: Secondary | ICD-10-CM | POA: Diagnosis not present

## 2020-07-23 DIAGNOSIS — F319 Bipolar disorder, unspecified: Secondary | ICD-10-CM | POA: Diagnosis present

## 2020-07-23 DIAGNOSIS — J45909 Unspecified asthma, uncomplicated: Secondary | ICD-10-CM | POA: Diagnosis present

## 2020-07-23 DIAGNOSIS — G9341 Metabolic encephalopathy: Secondary | ICD-10-CM | POA: Diagnosis present

## 2020-07-23 DIAGNOSIS — E0590 Thyrotoxicosis, unspecified without thyrotoxic crisis or storm: Secondary | ICD-10-CM | POA: Diagnosis present

## 2020-07-23 DIAGNOSIS — E1122 Type 2 diabetes mellitus with diabetic chronic kidney disease: Secondary | ICD-10-CM | POA: Diagnosis present

## 2020-07-23 DIAGNOSIS — Z8616 Personal history of COVID-19: Secondary | ICD-10-CM | POA: Diagnosis not present

## 2020-07-23 LAB — GLUCOSE, CAPILLARY
Glucose-Capillary: 125 mg/dL — ABNORMAL HIGH (ref 70–99)
Glucose-Capillary: 189 mg/dL — ABNORMAL HIGH (ref 70–99)
Glucose-Capillary: 162 mg/dL — ABNORMAL HIGH (ref 70–99)
Glucose-Capillary: 143 mg/dL — ABNORMAL HIGH (ref 70–99)
Glucose-Capillary: 209 mg/dL — ABNORMAL HIGH (ref 70–99)

## 2020-07-23 MED ORDER — MAGNESIUM SULFATE 50 % IJ SOLN
1.0000 g | Freq: Once | INTRAMUSCULAR | Status: AC
  Administered 2020-07-23: 1 g via INTRAVENOUS
  Filled 2020-07-23 (×2): qty 2

## 2020-07-23 MED ORDER — KETOROLAC TROMETHAMINE 30 MG/ML IJ SOLN
30.0000 mg | Freq: Once | INTRAMUSCULAR | Status: AC
  Administered 2020-07-23: 30 mg via INTRAVENOUS
  Filled 2020-07-23: qty 1

## 2020-07-23 MED ORDER — PROCHLORPERAZINE EDISYLATE 10 MG/2ML IJ SOLN
10.0000 mg | Freq: Once | INTRAMUSCULAR | Status: AC
  Administered 2020-07-23: 10 mg via INTRAVENOUS
  Filled 2020-07-23: qty 2

## 2020-07-23 NOTE — Progress Notes (Signed)
PROGRESS NOTE    Nicholas [name]  VPX:[id number] DOB: 13-Aug-1961 DOA: 07/21/2020 PCP: Patient, No Pcp Per    Brief Narrative:  Patient was admitted to the hospital with the working diagnosis of altered mental status to rule out seizures.  59 year old male with a significant past medical history for seizures, asthma, hypertension, type 2 diabetes mellitus, and dyslipidemia. Patient reported right-sided weakness,unclear about timing. Because of persistent symptoms he called EMS and he was brought to the hospital, on route he was witnessed to have generalized tonic-clonic seizure for about 45 seconds, which was treated with 2.5 mg of intravenous midazolam. Patient was reevaluated in the ED, well undergoing head CT scan he developed generalized tonic-clonic seizure, lasting for about 45 seconds, he received a loading dose of Keppra.  On his initial physical examination blood pressure 114/79, heart rate 69, respirate 15, oxygen saturation 99%. He was awake and alert, his lungs are clear to auscultation bilaterally, heart S1-S2, present rhythmic, soft abdomen, no extremity edema. Positive antiepileptic agent he was somnolent.  Sodium 140, potassium 4.1, chloride 109, bicarb 22, glucose 88, BUN 18, creatinine 1.30, white count 5.9, hemoglobin 11.0, hematocrit 35.0, platelets 151. SARS COVID-19 negative. EKG 68 bpm, left axis deviation, left anterior fascicular block, normal intervals, manually corrected QTC 387, sinus rhythm, poor R wave progression, no ST segment or T wave changes.  Head CT no acute changes. Head and neck CT angiography with no large vessel occlusion. No hemodynamic significant stenosis or other acute vascular abnormality, no aneurysm. Brain MRI with no acute changes. EEG with no active seizure activity.   Patient has been placed on a continuous EEG monitoring to rule out seizures.    Assessment & Plan:   Principal Problem:   Seizure (HCC) Active Problems:    Insulin-requiring or dependent type II diabetes mellitus (HCC)   CKD (chronic kidney disease), stage III   Essential hypertension   1. Altered mental status to rule out seizures. Patient has been placed on continuous EEG monitoring. So far with no electroencephalographic epileptic activity but noted to have one episode (07/22/20 at 17:08) of vigorously shaking his head side to side, opening and closing his eyes followed by snapping himself which lasted for about 20 seconds.  All seizure medications have been stopped. Patient continue to be concerned about active uncontrolled seizures.   Case discussed with Neurology, Dr Melynda Ripple, will continue with EEG monitoring for now. Continue close neurologic monitoring.   The clinical possibility of seizures is low, but patient continue to experience symptoms and continue to be very concerned about his safety.   2. T2DM/ dyslipidemia. Fasting glucose this am 125 mg/dl. Will continue glucose cover and monitoring with insulin sliding scale.   On atorvastatin 80 mg daily and fenofibrate.   3. HTN. On irbesartan for blood pressure control. Continue with clopidogrel.   4. Anemia chronic anemia, follow as outpatient.   5. CKD stage 2. Renal function with serum cr at 1,15 with K at 4,0 and serum bicarbonate at 21.   Patient is tolerating po well, no nausea or vomiting.   6. Depression. Patient had recent family loss, within one year, has been under increased psychological stress at home.  On setraline and nortriptyline  7. Back pain. Pain has improved with oral analgesics (hydrocodone/ acetaminophen), will continue with gabapentin.  Will have patient evaluated by PT/OT before discharge.   8. Asthma. On as needed albuterol and bid dulera. No sings of acute exacerbation.    Status is: Observation  The patient will require care spanning > 2 midnights and should be moved to inpatient because: Ongoing diagnostic testing needed not appropriate for  outpatient work up. Patient continue to require continuous eeg monitoring, to rule out seizures.   Dispo: The patient is from: Home              Anticipated d/c is to: Home              Anticipated d/c date is: 1 day              Patient currently is not medically stable to d/c.   DVT prophylaxis: Enoxaparin   Code Status:   full  Family Communication:  No family at the bedside.      Consultants:   Neurology   Procedures:   Continuous eeg monitoring.      Subjective: Patient this am has intermittent paresthesias on his right upper extremity, no nausea or vomiting,. No chest pain or dyspnea.   Objective: Vitals:   07/22/20 1937 07/22/20 2339 07/23/20 0402 07/23/20 0740  BP: (!) 151/93 131/90 125/90   Pulse: 69 66 71 75  Resp: 18 18 19  (!) 21  Temp: 98.1 F (36.7 C) 98 F (36.7 C) 98.2 F (36.8 C)   TempSrc: Oral Oral Oral   SpO2: 98% 96% 95% 96%  Weight:      Height:       No intake or output data in the 24 hours ending 07/23/20 1046 Filed Weights   07/21/20 2316  Weight: 86 kg    Examination:   General: Not in pain or dyspnea. Neurology: Awake and alert, non focal  E ENT: no pallor, no icterus, oral mucosa moist Cardiovascular: No JVD. S1-S2 present, rhythmic, no gallops, rubs, or murmurs. No lower extremity edema. Pulmonary: positive breath sounds bilaterally, adequate air movement, no wheezing, rhonchi or rales. Gastrointestinal. Abdomen soft and non tender Skin. No rashes Musculoskeletal: no joint deformities     Data Reviewed: I have personally reviewed following labs and imaging studies  CBC: Recent Labs  Lab 07/18/20 1844 07/21/20 2124 07/21/20 2133 07/22/20 0754  WBC 5.9 5.9  --  5.0  NEUTROABS  --  3.5  --   --   HGB 11.8* 11.0* 11.9* 11.4*  HCT 37.7* 35.0* 35.0* 36.0*  MCV 87.9 86.8  --  85.9  PLT 171 151  --  152   Basic Metabolic Panel: Recent Labs  Lab 07/18/20 1844 07/21/20 2124 07/21/20 2133 07/22/20 0754  NA 139 140  143 141  K 4.7 4.1 4.0 4.0  CL 109 109 111 110  CO2 22 22  --  21*  GLUCOSE 133* 88 83 97  BUN 14 18 20 15   CREATININE 1.33* 1.30* 1.20 1.15  CALCIUM 9.2 9.6  --  9.2   GFR: Estimated Creatinine Clearance: 73.4 mL/min (by C-G formula based on SCr of 1.15 mg/dL). Liver Function Tests: Recent Labs  Lab 07/21/20 2124  AST 23  ALT 30  ALKPHOS 54  BILITOT 0.3  PROT 6.6  ALBUMIN 4.0   No results for input(s): LIPASE, AMYLASE in the last 168 hours. No results for input(s): AMMONIA in the last 168 hours. Coagulation Profile: Recent Labs  Lab 07/21/20 2124  INR 1.1   Cardiac Enzymes: No results for input(s): CKTOTAL, CKMB, CKMBINDEX, TROPONINI in the last 168 hours. BNP (last 3 results) No results for input(s): PROBNP in the last 8760 hours. HbA1C: No results for input(s): HGBA1C in the last 72  hours. CBG: Recent Labs  Lab 07/22/20 0645 07/22/20 1503 07/22/20 1714 07/22/20 2111 07/23/20 0606  GLUCAP 84 117* 185* 200* 125*   Lipid Profile: No results for input(s): CHOL, HDL, LDLCALC, TRIG, CHOLHDL, LDLDIRECT in the last 72 hours. Thyroid Function Tests: No results for input(s): TSH, T4TOTAL, FREET4, T3FREE, THYROIDAB in the last 72 hours. Anemia Panel: No results for input(s): VITAMINB12, FOLATE, FERRITIN, TIBC, IRON, RETICCTPCT in the last 72 hours.    Radiology Studies: I have reviewed all of the imaging during this hospital visit personally     Scheduled Meds: . atorvastatin  80 mg Oral QHS  . clopidogrel  75 mg Oral Daily  . enoxaparin (LOVENOX) injection  40 mg Subcutaneous Daily  . fenofibrate  160 mg Oral Daily  . gabapentin  600 mg Oral BID  . insulin aspart  0-9 Units Subcutaneous TID WC  . irbesartan  300 mg Oral Daily  . latanoprost  1 drop Both Eyes QHS  . mometasone-formoterol  2 puff Inhalation BID  . nortriptyline  100 mg Oral QHS  . pantoprazole  40 mg Oral BID  . sertraline  50 mg Oral Daily  . timolol  1 drop Both Eyes BID    Continuous Infusions:   LOS: 0 days        Merina Behrendt Annett Gula, MD

## 2020-07-23 NOTE — TOC Initial Note (Signed)
Transition of Care [facility]) - Initial/Assessment Note    Patient Details  Name: Nicholas [name] MRN: [id number] Date of Birth: 11-07-1961  Transition of Care [facility]) CM/SW Contact:    Kermit Balo, RN Phone Number: 07/23/2020, 12:24 PM  Clinical Narrative:                 Pt lives at [facility] [place]. Received walker last admit. Pt uses [employer] or [place] transportation for his transportation needs. Pt denies issues with home medications.  PCP: [facility] TOC following for d/c needs.   Expected Discharge Plan: Home/Self Care Barriers to Discharge: Continued Medical Work up   Patient Goals and CMS Choice        Expected Discharge Plan and Services Expected Discharge Plan: Home/Self Care   Discharge Planning Services: CM Consult   Living arrangements for the past 2 months: Hotel/Motel                                      Prior Living Arrangements/Services Living arrangements for the past 2 months: Hotel/Motel Lives with:: Self Patient language and need for interpreter reviewed:: Yes Do you feel safe going back to the place where you live?: Yes        Care giver support system in place?: No (comment)   Criminal Activity/Legal Involvement Pertinent to Current Situation/Hospitalization: No - Comment as needed  Activities of Daily Living      Permission Sought/Granted                  Emotional Assessment Appearance:: Appears stated age Attitude/Demeanor/Rapport: Engaged Affect (typically observed): Accepting Orientation: : Oriented to Self, Oriented to Place, Oriented to  Time, Oriented to Situation   Psych Involvement: No (comment)  Admission diagnosis:  Seizure (HCC) [R56.9] Stroke-like symptoms [R29.90] Patient Active Problem List   Diagnosis Date Noted  . Seizure (HCC) 07/22/2020  . Left-sided weakness 07/04/2020  . Dysarthria 07/04/2020  . Essential hypertension   . Seizure disorder (HCC) 07/02/2017  . Insulin-requiring or  dependent type II diabetes mellitus (HCC) 07/02/2017  . CKD (chronic kidney disease), stage III 07/02/2017  . Normocytic anemia 07/02/2017  . Testicular/scrotal pain 11/09/2013  . Microhematuria 11/09/2013  . Condyloma acuminatum of scrotum 11/09/2013   PCP:  Patient, No Pcp Per Pharmacy:   [id number] - [place], [place] - [address] [place] [address] Phone: [phone] Fax: [phone]  [facility] [place], [place] - [address] [place] [address] Phone: [phone] Fax: [phone]     Social Determinants of Health (SDOH) Interventions    Readmission Risk Interventions No flowsheet data found.

## 2020-07-23 NOTE — Progress Notes (Signed)
Subjective: States he had headache with worsening numbness on the right side of his body yesterday.  States his right upper and lower extremity strength is much better however numbness continues to be an issue.  Denies any other concerns.  States he lost his daughter 2 weeks after his marriage, his wife was diagnosed with cancer, has lost 2 brothers.  Inquired about pseudoseizures and if stress can cause pseudoseizures.   ROS: negative except above  Examination  Vital signs in last 24 hours: Temp:  [97.8 F (36.6 C)-98.2 F (36.8 C)] 98.2 F (36.8 C) (08/17 0402) Pulse Rate:  [62-75] 75 (08/17 0740) Resp:  [16-23] 21 (08/17 0740) BP: (118-151)/(85-93) 125/90 (08/17 0402) SpO2:  [94 %-98 %] 96 % (08/17 0740)  General: lying in bed, not in apparent distress CVS: pulse-normal rate and rhythm RS: breathing comfortably, CTA B Extremities: normal, warm Neuro: AOx3, cranial nerves II to XII grossly intact, 3/5 in right upper extremity however strongly suggestive of lack of effort from patient, 4/5 in right lower extremity, 5/5 in left upper and lower extremity, reduced touch in right upper and lower extremity as well as right face  Basic Metabolic Panel: Recent Labs  Lab 07/18/20 1844 07/21/20 04-05-2123 07/21/20 2132/04/04 07/22/20 0754  NA 139 140 143 141  K 4.7 4.1 4.0 4.0  CL 109 109 111 110  CO2 22 22  --  21*  GLUCOSE 133* 88 83 97  BUN 14 18 20 15   CREATININE 1.33* 1.30* 1.20 1.15  CALCIUM 9.2 9.6  --  9.2    CBC: Recent Labs  Lab 07/18/20 1844 07/21/20 April 05, 2123 07/21/20 April 04, 2132 07/22/20 0754  WBC 5.9 5.9  --  5.0  NEUTROABS  --  3.5  --   --   HGB 11.8* 11.0* 11.9* 11.4*  HCT 37.7* 35.0* 35.0* 36.0*  MCV 87.9 86.8  --  85.9  PLT 171 151  --  152     Coagulation Studies: Recent Labs    07/21/20 2123-04-05  LABPROT 14.2  INR 1.1    Imaging No new brain imaging overnight   ASSESSMENT AND PLAN: 59 year old male with history of chronic headache and seizure-like episodes who  presented with further seizure-like episode.  Migraine with aura Pseudoseizure/ nonepileptic spells -Routine EEG captured 2 events during which patient was noted of left side upper extremity jerking and followed by whole body twitching, not responding to questions.  No EEG change was seen during these events and these were nonepileptic.  Another event was recorded during LTM EEG after discontinuing Keppra and zonisamide on 07/22/2020 during which patient was noted to be vigorously shaking his head from side to side, opening closing his eyes followed by slapping himself but lasted for about 20 seconds without concomitant EEG change and was another nonepileptic event.  Recommendations - Continue LTM EEG to assess for potential epileptogenicity - We will continue to hold antiepileptics (Keppra and zonisamide)  -We will give one-time dose of IV Toradol, magnesium and Compazine for  migraine with aura  deprivation - Seizure precautions - Management of rest of home meds per primary team   I have spent a total of35 minuteswith the patient reviewing hospitalnotes,  test results, labs and examining the patient as well as establishing an assessment and plan that was discussed personally with the patient.>50% of time was spent in direct patient care.  07/24/2020 Epilepsy [facility] For questions after 5pm please refer to AMION to reach the Neurologist on call

## 2020-07-23 NOTE — Care Management Obs Status (Signed)
MEDICARE OBSERVATION STATUS NOTIFICATION   Patient Details  Name: Nicholas [name] MRN: [id number] Date of Birth: 25-Sep-1961   Medicare Observation Status Notification Given:  Yes    Kermit Balo, RN 07/23/2020, 11:20 AM

## 2020-07-23 NOTE — Procedures (Addendum)
Patient Name: Nicholas [name]  MRN: [id number]  Epilepsy Attending: Charlsie Quest  Referring Physician/Provider: Dr. Lindie Spruce Duration:  07/22/2020 1451 to 07/23/2020 1451  Patient history: 59 year old male with past medical history of bipolar disorder and epilepsy who presented with right-sided weakness post seizure-like episode.  EEG to evaluate for seizures.  Level of alertness: Awake, asleep  AEDs during EEG study:  gabapentin  Technical aspects: This EEG study was done with scalp electrodes positioned according to the 10-20 International system of electrode placement. Electrical activity was acquired at a sampling rate of 500Hz  and reviewed with a high frequency filter of 70Hz  and a low frequency filter of 1Hz . EEG data were recorded continuously and digitally stored.   Description: The posterior dominant rhythm consists of 8-9 Hz activity of moderate voltage (25-35 uV) seen predominantly in posterior head regions, symmetric and reactive to eye opening and eye closing. Sleep was characterized by vertex waves, sleep spindles (12 to 14 Hz), maximal frontocentral region.   On 07/22/2020 at 1708 during which patient was noted to be vigorously shaking his head from side to side, opening closing his eyes followed by slapping himself which lasted for about 20 seconds.  Concomitant EEG before, during and after the event did not show any EEG change to suggest seizure.    IMPRESSION: This study is within normal limits. No seizures or epileptiform discharges were seen throughout the recording.  Patient was noted to have another event on 8/16/121 at 1708 as described above without concomitant EEG change and was a nonepileptic event.   Aleisa Howk 

## 2020-07-24 DIAGNOSIS — R2990 Unspecified symptoms and signs involving the nervous system: Secondary | ICD-10-CM

## 2020-07-24 LAB — BASIC METABOLIC PANEL
Anion gap: 6 (ref 5–15)
BUN: 16 mg/dL (ref 6–20)
CO2: 22 mmol/L (ref 22–32)
Calcium: 9.4 mg/dL (ref 8.9–10.3)
Chloride: 110 mmol/L (ref 98–111)
Creatinine, Ser: 1.55 mg/dL — ABNORMAL HIGH (ref 0.61–1.24)
GFR calc Af Amer: 56 mL/min — ABNORMAL LOW (ref >60)
GFR calc non Af Amer: 49 mL/min — ABNORMAL LOW (ref >60)
Glucose, Bld: 167 mg/dL — ABNORMAL HIGH (ref 70–99)
Potassium: 4 mmol/L (ref 3.5–5.1)
Sodium: 138 mmol/L (ref 135–145)

## 2020-07-24 LAB — MAGNESIUM: Magnesium: 2.1 mg/dL (ref 1.7–2.4)

## 2020-07-24 LAB — GLUCOSE, CAPILLARY
Glucose-Capillary: 159 mg/dL — ABNORMAL HIGH (ref 70–99)
Glucose-Capillary: 160 mg/dL — ABNORMAL HIGH (ref 70–99)
Glucose-Capillary: 146 mg/dL — ABNORMAL HIGH (ref 70–99)
Glucose-Capillary: 178 mg/dL — ABNORMAL HIGH (ref 70–99)

## 2020-07-24 MED ORDER — SUMATRIPTAN SUCCINATE 100 MG PO TABS
100.0000 mg | ORAL_TABLET | Freq: Every day | ORAL | Status: DC | PRN
  Administered 2020-07-24: 100 mg via ORAL
  Filled 2020-07-24 (×2): qty 1

## 2020-07-24 NOTE — Plan of Care (Signed)
  Problem: Education: Goal: Knowledge of General Education information will improve Description: Including pain rating scale, medication(s)/side effects and non-pharmacologic comfort measures Outcome: Progressing   Problem: Nutrition: Goal: Adequate nutrition will be maintained Outcome: Progressing   Problem: Safety: Goal: Verbalization of understanding the information provided will improve Outcome: Progressing

## 2020-07-24 NOTE — Progress Notes (Signed)
PROGRESS NOTE  Nicholas [name] IFO:[id number] DOB: 19-Apr-1961 DOA: 07/21/2020 PCP: Patient, No Pcp Per   LOS: 1 day   Brief Narrative / Interim history: 59 year old male with history of seizure disorder, asthma, hypertension, DM 2, hyperlipidemia, came into the hospital with right-sided weakness as well as to generalized tonic-clonic seizures, one during transport by EMS towards the hospital and another one while in the CT scan.  Neurology has been consulted and he was placed on continuous EEG monitoring.  Subjective / 24h Interval events: Still complains of right hand weakness as well as right cheek numbness.  Assessment & Plan: Principal Problem Acute metabolic encephalopathy, rule out seizures-neurology consulted and following.  He is on continuous EEG, has had several spells while hospitalized however EEG did not show any epileptiform discharges.  Last episode was on 8/16, has been stable since.  He is now being closely monitored off of his antiepileptics.  Discussed with Dr. Melynda Ripple, monitor for another 24 hours and potentially discharge home on his chronic antibiotics tomorrow  Active Problems DM2 -Continue sliding scale  CBG (last 3)  Recent Labs    07/23/20 2059 07/24/20 0619 07/24/20 1131  GLUCAP 209* 159* 160*   Essential hypertension-continue home medications as below  Chronic kidney disease stage II- baseline around 1.3, has gone as high as 1.7-1.9 earlier this year.  Close to baseline currently at 1.5  Depression-continue Zoloft, nortriptyline  Asthma-no wheezing, continue as needed's   Scheduled Meds: . atorvastatin  80 mg Oral QHS  . clopidogrel  75 mg Oral Daily  . enoxaparin (LOVENOX) injection  40 mg Subcutaneous Daily  . fenofibrate  160 mg Oral Daily  . gabapentin  600 mg Oral BID  . insulin aspart  0-9 Units Subcutaneous TID WC  . irbesartan  300 mg Oral Daily  . latanoprost  1 drop Both Eyes QHS  . mometasone-formoterol  2 puff Inhalation BID  .  nortriptyline  100 mg Oral QHS  . pantoprazole  40 mg Oral BID  . sertraline  50 mg Oral Daily  . timolol  1 drop Both Eyes BID   Continuous Infusions: PRN Meds:.acetaminophen, albuterol, HYDROcodone-acetaminophen, ondansetron **OR** ondansetron (ZOFRAN) IV, SUMAtriptan  Diet Orders (From admission, onward)    Start     Ordered   07/22/20 0410  Diet heart healthy/carb modified Room service appropriate? Yes; Fluid consistency: Thin  Diet effective now       Question Answer Comment  Diet-HS Snack? Nothing   Room service appropriate? Yes   Fluid consistency: Thin      07/22/20 0410          DVT prophylaxis: enoxaparin (LOVENOX) injection 40 mg Start: 07/22/20 1000     Code Status: Full Code  Family Communication: no family at bedside   Status is: Inpatient  Remains inpatient appropriate because:Ongoing diagnostic testing needed not appropriate for outpatient work up   Dispo: The patient is from: Home              Anticipated d/c is to: Home              Anticipated d/c date is: 1 day              Patient currently is not medically stable to d/c.  Consultants:  Neurology   Procedures:  None   Microbiology  None   Antimicrobials: None     Objective: Vitals:   07/24/20 0323 07/24/20 0831 07/24/20 1050 07/24/20 1126  BP: (!) 126/91  122/75 [name])  105/94  Pulse: 61  74 69  Resp: 14  18 17   Temp: 97.9 F (36.6 C)  97.9 F (36.6 C) 97.9 F (36.6 C)  TempSrc: Axillary  Oral Oral  SpO2: 98% 96% 96% 97%  Weight:      Height:        Intake/Output Summary (Last 24 hours) at 07/24/2020 1337 Last data filed at 07/24/2020 0900 Gross per 24 hour  Intake 320 ml  Output 600 ml  Net -280 ml   Filed Weights   07/21/20 2316  Weight: 86 kg    Examination:  Constitutional: NAD Eyes: no scleral icterus ENMT: Mucous membranes are moist.  Neck: normal, supple Respiratory: clear to auscultation bilaterally, no wheezing, no crackles. Normal respiratory effort. No  accessory muscle use.  Cardiovascular: Regular rate and rhythm, no murmurs / rubs / gallops.  Abdomen: non distended, no tenderness. Bowel sounds positive.  Musculoskeletal: no clubbing / cyanosis.  Skin: no rashes Neurologic: non focal   Data Reviewed: I have independently reviewed following labs and imaging studies   CBC: Recent Labs  Lab 07/18/20 1844 07/21/20 2124 07/21/20 2133 07/22/20 0754  WBC 5.9 5.9  --  5.0  NEUTROABS  --  3.5  --   --   HGB 11.8* 11.0* 11.9* 11.4*  HCT 37.7* 35.0* 35.0* 36.0*  MCV 87.9 86.8  --  85.9  PLT 171 151  --  152   Basic Metabolic Panel: Recent Labs  Lab 07/18/20 1844 07/21/20 2124 07/21/20 2133 07/22/20 0754 07/24/20 0155  NA 139 140 143 141 138  K 4.7 4.1 4.0 4.0 4.0  CL 109 109 111 110 110  CO2 22 22  --  21* 22  GLUCOSE 133* 88 83 97 167*  BUN 14 18 20 15 16   CREATININE 1.33* 1.30* 1.20 1.15 1.55*  CALCIUM 9.2 9.6  --  9.2 9.4  MG  --   --   --   --  2.1   Liver Function Tests: Recent Labs  Lab 07/21/20 2124  AST 23  ALT 30  ALKPHOS 54  BILITOT 0.3  PROT 6.6  ALBUMIN 4.0   Coagulation Profile: Recent Labs  Lab 07/21/20 2124  INR 1.1   HbA1C: No results for input(s): HGBA1C in the last 72 hours. CBG: Recent Labs  Lab 07/23/20 1338 07/23/20 1550 07/23/20 2059 07/24/20 0619 07/24/20 1131  GLUCAP 162* 143* 209* 159* 160*    Recent Results (from the past 240 hour(s))  SARS Coronavirus 2 by RT PCR (hospital order, performed in [facility] hospital lab) Nasopharyngeal Nasopharyngeal Swab     Status: None   Collection Time: 07/21/20  9:45 PM   Specimen: Nasopharyngeal Swab  Result Value Ref Range Status   SARS Coronavirus 2 NEGATIVE NEGATIVE Final    Comment: (NOTE) SARS-CoV-2 target nucleic acids are NOT DETECTED.  The SARS-CoV-2 RNA is generally detectable in upper and lower respiratory specimens during the acute phase of infection. The lowest concentration of SARS-CoV-2 viral copies this assay can  detect is 250 copies / mL. A negative result does not preclude SARS-CoV-2 infection and should not be used as the sole basis for treatment or other patient management decisions.  A negative result may occur with improper specimen collection / handling, submission of specimen other than nasopharyngeal swab, presence of viral mutation(s) within the areas targeted by this assay, and inadequate number of viral copies (<250 copies / mL). A negative result must be combined with clinical observations, patient history, and  epidemiological information.  Fact Sheet for Patients:   BoilerBrush.com.cy  Fact Sheet for Healthcare Providers: https://pope.com/  This test is not yet approved or  cleared by the [place] FDA and has been authorized for detection and/or diagnosis of SARS-CoV-2 by FDA under an Emergency Use Authorization (EUA).  This EUA will remain in effect (meaning this test can be used) for the duration of the COVID-19 declaration under Section 564(b)(1) of the Act, 21 U.S.C. section 360bbb-3(b)(1), unless the authorization is terminated or revoked sooner.  Performed at [facility], [address] [place], [place] [address]      Radiology Studies: No results found.  Pamella Pert, MD, PhD [facility]  Between 7 am - 7 pm I am available, please contact me via Amion or Securechat  Between 7 pm - 7 am I am not available, please contact night coverage MD/APP via Amion

## 2020-07-24 NOTE — Progress Notes (Signed)
Pt. c/o numbness in RUE that just recently started. Pt. pushed continuous EEG button pushed by pt. Notified Dr. Amada Jupiter. MD called back and stated that message was noted. Will continue to monitor.

## 2020-07-24 NOTE — Plan of Care (Signed)

## 2020-07-24 NOTE — Progress Notes (Signed)
Subjective: No further seizure-like episodes overnight. Reports his right-sided paresthesias are improving but not back to baseline yet. Also continues to endorse slight weakness on the right side. States migraine cocktail "knocked him out" yesterday but he does not have headache anymore. Also reports that he had tightening in his right middle and ring finger briefly which has since resolved. States this is happened before but unable to describe any clear aggravating factors.  ROS: negative except above  Examination  Vital signs in last 24 hours: Temp:  [97.8 F (36.6 C)-98.3 F (36.8 C)] 97.9 F (36.6 C) (08/18 1050) Pulse Rate:  [56-81] 74 (08/18 1050) Resp:  [14-18] 18 (08/18 1050) BP: (107-126)/(74-91) 122/75 (08/18 1050) SpO2:  [93 %-98 %] 96 % (08/18 1050)  General: lying in bed,not in apparent distress CVS: pulse-normal rate and rhythm RS: breathing comfortably,CTA B Extremities: normal,warm Neuro:AOx3, cranial nerves II to XII grossly intact, 4/5 in right upper extremity however strongly suggestive of lack of effort from patient, 4/5 in right lower extremity, 5/5 in left upper and lower extremity, reduced touch in right upper and lower extremity as well as right face  Basic Metabolic Panel: Recent Labs  Lab 07/18/20 1844 07/18/20 1844 07/21/20 04/05/2123 07/21/20 2132-04-04 07/22/20 0754 07/24/20 0155  NA 139  --  140 143 141 138  K 4.7  --  4.1 4.0 4.0 4.0  CL 109  --  109 111 110 110  CO2 22  --  22  --  21* 22  GLUCOSE 133*  --  88 83 97 167*  BUN 14  --  18 20 15 16   CREATININE 1.33*  --  1.30* 1.20 1.15 1.55*  CALCIUM 9.2   < > 9.6  --  9.2 9.4  MG  --   --   --   --   --  2.1   < > = values in this interval not displayed.    CBC: Recent Labs  Lab 07/18/20 1844 07/21/20 04/05/2123 07/21/20 04-04-2132 07/22/20 0754  WBC 5.9 5.9  --  5.0  NEUTROABS  --  3.5  --   --   HGB 11.8* 11.0* 11.9* 11.4*  HCT 37.7* 35.0* 35.0* 36.0*  MCV 87.9 86.8  --  85.9  PLT 171 151  --  152      Coagulation Studies: Recent Labs    07/21/20 2123-04-05  LABPROT 14.2  INR 1.1    Imaging No new brain imaging overnight   ASSESSMENT AND PLAN: 59 year old male with history of chronic headache and seizure-like episodes who presented with further seizure-like episode.  Migraine with aura Pseudoseizure/ nonepileptic spells -Routine EEG captured 2 events during which patient was noted of left side upper extremity jerking and followed by whole body twitching, not responding to questions. No EEG change was seen during these events and these were nonepileptic. Another event was recorded during LTM EEG after discontinuing Keppra and zonisamide on 07/22/2020 during which patient was noted to be vigorously shaking his head from side to side, opening closing his eyes followed by slapping himself but lasted for about 20 seconds without concomitant EEG change and was another nonepileptic event.  Recommendations - Continue LTM EEG to assess for potential epileptogenicity - We will continue to hold antiepileptics (Keppra and zonisamide) -If continues to have normal EEG tomorrow, will likely discharge -Plan to resume Keppra 500 mg twice daily at the time of discharge and continue zonisamide at current dose as it also helps with migraine.  -I will also  start Imitrex 100 mg as needed for migraines -Seizure precautions - Management of rest of home meds per primary team   I have spent a total of20minuteswith the patient reviewing hospitalnotes, test results, labs and examining the patient as well as establishing an assessment and plan that was discussed personally with the patient.>50% of time was spent in direct patient care.  Lindie Spruce Epilepsy [facility] For questions after 5pm please refer to AMION to reach the Neurologist on call

## 2020-07-24 NOTE — Procedures (Addendum)
Patient Name:Nicholas [name]:[id number] Epilepsy Attending:Jojo Pehl Annabelle Harman Referring Physician/Provider:Dr. Lindie Spruce Duration: 07/23/2020 1451 to 07/24/2020 1451  Patient history:59 year old male with past medical history of bipolar disorder and epilepsy who presented with right-sided weakness post seizure-like episode. EEG to evaluate for seizures.  Level of alertness:Awake, asleep  AEDs during EEG study: gabapentin  Technical aspects: This EEG study was done with scalp electrodes positioned according to the 10-20 International system of electrode placement. Electrical activity was acquired at a sampling rate of 500Hz  and reviewed with a high frequency filter of 70Hz  and a low frequency filter of 1Hz . EEG data were recorded continuously and digitally stored.   Description: The posterior dominant rhythm consists of8-9Hz  activity of moderate voltage (25-35 uV) seen predominantly in posterior head regions, symmetric and reactive to eye opening and eye closing. Sleep was characterized by vertex waves, sleep spindles (12 to 14 Hz), maximal frontocentral region.    IMPRESSION: This study is within normal limits. No seizures or epileptiform discharges were seen throughout the recording.  Ishitha Roper 

## 2020-07-25 LAB — GLUCOSE, CAPILLARY
Glucose-Capillary: 140 mg/dL — ABNORMAL HIGH (ref 70–99)
Glucose-Capillary: 135 mg/dL — ABNORMAL HIGH (ref 70–99)

## 2020-07-25 MED ORDER — SUMATRIPTAN SUCCINATE 100 MG PO TABS: 100.0000 mg | ORAL_TABLET | Freq: Every day | ORAL | 0 refills | Status: DC | PRN

## 2020-07-25 MED ORDER — LEVETIRACETAM 250 MG PO TABS: 750.0000 mg | ORAL_TABLET | Freq: Two times a day (BID) | ORAL | 1 refills | Status: DC

## 2020-07-25 NOTE — Progress Notes (Signed)
Pt's IV removed; site is clean, dry and intact. Discharge instructions reviewed with pt.; voiced understanding. Personal belongings were packed by pt and transported with pt. Pt. stated that his wallet and keys are missing. Security was called and stated they do not having any belongings down there for pt. Pt. tried to call hotel where he is staying but staff told him they were not allowed to go in his room if he is not present. Pt. stated he would look around after he got to his place and let staff know. Pt. transported via wheelchair by staff to cab awaiting outside.

## 2020-07-25 NOTE — Evaluation (Addendum)
I agree with the following treatment note after review of the documentation. This session was performed under the supervision of a licensed clinician.   Gladys Damme, PT, DPT  Acute Rehabilitation Services  Pager: [phone]  Physical Therapy Evaluation Patient Details Name: Nicholas [name] MRN: [id number] DOB: 1961/06/08 Today's Date: 07/25/2020   History of Present Illness  Pt is a 59 yo male who presents to the ED with seizure like activity. Pt was noted to have mulitple seizures in hospital care. EEG unremarkable for epilepsy and deemed as pseudoseizures with migraines. Pt has PMH of anemia, T2DM, HLD, HTN, low back pain, depression and asthma  Clinical Impression  Pt was evaluated and assessed today for the above diagnosis and impairments below. Pt required supervision to min assist during transfers, gait, and stair navigation. Pt had decreased sensation and coordination on R side. Pt lives alone in an apartment with 10 stairs to the entry. Recommend HHPT to address current deficits. Pt would continue to benefit from acute therapy in order to increase his independence with functional tasks, decrease knee pain, and to address deficits RLE. Will continue to follow acutely.     Follow Up Recommendations Home health PT;Supervision for mobility/OOB    Equipment Recommendations  Rolling walker with 5" wheels    Recommendations for Other Services       Precautions / Restrictions Precautions Precautions: Fall Restrictions Weight Bearing Restrictions: No      Mobility  Bed Mobility Overal bed mobility: Modified Independent           Transfers Overall transfer level: Needs assistance Equipment used: Rolling walker (2 wheeled) Transfers: Sit to/from Stand Sit to Stand: Min assist         General transfer comment: pt required min assist for lift off asssit with RW for sit<>stand transfer. Pt required increase time with power up to  standing  Ambulation/Gait Ambulation/Gait assistance: Min guard Gait Distance (Feet): 200 Feet Assistive device: Rolling walker (2 wheeled) Gait Pattern/deviations: Step-through pattern;Decreased step length - right;Decreased stance time - right;Decreased stride length;Decreased dorsiflexion - right;Decreased dorsiflexion - left Gait velocity: decreased   General Gait Details: initially very slow gait with RW but was noted to have step to gait pattern. Pt was able to transition to step through at end of session. Pt was cued for dorsiflexion with initial contact in gait. Pt required min guard assist with RW for ambulation for safety.  Stairs Stairs: Yes Stairs assistance: Min guard Stair Management: One rail Right;One rail Left;Step to pattern Number of Stairs: 10 General stair comments: pt required min guard assist with stair navigation while alternating use of R/L railings. Pt utilizes a step to pattern and was noted to ascend with lead leg on L, descend with lead leg on R mostly due to R knee pain  Wheelchair Mobility    Modified Rankin (Stroke Patients Only)       Balance Overall balance assessment: Needs assistance Sitting-balance support: Feet supported;No upper extremity supported Sitting balance-Leahy Scale: Good     Standing balance support: During functional activity;No upper extremity supported;Bilateral upper extremity supported Standing balance-Leahy Scale: Fair Standing balance comment: static stand without UE support, RW for amb              Pertinent Vitals/Pain Pain Assessment: 0-10 Pain Score: 8  Pain Location: low back and R knee Pain Descriptors / Indicators: Discomfort;Burning;Tingling Pain Intervention(s): Limited activity within patient's tolerance;Monitored during session    Home Living Family/patient expects to be discharged to:: Private  residence Living Arrangements: Alone   Type of Home: Apartment Home Access: Stairs to enter Entrance  Stairs-Rails:  (has rail, but unsure of what side) Entrance Stairs-Number of Steps: 10 Home Layout: One level Home Equipment: None Additional Comments: pt stated that he is moving into a new apt that he hasn't been in yet. Pt has a RW but it is in storage and won't have access to it. Pt also states that he doesn't know which side the railings are on for the stairs    Prior Function Level of Independence: Independent with assistive device(s)         Comments: utilizes a RW for home and community ambulation     Hand Dominance   Dominant Hand: Right    Extremity/Trunk Assessment   Upper Extremity Assessment Upper Extremity Assessment: Overall WFL for tasks assessed;RUE deficits/detail    Lower Extremity Assessment Lower Extremity Assessment: RLE deficits/detail RLE Deficits / Details: R knee pain. Gross LE 4/5 MMT RLE Sensation: decreased light touch RLE Coordination: decreased fine motor;decreased gross motor    Cervical / Trunk Assessment Cervical / Trunk Assessment: Normal  Communication   Communication: No difficulties  Cognition Arousal/Alertness: Awake/alert Behavior During Therapy: WFL for tasks assessed/performed Overall Cognitive Status: Within Functional Limits for tasks assessed                General Comments      Exercises     Assessment/Plan    PT Assessment Patient needs continued PT services  PT Problem List Decreased strength;Decreased range of motion;Decreased activity tolerance;Decreased balance;Decreased mobility;Decreased coordination;Pain       PT Treatment Interventions Gait training;Therapeutic exercise;Patient/family education;Balance training;Functional mobility training;Stair training;DME instruction;Therapeutic activities;Cognitive remediation;Neuromuscular re-education;Modalities    PT Goals (Current goals can be found in the Care Plan section)  Acute Rehab PT Goals Patient Stated Goal: get to apartment soon PT Goal Formulation:  With patient Time For Goal Achievement: 08/08/20 Potential to Achieve Goals: Good    Frequency Min 3X/week   Barriers to discharge Decreased caregiver support pt lives alone    Co-evaluation               AM-PAC PT "6 Clicks" Mobility  Outcome Measure Help needed turning from your back to your side while in a flat bed without using bedrails?: None Help needed moving from lying on your back to sitting on the side of a flat bed without using bedrails?: None Help needed moving to and from a bed to a chair (including a wheelchair)?: A Little Help needed standing up from a chair using your arms (e.g., wheelchair or bedside chair)?: A Little Help needed to walk in hospital room?: A Little Help needed climbing 3-5 steps with a railing? : A Little 6 Click Score: 20    End of Session Equipment Utilized During Treatment: Gait belt Activity Tolerance: Patient tolerated treatment well Patient left: in chair;with call bell/phone within reach;with chair alarm set Nurse Communication: Mobility status PT Visit Diagnosis: Unsteadiness on feet (R26.81);Pain;Muscle weakness (generalized) (M62.81);History of falling (Z91.81);Difficulty in walking, not elsewhere classified (R26.2) Pain - Right/Left: Right Pain - part of body: Knee (back)    Time: 9563-8756 PT Time Calculation (min) (ACUTE ONLY): 33 min   Charges:   PT Evaluation $PT Eval Low Complexity: 1 Low PT Treatments $Gait Training: 8-22 mins       Harmon Pier, SPT  Acute Rehabilitation Services  Office: [phone]  07/25/2020, 3:49 PM

## 2020-07-25 NOTE — Progress Notes (Signed)
Subjective: Had another episode of right upper and lower extremity numbness briefly yesterday.  States he has intermittently had headache as well.  Denies any new concerns.  ROS: negative except above  Examination  Vital signs in last 24 hours: Temp:  [98 F (36.7 C)-98.5 F (36.9 C)] 98 F (36.7 C) (08/19 0812) Pulse Rate:  [62-83] 67 (08/19 1129) Resp:  [12-21] 16 (08/19 1129) BP: (119-131)/(81-91) 129/91 (08/19 1129) SpO2:  [95 %-97 %] 97 % (08/19 1129)  General: lying in bed,not in apparent distress CVS: pulse-normal rate and rhythm RS: breathing comfortably,CTA B Extremities: normal,warm Neuro:AOx3, cranial nerves II to XII grossly intact,4/5 in right upper extremity however strongly suggestive of lack of effort from patient,4/5in right lower extremity,5/5in left upper and lower extremity,reduced touch in right upper and lower extremity as well as right face  Basic Metabolic Panel: Recent Labs  Lab 07/18/20 1844 07/18/20 1844 07/21/20 2124 07/21/20 2133 07/22/20 0754 07/24/20 0155  NA 139  --  140 143 141 138  K 4.7  --  4.1 4.0 4.0 4.0  CL 109  --  109 111 110 110  CO2 22  --  22  --  21* 22  GLUCOSE 133*  --  88 83 97 167*  BUN 14  --  18 20 15 16   CREATININE 1.33*  --  1.30* 1.20 1.15 1.55*  CALCIUM 9.2   < > 9.6  --  9.2 9.4  MG  --   --   --   --   --  2.1   < > = values in this interval not displayed.    CBC: Recent Labs  Lab 07/18/20 1844 07/21/20 2124 07/21/20 2133 07/22/20 0754  WBC 5.9 5.9  --  5.0  NEUTROABS  --  3.5  --   --   HGB 11.8* 11.0* 11.9* 11.4*  HCT 37.7* 35.0* 35.0* 36.0*  MCV 87.9 86.8  --  85.9  PLT 171 151  --  152     Coagulation Studies: No results for input(s): LABPROT, INR in the last 72 hours.  Imaging No new brain imaging overnight  ASSESSMENT AND PLAN:59 year old male with history of chronic headache and seizure-like episodes who presented with further seizure-like episode.  Migraine with  aura Pseudoseizure/nonepileptic spells -Routine EEG captured 2 events during which patient was noted of left side upper extremity jerking and followed by whole body twitching, not responding to questions. No EEG change was seen during these events and these were nonepileptic. - Another event wasrecorded during LTM EEG after discontinuing Keppra and zonisamideon8/16/2021 during which patient was noted to be vigorously shaking his head from side to side, opening closing his eyes followed by slapping himself but lasted for about 20 seconds without concomitant EEG change and was another nonepileptic event.  -Another event was noted on 07/24/2020 during which patient reported right-sided numbness without concomitant EEG change and was another nonepileptic event.  Recommendations -DisontinueLTM EEG as we have captured multiple events consistent with nonepileptic spells. -Resume zonisamide at home dose.   - Will slowly wean off Keppra.  Patient is currently on 1000 mg twice daily.  Recommend reducing to 750 mg twice daily for a month followed by 500 mg twice daily for a month followed by 250 mg daily for a month then stop -I will also start Imitrex 100 mg as needed for migraines -Seizure precautions including do not drive discussed with patient Plan recommend follow-up with neurology in 8 to 12 weeks - Management of  rest of comorbidities per primary team   I have spent a total of34minuteswith the patient reviewing hospitalnotes, test results, labs and examining the patient as well as establishing an assessment and plan that was discussed personally with the patient and Dr. Lafe Garin.>50% of time was spent in direct patient care.  Lindie Spruce Epilepsy [facility] For questions after 5pm please refer to AMION to reach the Neurologist on call

## 2020-07-25 NOTE — Progress Notes (Signed)
LTM EEG discontinued - no skin breakdown at unhook.   

## 2020-07-25 NOTE — TOC Transition Note (Signed)
Transition of Care [facility]) - CM/SW Discharge Note   Patient Details  Name: MACLOVIO HENSON MRN: [id number] Date of Birth: 05-01-1961  [facility] Care [facility]) CM/SW Contact:  Geralynn Ochs, LCSW Phone Number: 07/25/2020, 2:36 PM   Clinical Narrative:   CSW met with patient to discuss discharge home and home health services. Patient already established with [facility], will need orders for PT and OT. CSW alerted [facility] of patient discharge home. Patient says he needs RW, CSW placed order with [facility]. Patient asked about aide support, and patient will have to go through PCP. Patient will need cab voucher to get home.    Final next level of care: [place] Barriers to Discharge: Barriers Resolved   Patient Goals and CMS Choice Patient states their goals for this hospitalization and ongoing recovery are:: to get a new apartment and aide support CMS Medicare.gov Compare Post Acute Care list provided to:: Patient Choice offered to / list presented to : Patient  Discharge Placement                Patient to be transferred to facility by: Cab Name of family member notified: Self Patient and family notified of of transfer: 07/25/20  Discharge Plan and Services   Discharge Planning Services: CM Consult            DME Arranged: [place] Rile rolling DME Agency: [facility]       HH Arranged: PT, OT [place] Agency: [facility] ([facility])        Social Determinants of Health (SDOH) Interventions     Readmission Risk Interventions No flowsheet data found.

## 2020-07-25 NOTE — Procedures (Signed)
Patient Name:Nicholas [name] JJK:[id number] Epilepsy Attending:Priyanka Annabelle Harman Referring Physician/Provider:Dr.Priyanka Melynda Ripple Duration:07/24/2020 [id number]  Patient history:59 year old male with past medical history of bipolar disorder and epilepsy who presented with right-sided weakness post seizure-like episode. EEG to evaluate for seizures.  Level of alertness:Awake, asleep  AEDs during EEG study:gabapentin  Technical aspects: This EEG study was done with scalp electrodes positioned according to the 10-20 International system of electrode placement. Electrical activity was acquired at a sampling rate of 500Hz  and reviewed with a high frequency filter of 70Hz  and a low frequency filter of 1Hz . EEG data were recorded continuously and digitally stored.   Description: The posterior dominant rhythm consists of8-9Hz  activity of moderate voltage (25-35 uV) seen predominantly in posterior head regions, symmetric and reactive to eye opening and eye closing. Sleep was characterized by vertex waves, sleep spindles (12 to 14 Hz), maximal frontocentral region.   IMPRESSION: This study is within normal limits. No seizures or epileptiform discharges were seen throughout the recording.  Priyanka 

## 2020-07-25 NOTE — Discharge Instructions (Signed)
Wean off keppra as following  750 mg twice daily for a month, then on 08/25/20 take 500 mg twice daily for a month then 09/24/20 take 250 mg twice daily for a month then stop  Follow with Primary MD in 5-7 days Follow up with your neurologist in 4 weeks  Please get a complete blood count and chemistry panel checked by your Primary MD at your next visit, and again as instructed by your Primary MD. Please get your medications reviewed and adjusted by your Primary MD.  Please request your Primary MD to go over all Hospital Tests and Procedure/Radiological results at the follow up, please get all Hospital records sent to your Prim MD by signing hospital release before you go home.  In some cases, there will be blood work, cultures and biopsy results pending at the time of your discharge. Please request that your primary care M.D. goes through all the records of your hospital data and follows up on these results.  If you had Pneumonia of Lung problems at the Hospital: Please get a 2 view Chest X ray done in 6-8 weeks after hospital discharge or sooner if instructed by your Primary MD.  If you have Congestive Heart Failure: Please call your Cardiologist or Primary MD anytime you have any of the following symptoms:  1) 3 pound weight gain in 24 hours or 5 pounds in 1 week  2) shortness of breath, with or without a dry hacking cough  3) swelling in the hands, feet or stomach  4) if you have to sleep on extra pillows at night in order to breathe  Follow cardiac low salt diet and 1.5 lit/day fluid restriction.  If you have diabetes Accuchecks 4 times/day, Once in AM empty stomach and then before each meal. Log in all results and show them to your primary doctor at your next visit. If any glucose reading is under 80 or above 300 call your primary MD immediately.  If you have Seizure/Convulsions/Epilepsy: Please do not drive, operate heavy machinery, participate in activities at heights or  participate in high speed sports until you have seen by Primary MD or a Neurologist and advised to do so again. Per [facility] statutes, patients with seizures are not allowed to drive until they have been seizure-free for six months.  Use caution when using heavy equipment or power tools. Avoid working on ladders or at heights. Take showers instead of baths. Ensure the water temperature is not too high on the home water heater. Do not go swimming alone. Do not lock yourself in a room alone (i.e. bathroom). When caring for infants or small children, sit down when holding, feeding, or changing them to minimize risk of injury to the child in the event you have a seizure. Maintain good sleep hygiene. Avoid alcohol.   If you had Gastrointestinal Bleeding: Please ask your Primary MD to check a complete blood count within one week of discharge or at your next visit. Your endoscopic/colonoscopic biopsies that are pending at the time of discharge, will also need to followed by your Primary MD.  Get Medicines reviewed and adjusted. Please take all your medications with you for your next visit with your Primary MD  Please request your Primary MD to go over all hospital tests and procedure/radiological results at the follow up, please ask your Primary MD to get all Hospital records sent to his/her office.  If you experience worsening of your admission symptoms, develop shortness of breath, life threatening  emergency, suicidal or homicidal thoughts you must seek medical attention immediately by calling 911 or calling your MD immediately  if symptoms less severe.  You must read complete instructions/literature along with all the possible adverse reactions/side effects for all the Medicines you take and that have been prescribed to you. Take any new Medicines after you have completely understood and accpet all the possible adverse reactions/side effects.   Do not drive or operate heavy machinery when  taking Pain medications.   Do not take more than prescribed Pain, Sleep and Anxiety Medications  Special Instructions: If you have smoked or chewed Tobacco  in the last 2 yrs please stop smoking, stop any regular Alcohol  and or any Recreational drug use.  Wear Seat belts while driving.  Please note You were cared for by a hospitalist during your hospital stay. If you have any questions about your discharge medications or the care you received while you were in the hospital after you are discharged, you can call the unit and asked to speak with the hospitalist on call if the hospitalist that took care of you is not available. Once you are discharged, your primary care physician will handle any further medical issues. Please note that NO REFILLS for any discharge medications will be authorized once you are discharged, as it is imperative that you return to your primary care physician (or establish a relationship with a primary care physician if you do not have one) for your aftercare needs so that they can reassess your need for medications and monitor your lab values.  You can reach the hospitalist office at phone [phone] or fax [phone]   If you do not have a primary care physician, you can call [phone] for a physician referral.  Activity: As tolerated with Full fall precautions use walker/cane & assistance as needed    Diet: regular  Disposition Home

## 2020-07-25 NOTE — Discharge Summary (Signed)
Physician Discharge Summary  Nicholas [name] WUJ:[id number] DOB: 12-04-1961 DOA: 07/21/2020  PCP: Patient, No Pcp Per  Admit date: 07/21/2020 Discharge date: 07/25/2020  Admitted From: home Disposition:  home  Recommendations for Outpatient Follow-up:  1. Follow up with PCP in 1-2 weeks 2. Follow up with neurology in a month  Home Health: PT, OT Equipment/Devices: walker  Discharge Condition: stable CODE STATUS: Full code Diet recommendation: regular  HPI: Per admitting MD, Nicholas [name] is a 59 y.o. male with history of seizures, asthma, hypertension, diabetes mellitus type 2, hyperlipidemia was brought to the ER after patient started having right-sided weakness.  Exact time of origin is unclear.  Patient did come to the ER 2 days ago with similar complaints and was discharged home.  Patient states he was having headache all through the day yesterday and started developing right-sided weakness.  EMS was called and patient was brought to the ER.  On the way patient was noticed to have a generalized tonic-clonic seizure lasting for around 45 seconds which was aborted by IV Versed 2.5 mg.  Hospital Course / Discharge diagnoses: Acute metabolic encephalopathy, rule out seizures-neurology consulted and followed patient while hospitalized.  He was placed on continuous EEG for 3 days, he has had several spells while hospitalized however EEG did not show any epileptiform discharges.  His last episode has been on 8/16, and has not had any further events since.  His EEG was done off of his antiepileptics.  Case was discussed with Dr. Melynda Ripple on the day of discharge, he is stable to go home, and she recommends patient to taper off his Keppra 700 mg twice daily for the next month then 500 twice daily for the next month then 200 twice daily for the next month then stop.  He is established neurology at [facility], recommend outpatient follow-up in a month.  He was also given Imitrex for  migraines   Active Problems DM2 -resume home medications Essential hypertension-resume home medications Chronic kidney disease stage II- baseline around 1.3, has gone as high as 1.7-1.9 earlier this year.  Close to baseline currently at 1.5 Depression-continue Zoloft, nortriptyline Asthma-no wheezing, this was stable, resume home medications Hyperlipidemia-continue statin   Discharge Instructions   Allergies as of 07/25/2020      Reactions   Levothyroxine Anaphylaxis, Cough   Chronic cough   Nsaids Other (See Comments)   D/t gastric ulcer   Amoxicillin Itching, Other (See Comments)   THRUSH Causes sores in mouth   Ampicillin Other (See Comments)   THRUSH   Penicillins Itching, Other (See Comments)   THRUSH Causes sores in mouth   Strawberry Extract Swelling   LIPS SWELL   Asa [aspirin] Other (See Comments)   Bleeding   Bactrim [sulfamethoxazole-trimethoprim] Hives, Itching, Other (See Comments)   GI upset   Depakote [divalproex Sodium]    Causes double vision and speech problems    Dilantin [phenytoin] Other (See Comments)   Severe skin peeling   Methocarbamol Other (See Comments)   dizziness   Risperidone And Related Other (See Comments)   Hallucinations   Sitagliptin Other (See Comments)   pancreatitis   Strawberry (diagnostic) Itching, Swelling   Sulfa Antibiotics Other (See Comments)   Affects thyroid   Tolmetin Other (See Comments)   D/t gastric ulcer   Ultram [tramadol Hcl] Other (See Comments)   D/t gastric ulcer   Oatmeal Hives, Other (See Comments)   White spots/sores in mouth      Medication  List    TAKE these medications   acetaminophen 650 MG CR tablet Commonly known as: Tylenol 8 Hour Take 1 tablet (650 mg total) by mouth every 8 (eight) hours.   albuterol 108 (90 Base) MCG/ACT inhaler Commonly known as: VENTOLIN HFA Inhale 1-2 puffs into the lungs every 6 (six) hours as needed for wheezing or shortness of breath.   atorvastatin 80 MG  tablet Commonly known as: LIPITOR Take 80 mg by mouth at bedtime.   budesonide-formoterol 80-4.5 MCG/ACT inhaler Commonly known as: SYMBICORT Inhale 2 puffs into the lungs daily.   clopidogrel 75 MG tablet Commonly known as: PLAVIX Take 1 tablet (75 mg total) by mouth daily.   ergocalciferol 1.25 MG (50000 UT) capsule Commonly known as: VITAMIN D2 Take 50,000 Units by mouth every Monday.   fenofibrate 145 MG tablet Commonly known as: TRICOR Take 145 mg by mouth daily.   fluticasone-salmeterol 115-21 MCG/ACT inhaler Commonly known as: ADVAIR HFA Inhale 2 puffs into the lungs daily.   gabapentin 300 MG capsule Commonly known as: NEURONTIN Take 2 capsules (600 mg total) by mouth 2 (two) times daily.   HumaLOG KwikPen 200 UNIT/ML KwikPen Generic drug: insulin lispro Inject 0-10 Units into the skin 3 (three) times daily with meals.   latanoprost 0.005 % ophthalmic solution Commonly known as: XALATAN Place 1 drop into both eyes at bedtime.   levETIRAcetam 250 MG tablet Commonly known as: Keppra Take 3 tablets (750 mg total) by mouth 2 (two) times daily. 750 mg twice daily for a month, then on 08/25/20 take 500 mg twice daily for a month then 09/24/20 take 250 mg twice daily for a month then stop What changed:   medication strength  how much to take  additional instructions   metFORMIN 1000 MG tablet Commonly known as: GLUCOPHAGE Take 1 tablet (1,000 mg total) by mouth 2 (two) times daily.   nortriptyline 50 MG capsule Commonly known as: PAMELOR Take 100 mg by mouth at bedtime.   pantoprazole 40 MG tablet Commonly known as: PROTONIX Take 40 mg by mouth 2 (two) times daily.   polyethylene glycol powder 17 GM/SCOOP powder Commonly known as: GLYCOLAX/MIRALAX Take 17 g by mouth daily as needed for mild constipation.   rizatriptan 10 MG tablet Commonly known as: MAXALT Take 10 mg by mouth daily. May repeat in 2 hours if needed   sertraline 50 MG tablet Commonly  known as: ZOLOFT Take 50 mg by mouth daily.   SUMAtriptan 100 MG tablet Commonly known as: IMITREX Take 1 tablet (100 mg total) by mouth daily as needed for migraine or headache. May repeat in 2 hours if headache persists or recurs.   timolol 0.5 % ophthalmic solution Commonly known as: TIMOPTIC Place 1 drop into both eyes 2 (two) times daily.   valsartan 320 MG tablet Commonly known as: DIOVAN Take 320 mg by mouth daily.   zonisamide 100 MG capsule Commonly known as: ZONEGRAN Take 200 mg by mouth 2 (two) times daily.            Durable Medical Equipment  (From admission, onward)         Start     Ordered   07/25/20 1419  For home use only DME Walker rolling  Once       Question Answer Comment  Walker: With 5 Inch Wheels   Patient needs a walker to treat with the following condition Weakness      07/25/20 1419  Follow-up Information    Iven Finn., MD. Schedule an appointment as soon as possible for a visit in 4 week(s).   Specialty: Neurology Contact information: [address] [place] [address]               Consultations:  Neurology   Procedures/Studies:  EEG  Result Date: 07/22/2020 Charlsie Quest, MD     07/23/2020  9:39 AM Patient Name: Nicholas [name] MRN: [id number] Epilepsy Attending: Charlsie Quest Referring Physician/Provider: Dr. Caryl Pina Date: 07/22/2020 Duration: 23.55 mins Patient history: 59 year old male with past medical history of bipolar disorder and epilepsy who presented with right-sided weakness post seizure-like episode.  EEG to evaluate for seizures. Level of alertness: Awake AEDs during EEG study: Keppra, zonisamide, gabapentin Technical aspects: This EEG study was done with scalp electrodes positioned according to the 10-20 International system of electrode placement. Electrical activity was acquired at a sampling rate of  and reviewed with a high frequency filter of  and a low  frequency filter of . EEG data were recorded continuously and digitally stored. Description: The posterior dominant rhythm consists of 8-9 Hz activity of moderate voltage (25-35 uV) seen predominantly in posterior head regions, symmetric and reactive to eye opening and eye closing. Sleep was characterized by vertex waves, sleep spindles (12 to 14 Hz), maximal frontocentral region.  During photic stimulation at 3 Hz, patient was noted to have left upper extremity nonrhythmic fast tremor-like activity which gradually progressed to whole body shaking. Patient was initially laying with eyes closed and not responding and later was noted to have teeth chattering and side-to-side movement of the mandible.  At the end of the seizure-like activity, patient opened his eyes, was looking straight and not following commands.  EEG tech calpped next to patient's ears and patient started looking at EEG tech but still not answering questions or following commands.  When photic summation was repeated, patient had similar episode again.  Concomitant EEG before, during and after the event did not show any EEG to suggest seizure.    Hyperventilation was not performed.   IMPRESSION: This study is within normal limits. No seizures or epileptiform discharges were seen throughout the recording. During photic stimulation patient was noted to have nonrhythmic left arm twitching followed by whole body twitching without concomitant EEG change.  This was a nonepileptic event. Charlsie Quest   EEG  Result Date: 07/04/2020 Charlsie Quest, MD     07/04/2020  5:12 PM Patient Name: Nicholas [name] MRN: [id number] Epilepsy Attending: Charlsie Quest Referring Physician/Provider: Dr. Caryl Pina Date: 07/04/2020 Duration: 24.02 mins Patient history: 59 year old male with history of seizures who presented with acute onset of left-sided weakness.  On examination patient was noted to have diffuse limb jerking with a blank stare.  EEG evaluate  for seizures. Level of alertness: Awake, asleep AEDs during EEG study: None Technical aspects: This EEG study was done with scalp electrodes positioned according to the 10-20 International system of electrode placement. Electrical activity was acquired at a sampling rate of  and reviewed with a high frequency filter of  and a low frequency filter of . EEG data were recorded continuously and digitally stored. Description: The posterior dominant rhythm consists of 8 Hz activity of moderate voltage (25-35 uV) seen predominantly in posterior head regions, symmetric and reactive to eye opening and eye closing. Sleep was characterized by vertex waves, sleep spindles (12 to 14 Hz), maximal frontocentral region.  Hyperventilation and photic stimulation were not performed.   IMPRESSION: This study is within normal limits. No seizures or epileptiform discharges were seen throughout the recording. Charlsie Quest   CT Code Stroke CTA Head W/WO contrast  Result Date: 07/04/2020 CLINICAL DATA:  Code stroke EXAM: CT HEAD WITHOUT CONTRAST CT ANGIOGRAPHY OF THE HEAD AND NECK TECHNIQUE: Contiguous axial images were obtained from the base of the skull through the vertex without intravenous contrast. Multidetector CT imaging of the head and neck was performed using the standard protocol during bolus administration of intravenous contrast. Multiplanar CT image reconstructions and MIPs were obtained to evaluate the vascular anatomy. Carotid stenosis measurements (when applicable) are obtained utilizing NASCET criteria, using the distal internal carotid diameter as the denominator. CONTRAST:  100 mL Omnipaque 350 COMPARISON:  05/03/2020 FINDINGS: CT HEAD Brain: There is no acute intracranial hemorrhage, mass effect, or edema. Gray-white differentiation is preserved. There is no extra-axial fluid collection. Ventricles and sulci are within normal limits in size and configuration. Vascular: No hyperdense vessel or  unexpected calcification. Skull: Calvarium is unremarkable. Sinuses/Orbits: No acute finding. Other: None. Review of the MIP images confirms the above findings CTA NECK Aortic arch: Great vessel origins are patent. There is direct origin of the left vertebral artery from the arch. Right carotid system: Patent. No measurable stenosis or evidence of dissection. Left carotid system: Patent. No measurable stenosis or evidence of dissection. Vertebral arteries: Patent.  Right vertebral artery is dominant. Skeleton: Postoperative changes of anterior fusion at C4-C7. Degenerative changes of the cervical spine. Other neck: No mass or adenopathy. Upper chest: No apical lung mass. Review of the MIP images confirms the above findings CTA HEAD Anterior circulation: Intracranial internal carotid arteries are patent. Anterior and middle cerebral arteries are patent. Posterior circulation: Intracranial vertebral arteries, basilar artery, and posterior cerebral arteries are patent. A right posterior communicating artery is present. Venous sinuses: Patent as allowed by contrast bolus timing. Review of the MIP images confirms the above findings IMPRESSION: No acute intracranial hemorrhage or evidence of acute infarction. ASPECT score is 10. No large vessel occlusion or hemodynamically significant stenosis. These results were communicated to Dr. Otelia Limes at 12:24 pmon 7/29/2021by text page via the [facility] messaging system. Electronically Signed   By: Guadlupe Spanish M.D.   On: 07/04/2020 12:33   CT Code Stroke CTA Neck W/WO contrast  Result Date: 07/04/2020 CLINICAL DATA:  Code stroke EXAM: CT HEAD WITHOUT CONTRAST CT ANGIOGRAPHY OF THE HEAD AND NECK TECHNIQUE: Contiguous axial images were obtained from the base of the skull through the vertex without intravenous contrast. Multidetector CT imaging of the head and neck was performed using the standard protocol during bolus administration of intravenous contrast. Multiplanar CT image  reconstructions and MIPs were obtained to evaluate the vascular anatomy. Carotid stenosis measurements (when applicable) are obtained utilizing NASCET criteria, using the distal internal carotid diameter as the denominator. CONTRAST:  100 mL Omnipaque 350 COMPARISON:  05/03/2020 FINDINGS: CT HEAD Brain: There is no acute intracranial hemorrhage, mass effect, or edema. Gray-white differentiation is preserved. There is no extra-axial fluid collection. Ventricles and sulci are within normal limits in size and configuration. Vascular: No hyperdense vessel or unexpected calcification. Skull: Calvarium is unremarkable. Sinuses/Orbits: No acute finding. Other: None. Review of the MIP images confirms the above findings CTA NECK Aortic arch: Great vessel origins are patent. There is direct origin of the left vertebral artery from the arch. Right carotid system: Patent. No measurable stenosis or evidence of dissection. Left  carotid system: Patent. No measurable stenosis or evidence of dissection. Vertebral arteries: Patent.  Right vertebral artery is dominant. Skeleton: Postoperative changes of anterior fusion at C4-C7. Degenerative changes of the cervical spine. Other neck: No mass or adenopathy. Upper chest: No apical lung mass. Review of the MIP images confirms the above findings CTA HEAD Anterior circulation: Intracranial internal carotid arteries are patent. Anterior and middle cerebral arteries are patent. Posterior circulation: Intracranial vertebral arteries, basilar artery, and posterior cerebral arteries are patent. A right posterior communicating artery is present. Venous sinuses: Patent as allowed by contrast bolus timing. Review of the MIP images confirms the above findings IMPRESSION: No acute intracranial hemorrhage or evidence of acute infarction. ASPECT score is 10. No large vessel occlusion or hemodynamically significant stenosis. These results were communicated to Dr. Otelia Limes at 12:24 pmon 7/29/2021by text  page via the [facility] messaging system. Electronically Signed   By: Guadlupe Spanish M.D.   On: 07/04/2020 12:33   MR BRAIN WO CONTRAST  Result Date: 07/22/2020 CLINICAL DATA:  Follow-up examination for acute stroke. EXAM: MRI HEAD WITHOUT CONTRAST TECHNIQUE: Multiplanar, multiecho pulse sequences of the brain and surrounding structures were obtained without intravenous contrast. COMPARISON:  Prior CTs from 07/21/2020. FINDINGS: Brain: Cerebral volume within normal limits for patient age. No focal parenchymal signal abnormality identified. No abnormal foci of restricted diffusion to suggest acute or subacute ischemia. Gray-white matter differentiation well maintained. No encephalomalacia to suggest chronic infarction. No foci of susceptibility artifact to suggest acute or chronic intracranial hemorrhage. No mass lesion, midline shift or mass effect. No hydrocephalus. No extra-axial fluid collection. Major dural sinuses are grossly patent. Pituitary gland and suprasellar region are normal. Midline structures intact and normal. Vascular: Major intracranial vascular flow voids well maintained and normal in appearance. Skull and upper cervical spine: Craniocervical junction within normal limits. Postoperative changes partially visualized within the upper cervical spine. No focal marrow replacing lesion. Scalp soft tissues unremarkable. Sinuses/Orbits: Globes and orbital soft tissues within normal limits. Paranasal sinuses are clear. No mastoid effusion. Inner ear structures normal. Other: None. IMPRESSION: Stable negative brain MRI. No acute intracranial abnormality identified. Electronically Signed   By: Rise Mu M.D.   On: 07/22/2020 03:19   MR BRAIN WO CONTRAST  Result Date: 07/04/2020 CLINICAL DATA:  Neuro deficit, acute, stroke suspected. Additional history obtained from electronic MEDICAL RECORD NUMBERSyncope, patient found to have slurred speech and left-sided weakness, patient with seizure history.  EXAM: MRI HEAD WITHOUT CONTRAST TECHNIQUE: Multiplanar, multiecho pulse sequences of the brain and surrounding structures were obtained without intravenous contrast. COMPARISON:  Noncontrast head CT and CT angiogram head/neck performed earlier the same day, brain MRI 09/16/2018 FINDINGS: Brain: Cerebral volume is normal for age. No focal parenchymal signal abnormality is identified. There is no acute infarct. No evidence of intracranial mass. No chronic intracranial blood products. No extra-axial fluid collection. No midline shift. Vascular: Expected proximal arterial flow voids. Skull and upper cervical spine: There redemonstrated nonspecific heterogeneous marrow of the visualized upper cervical spine. No focal suspicious marrow lesion. Sinuses/Orbits: Visualized orbits show no acute finding. Trace ethmoid sinus mucosal thickening. No significant mastoid effusion IMPRESSION: No evidence of acute intracranial abnormality, including acute infarction. Redemonstrated nonspecific heterogeneous marrow signal within the visualized upper cervical spine without focal suspicious osseous lesion. Minimal ethmoid sinus mucosal thickening. Electronically Signed   By: Jackey Loge DO   On: 07/04/2020 13:17   Overnight EEG with video  Result Date: 07/23/2020 Charlsie Quest, MD     07/24/2020  10:00 AM Patient Name: Nicholas [name] MRN: [id number] Epilepsy Attending: Charlsie Quest Referring Physician/Provider: Dr. Lindie Spruce Duration:  07/22/2020 1451 to 07/23/2020 1451  Patient history: 58 year old male with past medical history of bipolar disorder and epilepsy who presented with right-sided weakness post seizure-like episode.  EEG to evaluate for seizures.  Level of alertness: Awake, asleep  AEDs during EEG study:  gabapentin  Technical aspects: This EEG study was done with scalp electrodes positioned according to the 10-20 International system of electrode placement. Electrical activity was acquired at a sampling  rate of 500Hz  and reviewed with a high frequency filter of 70Hz  and a low frequency filter of 1Hz . EEG data were recorded continuously and digitally stored.  Description: The posterior dominant rhythm consists of 8-9 Hz activity of moderate voltage (25-35 uV) seen predominantly in posterior head regions, symmetric and reactive to eye opening and eye closing. Sleep was characterized by vertex waves, sleep spindles (12 to 14 Hz), maximal frontocentral region. On 07/22/2020 at 1708 during which patient was noted to be vigorously shaking his head from side to side, opening closing his eyes followed by slapping himself which lasted for about 20 seconds.  Concomitant EEG before, during and after the event did not show any EEG change to suggest seizure.  IMPRESSION: This study is within normal limits. No seizures or epileptiform discharges were seen throughout the recording. Patient was noted to have another event on 8/16/121 at 1708 as described above without concomitant EEG change and was a nonepileptic event.  Charlsie Quest   CT HEAD CODE STROKE WO CONTRAST  Result Date: 07/21/2020 CLINICAL DATA:  Code stroke. Initial evaluation for acute right-sided weakness, slurred speech. EXAM: CT HEAD WITHOUT CONTRAST TECHNIQUE: Contiguous axial images were obtained from the base of the skull through the vertex without intravenous contrast. COMPARISON:  Prior study from 07/04/2020. FINDINGS: Brain: Cerebral volume within normal limits for patient age. No evidence for acute intracranial hemorrhage. No findings to suggest acute large vessel territory infarct. No mass lesion, midline shift, or mass effect. Ventricles are normal in size without evidence for hydrocephalus. No extra-axial fluid collection identified. Vascular: No hyperdense vessel identified. Skull: Scalp soft tissues demonstrate no acute abnormality. Calvarium intact. Sinuses/Orbits: Globes and orbital soft tissues within normal limits. Visualized paranasal  sinuses are clear. No mastoid effusion. ASPECTS [facility] Stroke Program Early CT Score) - Ganglionic level infarction (caudate, lentiform nuclei, internal capsule, insula, M1-M3 cortex): 7 - Supraganglionic infarction (M4-M6 cortex): 3 Total score (0-10 with 10 being normal): 10 IMPRESSION: 1. No acute intracranial infarct or other abnormality. 2. ASPECTS is 10. These results were communicated to Dr. Otelia Limes at 9:37 pmon 8/15/2021by text page via the [facility] messaging system. Electronically Signed   By: Rise Mu M.D.   On: 07/21/2020 21:39   CT HEAD CODE STROKE WO CONTRAST  Result Date: 07/04/2020 CLINICAL DATA:  Code stroke EXAM: CT HEAD WITHOUT CONTRAST CT ANGIOGRAPHY OF THE HEAD AND NECK TECHNIQUE: Contiguous axial images were obtained from the base of the skull through the vertex without intravenous contrast. Multidetector CT imaging of the head and neck was performed using the standard protocol during bolus administration of intravenous contrast. Multiplanar CT image reconstructions and MIPs were obtained to evaluate the vascular anatomy. Carotid stenosis measurements (when applicable) are obtained utilizing NASCET criteria, using the distal internal carotid diameter as the denominator. CONTRAST:  100 mL Omnipaque 350 COMPARISON:  05/03/2020 FINDINGS: CT HEAD Brain: There is no acute intracranial hemorrhage, mass effect, or edema.  Gray-white differentiation is preserved. There is no extra-axial fluid collection. Ventricles and sulci are within normal limits in size and configuration. Vascular: No hyperdense vessel or unexpected calcification. Skull: Calvarium is unremarkable. Sinuses/Orbits: No acute finding. Other: None. Review of the MIP images confirms the above findings CTA NECK Aortic arch: Great vessel origins are patent. There is direct origin of the left vertebral artery from the arch. Right carotid system: Patent. No measurable stenosis or evidence of dissection. Left carotid system:  Patent. No measurable stenosis or evidence of dissection. Vertebral arteries: Patent.  Right vertebral artery is dominant. Skeleton: Postoperative changes of anterior fusion at C4-C7. Degenerative changes of the cervical spine. Other neck: No mass or adenopathy. Upper chest: No apical lung mass. Review of the MIP images confirms the above findings CTA HEAD Anterior circulation: Intracranial internal carotid arteries are patent. Anterior and middle cerebral arteries are patent. Posterior circulation: Intracranial vertebral arteries, basilar artery, and posterior cerebral arteries are patent. A right posterior communicating artery is present. Venous sinuses: Patent as allowed by contrast bolus timing. Review of the MIP images confirms the above findings IMPRESSION: No acute intracranial hemorrhage or evidence of acute infarction. ASPECT score is 10. No large vessel occlusion or hemodynamically significant stenosis. These results were communicated to Dr. Otelia Limes at 12:24 pmon 7/29/2021by text page via the [facility] messaging system. Electronically Signed   By: Guadlupe Spanish M.D.   On: 07/04/2020 12:33   CT ANGIO HEAD CODE STROKE  Result Date: 07/21/2020 CLINICAL DATA:  Initial evaluation for acute stroke, speech difficulty, right-sided weakness. EXAM: CT ANGIOGRAPHY HEAD AND NECK TECHNIQUE: Multidetector CT imaging of the head and neck was performed using the standard protocol during bolus administration of intravenous contrast. Multiplanar CT image reconstructions and MIPs were obtained to evaluate the vascular anatomy. Carotid stenosis measurements (when applicable) are obtained utilizing NASCET criteria, using the distal internal carotid diameter as the denominator. CONTRAST:  OMNIPAQUE IOHEXOL 350 MG/ML SOLN COMPARISON:  Prior CTA from 07/04/2020. FINDINGS: CTA NECK FINDINGS Aortic arch: Visualized aortic arch of normal caliber with normal branch pattern. No flow-limiting stenosis seen about the origin of  the great vessels. Right carotid system: Right common and internal carotid arteries widely patent without stenosis, dissection or occlusion. Left carotid system: Left common and internal carotid arteries widely patent without stenosis, dissection or occlusion. Vertebral arteries: Right vertebral artery dominant and arises from the right subclavian artery. Left vertebral artery diffusely hypoplastic and arises directly from the aortic arch. Visualized vertebral arteries widely patent without stenosis, dissection or occlusion. Evaluation of the V2 segments mildly limited by streak artifact from cervical fusion hardware. Skeleton: No acute osseous abnormality. No discrete or worrisome osseous lesions. Prior fusion at C4 through C7. Trace grade 1 anterolisthesis of C7 on T1, chronic and facet mediated. Other neck: No other acute soft tissue abnormality within the neck. No mass lesion or adenopathy. Upper chest: Scattered atelectatic changes noted within the visualized lungs. Partially visualized upper chest demonstrates no other acute finding. Review of the MIP images confirms the above findings CTA HEAD FINDINGS Anterior circulation: Internal carotid arteries widely patent to the termini without stenosis. A1 segments widely patent. Normal anterior communicating artery complex. Anterior cerebral arteries patent to their distal aspects without stenosis. No M1 stenosis or occlusion. Normal MCA bifurcations. Distal MCA branches well perfused and symmetric. Posterior circulation: Vertebral arteries widely patent to the vertebrobasilar junction without stenosis. Left PICA patent. Right PICA not seen. Basilar widely patent to its distal aspect without stenosis. Superior  cerebral arteries patent bilaterally. Both PCAs primarily supplied via the basilar and are well perfused to their distal aspects. Prominent right posterior communicating artery noted. Venous sinuses: Patent. Anatomic variants: None significant.  No  intracranial aneurysm. Review of the MIP images confirms the above findings IMPRESSION: Negative CTA of the head and neck for large vessel occlusion. No hemodynamically significant stenosis or other acute vascular abnormality. No aneurysm. These results were communicated to Dr. Otelia Limes at 10:08 pmon 8/15/2021by text page via the [facility] messaging system. Electronically Signed   By: Rise Mu M.D.   On: 07/21/2020 22:11   CT ANGIO NECK CODE STROKE  Result Date: 07/21/2020 CLINICAL DATA:  Initial evaluation for acute stroke, speech difficulty, right-sided weakness. EXAM: CT ANGIOGRAPHY HEAD AND NECK TECHNIQUE: Multidetector CT imaging of the head and neck was performed using the standard protocol during bolus administration of intravenous contrast. Multiplanar CT image reconstructions and MIPs were obtained to evaluate the vascular anatomy. Carotid stenosis measurements (when applicable) are obtained utilizing NASCET criteria, using the distal internal carotid diameter as the denominator. CONTRAST:  OMNIPAQUE IOHEXOL 350 MG/ML SOLN COMPARISON:  Prior CTA from 07/04/2020. FINDINGS: CTA NECK FINDINGS Aortic arch: Visualized aortic arch of normal caliber with normal branch pattern. No flow-limiting stenosis seen about the origin of the great vessels. Right carotid system: Right common and internal carotid arteries widely patent without stenosis, dissection or occlusion. Left carotid system: Left common and internal carotid arteries widely patent without stenosis, dissection or occlusion. Vertebral arteries: Right vertebral artery dominant and arises from the right subclavian artery. Left vertebral artery diffusely hypoplastic and arises directly from the aortic arch. Visualized vertebral arteries widely patent without stenosis, dissection or occlusion. Evaluation of the V2 segments mildly limited by streak artifact from cervical fusion hardware. Skeleton: No acute osseous abnormality. No discrete or  worrisome osseous lesions. Prior fusion at C4 through C7. Trace grade 1 anterolisthesis of C7 on T1, chronic and facet mediated. Other neck: No other acute soft tissue abnormality within the neck. No mass lesion or adenopathy. Upper chest: Scattered atelectatic changes noted within the visualized lungs. Partially visualized upper chest demonstrates no other acute finding. Review of the MIP images confirms the above findings CTA HEAD FINDINGS Anterior circulation: Internal carotid arteries widely patent to the termini without stenosis. A1 segments widely patent. Normal anterior communicating artery complex. Anterior cerebral arteries patent to their distal aspects without stenosis. No M1 stenosis or occlusion. Normal MCA bifurcations. Distal MCA branches well perfused and symmetric. Posterior circulation: Vertebral arteries widely patent to the vertebrobasilar junction without stenosis. Left PICA patent. Right PICA not seen. Basilar widely patent to its distal aspect without stenosis. Superior cerebral arteries patent bilaterally. Both PCAs primarily supplied via the basilar and are well perfused to their distal aspects. Prominent right posterior communicating artery noted. Venous sinuses: Patent. Anatomic variants: None significant.  No intracranial aneurysm. Review of the MIP images confirms the above findings IMPRESSION: Negative CTA of the head and neck for large vessel occlusion. No hemodynamically significant stenosis or other acute vascular abnormality. No aneurysm. These results were communicated to Dr. Otelia Limes at 10:08 pmon 8/15/2021by text page via the [facility] messaging system. Electronically Signed   By: Rise Mu M.D.   On: 07/21/2020 22:11      Subjective: - no chest pain, shortness of breath, no abdominal pain, nausea or vomiting.   Discharge Exam: BP (!) 129/91 (BP Location: Right Arm)   Pulse 67   Temp 98 F (36.7 C) (Oral)  Resp 16   Ht 5\' 7"  (1.702 m)   Wt 86 kg   SpO2 97%    BMI 29.69 kg/m   General: Pt is alert, awake, not in acute distress Cardiovascular: RRR, S1/S2 +, no rubs, no gallops Respiratory: CTA bilaterally, no wheezing, no rhonchi Abdominal: Soft, NT, ND, bowel sounds + Extremities: no edema, no cyanosis    The results of significant diagnostics from this hospitalization (including imaging, microbiology, ancillary and laboratory) are listed below for reference.     Microbiology: Recent Results (from the past 240 hour(s))  SARS Coronavirus 2 by RT PCR (hospital order, performed in [facility] hospital lab) Nasopharyngeal Nasopharyngeal Swab     Status: None   Collection Time: 07/21/20  9:45 PM   Specimen: Nasopharyngeal Swab  Result Value Ref Range Status   SARS Coronavirus 2 NEGATIVE NEGATIVE Final    Comment: (NOTE) SARS-CoV-2 target nucleic acids are NOT DETECTED.  The SARS-CoV-2 RNA is generally detectable in upper and lower respiratory specimens during the acute phase of infection. The lowest concentration of SARS-CoV-2 viral copies this assay can detect is 250 copies / mL. A negative result does not preclude SARS-CoV-2 infection and should not be used as the sole basis for treatment or other patient management decisions.  A negative result may occur with improper specimen collection / handling, submission of specimen other than nasopharyngeal swab, presence of viral mutation(s) within the areas targeted by this assay, and inadequate number of viral copies (<250 copies / mL). A negative result must be combined with clinical observations, patient history, and epidemiological information.  Fact Sheet for Patients:   07/23/20  Fact Sheet for Healthcare Providers: BoilerBrush.com.cy  This test is not yet approved or  cleared by the https://pope.com/ FDA and has been authorized for detection and/or diagnosis of SARS-CoV-2 by FDA under an Emergency Use Authorization (EUA).  This  EUA will remain in effect (meaning this test can be used) for the duration of the COVID-19 declaration under Section 564(b)(1) of the Act, 21 U.S.C. section 360bbb-3(b)(1), unless the authorization is terminated or revoked sooner.  Performed at [facility], [address] [place], [place]      Labs: Basic Metabolic Panel: Recent Labs  Lab 07/18/20 1844 07/21/20 2124 07/21/20 2133 07/22/20 0754 07/24/20 0155  NA 139 140 143 141 138  K 4.7 4.1 4.0 4.0 4.0  CL 109 109 111 110 110  CO2 22 22  --  21* 22  GLUCOSE 133* 88 83 97 167*  BUN 14 18 20 15 16   CREATININE 1.33* 1.30* 1.20 1.15 1.55*  CALCIUM 9.2 9.6  --  9.2 9.4  MG  --   --   --   --  2.1   Liver Function Tests: Recent Labs  Lab 07/21/20 2124  AST 23  ALT 30  ALKPHOS 54  BILITOT 0.3  PROT 6.6  ALBUMIN 4.0   CBC: Recent Labs  Lab 07/18/20 1844 07/21/20 2124 07/21/20 2133 07/22/20 0754  WBC 5.9 5.9  --  5.0  NEUTROABS  --  3.5  --   --   HGB 11.8* 11.0* 11.9* 11.4*  HCT 37.7* 35.0* 35.0* 36.0*  MCV 87.9 86.8  --  85.9  PLT 171 151  --  152   CBG: Recent Labs  Lab 07/24/20 1131 07/24/20 1712 07/24/20 2036 07/25/20 0630 07/25/20 1133  GLUCAP 160* 146* 178* 140* 135*   Hgb A1c No results for input(s): HGBA1C in the last 72  hours. Lipid Profile No results for input(s): CHOL, HDL, LDLCALC, TRIG, CHOLHDL, LDLDIRECT in the last 72 hours. Thyroid function studies No results for input(s): TSH, T4TOTAL, T3FREE, THYROIDAB in the last 72 hours.  Invalid input(s): FREET3 Urinalysis    Component Value Date/Time   COLORURINE YELLOW 05/03/2020 0250   APPEARANCEUR CLEAR 05/03/2020 0250   LABSPEC 1.021 05/03/2020 0250   PHURINE 5.0 05/03/2020 0250   GLUCOSEU 50 (A) 05/03/2020 0250   HGBUR NEGATIVE 05/03/2020 0250   BILIRUBINUR NEGATIVE 05/03/2020 0250   KETONESUR NEGATIVE 05/03/2020 0250   PROTEINUR NEGATIVE 05/03/2020 0250   UROBILINOGEN 0.2 08/29/2015 1125   NITRITE NEGATIVE  05/03/2020 0250   LEUKOCYTESUR NEGATIVE 05/03/2020 0250    FURTHER DISCHARGE INSTRUCTIONS:   Get Medicines reviewed and adjusted: Please take all your medications with you for your next visit with your Primary MD   Laboratory/radiological data: Please request your Primary MD to go over all hospital tests and procedure/radiological results at the follow up, please ask your Primary MD to get all Hospital records sent to his/her office.   In some cases, they will be blood work, cultures and biopsy results pending at the time of your discharge. Please request that your primary care M.D. goes through all the records of your hospital data and follows up on these results.   Also Note the following: If you experience worsening of your admission symptoms, develop shortness of breath, life threatening emergency, suicidal or homicidal thoughts you must seek medical attention immediately by calling 911 or calling your MD immediately  if symptoms less severe.   You must read complete instructions/literature along with all the possible adverse reactions/side effects for all the Medicines you take and that have been prescribed to you. Take any new Medicines after you have completely understood and accpet all the possible adverse reactions/side effects.    Do not drive when taking Pain medications or sleeping medications (Benzodaizepines)   Do not take more than prescribed Pain, Sleep and Anxiety Medications. It is not advisable to combine anxiety,sleep and pain medications without talking with your primary care practitioner   Special Instructions: If you have smoked or chewed Tobacco  in the last 2 yrs please stop smoking, stop any regular Alcohol  and or any Recreational drug use.   Wear Seat belts while driving.   Please note: You were cared for by a hospitalist during your hospital stay. Once you are discharged, your primary care physician will handle any further medical issues. Please note that NO  REFILLS for any discharge medications will be authorized once you are discharged, as it is imperative that you return to your primary care physician (or establish a relationship with a primary care physician if you do not have one) for your post hospital discharge needs so that they can reassess your need for medications and monitor your lab values.  Time coordinating discharge: 40 minutes  SIGNED:  Pamella Pertostin Colby Catanese, MD, PhD 07/25/2020, 2:36 PM

## 2020-07-25 NOTE — Plan of Care (Signed)

## 2020-08-02 ENCOUNTER — Other Ambulatory Visit: Payer: Self-pay | Admitting: Nephrology

## 2020-08-02 DIAGNOSIS — N182 Chronic kidney disease, stage 2 (mild): Secondary | ICD-10-CM

## 2020-08-09 ENCOUNTER — Other Ambulatory Visit: Payer: Self-pay | Admitting: Nephrology

## 2020-08-14 ENCOUNTER — Emergency Department (HOSPITAL_COMMUNITY): Payer: Medicare Other

## 2020-08-14 ENCOUNTER — Other Ambulatory Visit: Payer: Self-pay

## 2020-08-14 ENCOUNTER — Emergency Department (HOSPITAL_COMMUNITY)
Admission: EM | Admit: 2020-08-14 | Discharge: 2020-08-15 | Disposition: A | Discharge status: Home / Self Care | Payer: Medicare Other | Attending: Emergency Medicine | Admitting: Emergency Medicine

## 2020-08-14 DIAGNOSIS — Z79899 Other long term (current) drug therapy: Secondary | ICD-10-CM | POA: Diagnosis not present

## 2020-08-14 DIAGNOSIS — Z7984 Long term (current) use of oral hypoglycemic drugs: Secondary | ICD-10-CM | POA: Diagnosis not present

## 2020-08-14 DIAGNOSIS — N183 Chronic kidney disease, stage 3 unspecified: Secondary | ICD-10-CM | POA: Diagnosis not present

## 2020-08-14 DIAGNOSIS — E1122 Type 2 diabetes mellitus with diabetic chronic kidney disease: Secondary | ICD-10-CM | POA: Insufficient documentation

## 2020-08-14 DIAGNOSIS — Z87891 Personal history of nicotine dependence: Secondary | ICD-10-CM | POA: Diagnosis not present

## 2020-08-14 DIAGNOSIS — R55 Syncope and collapse: Secondary | ICD-10-CM

## 2020-08-14 DIAGNOSIS — J45909 Unspecified asthma, uncomplicated: Secondary | ICD-10-CM | POA: Diagnosis not present

## 2020-08-14 DIAGNOSIS — I129 Hypertensive chronic kidney disease with stage 1 through stage 4 chronic kidney disease, or unspecified chronic kidney disease: Secondary | ICD-10-CM | POA: Insufficient documentation

## 2020-08-14 DIAGNOSIS — R079 Chest pain, unspecified: Secondary | ICD-10-CM | POA: Diagnosis not present

## 2020-08-14 LAB — CBC WITH DIFFERENTIAL/PLATELET
Abs Immature Granulocytes: 0.02 10*3/uL (ref 0.00–0.07)
Basophils Absolute: 0 10*3/uL (ref 0.0–0.1)
Basophils Relative: 0 %
Eosinophils Absolute: 0.1 10*3/uL (ref 0.0–0.5)
Eosinophils Relative: 2 %
HCT: 36.1 % — ABNORMAL LOW (ref 39.0–52.0)
Hemoglobin: 11.2 g/dL — ABNORMAL LOW (ref 13.0–17.0)
Immature Granulocytes: 0 %
Lymphocytes Relative: 31 %
Lymphs Abs: 1.5 10*3/uL (ref 0.7–4.0)
MCH: 27.2 pg (ref 26.0–34.0)
MCHC: 31 g/dL (ref 30.0–36.0)
MCV: 87.6 fL (ref 80.0–100.0)
Monocytes Absolute: 0.3 10*3/uL (ref 0.1–1.0)
Monocytes Relative: 7 %
Neutro Abs: 2.9 10*3/uL (ref 1.7–7.7)
Neutrophils Relative %: 60 %
Platelets: 197 10*3/uL (ref 150–400)
RBC: 4.12 MIL/uL — ABNORMAL LOW (ref 4.22–5.81)
RDW: 15.7 % — ABNORMAL HIGH (ref 11.5–15.5)
WBC: 4.9 10*3/uL (ref 4.0–10.5)
nRBC: 0 % (ref 0.0–0.2)

## 2020-08-14 LAB — COMPREHENSIVE METABOLIC PANEL
ALT: 31 U/L (ref 0–44)
AST: 32 U/L (ref 15–41)
Albumin: 3.7 g/dL (ref 3.5–5.0)
Alkaline Phosphatase: 56 U/L (ref 38–126)
Anion gap: 10 (ref 5–15)
BUN: 14 mg/dL (ref 6–20)
CO2: 19 mmol/L — ABNORMAL LOW (ref 22–32)
Calcium: 8.9 mg/dL (ref 8.9–10.3)
Chloride: 108 mmol/L (ref 98–111)
Creatinine, Ser: 1.35 mg/dL — ABNORMAL HIGH (ref 0.61–1.24)
GFR calc Af Amer: >60 mL/min (ref >60)
GFR calc non Af Amer: 57 mL/min — ABNORMAL LOW (ref >60)
Glucose, Bld: 163 mg/dL — ABNORMAL HIGH (ref 70–99)
Potassium: 3.9 mmol/L (ref 3.5–5.1)
Sodium: 137 mmol/L (ref 135–145)
Total Bilirubin: 0.6 mg/dL (ref 0.3–1.2)
Total Protein: 6.3 g/dL — ABNORMAL LOW (ref 6.5–8.1)

## 2020-08-14 LAB — TROPONIN I (HIGH SENSITIVITY): Troponin I (High Sensitivity): 3 ng/L (ref <18)

## 2020-08-14 NOTE — ED Triage Notes (Signed)
Pt coming from home with EMS after pt found unresponsive. Pt states he does not remember what happened, he thinks he passed out. Pt with 9/10 mid sternal CP radiating to LT arm. Pt was orthostatic so was not given nitro PTA. Allergic to aspirin.

## 2020-08-15 LAB — TROPONIN I (HIGH SENSITIVITY): Troponin I (High Sensitivity): 3 ng/L (ref <18)

## 2020-08-15 NOTE — ED Provider Notes (Signed)
[name] EMERGENCY DEPARTMENT Provider Note   CSN: [id number] Arrival date & time: 08/14/20  1945     History Chief Complaint  Patient presents with  . Chest Pain    Nicholas [name] is a 59 y.o. male.  HPI Patient presents after syncopal episode.  History of same.  Has been extensively worked up recently for this.  Had been question of seizures but also has had episodes with no abnormalities on EEG.  States that he was found unresponsive.  Does not remember.  Patient thinks he could have passed out.  Has some mid chest pain.  States has been having episodes of passing out sometimes gets chest pain and sometimes does not.  Has not had exertional chest pain.  No swelling in his legs.  No difficulty breathing.  No loss of bladder or bowel control.    Past Medical History:  Diagnosis Date  . Asthma   . Bipolar 1 disorder (HCC)   . Borderline glaucoma   . Chronic pain   . Epididymal pain    LEFT  . 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 Feeling of incomplete bladder emptying   . Frequency of urination   . Gastric ulcer   . GERD (gastroesophageal reflux disease)   . Hypertension   . Hyperthyroidism    NO MEDS   . Migraine   . Seizures (HCC)   . TIA (transient ischemic attack)   . Type 2 diabetes mellitus [facility])     Patient Active Problem List   Diagnosis Date Noted  . Altered mental status 07/23/2020  . Seizure (HCC) 07/22/2020  . Left-sided weakness 07/04/2020  . Dysarthria 07/04/2020  . Essential hypertension   . Seizure disorder (HCC) 07/02/2017  . Insulin-requiring or dependent type II diabetes mellitus (HCC) 07/02/2017  . CKD (chronic kidney disease), stage III 07/02/2017  . Normocytic anemia 07/02/2017  . Testicular/scrotal pain 11/09/2013  . Microhematuria 11/09/2013  . Condyloma acuminatum of scrotum 11/09/2013    Past Surgical History:    Procedure Laterality Date  . ABDOMINAL SURGERY    . ANTERIOR CERVICAL DECOMP/DISCECTOMY FUSION  2007   C4  --  C6  . CERVICAL FUSION    . CHOLECYSTECTOMY    . CYSTOSCOPY N/A 11/09/2013   Procedure: CYSTOSCOPY FLEXIBLE;  Surgeon: Bjorn Pippin, MD;  Location: [facility];  Service: Urology;  Laterality: N/A;  . EPIDIDYMECTOMY Left 11/09/2013   Procedure:  LEFT EPIDIDYMECTOMY;  Surgeon: Bjorn Pippin, MD;  Location[facility];  Service: Urology;  Laterality: Left;  POSSIBLE OUTPATIENT WITH OBSERVATION  . EXCISION LIPOMA LEFT SHOULDER  2004 (APPROX)  . MULTIPLE CYST REMOVED FROM CHEST  AGE 34  . OTHER SURGICAL HISTORY     hemorroid surgery   . TESTICLE REMOVAL Left   . TONSILLECTOMY         Family History  Problem Relation Age of Onset  . Heart failure Mother   . Diabetes Mother   . Hypertension Mother   . Cirrhosis Father   . Heart failure Father   . Kidney disease Father     Social History   Tobacco Use  . Smoking status: Former Smoker    Packs/day: 0.25    Years: 15.00    Pack years: 3.75    Types: Cigarettes    Quit date: 11/08/1992  Years since quitting: 27.7  . Smokeless tobacco: Never Used  Vaping Use  . Vaping Use: Never used  Substance Use Topics  . Alcohol use: No  . Drug use: No    Home Medications Prior to Admission medications   Medication Sig Start Date End Date Taking? Authorizing Provider  acetaminophen (TYLENOL 8 HOUR) 650 MG CR tablet Take 1 tablet (650 mg total) by mouth every 8 (eight) hours. 05/09/19   Derwood Kaplan, MD  albuterol (PROVENTIL HFA;VENTOLIN HFA) 108 (90 Base) MCG/ACT inhaler Inhale 1-2 puffs into the lungs every 6 (six) hours as needed for wheezing or shortness of breath. 11/15/18   Rancour, Jeannett Senior, MD  atorvastatin (LIPITOR) 80 MG tablet Take 80 mg by mouth at bedtime.     [provider]  budesonide-formoterol (SYMBICORT) 80-4.5 MCG/ACT inhaler Inhale 2 puffs into the lungs daily.    [provider]  clopidogrel (PLAVIX) 75 MG tablet Take 1 tablet (75 mg total) by mouth daily. 07/05/20   Azucena Fallen, MD  ergocalciferol (VITAMIN D2) 1.25 MG (50000 UT) capsule Take 50,000 Units by mouth every Monday.    [provider]  fenofibrate (TRICOR) 145 MG tablet Take 145 mg by mouth daily. 10/24/19   [provider]  fluticasone-salmeterol (ADVAIR HFA) 115-21 MCG/ACT inhaler Inhale 2 puffs into the lungs daily.    [provider]  gabapentin (NEURONTIN) 300 MG capsule Take 2 capsules (600 mg total) by mouth 2 (two) times daily. 07/06/20 08/05/20  Azucena Fallen, MD  HUMALOG KWIKPEN 200 UNIT/ML KwikPen Inject 0-10 Units into the skin 3 (three) times daily with meals. 07/05/20   Azucena Fallen, MD  latanoprost (XALATAN) 0.005 % ophthalmic solution Place 1 drop into both eyes at bedtime.  07/04/20   [provider]  levETIRAcetam (KEPPRA) 250 MG tablet Take 3 tablets (750 mg total) by mouth 2 (two) times daily. 750 mg twice daily for a month, then on 08/25/20 take 500 mg twice daily for a month then 09/24/20 take 250 mg twice daily for a month then stop 07/25/20   Leatha Gilding, MD  metFORMIN (GLUCOPHAGE) 1000 MG tablet Take 1 tablet (1,000 mg total) by mouth 2 (two) times daily. 07/05/20   Azucena Fallen, MD  nortriptyline (PAMELOR) 50 MG capsule Take 100 mg by mouth at bedtime.     [provider]  pantoprazole (PROTONIX) 40 MG tablet Take 40 mg by mouth 2 (two) times daily. 07/03/20   [provider]  polyethylene glycol powder (GLYCOLAX/MIRALAX) 17 GM/SCOOP powder Take 17 g by mouth daily as needed for mild constipation. 12/31/15   [provider]  rizatriptan (MAXALT) 10 MG tablet Take 10 mg by mouth daily. May repeat in 2 hours if needed     [provider]  sertraline (ZOLOFT) 50 MG tablet Take 50 mg by mouth daily. 06/13/20   [provider]  SUMAtriptan (IMITREX) 100 MG tablet Take 1  tablet (100 mg total) by mouth daily as needed for migraine or headache. May repeat in 2 hours if headache persists or recurs. 07/25/20   Leatha Gilding, MD  timolol (TIMOPTIC) 0.5 % ophthalmic solution Place 1 drop into both eyes 2 (two) times daily.  04/06/20   [provider]  valsartan (DIOVAN) 320 MG tablet Take 320 mg by mouth daily. 07/04/20   [provider]  zonisamide (ZONEGRAN) 100 MG capsule Take 200 mg by mouth 2 (two) times daily.  05/17/20   [provider]    Allergies    Levothyroxine, Nsaids, Amoxicillin, Ampicillin, Penicillins, Strawberry extract, Asa [aspirin], Bactrim [sulfamethoxazole-trimethoprim], Depakote [divalproex sodium], Dilantin [phenytoin], Methocarbamol, Risperidone and related, Sitagliptin, Strawberry (diagnostic), Sulfa antibiotics, Tolmetin, Ultram [tramadol hcl], and Oatmeal  Review of Systems   Review of Systems  Constitutional: Negative for appetite change.  HENT: Negative for congestion.   Respiratory: Negative for shortness of breath.   Cardiovascular: Positive for chest pain. Negative for palpitations.  Gastrointestinal: Negative for abdominal pain.  Musculoskeletal: Negative for back pain.  Skin: Negative for rash.  Neurological: Positive for syncope.  Psychiatric/Behavioral: Negative for confusion.    Physical Exam Updated Vital Signs BP 113/77   Pulse 82   Temp 98.1 F (36.7 C) (Oral)   Resp (!) 9   SpO2 94%   Physical Exam Vitals and nursing note reviewed.  HENT:     Head: Atraumatic.  Cardiovascular:     Rate and Rhythm: Normal rate and regular rhythm.  Pulmonary:     Breath sounds: No wheezing or rhonchi.  Chest:     Chest wall: No tenderness, crepitus or edema.  Abdominal:     Tenderness: There is no abdominal tenderness.  Musculoskeletal:     Right lower leg: No edema.     Left lower leg: No edema.  Skin:    General: Skin is warm.     Capillary Refill: Capillary refill takes less than 2  seconds.  Neurological:     Mental Status: He is alert and oriented to person, place, and time.     ED Results / Procedures / Treatments   Labs (all labs ordered are listed, but only abnormal results are displayed) Labs Reviewed  COMPREHENSIVE METABOLIC PANEL - Abnormal; Notable for the following components:      Result Value   CO2 19 (*)    Glucose, Bld 163 (*)    Creatinine, Ser 1.35 (*)    Total Protein 6.3 (*)    GFR calc non Af Amer 57 (*)    All other components within normal limits  CBC WITH DIFFERENTIAL/PLATELET - Abnormal; Notable for the following components:   RBC 4.12 (*)    Hemoglobin 11.2 (*)    HCT 36.1 (*)    RDW 15.7 (*)    All other components within normal limits  URINALYSIS, ROUTINE W REFLEX MICROSCOPIC  TROPONIN I (HIGH SENSITIVITY)  TROPONIN I (HIGH SENSITIVITY)    EKG EKG Interpretation  Date/Time:  Wednesday August 14 2020 19:51:08 EDT Ventricular Rate:  84 PR Interval:    QRS Duration: 91 QT Interval:  384 QTC Calculation: 454 R Axis:   -45 Text Interpretation: Sinus rhythm LAD, consider left anterior fascicular block Low voltage, precordial leads Consider anterior infarct No significant change since last tracing Confirmed by Benjiman Core [id number]) on 08/14/2020 7:57:17 PM   Radiology DG Chest Portable 1 View  Result Date: 08/14/2020 CLINICAL DATA:  Chest pain, cough, lower back pain for 2 weeks EXAM: PORTABLE CHEST 1 VIEW COMPARISON:  04/07/2020 FINDINGS: The heart size and mediastinal contours are within normal limits. Both lungs are clear. The visualized skeletal structures are unremarkable. IMPRESSION: No active disease. Electronically Signed   By: Sharlet Salina M.D.   On: 08/14/2020 21:57    Procedures Procedures (including critical care time)  Medications Ordered in ED Medications - No data to display  ED Course  I have reviewed the triage vital signs and the nursing notes.  Pertinent labs & imaging results that were  available during  my care of the patient were reviewed by me and considered in my medical decision making (see chart for details).    MDM Rules/Calculators/A&P                          Patient with syncope.  History of same.  Some previous question of seizures.  EKG reassuring.  Troponin negative.  Initially had some low blood pressure.  Patient feeling much better now.  No further chest pain.  States been having episodes where he passes out like this and will wake up hours later.  Has a neurologist.  Doubt severe arrhythmia as a cause.  Doubt cardiac ischemia.  If second troponin is negative I think he is low enough risk for discharge home. Final Clinical Impression(s) / ED Diagnoses Final diagnoses:  Syncope, unspecified syncope type  Nonspecific chest pain    Rx / DC Orders ED Discharge Orders    None       Benjiman CorePickering, Loriene Taunton, MD 08/15/20 [phone]

## 2020-08-15 NOTE — Discharge Instructions (Signed)
Follow-up with your doctor for further management of these episodes.  Do not put yourself in a position where having an episode could cause problems.

## 2020-09-02 ENCOUNTER — Inpatient Hospital Stay (HOSPITAL_COMMUNITY): Payer: Medicare Other

## 2020-09-02 ENCOUNTER — Other Ambulatory Visit: Payer: Self-pay

## 2020-09-02 ENCOUNTER — Emergency Department (HOSPITAL_COMMUNITY): Payer: Medicare Other

## 2020-09-02 ENCOUNTER — Inpatient Hospital Stay (HOSPITAL_COMMUNITY)
Admission: EM | Admit: 2020-09-02 | Discharge: 2020-09-05 | DRG: 103 | Disposition: A | Discharge status: Skilled Nursing Facility | Payer: Medicare Other | Attending: Family Medicine | Admitting: Family Medicine

## 2020-09-02 DIAGNOSIS — Z841 Family history of disorders of kidney and ureter: Secondary | ICD-10-CM | POA: Diagnosis not present

## 2020-09-02 DIAGNOSIS — N179 Acute kidney failure, unspecified: Secondary | ICD-10-CM | POA: Diagnosis present

## 2020-09-02 DIAGNOSIS — E039 Hypothyroidism, unspecified: Secondary | ICD-10-CM | POA: Diagnosis present

## 2020-09-02 DIAGNOSIS — Z794 Long term (current) use of insulin: Secondary | ICD-10-CM

## 2020-09-02 DIAGNOSIS — Z882 Allergy status to sulfonamides status: Secondary | ICD-10-CM

## 2020-09-02 DIAGNOSIS — I129 Hypertensive chronic kidney disease with stage 1 through stage 4 chronic kidney disease, or unspecified chronic kidney disease: Secondary | ICD-10-CM | POA: Diagnosis present

## 2020-09-02 DIAGNOSIS — Z8669 Personal history of other diseases of the nervous system and sense organs: Secondary | ICD-10-CM

## 2020-09-02 DIAGNOSIS — G8194 Hemiplegia, unspecified affecting left nondominant side: Secondary | ICD-10-CM | POA: Diagnosis present

## 2020-09-02 DIAGNOSIS — Z888 Allergy status to other drugs, medicaments and biological substances status: Secondary | ICD-10-CM

## 2020-09-02 DIAGNOSIS — R339 Retention of urine, unspecified: Secondary | ICD-10-CM | POA: Diagnosis not present

## 2020-09-02 DIAGNOSIS — I639 Cerebral infarction, unspecified: Secondary | ICD-10-CM | POA: Diagnosis present

## 2020-09-02 DIAGNOSIS — Z7951 Long term (current) use of inhaled steroids: Secondary | ICD-10-CM | POA: Diagnosis not present

## 2020-09-02 DIAGNOSIS — G43109 Migraine with aura, not intractable, without status migrainosus: Principal | ICD-10-CM | POA: Diagnosis present

## 2020-09-02 DIAGNOSIS — G4733 Obstructive sleep apnea (adult) (pediatric): Secondary | ICD-10-CM | POA: Diagnosis present

## 2020-09-02 DIAGNOSIS — Z8249 Family history of ischemic heart disease and other diseases of the circulatory system: Secondary | ICD-10-CM

## 2020-09-02 DIAGNOSIS — R4781 Slurred speech: Secondary | ICD-10-CM | POA: Diagnosis present

## 2020-09-02 DIAGNOSIS — E1122 Type 2 diabetes mellitus with diabetic chronic kidney disease: Secondary | ICD-10-CM | POA: Diagnosis present

## 2020-09-02 DIAGNOSIS — Z881 Allergy status to other antibiotic agents status: Secondary | ICD-10-CM

## 2020-09-02 DIAGNOSIS — N183 Chronic kidney disease, stage 3 unspecified: Secondary | ICD-10-CM | POA: Diagnosis present

## 2020-09-02 DIAGNOSIS — G40909 Epilepsy, unspecified, not intractable, without status epilepticus: Secondary | ICD-10-CM | POA: Diagnosis present

## 2020-09-02 DIAGNOSIS — G8929 Other chronic pain: Secondary | ICD-10-CM | POA: Diagnosis present

## 2020-09-02 DIAGNOSIS — I6389 Other cerebral infarction: Secondary | ICD-10-CM | POA: Diagnosis not present

## 2020-09-02 DIAGNOSIS — Z833 Family history of diabetes mellitus: Secondary | ICD-10-CM | POA: Diagnosis not present

## 2020-09-02 DIAGNOSIS — G43809 Other migraine, not intractable, without status migrainosus: Secondary | ICD-10-CM | POA: Diagnosis not present

## 2020-09-02 DIAGNOSIS — F319 Bipolar disorder, unspecified: Secondary | ICD-10-CM | POA: Diagnosis present

## 2020-09-02 DIAGNOSIS — R072 Precordial pain: Secondary | ICD-10-CM

## 2020-09-02 DIAGNOSIS — K219 Gastro-esophageal reflux disease without esophagitis: Secondary | ICD-10-CM | POA: Diagnosis present

## 2020-09-02 DIAGNOSIS — R531 Weakness: Secondary | ICD-10-CM

## 2020-09-02 DIAGNOSIS — Z981 Arthrodesis status: Secondary | ICD-10-CM

## 2020-09-02 DIAGNOSIS — Z87891 Personal history of nicotine dependence: Secondary | ICD-10-CM

## 2020-09-02 DIAGNOSIS — Z20822 Contact with and (suspected) exposure to covid-19: Secondary | ICD-10-CM | POA: Diagnosis present

## 2020-09-02 DIAGNOSIS — Z886 Allergy status to analgesic agent status: Secondary | ICD-10-CM

## 2020-09-02 DIAGNOSIS — Z79899 Other long term (current) drug therapy: Secondary | ICD-10-CM | POA: Diagnosis not present

## 2020-09-02 DIAGNOSIS — Z885 Allergy status to narcotic agent status: Secondary | ICD-10-CM

## 2020-09-02 DIAGNOSIS — Z7902 Long term (current) use of antithrombotics/antiplatelets: Secondary | ICD-10-CM

## 2020-09-02 DIAGNOSIS — H409 Unspecified glaucoma: Secondary | ICD-10-CM | POA: Diagnosis present

## 2020-09-02 DIAGNOSIS — Z8711 Personal history of peptic ulcer disease: Secondary | ICD-10-CM

## 2020-09-02 DIAGNOSIS — Z91018 Allergy to other foods: Secondary | ICD-10-CM

## 2020-09-02 DIAGNOSIS — Z880 Allergy status to penicillin: Secondary | ICD-10-CM

## 2020-09-02 DIAGNOSIS — R569 Unspecified convulsions: Secondary | ICD-10-CM | POA: Diagnosis not present

## 2020-09-02 LAB — DIFFERENTIAL
Abs Immature Granulocytes: 0.02 10*3/uL (ref 0.00–0.07)
Basophils Absolute: 0 10*3/uL (ref 0.0–0.1)
Basophils Relative: 1 %
Eosinophils Absolute: 0.1 10*3/uL (ref 0.0–0.5)
Eosinophils Relative: 1 %
Immature Granulocytes: 0 %
Lymphocytes Relative: 27 %
Lymphs Abs: 1.5 10*3/uL (ref 0.7–4.0)
Monocytes Absolute: 0.4 10*3/uL (ref 0.1–1.0)
Monocytes Relative: 7 %
Neutro Abs: 3.7 10*3/uL (ref 1.7–7.7)
Neutrophils Relative %: 64 %

## 2020-09-02 LAB — ETHANOL: Alcohol, Ethyl (B): <10 mg/dL (ref <10)

## 2020-09-02 LAB — COMPREHENSIVE METABOLIC PANEL
ALT: 35 U/L (ref 0–44)
AST: 29 U/L (ref 15–41)
Albumin: 4.3 g/dL (ref 3.5–5.0)
Alkaline Phosphatase: 60 U/L (ref 38–126)
Anion gap: 12 (ref 5–15)
BUN: 12 mg/dL (ref 6–20)
CO2: 19 mmol/L — ABNORMAL LOW (ref 22–32)
Calcium: 9.8 mg/dL (ref 8.9–10.3)
Chloride: 112 mmol/L — ABNORMAL HIGH (ref 98–111)
Creatinine, Ser: 1.64 mg/dL — ABNORMAL HIGH (ref 0.61–1.24)
GFR calc Af Amer: 53 mL/min — ABNORMAL LOW (ref >60)
GFR calc non Af Amer: 45 mL/min — ABNORMAL LOW (ref >60)
Glucose, Bld: 153 mg/dL — ABNORMAL HIGH (ref 70–99)
Potassium: 4 mmol/L (ref 3.5–5.1)
Sodium: 143 mmol/L (ref 135–145)
Total Bilirubin: 0.4 mg/dL (ref 0.3–1.2)
Total Protein: 7.4 g/dL (ref 6.5–8.1)

## 2020-09-02 LAB — CBC
HCT: 42.2 % (ref 39.0–52.0)
Hemoglobin: 12.7 g/dL — ABNORMAL LOW (ref 13.0–17.0)
MCH: 27.5 pg (ref 26.0–34.0)
MCHC: 30.1 g/dL (ref 30.0–36.0)
MCV: 91.3 fL (ref 80.0–100.0)
Platelets: 166 10*3/uL (ref 150–400)
RBC: 4.62 MIL/uL (ref 4.22–5.81)
RDW: 15.7 % — ABNORMAL HIGH (ref 11.5–15.5)
WBC: 5.8 10*3/uL (ref 4.0–10.5)
nRBC: 0 % (ref 0.0–0.2)

## 2020-09-02 LAB — I-STAT CHEM 8, ED
BUN: 13 mg/dL (ref 6–20)
Calcium, Ion: 1.33 mmol/L (ref 1.15–1.40)
Chloride: 112 mmol/L — ABNORMAL HIGH (ref 98–111)
Creatinine, Ser: 1.6 mg/dL — ABNORMAL HIGH (ref 0.61–1.24)
Glucose, Bld: 145 mg/dL — ABNORMAL HIGH (ref 70–99)
HCT: 40 % (ref 39.0–52.0)
Hemoglobin: 13.6 g/dL (ref 13.0–17.0)
Potassium: 4 mmol/L (ref 3.5–5.1)
Sodium: 146 mmol/L — ABNORMAL HIGH (ref 135–145)
TCO2: 19 mmol/L — ABNORMAL LOW (ref 22–32)

## 2020-09-02 LAB — RESPIRATORY PANEL BY RT PCR (FLU A&B, COVID)
Influenza A by PCR: NEGATIVE
Influenza B by PCR: NEGATIVE
SARS Coronavirus 2 by RT PCR: NEGATIVE

## 2020-09-02 LAB — TROPONIN I (HIGH SENSITIVITY): Troponin I (High Sensitivity): 3 ng/L

## 2020-09-02 LAB — CBG MONITORING, ED
Glucose-Capillary: 145 mg/dL — ABNORMAL HIGH (ref 70–99)
Glucose-Capillary: 108 mg/dL — ABNORMAL HIGH (ref 70–99)

## 2020-09-02 LAB — PROTIME-INR
INR: 1.1 (ref 0.8–1.2)
Prothrombin Time: 13.8 seconds (ref 11.4–15.2)

## 2020-09-02 LAB — APTT: aPTT: 30 seconds (ref 24–36)

## 2020-09-02 MED ORDER — LEVETIRACETAM 750 MG PO TABS: 750.0000 mg | ORAL_TABLET | Freq: Two times a day (BID) | ORAL | Status: DC

## 2020-09-02 MED ORDER — FENOFIBRATE 160 MG PO TABS
160.0000 mg | ORAL_TABLET | Freq: Every day | ORAL | Status: DC
  Administered 2020-09-04 – 2020-09-05 (×2): 160 mg via ORAL
  Filled 2020-09-02 (×3): qty 1

## 2020-09-02 MED ORDER — ALBUTEROL SULFATE HFA 108 (90 BASE) MCG/ACT IN AERS
1.0000 | INHALATION_SPRAY | Freq: Four times a day (QID) | RESPIRATORY_TRACT | Status: DC | PRN
  Filled 2020-09-02: qty 6.7

## 2020-09-02 MED ORDER — LATANOPROST 0.005 % OP SOLN
1.0000 [drp] | Freq: Every day | OPHTHALMIC | Status: DC
  Administered 2020-09-03 – 2020-09-04 (×2): 1 [drp] via OPHTHALMIC
  Filled 2020-09-02 (×2): qty 2.5

## 2020-09-02 MED ORDER — SODIUM CHLORIDE 0.9 % IV SOLN
INTRAVENOUS | Status: AC
Start: 2020-09-02 — End: 2020-09-03

## 2020-09-02 MED ORDER — PANTOPRAZOLE SODIUM 40 MG PO TBEC
40.0000 mg | DELAYED_RELEASE_TABLET | Freq: Two times a day (BID) | ORAL | Status: DC
  Administered 2020-09-02 – 2020-09-05 (×6): 40 mg via ORAL
  Filled 2020-09-02 (×6): qty 1

## 2020-09-02 MED ORDER — ACETAMINOPHEN 650 MG RE SUPP: 650.0000 mg | RECTAL | Status: DC | PRN

## 2020-09-02 MED ORDER — SUMATRIPTAN SUCCINATE 50 MG PO TABS
50.0000 mg | ORAL_TABLET | ORAL | Status: DC | PRN
  Filled 2020-09-02: qty 1

## 2020-09-02 MED ORDER — STROKE: EARLY STAGES OF RECOVERY BOOK
Freq: Once | Status: DC
  Filled 2020-09-02 (×2): qty 1

## 2020-09-02 MED ORDER — ATORVASTATIN CALCIUM 80 MG PO TABS
80.0000 mg | ORAL_TABLET | Freq: Every day | ORAL | Status: DC
  Administered 2020-09-02 – 2020-09-04 (×3): 80 mg via ORAL
  Filled 2020-09-02: qty 8
  Filled 2020-09-02 (×2): qty 1

## 2020-09-02 MED ORDER — POLYETHYLENE GLYCOL 3350 17 G PO PACK: 17.0000 g | PACK | Freq: Every day | ORAL | Status: DC | PRN

## 2020-09-02 MED ORDER — NORTRIPTYLINE HCL 25 MG PO CAPS
100.0000 mg | ORAL_CAPSULE | Freq: Every day | ORAL | Status: DC
  Administered 2020-09-03 – 2020-09-04 (×2): 100 mg via ORAL
  Filled 2020-09-02 (×3): qty 4

## 2020-09-02 MED ORDER — CLOPIDOGREL BISULFATE 75 MG PO TABS
75.0000 mg | ORAL_TABLET | Freq: Every day | ORAL | Status: DC
  Administered 2020-09-02 – 2020-09-05 (×4): 75 mg via ORAL
  Filled 2020-09-02 (×4): qty 1

## 2020-09-02 MED ORDER — SENNOSIDES-DOCUSATE SODIUM 8.6-50 MG PO TABS: 1.0000 | ORAL_TABLET | Freq: Every evening | ORAL | Status: DC | PRN

## 2020-09-02 MED ORDER — PROCHLORPERAZINE EDISYLATE 10 MG/2ML IJ SOLN
10.0000 mg | Freq: Once | INTRAMUSCULAR | Status: AC
  Administered 2020-09-02: 10 mg via INTRAVENOUS
  Filled 2020-09-02: qty 2

## 2020-09-02 MED ORDER — METOPROLOL TARTRATE 25 MG PO TABS
25.0000 mg | ORAL_TABLET | Freq: Two times a day (BID) | ORAL | Status: DC
  Administered 2020-09-02 – 2020-09-05 (×6): 25 mg via ORAL
  Filled 2020-09-02 (×6): qty 1

## 2020-09-02 MED ORDER — FLUTICASONE FUROATE-VILANTEROL 100-25 MCG/INH IN AEPB
1.0000 | INHALATION_SPRAY | Freq: Every day | RESPIRATORY_TRACT | Status: DC
  Administered 2020-09-05: 08:00:00 1 via RESPIRATORY_TRACT
  Filled 2020-09-02: qty 28

## 2020-09-02 MED ORDER — LACTATED RINGERS IV BOLUS
1000.0000 mL | Freq: Once | INTRAVENOUS | Status: AC
  Administered 2020-09-02: 1000 mL via INTRAVENOUS

## 2020-09-02 MED ORDER — ACETAMINOPHEN 325 MG PO TABS: 650.0000 mg | ORAL_TABLET | ORAL | Status: DC | PRN

## 2020-09-02 MED ORDER — ACETAMINOPHEN 160 MG/5ML PO SOLN: 650.0000 mg | ORAL | Status: DC | PRN

## 2020-09-02 MED ORDER — ZONISAMIDE 100 MG PO CAPS
200.0000 mg | ORAL_CAPSULE | Freq: Two times a day (BID) | ORAL | Status: DC
  Administered 2020-09-03 – 2020-09-05 (×4): 200 mg via ORAL
  Filled 2020-09-02 (×5): qty 2

## 2020-09-02 MED ORDER — GABAPENTIN 300 MG PO CAPS
600.0000 mg | ORAL_CAPSULE | Freq: Two times a day (BID) | ORAL | Status: DC
  Administered 2020-09-02 – 2020-09-05 (×6): 600 mg via ORAL
  Filled 2020-09-02 (×6): qty 2

## 2020-09-02 MED ORDER — INSULIN ASPART 100 UNIT/ML ~~LOC~~ SOLN
0.0000 [IU] | Freq: Three times a day (TID) | SUBCUTANEOUS | Status: DC
  Administered 2020-09-03 (×2): 2 [IU] via SUBCUTANEOUS
  Administered 2020-09-04 (×2): 3 [IU] via SUBCUTANEOUS
  Administered 2020-09-04: 2 [IU] via SUBCUTANEOUS
  Administered 2020-09-05: 3 [IU] via SUBCUTANEOUS
  Administered 2020-09-05: 2 [IU] via SUBCUTANEOUS

## 2020-09-02 MED ORDER — IOHEXOL 350 MG/ML SOLN
100.0000 mL | Freq: Once | INTRAVENOUS | Status: AC | PRN
  Administered 2020-09-02: 100 mL via INTRAVENOUS

## 2020-09-02 MED ORDER — ACETAMINOPHEN 325 MG PO TABS
650.0000 mg | ORAL_TABLET | Freq: Three times a day (TID) | ORAL | Status: DC
  Administered 2020-09-02 – 2020-09-05 (×5): 650 mg via ORAL
  Filled 2020-09-02 (×5): qty 2

## 2020-09-02 MED ORDER — VITAMIN D (ERGOCALCIFEROL) 1.25 MG (50000 UNIT) PO CAPS: 50000.0000 [IU] | ORAL_CAPSULE | ORAL | Status: DC

## 2020-09-02 MED ORDER — LEVETIRACETAM 500 MG PO TABS
500.0000 mg | ORAL_TABLET | Freq: Two times a day (BID) | ORAL | Status: DC
  Administered 2020-09-02 – 2020-09-05 (×6): 500 mg via ORAL
  Filled 2020-09-02 (×6): qty 1

## 2020-09-02 MED ORDER — HYDRALAZINE HCL 25 MG PO TABS: 25.0000 mg | ORAL_TABLET | Freq: Four times a day (QID) | ORAL | Status: DC | PRN

## 2020-09-02 MED ORDER — SERTRALINE HCL 50 MG PO TABS
50.0000 mg | ORAL_TABLET | Freq: Every day | ORAL | Status: DC
  Administered 2020-09-02 – 2020-09-05 (×4): 50 mg via ORAL
  Filled 2020-09-02 (×4): qty 1

## 2020-09-02 MED ORDER — SUMATRIPTAN SUCCINATE 6 MG/0.5ML ~~LOC~~ SOLN
6.0000 mg | Freq: Once | SUBCUTANEOUS | Status: AC
  Administered 2020-09-02: 6 mg via SUBCUTANEOUS
  Filled 2020-09-02: qty 0.5

## 2020-09-02 MED ORDER — DIPHENHYDRAMINE HCL 50 MG/ML IJ SOLN
25.0000 mg | Freq: Once | INTRAMUSCULAR | Status: AC
  Administered 2020-09-02: 25 mg via INTRAVENOUS
  Filled 2020-09-02: qty 1

## 2020-09-02 MED ORDER — TIMOLOL MALEATE 0.5 % OP SOLN
1.0000 [drp] | Freq: Two times a day (BID) | OPHTHALMIC | Status: DC
  Administered 2020-09-02 – 2020-09-05 (×6): 1 [drp] via OPHTHALMIC
  Filled 2020-09-02 (×2): qty 5

## 2020-09-02 MED ORDER — HEPARIN SODIUM (PORCINE) 5000 UNIT/ML IJ SOLN
5000.0000 [IU] | Freq: Two times a day (BID) | INTRAMUSCULAR | Status: DC
  Administered 2020-09-02 – 2020-09-05 (×5): 5000 [IU] via SUBCUTANEOUS
  Filled 2020-09-02 (×6): qty 1

## 2020-09-02 NOTE — ED Notes (Signed)
Due to patient's somonlence, Dr Chipper Herb called to bedside to assess whether his presentation was the same as Dr Teola Bradley assessment several minutes prior. Per Dr Chipper Herb proceed with MRI, no new orders at this time

## 2020-09-02 NOTE — H&P (Signed)
History and Physical    Nicholas Leaverdward W Sistrunk ZOX:[id number] DOB: Apr 23, 1961 DOA: 09/02/2020  PCP: Patient, No Pcp Per (Confirm with patient/family/NH records and if not entered, [facility]) Patient coming from: Home  I have personally briefly reviewed [facility]  Chief Complaint: Left-sided weakness, left facial droop and trouble speaking  HPI: Nicholas [name] is a 59 y.o. male with medical history significant of hypertension, TIA, seizure, IDDM, HTN, presented with new onset of left-sided weakness, left facial droop and trouble speaking.  Symptoms started this morning however patient could not describe for sure given his slurred speech.  Patient also admitted a concurrent left-sided headache.  Last month patient had right-sided weakness and came to the ED and had a tonic-clonic seizure during ED stay, then he underwent 3 days of EEG monitoring it showed no signs of active seizure.  Patient pointed to his left forehead and admitted " headache", but mumbling after, and extend two fingers when asked about vision changes and admitted "double vision" when I asked. ED Course: Mentation varies from sleepy to totally awake.  No acute findings, CT angiogram of the brain showed there is a progressive high-grade focal stenosis within the proximal M2 right MCA branch vessel and moderate to severe focal stenosis with right PCA at P3/P2 junction which is appear to be new compared to a similar study done in August 2021.  Review of Systems: Patient mumbling, unable to complete rest of the ROS.   Past Medical History:  Diagnosis Date  . Asthma   . Bipolar 1 disorder (HCC)   . Borderline glaucoma   . Chronic pain   . Epididymal pain    LEFT  . 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 Feeling of incomplete bladder emptying   . Frequency of urination    . Gastric ulcer   . GERD (gastroesophageal reflux disease)   . Hypertension   . Hyperthyroidism    NO MEDS   . Migraine   . Seizures (HCC)   . TIA (transient ischemic attack)   . Type 2 diabetes mellitus (HCC)     Past Surgical History:  Procedure Laterality Date  . ABDOMINAL SURGERY    . ANTERIOR CERVICAL DECOMP/DISCECTOMY FUSION  2007   C4  --  C6  . CERVICAL FUSION    . CHOLECYSTECTOMY    . CYSTOSCOPY N/A 11/09/2013   Procedure: CYSTOSCOPY FLEXIBLE;  Surgeon: Bjorn PippinJohn Wrenn, MD;  [facility] [place];  Service: Urology;  Laterality: N/A;  . EPIDIDYMECTOMY Left 11/09/2013   Procedure:  LEFT EPIDIDYMECTOMY;  Surgeon: Bjorn PippinJohn Wrenn, MD;  [facility] [place];  Service: Urology;  Laterality: Left;  POSSIBLE OUTPATIENT WITH OBSERVATION  . EXCISION LIPOMA LEFT SHOULDER  2004 (APPROX)  . MULTIPLE CYST REMOVED FROM CHEST  AGE 66  . OTHER SURGICAL HISTORY     hemorroid surgery   . TESTICLE REMOVAL Left   . TONSILLECTOMY       reports that he quit smoking about 27 years ago. His smoking use included cigarettes. He has a 3.75 pack-year smoking history. He has never used smokeless tobacco. He reports that he does not drink alcohol and does not use drugs.  Allergies  Allergen Reactions  . Levothyroxine Anaphylaxis and Cough    Chronic cough  .  Nsaids Other (See Comments)    D/t gastric ulcer  . Amoxicillin Itching and Other (See Comments)    THRUSH Causes sores in mouth  . Ampicillin Other (See Comments)    THRUSH  . Penicillins Itching and Other (See Comments)    THRUSH Causes sores in mouth  . Strawberry Extract Swelling    LIPS SWELL  . Asa [Aspirin] Other (See Comments)    Bleeding   . Bactrim [Sulfamethoxazole-Trimethoprim] Hives, Itching and Other (See Comments)    GI upset  . Depakote [Divalproex Sodium]     Causes double vision and speech problems   . Dilantin [Phenytoin] Other (See Comments)    Severe skin peeling  . Methocarbamol Other  (See Comments)    dizziness  . Risperidone And Related Other (See Comments)    Hallucinations   . Sitagliptin Other (See Comments)    pancreatitis  . Strawberry (Diagnostic) Itching and Swelling  . Sulfa Antibiotics Other (See Comments)    Affects thyroid   . Tolmetin Other (See Comments)    D/t gastric ulcer  . Ultram [Tramadol Hcl] Other (See Comments)    D/t gastric ulcer  . Oatmeal Hives and Other (See Comments)    White spots/sores in mouth    Family History  Problem Relation Age of Onset  . Heart failure Mother   . Diabetes Mother   . Hypertension Mother   . Cirrhosis Father   . Heart failure Father   . Kidney disease Father      Prior to Admission medications   Medication Sig Start Date End Date Taking? Authorizing Provider  acetaminophen (TYLENOL 8 HOUR) 650 MG CR tablet Take 1 tablet (650 mg total) by mouth every 8 (eight) hours. 05/09/19   Derwood Kaplan, MD  albuterol (PROVENTIL HFA;VENTOLIN HFA) 108 (90 Base) MCG/ACT inhaler Inhale 1-2 puffs into the lungs every 6 (six) hours as needed for wheezing or shortness of breath. 11/15/18   Rancour, Jeannett Senior, MD  atorvastatin (LIPITOR) 80 MG tablet Take 80 mg by mouth at bedtime.     [provider]  budesonide-formoterol (SYMBICORT) 80-4.5 MCG/ACT inhaler Inhale 2 puffs into the lungs daily.    [provider]  clopidogrel (PLAVIX) 75 MG tablet Take 1 tablet (75 mg total) by mouth daily. 07/05/20   Azucena Fallen, MD  ergocalciferol (VITAMIN D2) 1.25 MG (50000 UT) capsule Take 50,000 Units by mouth every Monday.    [provider]  fenofibrate (TRICOR) 145 MG tablet Take 145 mg by mouth daily. 10/24/19   [provider]  fluticasone-salmeterol (ADVAIR HFA) 115-21 MCG/ACT inhaler Inhale 2 puffs into the lungs daily.    [provider]  gabapentin (NEURONTIN) 300 MG capsule Take 2 capsules (600 mg total) by mouth 2 (two) times daily. 07/06/20 08/05/20  Azucena Fallen, MD   HUMALOG KWIKPEN 200 UNIT/ML KwikPen Inject 0-10 Units into the skin 3 (three) times daily with meals. 07/05/20   Azucena Fallen, MD  latanoprost (XALATAN) 0.005 % ophthalmic solution Place 1 drop into both eyes at bedtime.  07/04/20   [provider]  levETIRAcetam (KEPPRA) 250 MG tablet Take 3 tablets (750 mg total) by mouth 2 (two) times daily. 750 mg twice daily for a month, then on 08/25/20 take 500 mg twice daily for a month then 09/24/20 take 250 mg twice daily for a month then stop 07/25/20   Leatha Gilding, MD  metFORMIN (GLUCOPHAGE) 1000 MG tablet Take 1 tablet (1,000 mg total) by mouth  2 (two) times daily. 07/05/20   Azucena Fallen, MD  nortriptyline (PAMELOR) 50 MG capsule Take 100 mg by mouth at bedtime.     [provider]  pantoprazole (PROTONIX) 40 MG tablet Take 40 mg by mouth 2 (two) times daily. 07/03/20   [provider]  polyethylene glycol powder (GLYCOLAX/MIRALAX) 17 GM/SCOOP powder Take 17 g by mouth daily as needed for mild constipation. 12/31/15   [provider]  rizatriptan (MAXALT) 10 MG tablet Take 10 mg by mouth daily. May repeat in 2 hours if needed     [provider]  sertraline (ZOLOFT) 50 MG tablet Take 50 mg by mouth daily. 06/13/20   [provider]  SUMAtriptan (IMITREX) 100 MG tablet Take 1 tablet (100 mg total) by mouth daily as needed for migraine or headache. May repeat in 2 hours if headache persists or recurs. 07/25/20   Leatha Gilding, MD  timolol (TIMOPTIC) 0.5 % ophthalmic solution Place 1 drop into both eyes 2 (two) times daily.  04/06/20   [provider]  valsartan (DIOVAN) 320 MG tablet Take 320 mg by mouth daily. 07/04/20   [provider]  zonisamide (ZONEGRAN) 100 MG capsule Take 200 mg by mouth 2 (two) times daily.  05/17/20   [provider]    Physical Exam: Vitals:   09/02/20 1420 09/02/20 1429 09/02/20 1433  BP: 121/83  122/89  Pulse: 77  74  Resp: 20   17  Temp: 98.4 F (36.9 C)    TempSrc: Axillary    SpO2: 95%  95%  Weight:  86 kg   Height:   (1.702 m)     Constitutional: NAD, calm, comfortable Vitals:   09/02/20 1420 09/02/20 1429 09/02/20 1433  BP: 121/83  122/89  Pulse: 77  74  Resp: 20  17  Temp: 98.4 F (36.9 C)    TempSrc: Axillary    SpO2: 95%  95%  Weight:  86 kg   Height:   (1.702 m)    Eyes: PERRL, lids and conjunctivae normal ENMT: Mucous membranes are dry. Posterior pharynx clear of any exudate or lesions.Normal dentition.  Neck: normal, supple, no masses, no thyromegaly Respiratory: clear to auscultation bilaterally, no wheezing, no crackles. Normal respiratory effort. No accessory muscle use.  Cardiovascular: Regular rate and rhythm, no murmurs / rubs / gallops. No extremity edema. 2+ pedal pulses. No carotid bruits.  Abdomen: no tenderness, no masses palpated. No hepatosplenomegaly. Bowel sounds positive.  Musculoskeletal: no clubbing / cyanosis. No joint deformity upper and lower extremities. Good ROM, no contractures. Normal muscle tone.  Skin: no rashes, lesions, ulcers. No induration Neurologic: Left facial droop, tongue deviation to the left x1 but not consistent and not repeatable.  Left-sided light touch sensation decreased on the left side compared to the right, left sided arm and leg flaccid compared to muscle strength 5/5 on the right side.  Deep tendon reflex plus tricep and knee and ankle bilaterally.  Psychiatric: Awake but mumbling    Labs on Admission: I have personally reviewed following labs and imaging studies  CBC: Recent Labs  Lab 09/02/20 1355 09/02/20 1414  WBC 5.8  --   NEUTROABS 3.7  --   HGB 12.7* 13.6  HCT 42.2 40.0  MCV 91.3  --   PLT 166  --    Basic Metabolic Panel: Recent Labs  Lab 09/02/20 1355 09/02/20 1414  NA 143 146*  K 4.0 4.0  CL 112* 112*  CO2  19*  --   GLUCOSE 153* 145*  BUN 12 13  CREATININE 1.64* 1.60*  CALCIUM 9.8  --     GFR: Estimated Creatinine Clearance: 52.7 mL/min (A) (by C-G formula based on SCr of 1.6 mg/dL (H)). Liver Function Tests: Recent Labs  Lab 09/02/20 1355  AST 29  ALT 35  ALKPHOS 60  BILITOT 0.4  PROT 7.4  ALBUMIN 4.3   No results for input(s): LIPASE, AMYLASE in the last 168 hours. No results for input(s): AMMONIA in the last 168 hours. Coagulation Profile: Recent Labs  Lab 09/02/20 1355  INR 1.1   Cardiac Enzymes: No results for input(s): CKTOTAL, CKMB, CKMBINDEX, TROPONINI in the last 168 hours. BNP (last 3 results) No results for input(s): PROBNP in the last 8760 hours. HbA1C: No results for input(s): HGBA1C in the last 72 hours. CBG: Recent Labs  Lab 09/02/20 1354  GLUCAP 145*   Lipid Profile: No results for input(s): CHOL, HDL, LDLCALC, TRIG, CHOLHDL, LDLDIRECT in the last 72 hours. Thyroid Function Tests: No results for input(s): TSH, T4TOTAL, FREET4, T3FREE, THYROIDAB in the last 72 hours. Anemia Panel: No results for input(s): VITAMINB12, FOLATE, FERRITIN, TIBC, IRON, RETICCTPCT in the last 72 hours. Urine analysis:    Component Value Date/Time   COLORURINE YELLOW 05/03/2020 0250   APPEARANCEUR CLEAR 05/03/2020 0250   LABSPEC 1.021 05/03/2020 0250   PHURINE 5.0 05/03/2020 0250   GLUCOSEU 50 (A) 05/03/2020 0250   HGBUR NEGATIVE 05/03/2020 0250   BILIRUBINUR NEGATIVE 05/03/2020 0250   KETONESUR NEGATIVE 05/03/2020 0250   PROTEINUR NEGATIVE 05/03/2020 0250   UROBILINOGEN 0.2 08/29/2015 1125   NITRITE NEGATIVE 05/03/2020 0250   LEUKOCYTESUR NEGATIVE 05/03/2020 0250    Radiological Exams on Admission: DG Chest Portable 1 View  Result Date: 09/02/2020 CLINICAL DATA:  Left-sided chest pain EXAM: PORTABLE CHEST 1 VIEW COMPARISON:  08/14/2020 FINDINGS: The heart size and mediastinal contours are within normal limits. Both lungs are clear. The visualized skeletal structures are unremarkable. IMPRESSION: No active disease. Electronically Signed   By: Jasmine Pang M.D.   On: 09/02/2020 15:23   CT HEAD CODE STROKE WO CONTRAST  Result Date: 09/02/2020 CLINICAL DATA:  Code stroke. 59 year old male with left side weakness. EXAM: CT HEAD WITHOUT CONTRAST TECHNIQUE: Contiguous axial images were obtained from the base of the skull through the vertex without intravenous contrast. COMPARISON:  Brain MRI 07/22/2020 and earlier. FINDINGS: Brain: Gray-white matter differentiation appears stable and within normal limits. Asymmetric dystrophic calcification of the left basal ganglia again noted. No midline shift, ventriculomegaly, mass effect, evidence of mass lesion, intracranial hemorrhage or evidence of cortically based acute infarction. Partially empty sella again noted. Vascular: No suspicious intracranial vascular hyperdensity identified. Skull: No acute osseous abnormality identified. Sinuses/Orbits: Visualized paranasal sinuses and mastoids are clear. Other: No acute orbit or scalp soft tissue finding identified. ASPECTS [facility] Stroke Program Early CT Score) Total score (0-10 with 10 being normal): 10 IMPRESSION: 1. Stable and normal noncontrast CT appearance of the brain. ASPECTS 10. 2. These results were communicated to Dr. Darnelle Maffucci At 2:07 pm on 09/02/2020 by text page via the [facility] messaging system. Electronically Signed   By: Odessa Fleming M.D.   On: 09/02/2020 14:08   CT ANGIO HEAD CODE STROKE  Addendum Date: 09/02/2020   ADDENDUM REPORT: 09/02/2020 14:56 ADDENDUM: These results were called by telephone at the time of interpretation on 09/02/2020 at 2:56 pm to provider Dr. Criss Alvine, who verbally acknowledged these results. Electronically Signed   By:  Jackey Loge DO   On: 09/02/2020 14:56   Result Date: 09/02/2020 CLINICAL DATA:  Suspected stroke. Last known well 12 p.m., left arm and leg weakness. EXAM: CT ANGIOGRAPHY HEAD AND NECK TECHNIQUE: Multidetector CT imaging of the head and neck was performed using the standard protocol during bolus administration of  intravenous contrast. Multiplanar CT image reconstructions and MIPs were obtained to evaluate the vascular anatomy. Carotid stenosis measurements (when applicable) are obtained utilizing NASCET criteria, using the distal internal carotid diameter as the denominator. CONTRAST:  OMNIPAQUE IOHEXOL 350 MG/ML SOLN COMPARISON:  Noncontrast head CT performed earlier the same day 09/02/2020. Brain MRI 07/22/2020. CT angiogram head/neck 07/21/2020. FINDINGS: CTA NECK FINDINGS Aortic arch: Standard aortic branching. Minimal calcified plaque within the distal innominate/proximal right subclavian artery. No hemodynamically significant innominate or proximal subclavian artery stenosis. Right carotid system: CCA and ICA patent within the neck without stenosis. No significant atherosclerotic disease. Left carotid system: CCA and ICA patent within the neck without stenosis. No significant atherosclerotic disease. Vertebral arteries: The right vertebral artery is dominant. Portions of the V2 left vertebral artery are obscured by streak artifact arising from spinal fusion hardware. Within this limitation, the vertebral arteries are patent within the neck bilaterally without significant stenosis. Skeleton: No acute bony abnormality or aggressive osseous lesion. Sequela of prior fusion at the C4-C7 levels. Other neck: No neck mass or cervical lymphadenopathy. Upper chest: No consolidation within the imaged lung apices. Review of the MIP images confirms the above findings CTA HEAD FINDINGS Anterior circulation: The intracranial internal carotid arteries are patent. The M1 middle cerebral arteries are patent without significant stenosis. No M2 proximal branch occlusion is identified. Atherosclerotic irregularity of M2 and more distal MCA branch vessels bilaterally. Most notably, there has been apparent interval progression of a now high-grade focal stenosis within a proximal M2 right MCA branch vessel (series 9, image 15). The  anterior cerebral arteries are patent. No intracranial aneurysm is identified. Posterior circulation: The intracranial vertebral arteries are patent. The basilar artery is patent. The posterior cerebral arteries are patent. Apparent moderate/severe stenosis within the right posterior cerebral artery at the P3/P4 junction, not appreciated on the prior examination of 07/21/2020. (Series 10, image 56) (series 9, image 20). A right posterior communicating artery is present. A small left posterior communicating artery is likely present. Venous sinuses: Within limitations of contrast timing, no convincing thrombus. Anatomic variants: As described. Review of the MIP images confirms the above findings IMPRESSION: CTA neck: Streak artifact arising from cervical spinal fusion hardware partially obscures portions of the V2 left vertebral artery. Within this limitation, the common carotid, internal carotid and vertebral arteries are patent within the neck without evidence of hemodynamically significant stenosis. CTA head: 1. No intracranial large vessel occlusion. 2. Atherosclerotic irregularity of M2 and more distal MCA branch vessels bilaterally. Most notably, there is a progressive high-grade focal stenosis within a proximal M2 right MCA branch vessel. 3. Apparent moderate/severe focal stenosis within the right posterior cerebral artery at the P3/P4 junction, which appears new. Electronically Signed: By: Jackey Loge DO On: 09/02/2020 14:47   CT ANGIO NECK CODE STROKE  Addendum Date: 09/02/2020   ADDENDUM REPORT: 09/02/2020 14:56 ADDENDUM: These results were called by telephone at the time of interpretation on 09/02/2020 at 2:56 pm to provider Dr. Criss Alvine, who verbally acknowledged these results. Electronically Signed   By: Jackey Loge DO   On: 09/02/2020 14:56   Result Date: 09/02/2020 CLINICAL DATA:  Suspected stroke. Last known well  12 p.m., left arm and leg weakness. EXAM: CT ANGIOGRAPHY HEAD AND NECK TECHNIQUE:  Multidetector CT imaging of the head and neck was performed using the standard protocol during bolus administration of intravenous contrast. Multiplanar CT image reconstructions and MIPs were obtained to evaluate the vascular anatomy. Carotid stenosis measurements (when applicable) are obtained utilizing NASCET criteria, using the distal internal carotid diameter as the denominator. CONTRAST:  OMNIPAQUE IOHEXOL 350 MG/ML SOLN COMPARISON:  Noncontrast head CT performed earlier the same day 09/02/2020. Brain MRI 07/22/2020. CT angiogram head/neck 07/21/2020. FINDINGS: CTA NECK FINDINGS Aortic arch: Standard aortic branching. Minimal calcified plaque within the distal innominate/proximal right subclavian artery. No hemodynamically significant innominate or proximal subclavian artery stenosis. Right carotid system: CCA and ICA patent within the neck without stenosis. No significant atherosclerotic disease. Left carotid system: CCA and ICA patent within the neck without stenosis. No significant atherosclerotic disease. Vertebral arteries: The right vertebral artery is dominant. Portions of the V2 left vertebral artery are obscured by streak artifact arising from spinal fusion hardware. Within this limitation, the vertebral arteries are patent within the neck bilaterally without significant stenosis. Skeleton: No acute bony abnormality or aggressive osseous lesion. Sequela of prior fusion at the C4-C7 levels. Other neck: No neck mass or cervical lymphadenopathy. Upper chest: No consolidation within the imaged lung apices. Review of the MIP images confirms the above findings CTA HEAD FINDINGS Anterior circulation: The intracranial internal carotid arteries are patent. The M1 middle cerebral arteries are patent without significant stenosis. No M2 proximal branch occlusion is identified. Atherosclerotic irregularity of M2 and more distal MCA branch vessels bilaterally. Most notably, there has been apparent interval  progression of a now high-grade focal stenosis within a proximal M2 right MCA branch vessel (series 9, image 15). The anterior cerebral arteries are patent. No intracranial aneurysm is identified. Posterior circulation: The intracranial vertebral arteries are patent. The basilar artery is patent. The posterior cerebral arteries are patent. Apparent moderate/severe stenosis within the right posterior cerebral artery at the P3/P4 junction, not appreciated on the prior examination of 07/21/2020. (Series 10, image 56) (series 9, image 20). A right posterior communicating artery is present. A small left posterior communicating artery is likely present. Venous sinuses: Within limitations of contrast timing, no convincing thrombus. Anatomic variants: As described. Review of the MIP images confirms the above findings IMPRESSION: CTA neck: Streak artifact arising from cervical spinal fusion hardware partially obscures portions of the V2 left vertebral artery. Within this limitation, the common carotid, internal carotid and vertebral arteries are patent within the neck without evidence of hemodynamically significant stenosis. CTA head: 1. No intracranial large vessel occlusion. 2. Atherosclerotic irregularity of M2 and more distal MCA branch vessels bilaterally. Most notably, there is a progressive high-grade focal stenosis within a proximal M2 right MCA branch vessel. 3. Apparent moderate/severe focal stenosis within the right posterior cerebral artery at the P3/P4 junction, which appears new. Electronically Signed: By: Jackey Loge DO On: 09/02/2020 14:47    EKG: Independently reviewed.  Normal sinus rhythm, no acute ST-T changes  Assessment/Plan Active Problems:   CVA (cerebral vascular accident) (HCC)  (please populate well all problems here in Problem List. (For example, if patient is on BP meds at home and you resume or decide to hold them, it is a problem that needs to be her. Same for CAD, COPD, HLD and so  on)  CVA versus complex migraines -With a mixture of neurological symptoms involving headache double vision left-sided weakness and some cranial nerve  involvement also everything on physical exam is repeatable, agreed with neurology initial evaluation of complex migraines. -But CT angiogram showed evidence of newly developed narrowing on at least 2 locations of MCA and PCA.  Before today patient had at least 2 CT angiogram in May and August post report normal findings.  Will add beta blocker to help his migraines. -MRI pending -Sumatriptan subcu x1 and continue p.o. sumatriptan. -PT OT evaluation, and the rest as per CVA protocol -At this moment on considering psychiatry etiology.  AKI -Probably prerenal, IV fluid and recheck kidney function  Seizure disorder -Continue home dose of Keppra  IDDM -Sliding scale for now  HTN -Hold ARB, start Beta Blocker  DVT prophylaxis: Heparin subcu Code Status: Full code Family Communication: None at bedside Disposition Plan: Patient has had recurrent neurological issue with possible differential of complex migraines and CVA, expect more than 2 midnight hospital stay Consults called: Neurology Admission status: Telemetry admission   Emeline General MD [facility] Pager [phone]  09/02/2020, 4:15 PM

## 2020-09-02 NOTE — Code Documentation (Signed)
Stroke Response Nurse Documentation Code Documentation  DAMASCUS FELDPAUSCH is a 59 y.o. male arriving to [facility] ED via [facility] on 09/02/2020 with past medical hx of seizure work-up and potential syncope episodes. Code stroke was activated by ED. Patient from home where he was LKW at 1200 and now complaining of left sided weakness and slurred speech. Pt reports that he woke up normal and started to have a headache. He took Imitrex and then noted the headache got worse and he had left sided weakness with slurred speech. His exam is inconsistent, but the patient does complain of 10/10 headache.   Stroke team at the bedside on patient arrival. Labs drawn and patient cleared for CT by Dr. Lockie Mola. Patient to CT with team. NIHSS 12, see documentation for details and code stroke times. Patient with disoriented, not following commands, left facial droop, left arm weakness, left leg weakness, left decreased sensation and dysarthria  on exam. The following imaging was completed: CT, CTA head and neck. Patient is not a candidate for tPA due to stroke not suspected. Care/Plan: q2 hour mNIHSS/VS. Bedside handoff with ED RN April.    Lucila Maine  Stroke Response RN

## 2020-09-02 NOTE — Consult Note (Addendum)
Neurology Consultation  Reason for Consult: Left-sided weakness Referring Physician: Criss Alvine  CC: Left-sided weakness and dysarthria  History is obtained from: EMS  HPI: Nicholas [name] is a 59 y.o. 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  Per EMS patient was doing fine.  Patient states that he took his blood pressure and he noted it was within normal limits.  Suddenly he noted he felt a severe salt-like taste in his mouth.  He drank some water and the salt like taste did not go away status post drinking water he did note he had a throbbing headache which became worse.  Currently he has a 10/10 headache.  He does have a history of migraine headaches and did take Imitrex prior to coming to the hospital with no relief.  Currently also states that he has double vision.  However he states the double vision continues to be present when he covers 1 eye in the other eye.  It was due to the symptoms that patient was brought to the emergency department as a code stroke.  CT head, CTA of head and neck were also obtained.  ED course  Relevant labs include -creatinine 1.64 CT head shows-stable and normal noncontrast CT appearance with aspects of 10 CTA head neck-no intracranial large vessel occlusion.  Atherosclerotic irregularity at M2 and more distal MCA branch vessels bilaterally.  Most notably, there is progressive high-grade focal stenosis within the proximal M2 right MCA branch vessel.  LKW: 1200 tpa given?: no, inconsistencies in exam Premorbid modified Rankin scale (mRS): 0  NIHSS 1a Level of Conscious.:0  1b LOC Questions: 1 1c LOC Commands: 1 2 Best Gaze: 0 3 Visual: 0 4 Facial Palsy: 0 5a Motor Arm - left: 3 5b Motor Arm - Right: 0 6a Motor Leg - Left: 3 6b Motor Leg - Right: 0 7 Limb Ataxia: 0 8 Sensory: 1 9 Best Language: 0 10 Dysarthria: 1 11 Extinct. and Inatten.: 0 TOTAL: 10   Past Medical History:  Diagnosis Date  .  Asthma   . Bipolar 1 disorder (HCC)   . Borderline glaucoma   . Chronic pain   . Epididymal pain    LEFT  . 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 Feeling of incomplete bladder emptying   . Frequency of urination   . Gastric ulcer   . GERD (gastroesophageal reflux disease)   . Hypertension   . Hyperthyroidism    NO MEDS   . Migraine   . Seizures (HCC)   . TIA (transient ischemic attack)   . Type 2 diabetes mellitus (HCC)     Family History  Problem Relation Age of Onset  . Heart failure Mother   . Diabetes Mother   . Hypertension Mother   . Cirrhosis Father   . Heart failure Father   . Kidney disease Father    Social History:   reports that he quit smoking about 27 years ago. His smoking use included cigarettes. He has a 3.75 pack-year smoking history. He has never used smokeless tobacco. He reports that he does not drink alcohol and does not use drugs.  Medications  Current Facility-Administered Medications:  .  diphenhydrAMINE (BENADRYL) injection 25 mg, 25 mg, Intravenous, Once, Pricilla Loveless, MD .  lactated ringers bolus 1,000 mL, 1,000 mL, Intravenous, Once, Pricilla Loveless, MD .  prochlorperazine (COMPAZINE) injection 10 mg, 10 mg, Intravenous, Once, Pricilla Loveless, MD  Current Outpatient Medications:  .  acetaminophen (TYLENOL 8 HOUR) 650 MG CR tablet, Take 1 tablet (650 mg total) by mouth every 8 (eight) hours., Disp: 30 tablet, Rfl: 0 .  albuterol (PROVENTIL HFA;VENTOLIN HFA) 108 (90 Base) MCG/ACT inhaler, Inhale 1-2 puffs into the lungs every 6 (six) hours as needed for wheezing or shortness of breath., Disp: 1 Inhaler, Rfl: 0 .  atorvastatin (LIPITOR) 80 MG tablet, Take 80 mg by mouth at bedtime. , Disp: , Rfl:  .  budesonide-formoterol (SYMBICORT) 80-4.5 MCG/ACT inhaler, Inhale 2 puffs into the lungs daily., Disp: , Rfl:  .  clopidogrel (PLAVIX) 75 MG tablet, Take  1 tablet (75 mg total) by mouth daily., Disp: 30 tablet, Rfl: 01 .  ergocalciferol (VITAMIN D2) 1.25 MG (50000 UT) capsule, Take 50,000 Units by mouth every Monday., Disp: , Rfl:  .  fenofibrate (TRICOR) 145 MG tablet, Take 145 mg by mouth daily., Disp: , Rfl:  .  fluticasone-salmeterol (ADVAIR HFA) 115-21 MCG/ACT inhaler, Inhale 2 puffs into the lungs daily., Disp: , Rfl:  .  gabapentin (NEURONTIN) 300 MG capsule, Take 2 capsules (600 mg total) by mouth 2 (two) times daily., Disp: 90 capsule, Rfl: 01 .  HUMALOG KWIKPEN 200 UNIT/ML KwikPen, Inject 0-10 Units into the skin 3 (three) times daily with meals., Disp: 1 pen, Rfl: 01 .  latanoprost (XALATAN) 0.005 % ophthalmic solution, Place 1 drop into both eyes at bedtime. , Disp: , Rfl:  .  levETIRAcetam (KEPPRA) 250 MG tablet, Take 3 tablets (750 mg total) by mouth 2 (two) times daily. 750 mg twice daily for a month, then on 08/25/20 take 500 mg twice daily for a month then 09/24/20 take 250 mg twice daily for a month then stop, Disp: 180 tablet, Rfl: 1 .  metFORMIN (GLUCOPHAGE) 1000 MG tablet, Take 1 tablet (1,000 mg total) by mouth 2 (two) times daily., Disp: 60 tablet, Rfl: 01 .  nortriptyline (PAMELOR) 50 MG capsule, Take 100 mg by mouth at bedtime. , Disp: , Rfl:  .  pantoprazole (PROTONIX) 40 MG tablet, Take 40 mg by mouth 2 (two) times daily., Disp: , Rfl:  .  polyethylene glycol powder (GLYCOLAX/MIRALAX) 17 GM/SCOOP powder, Take 17 g by mouth daily as needed for mild constipation., Disp: , Rfl:  .  rizatriptan (MAXALT) 10 MG tablet, Take 10 mg by mouth daily. May repeat in 2 hours if needed , Disp: , Rfl:  .  sertraline (ZOLOFT) 50 MG tablet, Take 50 mg by mouth daily., Disp: , Rfl:  .  SUMAtriptan (IMITREX) 100 MG tablet, Take 1 tablet (100 mg total) by mouth daily as needed for migraine or headache. May repeat in 2 hours if headache persists or recurs., Disp: 20 tablet, Rfl: 0 .  timolol (TIMOPTIC) 0.5 % ophthalmic solution, Place 1 drop into  both eyes 2 (two) times daily. , Disp: , Rfl:  .  valsartan (DIOVAN) 320 MG tablet, Take 320 mg by mouth daily., Disp: , Rfl:  .  zonisamide (ZONEGRAN) 100 MG capsule, Take 200 mg by mouth 2 (two) times daily. , Disp: , Rfl:   ROS:   General ROS: negative for - chills, fatigue, fever, night sweats, weight gain or weight loss Psychological ROS: negative for - behavioral disorder, hallucinations, memory difficulties, mood swings or suicidal ideation Ophthalmic ROS: Positive for - blurry vision, double vision,  ENT ROS: negative for - epistaxis, nasal discharge, oral lesions, sore  throat, tinnitus or vertigo Allergy and Immunology ROS: negative for - hives or itchy/watery eyes Hematological and Lymphatic ROS: negative for - bleeding problems, bruising or swollen lymph nodes Endocrine ROS: negative for - galactorrhea, hair pattern changes, polydipsia/polyuria or temperature intolerance Respiratory ROS: Positive for -shortness of breath Cardiovascular ROS: Positive for - chest pain Gastrointestinal ROS: negative for - abdominal pain, diarrhea, hematemesis, nausea/vomiting or stool incontinence Genito-Urinary ROS: negative for - dysuria, hematuria, incontinence or urinary frequency/urgency Musculoskeletal ROS: Positive for -muscular weakness Neurological ROS: as noted in HPI Dermatological ROS: negative for rash and skin lesion changes  Exam: Current vital signs: BP 122/89   Pulse 74   Temp 98.4 F (36.9 C) (Axillary)   Resp 17   Ht 5\' 7"  (1.702 m)   Wt 86 kg   SpO2 95%   BMI 29.69 kg/m  Vital signs in last 24 hours: Temp:  [98.4 F (36.9 C)] 98.4 F (36.9 C) (09/27 1420) Pulse Rate:  [74-77] 74 (09/27 1433) Resp:  [17-20] 17 (09/27 1433) BP: (121-122)/(83-89) 122/89 (09/27 1433) SpO2:  [95 %] 95 % (09/27 1433) Weight:  [86 kg] 86 kg (09/27 1429)   Constitutional: Appears well-developed and well-nourished.  Psych: Affect appropriate to situation Eyes: No scleral  injection HENT: No OP obstrucion Head: Normocephalic.  Cardiovascular: Normal rate and regular rhythm.  Respiratory: Effort normal, non-labored breathing GI: Soft.  No distension. There is no tenderness.  Skin: WDI  Neuro: Mental Status: Patient is awake, alert, oriented to person, place, patient has significant dysarthria which is very hard to ascertain what he is saying at times.  There is also periods of time which he shows nonfunctional speech-high-pitched almost baby speech.  Patient has no difficulty understanding and following commands nor expressing himself. At times when asking patient question he gives me a stare but then will start talking spontaneously Cranial Nerves: II: Patient complains of monocular diplopia III,IV, VI: EOMI without ptosis or diploplia. Pupils equal, round and reactive to light V: Facial sensation decreased on the left VII: Decreased on the left however when asked to smile he purposely pulls up the right aspect at the corner of his mouth VIII: hearing is intact to voice X: Palat elevates symmetrically XI: Shoulder shrug is symmetric. XII: tongue is midline without atrophy or fasciculations.  Motor: Normal antigravity movement on the right arm and leg.  Left arm and leg allows to fall to the bed Sensory: Decreased along left arm and leg Deep Tendon Reflexes: 2+ and symmetric in the biceps and patellae.  Plantars: Toes are downgoing bilaterally.  Cerebellar: FNF and HKS are intact bilaterally  Labs I have reviewed labs in epic and the results pertinent to this consultation are:  CBC    Component Value Date/Time   WBC 5.8 09/02/2020 1355   RBC 4.62 09/02/2020 1355   HGB 13.6 09/02/2020 1414   HGB 13.7 12/31/2014 2304   HCT 40.0 09/02/2020 1414   HCT 41.9 12/31/2014 2304   PLT 166 09/02/2020 1355   PLT 150 12/31/2014 2304   MCV 91.3 09/02/2020 1355   MCV 85 12/31/2014 2304   MCH 27.5 09/02/2020 1355   MCHC 30.1 09/02/2020 1355   RDW 15.7  (H) 09/02/2020 1355   RDW 15.7 (H) 12/31/2014 2304   LYMPHSABS 1.5 09/02/2020 1355   MONOABS 0.4 09/02/2020 1355   EOSABS 0.1 09/02/2020 1355   BASOSABS 0.0 09/02/2020 1355    CMP     Component Value Date/Time   NA 146 (H) 09/02/2020  1414   NA 143 12/31/2014 2304   K 4.0 09/02/2020 1414   K 3.8 12/31/2014 2304   CL 112 (H) 09/02/2020 1414   CL 113 (H) 12/31/2014 2304   CO2 19 (L) 09/02/2020 1355   CO2 20 (L) 12/31/2014 2304   GLUCOSE 145 (H) 09/02/2020 1414   GLUCOSE 104 (H) 12/31/2014 2304   BUN 13 09/02/2020 1414   BUN 19 (H) 12/31/2014 2304   CREATININE 1.60 (H) 09/02/2020 1414   CREATININE 1.19 12/31/2014 2304   CALCIUM 9.8 09/02/2020 1355   CALCIUM 9.5 12/31/2014 2304   PROT 7.4 09/02/2020 1355   PROT 7.3 12/31/2014 2304   ALBUMIN 4.3 09/02/2020 1355   ALBUMIN 3.8 12/31/2014 2304   AST 29 09/02/2020 1355   AST 24 12/31/2014 2304   ALT 35 09/02/2020 1355   ALT 36 12/31/2014 2304   ALKPHOS 60 09/02/2020 1355   ALKPHOS 52 12/31/2014 2304   BILITOT 0.4 09/02/2020 1355   BILITOT 0.2 12/31/2014 2304   GFRNONAA 45 (L) 09/02/2020 1355   GFRNONAA >60 12/31/2014 2304   GFRAA 53 (L) 09/02/2020 1355   GFRAA >60 12/31/2014 2304    Lipid Panel     Component Value Date/Time   CHOL 143 07/03/2017 1511   TRIG 270 (H) 07/03/2017 1511   HDL 29 (L) 07/03/2017 1511   CHOLHDL 4.9 07/03/2017 1511   VLDL 54 (H) 07/03/2017 1511   LDLCALC 60 07/03/2017 1511     Imaging I have reviewed the images obtained:  CT head shows-stable and normal noncontrast CT appearance with aspects of 10 CTA head neck-no intracranial large vessel occlusion.  Atherosclerotic irregularity at M2 and more distal MCA branch vessels bilaterally.  Most notably, there is progressive high-grade focal stenosis within the proximal M2 right MCA branch vessel.  Felicie Mornavid Smith PA-C [facility] [phone]  M-F  (9:00 am- 5:00 PM)  09/02/2020, 2:45 PM     Assessment:  This is a 59 year old  male presenting to the hospital secondary to having sudden onset of decreased heavy sensation on the left aspect of his body.  Patient states that after this episode he noted a salt-like taste in his mouth followed by severe headache.  As noted above he does take Imitrex and did take a Imitrex prior to coming to the hospital.  Currently states he has a 10/10 headache.  Exam does show functional speech, and inconsistent motor exam.  He also states he is having monocular diplopia.  Impression: -Given the subjective history of abnormal taste in his mouth followed by severe headache and left-sided deficit as far as sensation and weakness, and the fact he does have migraine headaches this most likely represents a complicated migraine. -We will obtain MRI as cannot fully rule out stroke as he does have multiple stroke risk factors -Although patient does have history of seizure versus pseudoseizure I do not believe that this represented a seizure activity as there was no seizure like activity noted during the symptoms.  Recommendations: -ED to give migraine cocktail -MRI without contrast -At this point I do not feel there is need for EEG unless patient shows seizure-like activity -Admit patient to hospital per hospitalist as patient has gait instability at this time -Fall precautions   Patient was seen and examined with Andres Labrumave Smith PA on 09/02/2020.  The patient denied convulsive activity and exam was inconsistent with functional speech pattern. 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 At this time he will only need MR imaging  to rule out other intracranial pathology, though less likely.

## 2020-09-02 NOTE — ED Notes (Addendum)
Medications that are not given at this time, are not verified by pharmacy and RN is unable to give without verification. Pharmacy tech has already seen pt.

## 2020-09-02 NOTE — ED Notes (Signed)
Dr Chipper Herb at bedside to assess due to continued somnolence and pinpoint pupils. Due to patient being arousable and lack of respiratory depression no narcan at this time, hold off on swallow screen

## 2020-09-02 NOTE — ED Provider Notes (Signed)
[name] EMERGENCY DEPARTMENT Provider Note   CSN: [id number] Arrival date & time: 09/02/20  1350  An emergency department physician performed an initial assessment on this suspected stroke patient at 1354.  History Chief Complaint  Patient presents with  . Code Stroke    Nicholas [name] is a 59 y.o. male.  HPI 59 year old male presents as a code stroke.  EMS provides the initial history which involves the patient not feeling well and feel like he had a blood pressure problem.  He was sweaty on first EMS arrival and had a low blood pressure of around 90.  He then started having difficulty with speech and left-sided weakness.  However he also seem to moved to pain in his left side when EMS started an IV.  Patient tells me he felt like his blood pressure went low and it was around 89.  Was feeling dizzy and had a right-sided headache and then when EMS arrived noticed left-sided weakness.  Some left-sided chest pain as well.   Past Medical History:  Diagnosis Date  . Asthma   . Bipolar 1 disorder (HCC)   . Borderline glaucoma   . Chronic pain   . Epididymal pain    LEFT  . 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 Feeling of incomplete bladder emptying   . Frequency of urination   . Gastric ulcer   . GERD (gastroesophageal reflux disease)   . Hypertension   . Hyperthyroidism    NO MEDS   . Migraine   . Seizures (HCC)   . TIA (transient ischemic attack)   . Type 2 diabetes mellitus [facility])     Patient Active Problem List   Diagnosis Date Noted  . CVA (cerebral vascular accident) (HCC) 09/02/2020  . Altered mental status 07/23/2020  . Seizure (HCC) 07/22/2020  . Left-sided weakness 07/04/2020  . Dysarthria 07/04/2020  . Essential hypertension   . Seizure disorder (HCC) 07/02/2017  . Insulin-requiring or dependent type II diabetes mellitus (HCC) 07/02/2017  .  CKD (chronic kidney disease), stage III 07/02/2017  . Normocytic anemia 07/02/2017  . Testicular/scrotal pain 11/09/2013  . Microhematuria 11/09/2013  . Condyloma acuminatum of scrotum 11/09/2013    Past Surgical History:  Procedure Laterality Date  . ABDOMINAL SURGERY    . ANTERIOR CERVICAL DECOMP/DISCECTOMY FUSION  2007   C4  --  C6  . CERVICAL FUSION    . CHOLECYSTECTOMY    . CYSTOSCOPY N/A 11/09/2013   Procedure: CYSTOSCOPY FLEXIBLE;  Surgeon: Bjorn Pippin, MD;  Location: [facility];  Service: Urology;  Laterality: N/A;  . EPIDIDYMECTOMY Left 11/09/2013   Procedure:  LEFT EPIDIDYMECTOMY;  Surgeon: Bjorn Pippin, MD;  Location: [facility];  Service: Urology;  Laterality: Left;  POSSIBLE OUTPATIENT WITH OBSERVATION  . EXCISION LIPOMA LEFT SHOULDER  2004 (APPROX)  . MULTIPLE CYST REMOVED FROM CHEST  AGE 5  . OTHER SURGICAL HISTORY     hemorroid surgery   . TESTICLE REMOVAL Left   . TONSILLECTOMY         Family History  Problem Relation Age of Onset  . Heart failure Mother   . Diabetes Mother   . Hypertension Mother   . Cirrhosis Father   . Heart failure Father   . Kidney disease Father     Social History   Tobacco Use  .  Smoking status: Former Smoker    Packs/day: 0.25    Years: 15.00    Pack years: 3.75    Types: Cigarettes    Quit date: 11/08/1992    Years since quitting: 27.8  . Smokeless tobacco: Never Used  Vaping Use  . Vaping Use: Never used  Substance Use Topics  . Alcohol use: No  . Drug use: No    Home Medications Prior to Admission medications   Medication Sig Start Date End Date Taking? Authorizing Provider  acetaminophen (TYLENOL 8 HOUR) 650 MG CR tablet Take 1 tablet (650 mg total) by mouth every 8 (eight) hours. 05/09/19   Derwood KaplanNanavati, Ankit, MD  albuterol (PROVENTIL HFA;VENTOLIN HFA) 108 (90 Base) MCG/ACT inhaler Inhale 1-2 puffs into the lungs every 6 (six) hours as needed for wheezing or shortness of breath. 11/15/18    Rancour, Jeannett SeniorStephen, MD  atorvastatin (LIPITOR) 80 MG tablet Take 80 mg by mouth at bedtime.     [provider]  budesonide-formoterol (SYMBICORT) 80-4.5 MCG/ACT inhaler Inhale 2 puffs into the lungs daily.    [provider]  clopidogrel (PLAVIX) 75 MG tablet Take 1 tablet (75 mg total) by mouth daily. 07/05/20   Azucena FallenLancaster, William C, MD  ergocalciferol (VITAMIN D2) 1.25 MG (50000 UT) capsule Take 50,000 Units by mouth every Monday.    [provider]  fenofibrate (TRICOR) 145 MG tablet Take 145 mg by mouth daily. 10/24/19   [provider]  fluticasone-salmeterol (ADVAIR HFA) 115-21 MCG/ACT inhaler Inhale 2 puffs into the lungs daily.    [provider]  gabapentin (NEURONTIN) 300 MG capsule Take 2 capsules (600 mg total) by mouth 2 (two) times daily. 07/06/20 08/05/20  Azucena FallenLancaster, William C, MD  HUMALOG KWIKPEN 200 UNIT/ML KwikPen Inject 0-10 Units into the skin 3 (three) times daily with meals. 07/05/20   Azucena FallenLancaster, William C, MD  latanoprost (XALATAN) 0.005 % ophthalmic solution Place 1 drop into both eyes at bedtime.  07/04/20   [provider]  levETIRAcetam (KEPPRA) 250 MG tablet Take 3 tablets (750 mg total) by mouth 2 (two) times daily. 750 mg twice daily for a month, then on 08/25/20 take 500 mg twice daily for a month then 09/24/20 take 250 mg twice daily for a month then stop 07/25/20   Leatha GildingGherghe, Costin M, MD  metFORMIN (GLUCOPHAGE) 1000 MG tablet Take 1 tablet (1,000 mg total) by mouth 2 (two) times daily. 07/05/20   Azucena FallenLancaster, William C, MD  nortriptyline (PAMELOR) 50 MG capsule Take 100 mg by mouth at bedtime.     [provider]  pantoprazole (PROTONIX) 40 MG tablet Take 40 mg by mouth 2 (two) times daily. 07/03/20   [provider]  polyethylene glycol powder (GLYCOLAX/MIRALAX) 17 GM/SCOOP powder Take 17 g by mouth daily as needed for mild constipation. 12/31/15   [provider]  rizatriptan (MAXALT) 10 MG tablet  Take 10 mg by mouth daily. May repeat in 2 hours if needed     [provider]  sertraline (ZOLOFT) 50 MG tablet Take 50 mg by mouth daily. 06/13/20   [provider]  SUMAtriptan (IMITREX) 100 MG tablet Take 1 tablet (100 mg total) by mouth daily as needed for migraine or headache. May repeat in 2 hours if headache persists or recurs. 07/25/20   Leatha GildingGherghe, Costin M, MD  timolol (TIMOPTIC) 0.5 % ophthalmic solution Place 1 drop into both eyes 2 (two) times daily.  04/06/20   [provider]  valsartan (DIOVAN) 320 MG  tablet Take 320 mg by mouth daily. 07/04/20   [provider]  zonisamide (ZONEGRAN) 100 MG capsule Take 200 mg by mouth 2 (two) times daily.  05/17/20   [provider]    Allergies    Levothyroxine, Nsaids, Amoxicillin, Ampicillin, Penicillins, Strawberry extract, Asa [aspirin], Bactrim [sulfamethoxazole-trimethoprim], Depakote [divalproex sodium], Dilantin [phenytoin], Methocarbamol, Risperidone and related, Sitagliptin, Strawberry (diagnostic), Sulfa antibiotics, Tolmetin, Ultram [tramadol hcl], and Oatmeal  Review of Systems   Review of Systems  Cardiovascular: Positive for chest pain.  Neurological: Positive for weakness, numbness and headaches.  All other systems reviewed and are negative.   Physical Exam Updated Vital Signs BP 122/89   Pulse 74   Temp 98.4 F (36.9 C) (Axillary)   Resp 17   Ht 5\' 7"  (1.702 m)   Wt 86 kg   SpO2 95%   BMI 29.69 kg/m   Physical Exam Vitals and nursing note reviewed.  Constitutional:      General: He is not in acute distress.    Appearance: He is well-developed. He is not ill-appearing or diaphoretic.  HENT:     Head: Normocephalic and atraumatic.     Right Ear: External ear normal.     Left Ear: External ear normal.     Nose: Nose normal.  Eyes:     General:        Right eye: No discharge.        Left eye: No discharge.     Extraocular Movements: Extraocular movements intact.      Pupils: Pupils are equal, round, and reactive to light.  Cardiovascular:     Rate and Rhythm: Normal rate and regular rhythm.     Heart sounds: Normal heart sounds.  Pulmonary:     Effort: Pulmonary effort is normal.     Breath sounds: Normal breath sounds.  Abdominal:     Palpations: Abdomen is soft.     Tenderness: There is no abdominal tenderness.  Musculoskeletal:     Cervical back: Neck supple.  Skin:    General: Skin is warm and dry.  Neurological:     Mental Status: He is alert.     Comments: Patient has slurred speech which is difficult to understand but can often understand what he is saying.  There is no facial droop at baseline but when he smiles only his right side moves.  5/5 strength in the right extremities.  He cannot lift the left leg off the stretcher and the left arm seems to be weak though when I let go of his arm it hangs in the air for about a second or 2 before he drops it.  Psychiatric:        Mood and Affect: Mood is not anxious.     ED Results / Procedures / Treatments   Labs (all labs ordered are listed, but only abnormal results are displayed) Labs Reviewed  CBC - Abnormal; Notable for the following components:      Result Value   Hemoglobin 12.7 (*)    RDW 15.7 (*)    All other components within normal limits  COMPREHENSIVE METABOLIC PANEL - Abnormal; Notable for the following components:   Chloride 112 (*)    CO2 19 (*)    Glucose, Bld 153 (*)    Creatinine, Ser 1.64 (*)    GFR calc non Af Amer 45 (*)    GFR calc Af Amer 53 (*)    All other components within normal limits  CBG MONITORING,  ED - Abnormal; Notable for the following components:   Glucose-Capillary 145 (*)    All other components within normal limits  I-STAT CHEM 8, ED - Abnormal; Notable for the following components:   Sodium 146 (*)    Chloride 112 (*)    Creatinine, Ser 1.60 (*)    Glucose, Bld 145 (*)    TCO2 19 (*)    All other components within normal limits   RESPIRATORY PANEL BY RT PCR (FLU A&B, COVID)  ETHANOL  PROTIME-INR  APTT  DIFFERENTIAL  RAPID URINE DRUG SCREEN, HOSP PERFORMED  URINALYSIS, ROUTINE W REFLEX MICROSCOPIC  TROPONIN I (HIGH SENSITIVITY)    EKG None  Radiology DG Chest Portable 1 View  Result Date: 09/02/2020 CLINICAL DATA:  Left-sided chest pain EXAM: PORTABLE CHEST 1 VIEW COMPARISON:  08/14/2020 FINDINGS: The heart size and mediastinal contours are within normal limits. Both lungs are clear. The visualized skeletal structures are unremarkable. IMPRESSION: No active disease. Electronically Signed   By: Jasmine Pang M.D.   On: 09/02/2020 15:23   CT HEAD CODE STROKE WO CONTRAST  Result Date: 09/02/2020 CLINICAL DATA:  Code stroke. 59 year old male with left side weakness. EXAM: CT HEAD WITHOUT CONTRAST TECHNIQUE: Contiguous axial images were obtained from the base of the skull through the vertex without intravenous contrast. COMPARISON:  Brain MRI 07/22/2020 and earlier. FINDINGS: Brain: Gray-white matter differentiation appears stable and within normal limits. Asymmetric dystrophic calcification of the left basal ganglia again noted. No midline shift, ventriculomegaly, mass effect, evidence of mass lesion, intracranial hemorrhage or evidence of cortically based acute infarction. Partially empty sella again noted. Vascular: No suspicious intracranial vascular hyperdensity identified. Skull: No acute osseous abnormality identified. Sinuses/Orbits: Visualized paranasal sinuses and mastoids are clear. Other: No acute orbit or scalp soft tissue finding identified. ASPECTS [facility] Stroke Program Early CT Score) Total score (0-10 with 10 being normal): 10 IMPRESSION: 1. Stable and normal noncontrast CT appearance of the brain. ASPECTS 10. 2. These results were communicated to Dr. Darnelle Maffucci At 2:07 pm on 09/02/2020 by text page via the [facility] messaging system. Electronically Signed   By: Odessa Fleming M.D.   On: 09/02/2020 14:08   CT ANGIO  HEAD CODE STROKE  Addendum Date: 09/02/2020   ADDENDUM REPORT: 09/02/2020 14:56 ADDENDUM: These results were called by telephone at the time of interpretation on 09/02/2020 at 2:56 pm to provider Dr. Criss Alvine, who verbally acknowledged these results. Electronically Signed   By: Jackey Loge DO   On: 09/02/2020 14:56   Result Date: 09/02/2020 CLINICAL DATA:  Suspected stroke. Last known well 12 p.m., left arm and leg weakness. EXAM: CT ANGIOGRAPHY HEAD AND NECK TECHNIQUE: Multidetector CT imaging of the head and neck was performed using the standard protocol during bolus administration of intravenous contrast. Multiplanar CT image reconstructions and MIPs were obtained to evaluate the vascular anatomy. Carotid stenosis measurements (when applicable) are obtained utilizing NASCET criteria, using the distal internal carotid diameter as the denominator. CONTRAST:  OMNIPAQUE IOHEXOL 350 MG/ML SOLN COMPARISON:  Noncontrast head CT performed earlier the same day 09/02/2020. Brain MRI 07/22/2020. CT angiogram head/neck 07/21/2020. FINDINGS: CTA NECK FINDINGS Aortic arch: Standard aortic branching. Minimal calcified plaque within the distal innominate/proximal right subclavian artery. No hemodynamically significant innominate or proximal subclavian artery stenosis. Right carotid system: CCA and ICA patent within the neck without stenosis. No significant atherosclerotic disease. Left carotid system: CCA and ICA patent within the neck without stenosis. No significant atherosclerotic disease. Vertebral arteries: The right vertebral  artery is dominant. Portions of the V2 left vertebral artery are obscured by streak artifact arising from spinal fusion hardware. Within this limitation, the vertebral arteries are patent within the neck bilaterally without significant stenosis. Skeleton: No acute bony abnormality or aggressive osseous lesion. Sequela of prior fusion at the C4-C7 levels. Other neck: No neck mass or cervical  lymphadenopathy. Upper chest: No consolidation within the imaged lung apices. Review of the MIP images confirms the above findings CTA HEAD FINDINGS Anterior circulation: The intracranial internal carotid arteries are patent. The M1 middle cerebral arteries are patent without significant stenosis. No M2 proximal branch occlusion is identified. Atherosclerotic irregularity of M2 and more distal MCA branch vessels bilaterally. Most notably, there has been apparent interval progression of a now high-grade focal stenosis within a proximal M2 right MCA branch vessel (series 9, image 15). The anterior cerebral arteries are patent. No intracranial aneurysm is identified. Posterior circulation: The intracranial vertebral arteries are patent. The basilar artery is patent. The posterior cerebral arteries are patent. Apparent moderate/severe stenosis within the right posterior cerebral artery at the P3/P4 junction, not appreciated on the prior examination of 07/21/2020. (Series 10, image 56) (series 9, image 20). A right posterior communicating artery is present. A small left posterior communicating artery is likely present. Venous sinuses: Within limitations of contrast timing, no convincing thrombus. Anatomic variants: As described. Review of the MIP images confirms the above findings IMPRESSION: CTA neck: Streak artifact arising from cervical spinal fusion hardware partially obscures portions of the V2 left vertebral artery. Within this limitation, the common carotid, internal carotid and vertebral arteries are patent within the neck without evidence of hemodynamically significant stenosis. CTA head: 1. No intracranial large vessel occlusion. 2. Atherosclerotic irregularity of M2 and more distal MCA branch vessels bilaterally. Most notably, there is a progressive high-grade focal stenosis within a proximal M2 right MCA branch vessel. 3. Apparent moderate/severe focal stenosis within the right posterior cerebral artery at  the P3/P4 junction, which appears new. Electronically Signed: By: Jackey Loge DO On: 09/02/2020 14:47   CT ANGIO NECK CODE STROKE  Addendum Date: 09/02/2020   ADDENDUM REPORT: 09/02/2020 14:56 ADDENDUM: These results were called by telephone at the time of interpretation on 09/02/2020 at 2:56 pm to provider Dr. Criss Alvine, who verbally acknowledged these results. Electronically Signed   By: Jackey Loge DO   On: 09/02/2020 14:56   Result Date: 09/02/2020 CLINICAL DATA:  Suspected stroke. Last known well 12 p.m., left arm and leg weakness. EXAM: CT ANGIOGRAPHY HEAD AND NECK TECHNIQUE: Multidetector CT imaging of the head and neck was performed using the standard protocol during bolus administration of intravenous contrast. Multiplanar CT image reconstructions and MIPs were obtained to evaluate the vascular anatomy. Carotid stenosis measurements (when applicable) are obtained utilizing NASCET criteria, using the distal internal carotid diameter as the denominator. CONTRAST:  OMNIPAQUE IOHEXOL 350 MG/ML SOLN COMPARISON:  Noncontrast head CT performed earlier the same day 09/02/2020. Brain MRI 07/22/2020. CT angiogram head/neck 07/21/2020. FINDINGS: CTA NECK FINDINGS Aortic arch: Standard aortic branching. Minimal calcified plaque within the distal innominate/proximal right subclavian artery. No hemodynamically significant innominate or proximal subclavian artery stenosis. Right carotid system: CCA and ICA patent within the neck without stenosis. No significant atherosclerotic disease. Left carotid system: CCA and ICA patent within the neck without stenosis. No significant atherosclerotic disease. Vertebral arteries: The right vertebral artery is dominant. Portions of the V2 left vertebral artery are obscured by streak artifact arising from spinal fusion hardware. Within this  limitation, the vertebral arteries are patent within the neck bilaterally without significant stenosis. Skeleton: No acute bony  abnormality or aggressive osseous lesion. Sequela of prior fusion at the C4-C7 levels. Other neck: No neck mass or cervical lymphadenopathy. Upper chest: No consolidation within the imaged lung apices. Review of the MIP images confirms the above findings CTA HEAD FINDINGS Anterior circulation: The intracranial internal carotid arteries are patent. The M1 middle cerebral arteries are patent without significant stenosis. No M2 proximal branch occlusion is identified. Atherosclerotic irregularity of M2 and more distal MCA branch vessels bilaterally. Most notably, there has been apparent interval progression of a now high-grade focal stenosis within a proximal M2 right MCA branch vessel (series 9, image 15). The anterior cerebral arteries are patent. No intracranial aneurysm is identified. Posterior circulation: The intracranial vertebral arteries are patent. The basilar artery is patent. The posterior cerebral arteries are patent. Apparent moderate/severe stenosis within the right posterior cerebral artery at the P3/P4 junction, not appreciated on the prior examination of 07/21/2020. (Series 10, image 56) (series 9, image 20). A right posterior communicating artery is present. A small left posterior communicating artery is likely present. Venous sinuses: Within limitations of contrast timing, no convincing thrombus. Anatomic variants: As described. Review of the MIP images confirms the above findings IMPRESSION: CTA neck: Streak artifact arising from cervical spinal fusion hardware partially obscures portions of the V2 left vertebral artery. Within this limitation, the common carotid, internal carotid and vertebral arteries are patent within the neck without evidence of hemodynamically significant stenosis. CTA head: 1. No intracranial large vessel occlusion. 2. Atherosclerotic irregularity of M2 and more distal MCA branch vessels bilaterally. Most notably, there is a progressive high-grade focal stenosis within a  proximal M2 right MCA branch vessel. 3. Apparent moderate/severe focal stenosis within the right posterior cerebral artery at the P3/P4 junction, which appears new. Electronically Signed: By: Jackey Loge DO On: 09/02/2020 14:47    Procedures Procedures (including critical care time)  Medications Ordered in ED Medications  iohexol (OMNIPAQUE) 350 MG/ML injection 100 mL (100 mLs Intravenous Contrast Given 09/02/20 1407)  prochlorperazine (COMPAZINE) injection 10 mg (10 mg Intravenous Given 09/02/20 1520)  diphenhydrAMINE (BENADRYL) injection 25 mg (25 mg Intravenous Given 09/02/20 1519)  lactated ringers bolus 1,000 mL (1,000 mLs Intravenous New Bag/Given 09/02/20 1523)    ED Course  I have reviewed the triage vital signs and the nursing notes.  Pertinent labs & imaging results that were available during my care of the patient were reviewed by me and considered in my medical decision making (see chart for details).    MDM Rules/Calculators/A&P                          Patient's work-up so far shows no obvious findings on Noncon head CT.  However his CT angio does show worsening stenosis that could correlate to his current symptoms.  I wonder if he got hypotensive causing asymmetric decreased blood flow and thus some weakness though his exam is quite inconsistent.  Neurology is recommending MRI and admit.  Hospitalist to admit. Final Clinical Impression(s) / ED Diagnoses Final diagnoses:  Left-sided weakness    Rx / DC Orders ED Discharge Orders    None       Pricilla Loveless, MD 09/02/20 1554

## 2020-09-02 NOTE — ED Triage Notes (Addendum)
Pt coming from home via EMS with c/o sudden left sided weakness, slurred speech starting at 12:00. Code stroke activated en route. Pt alert on arrival to ED. Assessed and cleared by EDP, transported to CT.

## 2020-09-03 ENCOUNTER — Inpatient Hospital Stay (HOSPITAL_COMMUNITY): Payer: Medicare Other

## 2020-09-03 DIAGNOSIS — R072 Precordial pain: Secondary | ICD-10-CM

## 2020-09-03 DIAGNOSIS — I6389 Other cerebral infarction: Secondary | ICD-10-CM

## 2020-09-03 LAB — URINALYSIS, ROUTINE W REFLEX MICROSCOPIC
Bacteria, UA: NONE SEEN
Bilirubin Urine: NEGATIVE
Glucose, UA: >=500 mg/dL — AB
Hgb urine dipstick: NEGATIVE
Ketones, ur: NEGATIVE mg/dL
Leukocytes,Ua: NEGATIVE
Nitrite: NEGATIVE
Protein, ur: NEGATIVE mg/dL
Specific Gravity, Urine: 1.008 (ref 1.005–1.030)
pH: 5 (ref 5.0–8.0)

## 2020-09-03 LAB — GLUCOSE, CAPILLARY: Glucose-Capillary: 180 mg/dL — ABNORMAL HIGH (ref 70–99)

## 2020-09-03 LAB — BASIC METABOLIC PANEL
Anion gap: 9 (ref 5–15)
BUN: 15 mg/dL (ref 6–20)
CO2: 20 mmol/L — ABNORMAL LOW (ref 22–32)
Calcium: 9 mg/dL (ref 8.9–10.3)
Chloride: 109 mmol/L (ref 98–111)
Creatinine, Ser: 1.4 mg/dL — ABNORMAL HIGH (ref 0.61–1.24)
GFR calc Af Amer: >60 mL/min (ref >60)
GFR calc non Af Amer: 55 mL/min — ABNORMAL LOW (ref >60)
Glucose, Bld: 185 mg/dL — ABNORMAL HIGH (ref 70–99)
Potassium: 3.9 mmol/L (ref 3.5–5.1)
Sodium: 138 mmol/L (ref 135–145)

## 2020-09-03 LAB — LIPID PANEL
Cholesterol: 79 mg/dL (ref 0–200)
HDL: 25 mg/dL — ABNORMAL LOW (ref >40)
LDL Cholesterol: 34 mg/dL (ref 0–99)
Total CHOL/HDL Ratio: 3.2 RATIO
Triglycerides: 102 mg/dL (ref <150)
VLDL: 20 mg/dL (ref 0–40)

## 2020-09-03 LAB — ECHOCARDIOGRAM COMPLETE
Area-P 1/2: 2.95 cm2
Height: 67 in
S' Lateral: 2.8 cm
Weight: 3033.53 oz

## 2020-09-03 LAB — HEMOGLOBIN A1C
Hgb A1c MFr Bld: 7.1 % — ABNORMAL HIGH (ref 4.8–5.6)
Mean Plasma Glucose: 157.07 mg/dL

## 2020-09-03 LAB — TROPONIN I (HIGH SENSITIVITY): Troponin I (High Sensitivity): 4 ng/L (ref <18)

## 2020-09-03 LAB — CBG MONITORING, ED
Glucose-Capillary: 133 mg/dL — ABNORMAL HIGH (ref 70–99)
Glucose-Capillary: 125 mg/dL — ABNORMAL HIGH (ref 70–99)

## 2020-09-03 MED ORDER — HYDROCODONE-ACETAMINOPHEN 10-325 MG PO TABS
1.0000 | ORAL_TABLET | Freq: Four times a day (QID) | ORAL | Status: DC | PRN
  Administered 2020-09-03 – 2020-09-05 (×6): 1 via ORAL
  Filled 2020-09-03 (×6): qty 1

## 2020-09-03 MED ORDER — NITROGLYCERIN 0.4 MG SL SUBL: 0.4000 mg | SUBLINGUAL_TABLET | SUBLINGUAL | Status: DC | PRN

## 2020-09-03 MED ORDER — POLYETHYLENE GLYCOL 3350 17 G PO PACK: 17.0000 g | PACK | Freq: Every day | ORAL | Status: DC | PRN

## 2020-09-03 MED ORDER — CHLORHEXIDINE GLUCONATE CLOTH 2 % EX PADS
6.0000 | MEDICATED_PAD | Freq: Every day | CUTANEOUS | Status: DC
  Administered 2020-09-03 – 2020-09-04 (×2): 6 via TOPICAL

## 2020-09-03 NOTE — Progress Notes (Addendum)
PROGRESS NOTE    BLONG BUSK  FKC:[id number] DOB: [birth year] DOA: 09/02/2020 PCP: Patient, No Pcp Per   Brief Narrative: 59 year old with past medical history significant for hypertension, TIA, seizure, insulin-dependent diabetes, hypertension who presents with new onset left-sided weakness, left facial droop and trouble speaking.  Presumably his symptoms started the morning of admission.  He was also complaining of left-sided headache.  Last month patient presented to the ED and had a tonic clonic seizure and right-sided weakness.  He underwent 3 days of EEG which was negative for active seizures.  ED course: CT angiogram of brain showed progressive high-grade focal stenosis within the proximal M2 right MCA branch vessel and moderate to severe focal stenosis with right PCA P3/P2 junction which is appear to be new compared to a similar study done August 2021.    Assessment & Plan:   Active Problems:   CVA (cerebral vascular accident) ([place])  Focal  neurological deficit ; complex migraine ?  -MRI brain: Negative for stroke -Hemoglobin A1c: 7.1 -LDL 34 -We will check EEG -Patient received sumatriptan subcu x1, oral sumatriptan ordered.  -Echo: Fraction 40 to 50%, wall motion abnormality left ventricular wall, mildly decreased right ventricular function. Awaiting neurology recommendations.   New decrease cardiomyopathy: troponin on admission negative.  Cardiology consulted.  Patient report chest pain, on and off. On exertion as well. Last episode today in the ED, after in and out cath, felt like gas.  Check EKG, troponin.  Dr Robb Matar consulted.   Urine retention;  In and out cath.  Second I and O, will place order.  Check UA. Culture.   AKI: Received IV fluids Cr on admission 1.6 Will repeat B-met.   Seizure disorder: Continue with Keppra Patient might need refill for refill.   IDDM: Continue with a sliding scale insulin Hypertension: Hold ARB and beta-blocker.   Systolic blood pressure soft   Estimated body mass index is 29.69 kg/m as calculated from the following:   Height as of this encounter: 5' 7" (1.702 m).   Weight as of this encounter: 86 kg.   DVT prophylaxis: Heparin Code Status: Full code Family Communication: Care discussed with patient Disposition Plan:  Status is: Inpatient  Remains inpatient appropriate because:Ongoing diagnostic testing needed not appropriate for outpatient work up   Dispo: The patient is from: Home              Anticipated d/c is to: To be determined              Anticipated d/c date is: 2 days              Patient currently is not medically stable to d/c.        Consultants:   Neurology  Procedures:   Echo  Antimicrobials:    Subjective: He is alert, he does not know what happened, he developed left-sided weakness.  He has not noticed any improvement on the left side weakness  Objective: Vitals:   09/03/20 0415 09/03/20 0430 09/03/20 0445 09/03/20 0500  BP: 105/74 98/71 95/71 107/72  Pulse: (!) 53 (!) 55 (!) 56 (!) 54  Resp: _0 Temp:      TempSrc:      SpO2: 98% 97% 97% 97%  Weight:      Height:       No intake or output data in the 24 hours ending 09/03/20 0740 Filed Weights   09/02/20 1429  Weight: 86 kg    Examination:  General exam: Appears calm and comfortable  Respiratory system: Clear to auscultation. Respiratory effort normal. Cardiovascular system: S1 & S2 heard, RRR. No JVD, murmurs, rubs, gallops or clicks. No pedal edema. Gastrointestinal system: Abdomen is nondistended, soft and nontender. No organomegaly or masses felt. Normal bowel sounds heard. Central nervous system: Alert and oriented.  He is not able to move left leg or left arm Extremities: Symmetric 5 x 5 power.   Data Reviewed: I have personally reviewed following labs and imaging studies  CBC: Recent Labs  Lab 09/02/20 1355 09/02/20 1414  WBC 5.8  --   NEUTROABS 3.7  --   HGB  12.7* 13.6  HCT 42.2 40.0  MCV 91.3  --   PLT 166  --    Basic Metabolic Panel: Recent Labs  Lab 09/02/20 1355 09/02/20 1414  NA 143 146*  K 4.0 4.0  CL 112* 112*  CO2 19*  --   GLUCOSE 153* 145*  BUN 12 13  CREATININE 1.64* 1.60*  CALCIUM 9.8  --    GFR: Estimated Creatinine Clearance: 52.7 mL/min (A) (by C-G formula based on SCr of 1.6 mg/dL (H)). Liver Function Tests: Recent Labs  Lab 09/02/20 1355  AST 29  ALT 35  ALKPHOS 60  BILITOT 0.4  PROT 7.4  ALBUMIN 4.3   No results for input(s): LIPASE, AMYLASE in the last 168 hours. No results for input(s): AMMONIA in the last 168 hours. Coagulation Profile: Recent Labs  Lab 09/02/20 1355  INR 1.1   Cardiac Enzymes: No results for input(s): CKTOTAL, CKMB, CKMBINDEX, TROPONINI in the last 168 hours. BNP (last 3 results) No results for input(s): PROBNP in the last 8760 hours. HbA1C: Recent Labs    09/03/20 0503  HGBA1C 7.1*   CBG: Recent Labs  Lab 09/02/20 1354 09/02/20 1701  GLUCAP 145* 108*   Lipid Profile: Recent Labs    09/03/20 0503  CHOL 79  HDL 25*  LDLCALC 34  TRIG 102  CHOLHDL 3.2   Thyroid Function Tests: No results for input(s): TSH, T4TOTAL, FREET4, T3FREE, THYROIDAB in the last 72 hours. Anemia Panel: No results for input(s): VITAMINB12, FOLATE, FERRITIN, TIBC, IRON, RETICCTPCT in the last 72 hours. Sepsis Labs: No results for input(s): PROCALCITON, LATICACIDVEN in the last 168 hours.  Recent Results (from the past 240 hour(s))  Respiratory Panel by RT PCR (Flu A&B, Covid) - Nasopharyngeal Swab     Status: None   Collection Time: 09/02/20  2:36 PM   Specimen: Nasopharyngeal Swab  Result Value Ref Range Status   SARS Coronavirus 2 by RT PCR NEGATIVE NEGATIVE Final    Comment: (NOTE) SARS-CoV-2 target nucleic acids are NOT DETECTED.  The SARS-CoV-2 RNA is generally detectable in upper respiratoy specimens during the acute phase of infection. The lowest concentration of  SARS-CoV-2 viral copies this assay can detect is 131 copies/mL. A negative result does not preclude SARS-Cov-2 infection and should not be used as the sole basis for treatment or other patient management decisions. A negative result may occur with  improper specimen collection/handling, submission of specimen other than nasopharyngeal swab, presence of viral mutation(s) within the areas targeted by this assay, and inadequate number of viral copies (<131 copies/mL). A negative result must be combined with clinical observations, patient history, and epidemiological information. The expected result is Negative.  Fact Sheet for Patients:  PinkCheek.be  Fact Sheet for Healthcare Providers:  GravelBags.it  This test is no t yet approved or cleared by the [place] and  has been authorized for detection and/or diagnosis of SARS-CoV-2 by FDA under an Emergency Use Authorization (EUA). This EUA will remain  in effect (meaning this test can be used) for the duration of the COVID-19 declaration under Section 564(b)(1) of the Act, 21 U.S.C. section 360bbb-3(b)(1), unless the authorization is terminated or revoked sooner.     Influenza A by PCR NEGATIVE NEGATIVE Final   Influenza B by PCR NEGATIVE NEGATIVE Final    Comment: (NOTE) The Xpert Xpress SARS-CoV-2/FLU/RSV assay is intended as an aid in  the diagnosis of influenza from Nasopharyngeal swab specimens and  should not be used as a sole basis for treatment. Nasal washings and  aspirates are unacceptable for Xpert Xpress SARS-CoV-2/FLU/RSV  testing.  Fact Sheet for Patients: PinkCheek.be  Fact Sheet for Healthcare Providers: GravelBags.it  This test is not yet approved or cleared by the [place] FDA and  has been authorized for detection and/or diagnosis of SARS-CoV-2 by  FDA under an Emergency Use  Authorization (EUA). This EUA will remain  in effect (meaning this test can be used) for the duration of the  Covid-19 declaration under Section 564(b)(1) of the Act, 21  U.S.C. section 360bbb-3(b)(1), unless the authorization is  terminated or revoked. Performed at [facility], [place] [address] [place], [place] [address]          Radiology Studies: MR BRAIN WO CONTRAST  Result Date: 09/02/2020 CLINICAL DATA:  Acute neuro deficit.  Rule out stroke. EXAM: MRI HEAD WITHOUT CONTRAST TECHNIQUE: Multiplanar, multiecho pulse sequences of the brain and surrounding structures were obtained without intravenous contrast. COMPARISON:  CT angio head and neck 09/02/2020 FINDINGS: Brain: No acute infarction, hemorrhage, hydrocephalus, extra-axial collection or mass lesion. Vascular: Normal arterial flow voids. Skull and upper cervical spine: Negative Sinuses/Orbits: Paranasal sinuses clear.  Negative orbit Other: None IMPRESSION: Negative MRI head Electronically Signed   By: Franchot Gallo M.D.   On: 09/02/2020 18:00   DG Chest Portable 1 View  Result Date: 09/02/2020 CLINICAL DATA:  Left-sided chest pain EXAM: PORTABLE CHEST 1 VIEW COMPARISON:  08/14/2020 FINDINGS: The heart size and mediastinal contours are within normal limits. Both lungs are clear. The visualized skeletal structures are unremarkable. IMPRESSION: No active disease. Electronically Signed   By: Donavan Foil M.D.   On: 09/02/2020 15:23   CT HEAD CODE STROKE WO CONTRAST  Result Date: 09/02/2020 CLINICAL DATA:  Code stroke. 59 year old male with left side weakness. EXAM: CT HEAD WITHOUT CONTRAST TECHNIQUE: Contiguous axial images were obtained from the base of the skull through the vertex without intravenous contrast. COMPARISON:  Brain MRI 07/22/2020 and earlier. FINDINGS: Brain: Gray-white matter differentiation appears stable and within normal limits. Asymmetric dystrophic calcification of the left basal ganglia again noted. No  midline shift, ventriculomegaly, mass effect, evidence of mass lesion, intracranial hemorrhage or evidence of cortically based acute infarction. Partially empty sella again noted. Vascular: No suspicious intracranial vascular hyperdensity identified. Skull: No acute osseous abnormality identified. Sinuses/Orbits: Visualized paranasal sinuses and mastoids are clear. Other: No acute orbit or scalp soft tissue finding identified. ASPECTS [facility] Stroke Program Early CT Score) Total score (0-10 with 10 being normal): 10 IMPRESSION: 1. Stable and normal noncontrast CT appearance of the brain. ASPECTS 10. 2. These results were communicated to Dr. Corine Shelter At 2:07 pm on 09/02/2020 by text page via the [facility] messaging system. Electronically Signed   By: Genevie Ann M.D.   On: 09/02/2020 14:08   CT ANGIO HEAD CODE STROKE  Addendum Date: 09/02/2020   ADDENDUM REPORT: 09/02/2020 14:56 ADDENDUM: These results were called by telephone at the time of interpretation on 09/02/2020 at 2:56 pm to provider Dr. Regenia Skeeter, who verbally acknowledged these results. Electronically Signed   By: Kellie Simmering DO   On: 09/02/2020 14:56   Result Date: 09/02/2020 CLINICAL DATA:  Suspected stroke. Last known well 12 p.m., left arm and leg weakness. EXAM: CT ANGIOGRAPHY HEAD AND NECK TECHNIQUE: Multidetector CT imaging of the head and neck was performed using the standard protocol during bolus administration of intravenous contrast. Multiplanar CT image reconstructions and MIPs were obtained to evaluate the vascular anatomy. Carotid stenosis measurements (when applicable) are obtained utilizing NASCET criteria, using the distal internal carotid diameter as the denominator. CONTRAST:  137m OMNIPAQUE IOHEXOL 350 MG/ML SOLN COMPARISON:  Noncontrast head CT performed earlier the same day 09/02/2020. Brain MRI 07/22/2020. CT angiogram head/neck 07/21/2020. FINDINGS: CTA NECK FINDINGS Aortic arch: Standard aortic branching. Minimal calcified plaque  within the distal innominate/proximal right subclavian artery. No hemodynamically significant innominate or proximal subclavian artery stenosis. Right carotid system: CCA and ICA patent within the neck without stenosis. No significant atherosclerotic disease. Left carotid system: CCA and ICA patent within the neck without stenosis. No significant atherosclerotic disease. Vertebral arteries: The right vertebral artery is dominant. Portions of the V2 left vertebral artery are obscured by streak artifact arising from spinal fusion hardware. Within this limitation, the vertebral arteries are patent within the neck bilaterally without significant stenosis. Skeleton: No acute bony abnormality or aggressive osseous lesion. Sequela of prior fusion at the C4-C7 levels. Other neck: No neck mass or cervical lymphadenopathy. Upper chest: No consolidation within the imaged lung apices. Review of the MIP images confirms the above findings CTA HEAD FINDINGS Anterior circulation: The intracranial internal carotid arteries are patent. The M1 middle cerebral arteries are patent without significant stenosis. No M2 proximal branch occlusion is identified. Atherosclerotic irregularity of M2 and more distal MCA branch vessels bilaterally. Most notably, there has been apparent interval progression of a now high-grade focal stenosis within a proximal M2 right MCA branch vessel (series 9, image 15). The anterior cerebral arteries are patent. No intracranial aneurysm is identified. Posterior circulation: The intracranial vertebral arteries are patent. The basilar artery is patent. The posterior cerebral arteries are patent. Apparent moderate/severe stenosis within the right posterior cerebral artery at the P3/P4 junction, not appreciated on the prior examination of 07/21/2020. (Series 10, image 56) (series 9, image 20). A right posterior communicating artery is present. A small left posterior communicating artery is likely present. Venous  sinuses: Within limitations of contrast timing, no convincing thrombus. Anatomic variants: As described. Review of the MIP images confirms the above findings IMPRESSION: CTA neck: Streak artifact arising from cervical spinal fusion hardware partially obscures portions of the V2 left vertebral artery. Within this limitation, the common carotid, internal carotid and vertebral arteries are patent within the neck without evidence of hemodynamically significant stenosis. CTA head: 1. No intracranial large vessel occlusion. 2. Atherosclerotic irregularity of M2 and more distal MCA branch vessels bilaterally. Most notably, there is a progressive high-grade focal stenosis within a proximal M2 right MCA branch vessel. 3. Apparent moderate/severe focal stenosis within the right posterior cerebral artery at the P3/P4 junction, which appears new. Electronically Signed: By: KKellie SimmeringDO On: 09/02/2020 14:47   CT ANGIO NECK CODE STROKE  Addendum Date: 09/02/2020   ADDENDUM REPORT: 09/02/2020 14:56 ADDENDUM: These results were called by telephone at the time of interpretation on  09/02/2020 at 2:56 pm to provider Dr. Regenia Skeeter, who verbally acknowledged these results. Electronically Signed   By: Kellie Simmering DO   On: 09/02/2020 14:56   Result Date: 09/02/2020 CLINICAL DATA:  Suspected stroke. Last known well 12 p.m., left arm and leg weakness. EXAM: CT ANGIOGRAPHY HEAD AND NECK TECHNIQUE: Multidetector CT imaging of the head and neck was performed using the standard protocol during bolus administration of intravenous contrast. Multiplanar CT image reconstructions and MIPs were obtained to evaluate the vascular anatomy. Carotid stenosis measurements (when applicable) are obtained utilizing NASCET criteria, using the distal internal carotid diameter as the denominator. CONTRAST:  123m OMNIPAQUE IOHEXOL 350 MG/ML SOLN COMPARISON:  Noncontrast head CT performed earlier the same day 09/02/2020. Brain MRI 07/22/2020. CT angiogram  head/neck 07/21/2020. FINDINGS: CTA NECK FINDINGS Aortic arch: Standard aortic branching. Minimal calcified plaque within the distal innominate/proximal right subclavian artery. No hemodynamically significant innominate or proximal subclavian artery stenosis. Right carotid system: CCA and ICA patent within the neck without stenosis. No significant atherosclerotic disease. Left carotid system: CCA and ICA patent within the neck without stenosis. No significant atherosclerotic disease. Vertebral arteries: The right vertebral artery is dominant. Portions of the V2 left vertebral artery are obscured by streak artifact arising from spinal fusion hardware. Within this limitation, the vertebral arteries are patent within the neck bilaterally without significant stenosis. Skeleton: No acute bony abnormality or aggressive osseous lesion. Sequela of prior fusion at the C4-C7 levels. Other neck: No neck mass or cervical lymphadenopathy. Upper chest: No consolidation within the imaged lung apices. Review of the MIP images confirms the above findings CTA HEAD FINDINGS Anterior circulation: The intracranial internal carotid arteries are patent. The M1 middle cerebral arteries are patent without significant stenosis. No M2 proximal branch occlusion is identified. Atherosclerotic irregularity of M2 and more distal MCA branch vessels bilaterally. Most notably, there has been apparent interval progression of a now high-grade focal stenosis within a proximal M2 right MCA branch vessel (series 9, image 15). The anterior cerebral arteries are patent. No intracranial aneurysm is identified. Posterior circulation: The intracranial vertebral arteries are patent. The basilar artery is patent. The posterior cerebral arteries are patent. Apparent moderate/severe stenosis within the right posterior cerebral artery at the P3/P4 junction, not appreciated on the prior examination of 07/21/2020. (Series 10, image 56) (series 9, image 20). A right  posterior communicating artery is present. A small left posterior communicating artery is likely present. Venous sinuses: Within limitations of contrast timing, no convincing thrombus. Anatomic variants: As described. Review of the MIP images confirms the above findings IMPRESSION: CTA neck: Streak artifact arising from cervical spinal fusion hardware partially obscures portions of the V2 left vertebral artery. Within this limitation, the common carotid, internal carotid and vertebral arteries are patent within the neck without evidence of hemodynamically significant stenosis. CTA head: 1. No intracranial large vessel occlusion. 2. Atherosclerotic irregularity of M2 and more distal MCA branch vessels bilaterally. Most notably, there is a progressive high-grade focal stenosis within a proximal M2 right MCA branch vessel. 3. Apparent moderate/severe focal stenosis within the right posterior cerebral artery at the P3/P4 junction, which appears new. Electronically Signed: By: KKellie SimmeringDO On: 09/02/2020 14:47        Scheduled Meds: .  stroke: mapping our early stages of recovery book   Does not apply Once  . acetaminophen  650 mg Oral Q8H  . atorvastatin  80 mg Oral QHS  . clopidogrel  75 mg Oral Daily  . ergocalciferol  50,000 Units Oral Q Mon  . fenofibrate  160 mg Oral Daily  . fluticasone furoate-vilanterol  1 puff Inhalation Daily  . gabapentin  600 mg Oral BID  . heparin  5,000 Units Subcutaneous Q12H  . insulin aspart  0-15 Units Subcutaneous TID WC  . latanoprost  1 drop Both Eyes QHS  . levETIRAcetam  500 mg Oral BID  . metoprolol tartrate  25 mg Oral BID  . nortriptyline  100 mg Oral QHS  . pantoprazole  40 mg Oral BID  . sertraline  50 mg Oral Daily  . timolol  1 drop Both Eyes BID  . zonisamide  200 mg Oral BID   Continuous Infusions: . sodium chloride 100 mL/hr at 09/03/20 0011     LOS: 1 day    Time spent: 35 minutes     A , MD [facility]   If 7PM-7AM, please contact night-coverage [email or web address]  09/03/2020, 7:40 AM

## 2020-09-03 NOTE — ED Notes (Signed)
Breakfast ordered 

## 2020-09-03 NOTE — Progress Notes (Signed)
  Echocardiogram 2D Echocardiogram has been performed.  Nicholas [name] 09/03/2020, 11:31 AM

## 2020-09-03 NOTE — ED Notes (Signed)
In and Out cath performed per MD order, pt with 900cc urine output.

## 2020-09-03 NOTE — Evaluation (Signed)
Physical Therapy Evaluation Patient Details Name: Nicholas [name] MRN: [id number] DOB: 05-Dec-1961 Today's Date: 09/03/2020   History of Present Illness  Pt is a 59 y.o. male with medical history significant of hypertension, hyperthyroidism, DM type II, bipolar 1 disorder, migraine headaches, seizure disorder, and history of COVID-19 infection in 2020 presented with complaints of L-sided weakness and slurred speech. CT scan and MRI of the brain negative for any acute abnormalities for same problem in 8/21. MRI neg this admission as well and possibility is migraines. Of note, pt had just run out of his migraine medication  Clinical Impression  Pt admitted with above diagnosis. Pt presents with increased numbness as well as pain and cramping LLE and LUE as well as decreased control of these extremities. Pt required mod A to come to edge of stretcher and worked on LUE and [employer] in sitting. Pt's feet could not reach floor on stretcher and given poor muscular control of LLE did not feel it was safe to have him attempt standing from this height. Will need to be further evaluated. Of note, pt reports that he was supposed to get a RW after last admission and never received it. Also relays need for CNA to assist with ADL's.   Pt currently with functional limitations due to the deficits listed below (see PT Problem List). Pt will benefit from skilled PT to increase their independence and safety with mobility to allow discharge to the venue listed below.       Follow Up Recommendations CIR;Supervision/Assistance - 24 hour    Equipment Recommendations  Rolling walker with 5" wheels    Recommendations for Other Services Rehab consult     Precautions / Restrictions Precautions Precautions: Fall Restrictions Weight Bearing Restrictions: No      Mobility  Bed Mobility Overal bed mobility: Needs Assistance Bed Mobility: Supine to Sit;Sit to Supine     Supine to sit: Mod assist Sit to supine: Mod  assist   General bed mobility comments: mod A to LLE to come to edge of stretcher and mod A L hip to scoot out. Mod A to LLE for return to supine  Transfers Overall transfer level: Needs assistance               General transfer comment: unable to safely attempt from stretcher (pt's feet did not reach floor) due to LE weakness  Ambulation/Gait             General Gait Details: unable to safely test  Stairs            Wheelchair Mobility    Modified Rankin (Stroke Patients Only)       Balance Overall balance assessment: Needs assistance Sitting-balance support: Single extremity supported;Feet unsupported Sitting balance-Leahy Scale: Fair Sitting balance - Comments: worked on [employer] it sitting, through elbow and hand. Also supported LLE on chair for limited WB'ing through LLE.                                       Pertinent Vitals/Pain Pain Assessment: Faces Faces Pain Scale: Hurts little more Pain Location: L foot, especially 4th toe Pain Descriptors / Indicators: Cramping Pain Intervention(s): Limited activity within patient's tolerance;Monitored during session;Repositioned    Home Living Family/patient expects to be discharged to:: Private residence Living Arrangements: Alone Available Help at Discharge: Family Type of Home: Apartment Home Access: Stairs to enter  Entrance Stairs-Number of Steps: 10 Home Layout: One level Home Equipment: Cane - single point Additional Comments: may have moved apts since last admission, need to confirm with him. He states that he was supposed to receive a RW last admission but it never came    Prior Function Level of Independence: Independent with assistive device(s)         Comments: has been using cane     Hand Dominance   Dominant Hand: Right    Extremity/Trunk Assessment   Upper Extremity Assessment Upper Extremity Assessment: Defer to OT evaluation;LUE deficits/detail LUE  Deficits / Details: swelling noted L hand    Lower Extremity Assessment Lower Extremity Assessment: LLE deficits/detail LLE Deficits / Details: can move L toes minimally but no active df at ankle, hip flex 2/5, knee ext 2/5, pt reports L knee sometimes buckles at baseline but he now has more decreased control as well as cramping sensations in L leg and foot  LLE Sensation: decreased proprioception;decreased light touch LLE Coordination: decreased gross motor;decreased fine motor    Cervical / Trunk Assessment Cervical / Trunk Assessment: Normal  Communication   Communication: No difficulties  Cognition Arousal/Alertness: Awake/alert Behavior During Therapy: WFL for tasks assessed/performed Overall Cognitive Status: Within Functional Limits for tasks assessed                                        General Comments General comments (skin integrity, edema, etc.): no dizziness or HA when sitting EOB    Exercises     Assessment/Plan    PT Assessment Patient needs continued PT services  PT Problem List Decreased strength;Decreased activity tolerance;Decreased balance;Decreased range of motion;Decreased mobility;Decreased coordination;Decreased knowledge of precautions;Pain;Impaired sensation;Impaired tone       PT Treatment Interventions DME instruction;Gait training;Stair training;Functional mobility training;Therapeutic activities;Therapeutic exercise;Balance training;Patient/family education;Neuromuscular re-education    PT Goals (Current goals can be found in the Care Plan section)  Acute Rehab PT Goals Patient Stated Goal: return home PT Goal Formulation: With patient Time For Goal Achievement: 09/17/20 Potential to Achieve Goals: Good    Frequency Min 4X/week   Barriers to discharge Decreased caregiver support lives alone    [name]-evaluation               AM-PAC PT "6 Clicks" Mobility  Outcome Measure Help needed turning from your back to your  side while in a flat bed without using bedrails?: A Little Help needed moving from lying on your back to sitting on the side of a flat bed without using bedrails?: A Little Help needed moving to and from a bed to a chair (including a wheelchair)?: A Lot Help needed standing up from a chair using your arms (e.g., wheelchair or bedside chair)?: A Lot Help needed to walk in hospital room?: A Lot Help needed climbing 3-5 steps with a railing? : A Lot 6 Click Score: 14    End of Session   Activity Tolerance: Patient tolerated treatment well Patient left: in bed;with call bell/phone within reach Nurse Communication: Mobility status PT Visit Diagnosis: Unsteadiness on feet (R26.81);Pain;Hemiplegia and hemiparesis Hemiplegia - Right/Left: Left Hemiplegia - dominant/non-dominant: Non-dominant Hemiplegia - caused by: Other cerebrovascular disease Pain - Right/Left: Left Pain - part of body: Leg;Hand    Time: 6644-0347 PT Time Calculation (min) (ACUTE ONLY): 31 min   Charges:   PT Evaluation $PT Eval Moderate Complexity: 1 Mod PT Treatments $Therapeutic  Activity: 8-22 mins        Nicholas [name], PT  Acute Rehab Services  Pager [phone] Office [phone]  Nicholas [name] Nicholas [name] 09/03/2020, 1:11 PM

## 2020-09-03 NOTE — Consult Note (Signed)
CARDIOLOGY CONSULT NOTE  Patient ID: Nicholas [name] MRN: [id number] DOB/AGE: 1961/01/18 59 y.o.  Admit date: 09/02/2020 Referring Physician: [facility] hospitalist Reason for Consultation: Abnormal echocardiogram  HPI:   59 y.o. 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  Cardiology consulted due to abnormal echocardiogram and chest pain.  Stroke work-up was essentially negative.  Patient was seen by neurology.  Presentation was thought to be complex migraine.  Patient has had had two episodes of chest pain at rest during this hospitalization, lasting for up to 30-40 min, improved with drinking water and burping. EKG, HS trop negative for ischemia.   Past Medical History:  Diagnosis Date  . Asthma   . Bipolar 1 disorder (HCC)   . Borderline glaucoma   . Chronic pain   . Epididymal pain    LEFT  . 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 Feeling of incomplete bladder emptying   . Frequency of urination   . Gastric ulcer   . GERD (gastroesophageal reflux disease)   . Hypertension   . Hyperthyroidism    NO MEDS   . Migraine   . Seizures (HCC)   . TIA (transient ischemic attack)   . Type 2 diabetes mellitus (HCC)      Past Surgical History:  Procedure Laterality Date  . ABDOMINAL SURGERY    . ANTERIOR CERVICAL DECOMP/DISCECTOMY FUSION  2007   C4  --  C6  . CERVICAL FUSION    . CHOLECYSTECTOMY    . CYSTOSCOPY N/A 11/09/2013   Procedure: CYSTOSCOPY FLEXIBLE;  Surgeon: Bjorn Pippin, MD;  Location: [facility];  Service: Urology;  Laterality: N/A;  . EPIDIDYMECTOMY Left 11/09/2013   Procedure:  LEFT EPIDIDYMECTOMY;  Surgeon: Bjorn Pippin, MD;  Location: [facility];  Service:  Urology;  Laterality: Left;  POSSIBLE OUTPATIENT WITH OBSERVATION  . EXCISION LIPOMA LEFT SHOULDER  2004 (APPROX)  . MULTIPLE CYST REMOVED FROM CHEST  AGE 59  . OTHER SURGICAL HISTORY     hemorroid surgery   . TESTICLE REMOVAL Left   . TONSILLECTOMY       Family History  Problem Relation Age of Onset  . Heart failure Mother   . Diabetes Mother   . Hypertension Mother   . Cirrhosis Father   . Heart failure Father   . Kidney disease Father      Social History: Social History   Socioeconomic History  . Marital status: Widowed    Spouse name: Not on file  . Number of children: Not on file  . Years of education: Not on file  . Highest education level: Not on file  Occupational History  . Not on file  Tobacco Use  . Smoking status: Former Smoker    Packs/day: 0.25    Years: 15.00    Pack years: 3.75    Types: Cigarettes    Quit date: 11/08/1992    Years since quitting: 27.8  . Smokeless tobacco: Never Used  Vaping Use  . Vaping Use: Never used  Substance and Sexual Activity  . Alcohol use: No  . Drug use: No  . Sexual activity: Not Currently  Other Topics Concern  . Not on file  Social History Narrative   **  Merged History Encounter **       Social Determinants of Health   Financial Resource Strain:   . Difficulty of Paying Living Expenses: Not on file  Food Insecurity:   . Worried About Programme researcher, broadcasting/film/videounning Out of Food in the Last Year: Not on file  . Ran Out of Food in the Last Year: Not on file  Transportation Needs:   . Lack of Transportation (Medical): Not on file  . Lack of Transportation (Non-Medical): Not on file  Physical Activity:   . Days of Exercise per Week: Not on file  . Minutes of Exercise per Session: Not on file  Stress:   . Feeling of Stress : Not on file  Social Connections:   . Frequency of Communication with Friends and Family: Not on file  . Frequency of Social Gatherings with Friends and Family: Not on file  . Attends Religious Services: Not on  file  . Active Member of Clubs or Organizations: Not on file  . Attends BankerClub or Organization Meetings: Not on file  . Marital Status: Not on file  Intimate Partner Violence:   . Fear of Current or Ex-Partner: Not on file  . Emotionally Abused: Not on file  . Physically Abused: Not on file  . Sexually Abused: Not on file     Medications Prior to Admission  Medication Sig Dispense Refill Last Dose  . acetaminophen (TYLENOL 8 HOUR) 650 MG CR tablet Take 1 tablet (650 mg total) by mouth every 8 (eight) hours. 30 tablet 0 09/02/2020 at Unknown time  . albuterol (PROVENTIL HFA;VENTOLIN HFA) 108 (90 Base) MCG/ACT inhaler Inhale 1-2 puffs into the lungs every 6 (six) hours as needed for wheezing or shortness of breath. 1 Inhaler 0 08/31/2020  . atorvastatin (LIPITOR) 80 MG tablet Take 80 mg by mouth at bedtime.    09/01/2020 at Unknown time  . budesonide-formoterol (SYMBICORT) 80-4.5 MCG/ACT inhaler Inhale 2 puffs into the lungs daily.   09/02/2020 at Unknown time  . clopidogrel (PLAVIX) 75 MG tablet Take 1 tablet (75 mg total) by mouth daily. 30 tablet 01 09/02/2020 at Unknown time  . FARXIGA 10 MG TABS tablet Take 10 mg by mouth daily.   09/02/2020 at Unknown time  . fenofibrate (TRICOR) 145 MG tablet Take 145 mg by mouth daily.   09/02/2020 at Unknown time  . fluticasone-salmeterol (ADVAIR HFA) 115-21 MCG/ACT inhaler Inhale 2 puffs into the lungs daily.   09/02/2020 at Unknown time  . gabapentin (NEURONTIN) 300 MG capsule Take 900 mg by mouth at bedtime.   09/01/2020 at Unknown time  . HUMALOG KWIKPEN 200 UNIT/ML KwikPen Inject 0-10 Units into the skin 3 (three) times daily with meals. 1 pen 01 09/01/2020 at Unknown time  . HYDROcodone-acetaminophen (NORCO) 10-325 MG tablet Take 1 tablet by mouth every 6 (six) hours as needed for moderate pain.   Past Month at Unknown time  . latanoprost (XALATAN) 0.005 % ophthalmic solution Place 1 drop into both eyes at bedtime.    09/01/2020 at Unknown time  .  levETIRAcetam (KEPPRA) 250 MG tablet Take 3 tablets (750 mg total) by mouth 2 (two) times daily. 750 mg twice daily for a month, then on 08/25/20 take 500 mg twice daily for a month then 09/24/20 take 250 mg twice daily for a month then stop (Patient taking differently: Take 500 mg by mouth 2 (two) times daily. 750 mg twice daily for a month, then on 08/25/20 take 500 mg twice daily for a month then  09/24/20 take 250 mg twice daily for a month then stop) 180 tablet 1 09/02/2020 at Unknown time  . metFORMIN (GLUCOPHAGE) 1000 MG tablet Take 1 tablet (1,000 mg total) by mouth 2 (two) times daily. 60 tablet 01 09/02/2020 at Unknown time\  . nortriptyline (PAMELOR) 50 MG capsule Take 100 mg by mouth at bedtime.    09/01/2020 at Unknown time  . pantoprazole (PROTONIX) 40 MG tablet Take 40 mg by mouth 2 (two) times daily.   09/02/2020 at Unknown time  . polyethylene glycol powder (GLYCOLAX/MIRALAX) 17 GM/SCOOP powder Take 17 g by mouth daily as needed for mild constipation.   unkn  . rizatriptan (MAXALT) 10 MG tablet Take 10 mg by mouth daily. May repeat in 2 hours if needed    09/02/2020 at Unknown time  . sertraline (ZOLOFT) 50 MG tablet Take 50 mg by mouth daily.   09/02/2020 at Unknown time  . SUMAtriptan (IMITREX) 100 MG tablet Take 1 tablet (100 mg total) by mouth daily as needed for migraine or headache. May repeat in 2 hours if headache persists or recurs. 20 tablet 0 09/02/2020 at Unknown time  . timolol (TIMOPTIC) 0.5 % ophthalmic solution Place 1 drop into both eyes 2 (two) times daily.    09/02/2020 at Unknown time  . valsartan (DIOVAN) 320 MG tablet Take 320 mg by mouth daily.   09/02/2020 at Unknown time  . zonisamide (ZONEGRAN) 100 MG capsule Take 200 mg by mouth 2 (two) times daily.    09/02/2020 at Unknown time  . ergocalciferol (VITAMIN D2) 1.25 MG (50000 UT) capsule Take 50,000 Units by mouth every Monday.   09/02/2020    Review of Systems  Constitutional: Negative for decreased appetite,  malaise/fatigue, weight gain and weight loss.  HENT: Negative for congestion.   Eyes: Negative for visual disturbance.  Cardiovascular: Positive for chest pain. Negative for dyspnea on exertion, leg swelling, palpitations and syncope.  Respiratory: Negative for cough.   Endocrine: Negative for cold intolerance.  Hematologic/Lymphatic: Does not bruise/bleed easily.  Skin: Negative for itching and rash.  Musculoskeletal: Negative for myalgias.  Gastrointestinal: Negative for abdominal pain, nausea and vomiting.  Genitourinary: Negative for dysuria.  Neurological: Negative for dizziness and weakness.  Psychiatric/Behavioral: The patient is not nervous/anxious.   All other systems reviewed and are negative.     Physical Exam: Physical Exam Vitals and nursing note reviewed.  Constitutional:      General: He is not in acute distress.    Appearance: He is well-developed.  HENT:     Head: Normocephalic and atraumatic.  Eyes:     Conjunctiva/sclera: Conjunctivae normal.     Pupils: Pupils are equal, round, and reactive to light.  Neck:     Vascular: No JVD.  Cardiovascular:     Rate and Rhythm: Normal rate and regular rhythm.     Pulses: Normal pulses and intact distal pulses.     Heart sounds: No murmur heard.   Pulmonary:     Effort: Pulmonary effort is normal.     Breath sounds: Normal breath sounds. No wheezing or rales.  Abdominal:     General: Bowel sounds are normal.     Palpations: Abdomen is soft.     Tenderness: There is no rebound.  Musculoskeletal:        General: No tenderness. Normal range of motion.     Left lower leg: No edema.  Lymphadenopathy:     Cervical: No cervical adenopathy.  Skin:    General: Skin  is warm and dry.  Neurological:     Mental Status: He is alert and oriented to person, place, and time.     Cranial Nerves: No cranial nerve deficit.      Labs:   Lab Results  Component Value Date   WBC 5.8 09/02/2020   HGB 13.6 09/02/2020   HCT  40.0 09/02/2020   MCV 91.3 09/02/2020   PLT 166 09/02/2020    Recent Labs  Lab 09/02/20 1355 09/02/20 1414 09/03/20 1910  NA 143   < > 138  K 4.0   < > 3.9  CL 112*   < > 109  CO2 19*   < > 20*  BUN 12   < > 15  CREATININE 1.64*   < > 1.40*  CALCIUM 9.8   < > 9.0  PROT 7.4  --   --   BILITOT 0.4  --   --   ALKPHOS 60  --   --   ALT 35  --   --   AST 29  --   --   GLUCOSE 153*   < > 185*   < > = values in this interval not displayed.    Lipid Panel     Component Value Date/Time   CHOL 79 09/03/2020 0503   TRIG 102 09/03/2020 0503   HDL 25 (L) 09/03/2020 0503   CHOLHDL 3.2 09/03/2020 0503   VLDL 20 09/03/2020 0503   LDLCALC 34 09/03/2020 0503    BNP (last 3 results) No results for input(s): BNP in the last 8760 hours.  HEMOGLOBIN A1C Lab Results  Component Value Date   HGBA1C 7.1 (H) 09/03/2020   MPG 157.07 09/03/2020    Cardiac Panel (last 3 results) No results for input(s): CKTOTAL, CKMB, RELINDX in the last 8760 hours.  Invalid input(s): TROPONINHS  No results found for: CKTOTAL, CKMB, CKMBINDEX   TSH Recent Labs    07/05/20 0401  TSH 4.244      Radiology: MR BRAIN WO CONTRAST  Result Date: 09/02/2020 CLINICAL DATA:  Acute neuro deficit.  Rule out stroke. EXAM: MRI HEAD WITHOUT CONTRAST TECHNIQUE: Multiplanar, multiecho pulse sequences of the brain and surrounding structures were obtained without intravenous contrast. COMPARISON:  CT angio head and neck 09/02/2020 FINDINGS: Brain: No acute infarction, hemorrhage, hydrocephalus, extra-axial collection or mass lesion. Vascular: Normal arterial flow voids. Skull and upper cervical spine: Negative Sinuses/Orbits: Paranasal sinuses clear.  Negative orbit Other: None IMPRESSION: Negative MRI head Electronically Signed   By: Marlan Palau M.D.   On: 09/02/2020 18:00   DG Chest Portable 1 View  Result Date: 09/02/2020 CLINICAL DATA:  Left-sided chest pain EXAM: PORTABLE CHEST 1 VIEW COMPARISON:   08/14/2020 FINDINGS: The heart size and mediastinal contours are within normal limits. Both lungs are clear. The visualized skeletal structures are unremarkable. IMPRESSION: No active disease. Electronically Signed   By: Jasmine Pang M.D.   On: 09/02/2020 15:23   ECHOCARDIOGRAM COMPLETE  Result Date: 09/03/2020    ECHOCARDIOGRAM REPORT   Patient Name:   Nicholas [name] Date of Exam: 09/03/2020 Medical Rec #:  [id number]      Height:       67.0 in Accession #:    [id number]     Weight:       189.6 lb Date of Birth:  1961/06/16     BSA:          1.977 m Patient Age:    53 years  BP:           121/85 mmHg Patient Gender: M              HR:           65 bpm. Exam Location:  Inpatient Procedure: 2D Echo, Cardiac Doppler and Color Doppler Indications:    TIA 435.9 / G45.9  History:        Patient has prior history of Echocardiogram examinations, most                 recent 07/05/2017. Risk Factors:Diabetes and Hypertension.                 Epilepsy. Seizures. GERD. Hyperthyroidism.  Sonographer:    Elmarie Shiley Dance Referring Phys: [id number] Emeline General IMPRESSIONS  1. Left ventricular ejection fraction, by estimation, is 45 to 50%. The left ventricle has mildly decreased function. The left ventricle demonstrates regional wall motion abnormalities (see scoring diagram/findings for description). There is mild left ventricular hypertrophy. Left ventricular diastolic parameters are consistent with Grade I diastolic dysfunction (impaired relaxation). There is moderate hypokinesis of the left ventricular, basal-mid inferolateral wall and inferior wall suggesting possible circumflex territory ischemia  2. Right ventricular systolic function is mildly reduced. The right ventricular size is normal. There is normal pulmonary artery systolic pressure. The estimated right ventricular systolic pressure is 23.2 mmHg.  3. The mitral valve is abnormal. Trivial mitral valve regurgitation.  4. The aortic valve is tricuspid. Aortic valve  regurgitation is not visualized.  5. The inferior vena cava is normal in size with greater than 50% respiratory variability, suggesting right atrial pressure of 3 mmHg. Comparison(s): Changes from prior study are noted. 07/05/2017: LVEF 60-65%. FINDINGS  Left Ventricle: Left ventricular ejection fraction, by estimation, is 45 to 50%. The left ventricle has mildly decreased function. The left ventricle demonstrates regional wall motion abnormalities. Moderate hypokinesis of the left ventricular, basal-mid inferolateral wall and inferior wall. The left ventricular internal cavity size was normal in size. There is mild left ventricular hypertrophy. Left ventricular diastolic parameters are consistent with Grade I diastolic dysfunction (impaired relaxation). Indeterminate filling pressures. Right Ventricle: The right ventricular size is normal. No increase in right ventricular wall thickness. Right ventricular systolic function is mildly reduced. There is normal pulmonary artery systolic pressure. The tricuspid regurgitant velocity is 2.25 m/s, and with an assumed right atrial pressure of 3 mmHg, the estimated right ventricular systolic pressure is 23.2 mmHg. Left Atrium: Left atrial size was normal in size. Right Atrium: Right atrial size was normal in size. Pericardium: There is no evidence of pericardial effusion. Mitral Valve: The mitral valve is abnormal. There is moderate thickening of the mitral valve leaflet(s). Trivial mitral valve regurgitation. Tricuspid Valve: The tricuspid valve is grossly normal. Tricuspid valve regurgitation is trivial. Aortic Valve: The aortic valve is tricuspid. Aortic valve regurgitation is not visualized. Pulmonic Valve: The pulmonic valve was grossly normal. Pulmonic valve regurgitation is trivial. Aorta: The aortic root and ascending aorta are structurally normal, with no evidence of dilitation. Venous: The inferior vena cava is normal in size with greater than 50% respiratory  variability, suggesting right atrial pressure of 3 mmHg. IAS/Shunts: No atrial level shunt detected by color flow Doppler.  LEFT VENTRICLE PLAX 2D LVIDd:         4.70 cm  Diastology LVIDs:         2.80 cm  LV e' medial:    6.42 cm/s LV PW:  1.20 cm  LV E/e' medial:  9.2 LV IVS:        1.20 cm  LV e' lateral:   8.49 cm/s LVOT diam:     2.30 cm  LV E/e' lateral: 7.0 LV SV:         64 LV SV Index:   32 LVOT Area:     4.15 cm  RIGHT VENTRICLE            IVC RV Basal diam:  3.00 cm    IVC diam: 1.80 cm RV S prime:     7.35 cm/s TAPSE (M-mode): 1.8 cm LEFT ATRIUM             Index       RIGHT ATRIUM           Index LA diam:        3.80 cm 1.92 cm/m  RA Area:     18.00 cm LA Vol (A2C):   49.7 ml 25.14 ml/m RA Volume:   47.30 ml  23.93 ml/m LA Vol (A4C):   45.4 ml 22.97 ml/m LA Biplane Vol: 49.6 ml 25.09 ml/m  AORTIC VALVE LVOT Vmax:   69.80 cm/s LVOT Vmean:  50.400 cm/s LVOT VTI:    0.154 m  AORTA Ao Root diam: 3.70 cm Ao Asc diam:  3.60 cm MITRAL VALVE               TRICUSPID VALVE MV Area (PHT): 2.95 cm    TR Peak grad:   20.2 mmHg MV Decel Time: 257 msec    TR Vmax:        225.00 cm/s MV E velocity: 59.10 cm/s MV A velocity: 59.10 cm/s  SHUNTS MV E/A ratio:  1.00        Systemic VTI:  0.15 m                            Systemic Diam: 2.30 cm Zoila Shutter MD Electronically signed by Zoila Shutter MD Signature Date/Time: 09/03/2020/11:45:14 AM    Final    CT HEAD CODE STROKE WO CONTRAST  Result Date: 09/02/2020 CLINICAL DATA:  Code stroke. 59 year old male with left side weakness. EXAM: CT HEAD WITHOUT CONTRAST TECHNIQUE: Contiguous axial images were obtained from the base of the skull through the vertex without intravenous contrast. COMPARISON:  Brain MRI 07/22/2020 and earlier. FINDINGS: Brain: Gray-white matter differentiation appears stable and within normal limits. Asymmetric dystrophic calcification of the left basal ganglia again noted. No midline shift, ventriculomegaly, mass effect, evidence of  mass lesion, intracranial hemorrhage or evidence of cortically based acute infarction. Partially empty sella again noted. Vascular: No suspicious intracranial vascular hyperdensity identified. Skull: No acute osseous abnormality identified. Sinuses/Orbits: Visualized paranasal sinuses and mastoids are clear. Other: No acute orbit or scalp soft tissue finding identified. ASPECTS [facility] Stroke Program Early CT Score) Total score (0-10 with 10 being normal): 10 IMPRESSION: 1. Stable and normal noncontrast CT appearance of the brain. ASPECTS 10. 2. These results were communicated to Dr. Darnelle Maffucci At 2:07 pm on 09/02/2020 by text page via the [facility] messaging system. Electronically Signed   By: Odessa Fleming M.D.   On: 09/02/2020 14:08   CT ANGIO HEAD CODE STROKE  Addendum Date: 09/02/2020   ADDENDUM REPORT: 09/02/2020 14:56 ADDENDUM: These results were called by telephone at the time of interpretation on 09/02/2020 at 2:56 pm to provider Dr. Criss Alvine, who verbally acknowledged these results. Electronically Signed  By: Jackey Loge DO   On: 09/02/2020 14:56   Result Date: 09/02/2020 CLINICAL DATA:  Suspected stroke. Last known well 12 p.m., left arm and leg weakness. EXAM: CT ANGIOGRAPHY HEAD AND NECK TECHNIQUE: Multidetector CT imaging of the head and neck was performed using the standard protocol during bolus administration of intravenous contrast. Multiplanar CT image reconstructions and MIPs were obtained to evaluate the vascular anatomy. Carotid stenosis measurements (when applicable) are obtained utilizing NASCET criteria, using the distal internal carotid diameter as the denominator. CONTRAST:  OMNIPAQUE IOHEXOL 350 MG/ML SOLN COMPARISON:  Noncontrast head CT performed earlier the same day 09/02/2020. Brain MRI 07/22/2020. CT angiogram head/neck 07/21/2020. FINDINGS: CTA NECK FINDINGS Aortic arch: Standard aortic branching. Minimal calcified plaque within the distal innominate/proximal right subclavian  artery. No hemodynamically significant innominate or proximal subclavian artery stenosis. Right carotid system: CCA and ICA patent within the neck without stenosis. No significant atherosclerotic disease. Left carotid system: CCA and ICA patent within the neck without stenosis. No significant atherosclerotic disease. Vertebral arteries: The right vertebral artery is dominant. Portions of the V2 left vertebral artery are obscured by streak artifact arising from spinal fusion hardware. Within this limitation, the vertebral arteries are patent within the neck bilaterally without significant stenosis. Skeleton: No acute bony abnormality or aggressive osseous lesion. Sequela of prior fusion at the C4-C7 levels. Other neck: No neck mass or cervical lymphadenopathy. Upper chest: No consolidation within the imaged lung apices. Review of the MIP images confirms the above findings CTA HEAD FINDINGS Anterior circulation: The intracranial internal carotid arteries are patent. The M1 middle cerebral arteries are patent without significant stenosis. No M2 proximal branch occlusion is identified. Atherosclerotic irregularity of M2 and more distal MCA branch vessels bilaterally. Most notably, there has been apparent interval progression of a now high-grade focal stenosis within a proximal M2 right MCA branch vessel (series 9, image 15). The anterior cerebral arteries are patent. No intracranial aneurysm is identified. Posterior circulation: The intracranial vertebral arteries are patent. The basilar artery is patent. The posterior cerebral arteries are patent. Apparent moderate/severe stenosis within the right posterior cerebral artery at the P3/P4 junction, not appreciated on the prior examination of 07/21/2020. (Series 10, image 56) (series 9, image 20). A right posterior communicating artery is present. A small left posterior communicating artery is likely present. Venous sinuses: Within limitations of contrast timing, no  convincing thrombus. Anatomic variants: As described. Review of the MIP images confirms the above findings IMPRESSION: CTA neck: Streak artifact arising from cervical spinal fusion hardware partially obscures portions of the V2 left vertebral artery. Within this limitation, the common carotid, internal carotid and vertebral arteries are patent within the neck without evidence of hemodynamically significant stenosis. CTA head: 1. No intracranial large vessel occlusion. 2. Atherosclerotic irregularity of M2 and more distal MCA branch vessels bilaterally. Most notably, there is a progressive high-grade focal stenosis within a proximal M2 right MCA branch vessel. 3. Apparent moderate/severe focal stenosis within the right posterior cerebral artery at the P3/P4 junction, which appears new. Electronically Signed: By: Jackey Loge DO On: 09/02/2020 14:47   CT ANGIO NECK CODE STROKE  Addendum Date: 09/02/2020   ADDENDUM REPORT: 09/02/2020 14:56 ADDENDUM: These results were called by telephone at the time of interpretation on 09/02/2020 at 2:56 pm to provider Dr. Criss Alvine, who verbally acknowledged these results. Electronically Signed   By: Jackey Loge DO   On: 09/02/2020 14:56   Result Date: 09/02/2020 CLINICAL DATA:  Suspected stroke. Last known  well 12 p.m., left arm and leg weakness. EXAM: CT ANGIOGRAPHY HEAD AND NECK TECHNIQUE: Multidetector CT imaging of the head and neck was performed using the standard protocol during bolus administration of intravenous contrast. Multiplanar CT image reconstructions and MIPs were obtained to evaluate the vascular anatomy. Carotid stenosis measurements (when applicable) are obtained utilizing NASCET criteria, using the distal internal carotid diameter as the denominator. CONTRAST:  OMNIPAQUE IOHEXOL 350 MG/ML SOLN COMPARISON:  Noncontrast head CT performed earlier the same day 09/02/2020. Brain MRI 07/22/2020. CT angiogram head/neck 07/21/2020. FINDINGS: CTA NECK FINDINGS  Aortic arch: Standard aortic branching. Minimal calcified plaque within the distal innominate/proximal right subclavian artery. No hemodynamically significant innominate or proximal subclavian artery stenosis. Right carotid system: CCA and ICA patent within the neck without stenosis. No significant atherosclerotic disease. Left carotid system: CCA and ICA patent within the neck without stenosis. No significant atherosclerotic disease. Vertebral arteries: The right vertebral artery is dominant. Portions of the V2 left vertebral artery are obscured by streak artifact arising from spinal fusion hardware. Within this limitation, the vertebral arteries are patent within the neck bilaterally without significant stenosis. Skeleton: No acute bony abnormality or aggressive osseous lesion. Sequela of prior fusion at the C4-C7 levels. Other neck: No neck mass or cervical lymphadenopathy. Upper chest: No consolidation within the imaged lung apices. Review of the MIP images confirms the above findings CTA HEAD FINDINGS Anterior circulation: The intracranial internal carotid arteries are patent. The M1 middle cerebral arteries are patent without significant stenosis. No M2 proximal branch occlusion is identified. Atherosclerotic irregularity of M2 and more distal MCA branch vessels bilaterally. Most notably, there has been apparent interval progression of a now high-grade focal stenosis within a proximal M2 right MCA branch vessel (series 9, image 15). The anterior cerebral arteries are patent. No intracranial aneurysm is identified. Posterior circulation: The intracranial vertebral arteries are patent. The basilar artery is patent. The posterior cerebral arteries are patent. Apparent moderate/severe stenosis within the right posterior cerebral artery at the P3/P4 junction, not appreciated on the prior examination of 07/21/2020. (Series 10, image 56) (series 9, image 20). A right posterior communicating artery is present. A small  left posterior communicating artery is likely present. Venous sinuses: Within limitations of contrast timing, no convincing thrombus. Anatomic variants: As described. Review of the MIP images confirms the above findings IMPRESSION: CTA neck: Streak artifact arising from cervical spinal fusion hardware partially obscures portions of the V2 left vertebral artery. Within this limitation, the common carotid, internal carotid and vertebral arteries are patent within the neck without evidence of hemodynamically significant stenosis. CTA head: 1. No intracranial large vessel occlusion. 2. Atherosclerotic irregularity of M2 and more distal MCA branch vessels bilaterally. Most notably, there is a progressive high-grade focal stenosis within a proximal M2 right MCA branch vessel. 3. Apparent moderate/severe focal stenosis within the right posterior cerebral artery at the P3/P4 junction, which appears new. Electronically Signed: By: Jackey Loge DO On: 09/02/2020 14:47    Scheduled Meds: .  stroke: mapping our early stages of recovery book   Does not apply Once  . acetaminophen  650 mg Oral Q8H  . atorvastatin  80 mg Oral QHS  . Chlorhexidine Gluconate Cloth  6 each Topical Daily  . clopidogrel  75 mg Oral Daily  . fenofibrate  160 mg Oral Daily  . fluticasone furoate-vilanterol  1 puff Inhalation Daily  . gabapentin  600 mg Oral BID  . heparin  5,000 Units Subcutaneous Q12H  . insulin  aspart  0-15 Units Subcutaneous TID WC  . latanoprost  1 drop Both Eyes QHS  . levETIRAcetam  500 mg Oral BID  . metoprolol tartrate  25 mg Oral BID  . nortriptyline  100 mg Oral QHS  . pantoprazole  40 mg Oral BID  . sertraline  50 mg Oral Daily  . timolol  1 drop Both Eyes BID  . [START ON 09/09/2020] Vitamin D (Ergocalciferol)  50,000 Units Oral Q Mon  . zonisamide  200 mg Oral BID   Continuous Infusions: PRN Meds:.acetaminophen **OR** acetaminophen (TYLENOL) oral liquid 160 mg/5 mL **OR** acetaminophen, albuterol,  hydrALAZINE, HYDROcodone-acetaminophen, nitroGLYCERIN, polyethylene glycol, senna-docusate, SUMAtriptan  CARDIAC STUDIES:  EKG 09/02/2020: Sinus rhythm.  Leftward axis.  Nonspecific T wave abnormality.  Echocardiogram 09/03/2020:  1. Left ventricular ejection fraction, by estimation, is 45 to 50%. The  left ventricle has mildly decreased function. The left ventricle  demonstrates regional wall motion abnormalities (see scoring  diagram/findings for description). There is mild left  ventricular hypertrophy. Left ventricular diastolic parameters are  consistent with Grade I diastolic dysfunction (impaired relaxation). There  is moderate hypokinesis of the left ventricular, basal-mid inferolateral  wall and inferior wall suggesting  possible circumflex territory ischemia  2. Right ventricular systolic function is mildly reduced. The right  ventricular size is normal. There is normal pulmonary artery systolic  pressure. The estimated right ventricular systolic pressure is 23.2 mmHg.  3. The mitral valve is abnormal. Trivial mitral valve regurgitation.  4. The aortic valve is tricuspid. Aortic valve regurgitation is not  visualized.  5. The inferior vena cava is normal in size with greater than 50%  respiratory variability, suggesting right atrial pressure of 3 mmHg.   Comparison(s): Changes from prior study are noted. 07/05/2017: LVEF 60-65%.     Assessment & Recommendations:  59 y.o. 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  Cardiology consulted due to abnormal echocardiogram and chest pain.  Chest pain: Atypical. Borderline abnormalities om echocardiogram. Risk factors for CAD include prior cocaine and tobacco abuse.  Will obtain lexiscan nuclear stress test on 9/29.    Elder Negus, MD Pager: [phone] Office: [phone]

## 2020-09-03 NOTE — Progress Notes (Signed)
Inpatient Rehab Admissions Coordinator:   Per PT recommendations pt was screened for CIR appropriateness.  Note limited eval and working diagnosis of migraines.  Will continue to follow for further workup, as this is not a diagnosis amenable to rehab, and progress with therapy.  Will not request a consult order at this time.   Estill Dooms, PT, DPT Admissions Coordinator [phone] 09/03/20  1:51 PM

## 2020-09-03 NOTE — Progress Notes (Signed)
EEG complete - results pending 

## 2020-09-03 NOTE — ED Notes (Signed)
Pt c/o feeling like he has to urinate, but unable to urinate. Bladder scan revealed . Admitting MD notified.

## 2020-09-03 NOTE — Progress Notes (Signed)
Pt requested for the use of CPAP, order placed per protocol and respiratory staff called and notified of pt's request. Obasogie-Asidi, Jahlon Baines Efe

## 2020-09-03 NOTE — Progress Notes (Signed)
Pt admitted to 3W19. Alert and oriented.  Foley catheter placed per order.  Tele box 31 placed on patient and CCMD called and verified.  Pt verbalizes understanding to call before getting OOB.  Call bell within reach; bed alarm set.

## 2020-09-04 ENCOUNTER — Inpatient Hospital Stay (HOSPITAL_COMMUNITY): Payer: Medicare Other

## 2020-09-04 DIAGNOSIS — R531 Weakness: Secondary | ICD-10-CM

## 2020-09-04 DIAGNOSIS — R569 Unspecified convulsions: Secondary | ICD-10-CM

## 2020-09-04 LAB — URINE CULTURE: Culture: NO GROWTH

## 2020-09-04 LAB — RAPID URINE DRUG SCREEN, HOSP PERFORMED
Amphetamines: NOT DETECTED
Barbiturates: NOT DETECTED
Benzodiazepines: NOT DETECTED
Cocaine: NOT DETECTED
Opiates: POSITIVE — AB
Tetrahydrocannabinol: NOT DETECTED

## 2020-09-04 LAB — GLUCOSE, CAPILLARY
Glucose-Capillary: 129 mg/dL — ABNORMAL HIGH (ref 70–99)
Glucose-Capillary: 153 mg/dL — ABNORMAL HIGH (ref 70–99)
Glucose-Capillary: 167 mg/dL — ABNORMAL HIGH (ref 70–99)
Glucose-Capillary: 203 mg/dL — ABNORMAL HIGH (ref 70–99)

## 2020-09-04 MED ORDER — TAMSULOSIN HCL 0.4 MG PO CAPS
0.4000 mg | ORAL_CAPSULE | Freq: Every day | ORAL | Status: DC
  Administered 2020-09-04 – 2020-09-05 (×2): 0.4 mg via ORAL
  Filled 2020-09-04 (×2): qty 1

## 2020-09-04 MED ORDER — REGADENOSON 0.4 MG/5ML IV SOLN: 0.4000 mg | Freq: Once | INTRAVENOUS | Status: AC

## 2020-09-04 MED ORDER — TECHNETIUM TC 99M TETROFOSMIN IV KIT
31.0000 | PACK | Freq: Once | INTRAVENOUS | Status: AC | PRN
  Administered 2020-09-04: 31 via INTRAVENOUS

## 2020-09-04 MED ORDER — TECHNETIUM TC 99M TETROFOSMIN IV KIT
10.8000 | PACK | Freq: Once | INTRAVENOUS | Status: AC | PRN
  Administered 2020-09-04: 10.8 via INTRAVENOUS

## 2020-09-04 MED ORDER — REGADENOSON 0.4 MG/5ML IV SOLN
INTRAVENOUS | Status: AC
  Administered 2020-09-04: 0.4 mg via INTRAVENOUS
  Filled 2020-09-04: qty 5

## 2020-09-04 NOTE — Progress Notes (Signed)
Reviewed all relevant test.  -Relevant labs include -creatinine 1.64 -CT head shows-stable and normal noncontrast CT appearance with aspects of 10 -CTA head neck-no intracranial large vessel occlusion.  Atherosclerotic irregularity at M2 and more distal MCA branch vessels bilaterally.  Most notably, there is progressive high-grade focal stenosis within the proximal M2 right MCA branch vessel. -EEG-This study is within normal limits. No seizures or epileptiform discharges were seen throughout the recording. -MRI brain negative  As noted previously given the subjective history of abnormal taste in his mouth followed by severe headache and left-sided deficit as far as sensation of weakness, with a history of migraine headache and negative neurological diagnostic tests most likely complicated migraine.  Neurology will sign off at this time.  Please call with any questions.

## 2020-09-04 NOTE — TOC Initial Note (Signed)
Transition of Care [facility]) - Initial/Assessment Note    Patient Details  Name: Nicholas [name] MRN: [id number] Date of Birth: 1961/10/26  Transition [facility]) CM/SW Contact:    Pollie Friar, RN Phone Number: 09/04/2020, 3:34 PM  Clinical Narrative:                 Recommendations are for SNF rehab. CM met with the patient and he is agreeable to being faxed out in the [facility] area. He also asked to be faxed out in [place] and a facility in [place].  PASAR went to review.  Insurance information faxed in for authorization. Pt has received both of the Moderna covid vaccines.  TOC following.  Expected Discharge Plan: Skilled Nursing Facility Barriers to Discharge: [place] ([facility]), Insurance Authorization   Patient Goals and CMS Choice   CMS Medicare.gov Compare Post Acute Care list provided to:: Patient Choice offered to / list presented to : Patient  Expected Discharge Plan and Services Expected Discharge Plan: [place] In-house Referral: Clinical Social Work Discharge Planning Services: CM Consult Post Acute Care Choice: [place] Living arrangements for the past 2 months: Apartment Expected Discharge Date: 09/04/20                                    Prior Living Arrangements/Services Living arrangements for the past 2 months: Apartment Lives with:: Self Patient language and need for interpreter reviewed:: Yes Do you feel safe going back to the place where you live?: Yes      Need for Family Participation in Patient Care: Yes (Comment) Care giver support system in place?: No (comment)   Criminal Activity/Legal Involvement Pertinent to Current Situation/Hospitalization: No - Comment as needed  Activities of Daily Living      Permission Sought/Granted                  Emotional Assessment Appearance:: Appears stated age Attitude/Demeanor/Rapport: Engaged Affect (typically observed):  Accepting Orientation: : Oriented to Self, Oriented to Place, Oriented to  Time, Oriented to Situation   Psych Involvement: No (comment)  Admission diagnosis:  CVA (cerebral vascular accident) ([place]) [I63.9] Left-sided weakness [R53.1] Patient Active Problem List   Diagnosis Date Noted  . Precordial pain   . CVA (cerebral vascular accident) ([place]) 09/02/2020  . Altered mental status 07/23/2020  . Seizure ([place]) 07/22/2020  . Left-sided weakness 07/04/2020  . Dysarthria 07/04/2020  . Essential hypertension   . Seizure disorder ([place]) 07/02/2017  . Insulin-requiring or dependent type II diabetes mellitus ([place]) 07/02/2017  . CKD (chronic kidney disease), stage III 07/02/2017  . Normocytic anemia 07/02/2017  . Testicular/scrotal pain 11/09/2013  . Microhematuria 11/09/2013  . Condyloma acuminatum of scrotum 11/09/2013   PCP:  Patient, No Pcp Per Pharmacy:   [id number] - [place], [place] [address] [place] [address] Phone: [phone] Fax: [phone]  [facility] [place], [place] - [address] [place] [address] Phone: [phone] Fax: [phone]  [place], [place] [address] Phone: [phone] Fax: [phone]     Social Determinants of Health (SDOH) Interventions    Readmission Risk Interventions No flowsheet data found.

## 2020-09-04 NOTE — Evaluation (Signed)
Occupational Therapy Evaluation Patient Details Name: Nicholas [name] MRN: [id number] DOB: [birth year] Today's Date: 09/04/2020    History of Present Illness Pt is a 60 y.o. male with medical history significant of hypertension, hyperthyroidism, DM type II, bipolar 1 disorder, migraine headaches, seizure disorder, and history of COVID-19 infection in 2020 presented with complaints of L-sided weakness and slurred speech. CT scan and MRI of the brain negative for any acute abnormalities for same problem in 8/21. MRI neg this admission as well and possibility is migraines. Of note, pt had just run out of his migraine medication   Clinical Impression   PT admitted with complex migraine. Pt currently with functional limitiations due to the deficits listed below (see OT problem list). Pt prior to admission was mod I with cooking cleaning and self care. Pt reports missing medications at home prior to admission. Pt reports recently getting pill box for medications. Pt with urinal retention now that is new this admission.  Pt will benefit from skilled OT to increase their independence and safety with adls and balance to allow discharge SNF. Pt wants to d/c SNF to be able to d/c back home mod I .      Follow Up Recommendations  SNF    Equipment Recommendations  None recommended by OT    Recommendations for Other Services Other (comment) (CNA and RN)     Precautions / Restrictions Precautions Precautions: Fall Restrictions Weight Bearing Restrictions: No      Mobility Bed Mobility Overal bed mobility: Needs Assistance Bed Mobility: Supine to Sit     Supine to sit: Supervision     General bed mobility comments: in chair on arrival  Transfers Overall transfer level: Needs assistance Equipment used: Rolling walker (2 wheeled) Transfers: Sit to/from Stand Sit to Stand: Min guard         General transfer comment: holding surfaces to walk to sink instead of DME    Balance Overall  balance assessment: Needs assistance Sitting-balance support: Single extremity supported;Feet unsupported Sitting balance-Leahy Scale: Fair     Standing balance support: During functional activity;No upper extremity supported;Bilateral upper extremity supported Standing balance-Leahy Scale: Fair Standing balance comment: static stand without UE support, RW for amb                           ADL either performed or assessed with clinical judgement   ADL Overall ADL's : Needs assistance/impaired Eating/Feeding: Set up   Grooming: Set up;Standing               Lower Body Dressing: Minimal assistance;Sit to/from stand Lower Body Dressing Details (indicate cue type and reason): pt uses figure 4 to dress LB socks Toilet Transfer: Min Pension scheme manager Details (indicate cue type and reason): furniture walking around the room         Functional mobility during ADLs: Min guard General ADL Comments: pt reports inability to void bladder despite feeling he need to void. pt with foley at this time.      Vision Baseline Vision/History: Glaucoma Patient Visual Report: No change from baseline       Perception     Praxis      Pertinent Vitals/Pain Pain Assessment: Faces Pain Score: 0-No pain Faces Pain Scale: Hurts little more Pain Location: LLE during MMT Pain Descriptors / Indicators: Heaviness Pain Intervention(s): Limited activity within patient's tolerance;Monitored during session;Repositioned     Hand Dominance Right   Extremity/Trunk Assessment Upper Extremity  Assessment Upper Extremity Assessment: Overall WFL for tasks assessed   Lower Extremity Assessment Lower Extremity Assessment: Defer to PT evaluation RLE Deficits / Details: reports weakness at knee and giving way at times LLE Deficits / Details: reports weakness compared to baseline   Cervical / Trunk Assessment Cervical / Trunk Assessment: Normal   Communication  Communication Communication: No difficulties   Cognition Arousal/Alertness: Awake/alert Behavior During Therapy: WFL for tasks assessed/performed Overall Cognitive Status: Impaired/Different from baseline Area of Impairment: Memory;Safety/judgement                     Memory: Decreased short-term memory   Safety/Judgement: Decreased awareness of deficits;Decreased awareness of safety     General Comments: pt is able to accuractely verbalize events this admission. pt requesting CNA for d/c planning but states he needs rehab prior to home due to fall risk. Pt agreeable to SNF prior to home   General Comments  pt reports missing one medication for 2 weeks and missing a different medication for 3 days and not realizing it until he counted up his pills    Exercises     Shoulder Instructions      Home Living Family/patient expects to be discharged to:: Skilled nursing facility   Available Help at Discharge: Family Type of Home: Apartment                           Additional Comments: reports living in apartment with issues getting complex to change out locks of back sliding door to decrease need to use stairs. pt reports pending grab bar in apartment bathroom. pt reports that he likes living alone. Pt keeps part of medications at [facility] house. pt reports missing medications and the pastor wife helps him a few times a week on medicaiton management. sister Dedra Skeens is POA for decisions  Lives With: Alone    Prior Functioning/Environment Level of Independence: Independent        Comments: has been using cane. pt reports not DME in the home and several falls in bathroom        OT Problem List: Decreased strength;Decreased range of motion;Decreased activity tolerance;Impaired balance (sitting and/or standing);Decreased coordination;Decreased cognition;Decreased safety awareness;Decreased knowledge of use of DME or AE;Decreased knowledge of precautions;Pain;Impaired  sensation      OT Treatment/Interventions: Self-care/ADL training;Therapeutic exercise;Neuromuscular education;Energy conservation;DME and/or AE instruction;Manual therapy;Cognitive remediation/compensation;Therapeutic activities;Patient/family education;Balance training    OT Goals(Current goals can be found in the care plan section) Acute Rehab OT Goals Patient Stated Goal: Rehab and then home - be able to take care of himself OT Goal Formulation: With patient Time For Goal Achievement: 08/02/20 Potential to Achieve Goals: Good  OT Frequency: Min 2X/week   Barriers to D/C: Decreased caregiver support          Co-evaluation              AM-PAC OT "6 Clicks" Daily Activity     Outcome Measure Help from another person eating meals?: None Help from another person taking care of personal grooming?: A Little Help from another person toileting, which includes using toliet, bedpan, or urinal?: A Little Help from another person bathing (including washing, rinsing, drying)?: A Lot Help from another person to put on and taking off regular upper body clothing?: A Little Help from another person to put on and taking off regular lower body clothing?: A Little 6 Click Score: 18   End of Session Nurse Communication: Mobility  status;Precautions  Activity Tolerance: Patient tolerated treatment well Patient left: in chair;with call bell/phone within reach  OT Visit Diagnosis: Unsteadiness on feet (R26.81);Other abnormalities of gait and mobility (R26.89);Muscle weakness (generalized) (M62.81);Other symptoms and signs involving the nervous system (R29.898);Other symptoms and signs involving cognitive function;Pain Pain - Right/Left: Left                Time: 1440-1505 OT Time Calculation (min): 25 min Charges:  OT General Charges $OT Visit: 1 Visit OT Evaluation $OT Eval Moderate Complexity: 1 Mod   Brynn, OTR/L  Acute Rehabilitation Services Pager: [phone] Office:  [phone] .   Mateo Flow 09/04/2020, 4:09 PM

## 2020-09-04 NOTE — Progress Notes (Signed)
[facility] PROGRESS NOTE    Nicholas [name]  TTS:[id number] DOB: 1961-08-17 DOA: 09/02/2020 PCP: Patient, No Pcp Per      Brief Narrative:  Nicholas [name] is a 59 y.o. 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  In the ER CT head and CTA head and neck were unremarkable.  Neurology were consulted, who suspected complex migraine.       Assessment & Plan:  Complex migraine causing hemiparesis She was given Compazine and Benadryl IV, and IV fluids.  His headache improved.  An MRI brain was unremarkable.  Stroke was ruled out.  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  Incidentally noted to have EF 45%.  Chest pain noted, functional testing negative. Beta-blockade prevented by slow heart rate/bradycardia and hypotension. -Hold BB -Hold ARB -Follow up with Cardiology at discharge  Cerebrovascular disease, secondary prevention Hypertension Blood pressure low normal -Hold valsartan -Continue Plavix, fibrate, atorvastatin  Urinary retention -Continue Flomax -Voiding trial  Diabetes Blood sugars controlled -Continue sliding scale corrections  History of seizures No seizures here.  EEG unremarkable. -Continue Keppra, gabapentin, zonisamide   GERD -Continue PPI  Glaucoma -Continue eyedrops  Depression -Continue sertraline         Disposition: Status is: Inpatient  The patient presented with weakness and mental status cjhanges.  His mental status changes have resolved, but the patient remains very weak, and on physical therapy assessment, appears to be dependent for ambulation, ADLs, which is a significant change from his baseline at which time he was independent.  The patient has had a migraine, and appears to need  some rehabilitation, we will evaluate whether he can qualify for short rehab stay, prior to discharge home.   Dispo: The patient is from: Home              Anticipated d/c is to: To be determined              Anticipated d/c date is: 1 day              Patient currently is medically stable to d/c.  But presently lacks safe discharge disposition              MDM: The below labs and imaging reports were reviewed and summarized above.  Medication management as above.     DVT prophylaxis: heparin injection 5,000 Units Start: 09/02/20 2200  Code Status: Full code Family Communication: Sister    Consultants:   Neurology          Subjective: Patient has no headache, vision changes, confusion.  He has generalized weakness.  He has no dizziness.  Chest pain is resolved, no trouble breathing, no swelling, no orthopnea.  Objective: Vitals:   09/04/20 1103 09/04/20 1105 09/04/20 1107 09/04/20 1249  BP: 114/71 105/71 104/73 110/76  Pulse:    66  Resp:    18  Temp:    98.7 F (37.1 C)  TempSrc:    Oral  SpO2:    99%  Weight:      Height:        Intake/Output Summary (Last 24 hours) at 09/04/2020 1549 Last data filed at 09/04/2020 0354 Gross per 24 hour  Intake -  Output 3100 ml  Net -3100 ml   Filed Weights   09/02/20 1429  Weight: 86 kg    Examination: General appearance:  adult male, alert and in no obvious distress.   HEENT: Anicteric, conjunctiva pink, lids and lashes normal. No nasal deformity, discharge, epistaxis.  Lips moist.   Skin: Warm and dry.  No jaundice.  No suspicious rashes or lesions. Cardiac: RRR, nl S1-S2, no murmurs appreciated.  Capillary refill is brisk.  JVP normal.  No LE edema.  Radial pulses 2+ and symmetric. Respiratory: Normal respiratory rate and rhythm.  CTAB without rales or wheezes. Abdomen: Abdomen soft.  No TTP or guarding. No ascites, distension, hepatosplenomegaly.   MSK: No deformities or effusions. Neuro: Awake and  alert.  EOMI, moves all extremities. Speech fluent.    Psych: Sensorium intact and responding to questions, attention normal. Affect normal.  Judgment and insight appear normal.    Data Reviewed: I have personally reviewed following labs and imaging studies:  CBC: Recent Labs  Lab 09/02/20 1355 09/02/20 1414  WBC 5.8  --   NEUTROABS 3.7  --   HGB 12.7* 13.6  HCT 42.2 40.0  MCV 91.3  --   PLT 166  --    Basic Metabolic Panel: Recent Labs  Lab 09/02/20 1355 09/02/20 1414 09/03/20 1910  NA 143 146* 138  K 4.0 4.0 3.9  CL 112* 112* 109  CO2 19*  --  20*  GLUCOSE 153* 145* 185*  BUN CREATININE 1.64* 1.60* 1.40*  CALCIUM 9.8  --  9.0   GFR: Estimated Creatinine Clearance: 60.3 mL/min (A) (by C-G formula based on SCr of 1.4 mg/dL (H)). Liver Function Tests: Recent Labs  Lab 09/02/20 1355  AST 29  ALT 35  ALKPHOS 60  BILITOT 0.4  PROT 7.4  ALBUMIN 4.3   No results for input(s): LIPASE, AMYLASE in the last 168 hours. No results for input(s): AMMONIA in the last 168 hours. Coagulation Profile: Recent Labs  Lab 09/02/20 1355  INR 1.1   Cardiac Enzymes: No results for input(s): CKTOTAL, CKMB, CKMBINDEX, TROPONINI in the last 168 hours. BNP (last 3 results) No results for input(s): PROBNP in the last 8760 hours. HbA1C: Recent Labs    09/03/20 0503  HGBA1C 7.1*   CBG: Recent Labs  Lab 09/03/20 1254 09/03/20 2128 09/04/20 0623 09/04/20 1243 09/04/20 1547  GLUCAP 125* 180* 129* 153* 167*   Lipid Profile: Recent Labs    09/03/20 0503  CHOL 79  HDL 25*  LDLCALC 34  TRIG 161  CHOLHDL 3.2   Thyroid Function Tests: No results for input(s): TSH, T4TOTAL, FREET4, T3FREE, THYROIDAB in the last 72 hours. Anemia Panel: No results for input(s): VITAMINB12, FOLATE, FERRITIN, TIBC, IRON, RETICCTPCT in the last 72 hours. Urine analysis:    Component Value Date/Time   COLORURINE STRAW (A) 09/03/2020 1722   APPEARANCEUR CLEAR 09/03/2020 1722    LABSPEC 1.008 09/03/2020 1722   PHURINE 5.0 09/03/2020 1722   GLUCOSEU >=500 (A) 09/03/2020 1722   HGBUR NEGATIVE 09/03/2020 1722   BILIRUBINUR NEGATIVE 09/03/2020 1722   KETONESUR NEGATIVE 09/03/2020 1722   PROTEINUR NEGATIVE 09/03/2020 1722   UROBILINOGEN 0.2 08/29/2015 1125   NITRITE NEGATIVE 09/03/2020 1722   LEUKOCYTESUR NEGATIVE 09/03/2020 1722   Sepsis Labs: (procalcitonin:4,lacticacidven:4)  ) Recent Results (from the past 240 hour(s))  Respiratory Panel by RT PCR (Flu A&B, Covid) - Nasopharyngeal Swab     Status: None   Collection Time: 09/02/20  2:36 PM   Specimen: Nasopharyngeal Swab  Result Value Ref Range Status   SARS Coronavirus  2 by RT PCR NEGATIVE NEGATIVE Final    Comment: (NOTE) SARS-CoV-2 target nucleic acids are NOT DETECTED.  The SARS-CoV-2 RNA is generally detectable in upper respiratoy specimens during the acute phase of infection. The lowest concentration of SARS-CoV-2 viral copies this assay can detect is 131 copies/mL. A negative result does not preclude SARS-Cov-2 infection and should not be used as the sole basis for treatment or other patient management decisions. A negative result may occur with  improper specimen collection/handling, submission of specimen other than nasopharyngeal swab, presence of viral mutation(s) within the areas targeted by this assay, and inadequate number of viral copies (<131 copies/mL). A negative result must be combined with clinical observations, patient history, and epidemiological information. The expected result is Negative.  Fact Sheet for Patients:  https://www.moore.com/  Fact Sheet for Healthcare Providers:  https://www.young.biz/  This test is no t yet approved or cleared by the [place] FDA and  has been authorized for detection and/or diagnosis of SARS-CoV-2 by FDA under an Emergency Use Authorization (EUA). This EUA will remain  in effect (meaning  this test can be used) for the duration of the COVID-19 declaration under Section 564(b)(1) of the Act, 21 U.S.C. section 360bbb-3(b)(1), unless the authorization is terminated or revoked sooner.     Influenza A by PCR NEGATIVE NEGATIVE Final   Influenza B by PCR NEGATIVE NEGATIVE Final    Comment: (NOTE) The Xpert Xpress SARS-CoV-2/FLU/RSV assay is intended as an aid in  the diagnosis of influenza from Nasopharyngeal swab specimens and  should not be used as a sole basis for treatment. Nasal washings and  aspirates are unacceptable for Xpert Xpress SARS-CoV-2/FLU/RSV  testing.  Fact Sheet for Patients: https://www.moore.com/  Fact Sheet for Healthcare Providers: https://www.young.biz/  This test is not yet approved or cleared by the [place] FDA and  has been authorized for detection and/or diagnosis of SARS-CoV-2 by  FDA under an Emergency Use Authorization (EUA). This EUA will remain  in effect (meaning this test can be used) for the duration of the  Covid-19 declaration under Section 564(b)(1) of the Act, 21  U.S.C. section 360bbb-3(b)(1), unless the authorization is  terminated or revoked. Performed at [facility], [address], [place], [place] [address]   Urine Culture     Status: None   Collection Time: 09/03/20  3:38 PM   Specimen: Urine, Random  Result Value Ref Range Status   Specimen Description URINE, RANDOM  Final   Special Requests NONE  Final   Culture   Final    NO GROWTH Performed at [facility], [address] [place], [place] [address]    Report Status 09/04/2020 FINAL  Final         Radiology Studies: MR BRAIN WO CONTRAST  Result Date: 09/02/2020 CLINICAL DATA:  Acute neuro deficit.  Rule out stroke. EXAM: MRI HEAD WITHOUT CONTRAST TECHNIQUE: Multiplanar, multiecho pulse sequences of the brain and surrounding structures were obtained without intravenous contrast. COMPARISON:  CT angio  head and neck 09/02/2020 FINDINGS: Brain: No acute infarction, hemorrhage, hydrocephalus, extra-axial collection or mass lesion. Vascular: Normal arterial flow voids. Skull and upper cervical spine: Negative Sinuses/Orbits: Paranasal sinuses clear.  Negative orbit Other: None IMPRESSION: Negative MRI head Electronically Signed   By: Marlan Palau M.D.   On: 09/02/2020 18:00   NM Myocar Multi W/Spect W/Wall Motion / EF  Result Date: 09/04/2020 CLINICAL DATA:  Chest pain.  59 year old male. EXAM: MYOCARDIAL IMAGING WITH SPECT (REST  AND PHARMACOLOGIC-STRESS) GATED LEFT VENTRICULAR WALL MOTION STUDY LEFT VENTRICULAR EJECTION FRACTION TECHNIQUE: Standard myocardial SPECT imaging was performed after resting intravenous injection of 10 mCi Tc-5068m tetrofosmin. Subsequently, intravenous infusion of Lexiscan was performed under the supervision of the Cardiology staff. At peak effect of the drug, 30 mCi Tc-5468m tetrofosmin was injected intravenously and standard myocardial SPECT imaging was performed. Quantitative gated imaging was also performed to evaluate left ventricular wall motion, and estimate left ventricular ejection fraction. COMPARISON:  None. FINDINGS: Perfusion: No decreased activity in the left ventricle on stress imaging to suggest reversible ischemia or infarction. Wall Motion: Normal left ventricular wall motion. No left ventricular dilation. Left Ventricular Ejection Fraction: 46 % End diastolic volume 96 ml End systolic volume 51 ml IMPRESSION: 1. No reversible ischemia or infarction. 2. Normal left ventricular wall motion. 3. Left ventricular ejection fraction 46% 4. Non invasive risk stratification*: Low *2012 Appropriate Use Criteria for Coronary Revascularization Focused Update: J Am Coll Cardiol. 2012;59(9):857-881. http://content.dementiazones.comonlinejacc.org/article.aspx?articleid=1201161 Electronically Signed   By: Genevive BiStewart  Edmunds M.D.   On: 09/04/2020 12:52   EEG adult  Result Date: 09/04/2020 Charlsie QuestYadav,  Priyanka O, MD     09/04/2020  8:50 AM Patient Name: Nicholas [name] MRN: [id number] Epilepsy Attending: Charlsie QuestPriyanka O Yadav Referring Physician/Provider: Dr. Hartley BarefootBelkys Regalado Date: 09/03/2020 Duration: 27.34 mins Patient history: 59 year old male with history of nonepileptic events presented with left-sided weakness and speech disturbance.  EEG to evaluate for seizure. Level of alertness: Awake AEDs during EEG study: Keppra, gabapentin, zonisamide Technical aspects: This EEG study was done with scalp electrodes positioned according to the 10-20 International system of electrode placement. Electrical activity was acquired at a sampling rate of 500Hz  and reviewed with a high frequency filter of 70Hz  and a low frequency filter of 1Hz . EEG data were recorded continuously and digitally stored. Description: The posterior dominant rhythm consists of 8-9 Hz activity of moderate voltage (25-35 uV) seen predominantly in posterior head regions, symmetric and reactive to eye opening and eye closing.  Hyperventilation and photic stimulation were not performed.   IMPRESSION: This study is within normal limits. No seizures or epileptiform discharges were seen throughout the recording. Charlsie Questriyanka O Yadav   ECHOCARDIOGRAM COMPLETE  Result Date: 09/03/2020    ECHOCARDIOGRAM REPORT   Patient Name:   Nicholas [name] Date of Exam: 09/03/2020 Medical Rec #:  [id number]      Height:       67.0 in Accession #:    [id number]     Weight:       189.6 lb Date of Birth:  15-Aug-1961     BSA:          1.977 m Patient Age:    58 years       BP:           121/85 mmHg Patient Gender: M              HR:           65 bpm. Exam Location:  Inpatient Procedure: 2D Echo, Cardiac Doppler and Color Doppler Indications:    TIA 435.9 / G45.9  History:        Patient has prior history of Echocardiogram examinations, most                 recent 07/05/2017. Risk Factors:Diabetes and Hypertension.                 Epilepsy. Seizures. GERD. Hyperthyroidism.  Sonographer:     Advertising copywriterTiffany Dance Referring  Phys: [id number] Emeline General IMPRESSIONS  1. Left ventricular ejection fraction, by estimation, is 45 to 50%. The left ventricle has mildly decreased function. The left ventricle demonstrates regional wall motion abnormalities (see scoring diagram/findings for description). There is mild left ventricular hypertrophy. Left ventricular diastolic parameters are consistent with Grade I diastolic dysfunction (impaired relaxation). There is moderate hypokinesis of the left ventricular, basal-mid inferolateral wall and inferior wall suggesting possible circumflex territory ischemia  2. Right ventricular systolic function is mildly reduced. The right ventricular size is normal. There is normal pulmonary artery systolic pressure. The estimated right ventricular systolic pressure is 23.2 mmHg.  3. The mitral valve is abnormal. Trivial mitral valve regurgitation.  4. The aortic valve is tricuspid. Aortic valve regurgitation is not visualized.  5. The inferior vena cava is normal in size with greater than 50% respiratory variability, suggesting right atrial pressure of 3 mmHg. Comparison(s): Changes from prior study are noted. 07/05/2017: LVEF 60-65%. FINDINGS  Left Ventricle: Left ventricular ejection fraction, by estimation, is 45 to 50%. The left ventricle has mildly decreased function. The left ventricle demonstrates regional wall motion abnormalities. Moderate hypokinesis of the left ventricular, basal-mid inferolateral wall and inferior wall. The left ventricular internal cavity size was normal in size. There is mild left ventricular hypertrophy. Left ventricular diastolic parameters are consistent with Grade I diastolic dysfunction (impaired relaxation). Indeterminate filling pressures. Right Ventricle: The right ventricular size is normal. No increase in right ventricular wall thickness. Right ventricular systolic function is mildly reduced. There is normal pulmonary artery systolic pressure. The  tricuspid regurgitant velocity is 2.25 m/s, and with an assumed right atrial pressure of 3 mmHg, the estimated right ventricular systolic pressure is 23.2 mmHg. Left Atrium: Left atrial size was normal in size. Right Atrium: Right atrial size was normal in size. Pericardium: There is no evidence of pericardial effusion. Mitral Valve: The mitral valve is abnormal. There is moderate thickening of the mitral valve leaflet(s). Trivial mitral valve regurgitation. Tricuspid Valve: The tricuspid valve is grossly normal. Tricuspid valve regurgitation is trivial. Aortic Valve: The aortic valve is tricuspid. Aortic valve regurgitation is not visualized. Pulmonic Valve: The pulmonic valve was grossly normal. Pulmonic valve regurgitation is trivial. Aorta: The aortic root and ascending aorta are structurally normal, with no evidence of dilitation. Venous: The inferior vena cava is normal in size with greater than 50% respiratory variability, suggesting right atrial pressure of 3 mmHg. IAS/Shunts: No atrial level shunt detected by color flow Doppler.  LEFT VENTRICLE PLAX 2D LVIDd:         4.70 cm  Diastology LVIDs:         2.80 cm  LV e' medial:    6.42 cm/s LV PW:         1.20 cm  LV E/e' medial:  9.2 LV IVS:        1.20 cm  LV e' lateral:   8.49 cm/s LVOT diam:     2.30 cm  LV E/e' lateral: 7.0 LV SV:         64 LV SV Index:   32 LVOT Area:     4.15 cm  RIGHT VENTRICLE            IVC RV Basal diam:  3.00 cm    IVC diam: 1.80 cm RV S prime:     7.35 cm/s TAPSE (M-mode): 1.8 cm LEFT ATRIUM             Index  RIGHT ATRIUM           Index LA diam:        3.80 cm 1.92 cm/m  RA Area:     18.00 cm LA Vol (A2C):   49.7 ml 25.14 ml/m RA Volume:   47.30 ml  23.93 ml/m LA Vol (A4C):   45.4 ml 22.97 ml/m LA Biplane Vol: 49.6 ml 25.09 ml/m  AORTIC VALVE LVOT Vmax:   69.80 cm/s LVOT Vmean:  50.400 cm/s LVOT VTI:    0.154 m  AORTA Ao Root diam: 3.70 cm Ao Asc diam:  3.60 cm MITRAL VALVE               TRICUSPID VALVE MV Area  (PHT): 2.95 cm    TR Peak grad:   20.2 mmHg MV Decel Time: 257 msec    TR Vmax:        225.00 cm/s MV E velocity: 59.10 cm/s MV A velocity: 59.10 cm/s  SHUNTS MV E/A ratio:  1.00        Systemic VTI:  0.15 m                            Systemic Diam: 2.30 cm Zoila Shutter MD Electronically signed by Zoila Shutter MD Signature Date/Time: 09/03/2020/11:45:14 AM    Final         Scheduled Meds: .  stroke: mapping our early stages of recovery book   Does not apply Once  . acetaminophen  650 mg Oral Q8H  . atorvastatin  80 mg Oral QHS  . Chlorhexidine Gluconate Cloth  6 each Topical Daily  . clopidogrel  75 mg Oral Daily  . fenofibrate  160 mg Oral Daily  . fluticasone furoate-vilanterol  1 puff Inhalation Daily  . gabapentin  600 mg Oral BID  . heparin  5,000 Units Subcutaneous Q12H  . insulin aspart  0-15 Units Subcutaneous TID WC  . latanoprost  1 drop Both Eyes QHS  . levETIRAcetam  500 mg Oral BID  . metoprolol tartrate  25 mg Oral BID  . nortriptyline  100 mg Oral QHS  . pantoprazole  40 mg Oral BID  . sertraline  50 mg Oral Daily  . tamsulosin  0.4 mg Oral QPC breakfast  . timolol  1 drop Both Eyes BID  . [START ON 09/09/2020] Vitamin D (Ergocalciferol)  50,000 Units Oral Q Mon  . zonisamide  200 mg Oral BID   Continuous Infusions:   LOS: 2 days    Time spent: 25 minutes    Alberteen Sam, MD [facility] 09/04/2020, 3:49 PM     Please page though AMION or Epic secure chat:  For [employer], Higher education careers adviser

## 2020-09-04 NOTE — Progress Notes (Addendum)
Name: Nicholas [name] DOB: March 27, 1961  Please be advised that the above-named patient has been suggested to have a short-term nursing home stay -- anticipated 30 days or less for rehabilitation and strengthening. The plan is for return home.

## 2020-09-04 NOTE — Procedures (Signed)
Patient Name: [name]  MRN: [id number]  Epilepsy Attending: Charlsie Quest  Referring Physician/Provider: Dr. Hartley Barefoot Date: 09/03/2020  Duration: 27.34 mins  Patient history: 59 year old male with history of nonepileptic events presented with left-sided weakness and speech disturbance.  EEG to evaluate for seizure.  Level of alertness: Awake  AEDs during EEG study: Keppra, gabapentin, zonisamide  Technical aspects: This EEG study was done with scalp electrodes positioned according to the 10-20 International system of electrode placement. Electrical activity was acquired at a sampling rate of 500Hz  and reviewed with a high frequency filter of 70Hz  and a low frequency filter of 1Hz . EEG data were recorded continuously and digitally stored.   Description: The posterior dominant rhythm consists of 8-9 Hz activity of moderate voltage (25-35 uV) seen predominantly in posterior head regions, symmetric and reactive to eye opening and eye closing.  Hyperventilation and photic stimulation were not performed.     IMPRESSION: This study is within normal limits. No seizures or epileptiform discharges were seen throughout the recording.  Karthika Glasper 

## 2020-09-04 NOTE — Evaluation (Signed)
Speech Language Pathology Evaluation Patient Details Name: Nicholas [name] MRN: [id number] DOB: 01/15/1961 Today's Date: 09/04/2020 Time: 3536-1443 SLP Time Calculation (min) (ACUTE ONLY): 14 min  Problem List:  Patient Active Problem List   Diagnosis Date Noted  . Precordial pain   . CVA (cerebral vascular accident) (HCC) 09/02/2020  . Altered mental status 07/23/2020  . Seizure (HCC) 07/22/2020  . Left-sided weakness 07/04/2020  . Dysarthria 07/04/2020  . Essential hypertension   . Seizure disorder (HCC) 07/02/2017  . Insulin-requiring or dependent type II diabetes mellitus (HCC) 07/02/2017  . CKD (chronic kidney disease), stage III 07/02/2017  . Normocytic anemia 07/02/2017  . Testicular/scrotal pain 11/09/2013  . Microhematuria 11/09/2013  . Condyloma acuminatum of scrotum 11/09/2013   Past Medical History:  Past Medical History:  Diagnosis Date  . Asthma   . Bipolar 1 disorder (HCC)   . Borderline glaucoma   . Chronic pain   . Epididymal pain    LEFT  . 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 Feeling of incomplete bladder emptying   . Frequency of urination   . Gastric ulcer   . GERD (gastroesophageal reflux disease)   . Hypertension   . Hyperthyroidism    NO MEDS   . Migraine   . Seizures (HCC)   . TIA (transient ischemic attack)   . Type 2 diabetes mellitus (HCC)    Past Surgical History:  Past Surgical History:  Procedure Laterality Date  . ABDOMINAL SURGERY    . ANTERIOR CERVICAL DECOMP/DISCECTOMY FUSION  2007   C4  --  C6  . CERVICAL FUSION    . CHOLECYSTECTOMY    . CYSTOSCOPY N/A 11/09/2013   Procedure: CYSTOSCOPY FLEXIBLE;  Surgeon: Bjorn Pippin, MD;  Location: [facility];  Service: Urology;  Laterality: N/A;  . EPIDIDYMECTOMY Left 11/09/2013   Procedure:  LEFT EPIDIDYMECTOMY;  Surgeon: Bjorn Pippin, MD;  Location: [facility];   Service: Urology;  Laterality: Left;  POSSIBLE OUTPATIENT WITH OBSERVATION  . EXCISION LIPOMA LEFT SHOULDER  2004 (APPROX)  . MULTIPLE CYST REMOVED FROM CHEST  AGE 34  . OTHER SURGICAL HISTORY     hemorroid surgery   . TESTICLE REMOVAL Left   . TONSILLECTOMY     HPI:  Pt is a 59 y.o. male with medical history significant of hypertension, hyperthyroidism, DM type II, bipolar 1 disorder, migraine headaches, seizure disorder, and history of COVID-19 infection in 2020 presented with complaints of L-sided weakness and slurred speech. CT scan and MRI of the brain negative for any acute abnormalities for same problem in 8/21. MRI neg this admission as well and possibility is migraines. EEG was WNL   Assessment / Plan / Recommendation Clinical Impression  Pt participated in speech/language evaluation. Pt reported that his memory is "not good" at baseline but he denied any acute changes in language or cognition and indicated that the dysarthria has resolved. Mild articulatory imprecision was noted but speech was adequately intelligible. Language skills were within normal limits and his cognitive-linguistic skills were within functional limits with minimal cues for accuracy with memory tasks. Further skilled SLP services are not clinically indicated at this time. Pt was educated regarding results and recommendations and he verbalized understanding as well as agreement with plan of care.    SLP Assessment  SLP Recommendation/Assessment: Patient does not need any further Speech Lanaguage  Pathology Services SLP Visit Diagnosis: Dysarthria and anarthria (R47.1)    Follow Up Recommendations  None    Frequency and Duration           SLP Evaluation Cognition  Overall Cognitive Status: Within Functional Limits for tasks assessed Arousal/Alertness: Awake/alert Orientation Level: Oriented X4 Attention: Focused Focused Attention: Appears intact Memory: Impaired Memory Impairment: Retrieval deficit  (Immedaite: delayed: 2/3; with cues; 1/1) Awareness: Appears intact Problem Solving: Appears intact Executive Function: Reasoning Reasoning: Appears intact       Comprehension  Auditory Comprehension Overall Auditory Comprehension: Appears within functional limits for tasks assessed Yes/No Questions: Within Functional Limits Basic Immediate Environment Questions:  (5/5) Complex Questions:  (5/5) Paragraph Comprehension (via yes/no questions):  (3/4) Commands: Within Functional Limits Two Step Basic Commands:  (3/3) Multistep Basic Commands:  (3/3) Conversation: Complex    Expression Expression Primary Mode of Expression: Verbal Verbal Expression Overall Verbal Expression: Appears within functional limits for tasks assessed Initiation: No impairment Automatic Speech: Counting;Day of week;Month of year (WN:) Level of Generative/Spontaneous Verbalization: Conversation Repetition: No impairment (5/5) Naming: No impairment (10/10) Pragmatics: No impairment   Oral / Motor  Oral Motor/Sensory Function Overall Oral Motor/Sensory Function: Within functional limits Motor Speech Overall Motor Speech: Appears within functional limits for tasks assessed Respiration: Within functional limits Phonation: Normal Resonance: Within functional limits Articulation: Impaired Level of Impairment: Conversation Intelligibility: Intelligible   Zinnia Tindall I. Vear Clock, MS, CCC-SLP Acute Rehabilitation Services Office number [phone] Pager [phone]                    Scheryl Marten 09/04/2020, 2:19 PM

## 2020-09-04 NOTE — Progress Notes (Signed)
Physical Therapy Treatment Patient Details Name: Nicholas [name] MRN: [id number] DOB: 1960/12/31 Today's Date: 09/04/2020    History of Present Illness Pt is a 59 y.o. male with medical history significant of hypertension, hyperthyroidism, DM type II, bipolar 1 disorder, migraine headaches, seizure disorder, and history of COVID-19 infection in 2020 presented with complaints of L-sided weakness and slurred speech. CT scan and MRI of the brain negative for any acute abnormalities for same problem in 8/21. MRI neg this admission as well and possibility is migraines. Of note, pt had just run out of his migraine medication    PT Comments    Pt progressing towards physical therapy goals. Was able to perform transfers and ambulation with gross min guard assist to supervision for safety with RW for support. Pt reporting several difficulties in caring for himself lately at his apartment, and 2 recent falls. Pt continues to be at a high risk for falls and feel he would be safer with the RW vs SPC at this time. Pt interested in continued skilled rehab at d/c to maximize safety and independence prior to return home to his apartment. He understands his diagnosis does not qualify him for CIR but is agreeable to SNF level rehab at d/c. MD and discharge planning team made aware.     Follow Up Recommendations  SNF;Supervision/Assistance - 24 hour     Equipment Recommendations  Rolling walker with 5" wheels    Recommendations for Other Services Rehab consult     Precautions / Restrictions Precautions Precautions: Fall Restrictions Weight Bearing Restrictions: No    Mobility  Bed Mobility Overal bed mobility: Needs Assistance Bed Mobility: Supine to Sit     Supine to sit: Supervision     General bed mobility comments: Pt in long sitting in bed and transitioned around to sit EOB. No assist required. From a flat surface and no rails pt likely would require assistance.   Transfers Overall  transfer level: Needs assistance Equipment used: Rolling walker (2 wheeled) Transfers: Sit to/from Stand Sit to Stand: Min guard         General transfer comment: VC's for hand placement on seated surface for safety. No overt LOB with sit>stand. VC's for safety before initiating stand>sit.   Ambulation/Gait Ambulation/Gait assistance: Min guard Gait Distance (Feet): 200 Feet Assistive device: Rolling walker (2 wheeled);Straight cane Gait Pattern/deviations: Step-through pattern;Decreased step length - right;Decreased stance time - right;Decreased stride length;Decreased dorsiflexion - right;Decreased dorsiflexion - left Gait velocity: decreased Gait velocity interpretation: <1.31 ft/sec, indicative of household ambulator General Gait Details: Initially with RW. Pt stating he prefers the cane so attempted with the cane as well. Pt appears steadier with the RW and with less antalgia. No overt LOB noted with RW or SPC.    Stairs Stairs: Yes Stairs assistance: Min guard Stair Management: One rail Right;Step to pattern Number of Stairs: 10 General stair comments: VC's for sequencing and general safety, leading up with the RLE and down with the LLE. Pt tolerated well with no complaints of increased pain or noticable DOE.    Wheelchair Mobility    Modified Rankin (Stroke Patients Only) Modified Rankin (Stroke Patients Only) Pre-Morbid Rankin Score: No symptoms Modified Rankin: Moderately severe disability     Balance Overall balance assessment: Needs assistance Sitting-balance support: Single extremity supported;Feet unsupported Sitting balance-Leahy Scale: Fair     Standing balance support: During functional activity;No upper extremity supported;Bilateral upper extremity supported Standing balance-Leahy Scale: Fair Standing balance comment: static stand without UE support, RW  for amb                            Cognition Arousal/Alertness: Awake/alert Behavior  During Therapy: WFL for tasks assessed/performed Overall Cognitive Status: Impaired/Different from baseline Area of Impairment: Memory;Safety/judgement                     Memory: Decreased short-term memory   Safety/Judgement: Decreased awareness of deficits;Decreased awareness of safety            Exercises      General Comments        Pertinent Vitals/Pain Pain Assessment: Faces Faces Pain Scale: Hurts little more Pain Location: LLE during MMT Pain Descriptors / Indicators: Heaviness Pain Intervention(s): Limited activity within patient's tolerance;Monitored during session;Repositioned    Home Living     Available Help at Discharge: Family Type of Home: Apartment              Prior Function            PT Goals (current goals can now be found in the care plan section) Acute Rehab PT Goals Patient Stated Goal: Rehab and then home - be able to take care of himself PT Goal Formulation: With patient Time For Goal Achievement: 09/17/20 Potential to Achieve Goals: Good Progress towards PT goals: Progressing toward goals    Frequency    Min 3X/week      PT Plan Discharge plan needs to be updated;Frequency needs to be updated    Co-evaluation              AM-PAC PT "6 Clicks" Mobility   Outcome Measure  Help needed turning from your back to your side while in a flat bed without using bedrails?: A Little Help needed moving from lying on your back to sitting on the side of a flat bed without using bedrails?: A Little Help needed moving to and from a bed to a chair (including a wheelchair)?: A Lot Help needed standing up from a chair using your arms (e.g., wheelchair or bedside chair)?: A Lot Help needed to walk in hospital room?: A Lot Help needed climbing 3-5 steps with a railing? : A Lot 6 Click Score: 14    End of Session Equipment Utilized During Treatment: Gait belt Activity Tolerance: Patient tolerated treatment well Patient  left: in chair;Other (comment) (OT present) Nurse Communication: Mobility status PT Visit Diagnosis: Unsteadiness on feet (R26.81);Pain;Hemiplegia and hemiparesis Hemiplegia - Right/Left: Left Hemiplegia - dominant/non-dominant: Non-dominant Hemiplegia - caused by: Other cerebrovascular disease Pain - Right/Left: Left Pain - part of body: Leg;Hand     Time: 1407-1500 PT Time Calculation (min) (ACUTE ONLY): 53 min  Charges:  $Gait Training: 23-37 mins $Therapeutic Activity: 23-37 mins                     Conni Slipper, PT, DPT Acute Rehabilitation Services Pager: [phone] Office: [phone]    Marylynn Pearson 09/04/2020, 3:15 PM

## 2020-09-04 NOTE — NC FL2 (Signed)
[place] MEDICAID FL2 LEVEL OF CARE SCREENING TOOL     IDENTIFICATION  Patient Name: Nicholas [name] Birthdate: May 02, 1961 Sex: male Admission Date (Current Location): 09/02/2020  [facility] and [place] Number:  Producer, television/film/video and Address:  [facility] [place], [address] [place], [place] [address]      Provider Number: [id number]  Attending Physician Name and Address:  Alberteen Sam, *  Relative Name and Phone Number:       Current Level of Care: Hospital Recommended Level of Care: Skilled Nursing Facility Prior Approval Number:    Date Approved/Denied:   PASRR Number: Manual Review  Discharge Plan: SNF    Current Diagnoses: Patient Active Problem List   Diagnosis Date Noted  . Precordial pain   . CVA (cerebral vascular accident) (HCC) 09/02/2020  . Altered mental status 07/23/2020  . Seizure (HCC) 07/22/2020  . Left-sided weakness 07/04/2020  . Dysarthria 07/04/2020  . Essential hypertension   . Seizure disorder (HCC) 07/02/2017  . Insulin-requiring or dependent type II diabetes mellitus (HCC) 07/02/2017  . CKD (chronic kidney disease), stage III 07/02/2017  . Normocytic anemia 07/02/2017  . Testicular/scrotal pain 11/09/2013  . Microhematuria 11/09/2013  . Condyloma acuminatum of scrotum 11/09/2013    Orientation RESPIRATION BLADDER Height & Weight     Self, Time, Situation, Place  Normal Continent Weight: 189 lb 9.5 oz (86 kg) Height:  5\' 7"  (170.2 cm)  BEHAVIORAL SYMPTOMS/MOOD NEUROLOGICAL BOWEL NUTRITION STATUS      Continent Diet (See DC Summary)  AMBULATORY STATUS COMMUNICATION OF NEEDS Skin   Limited Assist Verbally Normal                       Personal Care Assistance Level of Assistance  Bathing, Feeding, Dressing Bathing Assistance: Limited assistance Feeding assistance: Independent Dressing Assistance: Limited assistance     Functional Limitations Info             SPECIAL CARE FACTORS  FREQUENCY  PT (By licensed PT), OT (By licensed OT)     PT Frequency: 5xweek OT Frequency: 5xweek            Contractures Contractures Info: Not present    Additional Factors Info  Code Status, Allergies Code Status Info: Full Allergies Info: Levothyroxine, Nsaids, Amoxicillin, Ampicillin, Penicillins, Strawberry Extract, Asa (Aspirin), Bactrim (Sulfamethoxazole-trimethoprim), Depakote (Divalproex Sodium), Dilantin (Phenytoin), Methocarbamol, Risperidone And Related, Sitagliptin, Strawberry (Diagnostic), Sulfa Antibiotics, Tolmetin, Ultram (Tramadol Hcl), Oatmeal           Current Medications (09/04/2020):  This is the current hospital active medication list Current Facility-Administered Medications  Medication Dose Route Frequency Provider Last Rate Last Admin  .  stroke: mapping our early stages of recovery book   Does not apply Once 09/06/2020 T, MD      . acetaminophen (TYLENOL) tablet 650 mg  650 mg Oral Q4H PRN Mikey College, MD       Or  . acetaminophen (TYLENOL) 160 MG/5ML solution 650 mg  650 mg Per Tube Q4H PRN Emeline General T, MD       Or  . acetaminophen (TYLENOL) suppository 650 mg  650 mg Rectal Q4H PRN Mikey College T, MD      . acetaminophen (TYLENOL) tablet 650 mg  650 mg Oral Q8H Mikey College T, MD   650 mg at 09/03/20 2132  . albuterol (VENTOLIN HFA) 108 (90 Base) MCG/ACT inhaler 1-2 puff  1-2 puff Inhalation Q6H PRN 2133,  Renae Fickle, MD      . atorvastatin (LIPITOR) tablet 80 mg  80 mg Oral QHS Emeline General, MD   80 mg at 09/03/20 2132  . Chlorhexidine Gluconate Cloth 2 % PADS 6 each  6 each Topical Daily Regalado, Belkys A, MD   6 each at 09/04/20 1230  . clopidogrel (PLAVIX) tablet 75 mg  75 mg Oral Daily Mikey College T, MD   75 mg at 09/04/20 1229  . fenofibrate tablet 160 mg  160 mg Oral Daily Mikey College T, MD   160 mg at 09/04/20 1229  . fluticasone furoate-vilanterol (BREO ELLIPTA) 100-25 MCG/INH 1 puff  1 puff Inhalation Daily Mikey College T, MD      .  gabapentin (NEURONTIN) capsule 600 mg  600 mg Oral BID Mikey College T, MD   600 mg at 09/04/20 1228  . heparin injection 5,000 Units  5,000 Units Subcutaneous Q12H Mikey College T, MD   5,000 Units at 09/04/20 1229  . hydrALAZINE (APRESOLINE) tablet 25 mg  25 mg Oral Q6H PRN Mikey College T, MD      . HYDROcodone-acetaminophen [facility]) 10-325 MG per tablet 1 tablet  1 tablet Oral Q6H PRN Regalado, Belkys A, MD   1 tablet at 09/04/20 1236  . insulin aspart (novoLOG) injection 0-15 Units  0-15 Units Subcutaneous TID WC Emeline General, MD   3 Units at 09/04/20 1251  . latanoprost (XALATAN) 0.005 % ophthalmic solution 1 drop  1 drop Both Eyes QHS Emeline General, MD   1 drop at 09/03/20 2220  . levETIRAcetam (KEPPRA) tablet 500 mg  500 mg Oral BID Mikey College T, MD   500 mg at 09/04/20 1229  . metoprolol tartrate (LOPRESSOR) tablet 25 mg  25 mg Oral BID Mikey College T, MD   25 mg at 09/04/20 1228  . nitroGLYCERIN (NITROSTAT) SL tablet 0.4 mg  0.4 mg Sublingual Q5 min PRN Regalado, Belkys A, MD      . nortriptyline (PAMELOR) capsule 100 mg  100 mg Oral QHS Mikey College T, MD   100 mg at 09/03/20 2130  . pantoprazole (PROTONIX) EC tablet 40 mg  40 mg Oral BID Mikey College T, MD   40 mg at 09/04/20 1228  . polyethylene glycol (MIRALAX / GLYCOLAX) packet 17 g  17 g Oral Daily PRN Regalado, Belkys A, MD      . senna-docusate (Senokot-S) tablet 1 tablet  1 tablet Oral QHS PRN Mikey College T, MD      . sertraline (ZOLOFT) tablet 50 mg  50 mg Oral Daily Mikey College T, MD   50 mg at 09/04/20 1229  . SUMAtriptan (IMITREX) tablet 50 mg  50 mg Oral Q2H PRN Mikey College T, MD      . tamsulosin [facility]) capsule 0.4 mg  0.4 mg Oral QPC breakfast Alberteen Sam, MD   0.4 mg at 09/04/20 1236  . timolol (TIMOPTIC) 0.5 % ophthalmic solution 1 drop  1 drop Both Eyes BID Mikey College T, MD   1 drop at 09/04/20 1232  . [START ON 09/09/2020] Vitamin D (Ergocalciferol) (DRISDOL) capsule 50,000 Units  50,000 Units Oral Q Mon Zhang,  Ping T, MD      . zonisamide [facility]) capsule 200 mg  200 mg Oral BID Mikey College T, MD   200 mg at 09/04/20 1228     Discharge Medications: Please see discharge summary for a list of discharge medications.  Relevant Imaging Results:  Relevant Lab  Results:   Additional Information SS#: [id number]  Baldemar Lenis, LCSW

## 2020-09-05 DIAGNOSIS — G43109 Migraine with aura, not intractable, without status migrainosus: Principal | ICD-10-CM

## 2020-09-05 LAB — GLUCOSE, CAPILLARY
Glucose-Capillary: 157 mg/dL — ABNORMAL HIGH (ref 70–99)
Glucose-Capillary: 168 mg/dL — ABNORMAL HIGH (ref 70–99)
Glucose-Capillary: 132 mg/dL — ABNORMAL HIGH (ref 70–99)

## 2020-09-05 LAB — RESPIRATORY PANEL BY RT PCR (FLU A&B, COVID)
Influenza A by PCR: NEGATIVE
Influenza B by PCR: NEGATIVE
SARS Coronavirus 2 by RT PCR: NEGATIVE

## 2020-09-05 MED ORDER — ATORVASTATIN CALCIUM 80 MG PO TABS: 80.0000 mg | ORAL_TABLET | Freq: Every day | ORAL | 3 refills | Status: AC

## 2020-09-05 MED ORDER — LEVETIRACETAM 500 MG PO TABS: 500.0000 mg | ORAL_TABLET | Freq: Two times a day (BID) | ORAL | 3 refills | Status: DC

## 2020-09-05 NOTE — Discharge Summary (Addendum)
Physician Discharge Summary  Nicholas [name] OAC:[id number] DOB: 02-Jan-1961 DOA: 09/02/2020  PCP: Patient, No Pcp Per  Admit date: 09/02/2020 Discharge date: 09/05/2020  Admitted From: Home  Disposition:  SNF   Recommendations for Outpatient Follow-up:  1. Follow up with PCP in 1-2 weeks after discharge from skilled nursing 2. Follow up with cardiology Dr. Rosemary Holms in 4 weeks 3. Follow up with primary neurologist/Headache specialist in 4-6 weeks      Home Health: N/A  Equipment/Devices: TBD at SNF  Discharge Condition: Fair  CODE STATUS: FULL Diet recommendation: Cardiac  Brief/Interim Summary: Nicholas [name] is a 59 y.o. 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  In the ER CT head and CTA head and neck were unremarkable.  Neurology were consulted, who suspected complex migraine.  Follow up MRI brain confirmed no stroke.        PRINCIPAL HOSPITAL DIAGNOSIS: Complex migraine    Discharge Diagnoses:   Complex migraine causing hemiparesis Patient was admitted, given Compazine and Benadryl IV, and IV fluids.  His headache improved.  An MRI brain was unremarkable.  Stroke was ruled out.  PT evaluated the patient, found him to have persistent generalized weakness, and recommended skilled nursing rehab.  He has a good rehab potential, and should follow up with his headache specialist as an outpatient.    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  Incidentally noted to have EF 45%.  Ischemic functional testing was low risk.  Beta-blockade and ARB prevented by slow heart rate/bradycardia and hypotension.  Cardiology were consulted.  He should follow up with cardiology in 2 to 4 weeks.  Cerebrovascular disease, secondary prevention Hypertension Continue Plavix,  fibrate, atorvastatin  Urinary retention Resolved before discharge  Diabetes  History of seizures No seizures here.  EEG unremarkable.  Continue Keppra, gabapentin, zonisamide   GERD Continue PPI  Glaucoma Continue eyedrops  Depression Continue sertraline    OSA Patient uses CPAP nightly with 10 cmH2O.  If there is a delay in procuring CPAP, it is medically reasonable to defer use for several days to weeks.          Discharge Instructions  Discharge Instructions    Diet - low sodium heart healthy   Complete by: As directed    Discharge instructions   Complete by: As directed    From Dr. Maryfrances Bunnell: You were admitted for confusion and weakness.   We believe from our testing that this was from a complex migraine Thankfully, these resolve completely without long term problems You should call your primary neurologist at [facility] for an appointment.  Your last appointment was with Dr. Alton Revere, whose information is listed below in the To Do section   We did notice quite by accident that you have a weak heart squeeze however.  You should continue your atorvastatin (cholesterol medicine) and Valsartan (blood pressure medicine) without change  Continue your diabetes medicines wtihout change.  I have sent refills of your atorvastatin and Keppra to your pharmacy  Go see the heart specialist, Dr. Rosemary Holms, in 2-4 weeks Call his office (number listed below in To Do section) for an appiontment   Increase activity slowly   Complete by: As directed      Allergies as of 09/05/2020      Reactions   Levothyroxine Anaphylaxis, Cough   Chronic cough  Nsaids Other (See Comments)   D/t gastric ulcer   Amoxicillin Itching, Other (See Comments)   THRUSH Causes sores in mouth   Ampicillin Other (See Comments)   THRUSH   Penicillins Itching, Other (See Comments)   THRUSH Causes sores in mouth   Strawberry Extract Swelling   LIPS SWELL   Asa [aspirin] Other  (See Comments)   Bleeding   Bactrim [sulfamethoxazole-trimethoprim] Hives, Itching, Other (See Comments)   GI upset   Depakote [divalproex Sodium]    Causes double vision and speech problems    Dilantin [phenytoin] Other (See Comments)   Severe skin peeling   Methocarbamol Other (See Comments)   dizziness   Risperidone And Related Other (See Comments)   Hallucinations   Sitagliptin Other (See Comments)   pancreatitis   Strawberry (diagnostic) Itching, Swelling   Sulfa Antibiotics Other (See Comments)   Affects thyroid   Tolmetin Other (See Comments)   D/t gastric ulcer   Ultram [tramadol Hcl] Other (See Comments)   D/t gastric ulcer   Oatmeal Hives, Other (See Comments)   White spots/sores in mouth      Medication List    STOP taking these medications   HYDROcodone-acetaminophen 10-325 MG tablet Commonly known as: NORCO     TAKE these medications   acetaminophen 650 MG CR tablet Commonly known as: Tylenol 8 Hour Take 1 tablet (650 mg total) by mouth every 8 (eight) hours.   albuterol 108 (90 Base) MCG/ACT inhaler Commonly known as: VENTOLIN HFA Inhale 1-2 puffs into the lungs every 6 (six) hours as needed for wheezing or shortness of breath.   atorvastatin 80 MG tablet Commonly known as: LIPITOR Take 1 tablet (80 mg total) by mouth at bedtime.   budesonide-formoterol 80-4.5 MCG/ACT inhaler Commonly known as: SYMBICORT Inhale 2 puffs into the lungs daily.   clopidogrel 75 MG tablet Commonly known as: PLAVIX Take 1 tablet (75 mg total) by mouth daily.   ergocalciferol 1.25 MG (50000 UT) capsule Commonly known as: VITAMIN D2 Take 50,000 Units by mouth every Monday.   Farxiga 10 MG Tabs tablet Generic drug: dapagliflozin propanediol Take 10 mg by mouth daily.   fenofibrate 145 MG tablet Commonly known as: TRICOR Take 145 mg by mouth daily.   fluticasone-salmeterol 115-21 MCG/ACT inhaler Commonly known as: ADVAIR HFA Inhale 2 puffs into the lungs daily.    gabapentin 300 MG capsule Commonly known as: NEURONTIN Take 900 mg by mouth at bedtime.   HumaLOG KwikPen 200 UNIT/ML KwikPen Generic drug: insulin lispro Inject 0-10 Units into the skin 3 (three) times daily with meals.   latanoprost 0.005 % ophthalmic solution Commonly known as: XALATAN Place 1 drop into both eyes at bedtime.   levETIRAcetam 500 MG tablet Commonly known as: KEPPRA Take 1 tablet (500 mg total) by mouth 2 (two) times daily. 750 mg twice daily for a month, then on 08/25/20 take 500 mg twice daily for a month then 09/24/20 take 250 mg twice daily for a month then stop What changed:   medication strength  how much to take   metFORMIN 1000 MG tablet Commonly known as: GLUCOPHAGE Take 1 tablet (1,000 mg total) by mouth 2 (two) times daily.   nortriptyline 50 MG capsule Commonly known as: PAMELOR Take 100 mg by mouth at bedtime.   pantoprazole 40 MG tablet Commonly known as: PROTONIX Take 40 mg by mouth 2 (two) times daily.   polyethylene glycol powder 17 GM/SCOOP powder Commonly known as: GLYCOLAX/MIRALAX Take 17 g by  mouth daily as needed for mild constipation.   rizatriptan 10 MG tablet Commonly known as: MAXALT Take 10 mg by mouth daily. May repeat in 2 hours if needed   sertraline 50 MG tablet Commonly known as: ZOLOFT Take 50 mg by mouth daily.   SUMAtriptan 100 MG tablet Commonly known as: IMITREX Take 1 tablet (100 mg total) by mouth daily as needed for migraine or headache. May repeat in 2 hours if headache persists or recurs.   timolol 0.5 % ophthalmic solution Commonly known as: TIMOPTIC Place 1 drop into both eyes 2 (two) times daily.   valsartan 320 MG tablet Commonly known as: DIOVAN Take 320 mg by mouth daily.   zonisamide 100 MG capsule Commonly known as: ZONEGRAN Take 200 mg by mouth 2 (two) times daily.       Follow-up Information    Patwardhan, Anabel Bene, MD. Schedule an appointment as soon as possible for a visit in 1  week(s).   Specialties: Cardiology, Radiology Contact information: [address] [place] [address] [phone]        Iven Finn., MD. Schedule an appointment as soon as possible for a visit in 1 month(s).   Specialty: Neurology Contact information: [address] [place] [address]              Allergies  Allergen Reactions  . Levothyroxine Anaphylaxis and Cough    Chronic cough  . Nsaids Other (See Comments)    D/t gastric ulcer  . Amoxicillin Itching and Other (See Comments)    THRUSH Causes sores in mouth  . Ampicillin Other (See Comments)    THRUSH  . Penicillins Itching and Other (See Comments)    THRUSH Causes sores in mouth  . Strawberry Extract Swelling    LIPS SWELL  . Asa [Aspirin] Other (See Comments)    Bleeding   . Bactrim [Sulfamethoxazole-Trimethoprim] Hives, Itching and Other (See Comments)    GI upset  . Depakote [Divalproex Sodium]     Causes double vision and speech problems   . Dilantin [Phenytoin] Other (See Comments)    Severe skin peeling  . Methocarbamol Other (See Comments)    dizziness  . Risperidone And Related Other (See Comments)    Hallucinations   . Sitagliptin Other (See Comments)    pancreatitis  . Strawberry (Diagnostic) Itching and Swelling  . Sulfa Antibiotics Other (See Comments)    Affects thyroid   . Tolmetin Other (See Comments)    D/t gastric ulcer  . Ultram [Tramadol Hcl] Other (See Comments)    D/t gastric ulcer  . Oatmeal Hives and Other (See Comments)    White spots/sores in mouth    Consultations:  Neurology  Cardiology   Procedures/Studies: MR BRAIN WO CONTRAST  Result Date: 09/02/2020 CLINICAL DATA:  Acute neuro deficit.  Rule out stroke. EXAM: MRI HEAD WITHOUT CONTRAST TECHNIQUE: Multiplanar, multiecho pulse sequences of the brain and surrounding structures were obtained without intravenous contrast. COMPARISON:  CT angio head and neck  09/02/2020 FINDINGS: Brain: No acute infarction, hemorrhage, hydrocephalus, extra-axial collection or mass lesion. Vascular: Normal arterial flow voids. Skull and upper cervical spine: Negative Sinuses/Orbits: Paranasal sinuses clear.  Negative orbit Other: None IMPRESSION: Negative MRI head Electronically Signed   By: Marlan Palau M.D.   On: 09/02/2020 18:00   NM Myocar Multi W/Spect W/Wall Motion / EF  Result Date: 09/04/2020 CLINICAL DATA:  Chest pain.  59 year old male. EXAM: MYOCARDIAL IMAGING WITH SPECT (  REST AND PHARMACOLOGIC-STRESS) GATED LEFT VENTRICULAR WALL MOTION STUDY LEFT VENTRICULAR EJECTION FRACTION TECHNIQUE: Standard myocardial SPECT imaging was performed after resting intravenous injection of 10 mCi Tc-58m tetrofosmin. Subsequently, intravenous infusion of Lexiscan was performed under the supervision of the Cardiology staff. At peak effect of the drug, 30 mCi Tc-66m tetrofosmin was injected intravenously and standard myocardial SPECT imaging was performed. Quantitative gated imaging was also performed to evaluate left ventricular wall motion, and estimate left ventricular ejection fraction. COMPARISON:  None. FINDINGS: Perfusion: No decreased activity in the left ventricle on stress imaging to suggest reversible ischemia or infarction. Wall Motion: Normal left ventricular wall motion. No left ventricular dilation. Left Ventricular Ejection Fraction: 46 % End diastolic volume 96 ml End systolic volume 51 ml IMPRESSION: 1. No reversible ischemia or infarction. 2. Normal left ventricular wall motion. 3. Left ventricular ejection fraction 46% 4. Non invasive risk stratification*: Low *2012 Appropriate Use Criteria for Coronary Revascularization Focused Update: J Am Coll Cardiol. 2012;59(9):857-881. http://content.dementiazones.com.aspx?articleid=1201161 Electronically Signed   By: Genevive Bi M.D.   On: 09/04/2020 12:52   DG Chest Portable 1 View  Result Date: 09/02/2020 CLINICAL  DATA:  Left-sided chest pain EXAM: PORTABLE CHEST 1 VIEW COMPARISON:  08/14/2020 FINDINGS: The heart size and mediastinal contours are within normal limits. Both lungs are clear. The visualized skeletal structures are unremarkable. IMPRESSION: No active disease. Electronically Signed   By: Jasmine Pang M.D.   On: 09/02/2020 15:23   DG Chest Portable 1 View  Result Date: 08/14/2020 CLINICAL DATA:  Chest pain, cough, lower back pain for 2 weeks EXAM: PORTABLE CHEST 1 VIEW COMPARISON:  04/07/2020 FINDINGS: The heart size and mediastinal contours are within normal limits. Both lungs are clear. The visualized skeletal structures are unremarkable. IMPRESSION: No active disease. Electronically Signed   By: Sharlet Salina M.D.   On: 08/14/2020 21:57   EEG adult  Result Date: 09/04/2020 Charlsie Quest, MD     09/04/2020  8:50 AM Patient Name: Nicholas [name] MRN: [id number] Epilepsy Attending: Charlsie Quest Referring Physician/Provider: Dr. Hartley Barefoot Date: 09/03/2020 Duration: 27.34 mins Patient history: 59 year old male with history of nonepileptic events presented with left-sided weakness and speech disturbance.  EEG to evaluate for seizure. Level of alertness: Awake AEDs during EEG study: Keppra, gabapentin, zonisamide Technical aspects: This EEG study was done with scalp electrodes positioned according to the 10-20 International system of electrode placement. Electrical activity was acquired at a sampling rate of 500Hz  and reviewed with a high frequency filter of 70Hz  and a low frequency filter of 1Hz . EEG data were recorded continuously and digitally stored. Description: The posterior dominant rhythm consists of 8-9 Hz activity of moderate voltage (25-35 uV) seen predominantly in posterior head regions, symmetric and reactive to eye opening and eye closing.  Hyperventilation and photic stimulation were not performed.   IMPRESSION: This study is within normal limits. No seizures or epileptiform  discharges were seen throughout the recording. Charlsie Quest   ECHOCARDIOGRAM COMPLETE  Result Date: 09/03/2020    ECHOCARDIOGRAM REPORT   Patient Name:   Nicholas [name] Date of Exam: 09/03/2020 Medical Rec #:  [id number]      Height:       67.0 in Accession #:    [id number]     Weight:       189.6 lb Date of Birth:  1961/04/29     BSA:          1.977 m Patient Age:    63 years  BP:           121/85 mmHg Patient Gender: M              HR:           65 bpm. Exam Location:  Inpatient Procedure: 2D Echo, Cardiac Doppler and Color Doppler Indications:    TIA 435.9 / G45.9  History:        Patient has prior history of Echocardiogram examinations, most                 recent 07/05/2017. Risk Factors:Diabetes and Hypertension.                 Epilepsy. Seizures. GERD. Hyperthyroidism.  Sonographer:    Elmarie Shiley Dance Referring Phys: [id number] Emeline General IMPRESSIONS  1. Left ventricular ejection fraction, by estimation, is 45 to 50%. The left ventricle has mildly decreased function. The left ventricle demonstrates regional wall motion abnormalities (see scoring diagram/findings for description). There is mild left ventricular hypertrophy. Left ventricular diastolic parameters are consistent with Grade I diastolic dysfunction (impaired relaxation). There is moderate hypokinesis of the left ventricular, basal-mid inferolateral wall and inferior wall suggesting possible circumflex territory ischemia  2. Right ventricular systolic function is mildly reduced. The right ventricular size is normal. There is normal pulmonary artery systolic pressure. The estimated right ventricular systolic pressure is 23.2 mmHg.  3. The mitral valve is abnormal. Trivial mitral valve regurgitation.  4. The aortic valve is tricuspid. Aortic valve regurgitation is not visualized.  5. The inferior vena cava is normal in size with greater than 50% respiratory variability, suggesting right atrial pressure of 3 mmHg. Comparison(s): Changes from  prior study are noted. 07/05/2017: LVEF 60-65%. FINDINGS  Left Ventricle: Left ventricular ejection fraction, by estimation, is 45 to 50%. The left ventricle has mildly decreased function. The left ventricle demonstrates regional wall motion abnormalities. Moderate hypokinesis of the left ventricular, basal-mid inferolateral wall and inferior wall. The left ventricular internal cavity size was normal in size. There is mild left ventricular hypertrophy. Left ventricular diastolic parameters are consistent with Grade I diastolic dysfunction (impaired relaxation). Indeterminate filling pressures. Right Ventricle: The right ventricular size is normal. No increase in right ventricular wall thickness. Right ventricular systolic function is mildly reduced. There is normal pulmonary artery systolic pressure. The tricuspid regurgitant velocity is 2.25 m/s, and with an assumed right atrial pressure of 3 mmHg, the estimated right ventricular systolic pressure is 23.2 mmHg. Left Atrium: Left atrial size was normal in size. Right Atrium: Right atrial size was normal in size. Pericardium: There is no evidence of pericardial effusion. Mitral Valve: The mitral valve is abnormal. There is moderate thickening of the mitral valve leaflet(s). Trivial mitral valve regurgitation. Tricuspid Valve: The tricuspid valve is grossly normal. Tricuspid valve regurgitation is trivial. Aortic Valve: The aortic valve is tricuspid. Aortic valve regurgitation is not visualized. Pulmonic Valve: The pulmonic valve was grossly normal. Pulmonic valve regurgitation is trivial. Aorta: The aortic root and ascending aorta are structurally normal, with no evidence of dilitation. Venous: The inferior vena cava is normal in size with greater than 50% respiratory variability, suggesting right atrial pressure of 3 mmHg. IAS/Shunts: No atrial level shunt detected by color flow Doppler.  LEFT VENTRICLE PLAX 2D LVIDd:         4.70 cm  Diastology LVIDs:         2.80  cm  LV e' medial:    6.42 cm/s LV PW:  1.20 cm  LV E/e' medial:  9.2 LV IVS:        1.20 cm  LV e' lateral:   8.49 cm/s LVOT diam:     2.30 cm  LV E/e' lateral: 7.0 LV SV:         64 LV SV Index:   32 LVOT Area:     4.15 cm  RIGHT VENTRICLE            IVC RV Basal diam:  3.00 cm    IVC diam: 1.80 cm RV S prime:     7.35 cm/s TAPSE (M-mode): 1.8 cm LEFT ATRIUM             Index       RIGHT ATRIUM           Index LA diam:        3.80 cm 1.92 cm/m  RA Area:     18.00 cm LA Vol (A2C):   49.7 ml 25.14 ml/m RA Volume:   47.30 ml  23.93 ml/m LA Vol (A4C):   45.4 ml 22.97 ml/m LA Biplane Vol: 49.6 ml 25.09 ml/m  AORTIC VALVE LVOT Vmax:   69.80 cm/s LVOT Vmean:  50.400 cm/s LVOT VTI:    0.154 m  AORTA Ao Root diam: 3.70 cm Ao Asc diam:  3.60 cm MITRAL VALVE               TRICUSPID VALVE MV Area (PHT): 2.95 cm    TR Peak grad:   20.2 mmHg MV Decel Time: 257 msec    TR Vmax:        225.00 cm/s MV E velocity: 59.10 cm/s MV A velocity: 59.10 cm/s  SHUNTS MV E/A ratio:  1.00        Systemic VTI:  0.15 m                            Systemic Diam: 2.30 cm Zoila Shutter MD Electronically signed by Zoila Shutter MD Signature Date/Time: 09/03/2020/11:45:14 AM    Final    CT HEAD CODE STROKE WO CONTRAST  Result Date: 09/02/2020 CLINICAL DATA:  Code stroke. 59 year old male with left side weakness. EXAM: CT HEAD WITHOUT CONTRAST TECHNIQUE: Contiguous axial images were obtained from the base of the skull through the vertex without intravenous contrast. COMPARISON:  Brain MRI 07/22/2020 and earlier. FINDINGS: Brain: Gray-white matter differentiation appears stable and within normal limits. Asymmetric dystrophic calcification of the left basal ganglia again noted. No midline shift, ventriculomegaly, mass effect, evidence of mass lesion, intracranial hemorrhage or evidence of cortically based acute infarction. Partially empty sella again noted. Vascular: No suspicious intracranial vascular hyperdensity identified. Skull: No  acute osseous abnormality identified. Sinuses/Orbits: Visualized paranasal sinuses and mastoids are clear. Other: No acute orbit or scalp soft tissue finding identified. ASPECTS [facility] Stroke Program Early CT Score) Total score (0-10 with 10 being normal): 10 IMPRESSION: 1. Stable and normal noncontrast CT appearance of the brain. ASPECTS 10. 2. These results were communicated to Dr. Darnelle Maffucci At 2:07 pm on 09/02/2020 by text page via the [facility] messaging system. Electronically Signed   By: Odessa Fleming M.D.   On: 09/02/2020 14:08   CT ANGIO HEAD CODE STROKE  Addendum Date: 09/02/2020   ADDENDUM REPORT: 09/02/2020 14:56 ADDENDUM: These results were called by telephone at the time of interpretation on 09/02/2020 at 2:56 pm to provider Dr. Criss Alvine, who verbally acknowledged these results. Electronically Signed  By: Jackey Loge DO   On: 09/02/2020 14:56   Result Date: 09/02/2020 CLINICAL DATA:  Suspected stroke. Last known well 12 p.m., left arm and leg weakness. EXAM: CT ANGIOGRAPHY HEAD AND NECK TECHNIQUE: Multidetector CT imaging of the head and neck was performed using the standard protocol during bolus administration of intravenous contrast. Multiplanar CT image reconstructions and MIPs were obtained to evaluate the vascular anatomy. Carotid stenosis measurements (when applicable) are obtained utilizing NASCET criteria, using the distal internal carotid diameter as the denominator. CONTRAST:  OMNIPAQUE IOHEXOL 350 MG/ML SOLN COMPARISON:  Noncontrast head CT performed earlier the same day 09/02/2020. Brain MRI 07/22/2020. CT angiogram head/neck 07/21/2020. FINDINGS: CTA NECK FINDINGS Aortic arch: Standard aortic branching. Minimal calcified plaque within the distal innominate/proximal right subclavian artery. No hemodynamically significant innominate or proximal subclavian artery stenosis. Right carotid system: CCA and ICA patent within the neck without stenosis. No significant atherosclerotic disease.  Left carotid system: CCA and ICA patent within the neck without stenosis. No significant atherosclerotic disease. Vertebral arteries: The right vertebral artery is dominant. Portions of the V2 left vertebral artery are obscured by streak artifact arising from spinal fusion hardware. Within this limitation, the vertebral arteries are patent within the neck bilaterally without significant stenosis. Skeleton: No acute bony abnormality or aggressive osseous lesion. Sequela of prior fusion at the C4-C7 levels. Other neck: No neck mass or cervical lymphadenopathy. Upper chest: No consolidation within the imaged lung apices. Review of the MIP images confirms the above findings CTA HEAD FINDINGS Anterior circulation: The intracranial internal carotid arteries are patent. The M1 middle cerebral arteries are patent without significant stenosis. No M2 proximal branch occlusion is identified. Atherosclerotic irregularity of M2 and more distal MCA branch vessels bilaterally. Most notably, there has been apparent interval progression of a now high-grade focal stenosis within a proximal M2 right MCA branch vessel (series 9, image 15). The anterior cerebral arteries are patent. No intracranial aneurysm is identified. Posterior circulation: The intracranial vertebral arteries are patent. The basilar artery is patent. The posterior cerebral arteries are patent. Apparent moderate/severe stenosis within the right posterior cerebral artery at the P3/P4 junction, not appreciated on the prior examination of 07/21/2020. (Series 10, image 56) (series 9, image 20). A right posterior communicating artery is present. A small left posterior communicating artery is likely present. Venous sinuses: Within limitations of contrast timing, no convincing thrombus. Anatomic variants: As described. Review of the MIP images confirms the above findings IMPRESSION: CTA neck: Streak artifact arising from cervical spinal fusion hardware partially obscures  portions of the V2 left vertebral artery. Within this limitation, the common carotid, internal carotid and vertebral arteries are patent within the neck without evidence of hemodynamically significant stenosis. CTA head: 1. No intracranial large vessel occlusion. 2. Atherosclerotic irregularity of M2 and more distal MCA branch vessels bilaterally. Most notably, there is a progressive high-grade focal stenosis within a proximal M2 right MCA branch vessel. 3. Apparent moderate/severe focal stenosis within the right posterior cerebral artery at the P3/P4 junction, which appears new. Electronically Signed: By: Jackey Loge DO On: 09/02/2020 14:47   CT ANGIO NECK CODE STROKE  Addendum Date: 09/02/2020   ADDENDUM REPORT: 09/02/2020 14:56 ADDENDUM: These results were called by telephone at the time of interpretation on 09/02/2020 at 2:56 pm to provider Dr. Criss Alvine, who verbally acknowledged these results. Electronically Signed   By: Jackey Loge DO   On: 09/02/2020 14:56   Result Date: 09/02/2020 CLINICAL DATA:  Suspected stroke. Last known  well 12 p.m., left arm and leg weakness. EXAM: CT ANGIOGRAPHY HEAD AND NECK TECHNIQUE: Multidetector CT imaging of the head and neck was performed using the standard protocol during bolus administration of intravenous contrast. Multiplanar CT image reconstructions and MIPs were obtained to evaluate the vascular anatomy. Carotid stenosis measurements (when applicable) are obtained utilizing NASCET criteria, using the distal internal carotid diameter as the denominator. CONTRAST:  OMNIPAQUE IOHEXOL 350 MG/ML SOLN COMPARISON:  Noncontrast head CT performed earlier the same day 09/02/2020. Brain MRI 07/22/2020. CT angiogram head/neck 07/21/2020. FINDINGS: CTA NECK FINDINGS Aortic arch: Standard aortic branching. Minimal calcified plaque within the distal innominate/proximal right subclavian artery. No hemodynamically significant innominate or proximal subclavian artery stenosis.  Right carotid system: CCA and ICA patent within the neck without stenosis. No significant atherosclerotic disease. Left carotid system: CCA and ICA patent within the neck without stenosis. No significant atherosclerotic disease. Vertebral arteries: The right vertebral artery is dominant. Portions of the V2 left vertebral artery are obscured by streak artifact arising from spinal fusion hardware. Within this limitation, the vertebral arteries are patent within the neck bilaterally without significant stenosis. Skeleton: No acute bony abnormality or aggressive osseous lesion. Sequela of prior fusion at the C4-C7 levels. Other neck: No neck mass or cervical lymphadenopathy. Upper chest: No consolidation within the imaged lung apices. Review of the MIP images confirms the above findings CTA HEAD FINDINGS Anterior circulation: The intracranial internal carotid arteries are patent. The M1 middle cerebral arteries are patent without significant stenosis. No M2 proximal branch occlusion is identified. Atherosclerotic irregularity of M2 and more distal MCA branch vessels bilaterally. Most notably, there has been apparent interval progression of a now high-grade focal stenosis within a proximal M2 right MCA branch vessel (series 9, image 15). The anterior cerebral arteries are patent. No intracranial aneurysm is identified. Posterior circulation: The intracranial vertebral arteries are patent. The basilar artery is patent. The posterior cerebral arteries are patent. Apparent moderate/severe stenosis within the right posterior cerebral artery at the P3/P4 junction, not appreciated on the prior examination of 07/21/2020. (Series 10, image 56) (series 9, image 20). A right posterior communicating artery is present. A small left posterior communicating artery is likely present. Venous sinuses: Within limitations of contrast timing, no convincing thrombus. Anatomic variants: As described. Review of the MIP images confirms the  above findings IMPRESSION: CTA neck: Streak artifact arising from cervical spinal fusion hardware partially obscures portions of the V2 left vertebral artery. Within this limitation, the common carotid, internal carotid and vertebral arteries are patent within the neck without evidence of hemodynamically significant stenosis. CTA head: 1. No intracranial large vessel occlusion. 2. Atherosclerotic irregularity of M2 and more distal MCA branch vessels bilaterally. Most notably, there is a progressive high-grade focal stenosis within a proximal M2 right MCA branch vessel. 3. Apparent moderate/severe focal stenosis within the right posterior cerebral artery at the P3/P4 junction, which appears new. Electronically Signed: By: Jackey Loge DO On: 09/02/2020 14:47       Subjective: Patient has no fever overnight, no new headache, no focal weakness, speech changes, or vision changes.He has nonanatomic numbness in right hand today, not consistent on exam with central deficit, nor nerve impingement.  Discharge Exam: Vitals:   09/05/20 0323 09/05/20 0734  BP: 122/62 107/67  Pulse: 66 67  Resp: 18 18  Temp: 98 F (36.7 C) 98.5 F (36.9 C)  SpO2: 98% 97%   Vitals:   09/04/20 2320 09/04/20 2345 09/05/20 0323 09/05/20 0734  BP:  123/75  122/62 107/67  Pulse: 63 64 66 67  Resp: 18 16 18 18   Temp: 97.8 F (36.6 C)  98 F (36.7 C) 98.5 F (36.9 C)  TempSrc: Oral  Oral Oral  SpO2: 99% 98% 98% 97%  Weight:      Height:        General: Pt is alert, awake, not in acute distress Cardiovascular: RRR, nl S1-S2, no murmurs appreciated.   No LE edema.   Respiratory: Normal respiratory rate and rhythm.  CTAB without rales or wheezes. Abdominal: Abdomen soft and non-tender.  No distension or HSM.   Neuro/Psych: Strength symmetric in upper and lower extremities.  Judgment and insight appear normal.   The results of significant diagnostics from this hospitalization (including imaging, microbiology,  ancillary and laboratory) are listed below for reference.     Microbiology: Recent Results (from the past 240 hour(s))  Respiratory Panel by RT PCR (Flu A&B, Covid) - Nasopharyngeal Swab     Status: None   Collection Time: 09/02/20  2:36 PM   Specimen: Nasopharyngeal Swab  Result Value Ref Range Status   SARS Coronavirus 2 by RT PCR NEGATIVE NEGATIVE Final    Comment: (NOTE) SARS-CoV-2 target nucleic acids are NOT DETECTED.  The SARS-CoV-2 RNA is generally detectable in upper respiratoy specimens during the acute phase of infection. The lowest concentration of SARS-CoV-2 viral copies this assay can detect is 131 copies/mL. A negative result does not preclude SARS-Cov-2 infection and should not be used as the sole basis for treatment or other patient management decisions. A negative result may occur with  improper specimen collection/handling, submission of specimen other than nasopharyngeal swab, presence of viral mutation(s) within the areas targeted by this assay, and inadequate number of viral copies (<131 copies/mL). A negative result must be combined with clinical observations, patient history, and epidemiological information. The expected result is Negative.  Fact Sheet for Patients:  09/04/20  Fact Sheet for Healthcare Providers:  https://www.moore.com/  This test is no t yet approved or cleared by the https://www.young.biz/ FDA and  has been authorized for detection and/or diagnosis of SARS-CoV-2 by FDA under an Emergency Use Authorization (EUA). This EUA will remain  in effect (meaning this test can be used) for the duration of the COVID-19 declaration under Section 564(b)(1) of the Act, 21 U.S.C. section 360bbb-3(b)(1), unless the authorization is terminated or revoked sooner.     Influenza A by PCR NEGATIVE NEGATIVE Final   Influenza B by PCR NEGATIVE NEGATIVE Final    Comment: (NOTE) The Xpert Xpress SARS-CoV-2/FLU/RSV  assay is intended as an aid in  the diagnosis of influenza from Nasopharyngeal swab specimens and  should not be used as a sole basis for treatment. Nasal washings and  aspirates are unacceptable for Xpert Xpress SARS-CoV-2/FLU/RSV  testing.  Fact Sheet for Patients: [place]  Fact Sheet for Healthcare Providers: https://www.moore.com/  This test is not yet approved or cleared by the https://www.young.biz/ FDA and  has been authorized for detection and/or diagnosis of SARS-CoV-2 by  FDA under an Emergency Use Authorization (EUA). This EUA will remain  in effect (meaning this test can be used) for the duration of the  Covid-19 declaration under Section 564(b)(1) of the Act, 21  U.S.C. section 360bbb-3(b)(1), unless the authorization is  terminated or revoked. Performed at [facility], [address], [place], [place]   Urine Culture     Status: None   Collection Time: 09/03/20  3:38 PM  Specimen: Urine, Random  Result Value Ref Range Status   Specimen Description URINE, RANDOM  Final   Special Requests NONE  Final   Culture   Final    NO GROWTH Performed at [facility], [address], [place], [place] [address]    Report Status 09/04/2020 FINAL  Final     Labs: BNP (last 3 results) No results for input(s): BNP in the last 8760 hours. Basic Metabolic Panel: Recent Labs  Lab 09/02/20 1355 09/02/20 1414 09/03/20 1910  NA 143 146* 138  K 4.0 4.0 3.9  CL 112* 112* 109  CO2 19*  --  20*  GLUCOSE 153* 145* 185*  BUN CREATININE 1.64* 1.60* 1.40*  CALCIUM 9.8  --  9.0   Liver Function Tests: Recent Labs  Lab 09/02/20 1355  AST 29  ALT 35  ALKPHOS 60  BILITOT 0.4  PROT 7.4  ALBUMIN 4.3   No results for input(s): LIPASE, AMYLASE in the last 168 hours. No results for input(s): AMMONIA in the last 168 hours. CBC: Recent Labs  Lab 09/02/20 1355 09/02/20 1414  WBC 5.8  --   NEUTROABS  3.7  --   HGB 12.7* 13.6  HCT 42.2 40.0  MCV 91.3  --   PLT 166  --    Cardiac Enzymes: No results for input(s): CKTOTAL, CKMB, CKMBINDEX, TROPONINI in the last 168 hours. BNP: Invalid input(s): POCBNP CBG: Recent Labs  Lab 09/04/20 1243 09/04/20 1547 09/04/20 2102 09/05/20 0601 09/05/20 0738  GLUCAP 153* 167* 203* 157* 168*   D-Dimer No results for input(s): DDIMER in the last 72 hours. Hgb A1c Recent Labs    09/03/20 0503  HGBA1C 7.1*   Lipid Profile Recent Labs    09/03/20 0503  CHOL 79  HDL 25*  LDLCALC 34  TRIG 604  CHOLHDL 3.2   Thyroid function studies No results for input(s): TSH, T4TOTAL, T3FREE, THYROIDAB in the last 72 hours.  Invalid input(s): FREET3 Anemia work up No results for input(s): VITAMINB12, FOLATE, FERRITIN, TIBC, IRON, RETICCTPCT in the last 72 hours. Urinalysis    Component Value Date/Time   COLORURINE STRAW (A) 09/03/2020 1722   APPEARANCEUR CLEAR 09/03/2020 1722   LABSPEC 1.008 09/03/2020 1722   PHURINE 5.0 09/03/2020 1722   GLUCOSEU >=500 (A) 09/03/2020 1722   HGBUR NEGATIVE 09/03/2020 1722   BILIRUBINUR NEGATIVE 09/03/2020 1722   KETONESUR NEGATIVE 09/03/2020 1722   PROTEINUR NEGATIVE 09/03/2020 1722   UROBILINOGEN 0.2 08/29/2015 1125   NITRITE NEGATIVE 09/03/2020 1722   LEUKOCYTESUR NEGATIVE 09/03/2020 1722   Sepsis Labs Invalid input(s): PROCALCITONIN,  WBC,  LACTICIDVEN Microbiology Recent Results (from the past 240 hour(s))  Respiratory Panel by RT PCR (Flu A&B, Covid) - Nasopharyngeal Swab     Status: None   Collection Time: 09/02/20  2:36 PM   Specimen: Nasopharyngeal Swab  Result Value Ref Range Status   SARS Coronavirus 2 by RT PCR NEGATIVE NEGATIVE Final    Comment: (NOTE) SARS-CoV-2 target nucleic acids are NOT DETECTED.  The SARS-CoV-2 RNA is generally detectable in upper respiratoy specimens during the acute phase of infection. The lowest concentration of SARS-CoV-2 viral copies this assay can detect  is 131 copies/mL. A negative result does not preclude SARS-Cov-2 infection and should not be used as the sole basis for treatment or other patient management decisions. A negative result may occur with  improper specimen collection/handling, submission of specimen other than nasopharyngeal swab, presence of viral mutation(s) within the  areas targeted by this assay, and inadequate number of viral copies (<131 copies/mL). A negative result must be combined with clinical observations, patient history, and epidemiological information. The expected result is Negative.  Fact Sheet for Patients:  https://www.moore.com/  Fact Sheet for Healthcare Providers:  https://www.young.biz/  This test is no t yet approved or cleared by the [place] FDA and  has been authorized for detection and/or diagnosis of SARS-CoV-2 by FDA under an Emergency Use Authorization (EUA). This EUA will remain  in effect (meaning this test can be used) for the duration of the COVID-19 declaration under Section 564(b)(1) of the Act, 21 U.S.C. section 360bbb-3(b)(1), unless the authorization is terminated or revoked sooner.     Influenza A by PCR NEGATIVE NEGATIVE Final   Influenza B by PCR NEGATIVE NEGATIVE Final    Comment: (NOTE) The Xpert Xpress SARS-CoV-2/FLU/RSV assay is intended as an aid in  the diagnosis of influenza from Nasopharyngeal swab specimens and  should not be used as a sole basis for treatment. Nasal washings and  aspirates are unacceptable for Xpert Xpress SARS-CoV-2/FLU/RSV  testing.  Fact Sheet for Patients: https://www.moore.com/  Fact Sheet for Healthcare Providers: https://www.young.biz/  This test is not yet approved or cleared by the [place] FDA and  has been authorized for detection and/or diagnosis of SARS-CoV-2 by  FDA under an Emergency Use Authorization (EUA). This EUA will remain  in effect  (meaning this test can be used) for the duration of the  Covid-19 declaration under Section 564(b)(1) of the Act, 21  U.S.C. section 360bbb-3(b)(1), unless the authorization is  terminated or revoked. Performed at [facility], [address], [place], [place] [address]   Urine Culture     Status: None   Collection Time: 09/03/20  3:38 PM   Specimen: Urine, Random  Result Value Ref Range Status   Specimen Description URINE, RANDOM  Final   Special Requests NONE  Final   Culture   Final    NO GROWTH Performed at [facility], [address] [place], [place] [address]    Report Status 09/04/2020 FINAL  Final     Time coordinating discharge: 35 minutes The [place] controlled substances registry was reviewed for this patient     SIGNED:   Alberteen Sam, MD  [facility] 09/05/2020, 9:54 AM

## 2020-09-05 NOTE — Progress Notes (Signed)
Patient is being discharged to [facility]. Report called to [facility] at [facility], discharge instructions placed in packet to be taken with patient by [facility]. Telemetry and IV both removed. Patient to be transported by [facility] with belongings to the side.

## 2020-09-05 NOTE — TOC Transition Note (Signed)
Transition of Care [facility]) - CM/SW Discharge Note   Patient Details  Name: Nicholas [name] MRN: [id number] Date of Birth: December 20, 1960  Transition of Care [facility]) CM/SW Contact:  Kermit Balo, RN Phone Number: 09/05/2020, 12:35 PM   Clinical Narrative:    Pt discharging to [facility] today. [facility] interview completed. Pt will transport via [facility]. Bedside RN updated and d/c packet at the desk.   Room: 112 Number for report: [id number]   Final next level of care: Skilled Nursing Facility Barriers to Discharge: No Barriers Identified   Patient Goals and CMS Choice   CMS Medicare.gov Compare Post Acute Care list provided to:: Patient Choice offered to / list presented to : Patient  Discharge Placement              Patient chooses bed at: [facility] Patient to be transferred to facility by: PTAR Name of family member notified: self Patient and family notified of of transfer: 09/05/20  Discharge Plan and Services In-house Referral: Clinical Social Work Discharge Planning Services: [employer] Consult Post Acute Care Choice: Skilled Nursing Facility                               Social Determinants of Health (SDOH) Interventions     Readmission Risk Interventions No flowsheet data found.

## 2020-09-13 ENCOUNTER — Emergency Department (HOSPITAL_COMMUNITY): Payer: Medicare Other

## 2020-09-13 ENCOUNTER — Encounter (HOSPITAL_COMMUNITY): Payer: Self-pay

## 2020-09-13 ENCOUNTER — Emergency Department (HOSPITAL_COMMUNITY)
Admission: EM | Admit: 2020-09-13 | Discharge: 2020-09-13 | Disposition: A | Discharge status: Home / Self Care | Payer: Medicare Other | Attending: Emergency Medicine | Admitting: Emergency Medicine

## 2020-09-13 DIAGNOSIS — Z5321 Procedure and treatment not carried out due to patient leaving prior to being seen by health care provider: Secondary | ICD-10-CM | POA: Diagnosis not present

## 2020-09-13 DIAGNOSIS — R079 Chest pain, unspecified: Secondary | ICD-10-CM | POA: Diagnosis not present

## 2020-09-13 DIAGNOSIS — R0602 Shortness of breath: Secondary | ICD-10-CM | POA: Insufficient documentation

## 2020-09-13 LAB — BASIC METABOLIC PANEL
Anion gap: 9 (ref 5–15)
BUN: 21 mg/dL — ABNORMAL HIGH (ref 6–20)
CO2: 20 mmol/L — ABNORMAL LOW (ref 22–32)
Calcium: 9.8 mg/dL (ref 8.9–10.3)
Chloride: 111 mmol/L (ref 98–111)
Creatinine, Ser: 1.39 mg/dL — ABNORMAL HIGH (ref 0.61–1.24)
GFR, Estimated: 55 mL/min — ABNORMAL LOW (ref >60)
Glucose, Bld: 179 mg/dL — ABNORMAL HIGH (ref 70–99)
Potassium: 4 mmol/L (ref 3.5–5.1)
Sodium: 140 mmol/L (ref 135–145)

## 2020-09-13 LAB — CBC
HCT: 43.7 % (ref 39.0–52.0)
Hemoglobin: 13.5 g/dL (ref 13.0–17.0)
MCH: 27.5 pg (ref 26.0–34.0)
MCHC: 30.9 g/dL (ref 30.0–36.0)
MCV: 89 fL (ref 80.0–100.0)
Platelets: 197 10*3/uL (ref 150–400)
RBC: 4.91 MIL/uL (ref 4.22–5.81)
RDW: 14.8 % (ref 11.5–15.5)
WBC: 7 10*3/uL (ref 4.0–10.5)
nRBC: 0 % (ref 0.0–0.2)

## 2020-09-13 LAB — TROPONIN I (HIGH SENSITIVITY): Troponin I (High Sensitivity): 4 ng/L (ref <18)

## 2020-09-13 NOTE — ED Triage Notes (Signed)
Pt arrives to ED via [facility] from home w/ c/o 8/10 centrally located non-radiating chest pain that started yesterday. Pt endorses sob, denies n/v, diaphoresis. Resp e/u. EMS VSS.

## 2020-09-13 NOTE — ED Notes (Signed)
Pt stated that he wanted to leave and wanted me to take out his IV

## 2020-09-23 ENCOUNTER — Encounter: Payer: Self-pay | Admitting: Cardiology

## 2020-09-23 ENCOUNTER — Other Ambulatory Visit: Payer: Self-pay

## 2020-09-23 ENCOUNTER — Ambulatory Visit: Payer: Medicare Other | Admitting: Cardiology

## 2020-09-23 VITALS — BP 112/84 | HR 93 | Ht 67.0 in | Wt 192.0 lb

## 2020-09-23 DIAGNOSIS — I429 Cardiomyopathy, unspecified: Secondary | ICD-10-CM | POA: Insufficient documentation

## 2020-09-23 DIAGNOSIS — R0789 Other chest pain: Secondary | ICD-10-CM

## 2020-09-23 DIAGNOSIS — I119 Hypertensive heart disease without heart failure: Secondary | ICD-10-CM

## 2020-09-23 MED ORDER — METOPROLOL TARTRATE 100 MG PO TABS: 100.0000 mg | ORAL_TABLET | Freq: Two times a day (BID) | ORAL | 0 refills | Status: DC

## 2020-09-23 NOTE — Patient Instructions (Signed)
Your cardiac CT will be scheduled at the locations below:   [facility]  [address]  [place], [place] [address]  [phone]    If scheduled at [facility], please arrive at the [facility] main entrance of [facility] 30-45 minutes prior to test start time.  Proceed to the [facility] Radiology Department (first floor) to check-in and test prep.   Please follow these instructions carefully (unless otherwise directed):    Hold all erectile dysfunction medications at least 3 days (72 hrs) prior to test.   On the Night Before the Test:   Be sure to Drink plenty of water.   Do not consume any caffeinated/decaffeinated beverages or chocolate 12 hours prior to your test.   Do not take any antihistamines 12 hours prior to your test.   If the patient has contrast allergy:  Patient will need a prescription for Prednisone and very clear instructions (as follows):  1. Prednisone 50 mg - take 13 hours prior to test  2. Take another Prednisone 50 mg 7 hours prior to test  3. Take another Prednisone 50 mg 1 hour prior to test  4. Take Benadryl 50 mg 1 hour prior to test   Patient must complete all four doses of above prophylactic medications.   Patient will need a ride after test due to Benadryl.   On the Day of the Test:   Drink plenty of water. Do not drink any water within one hour of the test.   Do not eat any food 4 hours prior to the test.   You may take your regular medications prior to the test.    - Metoprolol tartarate 100 mg 2 hours before CT scan You may stop it after the CT scan, unless specified otherwise by me.           After the Test:   Drink plenty of water.   After receiving IV contrast, you may experience a mild flushed feeling. This is normal.   On occasion, you may experience a mild rash up to 24 hours after the test. This is not dangerous. If this occurs, you can take Benadryl 25 mg and increase your fluid intake.    If you experience trouble breathing, this can be serious. If it is severe call 911 IMMEDIATELY. If it is mild, please call our office.   If you take any of these medications: Glipizide/Metformin, Avandament, Glucavance, please do not take 48 hours after completing test unless otherwise instructed.     Please contact the cardiac imaging nurse navigator should you have any questions/concerns  Rockwell Alexandria, RN Navigator Cardiac Imaging  [facility]  [phone] Office  [phone] Cell

## 2020-09-23 NOTE — Progress Notes (Signed)
Follow up visit  Subjective:   Nicholas [name], male    DOB: Jun 24, 1961, 59 y.o.   MRN: [id number]    HPI   Chief Complaint  Patient presents with  . Cardiomyopathy  . Follow-up    59 y.o. African-American male  with hypertension, type 2 diabetes mellitus, hypothyroidism,h/o gastric ulcer, bipolar disorder, h/o seizures, h/o prior tobacco and cocaine abuse, mildly reduced LVEF.  Patient was recently hospitalized with complaints of weakness, numbness, thought to be complex migraine, admitted with left-sided numbness, thought to be complex migraine. He also had atypical chest pain during this hospitalization. Workup showed no ischemia, infarction, on stress test, mildly reduced LVEF.   Patient continues to have episodes of chest tightness. Episodes last up to 30-40 min, are unrelated to exertion.   Current Outpatient Medications on File Prior to Visit  Medication Sig Dispense Refill  . acetaminophen (TYLENOL 8 HOUR) 650 MG CR tablet Take 1 tablet (650 mg total) by mouth every 8 (eight) hours. 30 tablet 0  . albuterol (PROVENTIL HFA;VENTOLIN HFA) 108 (90 Base) MCG/ACT inhaler Inhale 1-2 puffs into the lungs every 6 (six) hours as needed for wheezing or shortness of breath. 1 Inhaler 0  . atorvastatin (LIPITOR) 80 MG tablet Take 1 tablet (80 mg total) by mouth at bedtime. 30 tablet 3  . budesonide-formoterol (SYMBICORT) 80-4.5 MCG/ACT inhaler Inhale 2 puffs into the lungs daily.    . clopidogrel (PLAVIX) 75 MG tablet Take 1 tablet (75 mg total) by mouth daily. 30 tablet 01  . ergocalciferol (VITAMIN D2) 1.25 MG (50000 UT) capsule Take 50,000 Units by mouth every Monday.    [name] FARXIGA 10 MG TABS tablet Take 10 mg by mouth daily.    . fenofibrate (TRICOR) 145 MG tablet Take 145 mg by mouth daily.    . fluticasone-salmeterol (ADVAIR HFA) 115-21 MCG/ACT inhaler Inhale 2 puffs into the lungs daily.    [name] gabapentin (NEURONTIN) 300 MG capsule Take 900 mg by mouth at bedtime.    [name] HUMALOG  KWIKPEN 200 UNIT/ML KwikPen Inject 0-10 Units into the skin 3 (three) times daily with meals. 1 pen 01  . latanoprost (XALATAN) 0.005 % ophthalmic solution Place 1 drop into both eyes at bedtime.     . levETIRAcetam (KEPPRA) 500 MG tablet Take 1 tablet (500 mg total) by mouth 2 (two) times daily. 750 mg twice daily for a month, then on 08/25/20 take 500 mg twice daily for a month then 09/24/20 take 250 mg twice daily for a month then stop 60 tablet 3  . metFORMIN (GLUCOPHAGE) 1000 MG tablet Take 1 tablet (1,000 mg total) by mouth 2 (two) times daily. 60 tablet 01  . nortriptyline (PAMELOR) 50 MG capsule Take 100 mg by mouth at bedtime.     . pantoprazole (PROTONIX) 40 MG tablet Take 40 mg by mouth 2 (two) times daily.    . polyethylene glycol powder (GLYCOLAX/MIRALAX) 17 GM/SCOOP powder Take 17 g by mouth daily as needed for mild constipation.    . rizatriptan (MAXALT) 10 MG tablet Take 10 mg by mouth daily. May repeat in 2 hours if needed     . sertraline (ZOLOFT) 50 MG tablet Take 50 mg by mouth daily.    . SUMAtriptan (IMITREX) 100 MG tablet Take 1 tablet (100 mg total) by mouth daily as needed for migraine or headache. May repeat in 2 hours if headache persists or recurs. 20 tablet 0  . timolol (TIMOPTIC) 0.5 % ophthalmic solution Place 1  drop into both eyes 2 (two) times daily.     . valsartan (DIOVAN) 320 MG tablet Take 320 mg by mouth daily.    [name] zonisamide (ZONEGRAN) 100 MG capsule Take 200 mg by mouth 2 (two) times daily.      No current facility-administered medications on file prior to visit.    Cardiovascular & other pertient studies:   EKG 09/02/2020: Sinus rhythm.  Leftward axis.  Nonspecific T wave abnormality.  Echocardiogram 08/2020: 1. Left ventricular ejection fraction, by estimation, is 45 to 50%. The  left ventricle has mildly decreased function. The left ventricle  demonstrates regional wall motion abnormalities (see scoring  diagram/findings for description). There is  mild left  ventricular hypertrophy. Left ventricular diastolic parameters are  consistent with Grade I diastolic dysfunction (impaired relaxation). There  is moderate hypokinesis of the left ventricular, basal-mid inferolateral  wall and inferior wall suggesting  possible circumflex territory ischemia  2. Right ventricular systolic function is mildly reduced. The right  ventricular size is normal. There is normal pulmonary artery systolic  pressure. The estimated right ventricular systolic pressure is 21.1 mmHg.  3. The mitral valve is abnormal. Trivial mitral valve regurgitation.  4. The aortic valve is tricuspid. Aortic valve regurgitation is not  visualized.  5. The inferior vena cava is normal in size with greater than 50%  respiratory variability, suggesting right atrial pressure of 3 mmHg.   Lexiscan nuclear stress test 08/2020: 1. No reversible ischemia or infarction. 2. Normal left ventricular wall motion. 3. Left ventricular ejection fraction 46% 4. Non invasive risk stratification*: Low  Recent labs: 09/13/2020: Glucose 179, BUN/Cr 21/1.39. EGFR >60. Na/K 140/4.4. Rest of the CMP normal HbA1C 7.1% Chol 79, TG 102, HDL 25, LDL 34 TSH 4.2 normal Trop HS 4,4    Review of Systems  Cardiovascular: Positive for chest pain. Negative for dyspnea on exertion, leg swelling, palpitations and syncope.         Vitals:   09/23/20 1242  BP: 112/84  Pulse: 93  SpO2: 96%     Body mass index is 30.07 kg/m. Filed Weights   09/23/20 1242  Weight: 192 lb (87.1 kg)     Objective:   Physical Exam Vitals and nursing note reviewed.  Constitutional:      General: He is not in acute distress. Neck:     Vascular: No JVD.  Cardiovascular:     Rate and Rhythm: Normal rate and regular rhythm.     Heart sounds: Normal heart sounds. No murmur heard.   Pulmonary:     Effort: Pulmonary effort is normal.     Breath sounds: Normal breath sounds. No wheezing or rales.            Assessment & Recommendations:   59 y.o. African-American male  with hypertension, type 2 diabetes mellitus, hypothyroidism,h/o gastric ulcer, bipolar disorder, h/o seizures, h/o prior tobacco and cocaine abuse, mildly reduced LVEF.  Chest pain: Atypical, but has risk factors for CAD. Stress test with no ischemia, but mildly reduced EF. Will obtain coronary CTA for definitive coronary anatomy evaluation.  Mildly reduced EF: Likely hypertensive cardiomyopathy.  No clinical signs of heart failure. Conntue valsartan.  F/u as needed.    Nigel Mormon, MD Pager: [phone] Office: [phone]

## 2020-09-30 ENCOUNTER — Other Ambulatory Visit: Payer: Self-pay | Admitting: Physician Assistant

## 2020-09-30 ENCOUNTER — Ambulatory Visit
Admission: RE | Admit: 2020-09-30 | Discharge: 2020-09-30 | Disposition: A | Discharge status: Home / Self Care | Payer: Medicare Other | Source: Ambulatory Visit | Attending: Physician Assistant | Admitting: Physician Assistant

## 2020-09-30 ENCOUNTER — Other Ambulatory Visit: Payer: Self-pay

## 2020-09-30 DIAGNOSIS — M545 Low back pain, unspecified: Secondary | ICD-10-CM

## 2020-09-30 DIAGNOSIS — M549 Dorsalgia, unspecified: Secondary | ICD-10-CM

## 2020-09-30 DIAGNOSIS — M542 Cervicalgia: Secondary | ICD-10-CM

## 2020-09-30 DIAGNOSIS — G894 Chronic pain syndrome: Secondary | ICD-10-CM

## 2020-10-07 ENCOUNTER — Ambulatory Visit: Payer: Medicare Other | Admitting: Neurology

## 2020-10-15 ENCOUNTER — Emergency Department (HOSPITAL_COMMUNITY): Payer: Medicare Other

## 2020-10-15 ENCOUNTER — Emergency Department (HOSPITAL_COMMUNITY)
Admission: EM | Admit: 2020-10-15 | Discharge: 2020-10-15 | Disposition: A | Discharge status: Home / Self Care | Payer: Medicare Other | Attending: Emergency Medicine | Admitting: Emergency Medicine

## 2020-10-15 DIAGNOSIS — N183 Chronic kidney disease, stage 3 unspecified: Secondary | ICD-10-CM | POA: Diagnosis not present

## 2020-10-15 DIAGNOSIS — Z87891 Personal history of nicotine dependence: Secondary | ICD-10-CM | POA: Diagnosis not present

## 2020-10-15 DIAGNOSIS — Z794 Long term (current) use of insulin: Secondary | ICD-10-CM | POA: Insufficient documentation

## 2020-10-15 DIAGNOSIS — Z79899 Other long term (current) drug therapy: Secondary | ICD-10-CM | POA: Insufficient documentation

## 2020-10-15 DIAGNOSIS — I129 Hypertensive chronic kidney disease with stage 1 through stage 4 chronic kidney disease, or unspecified chronic kidney disease: Secondary | ICD-10-CM | POA: Insufficient documentation

## 2020-10-15 DIAGNOSIS — R569 Unspecified convulsions: Secondary | ICD-10-CM | POA: Insufficient documentation

## 2020-10-15 DIAGNOSIS — E1122 Type 2 diabetes mellitus with diabetic chronic kidney disease: Secondary | ICD-10-CM | POA: Insufficient documentation

## 2020-10-15 DIAGNOSIS — Z7984 Long term (current) use of oral hypoglycemic drugs: Secondary | ICD-10-CM | POA: Diagnosis not present

## 2020-10-15 DIAGNOSIS — Z8673 Personal history of transient ischemic attack (TIA), and cerebral infarction without residual deficits: Secondary | ICD-10-CM | POA: Diagnosis not present

## 2020-10-15 DIAGNOSIS — Z7951 Long term (current) use of inhaled steroids: Secondary | ICD-10-CM | POA: Diagnosis not present

## 2020-10-15 DIAGNOSIS — J45909 Unspecified asthma, uncomplicated: Secondary | ICD-10-CM | POA: Diagnosis not present

## 2020-10-15 LAB — CBC WITH DIFFERENTIAL/PLATELET
Abs Immature Granulocytes: 0.02 10*3/uL (ref 0.00–0.07)
Basophils Absolute: 0 10*3/uL (ref 0.0–0.1)
Basophils Relative: 1 %
Eosinophils Absolute: 0.1 10*3/uL (ref 0.0–0.5)
Eosinophils Relative: 2 %
HCT: 39.3 % (ref 39.0–52.0)
Hemoglobin: 12.5 g/dL — ABNORMAL LOW (ref 13.0–17.0)
Immature Granulocytes: 0 %
Lymphocytes Relative: 33 %
Lymphs Abs: 1.8 10*3/uL (ref 0.7–4.0)
MCH: 28.3 pg (ref 26.0–34.0)
MCHC: 31.8 g/dL (ref 30.0–36.0)
MCV: 88.9 fL (ref 80.0–100.0)
Monocytes Absolute: 0.5 10*3/uL (ref 0.1–1.0)
Monocytes Relative: 9 %
Neutro Abs: 3.1 10*3/uL (ref 1.7–7.7)
Neutrophils Relative %: 55 %
Platelets: 168 10*3/uL (ref 150–400)
RBC: 4.42 MIL/uL (ref 4.22–5.81)
RDW: 14.6 % (ref 11.5–15.5)
WBC: 5.6 10*3/uL (ref 4.0–10.5)
nRBC: 0 % (ref 0.0–0.2)

## 2020-10-15 LAB — COMPREHENSIVE METABOLIC PANEL
ALT: 39 U/L (ref 0–44)
AST: 28 U/L (ref 15–41)
Albumin: 4 g/dL (ref 3.5–5.0)
Alkaline Phosphatase: 70 U/L (ref 38–126)
Anion gap: 6 (ref 5–15)
BUN: 17 mg/dL (ref 6–20)
CO2: 26 mmol/L (ref 22–32)
Calcium: 9.4 mg/dL (ref 8.9–10.3)
Chloride: 112 mmol/L — ABNORMAL HIGH (ref 98–111)
Creatinine, Ser: 1.33 mg/dL — ABNORMAL HIGH (ref 0.61–1.24)
GFR, Estimated: >60 mL/min (ref >60)
Glucose, Bld: 71 mg/dL (ref 70–99)
Potassium: 3.9 mmol/L (ref 3.5–5.1)
Sodium: 144 mmol/L (ref 135–145)
Total Bilirubin: 0.5 mg/dL (ref 0.3–1.2)
Total Protein: 6.8 g/dL (ref 6.5–8.1)

## 2020-10-15 LAB — ETHANOL: Alcohol, Ethyl (B): <10 mg/dL (ref <10)

## 2020-10-15 LAB — CBG MONITORING, ED: Glucose-Capillary: 81 mg/dL (ref 70–99)

## 2020-10-15 LAB — LACTIC ACID, PLASMA: Lactic Acid, Venous: 1.4 mmol/L (ref 0.5–1.9)

## 2020-10-15 MED ORDER — SODIUM CHLORIDE 0.9 % IV SOLN: INTRAVENOUS | Status: DC

## 2020-10-15 MED ORDER — SODIUM CHLORIDE 0.9 % IV BOLUS
1000.0000 mL | Freq: Once | INTRAVENOUS | Status: AC
  Administered 2020-10-15: 1000 mL via INTRAVENOUS

## 2020-10-15 NOTE — Discharge Instructions (Addendum)
As discussed, your evaluation today has been largely reassuring.  But, it is important that you monitor your condition carefully, and do not hesitate to return to the ED if you develop new, or concerning changes in your condition.  Otherwise, please follow-up with your physician for appropriate ongoing care.  Please be sure to take all medication as directed, including your Keppra.

## 2020-10-15 NOTE — ED Notes (Addendum)
Pt now opening eyes, moving hands and toes. Responds to verbal stimuli. MD made aware.

## 2020-10-15 NOTE — ED Notes (Signed)
Pt does not have his cell phone or house keys. Pt states " I think my CNA has them but I dont know her number by heart." Pt request that this nurse contact his sister. Pts sister states she does not have any information pertaining to the CNA. This nurse provided pt with a phone to contact sister and speak with her personally. Pt currently on phone trying to figure out how to get into his home

## 2020-10-15 NOTE — ED Provider Notes (Signed)
[facility]-EMERGENCY DEPT Provider Note   CSN: [id number] Arrival date & time: 10/15/20  1423     History Chief Complaint  Patient presents with  . Unresponsive    ANTOIN DARGIS is a 59 y.o. male.  HPI Patient with history of multiple medical issues including bipolar disorder, pseudoseizures and seizures as well as complex migraines, recent admission presents after reportedly being found unresponsive, minimally interactive.  History is obtained by the patient once he is awakened, though he does not recall exactly what happened.  Initial history is obtained by EMS providers. Reportedly the patient was found unresponsive, received bag-valve-mask resuscitation, did not require intubation, had improvement in his respiratory status, but remained essentially noninteractive throughout. No EMS report of prior trauma, hemodynamic instability. Reportedly the patient was at home, on the telephone, speaking with someone who is coming to assist him when he reportedly sounded sleepy.  EMS did provide Narcan, though this did not have a noted change in his condition.  In route he had an episode of seizure-like activity, reportedly, received 2.5 mg of Versed.  Additional history obtained on chart review, including documentation from recent hospitalization with long-term monitoring of seizure-like activity, below: Discharge Diagnoses:    Complex migraine causing hemiparesis Patient was admitted, given Compazine and Benadryl IV, and IV fluids.  His headache improved.  An MRI brain was unremarkable.  Stroke was ruled out.   PT evaluated the patient, found him to have persistent generalized weakness, and recommended skilled nursing rehab.  He has a good rehab potential, and should follow up with his headache specialist as an outpatient.   Past Medical History:  Diagnosis Date  . Asthma   . Bipolar 1 disorder (HCC)   . Borderline glaucoma   . Chronic pain   . Epididymal pain     LEFT  . 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 Feeling of incomplete bladder emptying   . Frequency of urination   . Gastric ulcer   . GERD (gastroesophageal reflux disease)   . Hypertension   . Hyperthyroidism    NO MEDS   . Migraine   . Seizures (HCC)   . TIA (transient ischemic attack)   . Type 2 diabetes mellitus [facility])     Patient Active Problem List   Diagnosis Date Noted  . Cardiomyopathy (HCC) 09/23/2020  . Atypical chest pain 09/23/2020  . Precordial pain   . CVA (cerebral vascular accident) (HCC) 09/02/2020  . Altered mental status 07/23/2020  . Seizure (HCC) 07/22/2020  . Left-sided weakness 07/04/2020  . Dysarthria 07/04/2020  . Essential hypertension   . Seizure disorder (HCC) 07/02/2017  . Insulin-requiring or dependent type II diabetes mellitus (HCC) 07/02/2017  . CKD (chronic kidney disease), stage III (HCC) 07/02/2017  . Normocytic anemia 07/02/2017  . Testicular/scrotal pain 11/09/2013  . Microhematuria 11/09/2013  . Condyloma acuminatum of scrotum 11/09/2013    Past Surgical History:  Procedure Laterality Date  . ABDOMINAL SURGERY    . ANTERIOR CERVICAL DECOMP/DISCECTOMY FUSION  2007   C4  --  C6  . CERVICAL FUSION    . CHOLECYSTECTOMY    . CYSTOSCOPY N/A 11/09/2013   Procedure: CYSTOSCOPY FLEXIBLE;  Surgeon: Bjorn Pippin, MD;  Location: [facility];  Service: Urology;  Laterality: N/A;  . EPIDIDYMECTOMY Left 11/09/2013   Procedure:  LEFT EPIDIDYMECTOMY;  Surgeon: Jonny Ruiz  Annabell HowellsWrenn, MD;  [facility];  Service: Urology;  Laterality: Left;  POSSIBLE OUTPATIENT WITH OBSERVATION  . EXCISION LIPOMA LEFT SHOULDER  2004 (APPROX)  . MULTIPLE CYST REMOVED FROM CHEST  AGE 51  . OTHER SURGICAL HISTORY     hemorroid surgery   . TESTICLE REMOVAL Left   . TONSILLECTOMY         Family History  Problem Relation Age of Onset  . Heart  failure Mother   . Diabetes Mother   . Hypertension Mother   . Cirrhosis Father   . Heart failure Father   . Kidney disease Father     Social History   Tobacco Use  . Smoking status: Former Smoker    Packs/day: 0.25    Years: 15.00    Pack years: 3.75    Types: Cigarettes    Quit date: 11/08/1992    Years since quitting: 27.9  . Smokeless tobacco: Never Used  Vaping Use  . Vaping Use: Never used  Substance Use Topics  . Alcohol use: No  . Drug use: No    Home Medications Prior to Admission medications   Medication Sig Start Date End Date Taking? Authorizing Provider  acetaminophen (TYLENOL 8 HOUR) 650 MG CR tablet Take 1 tablet (650 mg total) by mouth every 8 (eight) hours. 05/09/19   Derwood KaplanNanavati, Ankit, MD  albuterol (PROVENTIL HFA;VENTOLIN HFA) 108 (90 Base) MCG/ACT inhaler Inhale 1-2 puffs into the lungs every 6 (six) hours as needed for wheezing or shortness of breath. 11/15/18   Rancour, Jeannett SeniorStephen, MD  atorvastatin (LIPITOR) 80 MG tablet Take 1 tablet (80 mg total) by mouth at bedtime. 09/05/20   Danford, Earl Liteshristopher P, MD  budesonide-formoterol (SYMBICORT) 80-4.5 MCG/ACT inhaler Inhale 2 puffs into the lungs daily.    [provider]  clopidogrel (PLAVIX) 75 MG tablet Take 1 tablet (75 mg total) by mouth daily. 07/05/20   Azucena FallenLancaster, William C, MD  ergocalciferol (VITAMIN D2) 1.25 MG (50000 UT) capsule Take 50,000 Units by mouth every Monday.    [provider]  FARXIGA 10 MG TABS tablet Take 10 mg by mouth daily. 08/01/20   [provider]  fenofibrate (TRICOR) 145 MG tablet Take 145 mg by mouth daily. 10/24/19   [provider]  fluticasone-salmeterol (ADVAIR HFA) 115-21 MCG/ACT inhaler Inhale 2 puffs into the lungs daily.    [provider]  gabapentin (NEURONTIN) 300 MG capsule Take 900 mg by mouth at bedtime.    [provider]  HUMALOG KWIKPEN 200 UNIT/ML KwikPen Inject 0-10 Units into the skin 3 (three) times daily with  meals. 07/05/20   Azucena FallenLancaster, William C, MD  latanoprost (XALATAN) 0.005 % ophthalmic solution Place 1 drop into both eyes at bedtime.  07/04/20   [provider]  levETIRAcetam (KEPPRA) 500 MG tablet Take 1 tablet (500 mg total) by mouth 2 (two) times daily. 750 mg twice daily for a month, then on 08/25/20 take 500 mg twice daily for a month then 09/24/20 take 250 mg twice daily for a month then stop 09/05/20   Danford, Earl Liteshristopher P, MD  metFORMIN (GLUCOPHAGE) 1000 MG tablet Take 1 tablet (1,000 mg total) by mouth 2 (two) times daily. Patient taking differently: Take 500 mg by mouth daily with breakfast.  07/05/20   Azucena FallenLancaster, William C, MD  metoprolol tartrate (LOPRESSOR) 100 MG tablet Take 1 tablet (100 mg total) by mouth 2 (two) times daily. - Metoprolol tartarate 100 mg 2 hours before CT scan 09/23/20 12/22/20  Patwardhan, Manish J, MD  nortriptyline (PAMELOR) 50 MG capsule Take 100 mg by mouth at bedtime.     [provider]  pantoprazole (PROTONIX) 40 MG tablet Take 40 mg by mouth 2 (two) times daily. 07/03/20   [provider]  polyethylene glycol powder (GLYCOLAX/MIRALAX) 17 GM/SCOOP powder Take 17 g by mouth daily as needed for mild constipation. 12/31/15   [provider]  rizatriptan (MAXALT) 10 MG tablet Take 10 mg by mouth daily. May repeat in 2 hours if needed     [provider]  sertraline (ZOLOFT) 50 MG tablet Take 50 mg by mouth daily. 06/13/20   [provider]  SUMAtriptan (IMITREX) 100 MG tablet Take 1 tablet (100 mg total) by mouth daily as needed for migraine or headache. May repeat in 2 hours if headache persists or recurs. 07/25/20   Leatha Gilding, MD  timolol (TIMOPTIC) 0.5 % ophthalmic solution Place 1 drop into both eyes 2 (two) times daily.  04/06/20   [provider]  valsartan (DIOVAN) 320 MG tablet Take 320 mg by mouth daily. 07/04/20   [provider]  zonisamide (ZONEGRAN) 100 MG capsule Take 200 mg by  mouth 2 (two) times daily.  05/17/20   [provider]    Allergies    Levothyroxine, Nsaids, Amoxicillin, Ampicillin, Penicillins, Strawberry extract, Asa [aspirin], Bactrim [sulfamethoxazole-trimethoprim], Depakote [divalproex sodium], Dilantin [phenytoin], Methocarbamol, Risperidone and related, Sitagliptin, Strawberry (diagnostic), Sulfa antibiotics, Tolmetin, Ultram [tramadol hcl], and Oatmeal  Review of Systems   Review of Systems  Unable to perform ROS: Acuity of condition    Physical Exam Updated Vital Signs BP 131/85   Pulse 72   Temp 98.2 F (36.8 C) (Rectal)   Resp 19   SpO2 100%   Physical Exam Vitals and nursing note reviewed.  Constitutional:      General: He is not in acute distress.    Appearance: He is well-developed. He is not ill-appearing or toxic-appearing.     Comments: Initially patient response to painful stimuli, though he is seemingly also following activity around him, he is not participatory.  Subsequently, the patient is awake, alert, sitting upright, speaking clearly, denying complaints.  HENT:     Head: Normocephalic and atraumatic.  Eyes:     Conjunctiva/sclera: Conjunctivae normal.  Cardiovascular:     Rate and Rhythm: Normal rate and regular rhythm.  Pulmonary:     Effort: Pulmonary effort is normal. No respiratory distress.     Breath sounds: No stridor.  Abdominal:     General: There is no distension.  Skin:    General: Skin is warm and dry.  Neurological:     Mental Status: He is oriented to person, place, and time.     ED Results / Procedures / Treatments   Labs (all labs ordered are listed, but only abnormal results are displayed) Labs Reviewed  COMPREHENSIVE METABOLIC PANEL - Abnormal; Notable for the following components:      Result Value   Chloride 112 (*)    Creatinine, Ser 1.33 (*)    All other components within normal limits  CBC WITH DIFFERENTIAL/PLATELET - Abnormal; Notable for the following components:    Hemoglobin 12.5 (*)    All other components within normal limits  LACTIC ACID, PLASMA  ETHANOL  RAPID URINE DRUG SCREEN, HOSP PERFORMED  URINALYSIS, ROUTINE W REFLEX MICROSCOPIC  CBG MONITORING, ED    EKG EKG Interpretation  Date/Time:  Tuesday October 15 2020 14:32:19 EST Ventricular Rate:  78 PR Interval:    QRS Duration: 95 QT Interval:  383 QTC Calculation: 437 R Axis:   -35 Text Interpretation: Sinus rhythm Left axis deviation Borderline T abnormalities, diffuse leads Abnormal ECG Confirmed by Gerhard Munch [phone]) on 10/15/2020 2:53:08 PM   Radiology CT Head Wo Contrast  Result Date: 10/15/2020 CLINICAL DATA:  Delirium. EXAM: CT HEAD WITHOUT CONTRAST TECHNIQUE: Contiguous axial images were obtained from the base of the skull through the vertex without intravenous contrast. COMPARISON:  September 02, 2020. FINDINGS: Brain: No evidence of acute infarction, hemorrhage, hydrocephalus, extra-axial collection or mass lesion/mass effect. Vascular: No hyperdense vessel or unexpected calcification. Skull: Normal. Negative for fracture or focal lesion. Sinuses/Orbits: No acute finding. Other: None. IMPRESSION: Normal head CT. Electronically Signed   By: Lupita Raider M.D.   On: 10/15/2020 17:23   DG Chest Port 1 View  Result Date: 10/15/2020 CLINICAL DATA:  Altered mental status.  Episode of apnea EXAM: PORTABLE CHEST 1 VIEW COMPARISON:  September 13, 2020 FINDINGS: There is no edema or airspace opacity. Heart is slightly enlarged with pulmonary vascularity normal. No adenopathy. No pneumothorax. No bone lesions. IMPRESSION: Slight cardiac prominence.  No edema or airspace opacity. Electronically Signed   By: Bretta Bang III M.D.   On: 10/15/2020 14:57    Procedures Procedures (including critical care time)  Medications Ordered in ED Medications  sodium chloride 0.9 % bolus 1,000 mL (1,000 mLs Intravenous New Bag/Given 10/15/20 1455)    And  0.9 %  sodium chloride infusion  (has no administration in time range)    ED Course  I have reviewed the triage vital signs and the nursing notes.  Pertinent labs & imaging results that were available during my care of the patient were reviewed by me and considered in my medical decision making (see chart for details).   With consideration of seizure versus pseudoseizure versus complex migraine versus altered mental status secondary to metabolic or infectious etiology, CT, x-ray, labs ordered, continuous pulse oximetry and cardiac monitoring ordered.  Update:, Patient now awake and alert, moving all extremities, slightly unsure of what occurred, but with no ongoing complaints. Pulse oximetry 100% room air Sinus rhythm on monitor, rate 70s, unremarkable   MDM Rules/Calculators/A&P Adult male with a history of seizures, pseudoseizures and complex migraines presents after an episode of unresponsiveness.  Though the patient is initially noninteractive, he awakens entirely, has no ongoing complaints, CT scan, x-ray, labs reviewed, discussed, generally unremarkable aside from mild dehydration. Patient had fluid resuscitation here, and after period of monitoring, no decompensation, substantial clinical improvement as above, he was discharged in stable condition. Final Clinical Impression(s) / ED Diagnoses Final diagnoses:  Seizure-like activity [facility])    Rx / DC Orders ED Discharge Orders    None       Gerhard Munch, MD 10/16/20 [phone]

## 2020-10-15 NOTE — ED Triage Notes (Addendum)
Pt arrived via EMS< from home, was on phone with tech who was coming over to assist pt, tech stated pt sounded sleepy and "out of it" on phone. On EMS arrival pt apneic, no spontaneous breathing.  Given 1mg  narcan with no effect. Not responding to painful stimuli.   En route, pt woke, seizing in ambulance. Given 2.5 mg versed.   On arrival to ED, pt breathing on own, but not responding to verbal or painful stimuli.   Known seizure hx.   CBG 99

## 2020-10-15 NOTE — ED Notes (Signed)
CBG 81.  

## 2020-10-21 ENCOUNTER — Telehealth (HOSPITAL_COMMUNITY): Payer: Self-pay | Admitting: Emergency Medicine

## 2020-10-21 ENCOUNTER — Telehealth: Payer: Self-pay

## 2020-10-21 ENCOUNTER — Other Ambulatory Visit: Payer: Self-pay

## 2020-10-21 DIAGNOSIS — R0789 Other chest pain: Secondary | ICD-10-CM

## 2020-10-21 MED ORDER — METOPROLOL TARTRATE 100 MG PO TABS: 100.0000 mg | ORAL_TABLET | Freq: Two times a day (BID) | ORAL | 0 refills | Status: DC

## 2020-10-21 NOTE — Telephone Encounter (Signed)
Attempted to call patient regarding upcoming cardiac CT appointment. °Left message on voicemail with name and callback number °Trisha Morandi RN Navigator Cardiac Imaging °[facility] °[phone] Office °[phone] Cell ° °

## 2020-10-21 NOTE — Telephone Encounter (Signed)
Done resend his metoprolol

## 2020-10-22 ENCOUNTER — Ambulatory Visit (HOSPITAL_COMMUNITY)
Admission: RE | Admit: 2020-10-22 | Discharge: 2020-10-22 | Disposition: A | Discharge status: Home / Self Care | Payer: Medicare Other | Source: Ambulatory Visit | Attending: Cardiology | Admitting: Cardiology

## 2020-10-22 ENCOUNTER — Other Ambulatory Visit: Payer: Self-pay

## 2020-10-22 ENCOUNTER — Encounter: Payer: Self-pay | Admitting: *Deleted

## 2020-10-22 DIAGNOSIS — R0789 Other chest pain: Secondary | ICD-10-CM | POA: Insufficient documentation

## 2020-10-22 DIAGNOSIS — I429 Cardiomyopathy, unspecified: Secondary | ICD-10-CM | POA: Insufficient documentation

## 2020-10-22 DIAGNOSIS — R9431 Abnormal electrocardiogram [ECG] [EKG]: Secondary | ICD-10-CM | POA: Diagnosis not present

## 2020-10-22 DIAGNOSIS — I10 Essential (primary) hypertension: Secondary | ICD-10-CM | POA: Insufficient documentation

## 2020-10-22 DIAGNOSIS — Z006 Encounter for examination for normal comparison and control in clinical research program: Secondary | ICD-10-CM

## 2020-10-22 MED ORDER — IOHEXOL 350 MG/ML SOLN
80.0000 mL | Freq: Once | INTRAVENOUS | Status: AC | PRN
  Administered 2020-10-22: 80 mL via INTRAVENOUS

## 2020-10-22 MED ORDER — NITROGLYCERIN 0.4 MG SL SUBL
0.8000 mg | SUBLINGUAL_TABLET | Freq: Once | SUBLINGUAL | Status: AC
  Administered 2020-10-22: 0.8 mg via SUBLINGUAL

## 2020-10-22 MED ORDER — NITROGLYCERIN 0.4 MG SL SUBL
SUBLINGUAL_TABLET | SUBLINGUAL | Status: AC
  Filled 2020-10-22: qty 2

## 2020-10-22 NOTE — Research (Signed)
IDENTIFY Informed Consent                  Subject Name:  Nicholas [name]   Subject met inclusion and exclusion criteria.  The informed consent form, study requirements and expectations were reviewed with the subject and questions and concerns were addressed prior to the signing of the consent form.  The subject verbalized understanding of the trial requirements.  The subject agreed to participate in the IDENTIFY trial and signed the informed consent.  The informed consent was obtained prior to performance of any protocol-specific procedures for the subject.  A copy of the signed informed consent was given to the subject and a copy was placed in the subject's medical record.   [place] Raigan Baria, Research Assistant  10/22/2020  07:06 a.m.

## 2020-10-23 ENCOUNTER — Ambulatory Visit: Payer: Medicare Other | Admitting: Neurology

## 2020-10-24 NOTE — Progress Notes (Signed)
Unable to reach patient. Left vm to cb.

## 2020-10-24 NOTE — Progress Notes (Signed)
Spoke with patient. Patient voiced understanding.

## 2020-10-31 ENCOUNTER — Observation Stay (HOSPITAL_COMMUNITY)
Admission: EM | Admit: 2020-10-31 | Discharge: 2020-11-02 | Disposition: A | Discharge status: Home / Self Care | Payer: Medicare Other | Attending: Internal Medicine | Admitting: Internal Medicine

## 2020-10-31 ENCOUNTER — Encounter (HOSPITAL_COMMUNITY): Payer: Self-pay

## 2020-10-31 ENCOUNTER — Other Ambulatory Visit: Payer: Self-pay

## 2020-10-31 DIAGNOSIS — J45909 Unspecified asthma, uncomplicated: Secondary | ICD-10-CM | POA: Diagnosis not present

## 2020-10-31 DIAGNOSIS — Z7901 Long term (current) use of anticoagulants: Secondary | ICD-10-CM | POA: Insufficient documentation

## 2020-10-31 DIAGNOSIS — E039 Hypothyroidism, unspecified: Secondary | ICD-10-CM | POA: Diagnosis not present

## 2020-10-31 DIAGNOSIS — Z87891 Personal history of nicotine dependence: Secondary | ICD-10-CM | POA: Insufficient documentation

## 2020-10-31 DIAGNOSIS — Z79899 Other long term (current) drug therapy: Secondary | ICD-10-CM | POA: Insufficient documentation

## 2020-10-31 DIAGNOSIS — Z20822 Contact with and (suspected) exposure to covid-19: Secondary | ICD-10-CM | POA: Diagnosis not present

## 2020-10-31 DIAGNOSIS — Z794 Long term (current) use of insulin: Secondary | ICD-10-CM

## 2020-10-31 DIAGNOSIS — E1140 Type 2 diabetes mellitus with diabetic neuropathy, unspecified: Secondary | ICD-10-CM

## 2020-10-31 DIAGNOSIS — E119 Type 2 diabetes mellitus without complications: Secondary | ICD-10-CM | POA: Diagnosis not present

## 2020-10-31 DIAGNOSIS — I10 Essential (primary) hypertension: Secondary | ICD-10-CM | POA: Diagnosis present

## 2020-10-31 DIAGNOSIS — Z7984 Long term (current) use of oral hypoglycemic drugs: Secondary | ICD-10-CM | POA: Insufficient documentation

## 2020-10-31 DIAGNOSIS — N183 Chronic kidney disease, stage 3 unspecified: Secondary | ICD-10-CM | POA: Diagnosis not present

## 2020-10-31 DIAGNOSIS — K625 Hemorrhage of anus and rectum: Principal | ICD-10-CM | POA: Insufficient documentation

## 2020-10-31 DIAGNOSIS — I129 Hypertensive chronic kidney disease with stage 1 through stage 4 chronic kidney disease, or unspecified chronic kidney disease: Secondary | ICD-10-CM | POA: Diagnosis not present

## 2020-10-31 DIAGNOSIS — N179 Acute kidney failure, unspecified: Secondary | ICD-10-CM | POA: Diagnosis present

## 2020-10-31 LAB — RESP PANEL BY RT-PCR (FLU A&B, COVID) ARPGX2
Influenza A by PCR: NEGATIVE
Influenza B by PCR: NEGATIVE
SARS Coronavirus 2 by RT PCR: NEGATIVE

## 2020-10-31 LAB — COMPREHENSIVE METABOLIC PANEL
ALT: 40 U/L (ref 0–44)
AST: 33 U/L (ref 15–41)
Albumin: 4.4 g/dL (ref 3.5–5.0)
Alkaline Phosphatase: 69 U/L (ref 38–126)
Anion gap: 11 (ref 5–15)
BUN: 19 mg/dL (ref 6–20)
CO2: 22 mmol/L (ref 22–32)
Calcium: 9.8 mg/dL (ref 8.9–10.3)
Chloride: 106 mmol/L (ref 98–111)
Creatinine, Ser: 1.68 mg/dL — ABNORMAL HIGH (ref 0.61–1.24)
GFR, Estimated: 47 mL/min — ABNORMAL LOW (ref >60)
Glucose, Bld: 167 mg/dL — ABNORMAL HIGH (ref 70–99)
Potassium: 4.3 mmol/L (ref 3.5–5.1)
Sodium: 139 mmol/L (ref 135–145)
Total Bilirubin: 0.4 mg/dL (ref 0.3–1.2)
Total Protein: 7.2 g/dL (ref 6.5–8.1)

## 2020-10-31 LAB — TYPE AND SCREEN
ABO/RH(D): A POS
Antibody Screen: NEGATIVE

## 2020-10-31 LAB — CBC
HCT: 43.1 % (ref 39.0–52.0)
Hemoglobin: 13.5 g/dL (ref 13.0–17.0)
MCH: 27.6 pg (ref 26.0–34.0)
MCHC: 31.3 g/dL (ref 30.0–36.0)
MCV: 88 fL (ref 80.0–100.0)
Platelets: 173 10*3/uL (ref 150–400)
RBC: 4.9 MIL/uL (ref 4.22–5.81)
RDW: 15 % (ref 11.5–15.5)
WBC: 6.8 10*3/uL (ref 4.0–10.5)
nRBC: 0 % (ref 0.0–0.2)

## 2020-10-31 LAB — CBG MONITORING, ED: Glucose-Capillary: 141 mg/dL — ABNORMAL HIGH (ref 70–99)

## 2020-10-31 MED ORDER — GABAPENTIN 300 MG PO CAPS
1200.0000 mg | ORAL_CAPSULE | Freq: Every day | ORAL | Status: DC
  Administered 2020-10-31 – 2020-11-01 (×2): 1200 mg via ORAL
  Filled 2020-10-31 (×2): qty 4

## 2020-10-31 MED ORDER — ONDANSETRON HCL 4 MG/2ML IJ SOLN: 4.0000 mg | Freq: Four times a day (QID) | INTRAMUSCULAR | Status: DC | PRN

## 2020-10-31 MED ORDER — GABAPENTIN 300 MG PO CAPS
600.0000 mg | ORAL_CAPSULE | Freq: Every day | ORAL | Status: DC
  Administered 2020-11-01 – 2020-11-02 (×2): 600 mg via ORAL
  Filled 2020-10-31 (×2): qty 2

## 2020-10-31 MED ORDER — SODIUM CHLORIDE 0.9 % IV SOLN: INTRAVENOUS | Status: DC

## 2020-10-31 MED ORDER — METOPROLOL TARTRATE 100 MG PO TABS
100.0000 mg | ORAL_TABLET | Freq: Two times a day (BID) | ORAL | Status: DC
  Administered 2020-10-31 – 2020-11-02 (×4): 100 mg via ORAL
  Filled 2020-10-31 (×3): qty 1
  Filled 2020-10-31: qty 4

## 2020-10-31 MED ORDER — INSULIN ASPART 100 UNIT/ML ~~LOC~~ SOLN
0.0000 [IU] | Freq: Three times a day (TID) | SUBCUTANEOUS | Status: DC
  Administered 2020-11-01 – 2020-11-02 (×4): 2 [IU] via SUBCUTANEOUS

## 2020-10-31 MED ORDER — MOMETASONE FURO-FORMOTEROL FUM 100-5 MCG/ACT IN AERO
2.0000 | INHALATION_SPRAY | Freq: Two times a day (BID) | RESPIRATORY_TRACT | Status: DC
  Administered 2020-11-01 – 2020-11-02 (×3): 2 via RESPIRATORY_TRACT
  Filled 2020-10-31: qty 8.8

## 2020-10-31 MED ORDER — ATORVASTATIN CALCIUM 80 MG PO TABS
80.0000 mg | ORAL_TABLET | Freq: Every day | ORAL | Status: DC
  Administered 2020-11-01 – 2020-11-02 (×2): 80 mg via ORAL
  Filled 2020-10-31 (×2): qty 1

## 2020-10-31 MED ORDER — SERTRALINE HCL 50 MG PO TABS
50.0000 mg | ORAL_TABLET | Freq: Every day | ORAL | Status: DC
  Administered 2020-11-01 – 2020-11-02 (×2): 50 mg via ORAL
  Filled 2020-10-31 (×3): qty 1

## 2020-10-31 MED ORDER — FENOFIBRATE 160 MG PO TABS
160.0000 mg | ORAL_TABLET | Freq: Every day | ORAL | Status: DC
  Administered 2020-11-01 – 2020-11-02 (×2): 160 mg via ORAL
  Filled 2020-10-31 (×2): qty 1

## 2020-10-31 MED ORDER — PANTOPRAZOLE SODIUM 40 MG IV SOLR
40.0000 mg | Freq: Two times a day (BID) | INTRAVENOUS | Status: DC
  Administered 2020-11-01 – 2020-11-02 (×3): 40 mg via INTRAVENOUS
  Filled 2020-10-31 (×3): qty 40

## 2020-10-31 MED ORDER — ALBUTEROL SULFATE HFA 108 (90 BASE) MCG/ACT IN AERS
1.0000 | INHALATION_SPRAY | Freq: Four times a day (QID) | RESPIRATORY_TRACT | Status: DC | PRN
  Filled 2020-10-31: qty 6.7

## 2020-10-31 MED ORDER — ACETAMINOPHEN 650 MG RE SUPP: 650.0000 mg | Freq: Four times a day (QID) | RECTAL | Status: DC | PRN

## 2020-10-31 MED ORDER — PANTOPRAZOLE SODIUM 40 MG IV SOLR
40.0000 mg | Freq: Once | INTRAVENOUS | Status: AC
  Administered 2020-10-31: 40 mg via INTRAVENOUS
  Filled 2020-10-31: qty 40

## 2020-10-31 MED ORDER — ACETAMINOPHEN 325 MG PO TABS
650.0000 mg | ORAL_TABLET | Freq: Four times a day (QID) | ORAL | Status: DC | PRN
  Administered 2020-11-01: 650 mg via ORAL
  Filled 2020-10-31: qty 2

## 2020-10-31 MED ORDER — LACTATED RINGERS IV BOLUS
500.0000 mL | Freq: Once | INTRAVENOUS | Status: AC
  Administered 2020-10-31: 500 mL via INTRAVENOUS

## 2020-10-31 MED ORDER — OXYCODONE-ACETAMINOPHEN 10-325 MG PO TABS: 1.0000 | ORAL_TABLET | Freq: Three times a day (TID) | ORAL | Status: DC | PRN

## 2020-10-31 MED ORDER — ONDANSETRON HCL 4 MG/2ML IJ SOLN
4.0000 mg | Freq: Once | INTRAMUSCULAR | Status: AC
  Administered 2020-10-31: 4 mg via INTRAVENOUS
  Filled 2020-10-31: qty 2

## 2020-10-31 MED ORDER — ACETAMINOPHEN 500 MG PO TABS
1000.0000 mg | ORAL_TABLET | Freq: Once | ORAL | Status: AC
  Administered 2020-10-31: 1000 mg via ORAL
  Filled 2020-10-31: qty 2

## 2020-10-31 MED ORDER — LEVETIRACETAM 250 MG PO TABS
250.0000 mg | ORAL_TABLET | Freq: Two times a day (BID) | ORAL | Status: DC
  Administered 2020-10-31 – 2020-11-02 (×4): 250 mg via ORAL
  Filled 2020-10-31 (×5): qty 1

## 2020-10-31 MED ORDER — OXYCODONE-ACETAMINOPHEN 5-325 MG PO TABS
1.0000 | ORAL_TABLET | Freq: Three times a day (TID) | ORAL | Status: DC | PRN
  Administered 2020-10-31 – 2020-11-02 (×4): 1 via ORAL
  Filled 2020-10-31 (×4): qty 1

## 2020-10-31 MED ORDER — ONDANSETRON HCL 4 MG PO TABS: 4.0000 mg | ORAL_TABLET | Freq: Four times a day (QID) | ORAL | Status: DC | PRN

## 2020-10-31 MED ORDER — OXYCODONE HCL 5 MG PO TABS
5.0000 mg | ORAL_TABLET | Freq: Three times a day (TID) | ORAL | Status: DC | PRN
  Administered 2020-11-01 – 2020-11-02 (×2): 5 mg via ORAL
  Filled 2020-10-31 (×2): qty 1

## 2020-10-31 MED ORDER — NORTRIPTYLINE HCL 25 MG PO CAPS
100.0000 mg | ORAL_CAPSULE | Freq: Every day | ORAL | Status: DC
  Administered 2020-10-31 – 2020-11-01 (×2): 100 mg via ORAL
  Filled 2020-10-31 (×3): qty 4

## 2020-10-31 NOTE — ED Provider Notes (Signed)
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  He currently takes clopidogrel because of prior cerebrovascular accidents.  He has had multiple CTs of his head and brain over the last year secondary to these neurologic problems.  He presents with rectal bleeding, describes as bright red blood, has reproducible tenderness in the left lower left mid and left upper abdomen without specific guarding or peritoneal signs.  Otherwise the patient has normal-appearing conjunctive a and mucous membranes.  Heart and lung exams are unremarkable, vital signs are reassuring.  Will need endoscopy and likely admit for rectal bleeding  I saw and evaluated the patient, reviewed the resident's note and I agree with the findings and plan.  I was personally present and directly supervised the following procedures:  Medical rescucitation Anoscopy (I was present for complete procedure) BRBPR present on anoscope  I personally interpreted the EKG as well as the resident and agree with the interpretation on the resident's chart.  Final diagnoses:  Rectal bleeding      Eber Hong, MD 11/02/20 1505

## 2020-10-31 NOTE — ED Triage Notes (Signed)
Pt BIB [facility] from home for bright red rectal bleeding starting today hx of the same.   VSS

## 2020-10-31 NOTE — ED Provider Notes (Signed)
[name] Provider Note   CSN: [id number] Arrival date & time: 10/31/20  1715     History Chief Complaint  Patient presents with  . Rectal Bleeding    Nicholas [name] is a 59 y.o. male presents with bright red blood per rectum that started yesterday.  Patient reports copious amounts of bright red blood whenever he is having a bowel movement.  Denies any diarrhea or constipation.  Does endorse lightheadedness associated with his rectal bleeding.  States he had this previously several years ago when he had ulcers.  Also reports that he had a partial gastrectomy due to ulcers [place] several years ago.  Denies any rectal pain.  Also endorses left lower quadrant abdominal pain that started about a week ago.  The history is provided by the patient.  Rectal Bleeding Quality:  Bright red Amount:  Copious Duration:  1 day Timing:  Intermittent Chronicity:  New Context: defecation   Context: not hemorrhoids and not rectal pain   Similar prior episodes: yes   Relieved by:  Nothing Worsened by:  Nothing Ineffective treatments:  None tried Associated symptoms: abdominal pain   Associated symptoms: no fever and no vomiting   Abdominal pain:    Location:  LLQ   Quality: burning     Severity:  Mild   Onset quality:  Gradual   Duration:  1 week   Timing:  Constant   Progression:  Worsening   Chronicity:  New Risk factors: anticoagulant use        Past Medical History:  Diagnosis Date  . Asthma   . Bipolar 1 disorder (HCC)   . Borderline glaucoma   . Chronic pain   . Epididymal pain    LEFT  . 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 Feeling of incomplete bladder emptying   . Frequency of urination   . Gastric ulcer   . GERD (gastroesophageal reflux disease)   . Hypertension   . Hyperthyroidism    NO MEDS   . Migraine   . Seizures (HCC)    . TIA (transient ischemic attack)   . Type 2 diabetes mellitus [facility](HCC)     Patient Active Problem List   Diagnosis Date Noted  . Cardiomyopathy (HCC) 09/23/2020  . Atypical chest pain 09/23/2020  . Precordial pain   . CVA (cerebral vascular accident) (HCC) 09/02/2020  . Altered mental status 07/23/2020  . Seizure (HCC) 07/22/2020  . Left-sided weakness 07/04/2020  . Dysarthria 07/04/2020  . Essential hypertension   . Seizure disorder (HCC) 07/02/2017  . Insulin-requiring or dependent type II diabetes mellitus (HCC) 07/02/2017  . CKD (chronic kidney disease), stage III (HCC) 07/02/2017  . Normocytic anemia 07/02/2017  . Testicular/scrotal pain 11/09/2013  . Microhematuria 11/09/2013  . Condyloma acuminatum of scrotum 11/09/2013    Past Surgical History:  Procedure Laterality Date  . ABDOMINAL SURGERY    . ANTERIOR CERVICAL DECOMP/DISCECTOMY FUSION  2007   C4  --  C6  . CERVICAL FUSION    . CHOLECYSTECTOMY    . CYSTOSCOPY N/A 11/09/2013   Procedure: CYSTOSCOPY FLEXIBLE;  Surgeon: Bjorn PippinJohn Wrenn, MD;  [facility];  Service: Urology;  Laterality: N/A;  . EPIDIDYMECTOMY Left 11/09/2013   Procedure:  LEFT EPIDIDYMECTOMY;  Surgeon: Bjorn PippinJohn Wrenn, MD;  [facility] [place];  Service: Urology;  Laterality:  Left;  POSSIBLE OUTPATIENT WITH OBSERVATION  . EXCISION LIPOMA LEFT SHOULDER  2004 (APPROX)  . MULTIPLE CYST REMOVED FROM CHEST  AGE 64  . OTHER SURGICAL HISTORY     hemorroid surgery   . TESTICLE REMOVAL Left   . TONSILLECTOMY         Family History  Problem Relation Age of Onset  . Heart failure Mother   . Diabetes Mother   . Hypertension Mother   . Cirrhosis Father   . Heart failure Father   . Kidney disease Father     Social History   Tobacco Use  . Smoking status: Former Smoker    Packs/day: 0.25    Years: 15.00    Pack years: 3.75    Types: Cigarettes    Quit date: 11/08/1992    Years since quitting: 27.9  . Smokeless  tobacco: Never Used  Vaping Use  . Vaping Use: Never used  Substance Use Topics  . Alcohol use: No  . Drug use: No    Home Medications Prior to Admission medications   Medication Sig Start Date End Date Taking? Authorizing Provider  acetaminophen (TYLENOL 8 HOUR) 650 MG CR tablet Take 1 tablet (650 mg total) by mouth every 8 (eight) hours. 05/09/19   Derwood Kaplan, MD  albuterol (PROVENTIL HFA;VENTOLIN HFA) 108 (90 Base) MCG/ACT inhaler Inhale 1-2 puffs into the lungs every 6 (six) hours as needed for wheezing or shortness of breath. 11/15/18   Rancour, Jeannett Senior, MD  atorvastatin (LIPITOR) 80 MG tablet Take 1 tablet (80 mg total) by mouth at bedtime. 09/05/20   Danford, Earl Lites, MD  budesonide-formoterol (SYMBICORT) 80-4.5 MCG/ACT inhaler Inhale 2 puffs into the lungs daily.    [provider]  clopidogrel (PLAVIX) 75 MG tablet Take 1 tablet (75 mg total) by mouth daily. 07/05/20   Azucena Fallen, MD  ergocalciferol (VITAMIN D2) 1.25 MG (50000 UT) capsule Take 50,000 Units by mouth every Monday.    [provider]  FARXIGA 10 MG TABS tablet Take 10 mg by mouth daily. 08/01/20   [provider]  fenofibrate (TRICOR) 145 MG tablet Take 145 mg by mouth daily. 10/24/19   [provider]  fluticasone-salmeterol (ADVAIR HFA) 115-21 MCG/ACT inhaler Inhale 2 puffs into the lungs daily.    [provider]  gabapentin (NEURONTIN) 300 MG capsule Take 900 mg by mouth at bedtime.    [provider]  HUMALOG KWIKPEN 200 UNIT/ML KwikPen Inject 0-10 Units into the skin 3 (three) times daily with meals. 07/05/20   Azucena Fallen, MD  latanoprost (XALATAN) 0.005 % ophthalmic solution Place 1 drop into both eyes at bedtime.  07/04/20   [provider]  levETIRAcetam (KEPPRA) 500 MG tablet Take 1 tablet (500 mg total) by mouth 2 (two) times daily. 750 mg twice daily for a month, then on 08/25/20 take 500 mg twice daily for a month then  09/24/20 take 250 mg twice daily for a month then stop 09/05/20   Danford, Earl Lites, MD  metFORMIN (GLUCOPHAGE) 1000 MG tablet Take 1 tablet (1,000 mg total) by mouth 2 (two) times daily. Patient taking differently: Take 500 mg by mouth daily with breakfast.  07/05/20   Azucena Fallen, MD  metoprolol tartrate (LOPRESSOR) 100 MG tablet Take 1 tablet (100 mg total) by mouth 2 (two) times daily. - Metoprolol tartarate 100 mg 2 hours before CT scan 10/21/20 01/19/21  Patwardhan, Anabel Bene, MD  nortriptyline (PAMELOR) 50 MG capsule Take 100  mg by mouth at bedtime.     [provider]  pantoprazole (PROTONIX) 40 MG tablet Take 40 mg by mouth 2 (two) times daily. 07/03/20   [provider]  polyethylene glycol powder (GLYCOLAX/MIRALAX) 17 GM/SCOOP powder Take 17 g by mouth daily as needed for mild constipation. 12/31/15   [provider]  rizatriptan (MAXALT) 10 MG tablet Take 10 mg by mouth daily. May repeat in 2 hours if needed     [provider]  sertraline (ZOLOFT) 50 MG tablet Take 50 mg by mouth daily. 06/13/20   [provider]  SUMAtriptan (IMITREX) 100 MG tablet Take 1 tablet (100 mg total) by mouth daily as needed for migraine or headache. May repeat in 2 hours if headache persists or recurs. 07/25/20   Leatha Gilding, MD  timolol (TIMOPTIC) 0.5 % ophthalmic solution Place 1 drop into both eyes 2 (two) times daily.  04/06/20   [provider]  valsartan (DIOVAN) 320 MG tablet Take 320 mg by mouth daily. 07/04/20   [provider]  zonisamide (ZONEGRAN) 100 MG capsule Take 200 mg by mouth 2 (two) times daily.  05/17/20   [provider]    Allergies    Levothyroxine, Nsaids, Amoxicillin, Ampicillin, Penicillins, Strawberry extract, Asa [aspirin], Bactrim [sulfamethoxazole-trimethoprim], Depakote [divalproex sodium], Dilantin [phenytoin], Methocarbamol, Risperidone and related, Sitagliptin, Strawberry (diagnostic), Sulfa  antibiotics, Tolmetin, Ultram [tramadol hcl], and Oatmeal  Review of Systems   Review of Systems  Constitutional: Negative for chills and fever.  HENT: Negative for ear pain and sore throat.   Eyes: Negative for pain and visual disturbance.  Respiratory: Negative for cough and shortness of breath.   Cardiovascular: Negative for chest pain and palpitations.  Gastrointestinal: Positive for abdominal pain, blood in stool, hematochezia and nausea. Negative for abdominal distention, constipation, diarrhea, rectal pain and vomiting.  Genitourinary: Negative for dysuria and hematuria.  Musculoskeletal: Negative for arthralgias and back pain.  Skin: Negative for color change and rash.  Neurological: Negative for seizures and syncope.  All other systems reviewed and are negative.   Physical Exam Updated Vital Signs There were no vitals taken for this visit.  Physical Exam Vitals and nursing note reviewed.  Constitutional:      General: He is not in acute distress.    Appearance: Normal appearance. He is well-developed. He is obese. He is not ill-appearing or toxic-appearing.  HENT:     Head: Normocephalic and atraumatic.  Eyes:     Conjunctiva/sclera: Conjunctivae normal.  Cardiovascular:     Rate and Rhythm: Normal rate and regular rhythm.     Heart sounds: No murmur heard.   Pulmonary:     Effort: Pulmonary effort is normal. No respiratory distress.     Breath sounds: Normal breath sounds.  Abdominal:     Palpations: Abdomen is soft.     Tenderness: There is abdominal tenderness (Pain worse on left side, no rebound or guarding. Patient with exaggerated response to palpation in multiple areas that are not consistent on repeat exam when talking/distracted).  Genitourinary:    Comments: Rectal exam and anoscope chaperoned/completed with Dr. Hyacinth Meeker at bedside Musculoskeletal:        General: No deformity or signs of injury.     Cervical back: Neck supple.  Skin:    General: Skin is  warm and dry.  Neurological:     Mental Status: He is alert and oriented to person, place, and time.     Sensory: Sensory deficit (LHB numbness  is chronic per patient) present.     Motor: Weakness (LHB weakness is chronic per patient) present.     ED Results / Procedures / Treatments   Labs (all labs ordered are listed, but only abnormal results are displayed) Labs Reviewed - No data to display  EKG None  Radiology No results found.  Procedures LOWER ENDOSCOPY  Date/Time: 10/31/2020 7:30 PM Performed by: Gershon Mussel, MD Authorized by: Eber Hong, MD  Indications: rectal bleeding  Sedation: Patient sedated: no  Scope type: anoscope External exam performed: yes Positive external exam findings: external hemorrhoids (multiple small) Digital exam performed: no Negative internal exam findings: no internal hemorrhoid, no diverticulosis, no intraluminal mass, no inflammation, no anal fissures, no anal fistulae, no anal stricture and no abscess Procedure termination: procedure complete Patient tolerance: patient tolerated the procedure well with no immediate complications Comments: BRBPR. No obvious bleeding hemorrhoids or other obvious source rectal bleeding     (including critical care time)  Medications Ordered in ED Medications - No data to display  ED Course  I have reviewed the triage vital signs and the nursing notes.  Pertinent labs & imaging results that were available during my care of the patient were reviewed by me and considered in my medical decision making (see chart for details).    MDM Rules/Calculators/A&P                          MDM: Nicholas [name] is a 59 y.o. male who presents with rectal bleeding as per above. I have reviewed the nursing documentation for past medical history, family history, and social history. Pertinent previous records reviewed. He is awake, alert. HDS. Afebrile. Physical exam is most notable for small external  hemorrhoids but no obvious bleeding internal hemorrhoids but patient does have BRBPR with anoscopy.  Labs: Hemoglobin stable at 13.5. Slight Cr elevation. EKG: NSR, left axis deviation. QTc, PR, and QRS within appropriate limits. No signs of acute ischemia, infarct, or significant electrical abnormalities. No STEMI, ST depressions, or significant T wave inversions. No evidence of a High-Grade Conduction Block, WPW, Brugada Sign, ARVC, DeWinters T Waves, or Wellens Waves. Consults: Message sent to GI who can see the patient in the morning Tx: 40 mg IV Protonix, 4 mg IV Zofran, 500 cc LR.  Patient declined 1000 mg p.o. Tylenol.  Differential Dx: I am most concerned for active GI bleed on Plavix likely secondary to ulceration given patient's history of previous ulcers. Given history, physical exam, and work-up, I do not think he has aortoenteric fistula, dieulafoy lesion, diverticulitis, appendicitis, intra-abdominal abscess, or trauma.  MDM: Nicholas [name] is a 59 y.o. male presents with rectal bleeding.  Bright red blood per rectum seen on endoscope no obvious sources of bleeding.  Patient is on Plavix and will need to be admitted for further management of his acute GI bleed that is not hemorrhoidal and as he is on anticoagulation.  Patient requesting pain medication.  Patient overall in no acute distress and has inconsistent abdominal exam and exaggerated response to palpation in various places.  On reassessment, patient laughing while talking some on the phone.  There is a chart notification cautioning drug-seeking behavior in this patient which is consistent with patient's request today.  Patient given Tylenol but refused requesting stronger medication.  Given history and exam, do not feel that narcotic pain medication is needed at this time.  Hospitalist consulted for admission, handoff given.  Admitted stable condition.  The plan for this patient was discussed with Dr. Hyacinth Meeker, who voiced agreement  and who oversaw evaluation and treatment of this patient.   Final Clinical Impression(s) / ED Diagnoses Final diagnoses:  None    Rx / DC Orders ED Discharge Orders    None       Denson Niccoli, MD 10/31/20 2345    Eber Hong, MD 11/02/20 1505

## 2020-10-31 NOTE — H&P (Signed)
TRH H&P    Patient Demographics:    Josafat Enrico, is a 59 y.o. male  MRN: [id number]  DOB - 27-May-1961  Admit Date - 10/31/2020  Referring MD/NP/PA: Sharin Mons  Outpatient Primary MD for the patient is Patient, No Pcp Per  Patient coming from: Home  Chief complaint- Bright red blood per rectum   HPI:    Samir Ishaq  is a 59 y.o. male, with history of diabetes mellitus type 2, TIAs, hypothyroidism, hypertension, GERD, bipolar 1 disorder, opiate seeking behavior, and more presents to the ER with a chief complaint of bright red blood per rectum.  Patient is a very poor historian.  He reports that he had a partial gastrectomy with GI at another facility some years ago.  He reports that he had follow-up with his GI 2 weeks ago.  The GI reportedly did an image and told the patient that he had some stool burden so he was prescribed laxatives.  The only imaging I can find in care everywhere is a CT abdomen of pelvis with contrast from October 15 and it showed a mild prominence of wall thickness of small bowel loops throughout the abdomen which is nonspecific but subtle changes of an underlying enteritis may be considered, and mild hepatic steatosis.  Patient reports that he was fine for a week, and then he started having a taste of blood in his mouth.  This was concerning to him so he drank Epson salts to clear himself out.  Yesterday he started having left lower quadrant stomach cramps, and urgently had to go to the bathroom.  Patient reports that he wears depends and he went through for depends in 1 hour due to bloody bowel movements.  He reports bright red blood with no maroon color no clots.  He reports no stool mixed in.  The bleeding stopped after an hour but the stomach cramps continued.  He reports today he had generalized weakness, lightheadedness, and bright red blood per rectum returned.  On further questioning patient  reports that the lightheadedness he experienced was secondary to the odor of the stool.  He reports bleeding and gushes, not dribbles.  His stomach cramps worsened from a crampy feeling to a sharp, dull, aching, twisting severe pain.  Patient reports that he has had 2 episodes of nausea and vomiting.  He reports that his emesis is liquid and appears red as well.  Patient's last normal bowel movement was on Monday.  Patient has had a normal appetite throughout the whole episode.  Review of systems he does report shortness of breath and dysuria that have been going on for months and he is currently in outpatient work-up for each.  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  Patient is a Optician, dispensing.  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  Patient reports he is never been a drinker.  He does not use heroin.  Patient is full code.  In  the ED Temperature 98.1, heart rate 73-86, respiratory rate 16-25, blood pressure 123/85, satting at 95% on room air White blood cell count is 6.8, hemoglobin stable at 13.5 Chemistry panel reveals an AKI with a baseline creatinine of 1.33 and creatinine today 1.68 Is also mild hyperglycemia at 167 Patient was typed and screened with a positive blood EKG showed a heart rate of 87, sinus rhythm, QTC 456, no ischemic changes He was given Tylenol, Zofran, LR 500 mL, Protonix 40 IV, and kept n.p.o. GI was contacted and reports that they will see him in the morning. Anoscopy was done that showed internal hemorrhoids and external hemorrhoids that are nonbleeding, but there was blood in the rectum.    Review of systems:    In addition to the HPI above,  No Fever-chills, No Headache, No changes with Vision or hearing, No problems swallowing food or Liquids, Reports chronic chest pain and shortness of breath that is currently being worked up outpatient-no  changes from his baseline Patient reports abdominal pain, nausea, vomiting, loose stools Bright red blood per rectum, no hematuria No dysuria, No new skin rashes or bruises, No new joints pains-aches,  No new weakness, tingling, numbness in any extremity, No recent weight gain or loss, No polyuria, polydypsia or polyphagia, No significant Mental Stressors.  All other systems reviewed and are negative.    Past History of the following :    Past Medical History:  Diagnosis Date  . Asthma   . Bipolar 1 disorder (HCC)   . Borderline glaucoma   . Chronic pain   . Epididymal pain    LEFT  . 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 Feeling of incomplete bladder emptying   . Frequency of urination   . Gastric ulcer   . GERD (gastroesophageal reflux disease)   . Hypertension   . Hyperthyroidism    NO MEDS   . Migraine   . Seizures (HCC)   . TIA (transient ischemic attack)   . Type 2 diabetes mellitus (HCC)       Past Surgical History:  Procedure Laterality Date  . ABDOMINAL SURGERY    . ANTERIOR CERVICAL DECOMP/DISCECTOMY FUSION  2007   C4  --  C6  . CERVICAL FUSION    . CHOLECYSTECTOMY    . CYSTOSCOPY N/A 11/09/2013   Procedure: CYSTOSCOPY FLEXIBLE;  Surgeon: Bjorn Pippin, MD;  Location: [facility];  Service: Urology;  Laterality: N/A;  . EPIDIDYMECTOMY Left 11/09/2013   Procedure:  LEFT EPIDIDYMECTOMY;  Surgeon: Bjorn Pippin, MD;  Location: [facility];  Service: Urology;  Laterality: Left;  POSSIBLE OUTPATIENT WITH OBSERVATION  . EXCISION LIPOMA LEFT SHOULDER  2004 (APPROX)  . MULTIPLE CYST REMOVED FROM CHEST  AGE 28  . OTHER SURGICAL HISTORY     hemorroid surgery   . TESTICLE REMOVAL Left   . TONSILLECTOMY        Social History:      Social History   Tobacco Use  . Smoking status: Former Smoker    Packs/day: 0.25    Years: 15.00    Pack  years: 3.75    Types: Cigarettes    Quit date: 11/08/1992    Years since quitting: 27.9  . Smokeless tobacco: Never Used  Substance Use Topics  . Alcohol use: No       Family History :  Family History  Problem Relation Age of Onset  . Heart failure Mother   . Diabetes Mother   . Hypertension Mother   . Cirrhosis Father   . Heart failure Father   . Kidney disease Father       Home Medications:   Prior to Admission medications   Medication Sig Start Date End Date Taking? Authorizing Provider  atorvastatin (LIPITOR) 80 MG tablet Take 1 tablet (80 mg total) by mouth at bedtime. Patient taking differently: Take 80 mg by mouth daily.  09/05/20  Yes Danford, Earl Lites, MD  clopidogrel (PLAVIX) 75 MG tablet Take 1 tablet (75 mg total) by mouth daily. 07/05/20  Yes Azucena Fallen, MD  gabapentin (NEURONTIN) 600 MG tablet Take 600-1,200 mg by mouth See admin instructions. Take 600 mg by mouth in the morning and 1,200 mg at bedtime   Yes [provider]  HUMALOG KWIKPEN 200 UNIT/ML KwikPen Inject 0-10 Units into the skin 3 (three) times daily with meals. Patient taking differently: Inject into the skin See admin instructions. Inject into the skin up to 4 times a day, per sliding scale: BGL 160 or greater = 10 units; 200 or greater = 14 units 07/05/20  Yes Azucena Fallen, MD  insulin lispro (HUMALOG KWIKPEN) 100 UNIT/ML KwikPen Inject into the skin.   Yes [provider]  latanoprost (XALATAN) 0.005 % ophthalmic solution Place 1 drop into both eyes at bedtime.  07/04/20  Yes [provider]  levETIRAcetam (KEPPRA) 250 MG tablet Take 250 mg by mouth in the morning and at bedtime.   Yes [provider]  oxyCODONE-acetaminophen (PERCOCET) 10-325 MG tablet Take 1-2 tablets by mouth 4 (four) times daily. 10/24/20  Yes [provider]  zonisamide (ZONEGRAN) 100 MG capsule Take 200 mg by mouth at bedtime.  05/17/20  Yes [provider]  acetaminophen (TYLENOL 8 HOUR) 650 MG CR tablet Take 1 tablet (650 mg total) by mouth every 8 (eight) hours. 05/09/19   Derwood Kaplan, MD  albuterol (PROVENTIL HFA;VENTOLIN HFA) 108 (90 Base) MCG/ACT inhaler Inhale 1-2 puffs into the lungs every 6 (six) hours as needed for wheezing or shortness of breath. 11/15/18   Rancour, Jeannett Senior, MD  budesonide-formoterol (SYMBICORT) 80-4.5 MCG/ACT inhaler Inhale 2 puffs into the lungs daily.    [provider]  ergocalciferol (VITAMIN D2) 1.25 MG (50000 UT) capsule Take 50,000 Units by mouth every Monday.    [provider]  fenofibrate (TRICOR) 145 MG tablet Take 145 mg by mouth daily. 10/24/19   [provider]  fluticasone-salmeterol (ADVAIR HFA) 115-21 MCG/ACT inhaler Inhale 2 puffs into the lungs daily.    [provider]  levETIRAcetam (KEPPRA) 500 MG tablet Take 1 tablet (500 mg total) by mouth 2 (two) times daily. 750 mg twice daily for a month, then on 08/25/20 take 500 mg twice daily for a month then 09/24/20 take 250 mg twice daily for a month then stop Patient not taking: Reported on 10/31/2020 09/05/20   Alberteen Sam, MD  metFORMIN (GLUCOPHAGE) 1000 MG tablet Take 1 tablet (1,000 mg total) by mouth 2 (two) times daily. Patient taking differently: Take 1,000 mg by mouth 2 (two) times daily with a meal.  07/05/20   Azucena Fallen, MD  metoprolol tartrate (LOPRESSOR) 100 MG tablet Take 1 tablet (100 mg total) by mouth 2 (two) times daily. - Metoprolol tartarate 100 mg 2 hours before CT scan 10/21/20 01/19/21  Patwardhan, Anabel Bene, MD  nortriptyline (PAMELOR)  50 MG capsule Take 100 mg by mouth at bedtime.     [provider]  pantoprazole (PROTONIX) 40 MG tablet Take 40 mg by mouth 2 (two) times daily. 07/03/20   [provider]  polyethylene glycol powder (GLYCOLAX/MIRALAX) 17 GM/SCOOP powder Take 17 g by mouth daily as needed for mild constipation. 12/31/15   [provider]  rizatriptan (MAXALT) 10 MG tablet Take 10 mg by mouth daily. May repeat in 2 hours if needed     [provider]  sertraline (ZOLOFT) 50 MG tablet Take 50 mg by mouth daily. 06/13/20   [provider]  SUMAtriptan (IMITREX) 100 MG tablet Take 1 tablet (100 mg total) by mouth daily as needed for migraine or headache. May repeat in 2 hours if headache persists or recurs. 07/25/20   Leatha GildingGherghe, Costin M, MD  timolol (TIMOPTIC) 0.5 % ophthalmic solution Place 1 drop into both eyes 2 (two) times daily.  04/06/20   [provider]  valsartan (DIOVAN) 320 MG tablet Take 320 mg by mouth daily. 07/04/20   [provider]     Allergies:     Allergies  Allergen Reactions  . Levothyroxine Anaphylaxis and Cough    Chronic cough  . Nsaids Other (See Comments)    D/t gastric ulcer  . Amoxicillin Itching and Other (See Comments)    THRUSH Causes sores in mouth  . Ampicillin Other (See Comments)    THRUSH  . Penicillins Itching and Other (See Comments)    THRUSH- Causes sores in mouth  . Strawberry Extract Swelling    LIPS SWELL  . Asa [Aspirin] Other (See Comments)    Bleeding   . Bactrim [Sulfamethoxazole-Trimethoprim] Hives, Itching and Other (See Comments)    GI upset  . Dapagliflozin     Other reaction(s): Other (See Comments) Allergic to steroids and had mouth broke out.   . Depakote [Divalproex Sodium]     Causes double vision and speech problems   . Dilantin [Phenytoin] Other (See Comments)    Severe skin peeling  . Methocarbamol Other (See Comments)    dizziness  . Other     Med for prostate that startes with a M- Broke out the insid eof mouth, split the tongue, and caused throudh  . Risperidone And Related Other (See Comments)    Hallucinations   . Sitagliptin Other (See Comments)    pancreatitis  . Strawberry (Diagnostic) Itching and Swelling  . Sulfa Antibiotics Other (See Comments)    Affects thyroid   . Tolmetin Other (See  Comments)    D/t gastric ulcer  . Ultram [Tramadol Hcl] Other (See Comments)    D/t gastric ulcer  . Buprenorphine Itching, Rash and Other (See Comments)    "Patches broke me out in red patches-  caused a fever, also"  . Oatmeal Hives and Other (See Comments)    White spots/sores in mouth     Physical Exam:   Vitals  Blood pressure 127/75, pulse 76, temperature 98.1 F (36.7 C), temperature source Oral, resp. rate 20, SpO2 96 %.  1.  General: Patient lying supine in bed no acute distress  2. Psychiatric: Opiate seeking behavior, cooperative with exam, irritable, alert and oriented x3  3. Neurologic: Cranial nerves II through XII are grossly intact, weakness in left lower extremity compared to right lower extremity-chronic, but still 5 out of 5 strength, moves all 4 extremities voluntarily, speech and language are normal  4. HEENMT:  Head is atraumatic, normocephalic,  pupils reactive to light, neck is supple, trachea is midline, mucous membranes are moist  5. Respiratory : Lungs are clear to auscultation bilaterally, no rhonchi, wheezes, crackles  6. Cardiovascular : Heart rate is normal, rhythm is regular, no murmurs rubs or gallops  7. Gastrointestinal:  Tenderness reported in the left lower quadrant, improved with distraction of patient, no distention, no masses palpated  8. Skin:  Skin is warm dry and intact, no acute lesions on limited exam  9.Musculoskeletal:  No peripheral edema, peripheral pulses palpated, no calf tenderness, no acute deformity    Data Review:    CBC Recent Labs  Lab 10/31/20 1802  WBC 6.8  HGB 13.5  HCT 43.1  PLT 173  MCV 88.0  MCH 27.6  MCHC 31.3  RDW 15.0   ------------------------------------------------------------------------------------------------------------------  Results for orders placed or performed during the hospital encounter of 10/31/20 (from the past 48 hour(s))  Comprehensive metabolic panel     Status: Abnormal    Collection Time: 10/31/20  6:02 PM  Result Value Ref Range   Sodium 139 135 - 145 mmol/L   Potassium 4.3 3.5 - 5.1 mmol/L   Chloride 106 98 - 111 mmol/L   CO2 22 22 - 32 mmol/L   Glucose, Bld 167 (H) 70 - 99 mg/dL    Comment: Glucose reference range applies only to samples taken after fasting for at least 8 hours.   BUN 19 6 - 20 mg/dL   Creatinine, Ser 6.29 (H) 0.61 - 1.24 mg/dL   Calcium 9.8 8.9 - 47.6 mg/dL   Total Protein 7.2 6.5 - 8.1 g/dL   Albumin 4.4 3.5 - 5.0 g/dL   AST 33 15 - 41 U/L   ALT 40 0 - 44 U/L   Alkaline Phosphatase 69 38 - 126 U/L   Total Bilirubin 0.4 0.3 - 1.2 mg/dL   GFR, Estimated 47 (L) >60 mL/min    Comment: (NOTE) Calculated using the CKD-EPI Creatinine Equation (2021)    Anion gap 11 5 - 15    Comment: Performed at [facility], [address], [place], [place] [address]  CBC     Status: None   Collection Time: 10/31/20  6:02 PM  Result Value Ref Range   WBC 6.8 4.0 - 10.5 K/uL   RBC 4.90 4.22 - 5.81 MIL/uL   Hemoglobin 13.5 13.0 - 17.0 g/dL   HCT 35.4 39 - 52 %   MCV 88.0 80.0 - 100.0 fL   MCH 27.6 26.0 - 34.0 pg   MCHC 31.3 30.0 - 36.0 g/dL   RDW 65.6 81.2 - 75.1 %   Platelets 173 150 - 400 K/uL   nRBC 0.0 0.0 - 0.2 %    Comment: Performed at [facility], [address] [place], [place] [address]  Type and screen [name]     Status: None   Collection Time: 10/31/20  6:05 PM  Result Value Ref Range   ABO/RH(D) A POS    Antibody Screen NEG    Sample Expiration      11/03/2020,2359 Performed at [facility], [address], [place], [place] [address]     Chemistries  Recent Labs  Lab 10/31/20 1802  NA 139  K 4.3  CL 106  CO2 22  GLUCOSE 167*  BUN 19  CREATININE 1.68*  CALCIUM 9.8  AST 33  ALT 40  ALKPHOS 69  BILITOT 0.4   ------------------------------------------------------------------------------------------------------------------   ------------------------------------------------------------------------------------------------------------------ GFR: CrCl  cannot be calculated (Unknown ideal weight.). Liver Function Tests: Recent Labs  Lab 10/31/20 1802  AST 33  ALT 40  ALKPHOS 69  BILITOT 0.4  PROT 7.2  ALBUMIN 4.4   No results for input(s): LIPASE, AMYLASE in the last 168 hours. No results for input(s): AMMONIA in the last 168 hours. Coagulation Profile: No results for input(s): INR, PROTIME in the last 168 hours. Cardiac Enzymes: No results for input(s): CKTOTAL, CKMB, CKMBINDEX, TROPONINI in the last 168 hours. BNP (last 3 results) No results for input(s): PROBNP in the last 8760 hours. HbA1C: No results for input(s): HGBA1C in the last 72 hours. CBG: No results for input(s): GLUCAP in the last 168 hours. Lipid Profile: No results for input(s): CHOL, HDL, LDLCALC, TRIG, CHOLHDL, LDLDIRECT in the last 72 hours. Thyroid Function Tests: No results for input(s): TSH, T4TOTAL, FREET4, T3FREE, THYROIDAB in the last 72 hours. Anemia Panel: No results for input(s): VITAMINB12, FOLATE, FERRITIN, TIBC, IRON, RETICCTPCT in the last 72 hours.  --------------------------------------------------------------------------------------------------------------- Urine analysis:    Component Value Date/Time   COLORURINE STRAW (A) 09/03/2020 1722   APPEARANCEUR CLEAR 09/03/2020 1722   LABSPEC 1.008 09/03/2020 1722   PHURINE 5.0 09/03/2020 1722   GLUCOSEU >=500 (A) 09/03/2020 1722   HGBUR NEGATIVE 09/03/2020 1722   BILIRUBINUR NEGATIVE 09/03/2020 1722   KETONESUR NEGATIVE 09/03/2020 1722   PROTEINUR NEGATIVE 09/03/2020 1722   UROBILINOGEN 0.2 08/29/2015 1125   NITRITE NEGATIVE 09/03/2020 1722   LEUKOCYTESUR NEGATIVE 09/03/2020 1722      Imaging Results:    No results found.     Assessment & Plan:    Principal Problem:   Rectal bleeding Active Problems:   Insulin-requiring or dependent type II diabetes  mellitus (HCC)   Essential hypertension   AKI (acute kidney injury) (HCC)   1. Rectal bleeding 1. Bright red blood per rectum x2 days 2. Hemoglobin stable at 13.5 3. Check CBC every 8 hours 4. Protonix 40 given in the ED 5. Continue IV Protonix 40 mg twice daily 6. N.p.o. 7. GI consulted -appreciate their recs 8. Continue Zofran for nausea 9. Pain control with his home pain medications of Tylenol and Percocet 10. Holding Plavix 11. Continue to monitor 2. Mild AKI 1. Baseline 1.33, today creatinine 1.68 2. Holding ARB 3. Continue IV fluids 4. Trend in the a.m. 3. Diabetes mellitus type 2 1. Sliding scale coverage 2. N.p.o. at this time 3. Last hemoglobin A1c was less than 3 months ago and was 7.1 4. Hypertension 1. Hold ARB 2. Continue Lopressor 3. Continue to monitor 5.    DVT Prophylaxis-   SCDs  AM Labs Ordered, also please review Full Orders  Family Communication: Admission, patients condition and plan of care including tests being ordered have been discussed with the patient and fianc on speaker phone who indicate understanding and agree with the plan and Code Status.  Code Status: Full  Admission status: Observation Time spent in minutes : 65   Azaleah Usman B Zierle-Ghosh DO

## 2020-10-31 NOTE — ED Notes (Signed)
Pt said he can not sit or stand for orthostatic vitals because of pain.

## 2020-11-01 ENCOUNTER — Encounter (HOSPITAL_COMMUNITY): Payer: Self-pay | Admitting: Family Medicine

## 2020-11-01 DIAGNOSIS — K625 Hemorrhage of anus and rectum: Secondary | ICD-10-CM | POA: Diagnosis not present

## 2020-11-01 LAB — CBC
HCT: 43.1 % (ref 39.0–52.0)
HCT: 42.3 % (ref 39.0–52.0)
HCT: 44.8 % (ref 39.0–52.0)
Hemoglobin: 13.5 g/dL (ref 13.0–17.0)
Hemoglobin: 13.4 g/dL (ref 13.0–17.0)
Hemoglobin: 13.6 g/dL (ref 13.0–17.0)
MCH: 27.4 pg (ref 26.0–34.0)
MCH: 27.6 pg (ref 26.0–34.0)
MCH: 27.1 pg (ref 26.0–34.0)
MCHC: 31.3 g/dL (ref 30.0–36.0)
MCHC: 31.7 g/dL (ref 30.0–36.0)
MCHC: 30.4 g/dL (ref 30.0–36.0)
MCV: 87.6 fL (ref 80.0–100.0)
MCV: 87.2 fL (ref 80.0–100.0)
MCV: 89.4 fL (ref 80.0–100.0)
Platelets: 151 10*3/uL (ref 150–400)
Platelets: 185 10*3/uL (ref 150–400)
Platelets: 158 10*3/uL (ref 150–400)
RBC: 4.92 MIL/uL (ref 4.22–5.81)
RBC: 4.85 MIL/uL (ref 4.22–5.81)
RBC: 5.01 MIL/uL (ref 4.22–5.81)
RDW: 15.1 % (ref 11.5–15.5)
RDW: 14.9 % (ref 11.5–15.5)
RDW: 15 % (ref 11.5–15.5)
WBC: 5.7 10*3/uL (ref 4.0–10.5)
WBC: 5.2 10*3/uL (ref 4.0–10.5)
WBC: 5.3 10*3/uL (ref 4.0–10.5)
nRBC: 0 % (ref 0.0–0.2)
nRBC: 0 % (ref 0.0–0.2)
nRBC: 0 % (ref 0.0–0.2)

## 2020-11-01 LAB — GLUCOSE, CAPILLARY
Glucose-Capillary: 148 mg/dL — ABNORMAL HIGH (ref 70–99)
Glucose-Capillary: 126 mg/dL — ABNORMAL HIGH (ref 70–99)
Glucose-Capillary: 138 mg/dL — ABNORMAL HIGH (ref 70–99)
Glucose-Capillary: 106 mg/dL — ABNORMAL HIGH (ref 70–99)

## 2020-11-01 LAB — COMPREHENSIVE METABOLIC PANEL
ALT: 38 U/L (ref 0–44)
AST: 28 U/L (ref 15–41)
Albumin: 3.9 g/dL (ref 3.5–5.0)
Alkaline Phosphatase: 64 U/L (ref 38–126)
Anion gap: 14 (ref 5–15)
BUN: 19 mg/dL (ref 6–20)
CO2: 20 mmol/L — ABNORMAL LOW (ref 22–32)
Calcium: 9.8 mg/dL (ref 8.9–10.3)
Chloride: 108 mmol/L (ref 98–111)
Creatinine, Ser: 1.58 mg/dL — ABNORMAL HIGH (ref 0.61–1.24)
GFR, Estimated: 50 mL/min — ABNORMAL LOW (ref >60)
Glucose, Bld: 141 mg/dL — ABNORMAL HIGH (ref 70–99)
Potassium: 4.5 mmol/L (ref 3.5–5.1)
Sodium: 142 mmol/L (ref 135–145)
Total Bilirubin: 0.4 mg/dL (ref 0.3–1.2)
Total Protein: 6.6 g/dL (ref 6.5–8.1)

## 2020-11-01 LAB — ABO/RH: ABO/RH(D): A POS

## 2020-11-01 MED ORDER — CHLORHEXIDINE GLUCONATE CLOTH 2 % EX PADS
6.0000 | MEDICATED_PAD | Freq: Every day | CUTANEOUS | Status: DC
  Administered 2020-11-01 – 2020-11-02 (×2): 6 via TOPICAL

## 2020-11-01 MED ORDER — PEG 3350-KCL-NA BICARB-NACL 420 G PO SOLR
4000.0000 mL | Freq: Once | ORAL | Status: AC
  Administered 2020-11-01: 4000 mL via ORAL
  Filled 2020-11-01: qty 4000

## 2020-11-01 MED ORDER — SODIUM CHLORIDE 0.9 % IV SOLN: INTRAVENOUS | Status: DC

## 2020-11-01 MED ORDER — ORAL CARE MOUTH RINSE
15.0000 mL | Freq: Two times a day (BID) | OROMUCOSAL | Status: DC
  Administered 2020-11-02: 15 mL via OROMUCOSAL

## 2020-11-01 MED ORDER — CHLORHEXIDINE GLUCONATE 0.12 % MT SOLN
15.0000 mL | Freq: Two times a day (BID) | OROMUCOSAL | Status: DC
  Administered 2020-11-01 – 2020-11-02 (×2): 15 mL via OROMUCOSAL
  Filled 2020-11-01 (×2): qty 15

## 2020-11-01 NOTE — Progress Notes (Signed)
PROGRESS NOTE  UTAH DELAUDER VPX:[id number] DOB: December 04, 1961 DOA: 10/31/2020 PCP: Patient, No Pcp Per  Brief History   Adonte Vanriper  is a 59 y.o. male, with history of diabetes mellitus type 2, TIAs, hypothyroidism, hypertension, GERD, bipolar 1 disorder, opiate seeking behavior, and more presents to the ER with a chief complaint of bright red blood per rectum.  Patient is a very poor historian.  He reports that he had a partial gastrectomy with GI at another facility some years ago.  He reports that he had follow-up with his GI 2 weeks ago.  The GI reportedly did an image and told the patient that he had some stool burden so he was prescribed laxatives.  The only imaging I can find in care everywhere is a CT abdomen of pelvis with contrast from October 15 and it showed a mild prominence of wall thickness of small bowel loops throughout the abdomen which is nonspecific but subtle changes of an underlying enteritis may be considered, and mild hepatic steatosis.  Patient reports that he was fine for a week, and then he started having a taste of blood in his mouth.  This was concerning to him so he drank Epson salts to clear himself out.  Yesterday he started having left lower quadrant stomach cramps, and urgently had to go to the bathroom.  Patient reports that he wears depends and he went through for depends in 1 hour due to bloody bowel movements.  He reports bright red blood with no maroon color no clots.  He reports no stool mixed in.  The bleeding stopped after an hour but the stomach cramps continued.  He reports today he had generalized weakness, lightheadedness, and bright red blood per rectum returned.  On further questioning patient reports that the lightheadedness he experienced was secondary to the odor of the stool.  He reports bleeding and gushes, not dribbles.  His stomach cramps worsened from a crampy feeling to a sharp, dull, aching, twisting severe pain.  Patient reports that he has had 2  episodes of nausea and vomiting.  He reports that his emesis is liquid and appears red as well.  Patient's last normal bowel movement was on Monday.  Patient has had a normal appetite throughout the whole episode.  Review of systems he does report shortness of breath and dysuria that have been going on for months and he is currently in outpatient work-up for each.  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  Patient is a Optician, dispensing.  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  Patient reports he is never been a drinker.  He does not use heroin.  Patient is full code.  [facility] have been consulted to admit the patient for further evaluation and care. Gastroenterology was consulted from the ED. Hemoglobin is stable in the 13.4 - 13.6 range.  Consultants  . Gastroenterology  Procedures  . None  Antibiotics   Anti-infectives (From admission, onward)   None    .  Subjective  The patient is resting comfortably. No new complaints.  Objective   Vitals:  Vitals:   11/01/20 1003 11/01/20 1430  BP: 121/84 106/75  Pulse: 69 (!) 58  Resp: 18 18  Temp: (!) 97.4 F (36.3 C) (!) 97.4 F (36.3 C)  SpO2: 92% 98%   Exam:  Constitutional:  . The patient is awake, alert, and  oriented x 3. No acute distress. Respiratory:  . No increased work of breathing. . No wheezes, rales, or rhonchi . No tactile fremitus Cardiovascular:  . Regular rate and rhythm . No murmurs, ectopy, or gallups. . No lateral PMI. No thrills. Abdomen:  . Abdomen is soft, non-tender, non-distended . No hernias, masses, or organomegaly . Normoactive bowel sounds.  Musculoskeletal:  . No cyanosis, clubbing, or edema Skin:  . No rashes, lesions, ulcers . palpation of skin: no induration or nodules Neurologic:  . CN 2-12 intact . Sensation all 4 extremities intact Psychiatric:  . Mental  status o Mood, affect appropriate o Orientation to person, place, time  . judgment and insight appear intact  I have personally reviewed the following:   Today's Data  . Vitals, CBC, CMP  Scheduled Meds: . atorvastatin  80 mg Oral Daily  . chlorhexidine  15 mL Mouth Rinse BID  . Chlorhexidine Gluconate Cloth  6 each Topical Daily  . fenofibrate  160 mg Oral Daily  . gabapentin  1,200 mg Oral QHS  . gabapentin  600 mg Oral Daily  . insulin aspart  0-15 Units Subcutaneous TID WC  . levETIRAcetam  250 mg Oral BID  . mouth rinse  15 mL Mouth Rinse q12n4p  . metoprolol tartrate  100 mg Oral BID  . mometasone-formoterol  2 puff Inhalation BID  . nortriptyline  100 mg Oral QHS  . pantoprazole (PROTONIX) IV  40 mg Intravenous Q12H  . polyethylene glycol-electrolytes  4,000 mL Oral Once  . sertraline  50 mg Oral Daily   Continuous Infusions: . sodium chloride 75 mL/hr at 11/01/20 1400  . sodium chloride      Principal Problem:   Rectal bleeding Active Problems:   Insulin-requiring or dependent type II diabetes mellitus (HCC)   Essential hypertension   AKI (acute kidney injury) (HCC)   LOS: 0 days   A & P  Rectal bleeding: The patient has been having bright red blood per rectum x2 days. However, his hemoglobin has been stable at 13.5. Monitor hemoglobin. Continue Protonix. Will be kept NPO after midnight. GI to perform EGD and colonoscopy tomorrow afternoon. Plavix is being held.   Mild AKI: Baseline creatinine was 1.33.  Today creatinine was 1.68. Avoid nephrotoxins hypotension. Monitor creatinine and electrolytes and volume status. Continue IV fluids.  Diabetes mellitus type 2: Glucoses will be monitored with FSBS and SSI. Last hemoglobin A1c was less than 3 months ago and was 7.1. The patient is NPO at this time.   Hypertension: Blood pressures are elevated. He has been continued on lopressor. Monitor.   Chronic pain: Patient is narcotic seeking per H&P. Caution with  narcotics.  I have seen and examined this patient myself. I have spent 34 minutes in his evaluation and care.  DVT Prophylaxis: SCDs CODE STATUS: Full Code Family Communication: None available. Status is: Observation  The patient remains OBS appropriate and will d/c before 2 midnights.  Dispo: The patient is from: Home              Anticipated d/c is to: Home              Anticipated d/c date is: 1 day              Patient currently is not medically stable to d/c.  Sigurd Pugh, DO [facility] Direct contact: see [email or web address]  7PM-7AM contact night coverage as above 11/01/2020, 5:51 PM  LOS: 0 days

## 2020-11-01 NOTE — H&P (View-Only) (Signed)
[facility] Consultation Note  Referring Provider: [facility] Primary Care Physician:  Patient, No Pcp Per Primary Gastroenterologist:  Dr. Annita Brod (surgeon/GI in [place], [place])  Reason for Consultation:  hematochezia  HPI: Nicholas [name] is a 59 y.o. male admitted for hematochezia.  Has history of gastric ulcers, and had some type of gastric surgery few years ago for that.  Starting yesterday, patient had some generalized lower abdominal discomfort as well as some bright red hematochezia.  No further bleeding since last night.  Reports having some type of intervention done on his anal sphincter (?) and has some bowel incontinence from time-to-time.  Last colonoscopy per patient about 6 years ago by Dr. Micah Noel, and was told to have repeat examination within 5 years.   Past Medical History:  Diagnosis Date  . Asthma   . Bipolar 1 disorder (HCC)   . Borderline glaucoma   . Chronic pain   . Epididymal pain    LEFT  . 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 Feeling of incomplete bladder emptying   . Frequency of urination   . Gastric ulcer   . GERD (gastroesophageal reflux disease)   . Hypertension   . Hyperthyroidism    NO MEDS   . Migraine   . Seizures (HCC)   . TIA (transient ischemic attack)   . Type 2 diabetes mellitus (HCC)     Past Surgical History:  Procedure Laterality Date  . ABDOMINAL SURGERY    . ANTERIOR CERVICAL DECOMP/DISCECTOMY FUSION  2007   C4  --  C6  . CERVICAL FUSION    . CHOLECYSTECTOMY    . CYSTOSCOPY N/A 11/09/2013   Procedure: CYSTOSCOPY FLEXIBLE;  Surgeon: Bjorn Pippin, MD;  Location: [facility];  Service: Urology;  Laterality: N/A;  . EPIDIDYMECTOMY Left 11/09/2013   Procedure:  LEFT EPIDIDYMECTOMY;  Surgeon: Bjorn Pippin, MD;  Location: [facility];  Service: Urology;  Laterality: Left;  POSSIBLE OUTPATIENT  WITH OBSERVATION  . EXCISION LIPOMA LEFT SHOULDER  2004 (APPROX)  . MULTIPLE CYST REMOVED FROM CHEST  AGE 32  . OTHER SURGICAL HISTORY     hemorroid surgery   . TESTICLE REMOVAL Left   . TONSILLECTOMY      Prior to Admission medications   Medication Sig Start Date End Date Taking? Authorizing Provider  atorvastatin (LIPITOR) 80 MG tablet Take 1 tablet (80 mg total) by mouth at bedtime. Patient taking differently: Take 80 mg by mouth daily.  09/05/20  Yes Danford, Earl Lites, MD  clopidogrel (PLAVIX) 75 MG tablet Take 1 tablet (75 mg total) by mouth daily. 07/05/20  Yes Azucena Fallen, MD  gabapentin (NEURONTIN) 600 MG tablet Take 600-1,200 mg by mouth See admin instructions. Take 600 mg by mouth in the morning and 1,200 mg at bedtime   Yes [provider]  HUMALOG KWIKPEN 200 UNIT/ML KwikPen Inject 0-10 Units into the skin 3 (three) times daily with meals. Patient taking differently: Inject into the skin See admin instructions. Inject into the skin up to 4 times a day, per sliding scale: BGL 160 or greater = 10 units; 200 or greater = 14 units 07/05/20  Yes Azucena Fallen, MD  insulin lispro (HUMALOG KWIKPEN) 100 UNIT/ML KwikPen Inject into the skin.   Yes [provider]  latanoprost (XALATAN) 0.005 % ophthalmic solution Place 1 drop  into both eyes at bedtime.  07/04/20  Yes [provider]  levETIRAcetam (KEPPRA) 250 MG tablet Take 250 mg by mouth in the morning and at bedtime.   Yes [provider]  oxyCODONE-acetaminophen (PERCOCET) 10-325 MG tablet Take 1-2 tablets by mouth 4 (four) times daily. 10/24/20  Yes [provider]  zonisamide (ZONEGRAN) 100 MG capsule Take 200 mg by mouth at bedtime.  05/17/20  Yes [provider]  acetaminophen (TYLENOL 8 HOUR) 650 MG CR tablet Take 1 tablet (650 mg total) by mouth every 8 (eight) hours. 05/09/19   Nanavati, Ankit, MD  albuterol (PROVENTIL HFA;VENTOLIN HFA) 108 (90 Base) MCG/ACT  inhaler Inhale 1-2 puffs into the lungs every 6 (six) hours as needed for wheezing or shortness of breath. 11/15/18   Rancour, Stephen, MD  budesonide-formoterol (SYMBICORT) 80-4.5 MCG/ACT inhaler Inhale 2 puffs into the lungs daily.    [provider]  ergocalciferol (VITAMIN D2) 1.25 MG (50000 UT) capsule Take 50,000 Units by mouth every Monday.    [provider]  fenofibrate (TRICOR) 145 MG tablet Take 145 mg by mouth daily. 10/24/19   [provider]  fluticasone-salmeterol (ADVAIR HFA) 115-21 MCG/ACT inhaler Inhale 2 puffs into the lungs daily.    [provider]  levETIRAcetam (KEPPRA) 500 MG tablet Take 1 tablet (500 mg total) by mouth 2 (two) times daily. 750 mg twice daily for a month, then on 08/25/20 take 500 mg twice daily for a month then 09/24/20 take 250 mg twice daily for a month then stop Patient not taking: Reported on 10/31/2020 09/05/20   Danford, Christopher P, MD  metFORMIN (GLUCOPHAGE) 1000 MG tablet Take 1 tablet (1,000 mg total) by mouth 2 (two) times daily. Patient taking differently: Take 1,000 mg by mouth 2 (two) times daily with a meal.  07/05/20   Lancaster, Lynna Zamorano C, MD  metoprolol tartrate (LOPRESSOR) 100 MG tablet Take 1 tablet (100 mg total) by mouth 2 (two) times daily. - Metoprolol tartarate 100 mg 2 hours before CT scan 10/21/20 01/19/21  Patwardhan, Manish J, MD  nortriptyline (PAMELOR) 50 MG capsule Take 100 mg by mouth at bedtime.     [provider]  pantoprazole (PROTONIX) 40 MG tablet Take 40 mg by mouth 2 (two) times daily. 07/03/20   [provider]  polyethylene glycol powder (GLYCOLAX/MIRALAX) 17 GM/SCOOP powder Take 17 g by mouth daily as needed for mild constipation. 12/31/15   [provider]  rizatriptan (MAXALT) 10 MG tablet Take 10 mg by mouth daily. May repeat in 2 hours if needed     [provider]  sertraline (ZOLOFT) 50 MG tablet Take 50 mg by mouth daily. 06/13/20   [provider]  SUMAtriptan (IMITREX) 100 MG tablet Take 1 tablet (100 mg total) by mouth daily as needed for migraine or headache. May repeat in 2 hours if headache persists or recurs. 07/25/20   Gherghe, Costin M, MD  timolol (TIMOPTIC) 0.5 % ophthalmic solution Place 1 drop into both eyes 2 (two) times daily.  04/06/20   [provider]  valsartan (DIOVAN) 320 MG tablet Take 320 mg by mouth daily. 07/04/20   [provider]    Current Facility-Administered Medications  Medication Dose Route Frequency Provider Last Rate Last Admin  . 0.9 %  sodium chloride infusion   Intravenous Continuous Zierle-Ghosh, Asia B, DO 75 mL/hr at 10/31/20 2247 New Bag at 10/31/20 2247  . acetaminophen (TYLENOL) tablet 650 mg  650 mg Oral Q6H   PRN Zierle-Ghosh, Asia B, DO   650 mg at 11/01/20 0549   Or  . acetaminophen (TYLENOL) suppository 650 mg  650 mg Rectal Q6H PRN Zierle-Ghosh, Asia B, DO      . albuterol (VENTOLIN HFA) 108 (90 Base) MCG/ACT inhaler 1-2 puff  1-2 puff Inhalation Q6H PRN Zierle-Ghosh, Asia B, DO      . atorvastatin (LIPITOR) tablet 80 mg  80 mg Oral Daily Zierle-Ghosh, Asia B, DO   80 mg at 11/01/20 1024  . Chlorhexidine Gluconate Cloth 2 % PADS 6 each  6 each Topical Daily Swayze, Ava, DO   6 each at 11/01/20 1214  . fenofibrate tablet 160 mg  160 mg Oral Daily Zierle-Ghosh, Asia B, DO   160 mg at 11/01/20 1024  . gabapentin (NEURONTIN) capsule 1,200 mg  1,200 mg Oral QHS Zierle-Ghosh, Asia B, DO   1,200 mg at 10/31/20 2246  . gabapentin (NEURONTIN) capsule 600 mg  600 mg Oral Daily Zierle-Ghosh, Asia B, DO   600 mg at 11/01/20 1024  . insulin aspart (novoLOG) injection 0-15 Units  0-15 Units Subcutaneous TID WC Zierle-Ghosh, Asia B, DO   2 Units at 11/01/20 1213  . levETIRAcetam (KEPPRA) tablet 250 mg  250 mg Oral BID Zierle-Ghosh, Asia B, DO   250 mg at 11/01/20 1025  . metoprolol tartrate (LOPRESSOR) tablet 100 mg  100 mg Oral BID Zierle-Ghosh, Asia B, DO   100 mg at 11/01/20  1025  . mometasone-formoterol (DULERA) 100-5 MCG/ACT inhaler 2 puff  2 puff Inhalation BID Zierle-Ghosh, Asia B, DO   2 puff at 11/01/20 0749  . nortriptyline (PAMELOR) capsule 100 mg  100 mg Oral QHS Zierle-Ghosh, Asia B, DO   100 mg at 10/31/20 2323  . ondansetron (ZOFRAN) tablet 4 mg  4 mg Oral Q6H PRN Zierle-Ghosh, Asia B, DO       Or  . ondansetron (ZOFRAN) injection 4 mg  4 mg Intravenous Q6H PRN Zierle-Ghosh, Asia B, DO      . oxyCODONE-acetaminophen (PERCOCET/ROXICET) 5-325 MG per tablet 1 tablet  1 tablet Oral Q8H PRN Zierle-Ghosh, Asia B, DO   1 tablet at 11/01/20 1024   And  . oxyCODONE (Oxy IR/ROXICODONE) immediate release tablet 5 mg  5 mg Oral Q8H PRN Zierle-Ghosh, Asia B, DO   5 mg at 11/01/20 0548  . pantoprazole (PROTONIX) injection 40 mg  40 mg Intravenous Q12H Zierle-Ghosh, Asia B, DO   40 mg at 11/01/20 1025  . sertraline (ZOLOFT) tablet 50 mg  50 mg Oral Daily Zierle-Ghosh, Asia B, DO   50 mg at 11/01/20 1025    Allergies as of 10/31/2020 - Review Complete 10/31/2020  Allergen Reaction Noted  . Levothyroxine Anaphylaxis and Cough 07/04/2020  . Nsaids Other (See Comments) 11/19/2013  . Amoxicillin Itching and Other (See Comments) 11/08/2013  . Ampicillin Other (See Comments) 11/08/2013  . Penicillins Itching and Other (See Comments) 11/08/2013  . Strawberry extract Swelling 11/08/2013  . Asa [aspirin] Other (See Comments) 07/04/2020  . Bactrim [sulfamethoxazole-trimethoprim] Hives, Itching, and Other (See Comments) 05/03/2020  . Dapagliflozin  10/10/2020  . Depakote [divalproex sodium]  06/06/2017  . Dilantin [phenytoin] Other (See Comments) 05/03/2020  . Methocarbamol Other (See Comments) 07/04/2020  . Other  10/31/2020  . Risperidone and related Other (See Comments) 05/03/2020  . Sitagliptin Other (See Comments) 07/04/2020  . Strawberry (diagnostic) Itching and Swelling 05/03/2020  . Sulfa antibiotics Other (See Comments) 07/05/2020  . Tolmetin Other (See  Comments) 07/04/2020  .  Ultram [tramadol hcl] Other (See Comments) 10/13/2017  . Buprenorphine Itching, Rash, and Other (See Comments) 10/31/2020  . Oatmeal Hives and Other (See Comments) 11/08/2013    Family History  Problem Relation Age of Onset  . Heart failure Mother   . Diabetes Mother   . Hypertension Mother   . Cirrhosis Father   . Heart failure Father   . Kidney disease Father     Social History   Socioeconomic History  . Marital status: Widowed    Spouse name: Not on file  . Number of children: Not on file  . Years of education: Not on file  . Highest education level: Not on file  Occupational History  . Not on file  Tobacco Use  . Smoking status: Former Smoker    Packs/day: 0.25    Years: 15.00    Pack years: 3.75    Types: Cigarettes    Quit date: 11/08/1992    Years since quitting: 28.0  . Smokeless tobacco: Never Used  Vaping Use  . Vaping Use: Never used  Substance and Sexual Activity  . Alcohol use: No  . Drug use: No  . Sexual activity: Not Currently  Other Topics Concern  . Not on file  Social History Narrative   ** Merged History Encounter **       Social Determinants of Health   Financial Resource Strain:   . Difficulty of Paying Living Expenses: Not on file  Food Insecurity:   . Worried About Programme researcher, broadcasting/film/video in the Last Year: Not on file  . Ran Out of Food in the Last Year: Not on file  Transportation Needs:   . Lack of Transportation (Medical): Not on file  . Lack of Transportation (Non-Medical): Not on file  Physical Activity:   . Days of Exercise per Week: Not on file  . Minutes of Exercise per Session: Not on file  Stress:   . Feeling of Stress : Not on file  Social Connections:   . Frequency of Communication with Friends and Family: Not on file  . Frequency of Social Gatherings with Friends and Family: Not on file  . Attends Religious Services: Not on file  . Active Member of Clubs or Organizations: Not on file  . Attends  Banker Meetings: Not on file  . Marital Status: Not on file  Intimate Partner Violence:   . Fear of Current or Ex-Partner: Not on file  . Emotionally Abused: Not on file  . Physically Abused: Not on file  . Sexually Abused: Not on file    Review of Systems: As per HPI, all others negative  Physical Exam: Vital signs in last 24 hours: Temp:  [97.4 F (36.3 C)-98.1 F (36.7 C)] 97.4 F (36.3 C) (11/26 1003) Pulse Rate:  [64-97] 69 (11/26 1003) Resp:  [13-25] 18 (11/26 1003) BP: (109-136)/(64-98) 121/84 (11/26 1003) SpO2:  [89 %-99 %] 92 % (11/26 1003) Last BM Date: 10/30/20 General:   Alert,  Well-developed, well-nourished, pleasant and cooperative in NAD Head:  Normocephalic and atraumatic. Eyes:  Sclera clear, no icterus.   Conjunctiva pink. Ears:  Normal auditory acuity. Nose:  No deformity, discharge,  or lesions. Mouth:  No deformity or lesions.  Oropharynx pink & moist. Neck:  Supple; no masses or thyromegaly. Abdomen:  Soft, nontender and nondistended. No masses, hepatosplenomegaly or hernias noted. Normal bowel sounds, without guarding, and without rebound.     Msk:  Symmetrical without gross deformities. Normal posture. Pulses:  Normal pulses noted. Extremities:  Without clubbing or edema. Neurologic:  Alert and  oriented x4; diffusely weak, but otherwise grossly normal neurologically. Skin:  Intact without significant lesions or rashes. Psych:  Alert and cooperative. Normal mood and affect.   Lab Results: Recent Labs    10/31/20 1802 10/31/20 2324 11/01/20 0833  WBC 6.8 5.7 5.2  HGB 13.5 13.5 13.4  HCT 43.1 43.1 42.3  PLT 173 151 185   BMET Recent Labs    10/31/20 1802 11/01/20 0216  NA 139 142  K 4.3 4.5  CL 106 108  CO2 22 20*  GLUCOSE 167* 141*  BUN 19 19  CREATININE 1.68* 1.58*  CALCIUM 9.8 9.8   LFT Recent Labs    11/01/20 0216  PROT 6.6  ALBUMIN 3.9  AST 28  ALT 38  ALKPHOS 64  BILITOT 0.4   PT/INR No results for  input(s): LABPROT, INR in the last 72 hours.  Studies/Results: No results found.  Impression:  1.  Hematochezia.  Bright red, intermittent.  Normal BUN.  Sounds most consistent with colonic source. 2.  History of gastric ulcer with surgery (Bilroth2?). 3.  Bipolar disorder. 4.  History TIA. 5.  Chronic anticoagulation (clopidigrel).  Plan:  1.  Clear liquid diet today. 2.  Clopidigrel on hold. 3.  Colonoscopy tomorrow. 4.  [facility] will follow.   LOS: 0 days   Neilan Rizzo M  11/01/2020, 1:11 PM  Cell [phone] If no answer or after 5 PM call [phone]

## 2020-11-01 NOTE — Progress Notes (Signed)
Received patient from ED, AOx4, VSS, pain at 2/10, oriented to room, bed controls and plan of care.  Patient now resting on bed comfortably with both eyes closed.  Will monitor.

## 2020-11-01 NOTE — Consult Note (Signed)
[facility] Consultation Note  Referring Provider: [facility] Primary Care Physician:  Patient, No Pcp Per Primary Gastroenterologist:  Dr. Annita Brod (surgeon/GI in [place], [place])  Reason for Consultation:  hematochezia  HPI: Nicholas [name] is a 59 y.o. male admitted for hematochezia.  Has history of gastric ulcers, and had some type of gastric surgery few years ago for that.  Starting yesterday, patient had some generalized lower abdominal discomfort as well as some bright red hematochezia.  No further bleeding since last night.  Reports having some type of intervention done on his anal sphincter (?) and has some bowel incontinence from time-to-time.  Last colonoscopy per patient about 6 years ago by Dr. Micah Noel, and was told to have repeat examination within 5 years.   Past Medical History:  Diagnosis Date  . Asthma   . Bipolar 1 disorder (HCC)   . Borderline glaucoma   . Chronic pain   . Epididymal pain    LEFT  . 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 Feeling of incomplete bladder emptying   . Frequency of urination   . Gastric ulcer   . GERD (gastroesophageal reflux disease)   . Hypertension   . Hyperthyroidism    NO MEDS   . Migraine   . Seizures (HCC)   . TIA (transient ischemic attack)   . Type 2 diabetes mellitus (HCC)     Past Surgical History:  Procedure Laterality Date  . ABDOMINAL SURGERY    . ANTERIOR CERVICAL DECOMP/DISCECTOMY FUSION  2007   C4  --  C6  . CERVICAL FUSION    . CHOLECYSTECTOMY    . CYSTOSCOPY N/A 11/09/2013   Procedure: CYSTOSCOPY FLEXIBLE;  Surgeon: Bjorn Pippin, MD;  Location: [facility];  Service: Urology;  Laterality: N/A;  . EPIDIDYMECTOMY Left 11/09/2013   Procedure:  LEFT EPIDIDYMECTOMY;  Surgeon: Bjorn Pippin, MD;  Location: [facility];  Service: Urology;  Laterality: Left;  POSSIBLE OUTPATIENT  WITH OBSERVATION  . EXCISION LIPOMA LEFT SHOULDER  2004 (APPROX)  . MULTIPLE CYST REMOVED FROM CHEST  AGE 32  . OTHER SURGICAL HISTORY     hemorroid surgery   . TESTICLE REMOVAL Left   . TONSILLECTOMY      Prior to Admission medications   Medication Sig Start Date End Date Taking? Authorizing Provider  atorvastatin (LIPITOR) 80 MG tablet Take 1 tablet (80 mg total) by mouth at bedtime. Patient taking differently: Take 80 mg by mouth daily.  09/05/20  Yes Danford, Earl Lites, MD  clopidogrel (PLAVIX) 75 MG tablet Take 1 tablet (75 mg total) by mouth daily. 07/05/20  Yes Azucena Fallen, MD  gabapentin (NEURONTIN) 600 MG tablet Take 600-1,200 mg by mouth See admin instructions. Take 600 mg by mouth in the morning and 1,200 mg at bedtime   Yes [provider]  HUMALOG KWIKPEN 200 UNIT/ML KwikPen Inject 0-10 Units into the skin 3 (three) times daily with meals. Patient taking differently: Inject into the skin See admin instructions. Inject into the skin up to 4 times a day, per sliding scale: BGL 160 or greater = 10 units; 200 or greater = 14 units 07/05/20  Yes Azucena Fallen, MD  insulin lispro (HUMALOG KWIKPEN) 100 UNIT/ML KwikPen Inject into the skin.   Yes [provider]  latanoprost (XALATAN) 0.005 % ophthalmic solution Place 1 drop  into both eyes at bedtime.  07/04/20  Yes [provider]  levETIRAcetam (KEPPRA) 250 MG tablet Take 250 mg by mouth in the morning and at bedtime.   Yes [provider]  oxyCODONE-acetaminophen (PERCOCET) 10-325 MG tablet Take 1-2 tablets by mouth 4 (four) times daily. 10/24/20  Yes [provider]  zonisamide (ZONEGRAN) 100 MG capsule Take 200 mg by mouth at bedtime.  05/17/20  Yes [provider]  acetaminophen (TYLENOL 8 HOUR) 650 MG CR tablet Take 1 tablet (650 mg total) by mouth every 8 (eight) hours. 05/09/19   Derwood Kaplan, MD  albuterol (PROVENTIL HFA;VENTOLIN HFA) 108 (90 Base) MCG/ACT  inhaler Inhale 1-2 puffs into the lungs every 6 (six) hours as needed for wheezing or shortness of breath. 11/15/18   Rancour, Jeannett Senior, MD  budesonide-formoterol (SYMBICORT) 80-4.5 MCG/ACT inhaler Inhale 2 puffs into the lungs daily.    [provider]  ergocalciferol (VITAMIN D2) 1.25 MG (50000 UT) capsule Take 50,000 Units by mouth every Monday.    [provider]  fenofibrate (TRICOR) 145 MG tablet Take 145 mg by mouth daily. 10/24/19   [provider]  fluticasone-salmeterol (ADVAIR HFA) 115-21 MCG/ACT inhaler Inhale 2 puffs into the lungs daily.    [provider]  levETIRAcetam (KEPPRA) 500 MG tablet Take 1 tablet (500 mg total) by mouth 2 (two) times daily. 750 mg twice daily for a month, then on 08/25/20 take 500 mg twice daily for a month then 09/24/20 take 250 mg twice daily for a month then stop Patient not taking: Reported on 10/31/2020 09/05/20   Alberteen Sam, MD  metFORMIN (GLUCOPHAGE) 1000 MG tablet Take 1 tablet (1,000 mg total) by mouth 2 (two) times daily. Patient taking differently: Take 1,000 mg by mouth 2 (two) times daily with a meal.  07/05/20   Azucena Fallen, MD  metoprolol tartrate (LOPRESSOR) 100 MG tablet Take 1 tablet (100 mg total) by mouth 2 (two) times daily. - Metoprolol tartarate 100 mg 2 hours before CT scan 10/21/20 01/19/21  Patwardhan, Anabel Bene, MD  nortriptyline (PAMELOR) 50 MG capsule Take 100 mg by mouth at bedtime.     [provider]  pantoprazole (PROTONIX) 40 MG tablet Take 40 mg by mouth 2 (two) times daily. 07/03/20   [provider]  polyethylene glycol powder (GLYCOLAX/MIRALAX) 17 GM/SCOOP powder Take 17 g by mouth daily as needed for mild constipation. 12/31/15   [provider]  rizatriptan (MAXALT) 10 MG tablet Take 10 mg by mouth daily. May repeat in 2 hours if needed     [provider]  sertraline (ZOLOFT) 50 MG tablet Take 50 mg by mouth daily. 06/13/20   [provider]  SUMAtriptan (IMITREX) 100 MG tablet Take 1 tablet (100 mg total) by mouth daily as needed for migraine or headache. May repeat in 2 hours if headache persists or recurs. 07/25/20   Leatha Gilding, MD  timolol (TIMOPTIC) 0.5 % ophthalmic solution Place 1 drop into both eyes 2 (two) times daily.  04/06/20   [provider]  valsartan (DIOVAN) 320 MG tablet Take 320 mg by mouth daily. 07/04/20   [provider]    Current Facility-Administered Medications  Medication Dose Route Frequency Provider Last Rate Last Admin  . 0.9 %  sodium chloride infusion   Intravenous Continuous Zierle-Ghosh, Asia B, DO 75 mL/hr at 10/31/20 2247 New Bag at 10/31/20 2247  . acetaminophen (TYLENOL) tablet 650 mg  650 mg Oral Q6H  PRN Zierle-Ghosh, Asia B, DO   650 mg at 11/01/20 0549   Or  . acetaminophen (TYLENOL) suppository 650 mg  650 mg Rectal Q6H PRN Zierle-Ghosh, Asia B, DO      . albuterol (VENTOLIN HFA) 108 (90 Base) MCG/ACT inhaler 1-2 puff  1-2 puff Inhalation Q6H PRN Zierle-Ghosh, Asia B, DO      . atorvastatin (LIPITOR) tablet 80 mg  80 mg Oral Daily Zierle-Ghosh, Asia B, DO   80 mg at 11/01/20 1024  . Chlorhexidine Gluconate Cloth 2 % PADS 6 each  6 each Topical Daily Swayze, Ava, DO   6 each at 11/01/20 1214  . fenofibrate tablet 160 mg  160 mg Oral Daily Zierle-Ghosh, Asia B, DO   160 mg at 11/01/20 1024  . gabapentin (NEURONTIN) capsule 1,200 mg  1,200 mg Oral QHS Zierle-Ghosh, Asia B, DO   1,200 mg at 10/31/20 2246  . gabapentin (NEURONTIN) capsule 600 mg  600 mg Oral Daily Zierle-Ghosh, Asia B, DO   600 mg at 11/01/20 1024  . insulin aspart (novoLOG) injection 0-15 Units  0-15 Units Subcutaneous TID WC Zierle-Ghosh, Asia B, DO   2 Units at 11/01/20 1213  . levETIRAcetam (KEPPRA) tablet 250 mg  250 mg Oral BID Zierle-Ghosh, Asia B, DO   250 mg at 11/01/20 1025  . metoprolol tartrate (LOPRESSOR) tablet 100 mg  100 mg Oral BID Zierle-Ghosh, Asia B, DO   100 mg at 11/01/20  1025  . mometasone-formoterol (DULERA) 100-5 MCG/ACT inhaler 2 puff  2 puff Inhalation BID Zierle-Ghosh, Asia B, DO   2 puff at 11/01/20 0749  . nortriptyline (PAMELOR) capsule 100 mg  100 mg Oral QHS Zierle-Ghosh, Asia B, DO   100 mg at 10/31/20 2323  . ondansetron (ZOFRAN) tablet 4 mg  4 mg Oral Q6H PRN Zierle-Ghosh, Asia B, DO       Or  . ondansetron (ZOFRAN) injection 4 mg  4 mg Intravenous Q6H PRN Zierle-Ghosh, Asia B, DO      . oxyCODONE-acetaminophen (PERCOCET/ROXICET) 5-325 MG per tablet 1 tablet  1 tablet Oral Q8H PRN Zierle-Ghosh, Asia B, DO   1 tablet at 11/01/20 1024   And  . oxyCODONE (Oxy IR/ROXICODONE) immediate release tablet 5 mg  5 mg Oral Q8H PRN Zierle-Ghosh, Asia B, DO   5 mg at 11/01/20 0548  . pantoprazole (PROTONIX) injection 40 mg  40 mg Intravenous Q12H Zierle-Ghosh, Asia B, DO   40 mg at 11/01/20 1025  . sertraline (ZOLOFT) tablet 50 mg  50 mg Oral Daily Zierle-Ghosh, Asia B, DO   50 mg at 11/01/20 1025    Allergies as of 10/31/2020 - Review Complete 10/31/2020  Allergen Reaction Noted  . Levothyroxine Anaphylaxis and Cough 07/04/2020  . Nsaids Other (See Comments) 11/19/2013  . Amoxicillin Itching and Other (See Comments) 11/08/2013  . Ampicillin Other (See Comments) 11/08/2013  . Penicillins Itching and Other (See Comments) 11/08/2013  . Strawberry extract Swelling 11/08/2013  . Asa [aspirin] Other (See Comments) 07/04/2020  . Bactrim [sulfamethoxazole-trimethoprim] Hives, Itching, and Other (See Comments) 05/03/2020  . Dapagliflozin  10/10/2020  . Depakote [divalproex sodium]  06/06/2017  . Dilantin [phenytoin] Other (See Comments) 05/03/2020  . Methocarbamol Other (See Comments) 07/04/2020  . Other  10/31/2020  . Risperidone and related Other (See Comments) 05/03/2020  . Sitagliptin Other (See Comments) 07/04/2020  . Strawberry (diagnostic) Itching and Swelling 05/03/2020  . Sulfa antibiotics Other (See Comments) 07/05/2020  . Tolmetin Other (See  Comments) 07/04/2020  .  Ultram [tramadol hcl] Other (See Comments) 10/13/2017  . Buprenorphine Itching, Rash, and Other (See Comments) 10/31/2020  . Oatmeal Hives and Other (See Comments) 11/08/2013    Family History  Problem Relation Age of Onset  . Heart failure Mother   . Diabetes Mother   . Hypertension Mother   . Cirrhosis Father   . Heart failure Father   . Kidney disease Father     Social History   Socioeconomic History  . Marital status: Widowed    Spouse name: Not on file  . Number of children: Not on file  . Years of education: Not on file  . Highest education level: Not on file  Occupational History  . Not on file  Tobacco Use  . Smoking status: Former Smoker    Packs/day: 0.25    Years: 15.00    Pack years: 3.75    Types: Cigarettes    Quit date: 11/08/1992    Years since quitting: 28.0  . Smokeless tobacco: Never Used  Vaping Use  . Vaping Use: Never used  Substance and Sexual Activity  . Alcohol use: No  . Drug use: No  . Sexual activity: Not Currently  Other Topics Concern  . Not on file  Social History Narrative   ** Merged History Encounter **       Social Determinants of Health   Financial Resource Strain:   . Difficulty of Paying Living Expenses: Not on file  Food Insecurity:   . Worried About Programme researcher, broadcasting/film/video in the Last Year: Not on file  . Ran Out of Food in the Last Year: Not on file  Transportation Needs:   . Lack of Transportation (Medical): Not on file  . Lack of Transportation (Non-Medical): Not on file  Physical Activity:   . Days of Exercise per Week: Not on file  . Minutes of Exercise per Session: Not on file  Stress:   . Feeling of Stress : Not on file  Social Connections:   . Frequency of Communication with Friends and Family: Not on file  . Frequency of Social Gatherings with Friends and Family: Not on file  . Attends Religious Services: Not on file  . Active Member of Clubs or Organizations: Not on file  . Attends  Banker Meetings: Not on file  . Marital Status: Not on file  Intimate Partner Violence:   . Fear of Current or Ex-Partner: Not on file  . Emotionally Abused: Not on file  . Physically Abused: Not on file  . Sexually Abused: Not on file    Review of Systems: As per HPI, all others negative  Physical Exam: Vital signs in last 24 hours: Temp:  [97.4 F (36.3 C)-98.1 F (36.7 C)] 97.4 F (36.3 C) (11/26 1003) Pulse Rate:  [64-97] 69 (11/26 1003) Resp:  [13-25] 18 (11/26 1003) BP: (109-136)/(64-98) 121/84 (11/26 1003) SpO2:  [89 %-99 %] 92 % (11/26 1003) Last BM Date: 10/30/20 General:   Alert,  Well-developed, well-nourished, pleasant and cooperative in NAD Head:  Normocephalic and atraumatic. Eyes:  Sclera clear, no icterus.   Conjunctiva pink. Ears:  Normal auditory acuity. Nose:  No deformity, discharge,  or lesions. Mouth:  No deformity or lesions.  Oropharynx pink & moist. Neck:  Supple; no masses or thyromegaly. Abdomen:  Soft, nontender and nondistended. No masses, hepatosplenomegaly or hernias noted. Normal bowel sounds, without guarding, and without rebound.     Msk:  Symmetrical without gross deformities. Normal posture. Pulses:  Normal pulses noted. Extremities:  Without clubbing or edema. Neurologic:  Alert and  oriented x4; diffusely weak, but otherwise grossly normal neurologically. Skin:  Intact without significant lesions or rashes. Psych:  Alert and cooperative. Normal mood and affect.   Lab Results: Recent Labs    10/31/20 1802 10/31/20 2324 11/01/20 0833  WBC 6.8 5.7 5.2  HGB 13.5 13.5 13.4  HCT 43.1 43.1 42.3  PLT 173 151 185   BMET Recent Labs    10/31/20 1802 11/01/20 0216  NA 139 142  K 4.3 4.5  CL 106 108  CO2 22 20*  GLUCOSE 167* 141*  BUN 19 19  CREATININE 1.68* 1.58*  CALCIUM 9.8 9.8   LFT Recent Labs    11/01/20 0216  PROT 6.6  ALBUMIN 3.9  AST 28  ALT 38  ALKPHOS 64  BILITOT 0.4   PT/INR No results for  input(s): LABPROT, INR in the last 72 hours.  Studies/Results: No results found.  Impression:  1.  Hematochezia.  Bright red, intermittent.  Normal BUN.  Sounds most consistent with colonic source. 2.  History of gastric ulcer with surgery (Bilroth2?). 3.  Bipolar disorder. 4.  History TIA. 5.  Chronic anticoagulation (clopidigrel).  Plan:  1.  Clear liquid diet today. 2.  Clopidigrel on hold. 3.  Colonoscopy tomorrow. 4.  [facility] will follow.   LOS: 0 days   Krisha Beegle M  11/01/2020, 1:11 PM  Cell [phone] If no answer or after 5 PM call [phone]

## 2020-11-01 NOTE — Care Management Obs Status (Signed)
MEDICARE OBSERVATION STATUS NOTIFICATION   Patient Details  Name: ERIQUE KASER MRN: [id number] Date of Birth: 1961/01/09   Medicare Observation Status Notification Given:  Yes    Bess Kinds, RN 11/01/2020, 3:52 PM

## 2020-11-01 NOTE — Progress Notes (Signed)
Patient c/o that he cannot void, bladder scan showed 279 ml. Patient had voided in ED before coming up to 6 [place] but no documentation. Text paged on-call [facility] of this concern and awaiting for response.  Will endorse to day shift RN appropriately.

## 2020-11-01 NOTE — Progress Notes (Signed)
Pt bladder scanned with 433 cc noted despite pt voiding . MD notified and order for foley catheter insertion given

## 2020-11-02 ENCOUNTER — Encounter (HOSPITAL_COMMUNITY): Payer: Self-pay | Admitting: Family Medicine

## 2020-11-02 ENCOUNTER — Observation Stay (HOSPITAL_COMMUNITY): Payer: Medicare Other | Admitting: Certified Registered Nurse Anesthetist

## 2020-11-02 ENCOUNTER — Encounter (HOSPITAL_COMMUNITY)
Admission: EM | Disposition: A | Discharge status: Home / Self Care | Payer: Self-pay | Source: Home / Self Care | Attending: Emergency Medicine

## 2020-11-02 DIAGNOSIS — E119 Type 2 diabetes mellitus without complications: Secondary | ICD-10-CM | POA: Diagnosis not present

## 2020-11-02 DIAGNOSIS — Z20822 Contact with and (suspected) exposure to covid-19: Secondary | ICD-10-CM | POA: Diagnosis not present

## 2020-11-02 DIAGNOSIS — K625 Hemorrhage of anus and rectum: Secondary | ICD-10-CM | POA: Diagnosis not present

## 2020-11-02 DIAGNOSIS — J45909 Unspecified asthma, uncomplicated: Secondary | ICD-10-CM | POA: Diagnosis not present

## 2020-11-02 HISTORY — PX: COLONOSCOPY WITH PROPOFOL: SHX5780

## 2020-11-02 LAB — GLUCOSE, CAPILLARY
Glucose-Capillary: 115 mg/dL — ABNORMAL HIGH (ref 70–99)
Glucose-Capillary: 131 mg/dL — ABNORMAL HIGH (ref 70–99)

## 2020-11-02 SURGERY — COLONOSCOPY WITH PROPOFOL
Anesthesia: Monitor Anesthesia Care | Laterality: Left

## 2020-11-02 MED ORDER — LACTATED RINGERS IV SOLN
INTRAVENOUS | Status: AC | PRN
  Administered 2020-11-02: 1000 mL via INTRAVENOUS

## 2020-11-02 MED ORDER — PROPOFOL 500 MG/50ML IV EMUL
INTRAVENOUS | Status: DC | PRN
  Administered 2020-11-02: 100 ug/kg/min via INTRAVENOUS

## 2020-11-02 MED ORDER — PROPOFOL 10 MG/ML IV BOLUS
INTRAVENOUS | Status: DC | PRN
  Administered 2020-11-02 (×2): 20 mg via INTRAVENOUS

## 2020-11-02 MED ORDER — FENOFIBRATE 145 MG PO TABS
145.0000 mg | ORAL_TABLET | Freq: Every day | ORAL | 0 refills | Status: DC
Start: 2020-11-02 — End: 2023-04-12

## 2020-11-02 MED ORDER — PHENYLEPHRINE 40 MCG/ML (10ML) SYRINGE FOR IV PUSH (FOR BLOOD PRESSURE SUPPORT)
PREFILLED_SYRINGE | INTRAVENOUS | Status: DC | PRN
  Administered 2020-11-02: 80 ug via INTRAVENOUS

## 2020-11-02 MED ORDER — GABAPENTIN 400 MG PO CAPS
1200.0000 mg | ORAL_CAPSULE | Freq: Every day | ORAL | 0 refills | Status: DC
Start: 2020-11-02 — End: 2020-12-06

## 2020-11-02 MED ORDER — GABAPENTIN 300 MG PO CAPS
600.0000 mg | ORAL_CAPSULE | Freq: Every day | ORAL | 0 refills | Status: DC
Start: 2020-11-03 — End: 2020-12-06

## 2020-11-02 SURGICAL SUPPLY — 22 items

## 2020-11-02 NOTE — Transfer of Care (Signed)
Immediate Anesthesia Transfer of Care Note  Patient: Nicholas [name]  Procedure(s) Performed: COLONOSCOPY WITH PROPOFOL (Left )  Patient Location: PACU  Anesthesia Type:MAC  Level of Consciousness: drowsy  Airway & Oxygen Therapy: Patient Spontanous Breathing  Post-op Assessment: Report given to RN and Post -op Vital signs reviewed and stable  Post vital signs: Reviewed and stable  Last Vitals:  Vitals Value Taken Time  BP    Temp    Pulse    Resp    SpO2      Last Pain:  Vitals:   11/02/20 0939  TempSrc: Oral  PainSc: 10-Worst pain ever         Complications: No complications documented.

## 2020-11-02 NOTE — Progress Notes (Signed)
Situation: Initial visit for spiritual/emotional support for pt [place] regarding pt requesting prayer.  Background: Facts: Mr. Vyron shared that he was going home today after being hospitalized since Thursday. Family: None shared at this time. Feelings: Mr. Georgio smiled and expressed joy and thankfulness that he was going home today. Faith: Mr. 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 He also expressed a trust in God's care, sharing that "He holds [place] in the palm of His hand."  Actions & Assessments: Chaplain offered prayer of blessing and thanksgiving over Mr. Ramon Dredge, thanking God for healing, and praying that he might continue to be healthy and safe.  Recommendations: Chaplain remains available for follow-up spiritual/emotional support as needed.  Respectfully submitted, Rev. Mayme Genta, MDiv      11/02/20 1600  Clinical Encounter Type  Visited With Patient  Visit Type Initial;Spiritual support  Spiritual Encounters  Spiritual Needs Prayer

## 2020-11-02 NOTE — Anesthesia Preprocedure Evaluation (Signed)
Anesthesia Evaluation  Patient identified by MRN, date of birth, ID band Patient awake    Reviewed: Allergy & Precautions, NPO status , Patient's Chart, lab work & pertinent test results  History of Anesthesia Complications Negative for: history of anesthetic complications  Airway Mallampati: II  TM Distance: >3 FB Neck ROM: Full    Dental  (+) Dental Advisory Given, Teeth Intact   Pulmonary former smoker,    breath sounds clear to auscultation       Cardiovascular hypertension, Pt. on medications +CHF   Rhythm:Regular  . Left ventricular ejection fraction, by estimation, is 45 to 50%. The  left ventricle has mildly decreased function. The left ventricle  demonstrates regional wall motion abnormalities (see scoring  diagram/findings for description). There is mild left  ventricular hypertrophy. Left ventricular diastolic parameters are  consistent with Grade I diastolic dysfunction (impaired relaxation). There  is moderate hypokinesis of the left ventricular, basal-mid inferolateral  wall and inferior wall suggesting  possible circumflex territory ischemia  2. Right ventricular systolic function is mildly reduced. The right  ventricular size is normal. There is normal pulmonary artery systolic  pressure. The estimated right ventricular systolic pressure is 32.1 mmHg.  3. The mitral valve is abnormal. Trivial mitral valve regurgitation.  4. The aortic valve is tricuspid. Aortic valve regurgitation is not  visualized.  5. The inferior vena cava is normal in size with greater than 50%  respiratory variability, suggesting right atrial pressure of 3 mmHg.    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 Gi bleed   Endo/Other  diabetes, Type 2  Renal/GU Renal diseaseLab Results       Component                Value               Date                      CREATININE               1.58 (H)            11/01/2020           Lab Results      Component                Value               Date                      K                        4.5                 11/01/2020                Musculoskeletal   Abdominal   Peds  Hematology Lab Results      Component                Value               Date                      WBC  5.3                 11/01/2020                HGB                      13.6                11/01/2020                HCT                      44.8                11/01/2020                MCV                      89.4                11/01/2020                PLT                      158                 11/01/2020            plavix   Anesthesia Other Findings   Reproductive/Obstetrics                             Anesthesia Physical Anesthesia Plan  ASA: III  Anesthesia Plan: MAC   Post-op Pain Management:    Induction: Intravenous  PONV Risk Score and Plan: 1 and Treatment may vary due to age or medical condition and Propofol infusion  Airway Management Planned: Nasal Cannula  Additional Equipment: None  Intra-op Plan:   Post-operative Plan:   Informed Consent: I have reviewed the patients History and Physical, chart, labs and discussed the procedure including the risks, benefits and alternatives for the proposed anesthesia with the patient or authorized representative who has indicated his/her understanding and acceptance.     Dental advisory given  Plan Discussed with: CRNA  Anesthesia Plan Comments:         Anesthesia Quick Evaluation

## 2020-11-02 NOTE — Interval H&P Note (Signed)
History and Physical Interval Note:  11/02/2020 10:19 AM  [name]  has presented today for surgery, with the diagnosis of blood in stool.  The various methods of treatment have been discussed with the patient and family. After consideration of risks, benefits and other options for treatment, the patient has consented to  Procedure(s): COLONOSCOPY WITH PROPOFOL (Left) as a surgical intervention.  The patient's history has been reviewed, patient examined, no change in status, stable for surgery.  I have reviewed the patient's chart and labs.  Questions were answered to the patient's satisfaction.     Nicholas [name]

## 2020-11-02 NOTE — TOC Transition Note (Signed)
Transition of Care [facility]) - CM/SW Discharge Note   Patient Details  Name: Nicholas [name] MRN: [id number] Date of Birth: [birth year]  [facility] CM/SW Contact:  Luvenia Redden, RN Phone Number: 11/02/2020, 3:35 PM   Clinical Narrative:    Pt has requested DME for a cane. Arrangements for [employer] to deliver cane a bedside today.   TOC team available for any other needs.       Patient Goals and CMS Choice        Discharge Placement                       Discharge Plan and Services                                     Social Determinants of Health (SDOH) Interventions     Readmission Risk Interventions No flowsheet data found.

## 2020-11-02 NOTE — Progress Notes (Signed)
AVS given and reviewed with pt. Medications discussed. Cane delivered to bedside. All questions answered to satisfaction. Pt verbalized understanding of information given. Pt escorted off the unit with all belongings via wheelchair by staff member.

## 2020-11-02 NOTE — Care Management (Signed)
Cane to be delivered to the room, taxi voucher given to nurse for assistance with transport home.

## 2020-11-02 NOTE — Discharge Summary (Signed)
Physician Discharge Summary  Nicholas [name] VPX:[id number] DOB: November 08, 1961 DOA: 10/31/2020  PCP: Patient, No Pcp Per  Admit date: 10/31/2020 Discharge date: 11/02/2020  Recommendations for Outpatient Follow-up:  1. Discharge to home. 2. Keep left forearm elevated. May use ice or moist heat for comfort. 3. Follow up with PCP in 7-10 days. 4. Have chemistry and CBC checked on that visit.  Discharge Diagnoses: Principal diagnosis is #1 1. Rectal bleeding 2. Mild AKI 3. Diabetes Mellitus Type 2 4. Hypertension 5. Chronic pain 6. Pain and swelling of left forearm due to IV infiltration  Discharge Condition: Fair  Disposition: Home  Diet recommendation: Heart healthy with modified carbohydrates  Filed Weights   11/01/20 1643 11/02/20 0939  Weight: 90.5 kg 90.5 kg    History of present illness: Nicholas [name]  is a 59 y.o. male, with history of diabetes mellitus type 2, TIAs, hypothyroidism, hypertension, GERD, bipolar 1 disorder, opiate seeking behavior, and more presents to the ER with a chief complaint of bright red blood per rectum.  Patient is a very poor historian.  He reports that he had a partial gastrectomy with GI at another facility some years ago.  He reports that he had follow-up with his GI 2 weeks ago.  The GI reportedly did an image and told the patient that he had some stool burden so he was prescribed laxatives.  The only imaging I can find in care everywhere is a CT abdomen of pelvis with contrast from October 15 and it showed a mild prominence of wall thickness of small bowel loops throughout the abdomen which is nonspecific but subtle changes of an underlying enteritis may be considered, and mild hepatic steatosis.  Patient reports that he was fine for a week, and then he started having a taste of blood in his mouth.  This was concerning to him so he drank Epson salts to clear himself out.  Yesterday he started having left lower quadrant stomach cramps, and urgently had  to go to the bathroom.  Patient reports that he wears depends and he went through for depends in 1 hour due to bloody bowel movements.  He reports bright red blood with no maroon color no clots.  He reports no stool mixed in.  The bleeding stopped after an hour but the stomach cramps continued.  He reports today he had generalized weakness, lightheadedness, and bright red blood per rectum returned.  On further questioning patient reports that the lightheadedness he experienced was secondary to the odor of the stool.  He reports bleeding and gushes, not dribbles.  His stomach cramps worsened from a crampy feeling to a sharp, dull, aching, twisting severe pain.  Patient reports that he has had 2 episodes of nausea and vomiting.  He reports that his emesis is liquid and appears red as well.  Patient's last normal bowel movement was on Monday.  Patient has had a normal appetite throughout the whole episode.  Review of systems he does report shortness of breath and dysuria that have been going on for months and he is currently in outpatient work-up for each.  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  Patient is a Optician, dispensing.  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  Patient reports he is never been a drinker.  He does not use heroin.  Patient is full code.  Hospital Course: [facility] have  been consulted to admit the patient for further evaluation and care. Gastroenterology was consulted from the ED. The patient's hemoglobin remained stable in the 13.4 - 13.6 range. GI was consulted and the patient underwent colonoscopy on 11/01/2020. It demonstrated internal hemorrhoids and diverticulosis. Dr. Dulce Sellarutlaw stated that the blood was likely from perianal irritation/hermorrhoids. The patient was cleared from discharge.  Upon my visit this morning the patient was very upset that overnight an IV in  his left forearm had infiltrated. The left forearm was swollen and painful. The patient stated that it was much more swollen when he first awoke this morning. It remained painful. He was quite upset about this.   Today's assessment: S: The patient was resting in bed. He was quite upset about the swelling in the his left forearm. O: Vitals:  Vitals:   11/02/20 1152 11/02/20 1531  BP: 99/75 114/67  Pulse: 62 63  Resp: 20 14  Temp: 97.7 F (36.5 C) (!) 97.5 F (36.4 C)  SpO2: 99% 96%   Exam:  Constitutional:  . The patient is awake, alert, and oriented x 3. No acute distress. Respiratory:  . No increased work of breathing. . No wheezes, rales, or rhonchi . No tactile fremitus Cardiovascular:  . Regular rate and rhythm . No murmurs, ectopy, or gallups. . No lateral PMI. No thrills. Abdomen:  . Abdomen is soft, non-tender, non-distended . No hernias, masses, or organomegaly . Normoactive bowel sounds.  Musculoskeletal:  . No cyanosis, clubbing, or edema . Left forearm is slightly swollen in combination to the right forearm. It is not erythematous or warm. There is no fluctuance. Skin:  . No rashes, lesions, ulcers . palpation of skin: no induration or nodules Neurologic:  . CN 2-12 intact . Sensation all 4 extremities intact Psychiatric:  . Mental status o Mood, affect appropriate o Orientation to person, place, time  . judgment and insight appear intact  Discharge Instructions  Discharge Instructions    Activity as tolerated - No restrictions   Complete by: As directed    Call MD for:  redness, tenderness, or signs of infection (pain, swelling, redness, odor or green/yellow discharge around incision site)   Complete by: As directed    Call MD for:  temperature >100.4   Complete by: As directed    Diet - low sodium heart healthy   Complete by: As directed    Diet Carb Modified   Complete by: As directed    Discharge instructions   Complete by: As directed     Discharge to home. Keep left forearm elevated. May use ice or moist heat for comfort. Follow up with PCP in 7-10 days. Have chemistry and CBC checked on that visit.   Increase activity slowly   Complete by: As directed      Allergies as of 11/02/2020      Reactions   Finasteride Other (See Comments)   Break out   Levothyroxine Anaphylaxis, Cough   Chronic cough   Nsaids Other (See Comments)   D/t gastric ulcer   Amoxicillin Itching, Other (See Comments)   THRUSH Causes sores in mouth   Ampicillin Other (See Comments)   THRUSH   Penicillins Itching, Other (See Comments)   THRUSH- Causes sores in mouth   Strawberry Extract Swelling   LIPS SWELL   Asa [aspirin] Other (See Comments)   Bleeding   Bactrim [sulfamethoxazole-trimethoprim] Hives, Itching, Other (See Comments)   GI upset   Dapagliflozin    Other reaction(s): Other (See  Comments) Allergic to steroids and had mouth broke out.    Depakote [divalproex Sodium]    Causes double vision and speech problems    Dilantin [phenytoin] Other (See Comments)   Severe skin peeling   Methocarbamol Other (See Comments)   dizziness   Other    Med for prostate that startes with a M- Broke out the insid eof mouth, split the tongue, and caused throudh   Risperidone And Related Other (See Comments)   Hallucinations   Sitagliptin Other (See Comments)   pancreatitis   Strawberry (diagnostic) Itching, Swelling   Sulfa Antibiotics Other (See Comments)   Affects thyroid   Tolmetin Other (See Comments)   D/t gastric ulcer   Ultram [tramadol Hcl] Other (See Comments)   D/t gastric ulcer   Buprenorphine Itching, Rash, Other (See Comments)   "Patches broke me out in red patches-  caused a fever, also"   Oatmeal Hives, Other (See Comments)   White spots/sores in mouth      Medication List    STOP taking these medications   acetaminophen 650 MG CR tablet Commonly known as: Tylenol 8 Hour   gabapentin 600 MG tablet Commonly known  as: NEURONTIN Replaced by: gabapentin 400 MG capsule   valsartan 160 MG tablet Commonly known as: DIOVAN     TAKE these medications   albuterol 108 (90 Base) MCG/ACT inhaler Commonly known as: VENTOLIN HFA Inhale 1-2 puffs into the lungs every 6 (six) hours as needed for wheezing or shortness of breath.   atorvastatin 80 MG tablet Commonly known as: LIPITOR Take 1 tablet (80 mg total) by mouth at bedtime. What changed: when to take this   budesonide-formoterol 80-4.5 MCG/ACT inhaler Commonly known as: SYMBICORT Inhale 2 puffs into the lungs daily.   clopidogrel 75 MG tablet Commonly known as: PLAVIX Take 1 tablet (75 mg total) by mouth daily.   fenofibrate 145 MG tablet Commonly known as: TRICOR Take 1 tablet (145 mg total) by mouth daily.   fluticasone-salmeterol 115-21 MCG/ACT inhaler Commonly known as: ADVAIR HFA Inhale 2 puffs into the lungs daily.   gabapentin 400 MG capsule Commonly known as: NEURONTIN Take 3 capsules (1,200 mg total) by mouth at bedtime. Replaces: gabapentin 600 MG tablet   gabapentin 300 MG capsule Commonly known as: NEURONTIN Take 2 capsules (600 mg total) by mouth daily. Start taking on: November 03, 2020   HumaLOG KwikPen 200 UNIT/ML KwikPen Generic drug: insulin lispro Inject 0-10 Units into the skin 3 (three) times daily with meals. What changed:   how much to take  when to take this  additional instructions   latanoprost 0.005 % ophthalmic solution Commonly known as: XALATAN Place 1 drop into both eyes at bedtime.   levETIRAcetam 250 MG tablet Commonly known as: KEPPRA Take 250 mg by mouth in the morning and at bedtime. What changed: Another medication with the same name was removed. Continue taking this medication, and follow the directions you see here.   metFORMIN 500 MG tablet Commonly known as: GLUCOPHAGE Take 500 mg by mouth 2 (two) times daily with a meal. What changed: Another medication with the same name was  removed. Continue taking this medication, and follow the directions you see here.   metoCLOPramide 10 MG tablet Commonly known as: REGLAN Take 10 mg by mouth 4 (four) times daily.   metoprolol tartrate 100 MG tablet Commonly known as: LOPRESSOR Take 1 tablet (100 mg total) by mouth 2 (two) times daily. - Metoprolol tartarate 100 mg  2 hours before CT scan   nortriptyline 50 MG capsule Commonly known as: PAMELOR Take 100 mg by mouth at bedtime.   oxyCODONE-acetaminophen 10-325 MG tablet Commonly known as: PERCOCET Take 1-2 tablets by mouth 4 (four) times daily.   pantoprazole 40 MG tablet Commonly known as: PROTONIX Take 40 mg by mouth 2 (two) times daily.   polyethylene glycol powder 17 GM/SCOOP powder Commonly known as: GLYCOLAX/MIRALAX Take 17 g by mouth daily as needed for mild constipation.   sertraline 50 MG tablet Commonly known as: ZOLOFT Take 50 mg by mouth daily.   SUMAtriptan 100 MG tablet Commonly known as: IMITREX Take 1 tablet (100 mg total) by mouth daily as needed for migraine or headache. May repeat in 2 hours if headache persists or recurs.   tamsulosin 0.4 MG Caps capsule Commonly known as: FLOMAX Take 0.4 mg by mouth in the morning and at bedtime.   timolol 0.5 % ophthalmic solution Commonly known as: TIMOPTIC Place 1 drop into both eyes 2 (two) times daily.            Durable Medical Equipment  (From admission, onward)         Start     Ordered   11/02/20 1527  For home use only DME Cane  Once        11/02/20 1527         Allergies  Allergen Reactions  . Finasteride Other (See Comments)    Break out  . Levothyroxine Anaphylaxis and Cough    Chronic cough  . Nsaids Other (See Comments)    D/t gastric ulcer  . Amoxicillin Itching and Other (See Comments)    THRUSH Causes sores in mouth  . Ampicillin Other (See Comments)    THRUSH  . Penicillins Itching and Other (See Comments)    THRUSH- Causes sores in mouth  . Strawberry  Extract Swelling    LIPS SWELL  . Asa [Aspirin] Other (See Comments)    Bleeding   . Bactrim [Sulfamethoxazole-Trimethoprim] Hives, Itching and Other (See Comments)    GI upset  . Dapagliflozin     Other reaction(s): Other (See Comments) Allergic to steroids and had mouth broke out.   . Depakote [Divalproex Sodium]     Causes double vision and speech problems   . Dilantin [Phenytoin] Other (See Comments)    Severe skin peeling  . Methocarbamol Other (See Comments)    dizziness  . Other     Med for prostate that startes with a M- Broke out the insid eof mouth, split the tongue, and caused throudh  . Risperidone And Related Other (See Comments)    Hallucinations   . Sitagliptin Other (See Comments)    pancreatitis  . Strawberry (Diagnostic) Itching and Swelling  . Sulfa Antibiotics Other (See Comments)    Affects thyroid   . Tolmetin Other (See Comments)    D/t gastric ulcer  . Ultram [Tramadol Hcl] Other (See Comments)    D/t gastric ulcer  . Buprenorphine Itching, Rash and Other (See Comments)    "Patches broke me out in red patches-  caused a fever, also"  . Oatmeal Hives and Other (See Comments)    White spots/sores in mouth    The results of significant diagnostics from this hospitalization (including imaging, microbiology, ancillary and laboratory) are listed below for reference.    Significant Diagnostic Studies: CT Head Wo Contrast  Result Date: 10/15/2020 CLINICAL DATA:  Delirium. EXAM: CT HEAD WITHOUT CONTRAST TECHNIQUE: Contiguous axial  images were obtained from the base of the skull through the vertex without intravenous contrast. COMPARISON:  September 02, 2020. FINDINGS: Brain: No evidence of acute infarction, hemorrhage, hydrocephalus, extra-axial collection or mass lesion/mass effect. Vascular: No hyperdense vessel or unexpected calcification. Skull: Normal. Negative for fracture or focal lesion. Sinuses/Orbits: No acute finding. Other: None. IMPRESSION: Normal  head CT. Electronically Signed   By: Lupita Raider M.D.   On: 10/15/2020 17:23   CT CORONARY MORPH W/CTA COR W/SCORE W/CA W/CM &/OR WO/CM  Addendum Date: 10/23/2020   ADDENDUM REPORT: 10/23/2020 21:30 HISTORY: Chest pain/anginal equiv, 64yr CHD risk 10-20%, not treadmill candidate EXAM: Cardiac/Coronary  CT TECHNIQUE: The patient was scanned on a [employer]. PROTOCOL: A 120 kV prospective scan was triggered in the descending thoracic aorta at 111 HU's. Axial non-contrast 3 mm slices were carried out through the heart. The data set was analyzed on a dedicated work station and scored using the Agatson method. Gantry rotation speed was 250 msecs and collimation was .6 mm. No IV beta blockade but 0.8 mg of sl NTG was given. The 3D data set was reconstructed in 5% intervals of the 67-82 % of the R-R cycle. Diastolic phases were analyzed on a dedicated work station using MPR, MIP and VRT modes. The patient received 80mL OMNIPAQUE IOHEXOL 350 MG/ML SOLN of contrast. FINDINGS: Image quality: Excellent. Noise artifact is: Limited. Coronary artery calcification score: Total coronary calcium score is 0. Coronary arteries: Normal coronary origins.  Right dominance. Left Main Coronary Artery: The left main is a large caliber vessel with a normal take off from the left coronary cusp that bifurcates to form a left anterior descending artery and a left circumflex artery. There is no plaque or stenosis. Left Anterior Descending Coronary Artery: The LAD gives off 2 patent diagonal branches. The LAD is patent without evidence of plaque or stenosis. Left Circumflex Artery: The LCX is non-dominant vessel that courses the AV groove and gives off 2 patent obtuse marginal branches. The LCX is patent with no evidence of plaque or stenosis. Right Coronary Artery: The RCA is dominant with normal take off from the right coronary cusp. The RCA terminates as a PDA and right posterolateral branch without evidence of plaque or  stenosis. Left Atrium: The pulmonary veins drain into the left atrium, grossly normal in size, and without left atrial or atrial appendage filling defect. Left Ventricle: The ventricular cavity size is grossly within normal limits. No evidence of ventricular septal defect. There is no abnormal filling defect. Pulmonary arteries: Main pulmonary artery is at the upper limit of normal, 29mm. No proximal filling defect. Pulmonary veins: Normal pulmonary venous drainage. Aorta: Normal size, 36 mm at the mid ascending aorta (level of the PA bifurcation) measured double oblique. No calcifications. No dissection. Pericardium: Normal thickness with no significant effusion or calcium present. Cardiac valves: The aortic valve is trileaflet without calcification. The mitral valve is normal structure without calcification. Extra-cardiac findings: See attached radiology report for non-cardiac structures. IMPRESSION: 1. Coronary calcium score of 0. 2. Normal coronary origin with right dominance. 3. Main pulmonary artery is at the upper limit of normal, 29mm. 3. CAD-RADS = 0. RECOMMENDATIONS: No evidence of CAD (0%). Consider non-atherosclerotic causes of chest pain. Electronically Signed   By: Tessa Lerner   On: 10/23/2020 21:30   Result Date: 10/23/2020 EXAM: OVER-READ INTERPRETATION  CT CHEST The following report is an over-read performed by radiologist Dr. Genevive Bi of [facility] on 10/22/2020. This  over-read does not include interpretation of cardiac or coronary anatomy or pathology. The coronary CTA interpretation by the cardiologist is attached. COMPARISON:  None. FINDINGS: Limited view of the lung parenchyma demonstrates no suspicious nodularity. Airways are normal. Limited view of the mediastinum demonstrates no adenopathy. Esophagus normal. Limited view of the upper abdomen unremarkable. Limited view of the skeleton and chest wall is unremarkable. IMPRESSION: No significant extracardiac findings.  Electronically Signed: By: Genevive Bi M.D. On: 10/22/2020 09:10   DG Chest Port 1 View  Result Date: 10/15/2020 CLINICAL DATA:  Altered mental status.  Episode of apnea EXAM: PORTABLE CHEST 1 VIEW COMPARISON:  September 13, 2020 FINDINGS: There is no edema or airspace opacity. Heart is slightly enlarged with pulmonary vascularity normal. No adenopathy. No pneumothorax. No bone lesions. IMPRESSION: Slight cardiac prominence.  No edema or airspace opacity. Electronically Signed   By: Bretta Bang III M.D.   On: 10/15/2020 14:57    Microbiology: Recent Results (from the past 240 hour(s))  Resp Panel by RT-PCR (Flu A&B, Covid) Nasopharyngeal Swab     Status: None   Collection Time: 10/31/20  8:48 PM   Specimen: Nasopharyngeal Swab; Nasopharyngeal(NP) swabs in vial transport medium  Result Value Ref Range Status   SARS Coronavirus 2 by RT PCR NEGATIVE NEGATIVE Final    Comment: (NOTE) SARS-CoV-2 target nucleic acids are NOT DETECTED.  The SARS-CoV-2 RNA is generally detectable in upper respiratory specimens during the acute phase of infection. The lowest concentration of SARS-CoV-2 viral copies this assay can detect is 138 copies/mL. A negative result does not preclude SARS-Cov-2 infection and should not be used as the sole basis for treatment or other patient management decisions. A negative result may occur with  improper specimen collection/handling, submission of specimen other than nasopharyngeal swab, presence of viral mutation(s) within the areas targeted by this assay, and inadequate number of viral copies(<138 copies/mL). A negative result must be combined with clinical observations, patient history, and epidemiological information. The expected result is Negative.  Fact Sheet for Patients:  BloggerCourse.com  Fact Sheet for Healthcare Providers:  SeriousBroker.it  This test is no t yet approved or cleared by the [place] FDA and  has been authorized for detection and/or diagnosis of SARS-CoV-2 by FDA under an Emergency Use Authorization (EUA). This EUA will remain  in effect (meaning this test can be used) for the duration of the COVID-19 declaration under Section 564(b)(1) of the Act, 21 U.S.C.section 360bbb-3(b)(1), unless the authorization is terminated  or revoked sooner.       Influenza A by PCR NEGATIVE NEGATIVE Final   Influenza B by PCR NEGATIVE NEGATIVE Final    Comment: (NOTE) The Xpert Xpress SARS-CoV-2/FLU/RSV plus assay is intended as an aid in the diagnosis of influenza from Nasopharyngeal swab specimens and should not be used as a sole basis for treatment. Nasal washings and aspirates are unacceptable for Xpert Xpress SARS-CoV-2/FLU/RSV testing.  Fact Sheet for Patients: BloggerCourse.com  Fact Sheet for Healthcare Providers: SeriousBroker.it  This test is not yet approved or cleared by the [place] FDA and has been authorized for detection and/or diagnosis of SARS-CoV-2 by FDA under an Emergency Use Authorization (EUA). This EUA will remain in effect (meaning this test can be used) for the duration of the COVID-19 declaration under Section 564(b)(1) of the Act, 21 U.S.C. section 360bbb-3(b)(1), unless the authorization is terminated or revoked.  Performed at [facility], [address], [place], [place] [address]  Labs: Basic Metabolic Panel: Recent Labs  Lab 10/31/20 1802 11/01/20 0216  NA 139 142  K 4.3 4.5  CL 106 108  CO2 22 20*  GLUCOSE 167* 141*  BUN 19 19  CREATININE 1.68* 1.58*  CALCIUM 9.8 9.8   Liver Function Tests: Recent Labs  Lab 10/31/20 1802 11/01/20 0216  AST 33 28  ALT 40 38  ALKPHOS 69 64  BILITOT 0.4 0.4  PROT 7.2 6.6  ALBUMIN 4.4 3.9   No results for input(s): LIPASE, AMYLASE in the last 168 hours. No results for input(s): AMMONIA in the last 168 hours. CBC:  Recent Labs  Lab 10/31/20 1802 10/31/20 2324 11/01/20 0833 11/01/20 1615  WBC 6.8 5.7 5.2 5.3  HGB 13.5 13.5 13.4 13.6  HCT 43.1 43.1 42.3 44.8  MCV 88.0 87.6 87.2 89.4  PLT 173 151 185 158   Cardiac Enzymes: No results for input(s): CKTOTAL, CKMB, CKMBINDEX, TROPONINI in the last 168 hours. BNP: BNP (last 3 results) No results for input(s): BNP in the last 8760 hours.  ProBNP (last 3 results) No results for input(s): PROBNP in the last 8760 hours.  CBG: Recent Labs  Lab 11/01/20 1146 11/01/20 1700 11/01/20 1957 11/02/20 0823 11/02/20 1158  GLUCAP 126* 138* 106* 115* 131*    Principal Problem:   Rectal bleeding Active Problems:   Insulin-requiring or dependent type II diabetes mellitus (HCC)   Essential hypertension   AKI (acute kidney injury) (HCC)   Time coordinating discharge: 38 minutes  Signed:        Shane Melby, DO [facility]  11/02/2020, 5:04 PM

## 2020-11-02 NOTE — Op Note (Signed)
[facility] Patient Name: Nicholas [name] Neeb Procedure Date : 11/02/2020 MRN: [id number] Attending MD: Willis ModenaWilliam Edin Kon , MD Date of Birth: 1961-08-25 CSN: [id number] Age: 59 Admit Type: Inpatient Procedure:                Colonoscopy Indications:              Hematochezia Providers:                Willis ModenaWilliam Hosanna Betley, MD, Glory RosebushJulie Gibson, RN, Lawson Radararlene                            Davis, Technician, Dairl PonderFudan Jiang, CRNA Referring MD:             [facility] Medicines:                Monitored Anesthesia Care Complications:            No immediate complications. Estimated Blood Loss:     Estimated blood loss: none. Procedure:                Pre-Anesthesia Assessment:                           - Prior to the procedure, a History and Physical                            was performed, and patient medications and                            allergies were reviewed. The patient's tolerance of                            previous anesthesia was also reviewed. The risks                            and benefits of the procedure and the sedation                            options and risks were discussed with the patient.                            All questions were answered, and informed consent                            was obtained. Prior Anticoagulants: The patient has                            taken Plavix (clopidogrel), last dose was 2 days                            prior to procedure. ASA Grade Assessment: III - A                            patient with severe systemic disease. After  reviewing the risks and benefits, the patient was                            deemed in satisfactory condition to undergo the                            procedure.                           After obtaining informed consent, the colonoscope                            was passed under direct vision. Throughout the                            procedure, the patient's blood pressure,  pulse, and                            oxygen saturations were monitored continuously. The                            CF-HQ190L ([id number] Olympus colonoscope was                            introduced through the anus and advanced to the the                            cecum, identified by appendiceal orifice and                            ileocecal valve. The ileocecal valve, appendiceal                            orifice, and rectum were photographed. The entire                            colon was examined. The colonoscopy was performed                            without difficulty. The patient tolerated the                            procedure well. The quality of the bowel                            preparation was good. Scope In: 10:48:59 AM Scope Out: 11:01:09 AM Scope Withdrawal Time: 0 hours 4 minutes 26 seconds  Total Procedure Duration: 0 hours 12 minutes 10 seconds  Findings:      Small hemorrhoids were found on perianal exam.      Fibrotic changes at anorectal junction, seen in retroflexion, most       consistent with prior hemorrhoidal or anal sphincter surgery. No       additional abnormalities were found on retroflexion.      A few small-mouthed diverticula were found in the descending colon  and       transverse colon.      Colon otherwise normal; no other polyps, masses, vascular ectasias, or       inflammatory changes were seen.      No old or fresh blood was seen to the extent of our examination. Impression:               - Hemorrhoids found on perianal exam.                           - Diverticulosis in the descending colon and in the                            transverse colon.                           - No blood seen.                           - Suspect blood most likely from perianal                            irritation/hemorrhoids. No active bleeding seen at                            this time. Moderate Sedation:      None Recommendation:           - Return  patient to hospital ward for ongoing care.                           - Soft diet today.                           - Continue present medications.                           - Hold Plavix for another 5 days, if possible.                           - Ok for patient to be discharged home from GI                            perspective.                           [facility] will sign-off; patient should follow-up                            with his gastroenterologist/surgeon (Dr. Micah Noel,                            Sandre Kitty) for ongoing GI care; please call with                            any questions; thank you for the consultation. Procedure Code(s):        --- Professional ---  73532, Colonoscopy, flexible; diagnostic, including                            collection of specimen(s) by brushing or washing,                            when performed (separate procedure) Diagnosis Code(s):        --- Professional ---                           K64.9, Unspecified hemorrhoids                           K92.1, Melena (includes Hematochezia)                           K57.30, Diverticulosis of large intestine without                            perforation or abscess without bleeding CPT copyright 2019 [employer]. All rights reserved. The codes documented in this report are preliminary and upon coder review may  be revised to meet current compliance requirements. Willis Modena, MD 11/02/2020 11:08:14 AM This report has been signed electronically. Number of Addenda: 0

## 2020-11-02 NOTE — Plan of Care (Signed)
  Problem: Education: Goal: Ability to identify signs and symptoms of gastrointestinal bleeding will improve Outcome: Progressing   Problem: Education: Goal: Knowledge of General Education information will improve Description: Including pain rating scale, medication(s)/side effects and non-pharmacologic comfort measures Outcome: Progressing   Problem: Clinical Measurements: Goal: Diagnostic test results will improve Outcome: Progressing   Problem: Elimination: Goal: Will not experience complications related to bowel motility Outcome: Progressing Goal: Will not experience complications related to urinary retention Outcome: Progressing   Problem: Pain Managment: Goal: General experience of comfort will improve Outcome: Progressing   Problem: Safety: Goal: Ability to remain free from injury will improve Outcome: Progressing   Problem: Skin Integrity: Goal: Risk for impaired skin integrity will decrease Outcome: Progressing

## 2020-11-03 ENCOUNTER — Encounter (HOSPITAL_COMMUNITY): Payer: Self-pay | Admitting: Gastroenterology

## 2020-11-07 NOTE — Anesthesia Postprocedure Evaluation (Signed)
Anesthesia Post Note  Patient: Nicholas [name]  Procedure(s) Performed: COLONOSCOPY WITH PROPOFOL (Left )     Patient location during evaluation: Endoscopy Anesthesia Type: MAC Level of consciousness: awake and alert Pain management: pain level controlled Vital Signs Assessment: post-procedure vital signs reviewed and stable Respiratory status: spontaneous breathing, nonlabored ventilation, respiratory function stable and patient connected to nasal cannula oxygen Cardiovascular status: stable and blood pressure returned to baseline Postop Assessment: no apparent nausea or vomiting Anesthetic complications: no   No complications documented.  Last Vitals:  Vitals:   11/02/20 1152 11/02/20 1531  BP: 99/75 114/67  Pulse: 62 63  Resp: 20 14  Temp: 36.5 C (!) 36.4 C  SpO2: 99% 96%    Last Pain:  Vitals:   11/02/20 1531  TempSrc: Oral  PainSc:                  Shemaiah Round

## 2020-12-03 ENCOUNTER — Emergency Department (HOSPITAL_COMMUNITY): Payer: Medicare Other

## 2020-12-03 ENCOUNTER — Inpatient Hospital Stay (HOSPITAL_COMMUNITY): Payer: Medicare Other

## 2020-12-03 ENCOUNTER — Other Ambulatory Visit: Payer: Self-pay

## 2020-12-03 ENCOUNTER — Inpatient Hospital Stay (HOSPITAL_COMMUNITY)
Admission: EM | Admit: 2020-12-03 | Discharge: 2020-12-06 | DRG: 100 | Disposition: A | Discharge status: Home Health | Payer: Medicare Other | Attending: Internal Medicine | Admitting: Internal Medicine

## 2020-12-03 DIAGNOSIS — Z0189 Encounter for other specified special examinations: Secondary | ICD-10-CM

## 2020-12-03 DIAGNOSIS — Z8711 Personal history of peptic ulcer disease: Secondary | ICD-10-CM | POA: Diagnosis not present

## 2020-12-03 DIAGNOSIS — F319 Bipolar disorder, unspecified: Secondary | ICD-10-CM | POA: Diagnosis present

## 2020-12-03 DIAGNOSIS — G8929 Other chronic pain: Secondary | ICD-10-CM | POA: Diagnosis present

## 2020-12-03 DIAGNOSIS — Z794 Long term (current) use of insulin: Secondary | ICD-10-CM | POA: Diagnosis not present

## 2020-12-03 DIAGNOSIS — I129 Hypertensive chronic kidney disease with stage 1 through stage 4 chronic kidney disease, or unspecified chronic kidney disease: Secondary | ICD-10-CM | POA: Diagnosis present

## 2020-12-03 DIAGNOSIS — Z8249 Family history of ischemic heart disease and other diseases of the circulatory system: Secondary | ICD-10-CM

## 2020-12-03 DIAGNOSIS — Z888 Allergy status to other drugs, medicaments and biological substances status: Secondary | ICD-10-CM

## 2020-12-03 DIAGNOSIS — E872 Acidosis: Secondary | ICD-10-CM | POA: Diagnosis present

## 2020-12-03 DIAGNOSIS — G43909 Migraine, unspecified, not intractable, without status migrainosus: Secondary | ICD-10-CM | POA: Diagnosis present

## 2020-12-03 DIAGNOSIS — I429 Cardiomyopathy, unspecified: Secondary | ICD-10-CM | POA: Diagnosis present

## 2020-12-03 DIAGNOSIS — Z87892 Personal history of anaphylaxis: Secondary | ICD-10-CM

## 2020-12-03 DIAGNOSIS — Z7984 Long term (current) use of oral hypoglycemic drugs: Secondary | ICD-10-CM

## 2020-12-03 DIAGNOSIS — Z7902 Long term (current) use of antithrombotics/antiplatelets: Secondary | ICD-10-CM

## 2020-12-03 DIAGNOSIS — R4182 Altered mental status, unspecified: Secondary | ICD-10-CM

## 2020-12-03 DIAGNOSIS — E785 Hyperlipidemia, unspecified: Secondary | ICD-10-CM | POA: Diagnosis present

## 2020-12-03 DIAGNOSIS — N1831 Chronic kidney disease, stage 3a: Secondary | ICD-10-CM | POA: Diagnosis present

## 2020-12-03 DIAGNOSIS — J45909 Unspecified asthma, uncomplicated: Secondary | ICD-10-CM | POA: Diagnosis present

## 2020-12-03 DIAGNOSIS — Z833 Family history of diabetes mellitus: Secondary | ICD-10-CM

## 2020-12-03 DIAGNOSIS — Z981 Arthrodesis status: Secondary | ICD-10-CM

## 2020-12-03 DIAGNOSIS — Z87891 Personal history of nicotine dependence: Secondary | ICD-10-CM

## 2020-12-03 DIAGNOSIS — Z79899 Other long term (current) drug therapy: Secondary | ICD-10-CM | POA: Diagnosis not present

## 2020-12-03 DIAGNOSIS — K219 Gastro-esophageal reflux disease without esophagitis: Secondary | ICD-10-CM | POA: Diagnosis present

## 2020-12-03 DIAGNOSIS — J9601 Acute respiratory failure with hypoxia: Secondary | ICD-10-CM | POA: Diagnosis not present

## 2020-12-03 DIAGNOSIS — Z91018 Allergy to other foods: Secondary | ICD-10-CM

## 2020-12-03 DIAGNOSIS — E1122 Type 2 diabetes mellitus with diabetic chronic kidney disease: Secondary | ICD-10-CM | POA: Diagnosis present

## 2020-12-03 DIAGNOSIS — R569 Unspecified convulsions: Secondary | ICD-10-CM | POA: Diagnosis present

## 2020-12-03 DIAGNOSIS — Z7951 Long term (current) use of inhaled steroids: Secondary | ICD-10-CM

## 2020-12-03 DIAGNOSIS — Z8673 Personal history of transient ischemic attack (TIA), and cerebral infarction without residual deficits: Secondary | ICD-10-CM | POA: Diagnosis not present

## 2020-12-03 DIAGNOSIS — Z20822 Contact with and (suspected) exposure to covid-19: Secondary | ICD-10-CM | POA: Diagnosis present

## 2020-12-03 DIAGNOSIS — Z882 Allergy status to sulfonamides status: Secondary | ICD-10-CM

## 2020-12-03 DIAGNOSIS — J9600 Acute respiratory failure, unspecified whether with hypoxia or hypercapnia: Secondary | ICD-10-CM | POA: Diagnosis not present

## 2020-12-03 DIAGNOSIS — Z841 Family history of disorders of kidney and ureter: Secondary | ICD-10-CM

## 2020-12-03 DIAGNOSIS — T424X5A Adverse effect of benzodiazepines, initial encounter: Secondary | ICD-10-CM | POA: Diagnosis not present

## 2020-12-03 DIAGNOSIS — Z9911 Dependence on respirator [ventilator] status: Secondary | ICD-10-CM

## 2020-12-03 DIAGNOSIS — Z880 Allergy status to penicillin: Secondary | ICD-10-CM

## 2020-12-03 LAB — COMPREHENSIVE METABOLIC PANEL
ALT: 23 U/L (ref 0–44)
AST: 22 U/L (ref 15–41)
Albumin: 3.3 g/dL — ABNORMAL LOW (ref 3.5–5.0)
Alkaline Phosphatase: 48 U/L (ref 38–126)
Anion gap: 9 (ref 5–15)
BUN: 18 mg/dL (ref 6–20)
CO2: 20 mmol/L — ABNORMAL LOW (ref 22–32)
Calcium: 8.3 mg/dL — ABNORMAL LOW (ref 8.9–10.3)
Chloride: 111 mmol/L (ref 98–111)
Creatinine, Ser: 1.45 mg/dL — ABNORMAL HIGH (ref 0.61–1.24)
GFR, Estimated: 56 mL/min — ABNORMAL LOW (ref >60)
Glucose, Bld: 96 mg/dL (ref 70–99)
Potassium: 3.7 mmol/L (ref 3.5–5.1)
Sodium: 140 mmol/L (ref 135–145)
Total Bilirubin: 0.2 mg/dL — ABNORMAL LOW (ref 0.3–1.2)
Total Protein: 5.7 g/dL — ABNORMAL LOW (ref 6.5–8.1)

## 2020-12-03 LAB — URINALYSIS, COMPLETE (UACMP) WITH MICROSCOPIC
Bacteria, UA: NONE SEEN
Bilirubin Urine: NEGATIVE
Glucose, UA: >=500 mg/dL — AB
Ketones, ur: NEGATIVE mg/dL
Leukocytes,Ua: NEGATIVE
Nitrite: NEGATIVE
Protein, ur: NEGATIVE mg/dL
Specific Gravity, Urine: 1.018 (ref 1.005–1.030)
pH: 6 (ref 5.0–8.0)

## 2020-12-03 LAB — CBC WITH DIFFERENTIAL/PLATELET
Abs Immature Granulocytes: 0.03 10*3/uL (ref 0.00–0.07)
Basophils Absolute: 0 10*3/uL (ref 0.0–0.1)
Basophils Relative: 1 %
Eosinophils Absolute: 0.1 10*3/uL (ref 0.0–0.5)
Eosinophils Relative: 2 %
HCT: 39.2 % (ref 39.0–52.0)
Hemoglobin: 12.1 g/dL — ABNORMAL LOW (ref 13.0–17.0)
Immature Granulocytes: 1 %
Lymphocytes Relative: 33 %
Lymphs Abs: 1.9 10*3/uL (ref 0.7–4.0)
MCH: 27.3 pg (ref 26.0–34.0)
MCHC: 30.9 g/dL (ref 30.0–36.0)
MCV: 88.3 fL (ref 80.0–100.0)
Monocytes Absolute: 0.4 10*3/uL (ref 0.1–1.0)
Monocytes Relative: 7 %
Neutro Abs: 3.2 10*3/uL (ref 1.7–7.7)
Neutrophils Relative %: 56 %
Platelets: 171 10*3/uL (ref 150–400)
RBC: 4.44 MIL/uL (ref 4.22–5.81)
RDW: 14.6 % (ref 11.5–15.5)
WBC: 5.6 10*3/uL (ref 4.0–10.5)
nRBC: 0 % (ref 0.0–0.2)

## 2020-12-03 LAB — I-STAT CHEM 8, ED
BUN: 20 mg/dL (ref 6–20)
Calcium, Ion: 1.16 mmol/L (ref 1.15–1.40)
Chloride: 109 mmol/L (ref 98–111)
Creatinine, Ser: 1.3 mg/dL — ABNORMAL HIGH (ref 0.61–1.24)
Glucose, Bld: 89 mg/dL (ref 70–99)
HCT: 37 % — ABNORMAL LOW (ref 39.0–52.0)
Hemoglobin: 12.6 g/dL — ABNORMAL LOW (ref 13.0–17.0)
Potassium: 3.5 mmol/L (ref 3.5–5.1)
Sodium: 142 mmol/L (ref 135–145)
TCO2: 20 mmol/L — ABNORMAL LOW (ref 22–32)

## 2020-12-03 LAB — I-STAT ARTERIAL BLOOD GAS, ED
Acid-base deficit: 6 mmol/L — ABNORMAL HIGH (ref 0.0–2.0)
Acid-base deficit: 4 mmol/L — ABNORMAL HIGH (ref 0.0–2.0)
Bicarbonate: 21.9 mmol/L (ref 20.0–28.0)
Bicarbonate: 22.1 mmol/L (ref 20.0–28.0)
Calcium, Ion: 1.31 mmol/L (ref 1.15–1.40)
Calcium, Ion: 1.26 mmol/L (ref 1.15–1.40)
HCT: 37 % — ABNORMAL LOW (ref 39.0–52.0)
HCT: 36 % — ABNORMAL LOW (ref 39.0–52.0)
Hemoglobin: 12.6 g/dL — ABNORMAL LOW (ref 13.0–17.0)
Hemoglobin: 12.2 g/dL — ABNORMAL LOW (ref 13.0–17.0)
O2 Saturation: 96 %
O2 Saturation: 100 %
Potassium: 4.4 mmol/L (ref 3.5–5.1)
Potassium: 4.6 mmol/L (ref 3.5–5.1)
Sodium: 141 mmol/L (ref 135–145)
Sodium: 141 mmol/L (ref 135–145)
TCO2: 24 mmol/L (ref 22–32)
TCO2: 23 mmol/L (ref 22–32)
pCO2 arterial: 52.8 mmHg — ABNORMAL HIGH (ref 32.0–48.0)
pCO2 arterial: 42.5 mmHg (ref 32.0–48.0)
pH, Arterial: 7.226 — ABNORMAL LOW (ref 7.350–7.450)
pH, Arterial: 7.324 — ABNORMAL LOW (ref 7.350–7.450)
pO2, Arterial: 100 mmHg (ref 83.0–108.0)
pO2, Arterial: 292 mmHg — ABNORMAL HIGH (ref 83.0–108.0)

## 2020-12-03 LAB — RAPID URINE DRUG SCREEN, HOSP PERFORMED
Amphetamines: NOT DETECTED
Barbiturates: NOT DETECTED
Benzodiazepines: POSITIVE — AB
Cocaine: NOT DETECTED
Opiates: NOT DETECTED
Tetrahydrocannabinol: NOT DETECTED

## 2020-12-03 LAB — RESP PANEL BY RT-PCR (FLU A&B, COVID) ARPGX2
Influenza A by PCR: NEGATIVE
Influenza B by PCR: NEGATIVE
SARS Coronavirus 2 by RT PCR: NEGATIVE

## 2020-12-03 LAB — GLUCOSE, CAPILLARY
Glucose-Capillary: 100 mg/dL — ABNORMAL HIGH (ref 70–99)
Glucose-Capillary: 128 mg/dL — ABNORMAL HIGH (ref 70–99)

## 2020-12-03 LAB — AMMONIA: Ammonia: 41 umol/L — ABNORMAL HIGH (ref 9–35)

## 2020-12-03 LAB — ETHANOL: Alcohol, Ethyl (B): <10 mg/dL (ref <10)

## 2020-12-03 LAB — HEMOGLOBIN A1C
Hgb A1c MFr Bld: 8 % — ABNORMAL HIGH (ref 4.8–5.6)
Mean Plasma Glucose: 182.9 mg/dL

## 2020-12-03 LAB — LACTIC ACID, PLASMA
Lactic Acid, Venous: 2.4 mmol/L (ref 0.5–1.9)
Lactic Acid, Venous: 2.2 mmol/L (ref 0.5–1.9)

## 2020-12-03 LAB — PROTIME-INR
INR: 1.4 — ABNORMAL HIGH (ref 0.8–1.2)
Prothrombin Time: 16.5 seconds — ABNORMAL HIGH (ref 11.4–15.2)

## 2020-12-03 LAB — TRIGLYCERIDES: Triglycerides: 186 mg/dL — ABNORMAL HIGH (ref <150)

## 2020-12-03 MED ORDER — MIDAZOLAM HCL 2 MG/2ML IJ SOLN
INTRAMUSCULAR | Status: AC
  Filled 2020-12-03: qty 4

## 2020-12-03 MED ORDER — ARFORMOTEROL TARTRATE 15 MCG/2ML IN NEBU
15.0000 ug | INHALATION_SOLUTION | Freq: Two times a day (BID) | RESPIRATORY_TRACT | Status: DC
  Administered 2020-12-04: 15 ug via RESPIRATORY_TRACT
  Filled 2020-12-03 (×2): qty 2

## 2020-12-03 MED ORDER — NALOXONE HCL 0.4 MG/ML IJ SOLN
INTRAMUSCULAR | Status: AC
  Filled 2020-12-03: qty 1

## 2020-12-03 MED ORDER — ATORVASTATIN CALCIUM 80 MG PO TABS: 80.0000 mg | ORAL_TABLET | Freq: Every day | ORAL | Status: DC

## 2020-12-03 MED ORDER — ROCURONIUM BROMIDE 10 MG/ML (PF) SYRINGE
PREFILLED_SYRINGE | INTRAVENOUS | Status: AC
  Filled 2020-12-03: qty 10

## 2020-12-03 MED ORDER — DOCUSATE SODIUM 100 MG PO CAPS: 100.0000 mg | ORAL_CAPSULE | Freq: Two times a day (BID) | ORAL | Status: DC | PRN

## 2020-12-03 MED ORDER — INSULIN ASPART 100 UNIT/ML ~~LOC~~ SOLN
0.0000 [IU] | SUBCUTANEOUS | Status: DC
  Administered 2020-12-03: 2 [IU] via SUBCUTANEOUS
  Administered 2020-12-04: 5 [IU] via SUBCUTANEOUS
  Administered 2020-12-04: 2 [IU] via SUBCUTANEOUS
  Administered 2020-12-04: 8 [IU] via SUBCUTANEOUS
  Administered 2020-12-04: 3 [IU] via SUBCUTANEOUS
  Administered 2020-12-05: 5 [IU] via SUBCUTANEOUS
  Administered 2020-12-05 (×4): 3 [IU] via SUBCUTANEOUS
  Administered 2020-12-06: 8 [IU] via SUBCUTANEOUS

## 2020-12-03 MED ORDER — CHLORHEXIDINE GLUCONATE CLOTH 2 % EX PADS
6.0000 | MEDICATED_PAD | Freq: Every day | CUTANEOUS | Status: DC
  Administered 2020-12-05: 6 via TOPICAL

## 2020-12-03 MED ORDER — HEPARIN SODIUM (PORCINE) 5000 UNIT/ML IJ SOLN
5000.0000 [IU] | Freq: Three times a day (TID) | INTRAMUSCULAR | Status: DC
  Administered 2020-12-03 – 2020-12-06 (×8): 5000 [IU] via SUBCUTANEOUS
  Filled 2020-12-03 (×8): qty 1

## 2020-12-03 MED ORDER — FENTANYL CITRATE (PF) 100 MCG/2ML IJ SOLN: 50.0000 ug | INTRAMUSCULAR | Status: DC | PRN

## 2020-12-03 MED ORDER — ORAL CARE MOUTH RINSE
15.0000 mL | OROMUCOSAL | Status: DC
  Administered 2020-12-03 – 2020-12-04 (×6): 15 mL via OROMUCOSAL

## 2020-12-03 MED ORDER — SODIUM CHLORIDE 0.9 % IV BOLUS
1000.0000 mL | Freq: Once | INTRAVENOUS | Status: AC
  Administered 2020-12-03: 1000 mL via INTRAVENOUS

## 2020-12-03 MED ORDER — MIDAZOLAM HCL 5 MG/5ML IJ SOLN
INTRAMUSCULAR | Status: AC | PRN
  Administered 2020-12-03: 4 mg via INTRAVENOUS

## 2020-12-03 MED ORDER — LEVETIRACETAM IN NACL 1500 MG/100ML IV SOLN
3000.0000 mg | Freq: Once | INTRAVENOUS | Status: AC
  Administered 2020-12-03: 3000 mg via INTRAVENOUS
  Filled 2020-12-03: qty 200

## 2020-12-03 MED ORDER — PROPOFOL 1000 MG/100ML IV EMUL
0.0000 ug/kg/min | INTRAVENOUS | Status: DC
  Administered 2020-12-03: 50 ug/kg/min via INTRAVENOUS
  Administered 2020-12-04: 25 ug/kg/min via INTRAVENOUS
  Filled 2020-12-03: qty 200
  Filled 2020-12-03: qty 100

## 2020-12-03 MED ORDER — ETOMIDATE 2 MG/ML IV SOLN
INTRAVENOUS | Status: AC
  Filled 2020-12-03: qty 20

## 2020-12-03 MED ORDER — MIDAZOLAM HCL 2 MG/2ML IJ SOLN
INTRAMUSCULAR | Status: AC
  Filled 2020-12-03: qty 2

## 2020-12-03 MED ORDER — POLYETHYLENE GLYCOL 3350 17 G PO PACK: 17.0000 g | PACK | Freq: Every day | ORAL | Status: DC | PRN

## 2020-12-03 MED ORDER — ALBUTEROL SULFATE (2.5 MG/3ML) 0.083% IN NEBU
2.5000 mg | INHALATION_SOLUTION | Freq: Four times a day (QID) | RESPIRATORY_TRACT | Status: DC | PRN
  Filled 2020-12-03: qty 3

## 2020-12-03 MED ORDER — DOCUSATE SODIUM 50 MG/5ML PO LIQD
100.0000 mg | Freq: Two times a day (BID) | ORAL | Status: DC
  Administered 2020-12-03: 100 mg
  Filled 2020-12-03: qty 10

## 2020-12-03 MED ORDER — SUCCINYLCHOLINE CHLORIDE 200 MG/10ML IV SOSY
PREFILLED_SYRINGE | INTRAVENOUS | Status: AC
  Filled 2020-12-03: qty 10

## 2020-12-03 MED ORDER — LEVETIRACETAM IN NACL 1000 MG/100ML IV SOLN
1000.0000 mg | Freq: Two times a day (BID) | INTRAVENOUS | Status: DC
  Administered 2020-12-04 – 2020-12-06 (×5): 1000 mg via INTRAVENOUS
  Filled 2020-12-03 (×5): qty 100

## 2020-12-03 MED ORDER — PROPOFOL 1000 MG/100ML IV EMUL
INTRAVENOUS | Status: AC
  Administered 2020-12-03: 30 ug/kg/min
  Filled 2020-12-03: qty 100

## 2020-12-03 MED ORDER — INSULIN GLARGINE 100 UNIT/ML ~~LOC~~ SOLN
10.0000 [IU] | Freq: Every day | SUBCUTANEOUS | Status: DC
  Administered 2020-12-03 – 2020-12-06 (×4): 10 [IU] via SUBCUTANEOUS
  Filled 2020-12-03 (×4): qty 0.1

## 2020-12-03 MED ORDER — MIDAZOLAM HCL 2 MG/2ML IJ SOLN
2.0000 mg | INTRAMUSCULAR | Status: DC | PRN
  Filled 2020-12-03: qty 2

## 2020-12-03 MED ORDER — SUCCINYLCHOLINE CHLORIDE 20 MG/ML IJ SOLN
INTRAMUSCULAR | Status: AC | PRN
  Administered 2020-12-03: 100 mg via INTRAVENOUS

## 2020-12-03 MED ORDER — POLYETHYLENE GLYCOL 3350 17 G PO PACK: 17.0000 g | PACK | Freq: Every day | ORAL | Status: DC

## 2020-12-03 MED ORDER — MIDAZOLAM HCL 2 MG/2ML IJ SOLN
2.0000 mg | INTRAMUSCULAR | Status: DC | PRN
  Administered 2020-12-03 (×2): 2 mg via INTRAVENOUS
  Filled 2020-12-03: qty 2

## 2020-12-03 MED ORDER — BUDESONIDE 0.5 MG/2ML IN SUSP
0.5000 mg | Freq: Two times a day (BID) | RESPIRATORY_TRACT | Status: DC
  Administered 2020-12-03 – 2020-12-04 (×2): 0.5 mg via RESPIRATORY_TRACT
  Filled 2020-12-03 (×3): qty 2

## 2020-12-03 MED ORDER — PANTOPRAZOLE SODIUM 40 MG IV SOLR
40.0000 mg | Freq: Every day | INTRAVENOUS | Status: DC
  Administered 2020-12-03: 40 mg via INTRAVENOUS
  Filled 2020-12-03: qty 40

## 2020-12-03 MED ORDER — CLOPIDOGREL BISULFATE 75 MG PO TABS: 75.0000 mg | ORAL_TABLET | Freq: Every day | ORAL | Status: DC

## 2020-12-03 MED ORDER — ETOMIDATE 2 MG/ML IV SOLN
INTRAVENOUS | Status: AC | PRN
  Administered 2020-12-03: 20 mg via INTRAVENOUS

## 2020-12-03 MED ORDER — SODIUM CHLORIDE 0.9 % IV SOLN: INTRAVENOUS | Status: DC

## 2020-12-03 MED ORDER — CHLORHEXIDINE GLUCONATE 0.12% ORAL RINSE (MEDLINE KIT)
15.0000 mL | Freq: Two times a day (BID) | OROMUCOSAL | Status: DC
  Administered 2020-12-03 – 2020-12-04 (×2): 15 mL via OROMUCOSAL

## 2020-12-03 NOTE — ED Triage Notes (Signed)
Pt called EMS for eval of hernia/related shob. When he stood to get to the EMS stretcher, had syncopal episode and was completely unresponsive. Then had full body seizure activity, lasting approx 1 minute. 2.5 mg versed given. Pt remains lethargic, unable to answer questions.

## 2020-12-03 NOTE — ED Provider Notes (Addendum)
[name] EMERGENCY DEPARTMENT Provider Note   CSN: [id number] Arrival date & time: 12/03/20  1324     History Chief Complaint  Patient presents with  . Seizures  . Altered Mental Status    Nicholas [name] is a 59 y.o. male.  HPI Level 5 caveat secondary to urgent need for intervention intervention and altered mental status   59 year old male history of seizure disorder, asthma, bipolar disorder, chronic pain, presents today via EMS with altered mental status.  EMS reports that they were called out for hernia.  They state that when they got there the patient had a syncopal episode and was unresponsive.  He began to be somewhat responsive and then had a grand mal seizure.  He received Versed 2.5 mg prehospital and has continued to be unresponsive with sonorous respirations in route Blood sugar prehospital 105 Past Medical History:  Diagnosis Date  . Asthma   . Bipolar 1 disorder (HCC)   . Borderline glaucoma   . Chronic pain   . Epididymal pain    LEFT  . 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 Feeling of incomplete bladder emptying   . Frequency of urination   . Gastric ulcer   . GERD (gastroesophageal reflux disease)   . Hypertension   . Hyperthyroidism    NO MEDS   . Migraine   . Seizures (HCC)   . TIA (transient ischemic attack)   . Type 2 diabetes mellitus [facility])     Patient Active Problem List   Diagnosis Date Noted  . Rectal bleeding 10/31/2020  . AKI (acute kidney injury) (HCC) 10/31/2020  . Cardiomyopathy (HCC) 09/23/2020  . Atypical chest pain 09/23/2020  . Precordial pain   . CVA (cerebral vascular accident) (HCC) 09/02/2020  . Altered mental status 07/23/2020  . Seizure (HCC) 07/22/2020  . Left-sided weakness 07/04/2020  . Dysarthria 07/04/2020  . Essential hypertension   . Seizure disorder (HCC) 07/02/2017  . Insulin-requiring or  dependent type II diabetes mellitus (HCC) 07/02/2017  . CKD (chronic kidney disease), stage III (HCC) 07/02/2017  . Normocytic anemia 07/02/2017  . Testicular/scrotal pain 11/09/2013  . Microhematuria 11/09/2013  . Condyloma acuminatum of scrotum 11/09/2013    Past Surgical History:  Procedure Laterality Date  . ABDOMINAL SURGERY    . ANTERIOR CERVICAL DECOMP/DISCECTOMY FUSION  2007   C4  --  C6  . CERVICAL FUSION    . CHOLECYSTECTOMY    . COLONOSCOPY WITH PROPOFOL Left 11/02/2020   Procedure: COLONOSCOPY WITH PROPOFOL;  Surgeon: Willis Modena, MD;  Location: [facility] ENDOSCOPY;  Service: Endoscopy;  Laterality: Left;  . CYSTOSCOPY N/A 11/09/2013   Procedure: CYSTOSCOPY FLEXIBLE;  Surgeon: Bjorn Pippin, MD;  Location: [facility];  Service: Urology;  Laterality: N/A;  . EPIDIDYMECTOMY Left 11/09/2013   Procedure:  LEFT EPIDIDYMECTOMY;  Surgeon: Bjorn Pippin, MD;  Location: [facility];  Service: Urology;  Laterality: Left;  POSSIBLE OUTPATIENT WITH OBSERVATION  . EXCISION LIPOMA LEFT SHOULDER  2004 (APPROX)  . MULTIPLE CYST REMOVED FROM CHEST  AGE 66  . OTHER SURGICAL HISTORY     hemorroid surgery   . TESTICLE REMOVAL Left   . TONSILLECTOMY         Family History  Problem Relation Age of Onset  . Heart failure Mother   . Diabetes Mother   . Hypertension  Mother   . Cirrhosis Father   . Heart failure Father   . Kidney disease Father     Social History   Tobacco Use  . Smoking status: Former Smoker    Packs/day: 0.25    Years: 15.00    Pack years: 3.75    Types: Cigarettes    Quit date: 11/08/1992    Years since quitting: 28.0  . Smokeless tobacco: Never Used  Vaping Use  . Vaping Use: Never used  Substance Use Topics  . Alcohol use: No  . Drug use: No    Home Medications Prior to Admission medications   Medication Sig Start Date End Date Taking? Authorizing Provider  albuterol (PROVENTIL HFA;VENTOLIN HFA) 108 (90 Base) MCG/ACT inhaler  Inhale 1-2 puffs into the lungs every 6 (six) hours as needed for wheezing or shortness of breath. 11/15/18   Rancour, Jeannett Senior, MD  atorvastatin (LIPITOR) 80 MG tablet Take 1 tablet (80 mg total) by mouth at bedtime. Patient taking differently: Take 80 mg by mouth daily.  09/05/20   [name], Earl Lites, MD  budesonide-formoterol (SYMBICORT) 80-4.5 MCG/ACT inhaler Inhale 2 puffs into the lungs daily.    [provider]  clopidogrel (PLAVIX) 75 MG tablet Take 1 tablet (75 mg total) by mouth daily. 07/05/20   Azucena Fallen, MD  FARXIGA 10 MG TABS tablet Take 10 mg by mouth daily. 11/05/20   [provider]  fenofibrate (TRICOR) 145 MG tablet Take 1 tablet (145 mg total) by mouth daily. 11/02/20   Swayze, Ava, DO  fluticasone-salmeterol (ADVAIR HFA) 115-21 MCG/ACT inhaler Inhale 2 puffs into the lungs daily.    [provider]  gabapentin (NEURONTIN) 300 MG capsule Take 2 capsules (600 mg total) by mouth daily. 11/03/20   Swayze, Ava, DO  gabapentin (NEURONTIN) 400 MG capsule Take 3 capsules (1,200 mg total) by mouth at bedtime. 11/02/20   Swayze, Ava, DO  gabapentin (NEURONTIN) 600 MG tablet Take 600-1,800 mg by mouth See admin instructions. Take 1 tablet (600mg ) by mouth daily and take 3 tablets (1,800mg ) by mouth at bedtime. 11/20/20   [provider]  HUMALOG KWIKPEN 100 UNIT/ML KwikPen Inject 0-15 Units into the skin 3 (three) times daily before meals. 11/05/20   [provider]  HUMALOG KWIKPEN 200 UNIT/ML KwikPen Inject 0-10 Units into the skin 3 (three) times daily with meals. Patient taking differently: Inject 0-14 Units into the skin See admin instructions. Inject into the skin up to 4 times a day, per sliding scale: BGL 160 or greater = 10 units; 200 or greater = 14 units 07/05/20   07/07/20, MD  latanoprost (XALATAN) 0.005 % ophthalmic solution Place 1 drop into both eyes at bedtime.  07/04/20   [provider]   levETIRAcetam (KEPPRA) 250 MG tablet Take 250 mg by mouth in the morning and at bedtime.    [provider]  metFORMIN (GLUCOPHAGE) 1000 MG tablet Take 1,000 mg by mouth 2 (two) times daily. 11/04/20   [provider]  metoCLOPramide (REGLAN) 10 MG tablet Take 10 mg by mouth 4 (four) times daily.    [provider]  metoprolol tartrate (LOPRESSOR) 100 MG tablet Take 1 tablet (100 mg total) by mouth 2 (two) times daily. - Metoprolol tartarate 100 mg 2 hours before CT scan Patient not taking: No sig reported 10/21/20 01/19/21  Patwardhan, 01/21/21, MD  nortriptyline (PAMELOR) 50 MG capsule Take 100 mg by mouth at bedtime.     [provider]  oxyCODONE-acetaminophen (PERCOCET) 10-325 MG tablet Take 1.5 tablets by mouth 4 (four) times daily. 10/24/20   [provider]  pantoprazole (PROTONIX) 40 MG tablet Take 40 mg by mouth daily. 07/03/20   [provider]  polyethylene glycol powder (GLYCOLAX/MIRALAX) 17 GM/SCOOP powder Take 17 g by mouth daily as needed for mild constipation. 12/31/15   [provider]  sertraline (ZOLOFT) 50 MG tablet Take 50 mg by mouth daily. 06/13/20   [provider]  SUMAtriptan (IMITREX) 100 MG tablet Take 1 tablet (100 mg total) by mouth daily as needed for migraine or headache. May repeat in 2 hours if headache persists or recurs. 07/25/20   Leatha Gilding, MD  tamsulosin (FLOMAX) 0.4 MG CAPS capsule Take 0.4 mg by mouth in the morning and at bedtime.     [provider]  timolol (TIMOPTIC) 0.5 % ophthalmic solution Place 1 drop into both eyes 2 (two) times daily.  04/06/20   [provider]    Allergies    Finasteride, Levothyroxine, Nsaids, Amoxicillin, Ampicillin, Penicillins, Strawberry extract, Asa [aspirin], Bactrim [sulfamethoxazole-trimethoprim], Dapagliflozin, Depakote [divalproex sodium], Dilantin [phenytoin], Methocarbamol, Other, Risperidone and related, Sitagliptin,  Strawberry (diagnostic), Sulfa antibiotics, Tolmetin, Ultram [tramadol hcl], Buprenorphine, and Oatmeal  Review of Systems   Review of Systems  Unable to perform ROS: Acuity of condition    Physical Exam Updated Vital Signs BP 103/72   Pulse 97   Temp (!) 96.8 F (36 C)   Resp 20   Ht 1.702 m (5\' 7" )   SpO2 99%   BMI 31.25 kg/m   Physical Exam Vitals and nursing note reviewed.  Constitutional:      Comments: Patient became apneic during evaluation  HENT:     Head: Normocephalic.     Right Ear: External ear normal.     Left Ear: External ear normal.     Nose: Nose normal.     Mouth/Throat:     Pharynx: Oropharynx is clear.  Eyes:     Comments: Pupils are pinpoint  Cardiovascular:     Rate and Rhythm: Normal rate and regular rhythm.     Pulses: Normal pulses.     Heart sounds: Normal heart sounds.  Pulmonary:     Comments: Patient has slowed sonorous respirations and intermittent episodes of apnea Abdominal:     General: Bowel sounds are normal. There is no distension.     Palpations: Abdomen is soft.  Genitourinary:    Penis: Normal.   Musculoskeletal:        General: No swelling or deformity.     Cervical back: Normal range of motion.     Right lower leg: No edema.     Left lower leg: No edema.  Skin:    General: Skin is warm and dry.     Capillary Refill: Capillary refill takes less than 2 seconds.     Coloration: Skin is not jaundiced.     Findings: No bruising or lesion.  Neurological:     Comments: Patient is unresponsive.  Intermittently during exam he has had some decerebrate posturing Bilateral patellar reflexes are 2+     ED Results / Procedures / Treatments   Labs (all labs ordered are listed, but only abnormal results are displayed) Labs Reviewed  CULTURE, BLOOD (ROUTINE X 2)  CULTURE, BLOOD (ROUTINE X 2)  COMPREHENSIVE METABOLIC PANEL  CBC WITH DIFFERENTIAL/PLATELET  URINALYSIS, COMPLETE (UACMP) WITH MICROSCOPIC  AMMONIA  LACTIC ACID,  PLASMA  PROTIME-INR  CBG MONITORING,  ED  I-STAT CHEM 8, ED  I-STAT ARTERIAL BLOOD GAS, ED    EKG EKG Interpretation  Date/Time:  Tuesday December 03 2020 13:28:04 EST Ventricular Rate:  83 PR Interval:    QRS Duration: 99 QT Interval:  378 QTC Calculation: 445 R Axis:   -38 Text Interpretation: Sinus rhythm Left axis deviation Borderline T wave abnormalities Confirmed by Margarita Grizzleay, Luiza Carranco [phone]([id number]) on 12/03/2020 2:29:33 PM   Radiology DG Chest Portable 1 View  Result Date: 12/03/2020 CLINICAL DATA:  Intubation. EXAM: PORTABLE CHEST 1 VIEW COMPARISON:  Chest x-Riyanna Crutchley 10/15/2020. FINDINGS: Endotracheal tube tip noted in the orifice of the right mainstem bronchus. Proximal repositioning of approximately 5 cm suggested. Cardiomegaly with pulmonary venous congestion. Low lung volumes with bilateral subsegmental atelectasis. Left base infiltrate cannot be excluded. Bilateral pleural thickening noted. No pleural effusion or pneumothorax. IMPRESSION: 1. Endotracheal tube tip noted in the orifice of the right mainstem bronchus. Proximal repositioning of approximately 5 cm suggested. 2. Cardiomegaly with pulmonary venous congestion. 3. Low lung volumes with bilateral subsegmental atelectasis. Left base infiltrate cannot be excluded. Electronically Signed   By: Maisie Fushomas  Register   On: 12/03/2020 14:05    Procedures Procedure Name: Intubation Date/Time: 12/03/2020 3:12 PM Performed by: Margarita Grizzleay, Deadra Diggins, MD Pre-anesthesia Checklist: Patient identified, Patient being monitored, Emergency Drugs available, Timeout performed and Suction available Oxygen Delivery Method: Non-rebreather mask Preoxygenation: Pre-oxygenation with 100% oxygen Induction Type: Rapid sequence Ventilation: Mask ventilation without difficulty Tube size: 7.5 mm Number of attempts: 1 Placement Confirmation: ETT inserted through vocal cords under direct vision and CO2 detector Secured at: 27 cm Tube secured with: ETT holder Dental  Injury: Teeth and Oropharynx as per pre-operative assessment  Comments: Patient with decreased breath sounds on left and chest x-Maximum Reiland with right mainstem intubation.  ET tube pulled back 5 cm with good bilateral breath sounds     .Critical Care Performed by: Margarita Grizzleay, Andreya Lacks, MD Authorized by: Margarita Grizzleay, Fritzie Prioleau, MD   Critical care provider statement:    Critical care time (minutes):  60   Critical care end time:  12/03/2020 3:29 PM   Critical care was necessary to treat or prevent imminent or life-threatening deterioration of the following conditions:  Respiratory failure   Critical care was time spent personally by me on the following activities:  Discussions with consultants, evaluation of patient's response to treatment, examination of patient, ordering and performing treatments and interventions, ordering and review of laboratory studies, ordering and review of radiographic studies, pulse oximetry, re-evaluation of patient's condition, obtaining history from patient or surrogate and review of old charts   (including critical care time)  Medications Ordered in ED Medications  rocuronium bromide 100 MG/10ML SOSY (  Not Given 12/03/20 1414)  midazolam (VERSED) 2 MG/2ML injection (has no administration in time range)  etomidate (AMIDATE) injection ( Intravenous Canceled Entry 12/03/20 1345)  succinylcholine (ANECTINE) injection ( Intravenous Canceled Entry 12/03/20 1345)  midazolam (VERSED) 5 MG/5ML injection (4 mg Intravenous Given 12/03/20 1400)  sodium chloride 0.9 % bolus 1,000 mL (has no administration in time range)    And  0.9 %  sodium chloride infusion (has no administration in time range)  naloxone (NARCAN) 0.4 MG/ML injection ( Intravenous Given 12/03/20 1332)  naloxone (NARCAN) 0.4 MG/ML injection (  Given 12/03/20 1339)  propofol (DIPRIVAN) 1000 MG/100ML infusion (65 mcg/kg/min  Rate/Dose Change 12/03/20 1400)    ED Course  I have reviewed the triage vital signs and the nursing  notes.  Pertinent labs & imaging results that were  available during my care of the patient were reviewed by me and considered in my medical decision making (see chart for details).  Clinical Course as of 12/03/20 1527  Tue Dec 03, 2020  1526 CBC normal  [DR]    Clinical Course User Index [DR] Margarita Grizzle, MD   MDM Rules/Calculators/A&P                          59 year old man presents today with episode of reported syncope followed by grand mal seizure.  Here in the ED he is unresponsive with pinpoint pupils.  He received Narcan 0.4 mg without any change in mental status.  He became apneic and was subsequently intubated in the ED.  Patient then sedated with propofol and Versed. CT without acute intracranial process Patient has had some low blood pressures but have rebounded without intervention.  Patient required significant dose of propofol and Versed for initial sedation  Acute altered mental status with respiratory failure Post ictal vs od/toxic vs stroke vs infection  Awaiting abg No leukocytosis or other s/s infection noted. Critical care consulted for admission=discussed with Canary Brim Discussed with Dr. Otelia Limes for neurology and will see in consult 4:01 PM sbp 92 Iv fluids ordered uds pending Patient coughing and gagging- fentanyl being added for sedation Discussed with Dr. Fredderick Phenix who has assumed care Final Clinical Impression(s) / ED Diagnoses Final diagnoses:  Acute respiratory failure, unspecified whether with hypoxia or hypercapnia (HCC)  Altered mental status, unspecified altered mental status type  Seizure [facility])    Rx / DC Orders ED Discharge Orders    None       Margarita Grizzle, MD 12/03/20 1603    Margarita Grizzle, MD 12/03/20 [phone]

## 2020-12-03 NOTE — Progress Notes (Signed)
STAT EEG complete - results pending. ? ?

## 2020-12-03 NOTE — H&P (Signed)
NAME:  Nicholas [name], MRN:  [id number], DOB:  Apr 16, 1961, LOS: 0 ADMISSION DATE:  12/03/2020, CONSULTATION DATE:  12/03/20 REFERRING MD:  Rosalia Hammers  CHIEF COMPLAINT:  Seizures   Brief History   Nicholas [name] is a 59 y.o. male who was admitted 12/28 with seizures requiring intubation due to apnea following versed.  History of present illness   Pt is encephelopathic; therefore, this HPI is obtained from chart review. Nicholas [name] is a 59 y.o. male who has a PMH including but not limited to seizures, TIA, bipolar 1, DM, CKD, HTN, Cardiomyopathy (see "past medical history" for rest).  He presented to [facility] after he had a syncopal episode at home.  He had apparently called EMS with complaints of a hernia along with dyspnea.  When EMS arrived, he tried to stand up to get on stretcher and when he did, he had a syncopal episode then remained unresponsive briefly before having a grand mal seizure that lasted approximately 1 minutes.  He received 2.5mg  versed which resolved seizure; however, he remained unresponsive and apneic.   He received narcan 0.4mg  without response.  Due to persistent apnea, he was intubated.  Following intubation, he required propofol and versed due to possible ongoing seizures vs myoclonus vs biting on ETT.  PCCM called for admission and neuro called in consultation given seizures.  Past Medical History  has Testicular/scrotal pain; Microhematuria; Condyloma acuminatum of scrotum; Seizure disorder (HCC); Insulin-requiring or dependent type II diabetes mellitus (HCC); CKD (chronic kidney disease), stage III (HCC); Normocytic anemia; Essential hypertension; Left-sided weakness; Dysarthria; Seizure (HCC); Altered mental status; CVA (cerebral vascular accident) (HCC); Precordial pain; Cardiomyopathy (HCC); Atypical chest pain; Rectal bleeding; AKI (acute kidney injury) (HCC); and Seizures (HCC) on their problem list.  Significant Hospital Events   12/28 > admit  Consults:  Neuro,  PCCM  Procedures:  ETT 12/28 >   Significant Diagnostic Tests:  CXR > atx, possible left basilar infiltrate. CT head 12/28 > neg. EEG 12/28 >   Micro Data:  COVID 12/28 >  Flu 12/28 >  Blood 12/28 >  Sputum 12/28 >   Antimicrobials:  None  Interim history/subjective:    Objective:  Blood pressure 104/80, pulse 84, temperature (!) 96.2 F (35.7 C), resp. rate 20, height 5\' 7"  (1.702 m), weight 90.5 kg, SpO2 100 %.    Vent Mode: PRVC FiO2 (%):  [60 %-100 %] 60 % Set Rate:  [18 bmp] 18 bmp Vt Set:  [530 mL] 530 mL PEEP:  [5 cmH20] 5 cmH20 Plateau Pressure:  [25 cmH20] 25 cmH20   Intake/Output Summary (Last 24 hours) at 12/03/2020 1711 Last data filed at 12/03/2020 1519 Gross per 24 hour  Intake 1000 ml  Output 280 ml  Net 720 ml   Filed Weights   12/03/20 1530  Weight: 90.5 kg    Examination: General: critically ill appearing man intubated and sedated Neuro: RASS -5, withdraws from nailbed pressure in all extremities. + tracheal cough. Pinpoint pupils. HEENT: [place]/AT, eyes anicteric. Oral mucosa moist. Cardiovascular: RRR, no murmurs Lungs: CTAB, breathing synchronously with the vent Abdomen: Soft, NT. Well-healed epigastric linear scar without obvious hernia. No inguinal hernias apparent at this time. Musculoskeletal: normal muscle mass Skin: warm, dry, no rashes  CXR personally reviewed > ETT R mainstem. No opacities. Cardiomegaly present.  Assessment & Plan:   Seizure witnessed by EMS - grand mal seizure on EMS arrival which broke after 2.5mg  versed. Hx migraines, bipolar 1 disorder, TIA. Admission head CT  without acute abnormalities. - Neuro consulted- appreciate their recommendations - EEG w/o apparent seizure activity - UDS pending - Levetiracetam 300mg  load x 1 then start 100mg  BID (on Levetiracetam 250mg  BID as an outpatient). - Continue home Clopidogrel - Hold home Gabapentin, Nortriptyline, Percocet, Sertraline, Sumatriptan. - may need  MRI  Acute hypoxic respiratory failure requiring intubation - 2/2 above. - LTVV 4-8cc/kg IBW with goal Pplat <30 and DP<15 - SBT & SAT when appropriate - VAP prevention protocol. - sedation per PAD protocol  CKD IIIA - strict I/Os - renally dose meds, avoid nephrotoxic meds - con't to monitor  Hx DM - SSI - with unclear baseline insulin regimen, will start lantus 10 units daily - Hold home Farxiga, Humalog, Metformin - checking A1c  Hx HTN, HLD - Continue home Atorvastatin, Clopidogrel. - Hold home Lopressor.  Hx Asthma - stable - Continue home ICS/LABA> convert to nebulized. -PRN albuterol  Lactic acidosis- suspect this is due to seizure. No source of infection noted at this time -follow up lactate to ensure clearance; received >2L IVF in the ED -follow up ED cultures  GoC -No family at bedside. Sister has an incorrect phone number in the chart and was not able to be reached. updated via phone and she will let his older sister know that he has been admitted.  Best Practice:  Diet: NPO. Pain/Anxiety/Delirium protocol (if indicated): Propofol gtt, fentaly & midazolam PRN. VAP protocol (if indicated): In place. DVT prophylaxis: SCD's / Heparin. GI prophylaxis: PPI. Glucose control: basal bolus Mobility: Bedrest. Last date of multidisciplinary goals of care discussion:  Family and staff present: None. Summary of discussion: None. Follow up goals of care discussion due:  Code Status: Full. Family Communication: [place]essa [place]-  friend updated  Disposition: ICU.  Labs   CBC: Recent Labs  Lab 12/03/20 1444 12/03/20 1458 12/03/20 1530 12/03/20 1631  WBC 5.6  --   --   --   NEUTROABS 3.2  --   --   --   HGB 12.1* 12.6* 12.6* 12.2*  HCT 39.2 37.0* 37.0* 36.0*  MCV 88.3  --   --   --   PLT 171  --   --   --    Basic Metabolic Panel: Recent Labs  Lab 12/03/20 1444 12/03/20 1458 12/03/20 1530 12/03/20 1631  NA 140 142 141  141  K 3.7 3.5 4.4 4.6  CL 111 109  --   --   CO2 20*  --   --   --   GLUCOSE 96 89  --   --   BUN 18 20  --   --   CREATININE 1.45* 1.30*  --   --   CALCIUM 8.3*  --   --   --    GFR: Estimated Creatinine Clearance: 65.7 mL/min (A) (by C-G formula based on SCr of 1.3 mg/dL (H)). Recent Labs  Lab 12/03/20 1444  WBC 5.6  LATICACIDVEN 2.4*   Liver Function Tests: Recent Labs  Lab 12/03/20 1444  AST 22  ALT 23  ALKPHOS 48  BILITOT 0.2*  PROT 5.7*  ALBUMIN 3.3*   No results for input(s): LIPASE, AMYLASE in the last 168 hours. Recent Labs  Lab 12/03/20 1444  AMMONIA 41*   ABG    Component Value Date/Time   PHART 7.324 (L) 12/03/2020 1631   PCO2ART 42.5 12/03/2020 1631   PO2ART 292 (H) 12/03/2020 1631   HCO3 22.1 12/03/2020 1631   TCO2 23  12/03/2020 1631   ACIDBASEDEF 4.0 (H) 12/03/2020 1631   O2SAT 100.0 12/03/2020 1631    Coagulation Profile: Recent Labs  Lab 12/03/20 1444  INR 1.4*   Cardiac Enzymes: No results for input(s): CKTOTAL, CKMB, CKMBINDEX, TROPONINI in the last 168 hours. HbA1C: Hgb A1c MFr Bld  Date/Time Value Ref Range Status  09/03/2020 05:03 AM 7.1 (H) 4.8 - 5.6 % Final    Comment:    (NOTE) Pre diabetes:          5.7%-6.4%  Diabetes:              >6.4%  Glycemic control for   <7.0% adults with diabetes   07/04/2020 02:32 PM 6.7 (H) 4.8 - 5.6 % Final    Comment:    (NOTE) Pre diabetes:          5.7%-6.4%  Diabetes:              >6.4%  Glycemic control for   <7.0% adults with diabetes    CBG: No results for input(s): GLUCAP in the last 168 hours.  Review of Systems:   Unable to obtain as pt is encephalopathic.  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   Surgical History    Past Surgical History:  Procedure Laterality Date  . ABDOMINAL SURGERY    . ANTERIOR CERVICAL DECOMP/DISCECTOMY FUSION  2007   C4  --  C6  . CERVICAL FUSION    . CHOLECYSTECTOMY    . COLONOSCOPY WITH PROPOFOL Left 11/02/2020   Procedure: COLONOSCOPY WITH PROPOFOL;  Surgeon: Willis Modenautlaw, William, MD;  [facility];  Service: Endoscopy;  Laterality: Left;  . CYSTOSCOPY N/A 11/09/2013   Procedure: CYSTOSCOPY FLEXIBLE;  Surgeon: Bjorn PippinJohn Wrenn, MD;  [facility];  Service: Urology;  Laterality: N/A;  . EPIDIDYMECTOMY Left 11/09/2013   Procedure:  LEFT EPIDIDYMECTOMY;  Surgeon: Bjorn PippinJohn Wrenn, MD;  [facility];  Service: Urology;  Laterality: Left;  POSSIBLE OUTPATIENT WITH OBSERVATION  . EXCISION LIPOMA LEFT SHOULDER  2004 (APPROX)  . MULTIPLE CYST REMOVED FROM CHEST  AGE 42  . OTHER SURGICAL HISTORY     hemorroid surgery   . TESTICLE REMOVAL Left   . TONSILLECTOMY       Social History   reports that he quit smoking about 28 years ago. His smoking use included cigarettes. He has a 3.75 pack-year smoking history. He has never used smokeless tobacco. He reports that he does not drink alcohol and does not use drugs.   Family history   His family history includes Cirrhosis in his father; Diabetes in his mother; Heart failure in his father and mother; Hypertension in his mother; Kidney disease in his father.   Allergies Allergies  Allergen Reactions  . Finasteride Other (See Comments)    Break out  . Levothyroxine Anaphylaxis and Cough    Chronic cough  . Nsaids Other (See Comments)    D/t gastric ulcer  . Amoxicillin Itching and Other (See Comments)    THRUSH Causes sores in mouth  . Ampicillin Other (See Comments)    THRUSH  . Penicillins Itching and  Other  (See Comments)    THRUSH- Causes sores in mouth  . Strawberry Extract Swelling    LIPS SWELL  . Asa [Aspirin] Other (See Comments)    Bleeding   . Bactrim [Sulfamethoxazole-Trimethoprim] Hives, Itching and Other (See Comments)    GI upset  . Dapagliflozin     Other reaction(s): Other (See Comments) Allergic to steroids and had mouth broke out.   . Depakote [Divalproex Sodium]     Causes double vision and speech problems   . Dilantin [Phenytoin] Other (See Comments)    Severe skin peeling  . Methocarbamol Other (See Comments)    dizziness  . Other     Med for prostate that startes with a M- Broke out the insid eof mouth, split the tongue, and caused throudh  . Risperidone And Related Other (See Comments)    Hallucinations   . Sitagliptin Other (See Comments)    pancreatitis  . Strawberry (Diagnostic) Itching and Swelling  . Sulfa Antibiotics Other (See Comments)    Affects thyroid   . Tolmetin Other (See Comments)    D/t gastric ulcer  . Ultram [Tramadol Hcl] Other (See Comments)    D/t gastric ulcer  . Buprenorphine Itching, Rash and Other (See Comments)    "Patches broke me out in red patches-  caused a fever, also"  . Oatmeal Hives and Other (See Comments)    White spots/sores in mouth     Home meds  Prior to Admission medications   Medication Sig Start Date End Date Taking? Authorizing Provider  albuterol (PROVENTIL HFA;VENTOLIN HFA) 108 (90 Base) MCG/ACT inhaler Inhale 1-2 puffs into the lungs every 6 (six) hours as needed for wheezing or shortness of breath. 11/15/18   Rancour, Jeannett Senior, MD  atorvastatin (LIPITOR) 80 MG tablet Take 1 tablet (80 mg total) by mouth at bedtime. Patient taking differently: Take 80 mg by mouth daily.  09/05/20   Danford, Earl Lites, MD  budesonide-formoterol (SYMBICORT) 80-4.5 MCG/ACT inhaler Inhale 2 puffs into the lungs daily.    [provider]  clopidogrel (PLAVIX) 75 MG tablet Take 1 tablet (75 mg total) by mouth daily.  07/05/20   Azucena Fallen, MD  FARXIGA 10 MG TABS tablet Take 10 mg by mouth daily. 11/05/20   [provider]  fenofibrate (TRICOR) 145 MG tablet Take 1 tablet (145 mg total) by mouth daily. 11/02/20   Swayze, Ava, DO  fluticasone-salmeterol (ADVAIR HFA) 115-21 MCG/ACT inhaler Inhale 2 puffs into the lungs daily.    [provider]  gabapentin (NEURONTIN) 300 MG capsule Take 2 capsules (600 mg total) by mouth daily. 11/03/20   Swayze, Ava, DO  gabapentin (NEURONTIN) 400 MG capsule Take 3 capsules (1,200 mg total) by mouth at bedtime. 11/02/20   Swayze, Ava, DO  gabapentin (NEURONTIN) 600 MG tablet Take 600-1,800 mg by mouth See admin instructions. Take 1 tablet ( ) by mouth daily and take 3 tablets (1,800mg ) by mouth at bedtime. 11/20/20   [provider]  HUMALOG KWIKPEN 100 UNIT/ML KwikPen Inject 0-15 Units into the skin 3 (three) times daily before meals. 11/05/20   [provider]  HUMALOG KWIKPEN 200 UNIT/ML KwikPen Inject 0-10 Units into the skin 3 (three) times daily with meals. Patient taking differently: Inject 0-14 Units into the skin See admin instructions. Inject into the skin up to 4 times a day, per sliding scale: BGL 160 or greater = 10 units; 200 or greater = 14 units 07/05/20   Azucena Fallen, MD  latanoprost (XALATAN) 0.005 % ophthalmic solution Place 1 drop into both eyes at bedtime.  07/04/20   [provider]  levETIRAcetam (KEPPRA) 250 MG tablet Take 250 mg by mouth in the morning and at bedtime.    [provider]  metFORMIN (GLUCOPHAGE) 1000 MG tablet Take 1,000 mg by mouth 2 (two) times daily. 11/04/20   [provider]  metoCLOPramide (REGLAN) 10 MG tablet Take 10 mg by mouth 4 (four) times daily.    [provider]  metoprolol tartrate (LOPRESSOR) 100 MG tablet Take 1 tablet (100 mg total) by mouth 2 (two) times daily. - Metoprolol tartarate 100 mg 2 hours before CT scan Patient not  taking: No sig reported 10/21/20 01/19/21  Patwardhan, Anabel Bene, MD  nortriptyline (PAMELOR) 50 MG capsule Take 100 mg by mouth at bedtime.     [provider]  oxyCODONE-acetaminophen (PERCOCET) 10-325 MG tablet Take 1.5 tablets by mouth 4 (four) times daily. 10/24/20   [provider]  pantoprazole (PROTONIX) 40 MG tablet Take 40 mg by mouth daily. 07/03/20   [provider]  polyethylene glycol powder (GLYCOLAX/MIRALAX) 17 GM/SCOOP powder Take 17 g by mouth daily as needed for mild constipation. 12/31/15   [provider]  sertraline (ZOLOFT) 50 MG tablet Take 50 mg by mouth daily. 06/13/20   [provider]  SUMAtriptan (IMITREX) 100 MG tablet Take 1 tablet (100 mg total) by mouth daily as needed for migraine or headache. May repeat in 2 hours if headache persists or recurs. 07/25/20   Leatha Gilding, MD  tamsulosin (FLOMAX) 0.4 MG CAPS capsule Take 0.4 mg by mouth in the morning and at bedtime.     [provider]  timolol (TIMOPTIC) 0.5 % ophthalmic solution Place 1 drop into both eyes 2 (two) times daily.  04/06/20   [provider]    Rutherford Guys, PA - [facility] For pager details, please see AMION 12/03/2020, 5:11 PM    This patient is critically ill with multiple organ system failure which requires frequent high complexity decision making, assessment, support, evaluation, and titration of therapies. This was completed through the application of advanced monitoring technologies and extensive interpretation of multiple databases. During this encounter critical care time was devoted to patient care services described in this note for 55 minutes.   Steffanie Dunn, DO 12/03/20 5:34 PM [facility]

## 2020-12-03 NOTE — Consult Note (Signed)
NEURO HOSPITALIST CONSULT NOTE   Requestig physician: Dr. Rosalia Hammersay  Reason for Consult: Acute LOC followed by seizure and unresponsiveness  History obtained from:  Chart   HPI:                                                                                                                                         Nicholas [name] is a 59 y.o. male with a history of seizures (on Keppra). Asthma, bipolar disorder, HTN, hyperthyroidism, migraines, prior TIA and DM2  who presents to the ED via EMS following a seizure at home. He initially had called EMS to his house for evaluation of symptoms related to a ventral hernia. When he stood up to get on the stretcher, he had a syncopal episode and was completely unresponsive, followed by a full body seizure lasting 1 minute. Versed 2.5 mg was administered. The patient was lethargic on arrival to the ED and unable to answer questions. He was intubated for airway protection.   Past Medical History:  Diagnosis Date  . Asthma   . Bipolar 1 disorder (HCC)   . Borderline glaucoma   . Chronic pain   . Epididymal pain    LEFT  . 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 Feeling of incomplete bladder emptying   . Frequency of urination   . Gastric ulcer   . GERD (gastroesophageal reflux disease)   . Hypertension   . Hyperthyroidism    NO MEDS   . Migraine   . Seizures (HCC)   . TIA (transient ischemic attack)   . Type 2 diabetes mellitus (HCC)     Past Surgical History:  Procedure Laterality Date  . ABDOMINAL SURGERY    . ANTERIOR CERVICAL DECOMP/DISCECTOMY FUSION  2007   C4  --  C6  . CERVICAL FUSION    . CHOLECYSTECTOMY    . COLONOSCOPY WITH PROPOFOL Left 11/02/2020   Procedure: COLONOSCOPY WITH PROPOFOL;  Surgeon: Willis Modenautlaw, William, MD;  [facility];  Service: Endoscopy;  Laterality: Left;  . CYSTOSCOPY N/A 11/09/2013    Procedure: CYSTOSCOPY FLEXIBLE;  Surgeon: Bjorn PippinJohn Wrenn, MD;  [facility] [place];  Service: Urology;  Laterality: N/A;  . EPIDIDYMECTOMY Left 11/09/2013   Procedure:  LEFT EPIDIDYMECTOMY;  Surgeon: Bjorn PippinJohn Wrenn, MD;  [facility] [place];  Service: Urology;  Laterality: Left;  POSSIBLE OUTPATIENT WITH OBSERVATION  . EXCISION LIPOMA LEFT SHOULDER  2004 (APPROX)  . MULTIPLE CYST REMOVED FROM CHEST  AGE 65  . OTHER SURGICAL HISTORY     hemorroid surgery   . TESTICLE REMOVAL Left   . TONSILLECTOMY      Family  History  Problem Relation Age of Onset  . Heart failure Mother   . Diabetes Mother   . Hypertension Mother   . Cirrhosis Father   . Heart failure Father   . Kidney disease Father               Social History:  reports that he quit smoking about 28 years ago. His smoking use included cigarettes. He has a 3.75 pack-year smoking history. He has never used smokeless tobacco. He reports that he does not drink alcohol and does not use drugs.  Allergies  Allergen Reactions  . Finasteride Other (See Comments)    Break out  . Levothyroxine Anaphylaxis and Cough    Chronic cough  . Nsaids Other (See Comments)    D/t gastric ulcer  . Amoxicillin Itching and Other (See Comments)    THRUSH Causes sores in mouth  . Ampicillin Other (See Comments)    THRUSH  . Penicillins Itching and Other (See Comments)    THRUSH- Causes sores in mouth  . Strawberry Extract Swelling    LIPS SWELL  . Asa [Aspirin] Other (See Comments)    Bleeding   . Bactrim [Sulfamethoxazole-Trimethoprim] Hives, Itching and Other (See Comments)    GI upset  . Dapagliflozin     Other reaction(s): Other (See Comments) Allergic to steroids and had mouth broke out.   . Depakote [Divalproex Sodium]     Causes double vision and speech problems   . Dilantin [Phenytoin] Other (See Comments)    Severe skin peeling  . Methocarbamol Other (See Comments)    dizziness  . Other     Med for  prostate that startes with a M- Broke out the insid eof mouth, split the tongue, and caused throudh  . Risperidone And Related Other (See Comments)    Hallucinations   . Sitagliptin Other (See Comments)    pancreatitis  . Strawberry (Diagnostic) Itching and Swelling  . Sulfa Antibiotics Other (See Comments)    Affects thyroid   . Tolmetin Other (See Comments)    D/t gastric ulcer  . Ultram [Tramadol Hcl] Other (See Comments)    D/t gastric ulcer  . Buprenorphine Itching, Rash and Other (See Comments)    "Patches broke me out in red patches-  caused a fever, also"  . Oatmeal Hives and Other (See Comments)    White spots/sores in mouth    MEDICATIONS:                                                                                                                     No current facility-administered medications on file prior to encounter.   Current Outpatient Medications on File Prior to Encounter  Medication Sig Dispense Refill  . albuterol (PROVENTIL HFA;VENTOLIN HFA) 108 (90 Base) MCG/ACT inhaler Inhale 1-2 puffs into the lungs every 6 (six) hours as needed for wheezing or shortness of breath. 1 Inhaler 0  . atorvastatin (LIPITOR) 80 MG tablet Take 1 tablet (80 mg total) by  mouth at bedtime. (Patient taking differently: Take 80 mg by mouth daily. ) 30 tablet 3  . budesonide-formoterol (SYMBICORT) 80-4.5 MCG/ACT inhaler Inhale 2 puffs into the lungs daily.    . clopidogrel (PLAVIX) 75 MG tablet Take 1 tablet (75 mg total) by mouth daily. 30 tablet 01  . FARXIGA 10 MG TABS tablet Take 10 mg by mouth daily.    . fenofibrate (TRICOR) 145 MG tablet Take 1 tablet (145 mg total) by mouth daily. 30 tablet 0  . fluticasone-salmeterol (ADVAIR HFA) 115-21 MCG/ACT inhaler Inhale 2 puffs into the lungs daily.    [name] gabapentin (NEURONTIN) 300 MG capsule Take 2 capsules (600 mg total) by mouth daily. 30 capsule 0  . gabapentin (NEURONTIN) 400 MG capsule Take 3 capsules (1,200 mg total) by mouth at  bedtime. 90 capsule 0  . gabapentin (NEURONTIN) 600 MG tablet Take 600-1,800 mg by mouth See admin instructions. Take 1 tablet ( ) by mouth daily and take 3 tablets (1,800mg ) by mouth at bedtime.    [name] HUMALOG KWIKPEN 100 UNIT/ML KwikPen Inject 0-15 Units into the skin 3 (three) times daily before meals.    [name] HUMALOG KWIKPEN 200 UNIT/ML KwikPen Inject 0-10 Units into the skin 3 (three) times daily with meals. (Patient taking differently: Inject 0-14 Units into the skin See admin instructions. Inject into the skin up to 4 times a day, per sliding scale: BGL 160 or greater = 10 units; 200 or greater = 14 units) 1 pen 01  . latanoprost (XALATAN) 0.005 % ophthalmic solution Place 1 drop into both eyes at bedtime.     . levETIRAcetam (KEPPRA) 250 MG tablet Take 250 mg by mouth in the morning and at bedtime.    . metFORMIN (GLUCOPHAGE) 1000 MG tablet Take 1,000 mg by mouth 2 (two) times daily.    . metoCLOPramide (REGLAN) 10 MG tablet Take 10 mg by mouth 4 (four) times daily.    . metoprolol tartrate (LOPRESSOR) 100 MG tablet Take 1 tablet (100 mg total) by mouth 2 (two) times daily. - Metoprolol tartarate 100 mg 2 hours before CT scan (Patient not taking: No sig reported) 2 tablet 0  . nortriptyline (PAMELOR) 50 MG capsule Take 100 mg by mouth at bedtime.     [name] oxyCODONE-acetaminophen (PERCOCET) 10-325 MG tablet Take 1.5 tablets by mouth 4 (four) times daily.    . pantoprazole (PROTONIX) 40 MG tablet Take 40 mg by mouth daily.    . polyethylene glycol powder (GLYCOLAX/MIRALAX) 17 GM/SCOOP powder Take 17 g by mouth daily as needed for mild constipation.    . sertraline (ZOLOFT) 50 MG tablet Take 50 mg by mouth daily.    . SUMAtriptan (IMITREX) 100 MG tablet Take 1 tablet (100 mg total) by mouth daily as needed for migraine or headache. May repeat in 2 hours if headache persists or recurs. 20 tablet 0  . tamsulosin (FLOMAX) 0.4 MG CAPS capsule Take 0.4 mg by mouth in the morning and at bedtime.     .  timolol (TIMOPTIC) 0.5 % ophthalmic solution Place 1 drop into both eyes 2 (two) times daily.        Scheduled: . midazolam      . rocuronium bromide       Continuous: . sodium chloride    . propofol (DIPRIVAN) infusion    . sodium chloride      ROS:  Unable to obtain due to sedation.   Blood pressure (!) 82/59, pulse 81, temperature (!) 96.7 F (35.9 C), resp. rate 20, height 5\' 7"  (1.702 m), SpO2 100 %.   General Examination:                                                                                                       Physical Exam  HEENT-  Normocephalic, atraumatic. Normal external eye and conjunctiva.  ETT in place, secured. OGT in place with evidence of gastric contents present in the tube.  Cardiovascular- on cardiac monitor with regular rhythm, extremities warm without edema.  Lungs- On ETT, saturations within normal limits Abdomen- soft, non-distended Extremities- Warm, dry and intact Musculoskeletal- no joint deformity or swelling noted Skin- warm and dry  Neurological Examination Mental Status: Intubated and sedated with propofol. Propofol paused during examination. Does not open eyes during initial examination. Does not follow commands.  Cranial Nerves: II: Unable to assess visual fields. Eyes without deviation. Corneal reflexes intact.  III,IV, VI: Pupils equal, round, reactive to light 58mm brisk bilaterally V,VII: face appears symmetric, does not grimace or smile. Sensation unable to be assessed due to patient intubated and sedated.  VIII: hearing unable to be assessed IX,X: +cough with suctioning and + gag noted; patient on ventilator  XI, XII: Unable to assess Motor: Extremities do not move spontaneously, no anti-gravity movement noted in extremities. Lower extremities withdrawal to painful stimuli bilaterally. Upper  extremities withdrawal to painful stimuli bilaterally; some purposeful movement noted with patient attempt to localize to pain but is unable to cross midline bilaterally. Bulk normal throughout Sensory: withdrawals to noxious stimuli bilaterally, unable to assess sensation to light touch, temperature, vibration due to patient intubated and sedated Deep Tendon Reflexes: 2+ throughout; biceps and patellae. Plantars: Right: downgoing Left: downgoing Cerebellar/Gait: Unable to assess   Lab Results: Basic Metabolic Panel: Recent Labs  Lab 12/03/20 1444 12/03/20 1458 12/03/20 1530  NA 140 142 141  K 3.7 3.5 4.4  CL 111 109  --   CO2 20*  --   --   GLUCOSE 96 89  --   BUN 18 20  --   CREATININE 1.45* 1.30*  --   CALCIUM 8.3*  --   --    CBC: Recent Labs  Lab 12/03/20 1444 12/03/20 1458 12/03/20 1530  WBC 5.6  --   --   NEUTROABS 3.2  --   --   HGB 12.1* 12.6* 12.6*  HCT 39.2 37.0* 37.0*  MCV 88.3  --   --   PLT 171  --   --    Cardiac Enzymes: No results for input(s): CKTOTAL, CKMB, CKMBINDEX, TROPONINI in the last 168 hours.  Lipid Panel: No results for input(s): CHOL, TRIG, HDL, CHOLHDL, VLDL, LDLCALC in the last 168 hours.  Imaging: CT Head Wo Contrast  Result Date: 12/03/2020 CLINICAL DATA:  Syncope, seizure activity, left far 12/05/2020 EXAM: CT HEAD WITHOUT CONTRAST TECHNIQUE: Contiguous axial images were obtained from the base of the skull through the vertex without intravenous contrast. COMPARISON:  10/15/2020 FINDINGS:  Brain: No acute infarct or hemorrhage. Lateral ventricles and midline structures are unremarkable. No acute extra-axial fluid collections. No mass effect. Vascular: No hyperdense vessel or unexpected calcification. Skull: Normal. Negative for fracture or focal lesion. Sinuses/Orbits: No acute finding. Other: None. IMPRESSION: 1. Stable head CT, no acute process. Electronically Signed   By: Sharlet Salina M.D.   On: 12/03/2020 15:20   DG Chest Portable 1  View  Result Date: 12/03/2020 CLINICAL DATA:  Intubation. EXAM: PORTABLE CHEST 1 VIEW COMPARISON:  Chest x-ray 10/15/2020. FINDINGS: Endotracheal tube tip noted in the orifice of the right mainstem bronchus. Proximal repositioning of approximately 5 cm suggested. Cardiomegaly with pulmonary venous congestion. Low lung volumes with bilateral subsegmental atelectasis. Left base infiltrate cannot be excluded. Bilateral pleural thickening noted. No pleural effusion or pneumothorax. IMPRESSION: 1. Endotracheal tube tip noted in the orifice of the right mainstem bronchus. Proximal repositioning of approximately 5 cm suggested. 2. Cardiomegaly with pulmonary venous congestion. 3. Low lung volumes with bilateral subsegmental atelectasis. Left base infiltrate cannot be excluded. Electronically Signed   By: Maisie Fus  Register   On: 12/03/2020 14:05    Assessment: 59 year old male with a history of seizures, presenting to the ED via EMS after acute LOC followed by seizure activity and then unresponsiveness despite relatively low dose of Versed administered for the seizure. EMS was initially called to the patient's house for "ventral hernia" symptoms. The patient is now intubated and sedated.  1. Exam reveals no clinical seizure activity in the context of sedation versus postictal state.  2. CT head: Stable head CT, no acute process. 3. Has allergies to Depakote and Dilantin 4. On Keppra 250 mg BID at home, which is a low-dose regimen 5. EEG: This study is suggestive of severe diffuse encephalopathy, nonspecific etiology but likely related to sedation. No seizures or epileptiform discharges were seen throughout the recording.  Recommendations: 1. Load Keppra 3000 mg IV x 1 now, then start 1000 mg IV BID (ordered) 2. Inpatient seizure precautions 3. Outpatient seizure precautions: Per [facility] statutes, patients with seizures are not allowed to drive until  they have been seizure-free for six months. Use  caution when using heavy equipment or power tools. Avoid working on ladders or at heights. Take showers instead of baths. Ensure the water temperature is not too high on the home water heater. Do not go swimming alone. When caring for infants or small children, sit down when holding, feeding, or changing them to minimize risk of injury to the child in the event you have a seizure. Also, Maintain good sleep hygiene. Avoid alcohol. 4. Neurology will continue to follow with you.    Electronically signed: Dr. Caryl Pina 12/03/2020, 3:42 PM

## 2020-12-03 NOTE — Procedures (Signed)
Patient Name: Nicholas [name]  MRN: [id number]  Epilepsy Attending: Charlsie Quest  Referring Physician/Provider: Dr Caryl Pina Date: 12/03/2020 Duration: 23.03 mins  Patient history: 59yo M with seizure like activity. EEG to evaluate for seizure.  Level of alertness:  comatose  AEDs during EEG study: LEV, Propofol  Technical aspects: This EEG study was done with scalp electrodes positioned according to the 10-20 International system of electrode placement. Electrical activity was acquired at a sampling rate of 500Hz  and reviewed with a high frequency filter of 70Hz  and a low frequency filter of 1Hz . EEG data were recorded continuously and digitally stored.   Description: EEG showed continuous generalized 2-3 Hz delta slowing with overriding 15-18hz  generalized beta activity.  Hyperventilation and photic stimulation were not performed.     ABNORMALITY -Excessive beta, generalized -Continuous slow, generalized  IMPRESSION: This study is suggestive of severe diffuse encephalopathy, nonspecific etiology but likely related to sedation. No seizures or epileptiform discharges were seen throughout the recording.  Wilkins Elpers 

## 2020-12-03 NOTE — Progress Notes (Signed)
Ventilator pt transported from ED32 to CT1 and back without any complications.

## 2020-12-03 NOTE — Progress Notes (Signed)
Ventilator pt transported from ED32 to 4N28 without any complications.

## 2020-12-04 ENCOUNTER — Encounter (HOSPITAL_COMMUNITY): Payer: Self-pay | Admitting: Pulmonary Disease

## 2020-12-04 ENCOUNTER — Inpatient Hospital Stay (HOSPITAL_COMMUNITY): Payer: Medicare Other

## 2020-12-04 DIAGNOSIS — R4182 Altered mental status, unspecified: Secondary | ICD-10-CM | POA: Diagnosis not present

## 2020-12-04 DIAGNOSIS — R569 Unspecified convulsions: Secondary | ICD-10-CM | POA: Diagnosis not present

## 2020-12-04 DIAGNOSIS — N1831 Chronic kidney disease, stage 3a: Secondary | ICD-10-CM | POA: Diagnosis not present

## 2020-12-04 DIAGNOSIS — J9601 Acute respiratory failure with hypoxia: Secondary | ICD-10-CM | POA: Diagnosis not present

## 2020-12-04 DIAGNOSIS — Z9911 Dependence on respirator [ventilator] status: Secondary | ICD-10-CM | POA: Diagnosis not present

## 2020-12-04 LAB — GLUCOSE, CAPILLARY
Glucose-Capillary: 102 mg/dL — ABNORMAL HIGH (ref 70–99)
Glucose-Capillary: 105 mg/dL — ABNORMAL HIGH (ref 70–99)
Glucose-Capillary: 125 mg/dL — ABNORMAL HIGH (ref 70–99)
Glucose-Capillary: 194 mg/dL — ABNORMAL HIGH (ref 70–99)
Glucose-Capillary: 228 mg/dL — ABNORMAL HIGH (ref 70–99)
Glucose-Capillary: 273 mg/dL — ABNORMAL HIGH (ref 70–99)
Glucose-Capillary: 92 mg/dL (ref 70–99)

## 2020-12-04 LAB — BASIC METABOLIC PANEL
Anion gap: 9 (ref 5–15)
BUN: 19 mg/dL (ref 6–20)
CO2: 22 mmol/L (ref 22–32)
Calcium: 9.3 mg/dL (ref 8.9–10.3)
Chloride: 109 mmol/L (ref 98–111)
Creatinine, Ser: 1.43 mg/dL — ABNORMAL HIGH (ref 0.61–1.24)
GFR, Estimated: 56 mL/min — ABNORMAL LOW (ref >60)
Glucose, Bld: 106 mg/dL — ABNORMAL HIGH (ref 70–99)
Potassium: 3.9 mmol/L (ref 3.5–5.1)
Sodium: 140 mmol/L (ref 135–145)

## 2020-12-04 LAB — MAGNESIUM: Magnesium: 1.8 mg/dL (ref 1.7–2.4)

## 2020-12-04 LAB — POCT I-STAT 7, (LYTES, BLD GAS, ICA,H+H)
Acid-base deficit: 5 mmol/L — ABNORMAL HIGH (ref 0.0–2.0)
Bicarbonate: 21.4 mmol/L (ref 20.0–28.0)
Calcium, Ion: 1.31 mmol/L (ref 1.15–1.40)
HCT: 38 % — ABNORMAL LOW (ref 39.0–52.0)
Hemoglobin: 12.9 g/dL — ABNORMAL LOW (ref 13.0–17.0)
O2 Saturation: 94 %
Patient temperature: 98.2
Potassium: 3.8 mmol/L (ref 3.5–5.1)
Sodium: 142 mmol/L (ref 135–145)
TCO2: 23 mmol/L (ref 22–32)
pCO2 arterial: 41 mmHg (ref 32.0–48.0)
pH, Arterial: 7.324 — ABNORMAL LOW (ref 7.350–7.450)
pO2, Arterial: 74 mmHg — ABNORMAL LOW (ref 83.0–108.0)

## 2020-12-04 LAB — CBC
HCT: 41 % (ref 39.0–52.0)
Hemoglobin: 12.9 g/dL — ABNORMAL LOW (ref 13.0–17.0)
MCH: 27 pg (ref 26.0–34.0)
MCHC: 31.5 g/dL (ref 30.0–36.0)
MCV: 85.8 fL (ref 80.0–100.0)
Platelets: 187 10*3/uL (ref 150–400)
RBC: 4.78 MIL/uL (ref 4.22–5.81)
RDW: 14.6 % (ref 11.5–15.5)
WBC: 8.8 10*3/uL (ref 4.0–10.5)
nRBC: 0 % (ref 0.0–0.2)

## 2020-12-04 LAB — MRSA PCR SCREENING: MRSA by PCR: NEGATIVE

## 2020-12-04 LAB — PHOSPHORUS: Phosphorus: 3.2 mg/dL (ref 2.5–4.6)

## 2020-12-04 MED ORDER — GABAPENTIN 800 MG PO TABS
800.0000 mg | ORAL_TABLET | Freq: Every day | ORAL | Status: DC
  Filled 2020-12-04: qty 1

## 2020-12-04 MED ORDER — SODIUM CHLORIDE 0.9 % IV SOLN
8.0000 mg | Freq: Four times a day (QID) | INTRAVENOUS | Status: DC | PRN
  Filled 2020-12-04: qty 4

## 2020-12-04 MED ORDER — SERTRALINE HCL 50 MG PO TABS
50.0000 mg | ORAL_TABLET | Freq: Every day | ORAL | Status: DC
Start: 2020-12-04 — End: 2020-12-06
  Administered 2020-12-04 – 2020-12-06 (×3): 50 mg via ORAL
  Filled 2020-12-04 (×3): qty 1

## 2020-12-04 MED ORDER — CLOPIDOGREL BISULFATE 75 MG PO TABS
75.0000 mg | ORAL_TABLET | Freq: Every day | ORAL | Status: DC
  Administered 2020-12-05 – 2020-12-06 (×2): 75 mg via ORAL
  Filled 2020-12-04 (×2): qty 1

## 2020-12-04 MED ORDER — OXYCODONE-ACETAMINOPHEN 7.5-325 MG PO TABS
1.0000 | ORAL_TABLET | Freq: Four times a day (QID) | ORAL | Status: DC | PRN
Start: 2020-12-04 — End: 2020-12-06
  Administered 2020-12-04 – 2020-12-06 (×6): 1 via ORAL
  Filled 2020-12-04 (×6): qty 1

## 2020-12-04 MED ORDER — FLUTICASONE FUROATE-VILANTEROL 100-25 MCG/INH IN AEPB
1.0000 | INHALATION_SPRAY | Freq: Every day | RESPIRATORY_TRACT | Status: DC
  Filled 2020-12-04 (×2): qty 28

## 2020-12-04 MED ORDER — METHYLPREDNISOLONE SODIUM SUCC 40 MG IJ SOLR
40.0000 mg | Freq: Once | INTRAMUSCULAR | Status: AC
  Administered 2020-12-04: 40 mg via INTRAVENOUS
  Filled 2020-12-04: qty 1

## 2020-12-04 MED ORDER — ACETAMINOPHEN 500 MG PO TABS
1000.0000 mg | ORAL_TABLET | Freq: Once | ORAL | Status: AC
  Administered 2020-12-04: 1000 mg via ORAL
  Filled 2020-12-04: qty 2

## 2020-12-04 MED ORDER — GABAPENTIN 400 MG PO CAPS
800.0000 mg | ORAL_CAPSULE | Freq: Every day | ORAL | Status: DC
  Administered 2020-12-04 – 2020-12-05 (×2): 800 mg via ORAL
  Filled 2020-12-04 (×2): qty 2

## 2020-12-04 MED ORDER — GABAPENTIN 400 MG PO CAPS: 800.0000 mg | ORAL_CAPSULE | Freq: Every day | ORAL | Status: DC

## 2020-12-04 MED ORDER — RACEPINEPHRINE HCL 2.25 % IN NEBU
0.5000 mL | INHALATION_SOLUTION | Freq: Once | RESPIRATORY_TRACT | Status: AC
  Administered 2020-12-04: 0.5 mL via RESPIRATORY_TRACT
  Filled 2020-12-04: qty 0.5

## 2020-12-04 MED ORDER — SUMATRIPTAN SUCCINATE 100 MG PO TABS
100.0000 mg | ORAL_TABLET | Freq: Once | ORAL | Status: AC
  Administered 2020-12-04: 100 mg via ORAL
  Filled 2020-12-04: qty 1

## 2020-12-04 MED ORDER — ATORVASTATIN CALCIUM 80 MG PO TABS
80.0000 mg | ORAL_TABLET | Freq: Every day | ORAL | Status: DC
  Administered 2020-12-05 – 2020-12-06 (×2): 80 mg via ORAL
  Filled 2020-12-04 (×2): qty 1

## 2020-12-04 MED ORDER — GABAPENTIN 800 MG PO TABS: 800.0000 mg | ORAL_TABLET | Freq: Every day | ORAL | Status: DC

## 2020-12-04 MED ORDER — GABAPENTIN 600 MG PO TABS
600.0000 mg | ORAL_TABLET | Freq: Every day | ORAL | Status: DC
Start: 2020-12-05 — End: 2020-12-06
  Administered 2020-12-05 – 2020-12-06 (×2): 600 mg via ORAL
  Filled 2020-12-04 (×2): qty 1

## 2020-12-04 NOTE — Progress Notes (Addendum)
Subjective: No further seizure like episodes. Reports headache/.    ROS: negative except above  Examination  Vital signs in last 24 hours: Temp:  [96 F (35.6 C)-99.32 F (37.4 C)] 97.9 F (36.6 C) (12/29 1209) Pulse Rate:  [68-108] 83 (12/29 1200) Resp:  [13-27] 15 (12/29 1200) BP: (82-159)/(56-92) 122/65 (12/29 1200) SpO2:  [96 %-100 %] 98 % (12/29 1200) FiO2 (%):  [40 %-60 %] 40 % (12/29 0825) Weight:  [90.5 kg] 90.5 kg (12/28 1530)  General: lying in bed, NAD CVS: pulse-normal rate and rhythm RS: breathing comfortably Extremities: normal   Neuro: MS: AOx3, CN 2-12 grossly intact, spontaneously moving all extremities  Basic Metabolic Panel: Recent Labs  Lab 12/03/20 1444 12/03/20 1458 12/03/20 1530 12/03/20 1631 12/04/20 0152 12/04/20 0305  NA 140 142 141 141 142 140  K 3.7 3.5 4.4 4.6 3.8 3.9  CL 111 109  --   --   --  109  CO2 20*  --   --   --   --  22  GLUCOSE 96 89  --   --   --  106*  BUN 18 20  --   --   --  19  CREATININE 1.45* 1.30*  --   --   --  1.43*  CALCIUM 8.3*  --   --   --   --  9.3  MG  --   --   --   --   --  1.8  PHOS  --   --   --   --   --  3.2    CBC: Recent Labs  Lab 12/03/20 1444 12/03/20 1458 12/03/20 1530 12/03/20 1631 12/04/20 0152 12/04/20 0305  WBC 5.6  --   --   --   --  8.8  NEUTROABS 3.2  --   --   --   --   --   HGB 12.1* 12.6* 12.6* 12.2* 12.9* 12.9*  HCT 39.2 37.0* 37.0* 36.0* 38.0* 41.0  MCV 88.3  --   --   --   --  85.8  PLT 171  --   --   --   --  187     Coagulation Studies: Recent Labs    12/03/20 1444  LABPROT 16.5*  INR 1.4*    Imaging CTH wo contrast: No acute events  ASSESSMENT AND PLAN: 59 year old male with a history of seizures, presenting to the ED via EMS after acute LOC followed by seizure activity and then unresponsiveness despite relatively low dose of Versed administered for the seizure. EMS was initially called to the patient's house for "ventral hernia" symptoms.   Psychogenic  nonepileptic spells/pseudoseizures Headache -EEG on admission did not show any evidence of epileptogenicity  Recommendations -Patient has known history of psychogenic nonepileptic spells/pseudoseizures.  I suspect it is what patient had yesterday - Continue Keppra at home dose, patient states he is n 250mg  BID now - Continue rest of the home meds - Discussed importance of follow up with neurologist for headache and psychiatrist for PNES -Continue seizure precautions including do not drive until cleared by primary neurologist -Patient follows up with neurologist at [facility].  Recommend continue to follow-up wit his neurologist  -Management of rest of comorbidities per primary team  Thank you for allowing [facility] to participate in the care of this patient.  Neurology will sign off.  Please call us for any further questions.  I have spent a total of 28 minutes with  the patient reviewing hospital notes,  test results, labs and examining the patient as well as establishing an assessment and plan that was discussed personally with the patient.  > 50% of time was spent in direct patient care.       Lindie Spruce Epilepsy [facility] For questions after 5pm please refer to AMION to reach the Neurologist on call

## 2020-12-04 NOTE — Plan of Care (Signed)
Complains of migraine- worse frontal pain on the right compared to his usual. No nausea, scotomas, or paresthesias. +Light sensitivity. Can still stick out tongue, move all extremities, normal speech. Still hoarse, but improving since this morning.  Sumatriptan 100mg  (PTA dose) x 1, tylenol 1,000mg  x 1 zofran PRN if he develops nauesa  , DO 12/04/20 12:53 PM [facility]

## 2020-12-04 NOTE — Progress Notes (Signed)
eLink Physician-Brief Progress Note Patient Name: Nicholas [name] DOB: 05/03/1961 MRN: [id number]   Date of Service  12/04/2020  HPI/Events of Note  Patient requests home Zoloft and Neurontin.   eICU Interventions  Plan: 1. Zoloft 50 mg PO now and Q day. 2. Neurontin 600 mg PO Q 10 AM and 800 mg PO Q 10 PM.     Intervention Category Major Interventions: Other:  Lenell Antu 12/04/2020, 9:05 PM

## 2020-12-04 NOTE — Plan of Care (Signed)

## 2020-12-04 NOTE — Procedures (Signed)
Extubation Procedure Note  Patient Details:   Name: Nicholas [name] DOB: 13-Dec-1960 MRN: [id number]   Airway Documentation:    Vent end date: 12/04/20 Vent end time: 1004   Evaluation  O2 sats: stable throughout Complications: No apparent complications Patient did tolerate procedure well. Bilateral Breath Sounds: Diminished   Patient extubated per MD order & placed on 4L [place]. Patient alert/oriented with good strong cough. Able to speak, but voice sounds weak.  Jacqulynn Cadet 12/04/2020, 10:10 AM

## 2020-12-04 NOTE — Progress Notes (Signed)
RN notified that patient attempted to grab at ETT when respiratory therapist was in room. Patient already sedated. This RN called E-link and requested bilateral wrist restraints.

## 2020-12-04 NOTE — Progress Notes (Signed)
eLink Physician-Brief Progress Note Patient Name: Nicholas [name] DOB: [birth year] MRN: [id number]   Date of Service  12/04/2020  HPI/Events of Note  Agitation - Patient almost self extubated. Nursing request for bilateral soft wrist restraints.   eICU Interventions  Will order bilateral soft wrist restraints X 9 hours.      Intervention Category Major Interventions: Delirium, psychosis, severe agitation - evaluation and management  [name] 12/04/2020, 1:43 AM

## 2020-12-04 NOTE — Evaluation (Signed)
Clinical/Bedside Swallow Evaluation Patient Details  Name: Nicholas [name] MRN: [id number] Date of Birth: May 01, 1961  Today's Date: 12/04/2020 Time: SLP Start Time (ACUTE ONLY): 1406 SLP Stop Time (ACUTE ONLY): 1420 SLP Time Calculation (min) (ACUTE ONLY): 14 min  Past Medical History:  Past Medical History:  Diagnosis Date  . Asthma   . Bipolar 1 disorder (HCC)   . Borderline glaucoma   . Chronic pain   . Epididymal pain    LEFT  . 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 Feeling of incomplete bladder emptying   . Frequency of urination   . Gastric ulcer   . GERD (gastroesophageal reflux disease)   . Hypertension   . Hyperthyroidism    NO MEDS   . Migraine   . Seizures (HCC)   . TIA (transient ischemic attack)   . Type 2 diabetes mellitus (HCC)    Past Surgical History:  Past Surgical History:  Procedure Laterality Date  . ABDOMINAL SURGERY    . ANTERIOR CERVICAL DECOMP/DISCECTOMY FUSION  2007   C4  --  C6  . CERVICAL FUSION    . CHOLECYSTECTOMY    . COLONOSCOPY WITH PROPOFOL Left 11/02/2020   Procedure: COLONOSCOPY WITH PROPOFOL;  Surgeon: Willis Modena, MD;  Location: [facility] ENDOSCOPY;  Service: Endoscopy;  Laterality: Left;  . CYSTOSCOPY N/A 11/09/2013   Procedure: CYSTOSCOPY FLEXIBLE;  Surgeon: Bjorn Pippin, MD;  Location: [facility];  Service: Urology;  Laterality: N/A;  . EPIDIDYMECTOMY Left 11/09/2013   Procedure:  LEFT EPIDIDYMECTOMY;  Surgeon: Bjorn Pippin, MD;  Location: [facility];  Service: Urology;  Laterality: Left;  POSSIBLE OUTPATIENT WITH OBSERVATION  . EXCISION LIPOMA LEFT SHOULDER  2004 (APPROX)  . MULTIPLE CYST REMOVED FROM CHEST  AGE 74  . OTHER SURGICAL HISTORY     hemorroid surgery   . TESTICLE REMOVAL Left   . TONSILLECTOMY     HPI:  59 y.o. male who was admitted 12/28 with seizures requiring intubation due to apnea following  versed.  Extubated next day.  PMH includes but not limited to seizures, TIA, bipolar 1, DM, CKD, HTN, Cardiomyopathy.   Assessment / Plan / Recommendation Clinical Impression  Pt presents with normal oropharyngeal swallow with adequate mastication, brisk swallow response, no s/s of aspiration. Voice is hoarse s/p brief intubation, but he appears to be protecting airway well when eating a range of food and liquid consistencies. recommend resuming a regular diet, thin liquids. No SLP f/u is needed - our service will sign off. SLP Visit Diagnosis: Dysphagia, unspecified (R13.10)    Aspiration Risk  No limitations    Diet Recommendation   regular solids, thin liquids  Medication Administration: Whole meds with liquid    Other  Recommendations Oral Care Recommendations: Oral care BID   Follow up Recommendations None        Swallow Study   General Date of Onset: 12/03/20 HPI: 59 y.o. male who was admitted 12/28 with seizures requiring intubation due to apnea following versed.  Extubated next day.  PMH includes but not limited to seizures, TIA, bipolar 1, DM, CKD, HTN, Cardiomyopathy. Type of Study: Bedside Swallow Evaluation Diet Prior to this Study: NPO Temperature Spikes Noted: No Respiratory Status: Room air History of Recent Intubation: Yes Length of Intubations (days): 1 days Date extubated: 12/04/20 Behavior/Cognition: Alert;Cooperative;Pleasant mood Oral Cavity Assessment: Within Functional  Limits Oral Care Completed by SLP: No Oral Cavity - Dentition: Adequate natural dentition Vision: Functional for self-feeding Self-Feeding Abilities: Able to feed self Patient Positioning: Upright in bed Baseline Vocal Quality: Hoarse Volitional Cough: Strong Volitional Swallow: Able to elicit    Oral/Motor/Sensory Function Overall Oral Motor/Sensory Function: Within functional limits   Ice Chips Ice chips: Within functional limits   Thin Liquid Thin Liquid: Within functional limits     Nectar Thick Nectar Thick Liquid: Not tested   Honey Thick Honey Thick Liquid: Not tested   Puree Puree: Within functional limits   Solid     Solid: Within functional limits      Blenda Mounts Nicholas 12/04/2020,2:24 PM  Nicholas Folks L. Samson Frederic, MA CCC/SLP Acute Rehabilitation Services Office number [phone] Pager [phone]

## 2020-12-04 NOTE — Progress Notes (Signed)
NAME:  Nicholas [name], MRN:  [id number], DOB:  01-03-1961, LOS: 1 ADMISSION DATE:  12/03/2020, CONSULTATION DATE:  12/03/20 REFERRING MD:  Rosalia Hammers  CHIEF COMPLAINT:  Seizures   Brief History   Nicholas [name] is a 59 y.o. male who was admitted 12/28 with seizures requiring intubation due to apnea following versed.  History of present illness   Pt is encephelopathic; therefore, this HPI is obtained from chart review. Nicholas [name] is a 59 y.o. male who has a PMH including but not limited to seizures, TIA, bipolar 1, DM, CKD, HTN, Cardiomyopathy (see "past medical history" for rest).  He presented to [facility] after he had a syncopal episode at home.  He had apparently called EMS with complaints of a hernia along with dyspnea.  When EMS arrived, he tried to stand up to get on stretcher and when he did, he had a syncopal episode then remained unresponsive briefly before having a grand mal seizure that lasted approximately 1 minutes.  He received 2.5mg  versed which resolved seizure; however, he remained unresponsive and apneic.   He received narcan 0.4mg  without response.  Due to persistent apnea, he was intubated.  Following intubation, he required propofol and versed due to possible ongoing seizures vs myoclonus vs biting on ETT.  PCCM called for admission and neuro called in consultation given seizures.  Past Medical History  has Testicular/scrotal pain; Microhematuria; Condyloma acuminatum of scrotum; Seizure disorder (HCC); Insulin-requiring or dependent type II diabetes mellitus (HCC); CKD (chronic kidney disease), stage III (HCC); Normocytic anemia; Essential hypertension; Left-sided weakness; Dysarthria; Seizure (HCC); Altered mental status; CVA (cerebral vascular accident) (HCC); Precordial pain; Cardiomyopathy (HCC); Atypical chest pain; Rectal bleeding; AKI (acute kidney injury) (HCC); and Seizures (HCC) on their problem list.  Significant Hospital Events   12/28 > admit  Consults:  Neuro,  PCCM  Procedures:  ETT 12/28 >   Significant Diagnostic Tests:  CXR > atx, possible left basilar infiltrate. CT head 12/28 > neg. EEG 12/28 >   Micro Data:  COVID 12/28 >  Flu 12/28 >  Blood 12/28 >  Sputum 12/28 >   Antimicrobials:    Interim history/subjective:  Awake and following commands this morning. Agitated overnight. Reports no missed doses of keppra as an outpatient and had not had a seizure in a long time.  Objective:  Blood pressure 129/83, pulse (!) 101, temperature 99.14 F (37.3 C), resp. rate 17, height 5\' 7"  (1.702 m), weight 90.5 kg, SpO2 99 %.    Vent Mode: CPAP;PSV FiO2 (%):  [40 %-100 %] 40 % Set Rate:  [18 bmp] 18 bmp Vt Set:  [530 mL] 530 mL PEEP:  [5 cmH20] 5 cmH20 Pressure Support:  [8 cmH20] 8 cmH20 Plateau Pressure:  [19 cmH20-25 cmH20] 19 cmH20   Intake/Output Summary (Last 24 hours) at 12/04/2020 1005 Last data filed at 12/04/2020 0900 Gross per 24 hour  Intake 1409.69 ml  Output 1290 ml  Net 119.69 ml   Filed Weights   12/03/20 1530  Weight: 90.5 kg    Examination: General: middle aged man laying in bed intubated, agitated, RASS +4 Neuro: awake and alert, following commands in extremities, very strong. +Cough.  HEENT: [place]/AT, eyes anicteric, oral mucosa moist. Cardiovascular: tachycardic, regular rhythm Lungs: CTAB, large Vts on SBT Abdomen: soft, NT, ND. Musculoskeletal: normal muscle mass Skin: warm, dry, no rashes  CXR RML opacity, ETT above clavicles.   Assessment & Plan:   Seizure witnessed by EMS - grand mal seizure on  EMS arrival which broke after 2.5mg  versed. Hx migraines, bipolar 1 disorder, TIA. Admission head CT without acute abnormalities. - Neuro consulted- appreciate their recommendations. Keppra increased. - con't seizure precautions - UDS pending - Continue home Clopidogrel - Hold home Gabapentin, Nortriptyline, Percocet, Sertraline, Sumatriptan.  Acute hypoxic respiratory failure requiring intubation -  2/2 above. Mild post-extubation stridor - SBT & SAT > extubated to [place] - steroids x 1 dose, racemic epi x1; con't ICU monitoring  CKD IIIA - strict I/Os - renally dose meds, avoid nephrotoxic meds - con't to monitor  Hx DM; no hyperglycemia - SSI PRN - with unclear baseline insulin regimen, will start lantus 10 units daily - Hold home Farxiga, Humalog, Metformin - checking A1c  Hx HTN, HLD - Continue home Atorvastatin, Clopidogrel. - Hold home Lopressor.  Hx Asthma - stable - Continue home ICS/LABA> convert to nebulized. Can switch back to inhalers today. -PRN albuterol  Lactic acidosis- suspect this is due to seizure. No source of infection noted at this time -no additional follow up needed   Best Practice:  Diet: NPO. Pain/Anxiety/Delirium protocol (if indicated):  VAP protocol (if indicated):  DVT prophylaxis: SCD's / Heparin. GI prophylaxis: PPI. Glucose control: basal bolus Mobility: Bedrest. Last date of multidisciplinary goals of care discussion:  Family and staff present: None. Summary of discussion: None. Follow up goals of care discussion due:  Code Status: Full. Family Communication:  Disposition: ICU.  Labs   CBC: Recent Labs  Lab 12/03/20 1444 12/03/20 1458 12/03/20 1530 12/03/20 1631 12/04/20 0152 12/04/20 0305  WBC 5.6  --   --   --   --  8.8  NEUTROABS 3.2  --   --   --   --   --   HGB 12.1* 12.6* 12.6* 12.2* 12.9* 12.9*  HCT 39.2 37.0* 37.0* 36.0* 38.0* 41.0  MCV 88.3  --   --   --   --  85.8  PLT 171  --   --   --   --  187   Basic Metabolic Panel: Recent Labs  Lab 12/03/20 1444 12/03/20 1458 12/03/20 1530 12/03/20 1631 12/04/20 0152 12/04/20 0305  NA 140 142 141 141 142 140  K 3.7 3.5 4.4 4.6 3.8 3.9  CL 111 109  --   --   --  109  CO2 20*  --   --   --   --  22  GLUCOSE 96 89  --   --   --  106*  BUN 18 20  --   --   --  19  CREATININE 1.45* 1.30*  --   --   --  1.43*  CALCIUM 8.3*  --   --   --   --  9.3  MG  --   --    --   --   --  1.8  PHOS  --   --   --   --   --  3.2   GFR: Estimated Creatinine Clearance: 59.7 mL/min (A) (by C-G formula based on SCr of 1.43 mg/dL (H)). Recent Labs  Lab 12/03/20 1444 12/03/20 1854 12/04/20 0305  WBC 5.6  --  8.8  LATICACIDVEN 2.4* 2.2*  --    Liver Function Tests: Recent Labs  Lab 12/03/20 1444  AST 22  ALT 23  ALKPHOS 48  BILITOT 0.2*  PROT 5.7*  ALBUMIN 3.3*   No results for input(s): LIPASE, AMYLASE in the last 168 hours. Recent Labs  Lab 12/03/20  1444  AMMONIA 41*   ABG    Component Value Date/Time   PHART 7.324 (L) 12/04/2020 0152   PCO2ART 41.0 12/04/2020 0152   PO2ART 74 (L) 12/04/2020 0152   HCO3 21.4 12/04/2020 0152   TCO2 23 12/04/2020 0152   ACIDBASEDEF 5.0 (H) 12/04/2020 0152   O2SAT 94.0 12/04/2020 0152    Coagulation Profile: Recent Labs  Lab 12/03/20 1444  INR 1.4*   Cardiac Enzymes: No results for input(s): CKTOTAL, CKMB, CKMBINDEX, TROPONINI in the last 168 hours. HbA1C: Hgb A1c MFr Bld  Date/Time Value Ref Range Status  12/03/2020 02:44 PM 8.0 (H) 4.8 - 5.6 % Final    Comment:    (NOTE) Pre diabetes:          5.7%-6.4%  Diabetes:              >6.4%  Glycemic control for   <7.0% adults with diabetes   09/03/2020 05:03 AM 7.1 (H) 4.8 - 5.6 % Final    Comment:    (NOTE) Pre diabetes:          5.7%-6.4%  Diabetes:              >6.4%  Glycemic control for   <7.0% adults with diabetes    CBG: Recent Labs  Lab 12/03/20 1959 12/03/20 2344 12/04/20 0346 12/04/20 0833  GLUCAP 128* 100* 92 102*     This patient is critically ill with multiple organ system failure which requires frequent high complexity decision making, assessment, support, evaluation, and titration of therapies. This was completed through the application of advanced monitoring technologies and extensive interpretation of multiple databases. During this encounter critical care time was devoted to patient care services described in this  note for 33 minutes.  Steffanie Dunn, DO 12/04/20 10:44 AM [facility]

## 2020-12-05 DIAGNOSIS — J9600 Acute respiratory failure, unspecified whether with hypoxia or hypercapnia: Secondary | ICD-10-CM

## 2020-12-05 DIAGNOSIS — Z0189 Encounter for other specified special examinations: Secondary | ICD-10-CM

## 2020-12-05 LAB — GLUCOSE, CAPILLARY
Glucose-Capillary: 229 mg/dL — ABNORMAL HIGH (ref 70–99)
Glucose-Capillary: 190 mg/dL — ABNORMAL HIGH (ref 70–99)
Glucose-Capillary: 161 mg/dL — ABNORMAL HIGH (ref 70–99)
Glucose-Capillary: 172 mg/dL — ABNORMAL HIGH (ref 70–99)
Glucose-Capillary: 164 mg/dL — ABNORMAL HIGH (ref 70–99)

## 2020-12-05 LAB — URINE CULTURE: Culture: NO GROWTH

## 2020-12-05 NOTE — Plan of Care (Signed)
  Problem: Clinical Measurements: Goal: Will remain free from infection Outcome: Progressing Goal: Respiratory complications will improve Outcome: Progressing Goal: Cardiovascular complication will be avoided Outcome: Progressing   Problem: Nutrition: Goal: Adequate nutrition will be maintained Outcome: Progressing   Problem: Pain Managment: Goal: General experience of comfort will improve Outcome: Progressing   

## 2020-12-05 NOTE — Progress Notes (Signed)
PROGRESS NOTE  Nicholas [name] GBM:[id number] DOB: 1961-06-28 DOA: 12/03/2020 PCP: Patient, No Pcp Per  Brief History   Pt is encephelopathic; therefore, this HPI is obtained from chart review. Nicholas [name] is a 59 y.o. male who has a PMH including but not limited to seizures, TIA, bipolar 1, DM, CKD, HTN, Cardiomyopathy (see "past medical history" for rest).  He presented to [facility] after he had a syncopal episode at home.  He had apparently called EMS with complaints of a hernia along with dyspnea.  When EMS arrived, he tried to stand up to get on stretcher and when he did, he had a syncopal episode then remained unresponsive briefly before having a grand mal seizure that lasted approximately 1 minutes.  He received 2.5mg  versed which resolved seizure; however, he remained unresponsive and apneic.   He received narcan 0.4mg  without response.  Due to persistent apnea, he was intubated.  Following intubation, he required propofol and versed due to possible ongoing seizures vs myoclonus vs biting on ETT.  PCCM called for admission and neuro called in consultation given seizures.  Past Medical History  has Testicular/scrotal pain; Microhematuria; Condyloma acuminatum of scrotum; Seizure disorder (HCC); Insulin-requiring or dependent type II diabetes mellitus (HCC); CKD (chronic kidney disease), stage III (HCC); Normocytic anemia; Essential hypertension; Left-sided weakness; Dysarthria; Seizure (HCC); Altered mental status; CVA (cerebral vascular accident) (HCC); Precordial pain; Cardiomyopathy (HCC); Atypical chest pain; Rectal bleeding; AKI (acute kidney injury) (HCC); and Seizures (HCC) on their problem list.  The patient was extubated and transferred to the floor on 12/04/2020. 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 They have signed off. They recommend that the patient follow up with his neurologist and psychiatry as outpatient. The patient has been evaluated by  PT/OT. They have recommended Home with home health PT/OT and supervision for mobility. The patient states that he will go home with his [facility]. He is waiting to hear from the [facility].  Consultants  . PCCM . Neurology  Procedures  . Intubation . Mechanical ventilation . Extubation  Antibiotics   Anti-infectives (From admission, onward)   None    .  [name]   Subjective  The patient was resting comfortably. He was complaining of some pain in his throat.   Objective   Vitals:  Vitals:   12/05/20 1526 12/05/20 1547  BP: 119/70 (!) 173/85  Pulse: (!) 59 75  Resp: 18 20  Temp: 97.8 F (36.6 C) 98.1 F (36.7 C)  SpO2: 94% 97%    Exam:  Constitutional:  . Appears calm and comfortable Respiratory:  . CTA bilaterally, no w/r/r.  . Respiratory effort normal. No retractions or accessory muscle use Cardiovascular:  . RRR, no m/r/g . No LE extremity edema   . Normal pedal pulses Abdomen:  . Abdomen appears normal; no tenderness or masses . No hernias . No HSM Musculoskeletal:  . Digits/nails BUE: no clubbing, cyanosis, petechiae, infection . exam of joints, bones, muscles of at least one of following: head/neck, RUE, LUE, RLE, LLE   o strength and tone normal, no atrophy, no abnormal movements o No tenderness, masses o Normal ROM, no contractures  . gait and station Skin:  . No rashes, lesions, ulcers . palpation of skin: no induration or nodules Neurologic:  . CN 2-12 intact . Sensation all 4 extremities intact Psychiatric:  . Mental status o Mood, affect appropriate o Orientation to person, place, time   I have personally reviewed the following:   Today's Data  .  Vitals  Imaging  . CT head . CXR  Cardiology Data  . EKG  Scheduled Meds: . atorvastatin  80 mg Oral Daily  . Chlorhexidine Gluconate Cloth  6 each Topical Daily  . clopidogrel  75 mg Oral Daily  . fluticasone furoate-vilanterol  1 puff Inhalation Daily  . gabapentin  800 mg Oral QHS  .  gabapentin  600 mg Oral Q0600  . heparin  5,000 Units Subcutaneous Q8H  . insulin aspart  0-15 Units Subcutaneous Q4H  . insulin glargine  10 Units Subcutaneous Daily  . sertraline  50 mg Oral Daily   Continuous Infusions: . levETIRAcetam 1,000 mg (12/05/20 1722)  . ondansetron (ZOFRAN) IV      Active Problems:   Seizures (HCC)   LOS: 2 days   A & P  Seizure witnessed by EMS - grand mal seizure on EMS arrival which broke after 2.5mg  versed. Non-epileptic seizures per neurology.  Hx migraines, bipolar 1 disorder, TIA. Admission head CT without acute abnormalities. - Neuro consulted- appreciate their recommendations. Keppra increased. - con't seizure precautions - UDS pending - Continue home Clopidogrel - Hold home Gabapentin, Nortriptyline, Percocet, Sertraline, Sumatriptan.  Acute hypoxic respiratory failure requiring intubation Resolved. - 2/2 above. Mild post-extubation stridor - SBT & SAT > extubated to [place] - steroids x 1 dose, racemic epi x1; con't ICU monitoring  CKD IIIA - strict I/Os - renally dose meds, avoid nephrotoxic meds - con't to monitor  Hx DM; no hyperglycemia - SSI PRN - with unclear baseline insulin regimen, will start lantus 10 units daily - Hold home Farxiga, Humalog, Metformin - checking A1c  Hx HTN, HLD - Continue home Atorvastatin, Clopidogrel. - Hold home Lopressor.  Hx Asthma - stable - Continue home ICS/LABA> convert to nebulized. Can switch back to inhalers today. -PRN albuterol  Lactic acidosis- suspect this is due to seizure. No source of infection noted at this time -no additional follow up needed  I have seen and examined this patient myself. I have spent 38 minutes in his evaluation and care.  DVT prophylaxis: Heparin CODE STATUS: Full Code Family Communication: None available Disposition: Status is: Inpatient  Remains inpatient appropriate because:Unsafe d/c plan   Dispo: The patient is from: Home               Anticipated d/c is to: Home              Anticipated d/c date is: 1 day              Patient currently is medically stable to d/c.  Nicholas Trieu, DO [facility] Direct contact: see [email or web address]  7PM-7AM contact night coverage as above 12/05/2020, 5:47 PM  LOS: 2 days

## 2020-12-05 NOTE — Evaluation (Signed)
Physical Therapy Evaluation Patient Details Name: Nicholas [name] MRN: [id number] DOB: 07/23/1961 Today's Date: 12/05/2020   History of Present Illness  Pt is a 59 y.o. 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 Due to persistent apnea, pt was intubated 12/03/20 and has since been extubated. EEG suggested severe diffuse encephalopathy, nonspecific etiology but likely related to sedation, and no seizures or epileptiform discharges were seen during EEG recording. CT of head negative for acute intracranial abnormalities.  Clinical Impression  Pt presents with condition mentioned above and deficits mentioned below, see PT Problem List. Pt lives alone in an apartment that he has to negotiate ~20 stairs to enter/exit as he is having difficulty getting the complex to place locks on his sliding doors to allow for entry-level access. At baseline, pt is independent with use of a quad-cane, which he reports trips him often, for all functional mobility but he gets assistance from an aide to shower, cook, and clean. Pt reports being able to live with his bishop from his church upon d/c to avoid stairs as the entry to his home is flat. Pt displays bilat leg muscular weakness and balance deficits that impact his safety and independence with mobility. He did not display a LOB but required min guard assist to ambulate and negotiate 2 stairs slowly while using a SPC. Pt displayed R foot drag with gait occasionally, placing him at risk for tripping. Will continue to follow acutely. Recommending pt return home with his Aileen Pilot initially until safe enough to negotiate the 20 stairs to enter his apartment. If he returns home with his bishop then  recommending [facility] PT, but if not then pt may benefit from SNF short-term rehab to maximize his safety and independence before returning home. Pt stated desire to avoid going to a nursing home.    Follow Up Recommendations Home health PT;Supervision for mobility/OOB    Equipment Recommendations  Cane (single-point cane)    Recommendations for Other Services       Precautions / Restrictions Precautions Precautions: Fall Precaution Comments: seizures Restrictions Weight Bearing Restrictions: No      Mobility  Bed Mobility Overal bed mobility: Independent             General bed mobility comments: Pt able to trasnition supine > sit EOB with bed flat and no use of rails safely.    Transfers Overall transfer level: Needs assistance Equipment used: Straight cane Transfers: Sit to/from Stand Sit to Stand: Min guard         General transfer comment: Extra time to power up to stand, no overt LOB. Min guard for safety.  Ambulation/Gait Ambulation/Gait assistance: Min guard Gait Distance (Feet): 130 Feet Assistive device: Straight cane Gait Pattern/deviations: Step-through pattern;Decreased dorsiflexion - right;Decreased step length - right;Decreased stride length;Trunk flexed Gait velocity: decreased Gait velocity interpretation: <1.31 ft/sec, indicative of household ambulator General Gait Details: Pt ambulates with R foot drag on occasion with intermittent improvements. No overt LOB and min guard for safety. Cued pt to increase step length and gait speed, with min improvement.  Stairs Stairs: Yes Stairs assistance: Min guard Stair Management: One rail Right;One rail Left;Step to pattern (R descending, L ascending) Number of Stairs: 2 General stair comments: Ascending and descending 2 stairs with step-to  gait pattern and use of L rail up and R rail down with no overt LOB, min guard for safety. Increased time and cues required to maintain safety, cuing pt to lead up with  strong and down with weaker leg if needed, with success.  Wheelchair Mobility    Modified Rankin (Stroke Patients Only) Modified Rankin (Stroke Patients Only) Pre-Morbid Rankin Score: Slight disability Modified Rankin: Moderately severe disability     Balance Overall balance assessment: Mild deficits observed, not formally tested                                           Pertinent Vitals/Pain Pain Assessment: 0-10 Pain Score: 8  Pain Location: lower back Pain Descriptors / Indicators: Cramping;Aching;Guarding Pain Intervention(s): Limited activity within patient's tolerance;Monitored during session;Repositioned    Home Living Family/patient expects to be discharged to:: Private residence Living Arrangements: Alone Available Help at Discharge: Personal care attendant;Friend(s);Available PRN/intermittently Type of Home: Apartment Home Access: Stairs to enter Entrance Stairs-Rails: Right (descending) Entrance Stairs-Number of Steps: 20 Home Layout: One level Home Equipment: Cane - quad;Bedside commode;Tub bench Additional Comments: reports living in apartment with issues getting complex to change out locks of back sliding door to decrease need to use stairs. Pt reports being able to live with his Bishop from his church at his level-home with level-entry if needed initially. The bishop is available to help PRN.    Prior Function Level of Independence: Independent with assistive device(s)         Comments: Uses quad-cane for mobility but reprots it trips him. Pt receives assistance for showers, cooking, and cleaning from aide ~2hr/day     Hand Dominance   Dominant Hand: Right    Extremity/Trunk Assessment   Upper Extremity Assessment Upper Extremity Assessment: Defer to OT evaluation    Lower Extremity Assessment Lower Extremity Assessment: RLE deficits/detail;LLE deficits/detail RLE Deficits / Details: MMT scores of the following: hip flexion 4-,  knee extension 4-, knee flexion 4-, ankle dorsiflexion 4 RLE Sensation: WNL RLE Coordination: WNL LLE Deficits / Details: MMT scores of the following: hip flexion 4, knee extension 4, knee flexion 4-, ankle dorsiflexion 3+ LLE Sensation: WNL LLE Coordination: WNL    Cervical / Trunk Assessment Cervical / Trunk Assessment: Normal  Communication   Communication: No difficulties  Cognition Arousal/Alertness: Awake/alert Behavior During Therapy: WFL for tasks assessed/performed Overall Cognitive Status: Within Functional Limits for tasks assessed                                 General Comments: A&Ox4. Pt aware of deficits.      General Comments      Exercises     Assessment/Plan    PT Assessment Patient needs continued PT services  PT Problem List Decreased strength;Decreased activity tolerance;Decreased balance;Decreased mobility       PT Treatment Interventions DME instruction;Gait training;Stair training;Functional mobility training;Therapeutic activities;Therapeutic exercise;Balance training;Neuromuscular re-education    PT Goals (Current goals can be found in the Care Plan section)  Acute Rehab PT Goals Patient Stated Goal: to go home and not to nursing home PT Goal Formulation: With patient Time For Goal Achievement: 12/19/20 Potential to Achieve Goals: Good    Frequency Min 3X/week   Barriers to discharge        Co-evaluation  AM-PAC PT "6 Clicks" Mobility  Outcome Measure Help needed turning from your back to your side while in a flat bed without using bedrails?: None Help needed moving from lying on your back to sitting on the side of a flat bed without using bedrails?: None Help needed moving to and from a bed to a chair (including a wheelchair)?: A Little Help needed standing up from a chair using your arms (e.g., wheelchair or bedside chair)?: A Little Help needed to walk in hospital room?: A Little Help needed  climbing 3-5 steps with a railing? : A Little 6 Click Score: 20    End of Session Equipment Utilized During Treatment: Gait belt Activity Tolerance: Patient tolerated treatment well Patient left: in chair;with call bell/phone within reach;with chair alarm set Nurse Communication: Mobility status PT Visit Diagnosis: Unsteadiness on feet (R26.81);Other abnormalities of gait and mobility (R26.89);Muscle weakness (generalized) (M62.81);Difficulty in walking, not elsewhere classified (R26.2)    Time: 7619-5093 PT Time Calculation (min) (ACUTE ONLY): 29 min   Charges:   PT Evaluation $PT Eval Moderate Complexity: 1 Mod PT Treatments $Gait Training: 8-22 mins        Raymond Gurney, PT, DPT Acute Rehabilitation Services  Pager: [phone] Office: [phone]   Jewel Baize 12/05/2020, 12:21 PM

## 2020-12-05 NOTE — Evaluation (Signed)
Occupational Therapy Evaluation Patient Details Name: Nicholas [name] MRN: [id number] DOB: Mar 01, 1961 Today's Date: 12/05/2020    History of Present Illness Patient is a 59 y.o. male presenting with LOC follwed by seizure. Patient intubated on 12/28 2/2 persistent apnea s/p extubation. PMHx significant for HTN, thyroid disease, DMII, bipolar disorder, epilepsy, asthma, Hx of CVA with mild R residual deficits and Hx of COVID-19 in 2020.   Clinical Impression   PTA patient was living in an apartment home with 20 STE and unilateral railing. Patient reports having a PCA 5 days/wk for 2hrs but notes inconsistency with staffing. Patient reports that in the absence of his PCA he is able to bathe and dress himself and complete housekeeping tasks with use of QC and increased time. Patient currently functioning at baseline demonstrating Mod I for short-distance mobility in room, toilet transfers, toileting/hygiene/clothing management and grooming standing at sink level. Mild balance deficits noted without LOB. Patient does not require continued acute occupational therapy services with OT to sign off at this time. Patient to d/c home with a close friend before returning home.     Follow Up Recommendations  No OT follow up;Supervision - Intermittent    Equipment Recommendations  None recommended by OT (Patient has necessary DME)    Recommendations for Other Services       Precautions / Restrictions Precautions Precautions: Fall Precaution Comments: seizures Restrictions Weight Bearing Restrictions: No      Mobility Bed Mobility Overal bed mobility: Independent             General bed mobility comments: Patient seated in recliner upon entry.    Transfers Overall transfer level: Needs assistance Equipment used: Straight cane Transfers: Sit to/from Stand Sit to Stand: Modified independent (Device/Increase time)         General transfer comment: Mod I from low recliner and from  regular height commode in bathroom.    Balance Overall balance assessment: Mild deficits observed, not formally tested                                         ADL either performed or assessed with clinical judgement   ADL Overall ADL's : At baseline                                       General ADL Comments: Patient completed toilet transfer and toileting/hygiene/clothing management with Mod I and use of SPC.     Vision   Vision Assessment?: No apparent visual deficits     Perception     Praxis      Pertinent Vitals/Pain Pain Assessment: 0-10 Pain Score: 8  Pain Location: lower back Pain Descriptors / Indicators: Cramping;Aching;Guarding Pain Intervention(s): Limited activity within patient's tolerance;Monitored during session;Repositioned     Hand Dominance Right   Extremity/Trunk Assessment Upper Extremity Assessment Upper Extremity Assessment: Overall WFL for tasks assessed (Mild residual deficits in RUE from previous CVA.)   Lower Extremity Assessment Lower Extremity Assessment: Defer to PT evaluation RLE Deficits / Details: MMT scores of the following: hip flexion 4-, knee extension 4-, knee flexion 4-, ankle dorsiflexion 4 RLE Sensation: WNL RLE Coordination: WNL LLE Deficits / Details: MMT scores of the following: hip flexion 4, knee extension 4, knee flexion 4-, ankle dorsiflexion 3+ LLE Sensation: WNL LLE Coordination: WNL  Cervical / Trunk Assessment Cervical / Trunk Assessment: Normal   Communication Communication Communication: No difficulties   Cognition Arousal/Alertness: Awake/alert Behavior During Therapy: WFL for tasks assessed/performed Overall Cognitive Status: Within Functional Limits for tasks assessed                                 General Comments: A&Ox4. Pt aware of deficits.   General Comments       Exercises     Shoulder Instructions      Home Living Family/patient expects to  be discharged to:: Other (Comment) (2nd floor apartment) Living Arrangements: Alone Available Help at Discharge: Personal care attendant;Friend(s);Available PRN/intermittently Type of Home: Apartment Home Access: Stairs to enter Entrance Stairs-Number of Steps: 20 Entrance Stairs-Rails: Right (descending) Home Layout: One level     Bathroom Shower/Tub: Chief Strategy Officer: Standard     Home Equipment: Cane - quad;Bedside commode;Tub bench   Additional Comments: reports living in apartment with issues getting complex to change out locks of back sliding door to decrease need to use stairs. Pt reports being able to live with his Bishop from his church at his level-home with level-entry if needed initially. The bishop is available to help PRN.      Prior Functioning/Environment Level of Independence: Independent with assistive device(s)        Comments: QC for mobility in home and community dwellings. PCA ~2hrs day and 5 days/wk for assist with bathing, meal prep and light housekeeping.        OT Problem List:        OT Treatment/Interventions:      OT Goals(Current goals can be found in the care plan section) Acute Rehab OT Goals Patient Stated Goal: To return home. OT Goal Formulation: With patient Time For Goal Achievement: 12/19/20 Potential to Achieve Goals: Good  OT Frequency:     Barriers to D/C:            Co-evaluation              AM-PAC OT "6 Clicks" Daily Activity     Outcome Measure Help from another person eating meals?: None Help from another person taking care of personal grooming?: None Help from another person toileting, which includes using toliet, bedpan, or urinal?: None Help from another person bathing (including washing, rinsing, drying)?: A Little Help from another person to put on and taking off regular upper body clothing?: None Help from another person to put on and taking off regular lower body clothing?: A Little 6  Click Score: 22   End of Session Equipment Utilized During Treatment: Gait belt;Other (comment) [facility])  Activity Tolerance: Patient tolerated treatment well Patient left: in chair;with call bell/phone within reach;with chair alarm set                   Time: 5284-1324 OT Time Calculation (min): 20 min Charges:  OT General Charges $OT Visit: 1 Visit OT Evaluation $OT Eval Low Complexity: 1 Low  Amia Rynders H. OTR/L Supplemental OT, Department of rehab services [phone]  Romulo Okray R H. 12/05/2020, 1:36 PM

## 2020-12-05 NOTE — TOC Initial Note (Signed)
Transition of Care [facility]) - Initial/Assessment Note    Patient Details  Name: Nicholas [name] MRN: [id number] Date of Birth: October 20, 1961  [facility]) CM/SW Contact:    Kermit Balo, RN Phone Number: 12/05/2020, 3:50 PM  Clinical Narrative:                 Pt admitted with seizures. He is from home alone. Plan is for him to d/c to his Bishops home for a few days. HH therapy recommended and pt asked to use [facility] for [facility]. Referral made and accepted.  Cane for home ordered through [facility].  Pt has transportation when medically ready for d/c.   Expected Discharge Plan: Home w Home Health Services Barriers to Discharge: Continued Medical Work up   Patient Goals and CMS Choice   CMS Medicare.gov Compare Post Acute Care list provided to:: Patient Choice offered to / list presented to : Patient  Expected Discharge Plan and Services Expected Discharge Plan: Home w Home Health Services   Discharge Planning Services: CM Consult Post Acute Care Choice: Home Health Living arrangements for the past 2 months: Apartment                 DME Arranged: Gilmer Mor DME Agency: [employer] Date DME Agency Contacted: 12/05/20   Representative spoke with at DME Agency: Velna Hatchet HH Arranged: PT HH Agency: [facility] Date [facility] Contacted: 12/05/20      Prior Living Arrangements/Services Living arrangements for the past 2 months: Apartment Lives with:: Self Patient language and need for interpreter reviewed:: Yes Do you feel safe going back to the place where you live?: Yes      Need for Family Participation in Patient Care: Yes (Comment) Care giver support system in place?: Yes (comment) (going to his Bishops home)   Criminal Activity/Legal Involvement Pertinent to Current Situation/Hospitalization: No - Comment as needed  Activities of Daily Living Home Assistive Devices/Equipment: None ADL Screening (condition at time of admission) Patient's cognitive ability  adequate to safely complete daily activities?: Yes Is the patient deaf or have difficulty hearing?: No Does the patient have difficulty seeing, even when wearing glasses/contacts?: No Does the patient have difficulty concentrating, remembering, or making decisions?: No Patient able to express need for assistance with ADLs?: Yes Does the patient have difficulty dressing or bathing?: No Independently performs ADLs?: Yes (appropriate for developmental age) Does the patient have difficulty walking or climbing stairs?: No Weakness of Legs: None Weakness of Arms/Hands: None  Permission Sought/Granted                  Emotional Assessment Appearance:: Appears stated age Attitude/Demeanor/Rapport: Engaged Affect (typically observed): Accepting Orientation: : Oriented to Self,Oriented to Place,Oriented to  Time,Oriented to Situation   Psych Involvement: No (comment)  Admission diagnosis:  Seizure (HCC) [R56.9] Seizures (HCC) [R56.9] Acute respiratory failure, unspecified whether with hypoxia or hypercapnia (HCC) [J96.00] Altered mental status, unspecified altered mental status type [R41.82] Patient Active Problem List   Diagnosis Date Noted  . Seizures (HCC) 12/03/2020  . Rectal bleeding 10/31/2020  . AKI (acute kidney injury) (HCC) 10/31/2020  . Cardiomyopathy (HCC) 09/23/2020  . Atypical chest pain 09/23/2020  . Precordial pain   . CVA (cerebral vascular accident) (HCC) 09/02/2020  . Altered mental status 07/23/2020  . Seizure (HCC) 07/22/2020  . Left-sided weakness 07/04/2020  . Dysarthria 07/04/2020  . Essential hypertension   . Seizure disorder (HCC) 07/02/2017  . Insulin-requiring or dependent type II diabetes mellitus (  HCC) 07/02/2017  . CKD (chronic kidney disease), stage III (HCC) 07/02/2017  . Normocytic anemia 07/02/2017  . Testicular/scrotal pain 11/09/2013  . Microhematuria 11/09/2013  . Condyloma acuminatum of scrotum 11/09/2013   PCP:  Patient, No Pcp  Per Pharmacy:   [facility] - [place], [place] - [address] [facility] [address] [place] [address] Phone: [phone] Fax: [phone]     Social Determinants of Health (SDOH) Interventions    Readmission Risk Interventions No flowsheet data found.

## 2020-12-06 DIAGNOSIS — Z0189 Encounter for other specified special examinations: Secondary | ICD-10-CM | POA: Diagnosis not present

## 2020-12-06 DIAGNOSIS — J9600 Acute respiratory failure, unspecified whether with hypoxia or hypercapnia: Secondary | ICD-10-CM | POA: Diagnosis not present

## 2020-12-06 LAB — GLUCOSE, CAPILLARY
Glucose-Capillary: 104 mg/dL — ABNORMAL HIGH (ref 70–99)
Glucose-Capillary: 298 mg/dL — ABNORMAL HIGH (ref 70–99)
Glucose-Capillary: 82 mg/dL (ref 70–99)

## 2020-12-06 MED ORDER — GABAPENTIN 400 MG PO CAPS: 800.0000 mg | ORAL_CAPSULE | Freq: Every day | ORAL | 0 refills | Status: DC

## 2020-12-06 MED ORDER — GABAPENTIN 600 MG PO TABS: 600.0000 mg | ORAL_TABLET | Freq: Every day | ORAL | 0 refills | Status: AC

## 2020-12-06 NOTE — Progress Notes (Signed)
Patient ready for discharge; discharge instructions given and reviewed; rx sent electronically; patient discharged out via wheelchair accompanied by his bishop.

## 2020-12-06 NOTE — Care Management Important Message (Signed)
Important Message  Patient Details  Name: Nicholas [name] MRN: [id number] Date of Birth: 11/30/1961   Medicare Important Message Given:  Yes  Patient left prior to IM delivery.  IM will be mailed the patient home address.    Atilano Covelli 12/06/2020, 2:34 PM

## 2020-12-06 NOTE — Discharge Summary (Signed)
Physician Discharge Summary  Valla Leaverdward W Lippman ZOX:[id number] DOB: 08-15-1961 DOA: 12/03/2020  PCP: Patient, No Pcp Per  Admit date: 12/03/2020 Discharge date: 12/06/2020  Recommendations for Outpatient Follow-up:  1. Discharge to home with supervision for mobility 2. Home health PT/OT and cane 3. No driving until at least 6 months following most recent seizure. 4. Follow up with PCP in 7-10 days. Have chemistry drawn and reported to PCP on that date. 5. Follow up with pt own neurologist in 4-6 weeks. 6. Follow up with psychiatry as outpatient at soonest available appointment.   Follow-up Information    [facility].   Specialty: Home Health Services Why: The home health agency will contact you for the first home visit. Contact information: [address] [place] [address] [phone]              Discharge Diagnoses: Principal diagnosis is #1 1. Non-epileptic seizure, pseudoseizure. 2. Bipolar disorder 3. Migraine 4. Acute hypoxic respiratory failure requiring intubation 5. CKD IIIa 6. DM II 7. Hypertension 8. Hyperlipidemia 9. Asthma 10. Lactic acidosis  Discharge Condition: Fair  Disposition: Home with his friend, "the Bishop".  Diet recommendation: Heart healthy with modified carbohydrates.  Filed Weights   12/03/20 1530 12/05/20 0454  Weight: 90.5 kg 91.4 kg    History of present illness: Pt is encephelopathic; therefore, this HPI is obtained from chart review: Valla Leaverdward W Whelchel is a 59 y.o. male who has a PMH including but not limited to seizures, TIA, bipolar 1, DM, CKD, HTN, Cardiomyopathy (see "past medical history" for rest).  [facility] after he had a syncopal episode at home.  He had apparently called EMS with complaints of a hernia along with dyspnea.  When EMS arrived, he tried to stand up to get on stretcher and when he did, he had a syncopal episode then remained unresponsive briefly before having a grand  mal seizure that lasted approximately 1 minutes.  He received 2.5mg  versed which resolved seizure; however, he remained unresponsive and apneic.   He received narcan 0.4mg  without response.  Due to persistent apnea, he was intubated.  Following intubation, he required propofol and versed due to possible ongoing seizures vs myoclonus vs biting on ETT.  PCCM called for admission and neuro called in consultation given seizures.  Hospital Course: has Testicular/scrotal pain; Microhematuria; Condyloma acuminatum of scrotum; Seizure disorder (HCC); Insulin-requiring or dependent type II diabetes mellitus (HCC); CKD (chronic kidney disease), stage III (HCC); Normocytic anemia; Essential hypertension; Left-sided weakness; Dysarthria; Seizure (HCC); Altered mental status; CVA (cerebral vascular accident) (HCC); Precordial pain; Cardiomyopathy (HCC); Atypical chest pain; Rectal bleeding; AKI (acute kidney injury) (HCC); and Seizures (HCC) on their problem list.  The patient was extubated and transferred to the floor on 12/04/2020. 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 They have signed off. They recommend that the patient follow up with his neurologist and psychiatry as outpatient. The patient has been evaluated by PT/OT. They have recommended Home with home health PT/OT and supervision for mobility. The patient states that he will go home with his Bishop.   He will be discharged to home with the Bishop today.   Today's assessment: S: The patient is resting comfortably. No new complaints. O: Vitals:  Vitals:   12/06/20 0407 12/06/20 0813  BP: 111/73 120/81  Pulse: 61 74  Resp: 17 18  Temp: (!) 97.5 F (36.4 C) 97.7 F (36.5 C)  SpO2: 95% 98%   Exam:  Constitutional:   Appears calm and comfortable Respiratory:   CTA bilaterally, no w/r/r.   Respiratory effort normal. No retractions or accessory muscle use Cardiovascular:   RRR, no m/r/g  No LE extremity  edema    Normal pedal pulses Abdomen:   Abdomen appears normal; no tenderness or masses  No hernias  No HSM Musculoskeletal:   Digits/nails BUE: no clubbing, cyanosis, petechiae, infection  exam of joints, bones, muscles of at least one of following: head/neck, RUE, LUE, RLE, LLE   ? strength and tone normal, no atrophy, no abnormal movements ? No tenderness, masses ? Normal ROM, no contractures   gait and station Skin:   No rashes, lesions, ulcers  palpation of skin: no induration or nodules Neurologic:   CN 2-12 intact  Sensation all 4 extremities intact Psychiatric:   Mental status ? Mood, affect appropriate ? Orientation to person, place, time  Discharge Instructions  Discharge Instructions    Activity as tolerated - No restrictions   Complete by: As directed    Call MD for:  persistant nausea and vomiting   Complete by: As directed    Call MD for:  temperature >100.4   Complete by: As directed    Diet - low sodium heart healthy   Complete by: As directed    Diet Carb Modified   Complete by: As directed    Discharge instructions   Complete by: As directed    Discharge to home with supervision for mobility Home health PT/OT and cane No driving until at least 6 months following most recent seizure. Follow up with PCP in 7-10 days. Have chemistry drawn and reported to PCP on that date. Follow up with pt own neurologist in 4-6 weeks. Follow up with psychiatry as outpatient at soonest available appointment.   Increase activity slowly   Complete by: As directed      Allergies as of 12/06/2020      Reactions   Finasteride Other (See Comments)   Break out   Levothyroxine Anaphylaxis, Cough   Chronic cough   Nsaids Other (See Comments)   D/t gastric ulcer   Amoxicillin Itching, Other (See Comments)   THRUSH Causes sores in mouth   Ampicillin Other (See Comments)   THRUSH   Penicillins Itching, Other (See Comments)   THRUSH- Causes sores in mouth    Strawberry Extract Swelling   LIPS SWELL   Asa [aspirin] Other (See Comments)   Bleeding   Bactrim [sulfamethoxazole-trimethoprim] Hives, Itching, Other (See Comments)   GI upset   Dapagliflozin    Other reaction(s): Other (See Comments) Allergic to steroids and had mouth broke out.    Depakote [divalproex Sodium]    Causes double vision and speech problems    Dilantin [phenytoin] Other (See Comments)   Severe skin peeling   Methocarbamol Other (See Comments)   dizziness   Other    Med for prostate that startes with a M- Broke out the insid eof mouth, split the tongue, and caused throudh   Risperidone And Related Other (See Comments)   Hallucinations   Sitagliptin Other (See Comments)   pancreatitis   Strawberry (diagnostic) Itching, Swelling   Sulfa Antibiotics Other (See Comments)   Affects thyroid   Tolmetin Other (See Comments)   D/t gastric ulcer   Ultram [tramadol Hcl] Other (See Comments)   D/t gastric ulcer   Buprenorphine Itching, Rash, Other (See Comments)   "Patches broke me out in red patches-  caused a fever, also"  Oatmeal Hives, Other (See Comments)   White spots/sores in mouth      Medication List    STOP taking these medications   metoprolol tartrate 100 MG tablet Commonly known as: LOPRESSOR   valsartan 160 MG tablet Commonly known as: DIOVAN     TAKE these medications   albuterol 108 (90 Base) MCG/ACT inhaler Commonly known as: VENTOLIN HFA Inhale 1-2 puffs into the lungs every 6 (six) hours as needed for wheezing or shortness of breath.   atorvastatin 80 MG tablet Commonly known as: LIPITOR Take 1 tablet (80 mg total) by mouth at bedtime. What changed: when to take this   budesonide-formoterol 80-4.5 MCG/ACT inhaler Commonly known as: SYMBICORT Inhale 2 puffs into the lungs daily.   clopidogrel 75 MG tablet Commonly known as: PLAVIX Take 1 tablet (75 mg total) by mouth daily.   Farxiga 10 MG Tabs tablet Generic drug: dapagliflozin  propanediol Take 10 mg by mouth daily.   fenofibrate 145 MG tablet Commonly known as: TRICOR Take 1 tablet (145 mg total) by mouth daily.   gabapentin 400 MG capsule Commonly known as: NEURONTIN Take 2 capsules (800 mg total) by mouth at bedtime. What changed:   how much to take  Another medication with the same name was removed. Continue taking this medication, and follow the directions you see here.   gabapentin 600 MG tablet Commonly known as: NEURONTIN Take 1 tablet (600 mg total) by mouth daily at 6 (six) AM. What changed:   how much to take  when to take this  additional instructions  Another medication with the same name was removed. Continue taking this medication, and follow the directions you see here.   HumaLOG KwikPen 100 UNIT/ML KwikPen Generic drug: insulin lispro Inject 0-14 Units into the skin 4 (four) times daily as needed (high blood sugar). Inject into the skin up to 4 times a day, per sliding scale: BGL 160 or greater = 10 units; 200 or greater = 14 units   latanoprost 0.005 % ophthalmic solution Commonly known as: XALATAN Place 1 drop into both eyes at bedtime.   levETIRAcetam 250 MG tablet Commonly known as: KEPPRA Take 250 mg by mouth in the morning and at bedtime.   metFORMIN 1000 MG tablet Commonly known as: GLUCOPHAGE Take 500 mg by mouth 2 (two) times daily.   metoCLOPramide 10 MG tablet Commonly known as: REGLAN Take 10 mg by mouth 4 (four) times daily.   nortriptyline 50 MG capsule Commonly known as: PAMELOR Take 100 mg by mouth at bedtime.   oxyCODONE-acetaminophen 10-325 MG tablet Commonly known as: PERCOCET Take 1.5 tablets by mouth every 6 (six) hours as needed for pain.   pantoprazole 40 MG tablet Commonly known as: PROTONIX Take 40 mg by mouth daily.   polyethylene glycol powder 17 GM/SCOOP powder Commonly known as: GLYCOLAX/MIRALAX Take 17 g by mouth daily as needed for mild constipation.   sertraline 50 MG  tablet Commonly known as: ZOLOFT Take 50 mg by mouth daily.   SUMAtriptan 100 MG tablet Commonly known as: IMITREX Take 1 tablet (100 mg total) by mouth daily as needed for migraine or headache. May repeat in 2 hours if headache persists or recurs.   tamsulosin 0.4 MG Caps capsule Commonly known as: FLOMAX Take 0.4 mg by mouth in the morning and at bedtime.   timolol 0.5 % ophthalmic solution Commonly known as: TIMOPTIC Place 1 drop into both eyes 2 (two) times daily.   zonisamide 50 MG capsule  Commonly known as: ZONEGRAN Take 100 mg by mouth at bedtime.      Allergies  Allergen Reactions  . Finasteride Other (See Comments)    Break out  . Levothyroxine Anaphylaxis and Cough    Chronic cough  . Nsaids Other (See Comments)    D/t gastric ulcer  . Amoxicillin Itching and Other (See Comments)    THRUSH Causes sores in mouth  . Ampicillin Other (See Comments)    THRUSH  . Penicillins Itching and Other (See Comments)    THRUSH- Causes sores in mouth  . Strawberry Extract Swelling    LIPS SWELL  . Asa [Aspirin] Other (See Comments)    Bleeding   . Bactrim [Sulfamethoxazole-Trimethoprim] Hives, Itching and Other (See Comments)    GI upset  . Dapagliflozin     Other reaction(s): Other (See Comments) Allergic to steroids and had mouth broke out.   . Depakote [Divalproex Sodium]     Causes double vision and speech problems   . Dilantin [Phenytoin] Other (See Comments)    Severe skin peeling  . Methocarbamol Other (See Comments)    dizziness  . Other     Med for prostate that startes with a M- Broke out the insid eof mouth, split the tongue, and caused throudh  . Risperidone And Related Other (See Comments)    Hallucinations   . Sitagliptin Other (See Comments)    pancreatitis  . Strawberry (Diagnostic) Itching and Swelling  . Sulfa Antibiotics Other (See Comments)    Affects thyroid   . Tolmetin Other (See Comments)    D/t gastric ulcer  . Ultram [Tramadol Hcl]  Other (See Comments)    D/t gastric ulcer  . Buprenorphine Itching, Rash and Other (See Comments)    "Patches broke me out in red patches-  caused a fever, also"  . Oatmeal Hives and Other (See Comments)    White spots/sores in mouth    The results of significant diagnostics from this hospitalization (including imaging, microbiology, ancillary and laboratory) are listed below for reference.    Significant Diagnostic Studies: EEG  Result Date: 12/03/2020 Charlsie Quest, MD     12/03/2020  6:25 PM Patient Name: JABBAR PALMERO MRN: [id number] Epilepsy Attending: Charlsie Quest Referring Physician/Provider: Dr Caryl Pina Date: 12/03/2020 Duration: 23.03 mins Patient history: 59yo M with seizure like activity. EEG to evaluate for seizure. Level of alertness:  comatose AEDs during EEG study: LEV, Propofol Technical aspects: This EEG study was done with scalp electrodes positioned according to the 10-20 International system of electrode placement. Electrical activity was acquired at a sampling rate of  and reviewed with a high frequency filter of  and a low frequency filter of . EEG data were recorded continuously and digitally stored. Description: EEG showed continuous generalized 2-3 Hz delta slowing with overriding 15-18hz  generalized beta activity.  Hyperventilation and photic stimulation were not performed.   ABNORMALITY -Excessive beta, generalized -Continuous slow, generalized IMPRESSION: This study is suggestive of severe diffuse encephalopathy, nonspecific etiology but likely related to sedation. No seizures or epileptiform discharges were seen throughout the recording. Charlsie Quest   CT Head Wo Contrast  Result Date: 12/03/2020 CLINICAL DATA:  Syncope, seizure activity, left far Ree Kida EXAM: CT HEAD WITHOUT CONTRAST TECHNIQUE: Contiguous axial images were obtained from the base of the skull through the vertex without intravenous contrast. COMPARISON:  10/15/2020 FINDINGS:  Brain: No acute infarct or hemorrhage. Lateral ventricles and midline structures are unremarkable. No acute extra-axial fluid collections.  No mass effect. Vascular: No hyperdense vessel or unexpected calcification. Skull: Normal. Negative for fracture or focal lesion. Sinuses/Orbits: No acute finding. Other: None. IMPRESSION: 1. Stable head CT, no acute process. Electronically Signed   By: Sharlet Salina M.D.   On: 12/03/2020 15:20   DG Chest Port 1 View  Result Date: 12/04/2020 CLINICAL DATA:  59 year old male with seizure.  Respiratory failure. EXAM: PORTABLE CHEST 1 VIEW COMPARISON:  Portable chest 12/03/2020 and earlier. FINDINGS: Portable AP semi upright view at 0518 hours. Endotracheal tube tip has been retracted to just above the level of the clavicles. Enteric tube placed into the stomach, tip not included. Lower lung volumes. Mediastinal contours remain within normal limits. Mild bibasilar opacity most resembling atelectasis. Elsewhere Allowing for portable technique the lungs are clear. Paucity of bowel gas in the upper abdomen. IMPRESSION: 1. Endotracheal tube tip retracted to just above the clavicles. Recommend advancement of 1-2 cm for more optimal placement. 2. Enteric tube placed into the stomach, tip not included. 3. Lower lung volumes with mild bibasilar atelectasis. Electronically Signed   By: Odessa Fleming M.D.   On: 12/04/2020 07:08   DG Chest Portable 1 View  Result Date: 12/03/2020 CLINICAL DATA:  Intubation. EXAM: PORTABLE CHEST 1 VIEW COMPARISON:  Chest x-ray 10/15/2020. FINDINGS: Endotracheal tube tip noted in the orifice of the right mainstem bronchus. Proximal repositioning of approximately 5 cm suggested. Cardiomegaly with pulmonary venous congestion. Low lung volumes with bilateral subsegmental atelectasis. Left base infiltrate cannot be excluded. Bilateral pleural thickening noted. No pleural effusion or pneumothorax. IMPRESSION: 1. Endotracheal tube tip noted in the orifice of the  right mainstem bronchus. Proximal repositioning of approximately 5 cm suggested. 2. Cardiomegaly with pulmonary venous congestion. 3. Low lung volumes with bilateral subsegmental atelectasis. Left base infiltrate cannot be excluded. Electronically Signed   By: Maisie Fus  Register   On: 12/03/2020 14:05   DG Abd Portable 1V  Result Date: 12/03/2020 CLINICAL DATA:  Enteric catheter placement, seizure, lethargic EXAM: PORTABLE ABDOMEN - 1 VIEW COMPARISON:  08/29/2018 FINDINGS: Frontal view of the lower chest and upper abdomen excludes the right flank by collimation. Tip and side port of an enteric catheter project over the gastric body. Bowel gas pattern is unremarkable. IMPRESSION: 1. Enteric catheter projecting over gastric body. Electronically Signed   By: Sharlet Salina M.D.   On: 12/03/2020 19:35    Microbiology: Recent Results (from the past 240 hour(s))  Blood Cultures (routine x 2)     Status: None (Preliminary result)   Collection Time: 12/03/20  2:45 PM   Specimen: BLOOD  Result Value Ref Range Status   Specimen Description BLOOD RIGHT ANTECUBITAL  Final   Special Requests   Final    BOTTLES DRAWN AEROBIC AND ANAEROBIC Blood Culture adequate volume   Culture   Final    NO GROWTH 3 DAYS Performed at [facility], [address], [place], [place] [address]    Report Status PENDING  Incomplete  Blood Cultures (routine x 2)     Status: None (Preliminary result)   Collection Time: 12/03/20  2:46 PM   Specimen: BLOOD LEFT HAND  Result Value Ref Range Status   Specimen Description BLOOD LEFT HAND  Final   Special Requests   Final    BOTTLES DRAWN AEROBIC AND ANAEROBIC Blood Culture adequate volume   Culture   Final    NO GROWTH 3 DAYS Performed at [facility], [address] [place], [place] [address]  Report Status PENDING  Incomplete  Resp Panel by RT-PCR (Flu A&B, Covid) Nasopharyngeal Swab     Status: None   Collection Time: 12/03/20  2:52 PM   Specimen:  Nasopharyngeal Swab; Nasopharyngeal(NP) swabs in vial transport medium  Result Value Ref Range Status   SARS Coronavirus 2 by RT PCR NEGATIVE NEGATIVE Final    Comment: (NOTE) SARS-CoV-2 target nucleic acids are NOT DETECTED.  The SARS-CoV-2 RNA is generally detectable in upper respiratory specimens during the acute phase of infection. The lowest concentration of SARS-CoV-2 viral copies this assay can detect is 138 copies/mL. A negative result does not preclude SARS-Cov-2 infection and should not be used as the sole basis for treatment or other patient management decisions. A negative result may occur with  improper specimen collection/handling, submission of specimen other than nasopharyngeal swab, presence of viral mutation(s) within the areas targeted by this assay, and inadequate number of viral copies(<138 copies/mL). A negative result must be combined with clinical observations, patient history, and epidemiological information. The expected result is Negative.  Fact Sheet for Patients:  BloggerCourse.com  Fact Sheet for Healthcare Providers:  SeriousBroker.it  This test is no t yet approved or cleared by the [place] FDA and  has been authorized for detection and/or diagnosis of SARS-CoV-2 by FDA under an Emergency Use Authorization (EUA). This EUA will remain  in effect (meaning this test can be used) for the duration of the COVID-19 declaration under Section 564(b)(1) of the Act, 21 U.S.C.section 360bbb-3(b)(1), unless the authorization is terminated  or revoked sooner.       Influenza A by PCR NEGATIVE NEGATIVE Final   Influenza B by PCR NEGATIVE NEGATIVE Final    Comment: (NOTE) The Xpert Xpress SARS-CoV-2/FLU/RSV plus assay is intended as an aid in the diagnosis of influenza from Nasopharyngeal swab specimens and should not be used as a sole basis for treatment. Nasal washings and aspirates are unacceptable for  Xpert Xpress SARS-CoV-2/FLU/RSV testing.  Fact Sheet for Patients: BloggerCourse.com  Fact Sheet for Healthcare Providers: SeriousBroker.it  This test is not yet approved or cleared by the [place] FDA and has been authorized for detection and/or diagnosis of SARS-CoV-2 by FDA under an Emergency Use Authorization (EUA). This EUA will remain in effect (meaning this test can be used) for the duration of the COVID-19 declaration under Section 564(b)(1) of the Act, 21 U.S.C. section 360bbb-3(b)(1), unless the authorization is terminated or revoked.  Performed at [facility], [address] [place], [place] [address]   Urine culture     Status: None   Collection Time: 12/03/20  4:06 PM   Specimen: Urine, Catheterized  Result Value Ref Range Status   Specimen Description URINE, CATHETERIZED  Final   Special Requests NONE  Final   Culture   Final    NO GROWTH Performed at [facility], [address] [place], [place] [address]    Report Status 12/05/2020 FINAL  Final  MRSA PCR Screening     Status: None   Collection Time: 12/03/20  5:06 PM   Specimen: Nasopharyngeal  Result Value Ref Range Status   MRSA by PCR NEGATIVE NEGATIVE Final    Comment:        The GeneXpert MRSA Assay (FDA approved for NASAL specimens only), is one component of a comprehensive MRSA colonization surveillance program. It is not intended to diagnose MRSA infection nor to guide or monitor treatment for MRSA infections. Performed at [facility], [address] [place], [place] [address]   Culture, blood (routine x 2)     Status: None (Preliminary result)   Collection Time: 12/03/20  6:46 PM   Specimen: BLOOD  Result Value Ref Range Status   Specimen Description BLOOD LEFT ANTECUBITAL  Final   Special Requests   Final    BOTTLES DRAWN AEROBIC AND ANAEROBIC Blood Culture adequate volume   Culture   Final    NO GROWTH 3  DAYS Performed at [facility], [address] [place], [place] [address]    Report Status PENDING  Incomplete  Culture, blood (routine x 2)     Status: None (Preliminary result)   Collection Time: 12/03/20  7:00 PM   Specimen: BLOOD LEFT ARM  Result Value Ref Range Status   Specimen Description BLOOD LEFT ARM  Final   Special Requests   Final    BOTTLES DRAWN AEROBIC ONLY Blood Culture adequate volume   Culture   Final    NO GROWTH 3 DAYS Performed at [facility], [address], [place], [place] [address]    Report Status PENDING  Incomplete     Labs: Basic Metabolic Panel: Recent Labs  Lab 12/03/20 1444 12/03/20 1458 12/03/20 1530 12/03/20 1631 12/04/20 0152 12/04/20 0305  NA 140 142 141 141 142 140  K 3.7 3.5 4.4 4.6 3.8 3.9  CL 111 109  --   --   --  109  CO2 20*  --   --   --   --  22  GLUCOSE 96 89  --   --   --  106*  BUN 18 20  --   --   --  19  CREATININE 1.45* 1.30*  --   --   --  1.43*  CALCIUM 8.3*  --   --   --   --  9.3  MG  --   --   --   --   --  1.8  PHOS  --   --   --   --   --  3.2   Liver Function Tests: Recent Labs  Lab 12/03/20 1444  AST 22  ALT 23  ALKPHOS 48  BILITOT 0.2*  PROT 5.7*  ALBUMIN 3.3*   No results for input(s): LIPASE, AMYLASE in the last 168 hours. Recent Labs  Lab 12/03/20 1444  AMMONIA 41*   CBC: Recent Labs  Lab 12/03/20 1444 12/03/20 1458 12/03/20 1530 12/03/20 1631 12/04/20 0152 12/04/20 0305  WBC 5.6  --   --   --   --  8.8  NEUTROABS 3.2  --   --   --   --   --   HGB 12.1* 12.6* 12.6* 12.2* 12.9* 12.9*  HCT 39.2 37.0* 37.0* 36.0* 38.0* 41.0  MCV 88.3  --   --   --   --  85.8  PLT 171  --   --   --   --  187   Cardiac Enzymes: No results for input(s): CKTOTAL, CKMB, CKMBINDEX, TROPONINI in the last 168 hours. BNP: BNP (last 3 results) No results for input(s): BNP in the last 8760 hours.  ProBNP (last 3 results) No results for input(s): PROBNP in the last 8760 hours.  CBG: Recent  Labs  Lab 12/05/20 1542 12/05/20 1943 12/06/20 0029 12/06/20 0407 12/06/20 0806  GLUCAP 172* 164* 298* 82 104*    Active Problems:   Seizures (HCC)   Time coordinating discharge: 38 minutes.  Signed:        Deslyn Cavenaugh, DO [facility]  12/06/2020, 4:54 PM

## 2020-12-08 LAB — CULTURE, BLOOD (ROUTINE X 2)
Culture: NO GROWTH
Culture: NO GROWTH
Culture: NO GROWTH
Culture: NO GROWTH
Special Requests: ADEQUATE
Special Requests: ADEQUATE
Special Requests: ADEQUATE
Special Requests: ADEQUATE

## 2020-12-11 ENCOUNTER — Institutional Professional Consult (permissible substitution): Payer: Medicare Other | Admitting: Pulmonary Disease

## 2020-12-11 NOTE — Progress Notes (Incomplete)
Synopsis: Referred in 12/2020 for evaluation of COPD by Arthur Holms, NP  Subjective:   PATIENT ID: Nicholas [name] GENDER: male DOB: 1961/09/24, MRN: [id number]   HPI  No chief complaint on file.  Nicholas [name] is a 60 year old male, former smoker with diabetes mellitus type II, seizure disorder and bipolar disorder who is referred to pulmonary clinic for evaluation of COPD.   He had recent admission 12/03/20 to 12/06/20 for altered mental status concerning for seizures requiring intubation and mechanical ventilation. Neurology evaluated the patient and determined he was having non-epileptic seizures.       Past Medical History:  Diagnosis Date  . Asthma   . Bipolar 1 disorder ([place])   . Borderline glaucoma   . Chronic pain   . Epididymal pain    LEFT  . Epilepsy, grand mal ([place]) DX AGE 9---  LAST SEIZURE 1 WK AGO (APPROX ,  10-31-2013)   NO NEUROLOGIST---  PT SEES PCP  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  . Feeling of incomplete bladder emptying   . Frequency of urination   . Gastric ulcer   . GERD (gastroesophageal reflux disease)   . Hypertension   . Hyperthyroidism    NO MEDS   . Migraine   . Seizures ([place])   . TIA (transient ischemic attack)   . Type 2 diabetes mellitus ([facility])      Family History  Problem Relation Age of Onset  . Heart failure Mother   . Diabetes Mother   . Hypertension Mother   . Cirrhosis Father   . Heart failure Father   . Kidney disease Father      Social History   Socioeconomic History  . Marital status: Widowed    Spouse name: Not on file  . Number of children: Not on file  . Years of education: Not on file  . Highest education level: Not on file  Occupational History  . Not on file  Tobacco Use  . Smoking status: Former Smoker    Packs/day: 0.25    Years: 15.00    Pack years: 3.75    Types: Cigarettes    Quit date: 11/08/1992    Years since quitting: 28.1  . Smokeless tobacco: Never Used  Vaping Use  . Vaping Use: Never used  Substance and  Sexual Activity  . Alcohol use: No  . Drug use: No  . Sexual activity: Not Currently  Other Topics Concern  . Not on file  Social History Narrative   ** Merged History Encounter **       Social Determinants of Health   Financial Resource Strain: Not on file  Food Insecurity: Not on file  Transportation Needs: Not on file  Physical Activity: Not on file  Stress: Not on file  Social Connections: Not on file  Intimate Partner Violence: Not on file     Allergies  Allergen Reactions  . Finasteride Other (See Comments)    Break out  . Levothyroxine Anaphylaxis and Cough    Chronic cough  . Nsaids Other (See Comments)    D/t gastric ulcer  . Amoxicillin Itching and Other (See Comments)    THRUSH Causes sores in mouth  . Ampicillin Other (See Comments)    THRUSH  . Penicillins Itching and Other (See Comments)    THRUSH- Causes sores in mouth  . Strawberry Extract Swelling    LIPS SWELL  . Asa [Aspirin] Other (See Comments)    Bleeding   . Bactrim [Sulfamethoxazole-Trimethoprim] Hives, Itching  and Other (See Comments)    GI upset  . Dapagliflozin     Other reaction(s): Other (See Comments) Allergic to steroids and had mouth broke out.   . Depakote [Divalproex Sodium]     Causes double vision and speech problems   . Dilantin [Phenytoin] Other (See Comments)    Severe skin peeling  . Methocarbamol Other (See Comments)    dizziness  . Other     Med for prostate that startes with a M- Broke out the insid eof mouth, split the tongue, and caused throudh  . Risperidone And Related Other (See Comments)    Hallucinations   . Sitagliptin Other (See Comments)    pancreatitis  . Strawberry (Diagnostic) Itching and Swelling  . Sulfa Antibiotics Other (See Comments)    Affects thyroid   . Tolmetin Other (See Comments)    D/t gastric ulcer  . Ultram [Tramadol Hcl] Other (See Comments)    D/t gastric ulcer  . Buprenorphine Itching, Rash and Other (See Comments)    "Patches  broke me out in red patches-  caused a fever, also"  . Oatmeal Hives and Other (See Comments)    White spots/sores in mouth     Outpatient Medications Prior to Visit  Medication Sig Dispense Refill  . albuterol (PROVENTIL HFA;VENTOLIN HFA) 108 (90 Base) MCG/ACT inhaler Inhale 1-2 puffs into the lungs every 6 (six) hours as needed for wheezing or shortness of breath. 1 Inhaler 0  . atorvastatin (LIPITOR) 80 MG tablet Take 1 tablet (80 mg total) by mouth at bedtime. (Patient taking differently: Take 80 mg by mouth daily.) 30 tablet 3  . budesonide-formoterol (SYMBICORT) 80-4.5 MCG/ACT inhaler Inhale 2 puffs into the lungs daily.    . clopidogrel (PLAVIX) 75 MG tablet Take 1 tablet (75 mg total) by mouth daily. 30 tablet 01  . FARXIGA 10 MG TABS tablet Take 10 mg by mouth daily.    . fenofibrate (TRICOR) 145 MG tablet Take 1 tablet (145 mg total) by mouth daily. 30 tablet 0  . gabapentin (NEURONTIN) 400 MG capsule Take 2 capsules (800 mg total) by mouth at bedtime. 60 capsule 0  . gabapentin (NEURONTIN) 600 MG tablet Take 1 tablet (600 mg total) by mouth daily at 6 (six) AM. 30 tablet 0  . HUMALOG KWIKPEN 100 UNIT/ML KwikPen Inject 0-14 Units into the skin 4 (four) times daily as needed (high blood sugar). Inject into the skin up to 4 times a day, per sliding scale: BGL 160 or greater = 10 units; 200 or greater = 14 units    . latanoprost (XALATAN) 0.005 % ophthalmic solution Place 1 drop into both eyes at bedtime.     . levETIRAcetam (KEPPRA) 250 MG tablet Take 250 mg by mouth in the morning and at bedtime.    . metFORMIN (GLUCOPHAGE) 1000 MG tablet Take 500 mg by mouth 2 (two) times daily.    . metoCLOPramide (REGLAN) 10 MG tablet Take 10 mg by mouth 4 (four) times daily.    . nortriptyline (PAMELOR) 50 MG capsule Take 100 mg by mouth at bedtime.     [name] oxyCODONE-acetaminophen (PERCOCET) 10-325 MG tablet Take 1.5 tablets by mouth every 6 (six) hours as needed for pain.    . pantoprazole  (PROTONIX) 40 MG tablet Take 40 mg by mouth daily.    . polyethylene glycol powder (GLYCOLAX/MIRALAX) 17 GM/SCOOP powder Take 17 g by mouth daily as needed for mild constipation.    . sertraline (ZOLOFT) 50 MG  tablet Take 50 mg by mouth daily.    . SUMAtriptan (IMITREX) 100 MG tablet Take 1 tablet (100 mg total) by mouth daily as needed for migraine or headache. May repeat in 2 hours if headache persists or recurs. 20 tablet 0  . tamsulosin (FLOMAX) 0.4 MG CAPS capsule Take 0.4 mg by mouth in the morning and at bedtime.    . timolol (TIMOPTIC) 0.5 % ophthalmic solution Place 1 drop into both eyes 2 (two) times daily.     [name] zonisamide (ZONEGRAN) 50 MG capsule Take 100 mg by mouth at bedtime.     No facility-administered medications prior to visit.    ROS    Objective:  There were no vitals filed for this visit.   Physical Exam    CBC    Component Value Date/Time   WBC 8.8 12/04/2020 0305   RBC 4.78 12/04/2020 0305   HGB 12.9 (L) 12/04/2020 0305   HGB 13.7 12/31/2014 2304   HCT 41.0 12/04/2020 0305   HCT 41.9 12/31/2014 2304   PLT 187 12/04/2020 0305   PLT 150 12/31/2014 2304   MCV 85.8 12/04/2020 0305   MCV 85 12/31/2014 2304   MCH 27.0 12/04/2020 0305   MCHC 31.5 12/04/2020 0305   RDW 14.6 12/04/2020 0305   RDW 15.7 (H) 12/31/2014 2304   LYMPHSABS 1.9 12/03/2020 1444   MONOABS 0.4 12/03/2020 1444   EOSABS 0.1 12/03/2020 1444   BASOSABS 0.0 12/03/2020 1444   BMP Latest Ref Rng & Units 12/04/2020 12/04/2020 12/03/2020  Glucose 70 - 99 mg/dL 106(Y) - -  BUN 6 - 20 mg/dL 19 - -  Creatinine 6.94 - 1.24 mg/dL 8.54(O) - -  Sodium 270 - 145 mmol/L 140 142 141  Potassium 3.5 - 5.1 mmol/L 3.9 3.8 4.6  Chloride 98 - 111 mmol/L 109 - -  CO2 22 - 32 mmol/L 22 - -  Calcium 8.9 - 10.3 mg/dL 9.3 - -     Chest imaging: CXR 12/04/20 Portable AP semi upright view at 0518 hours. Endotracheal tube tip has been retracted to just above the level of the clavicles. Enteric tube  placed into the stomach, tip not included.  Lower lung volumes. Mediastinal contours remain within normal limits. Mild bibasilar opacity most resembling atelectasis. Elsewhere Allowing for portable technique the lungs are clear.  Paucity of bowel gas in the upper abdomen.  CXR 10/15/20 There is no edema or airspace opacity. Heart is slightly enlarged with pulmonary vascularity normal. No adenopathy. No pneumothorax. No bone lesions.  PFT: No flowsheet data found.  Echo: 09/03/20 1. Left ventricular ejection fraction, by estimation, is 45 to 50%. The  left ventricle has mildly decreased function. The left ventricle  demonstrates regional wall motion abnormalities (see scoring  diagram/findings for description). There is mild left  ventricular hypertrophy. Left ventricular diastolic parameters are  consistent with Grade I diastolic dysfunction (impaired relaxation). There  is moderate hypokinesis of the left ventricular, basal-mid inferolateral  wall and inferior wall suggesting  possible circumflex territory ischemia  2. Right ventricular systolic function is mildly reduced. The right  ventricular size is normal. There is normal pulmonary artery systolic  pressure. The estimated right ventricular systolic pressure is 23.2 mmHg.  3. The mitral valve is abnormal. Trivial mitral valve regurgitation.  4. The aortic valve is tricuspid. Aortic valve regurgitation is not  visualized.  5. The inferior vena cava is normal in size with greater than 50%  respiratory variability, suggesting right atrial pressure of 3  mmHg.   Comparison(s): Changes from prior study are noted. 07/05/2017: LVEF 60-65%.     Assessment & Plan:   No diagnosis found.  Discussion: ***    Current Outpatient Medications:  .  albuterol (PROVENTIL HFA;VENTOLIN HFA) 108 (90 Base) MCG/ACT inhaler, Inhale 1-2 puffs into the lungs every 6 (six) hours as needed for wheezing or shortness of breath., Disp: 1  Inhaler, Rfl: 0 .  atorvastatin (LIPITOR) 80 MG tablet, Take 1 tablet (80 mg total) by mouth at bedtime. (Patient taking differently: Take 80 mg by mouth daily.), Disp: 30 tablet, Rfl: 3 .  budesonide-formoterol (SYMBICORT) 80-4.5 MCG/ACT inhaler, Inhale 2 puffs into the lungs daily., Disp: , Rfl:  .  clopidogrel (PLAVIX) 75 MG tablet, Take 1 tablet (75 mg total) by mouth daily., Disp: 30 tablet, Rfl: 01 .  FARXIGA 10 MG TABS tablet, Take 10 mg by mouth daily., Disp: , Rfl:  .  fenofibrate (TRICOR) 145 MG tablet, Take 1 tablet (145 mg total) by mouth daily., Disp: 30 tablet, Rfl: 0 .  gabapentin (NEURONTIN) 400 MG capsule, Take 2 capsules (800 mg total) by mouth at bedtime., Disp: 60 capsule, Rfl: 0 .  gabapentin (NEURONTIN) 600 MG tablet, Take 1 tablet (600 mg total) by mouth daily at 6 (six) AM., Disp: 30 tablet, Rfl: 0 .  HUMALOG KWIKPEN 100 UNIT/ML KwikPen, Inject 0-14 Units into the skin 4 (four) times daily as needed (high blood sugar). Inject into the skin up to 4 times a day, per sliding scale: BGL 160 or greater = 10 units; 200 or greater = 14 units, Disp: , Rfl:  .  latanoprost (XALATAN) 0.005 % ophthalmic solution, Place 1 drop into both eyes at bedtime. , Disp: , Rfl:  .  levETIRAcetam (KEPPRA) 250 MG tablet, Take 250 mg by mouth in the morning and at bedtime., Disp: , Rfl:  .  metFORMIN (GLUCOPHAGE) 1000 MG tablet, Take 500 mg by mouth 2 (two) times daily., Disp: , Rfl:  .  metoCLOPramide (REGLAN) 10 MG tablet, Take 10 mg by mouth 4 (four) times daily., Disp: , Rfl:  .  nortriptyline (PAMELOR) 50 MG capsule, Take 100 mg by mouth at bedtime. , Disp: , Rfl:  .  oxyCODONE-acetaminophen (PERCOCET) 10-325 MG tablet, Take 1.5 tablets by mouth every 6 (six) hours as needed for pain., Disp: , Rfl:  .  pantoprazole (PROTONIX) 40 MG tablet, Take 40 mg by mouth daily., Disp: , Rfl:  .  polyethylene glycol powder (GLYCOLAX/MIRALAX) 17 GM/SCOOP powder, Take 17 g by mouth daily as needed for mild  constipation., Disp: , Rfl:  .  sertraline (ZOLOFT) 50 MG tablet, Take 50 mg by mouth daily., Disp: , Rfl:  .  SUMAtriptan (IMITREX) 100 MG tablet, Take 1 tablet (100 mg total) by mouth daily as needed for migraine or headache. May repeat in 2 hours if headache persists or recurs., Disp: 20 tablet, Rfl: 0 .  tamsulosin (FLOMAX) 0.4 MG CAPS capsule, Take 0.4 mg by mouth in the morning and at bedtime., Disp: , Rfl:  .  timolol (TIMOPTIC) 0.5 % ophthalmic solution, Place 1 drop into both eyes 2 (two) times daily. , Disp: , Rfl:  .  zonisamide (ZONEGRAN) 50 MG capsule, Take 100 mg by mouth at bedtime., Disp: , Rfl:

## 2020-12-31 ENCOUNTER — Encounter: Payer: Self-pay | Admitting: *Deleted

## 2021-01-02 ENCOUNTER — Encounter: Payer: Self-pay | Admitting: Neurology

## 2021-01-02 ENCOUNTER — Ambulatory Visit: Payer: Medicare Other | Admitting: Neurology

## 2021-01-02 ENCOUNTER — Telehealth: Payer: Self-pay | Admitting: *Deleted

## 2021-01-02 NOTE — Telephone Encounter (Signed)
No showed new patient appointment. 

## 2021-01-15 ENCOUNTER — Encounter: Payer: Self-pay | Admitting: Neurology

## 2021-01-16 ENCOUNTER — Ambulatory Visit (INDEPENDENT_AMBULATORY_CARE_PROVIDER_SITE_OTHER): Payer: Medicare Other

## 2021-01-16 ENCOUNTER — Ambulatory Visit (INDEPENDENT_AMBULATORY_CARE_PROVIDER_SITE_OTHER): Payer: Medicare Other | Admitting: Podiatry

## 2021-01-16 ENCOUNTER — Other Ambulatory Visit: Payer: Self-pay

## 2021-01-16 ENCOUNTER — Encounter: Payer: Self-pay | Admitting: Podiatry

## 2021-01-16 ENCOUNTER — Telehealth: Payer: Self-pay | Admitting: Podiatry

## 2021-01-16 DIAGNOSIS — L600 Ingrowing nail: Secondary | ICD-10-CM | POA: Diagnosis not present

## 2021-01-16 DIAGNOSIS — M79672 Pain in left foot: Secondary | ICD-10-CM

## 2021-01-16 DIAGNOSIS — M79671 Pain in right foot: Secondary | ICD-10-CM

## 2021-01-16 DIAGNOSIS — M722 Plantar fascial fibromatosis: Secondary | ICD-10-CM | POA: Diagnosis not present

## 2021-01-16 NOTE — Telephone Encounter (Signed)
He doesn't need them

## 2021-01-16 NOTE — Patient Instructions (Signed)

## 2021-01-16 NOTE — Telephone Encounter (Signed)
The pharmacy called saying the patient said drops were supposed to be sent out and they are following up on it.

## 2021-01-16 NOTE — Progress Notes (Signed)
Subjective:   Patient ID: Nicholas [name], male   DOB: 60 y.o.   MRN: [id number]   HPI Patient presents with a painful ingrown toenail of the left big toe and also a lot of discomfort underneath the left over the right foot stating its been sore and making it hard for him to walk comfortably   Review of Systems  All other systems reviewed and are negative.       Objective:  Physical Exam Vitals and nursing note reviewed.  Constitutional:      Appearance: He is well-developed and well-nourished.  Cardiovascular:     Pulses: Intact distal pulses.  Pulmonary:     Effort: Pulmonary effort is normal.  Musculoskeletal:        General: Normal range of motion.  Skin:    General: Skin is warm.  Neurological:     Mental Status: He is alert.     Neurovascular status intact with patient found to have incurvated left hallux medial border painful when pressed and is also noted to have inflammation pain of the plantar fascial distal portion at its insertion into the first metatarsal head left with mild discomfort also on the right foot not to the same degree     Assessment:  Chronic ingrown toenail deformity left hallux with painful right fourth nail secondary to trauma with inflammation of the plantar fascial distal aspect of the fascia as it inserts into the first metatarsal left     Plan:  Neurovascular status intact muscle strength found to be adequate range of motion found to be adequate.  Patient is found to have incurvated left hallux which I discussed with him and I recommended correction allowed him to read consent form for correction and explained procedure risk.  I infiltrated the left hallux 60 mg like Marcaine mixture sterile prep done and using sterile instrumentation removed the medial border exposed matrix applied phenol 3 applications 30 seconds followed by alcohol lavage and sterile dressing.  Gave instructions on soaks reappoint and I went ahead today also and I injected the  distal fascial left 3 mg Dexasone Kenalog 5 g Xylocaine advised on rigid bottom shoes reappoint if symptoms indicate  X-rays indicate moderate depression of the arch no indications of fracture with moderate spur formation bilateral

## 2021-01-22 ENCOUNTER — Emergency Department (HOSPITAL_COMMUNITY): Payer: Medicare Other

## 2021-01-22 ENCOUNTER — Other Ambulatory Visit: Payer: Self-pay

## 2021-01-22 ENCOUNTER — Encounter (HOSPITAL_COMMUNITY): Payer: Self-pay | Admitting: Emergency Medicine

## 2021-01-22 ENCOUNTER — Emergency Department (HOSPITAL_COMMUNITY)
Admission: EM | Admit: 2021-01-22 | Discharge: 2021-01-22 | Disposition: A | Discharge status: Home / Self Care | Payer: Medicare Other | Attending: Emergency Medicine | Admitting: Emergency Medicine

## 2021-01-22 DIAGNOSIS — M79676 Pain in unspecified toe(s): Secondary | ICD-10-CM | POA: Insufficient documentation

## 2021-01-22 DIAGNOSIS — Z5321 Procedure and treatment not carried out due to patient leaving prior to being seen by health care provider: Secondary | ICD-10-CM | POA: Insufficient documentation

## 2021-01-22 LAB — URINALYSIS, ROUTINE W REFLEX MICROSCOPIC
Bacteria, UA: NONE SEEN
Bilirubin Urine: NEGATIVE
Glucose, UA: >=500 mg/dL — AB
Hgb urine dipstick: NEGATIVE
Ketones, ur: NEGATIVE mg/dL
Leukocytes,Ua: NEGATIVE
Nitrite: NEGATIVE
Protein, ur: NEGATIVE mg/dL
Specific Gravity, Urine: 1.033 — ABNORMAL HIGH (ref 1.005–1.030)
pH: 5 (ref 5.0–8.0)

## 2021-01-22 LAB — COMPREHENSIVE METABOLIC PANEL
ALT: 39 U/L (ref 0–44)
AST: 27 U/L (ref 15–41)
Albumin: 4.1 g/dL (ref 3.5–5.0)
Alkaline Phosphatase: 56 U/L (ref 38–126)
Anion gap: 12 (ref 5–15)
BUN: 13 mg/dL (ref 6–20)
CO2: 26 mmol/L (ref 22–32)
Calcium: 9.7 mg/dL (ref 8.9–10.3)
Chloride: 103 mmol/L (ref 98–111)
Creatinine, Ser: 1.38 mg/dL — ABNORMAL HIGH (ref 0.61–1.24)
GFR, Estimated: 59 mL/min — ABNORMAL LOW (ref >60)
Glucose, Bld: 132 mg/dL — ABNORMAL HIGH (ref 70–99)
Potassium: 3.8 mmol/L (ref 3.5–5.1)
Sodium: 141 mmol/L (ref 135–145)
Total Bilirubin: 0.5 mg/dL (ref 0.3–1.2)
Total Protein: 7.1 g/dL (ref 6.5–8.1)

## 2021-01-22 LAB — CBC WITH DIFFERENTIAL/PLATELET
Abs Immature Granulocytes: 0.05 10*3/uL (ref 0.00–0.07)
Basophils Absolute: 0 10*3/uL (ref 0.0–0.1)
Basophils Relative: 0 %
Eosinophils Absolute: 0.1 10*3/uL (ref 0.0–0.5)
Eosinophils Relative: 1 %
HCT: 43.6 % (ref 39.0–52.0)
Hemoglobin: 13.8 g/dL (ref 13.0–17.0)
Immature Granulocytes: 1 %
Lymphocytes Relative: 20 %
Lymphs Abs: 1.9 10*3/uL (ref 0.7–4.0)
MCH: 27.4 pg (ref 26.0–34.0)
MCHC: 31.7 g/dL (ref 30.0–36.0)
MCV: 86.7 fL (ref 80.0–100.0)
Monocytes Absolute: 0.6 10*3/uL (ref 0.1–1.0)
Monocytes Relative: 7 %
Neutro Abs: 6.9 10*3/uL (ref 1.7–7.7)
Neutrophils Relative %: 71 %
Platelets: 187 10*3/uL (ref 150–400)
RBC: 5.03 MIL/uL (ref 4.22–5.81)
RDW: 15.3 % (ref 11.5–15.5)
WBC: 9.7 10*3/uL (ref 4.0–10.5)
nRBC: 0 % (ref 0.0–0.2)

## 2021-01-22 LAB — LACTIC ACID, PLASMA: Lactic Acid, Venous: 0.9 mmol/L (ref 0.5–1.9)

## 2021-01-22 NOTE — ED Triage Notes (Signed)
Patient complains of L toe pain that has now radiated into his foot, pain started on Sunday, has been soaking foot x2 per MD order, has been draining since Monday with pus, reports some swelling and throbbing pain, finished course of abx last Sunday and went to [facility], hx of DM and concern for worsening infection

## 2021-01-22 NOTE — ED Notes (Signed)
Pt did not want to wait and left. 

## 2021-01-22 NOTE — ED Triage Notes (Signed)
Per [facility] pt arrives for toe pain , recent infection.

## 2021-01-24 ENCOUNTER — Ambulatory Visit (INDEPENDENT_AMBULATORY_CARE_PROVIDER_SITE_OTHER): Payer: Medicare Other | Admitting: Podiatry

## 2021-01-24 ENCOUNTER — Other Ambulatory Visit: Payer: Self-pay

## 2021-01-24 DIAGNOSIS — L600 Ingrowing nail: Secondary | ICD-10-CM

## 2021-01-24 MED ORDER — OXYCODONE-ACETAMINOPHEN 5-325 MG PO TABS
1.0000 | ORAL_TABLET | ORAL | 0 refills | Status: DC | PRN
Start: 2021-01-24 — End: 2022-05-08

## 2021-02-02 ENCOUNTER — Encounter: Payer: Self-pay | Admitting: Podiatry

## 2021-02-02 NOTE — Progress Notes (Signed)
Subjective:  Patient ID: Nicholas [name], male    DOB: 12-23-1960,  MRN: [id number]  Chief Complaint  Patient presents with  . Ingrown Toenail    Left hallux nail ingrown. PT had an ingrown procedure done last week  and is still having pain.     60 y.o. male presents with the above complaint.  Patient presents with a follow-up of left hallux pain that seems to have gone worse if not about the same.  Patient states that nothing has helped with the pain.  He had a nail procedure done by Dr. Charlsie Merles.  He denies any other acute complaints.  Pain medication/ibuprofen is not helping him.  He would like to know if there is anything else going on.  He denies any other acute complaints he has done some of his soaks and has been keeping covered with Neosporin/triple antibiotic and a Band-Aid.   Review of Systems: Negative except as noted in the HPI. Denies N/V/F/Ch.  Past Medical History:  Diagnosis Date  . Asthma   . Bipolar 1 disorder ([facility])   . Borderline glaucoma   . Chronic pain   . Epididymal pain    LEFT  . 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 Feeling of incomplete bladder emptying   . Frequency of urination   . Gastric ulcer   . GERD (gastroesophageal reflux disease)   . Hypertension   . Hyperthyroidism    NO MEDS   . Migraine   . Seizures ([facility])   . TIA (transient ischemic attack)   . Type 2 diabetes mellitus ([facility])     Current Outpatient Medications:  .  oxyCODONE-acetaminophen (PERCOCET) 5-325 MG tablet, Take 1-2 tablets by mouth every 4 (four) hours as needed for severe pain., Disp: 30 tablet, Rfl: 0 .  albuterol (PROVENTIL HFA;VENTOLIN HFA) 108 (90 Base) MCG/ACT inhaler, Inhale 1-2 puffs into the lungs every 6 (six) hours as needed for wheezing or shortness of breath., Disp: 1 Inhaler, Rfl: 0 .  atorvastatin (LIPITOR) 80 MG tablet, Take 1 tablet (80 mg total) by mouth at bedtime.  (Patient taking differently: Take 80 mg by mouth daily.), Disp: 30 tablet, Rfl: 3 .  budesonide-formoterol (SYMBICORT) 80-4.5 MCG/ACT inhaler, Inhale 2 puffs into the lungs daily., Disp: , Rfl:  .  clopidogrel (PLAVIX) 75 MG tablet, Take 1 tablet (75 mg total) by mouth daily., Disp: 30 tablet, Rfl: 01 .  FARXIGA 10 MG TABS tablet, Take 10 mg by mouth daily., Disp: , Rfl:  .  fenofibrate (TRICOR) 145 MG tablet, Take 1 tablet (145 mg total) by mouth daily., Disp: 30 tablet, Rfl: 0 .  gabapentin (NEURONTIN) 400 MG capsule, Take 2 capsules (800 mg total) by mouth at bedtime., Disp: 60 capsule, Rfl: 0 .  gabapentin (NEURONTIN) 600 MG tablet, Take 1 tablet (600 mg total) by mouth daily at 6 (six) AM., Disp: 30 tablet, Rfl: 0 .  HUMALOG KWIKPEN 100 UNIT/ML KwikPen, Inject 0-14 Units into the skin 4 (four) times daily as needed (high blood sugar). Inject into the skin up to 4 times a day, per sliding scale: BGL 160 or greater = 10 units; 200 or greater = 14 units, Disp: , Rfl:  .  latanoprost (XALATAN) 0.005 % ophthalmic solution, Place 1 drop into both eyes at bedtime. , Disp: , Rfl:  .  levETIRAcetam (KEPPRA) 250 MG tablet, Take  250 mg by mouth in the morning and at bedtime., Disp: , Rfl:  .  metFORMIN (GLUCOPHAGE) 1000 MG tablet, Take 500 mg by mouth 2 (two) times daily., Disp: , Rfl:  .  metoCLOPramide (REGLAN) 10 MG tablet, Take 10 mg by mouth 4 (four) times daily., Disp: , Rfl:  .  nortriptyline (PAMELOR) 50 MG capsule, Take 100 mg by mouth at bedtime. , Disp: , Rfl:  .  oxyCODONE-acetaminophen (PERCOCET) 10-325 MG tablet, Take 1.5 tablets by mouth every 6 (six) hours as needed for pain., Disp: , Rfl:  .  pantoprazole (PROTONIX) 40 MG tablet, Take 40 mg by mouth daily., Disp: , Rfl:  .  polyethylene glycol powder (GLYCOLAX/MIRALAX) 17 GM/SCOOP powder, Take 17 g by mouth daily as needed for mild constipation., Disp: , Rfl:  .  sertraline (ZOLOFT) 50 MG tablet, Take 50 mg by mouth daily., Disp: , Rfl:   .  SUMAtriptan (IMITREX) 100 MG tablet, Take 1 tablet (100 mg total) by mouth daily as needed for migraine or headache. May repeat in 2 hours if headache persists or recurs., Disp: 20 tablet, Rfl: 0 .  tamsulosin (FLOMAX) 0.4 MG CAPS capsule, Take 0.4 mg by mouth in the morning and at bedtime., Disp: , Rfl:  .  timolol (TIMOPTIC) 0.5 % ophthalmic solution, Place 1 drop into both eyes 2 (two) times daily. , Disp: , Rfl:  .  zonisamide (ZONEGRAN) 50 MG capsule, Take 100 mg by mouth at bedtime., Disp: , Rfl:   Social History   Tobacco Use  Smoking Status Former Smoker  . Packs/day: 0.25  . Years: 15.00  . Pack years: 3.75  . Types: Cigarettes  . Quit date: 11/08/1992  . Years since quitting: 28.2  Smokeless Tobacco Never Used    Allergies  Allergen Reactions  . Finasteride Other (See Comments)    Break out  . Levothyroxine Anaphylaxis and Cough    Chronic cough  . Nsaids Other (See Comments)    D/t gastric ulcer  . Amoxicillin Itching and Other (See Comments)    THRUSH Causes sores in mouth  . Ampicillin Other (See Comments)    THRUSH  . Penicillins Itching and Other (See Comments)    THRUSH- Causes sores in mouth  . Strawberry Extract Swelling    LIPS SWELL  . Asa [Aspirin] Other (See Comments)    Bleeding   . Bactrim [Sulfamethoxazole-Trimethoprim] Hives, Itching and Other (See Comments)    GI upset  . Dapagliflozin     Other reaction(s): Other (See Comments) Allergic to steroids and had mouth broke out.   . Depakote [Divalproex Sodium]     Causes double vision and speech problems   . Dilantin [Phenytoin] Other (See Comments)    Severe skin peeling  . Methocarbamol Other (See Comments)    dizziness  . Other     Med for prostate that startes with a M- Broke out the insid eof mouth, split the tongue, and caused throudh  . Risperidone And Related Other (See Comments)    Hallucinations   . Sitagliptin Other (See Comments)    pancreatitis  . Strawberry (Diagnostic)  Itching and Swelling  . Sulfa Antibiotics Other (See Comments)    Affects thyroid   . Tolmetin Other (See Comments)    D/t gastric ulcer  . Ultram [Tramadol Hcl] Other (See Comments)    D/t gastric ulcer  . Buprenorphine Itching, Rash and Other (See Comments)    "Patches broke me out in red patches-  caused  a fever, also"  . Oatmeal Hives and Other (See Comments)    White spots/sores in mouth   Objective:  There were no vitals filed for this visit. There is no height or weight on file to calculate BMI. Constitutional Well developed. Well nourished.  Vascular Dorsalis pedis pulses palpable bilaterally. Posterior tibial pulses palpable bilaterally. Capillary refill normal to all digits.  No cyanosis or clubbing noted. Pedal hair growth normal.  Neurologic Normal speech. Oriented to person, place, and time. Epicritic sensation to light touch grossly present bilaterally.  Dermatologic  no redness noted at the proximal nail fold.  Pain on palpation noted to the nail proximal margin.  No pain on the lateral or medial border of the nail.  No signs of recurrent ingrown nail.  No wounds noted.  Orthopedic: Normal joint ROM without pain or crepitus bilaterally. No visible deformities. No bony tenderness.   Radiographs: None Assessment:   1. Ingrown left big toenail    Plan:  Patient was evaluated and treated and all questions answered.  Left hallux ingrown toenail status post avulsion with possible phenol reaction -I explained to patient the etiology and various treatment options were discussed with phenol reaction.  I encouraged him to continue doing some soaks and keeping it covered.  He can hold off on the soaks as the area over the nail was removed seems to be improving and healing well.  I discussed with him to keep it covered for now.  I also mentioned that it is only 1 week since he had the procedure done.  It is not uncommon for the pain to proceed.  I discussed with him that I  can give him some temporary pain medication to help with it.  He states understanding and would like to proceed with the pain medication to help with the pain. -Also placed him in a surgical shoe to allow proper offloading of the hallux.  I instructed him to continue wearing the surgical shoe until the pain regresses.  Patient states understanding.  No follow-ups on file.

## 2021-02-07 ENCOUNTER — Encounter: Payer: Self-pay | Admitting: Podiatry

## 2021-02-07 ENCOUNTER — Ambulatory Visit (INDEPENDENT_AMBULATORY_CARE_PROVIDER_SITE_OTHER): Payer: Medicare Other | Admitting: Podiatry

## 2021-02-07 ENCOUNTER — Ambulatory Visit (INDEPENDENT_AMBULATORY_CARE_PROVIDER_SITE_OTHER): Payer: Medicare Other

## 2021-02-07 ENCOUNTER — Other Ambulatory Visit: Payer: Self-pay

## 2021-02-07 DIAGNOSIS — M779 Enthesopathy, unspecified: Secondary | ICD-10-CM | POA: Diagnosis not present

## 2021-02-07 DIAGNOSIS — L600 Ingrowing nail: Secondary | ICD-10-CM

## 2021-02-07 MED ORDER — DOXYCYCLINE HYCLATE 100 MG PO TABS: 100.0000 mg | ORAL_TABLET | Freq: Two times a day (BID) | ORAL | 0 refills | Status: DC

## 2021-02-07 MED ORDER — OXYCODONE-ACETAMINOPHEN 10-325 MG PO TABS: 1.0000 | ORAL_TABLET | ORAL | 0 refills | Status: DC | PRN

## 2021-02-10 ENCOUNTER — Telehealth: Payer: Self-pay | Admitting: Podiatry

## 2021-02-10 NOTE — Telephone Encounter (Signed)
[facility] called to notify us that Nicholas [name] was getting oxycodone from a pain medication from another physician office somewhat regularly for the last few months. The other physician gave him 2 week supply(double dose), just on 01/30/2021 he was prescribed the same medication that Dr. Charlsie Merles prescribed on 02/07/2021. Pharmacy was calling to see if he had any upcoming sx. They just wanted to know why he getting prescribed all this Percocet at one time. They explained to Nicholas [name] that they would not refill the prescription that was giving on 02/07/2021 by Dr. Charlsie Merles. He explained to pharmacist he had a cut on his foot.  Please give [facility] a call to notify us of the situation [phone].

## 2021-02-10 NOTE — Progress Notes (Signed)
Subjective:   Patient ID: Nicholas [name], male   DOB: 60 y.o.   MRN: [id number]   HPI Patient states his left big toe which was removed as far as the nail goes several weeks ago has been sore and he wanted to have it checked.  States that he has not had a lot of drainage but it can be bothersome with shoe gear   ROS      Objective:  Physical Exam  Neurovascular status intact with irritation of the left hallux nail bed with no active drainage noted no edema erythema but it is tender and I was not able to find any area where there was fluid that had formed     Assessment:  Probability for low-grade paronychia left hallux creating discomfort     Plan:  Reviewed condition I do think it should heal uneventfully but working to go ahead and as precautionary measure put him on antibiotics now and he is placed on doxycycline given instructions for soaks open toed shoes and if it is not healing will have to consider other treatments.  I do not see anything I can do for him now but he is encouraged to call with any concerns if it does not improve  X-rays were negative for signs that there is osteolysis or indications of bony pathology associated with his discomfort

## 2021-02-12 ENCOUNTER — Encounter: Payer: Self-pay | Admitting: Podiatry

## 2021-02-12 ENCOUNTER — Other Ambulatory Visit: Payer: Self-pay

## 2021-02-12 ENCOUNTER — Ambulatory Visit (INDEPENDENT_AMBULATORY_CARE_PROVIDER_SITE_OTHER): Payer: Medicare Other | Admitting: Podiatry

## 2021-02-12 DIAGNOSIS — L03032 Cellulitis of left toe: Secondary | ICD-10-CM

## 2021-02-12 NOTE — Telephone Encounter (Signed)
Does not need if he has from another physician

## 2021-02-12 NOTE — Progress Notes (Signed)
Subjective:   Patient ID: Nicholas [name], male   DOB: 60 y.o.   MRN: [id number]   HPI Patient presents stating that still has been hurting him and he thought he has seen a worsening of the drainage in the hallux   ROS      Objective:  Physical Exam  Neurovascular status remains intact with patient found to have drainage on the medial side of the left hallux nail bed where previous ingrown was removed.  It appears to be localized with no proximal edema erythema or drainage noted currently     Assessment:  Possibility for a low-grade paronychia of this area that may have occurred during the postoperative.  When he was soaking     Plan:  Since it is not responding so far to the antibiotics and is so sore for him I did go ahead today I infiltrated 60 mg like Marcaine mixture sterile prep done to the toe and using sterile instrumentation I opened up the medial side I cleaned that out.  I did not note any excessive pus or anything from that standpoint but I did clean the area out flushed it applied sterile dressing instructed that this should solve his problem but I cannot make any long-term guarantees of any redness swelling drainage were to occur again I need to see him back

## 2021-02-12 NOTE — Patient Instructions (Signed)

## 2021-02-13 NOTE — Telephone Encounter (Signed)
Spoke to [place], the pharmacist at [facility] to inform her not to fill Dr. Beverlee Nims Rx for oxycodone. Nicholas [name] verbalized understanding and stated she would make a note in the patient's chart stating not to fill pain medication from our office if it has already been prescribed by another provider.

## 2021-02-13 NOTE — Telephone Encounter (Signed)
Ok thank you 

## 2021-02-27 ENCOUNTER — Telehealth: Payer: Self-pay | Admitting: *Deleted

## 2021-02-27 DIAGNOSIS — Z006 Encounter for examination for normal comparison and control in clinical research program: Secondary | ICD-10-CM

## 2021-02-27 NOTE — Telephone Encounter (Signed)
I called patient for 90 day Identify Study phone call.Patient stated he is still having same chest pain that he had prior to Cardiac CT. Patient has not been to another doctor for the symptoms. I reminded we would call patient in Nov 2022 for 1 -year follow-up for the Identify Study.

## 2021-03-03 ENCOUNTER — Emergency Department (HOSPITAL_COMMUNITY): Payer: Medicare Other

## 2021-03-03 ENCOUNTER — Emergency Department (HOSPITAL_COMMUNITY)
Admission: EM | Admit: 2021-03-03 | Discharge: 2021-03-03 | Disposition: A | Discharge status: Home / Self Care | Payer: Medicare Other | Attending: Emergency Medicine | Admitting: Emergency Medicine

## 2021-03-03 DIAGNOSIS — I129 Hypertensive chronic kidney disease with stage 1 through stage 4 chronic kidney disease, or unspecified chronic kidney disease: Secondary | ICD-10-CM | POA: Insufficient documentation

## 2021-03-03 DIAGNOSIS — E1122 Type 2 diabetes mellitus with diabetic chronic kidney disease: Secondary | ICD-10-CM | POA: Insufficient documentation

## 2021-03-03 DIAGNOSIS — K219 Gastro-esophageal reflux disease without esophagitis: Secondary | ICD-10-CM | POA: Diagnosis not present

## 2021-03-03 DIAGNOSIS — N183 Chronic kidney disease, stage 3 unspecified: Secondary | ICD-10-CM | POA: Diagnosis not present

## 2021-03-03 DIAGNOSIS — Z7902 Long term (current) use of antithrombotics/antiplatelets: Secondary | ICD-10-CM | POA: Insufficient documentation

## 2021-03-03 DIAGNOSIS — J45909 Unspecified asthma, uncomplicated: Secondary | ICD-10-CM | POA: Insufficient documentation

## 2021-03-03 DIAGNOSIS — Z7984 Long term (current) use of oral hypoglycemic drugs: Secondary | ICD-10-CM | POA: Insufficient documentation

## 2021-03-03 DIAGNOSIS — R1031 Right lower quadrant pain: Secondary | ICD-10-CM | POA: Diagnosis not present

## 2021-03-03 DIAGNOSIS — Z87891 Personal history of nicotine dependence: Secondary | ICD-10-CM | POA: Diagnosis not present

## 2021-03-03 DIAGNOSIS — Z20822 Contact with and (suspected) exposure to covid-19: Secondary | ICD-10-CM | POA: Insufficient documentation

## 2021-03-03 DIAGNOSIS — Z794 Long term (current) use of insulin: Secondary | ICD-10-CM | POA: Insufficient documentation

## 2021-03-03 DIAGNOSIS — Z7951 Long term (current) use of inhaled steroids: Secondary | ICD-10-CM | POA: Insufficient documentation

## 2021-03-03 LAB — I-STAT VENOUS BLOOD GAS, ED
Acid-Base Excess: 0 mmol/L (ref 0.0–2.0)
Bicarbonate: 27.1 mmol/L (ref 20.0–28.0)
Calcium, Ion: 1.22 mmol/L (ref 1.15–1.40)
HCT: 37 % — ABNORMAL LOW (ref 39.0–52.0)
Hemoglobin: 12.6 g/dL — ABNORMAL LOW (ref 13.0–17.0)
O2 Saturation: 80 %
Potassium: 4.2 mmol/L (ref 3.5–5.1)
Sodium: 139 mmol/L (ref 135–145)
TCO2: 29 mmol/L (ref 22–32)
pCO2, Ven: 55.1 mmHg (ref 44.0–60.0)
pH, Ven: 7.299 (ref 7.250–7.430)
pO2, Ven: 50 mmHg — ABNORMAL HIGH (ref 32.0–45.0)

## 2021-03-03 LAB — CBC WITH DIFFERENTIAL/PLATELET
Abs Immature Granulocytes: 0.04 10*3/uL (ref 0.00–0.07)
Basophils Absolute: 0 10*3/uL (ref 0.0–0.1)
Basophils Relative: 0 %
Eosinophils Absolute: 0 10*3/uL (ref 0.0–0.5)
Eosinophils Relative: 1 %
HCT: 38.9 % — ABNORMAL LOW (ref 39.0–52.0)
Hemoglobin: 12.4 g/dL — ABNORMAL LOW (ref 13.0–17.0)
Immature Granulocytes: 1 %
Lymphocytes Relative: 16 %
Lymphs Abs: 1.1 10*3/uL (ref 0.7–4.0)
MCH: 27.6 pg (ref 26.0–34.0)
MCHC: 31.9 g/dL (ref 30.0–36.0)
MCV: 86.6 fL (ref 80.0–100.0)
Monocytes Absolute: 0.5 10*3/uL (ref 0.1–1.0)
Monocytes Relative: 7 %
Neutro Abs: 5.4 10*3/uL (ref 1.7–7.7)
Neutrophils Relative %: 75 %
Platelets: 126 10*3/uL — ABNORMAL LOW (ref 150–400)
RBC: 4.49 MIL/uL (ref 4.22–5.81)
RDW: 14.8 % (ref 11.5–15.5)
WBC: 7.1 10*3/uL (ref 4.0–10.5)
nRBC: 0 % (ref 0.0–0.2)

## 2021-03-03 LAB — COMPREHENSIVE METABOLIC PANEL
ALT: 44 U/L (ref 0–44)
AST: 29 U/L (ref 15–41)
Albumin: 3.5 g/dL (ref 3.5–5.0)
Alkaline Phosphatase: 50 U/L (ref 38–126)
Anion gap: 5 (ref 5–15)
BUN: 16 mg/dL (ref 6–20)
CO2: 27 mmol/L (ref 22–32)
Calcium: 9 mg/dL (ref 8.9–10.3)
Chloride: 105 mmol/L (ref 98–111)
Creatinine, Ser: 1.33 mg/dL — ABNORMAL HIGH (ref 0.61–1.24)
GFR, Estimated: >60 mL/min (ref >60)
Glucose, Bld: 237 mg/dL — ABNORMAL HIGH (ref 70–99)
Potassium: 4.4 mmol/L (ref 3.5–5.1)
Sodium: 137 mmol/L (ref 135–145)
Total Bilirubin: 0.5 mg/dL (ref 0.3–1.2)
Total Protein: 6 g/dL — ABNORMAL LOW (ref 6.5–8.1)

## 2021-03-03 LAB — URINALYSIS, ROUTINE W REFLEX MICROSCOPIC
Bacteria, UA: NONE SEEN
Bilirubin Urine: NEGATIVE
Glucose, UA: >=500 mg/dL — AB
Hgb urine dipstick: NEGATIVE
Ketones, ur: NEGATIVE mg/dL
Leukocytes,Ua: NEGATIVE
Nitrite: NEGATIVE
Protein, ur: NEGATIVE mg/dL
Specific Gravity, Urine: 1.02 (ref 1.005–1.030)
pH: 5 (ref 5.0–8.0)

## 2021-03-03 LAB — CBG MONITORING, ED: Glucose-Capillary: 199 mg/dL — ABNORMAL HIGH (ref 70–99)

## 2021-03-03 LAB — RESP PANEL BY RT-PCR (FLU A&B, COVID) ARPGX2
Influenza A by PCR: NEGATIVE
Influenza B by PCR: NEGATIVE
SARS Coronavirus 2 by RT PCR: NEGATIVE

## 2021-03-03 LAB — LACTIC ACID, PLASMA
Lactic Acid, Venous: 2.7 mmol/L (ref 0.5–1.9)
Lactic Acid, Venous: 1.8 mmol/L (ref 0.5–1.9)

## 2021-03-03 LAB — LIPASE, BLOOD: Lipase: 28 U/L (ref 11–51)

## 2021-03-03 MED ORDER — IOHEXOL 350 MG/ML SOLN
100.0000 mL | Freq: Once | INTRAVENOUS | Status: AC | PRN
  Administered 2021-03-03: 100 mL via INTRAVENOUS

## 2021-03-03 MED ORDER — LACTATED RINGERS IV BOLUS
1000.0000 mL | Freq: Once | INTRAVENOUS | Status: AC
  Administered 2021-03-03: 1000 mL via INTRAVENOUS

## 2021-03-03 MED ORDER — ONDANSETRON HCL 4 MG/2ML IJ SOLN
4.0000 mg | Freq: Once | INTRAMUSCULAR | Status: AC
  Administered 2021-03-03: 4 mg via INTRAVENOUS
  Filled 2021-03-03: qty 2

## 2021-03-03 MED ORDER — LACTATED RINGERS IV BOLUS: 1000.0000 mL | Freq: Once | INTRAVENOUS | Status: DC

## 2021-03-03 NOTE — ED Provider Notes (Signed)
[name] EMERGENCY DEPARTMENT Provider Note   CSN: [id number] Arrival date & time: 03/03/21  1547     History No chief complaint on file.   Nicholas [name] is a 60 y.o. male.  Patient is a 60 year old male with a history of non-epileptic seizures, bipolar disorder, CKD, diabetes, hypertension, asthma who presents with abdominal pain.  Patient originally called EMS out to the house for right lower quadrant abdominal pain.  Pain has been going on for about 3 days.  He reports 10 out of 10 sharp.  He had nausea and vomiting starting yesterday.  When EMS arrived to the house, they report that patient was able to explain what was bothering him.  He then went unresponsive with agonal respirations.  He was bagged by EMS with BVM in route, however patient became completely oriented.  EMS reports 1 additional episode of unresponsiveness for about 2 minutes when he had some strange eye movements but then came back completely to consciousness.  EMS administered 1 mg intranasal Narcan without response after patient's first episode of unresponsiveness.  Only medications EMS gave in addition to the Narcan was 300 mL of normal saline.  On my evaluation, patient is alert awake and oriented.  He reports right lower quadrant abdominal pain.  He denies any diarrhea or constipation.  Denies any changes in urination.  Denies hematuria.  Denies fevers or chills.  Does have a history of abdominal surgery.  States that he has a history of seizures however does not take Keppra anymore.  Patient denies any opioid use today, however patient was found with oxycodone pill bottle close to where he was lying by EMS.    Past Medical History:  Diagnosis Date  . Asthma   . Bipolar 1 disorder (HCC)   . Borderline glaucoma   . Chronic pain   . Epididymal pain    LEFT  . 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  Feeling of incomplete bladder emptying   . Frequency of urination   . Gastric ulcer   . GERD (gastroesophageal reflux disease)   . Hypertension   . Hyperthyroidism    NO MEDS   . Migraine   . Seizures (HCC)   . TIA (transient ischemic attack)   . Type 2 diabetes mellitus [facility])     Patient Active Problem List   Diagnosis Date Noted  . Seizures (HCC) 12/03/2020  . Rectal bleeding 10/31/2020  . AKI (acute kidney injury) (HCC) 10/31/2020  . Cardiomyopathy (HCC) 09/23/2020  . Atypical chest pain 09/23/2020  . Precordial pain   . CVA (cerebral vascular accident) (HCC) 09/02/2020  . Altered mental status 07/23/2020  . Seizure (HCC) 07/22/2020  . Left-sided weakness 07/04/2020  . Dysarthria 07/04/2020  . Essential hypertension   . Seizure disorder (HCC) 07/02/2017  . Insulin-requiring or dependent type II diabetes mellitus (HCC) 07/02/2017  . CKD (chronic kidney disease), stage III (HCC) 07/02/2017  . Normocytic anemia 07/02/2017  . Testicular/scrotal pain 11/09/2013  . Microhematuria 11/09/2013  . Condyloma acuminatum of scrotum 11/09/2013    Past Surgical History:  Procedure Laterality Date  . ABDOMINAL SURGERY    . ANTERIOR CERVICAL DECOMP/DISCECTOMY FUSION  2007   C4  --  C6  . CERVICAL FUSION    . CHOLECYSTECTOMY    . COLONOSCOPY WITH PROPOFOL Left 11/02/2020   Procedure: COLONOSCOPY WITH  PROPOFOL;  Surgeon: Willis Modena, MD;  Location: [facility] ENDOSCOPY;  Service: Endoscopy;  Laterality: Left;  . CYSTOSCOPY N/A 11/09/2013   Procedure: CYSTOSCOPY FLEXIBLE;  Surgeon: Bjorn Pippin, MD;  Location: [facility];  Service: Urology;  Laterality: N/A;  . EPIDIDYMECTOMY Left 11/09/2013   Procedure:  LEFT EPIDIDYMECTOMY;  Surgeon: Bjorn Pippin, MD;  Location: [facility];  Service: Urology;  Laterality: Left;  POSSIBLE OUTPATIENT WITH OBSERVATION  . EXCISION LIPOMA LEFT SHOULDER  2004 (APPROX)  . MULTIPLE CYST REMOVED FROM CHEST  AGE 30  . OTHER SURGICAL  HISTORY     hemorroid surgery   . TESTICLE REMOVAL Left   . TONSILLECTOMY         Family History  Problem Relation Age of Onset  . Heart failure Mother   . Diabetes Mother   . Hypertension Mother   . Cirrhosis Father   . Heart failure Father   . Kidney disease Father     Social History   Tobacco Use  . Smoking status: Former Smoker    Packs/day: 0.25    Years: 15.00    Pack years: 3.75    Types: Cigarettes    Quit date: 11/08/1992    Years since quitting: 28.3  . Smokeless tobacco: Never Used  Vaping Use  . Vaping Use: Never used  Substance Use Topics  . Alcohol use: No  . Drug use: No    Home Medications Prior to Admission medications   Medication Sig Start Date End Date Taking? Authorizing Provider  albuterol (PROVENTIL HFA;VENTOLIN HFA) 108 (90 Base) MCG/ACT inhaler Inhale 1-2 puffs into the lungs every 6 (six) hours as needed for wheezing or shortness of breath. 11/15/18   Rancour, Jeannett Senior, MD  atorvastatin (LIPITOR) 80 MG tablet Take 1 tablet (80 mg total) by mouth at bedtime. Patient taking differently: Take 80 mg by mouth daily. 09/05/20   Danford, Earl Lites, MD  budesonide-formoterol (SYMBICORT) 80-4.5 MCG/ACT inhaler Inhale 2 puffs into the lungs daily.    [provider]  clopidogrel (PLAVIX) 75 MG tablet Take 1 tablet (75 mg total) by mouth daily. 07/05/20   Azucena Fallen, MD  doxycycline (VIBRA-TABS) 100 MG tablet Take 1 tablet (100 mg total) by mouth 2 (two) times daily. 02/07/21   Regal, Kirstie Peri, DPM  FARXIGA 10 MG TABS tablet Take 10 mg by mouth daily. 11/05/20   [provider]  fenofibrate (TRICOR) 145 MG tablet Take 1 tablet (145 mg total) by mouth daily. 11/02/20   Swayze, Ava, DO  gabapentin (NEURONTIN) 400 MG capsule Take 2 capsules (800 mg total) by mouth at bedtime. 12/06/20   Swayze, Ava, DO  gabapentin (NEURONTIN) 600 MG tablet Take 1 tablet (600 mg total) by mouth daily at 6 (six) AM. 12/06/20   Swayze, Ava, DO   HUMALOG KWIKPEN 100 UNIT/ML KwikPen Inject 0-14 Units into the skin 4 (four) times daily as needed (high blood sugar). Inject into the skin up to 4 times a day, per sliding scale: BGL 160 or greater = 10 units; 200 or greater = 14 units 11/05/20   [provider]  latanoprost (XALATAN) 0.005 % ophthalmic solution Place 1 drop into both eyes at bedtime.  07/04/20   [provider]  levETIRAcetam (KEPPRA) 250 MG tablet Take 250 mg by mouth in the morning and at bedtime.    [provider]  metFORMIN (GLUCOPHAGE) 1000 MG tablet Take 500 mg by mouth 2 (two) times daily. 11/04/20   [provider]  metoCLOPramide (REGLAN) 10 MG tablet Take 10 mg by mouth 4 (four) times daily.    [provider]  nortriptyline (PAMELOR) 50 MG capsule Take 100 mg by mouth at bedtime.     [provider]  oxyCODONE-acetaminophen (PERCOCET) 10-325 MG tablet Take 1.5 tablets by mouth every 6 (six) hours as needed for pain. 10/24/20   [provider]  oxyCODONE-acetaminophen (PERCOCET) 10-325 MG tablet Take 1 tablet by mouth every 4 (four) hours as needed for pain. 02/07/21   Lenn Sink, DPM  oxyCODONE-acetaminophen (PERCOCET) 5-325 MG tablet Take 1-2 tablets by mouth every 4 (four) hours as needed for severe pain. 01/24/21   Candelaria Stagers, DPM  pantoprazole (PROTONIX) 40 MG tablet Take 40 mg by mouth daily. 07/03/20   [provider]  polyethylene glycol powder (GLYCOLAX/MIRALAX) 17 GM/SCOOP powder Take 17 g by mouth daily as needed for mild constipation. 12/31/15   [provider]  sertraline (ZOLOFT) 50 MG tablet Take 50 mg by mouth daily. 06/13/20   [provider]  SUMAtriptan (IMITREX) 100 MG tablet Take 1 tablet (100 mg total) by mouth daily as needed for migraine or headache. May repeat in 2 hours if headache persists or recurs. 07/25/20   Leatha Gilding, MD  tamsulosin (FLOMAX) 0.4 MG CAPS capsule Take 0.4 mg by mouth in the  morning and at bedtime.    [provider]  timolol (TIMOPTIC) 0.5 % ophthalmic solution Place 1 drop into both eyes 2 (two) times daily.  04/06/20   [provider]  zonisamide (ZONEGRAN) 50 MG capsule Take 100 mg by mouth at bedtime.    [provider]    Allergies    Finasteride, Levothyroxine, Nsaids, Amoxicillin, Ampicillin, Penicillins, Strawberry extract, Asa [aspirin], Bactrim [sulfamethoxazole-trimethoprim], Dapagliflozin, Depakote [divalproex sodium], Dilantin [phenytoin], Methocarbamol, Other, Risperidone and related, Sitagliptin, Strawberry (diagnostic), Sulfa antibiotics, Tolmetin, Ultram [tramadol hcl], Buprenorphine, and Oatmeal  Review of Systems   Review of Systems  Constitutional: Negative for chills and fever.  HENT: Negative for ear pain and sore throat.   Eyes: Negative for pain and visual disturbance.  Respiratory: Negative for cough and shortness of breath.   Cardiovascular: Negative for chest pain and palpitations.  Gastrointestinal: Positive for abdominal pain, nausea and vomiting. Negative for constipation and diarrhea.  Genitourinary: Negative for dysuria and hematuria.  Musculoskeletal: Negative for back pain and neck pain.  Skin: Negative for color change and rash.  Neurological: Negative for facial asymmetry and speech difficulty.  All other systems reviewed and are negative.   Physical Exam Updated Vital Signs BP 121/81 (BP Location: Left Arm)   Pulse 81   Temp 98.5 F (36.9 C)   Resp 18   SpO2 93%   Physical Exam Vitals and nursing note reviewed.  Constitutional:      General: He is not in acute distress.    Appearance: He is well-developed.  HENT:     Head: Normocephalic and atraumatic.     Right Ear: External ear normal.     Left Ear: External ear normal.     Mouth/Throat:     Mouth: Mucous membranes are moist.     Pharynx: Oropharynx is clear.  Eyes:     Extraocular Movements: Extraocular movements intact.      Comments: Pupils pinpoint, reactive to light bilaterally  Cardiovascular:     Rate and Rhythm: Normal rate and regular rhythm.  Pulmonary:     Effort: Pulmonary effort is normal.  Breath sounds: Normal breath sounds.  Abdominal:     General: There is distension.     Palpations: Abdomen is soft.     Tenderness: There is abdominal tenderness.     Comments: Right lower quadrant tenderness, distention noted.  Genitourinary:    Penis: Normal.      Testes: Normal.  Musculoskeletal:     Cervical back: Neck supple.  Skin:    General: Skin is warm and dry.  Neurological:     Mental Status: He is alert.  Psychiatric:        Mood and Affect: Mood normal.        Behavior: Behavior normal.     ED Results / Procedures / Treatments   Labs (all labs ordered are listed, but only abnormal results are displayed) Labs Reviewed  COMPREHENSIVE METABOLIC PANEL - Abnormal; Notable for the following components:      Result Value   Glucose, Bld 237 (*)    Creatinine, Ser 1.33 (*)    Total Protein 6.0 (*)    All other components within normal limits  CBC WITH DIFFERENTIAL/PLATELET - Abnormal; Notable for the following components:   Hemoglobin 12.4 (*)    HCT 38.9 (*)    Platelets 126 (*)    All other components within normal limits  URINALYSIS, ROUTINE W REFLEX MICROSCOPIC - Abnormal; Notable for the following components:   Glucose, UA >=500 (*)    All other components within normal limits  LACTIC ACID, PLASMA - Abnormal; Notable for the following components:   Lactic Acid, Venous 2.7 (*)    All other components within normal limits  I-STAT VENOUS BLOOD GAS, ED - Abnormal; Notable for the following components:   pO2, Ven 50.0 (*)    HCT 37.0 (*)    Hemoglobin 12.6 (*)    All other components within normal limits  CBG MONITORING, ED - Abnormal; Notable for the following components:   Glucose-Capillary 199 (*)    All other components within normal limits  RESP PANEL BY RT-PCR (FLU A&B,  COVID) ARPGX2  LIPASE, BLOOD  LACTIC ACID, PLASMA    EKG EKG Interpretation  Date/Time:  Monday March 03 2021 21:34:48 EDT Ventricular Rate:  74 PR Interval:  160 QRS Duration: 82 QT Interval:  364 QTC Calculation: 404 R Axis:   -21 Text Interpretation: Normal sinus rhythm Nonspecific T wave abnormality Abnormal ECG Confirmed by Blane Ohara [phone]) on 03/03/2021 10:04:52 PM   Radiology CT ABDOMEN PELVIS W CONTRAST  Result Date: 03/03/2021 CLINICAL DATA:  Right lower quadrant abdominal pain. EXAM: CT ABDOMEN AND PELVIS WITH CONTRAST TECHNIQUE: Multidetector CT imaging of the abdomen and pelvis was performed using the standard protocol following bolus administration of intravenous contrast. CONTRAST:  OMNIPAQUE IOHEXOL 350 MG/ML SOLN COMPARISON:  Most recent CT 05/04/2017 FINDINGS: Lower chest: Patchy bibasilar opacities typical of atelectasis. No significant pleural effusion. Hepatobiliary: The right dome of the liver is not included in the field of view. There is diffuse hepatic steatosis. No evidence of focal liver abnormality. Cholecystectomy without biliary dilatation. Pancreas: No ductal dilatation or inflammation. Spleen: Normal in size without focal abnormality. Splenule anteriorly. Adrenals/Urinary Tract: No adrenal nodule. No hydronephrosis or visualized renal stone. Homogeneous renal enhancement. Symmetric excretion on delayed phase imaging. No focal renal abnormality. Urinary bladder is distended to the level of the umbilicus. No bladder wall thickening Stomach/Bowel: Air-filled appendix courses in the right pericolic gutter, series 3 image 34. No appendicitis. Postsurgical change of the distal stomach with gastrojejunostomy. Decompressed  duodenum. Patulous jejunal bowel loops in the left mid abdomen. No obstruction, inflammation or wall thickening. Terminal ileum is not well-defined, but is noninflamed. Moderate volume of colonic stool. Sigmoid colon is redundant. Mild  descending diverticulosis without diverticulitis. No colonic wall thickening. Vascular/Lymphatic: No acute vascular findings. Normal caliber abdominal aorta. Patent portal vein. No abdominopelvic adenopathy. Reproductive: Prostate gland unremarkable. Other: Postsurgical change of the upper abdominal wall. No ascites or free air. No focal fluid collection or abscess. No inflammatory change in the abdomen Musculoskeletal: There are no acute or suspicious osseous abnormalities. Incidental note of transitional lumbosacral anatomy with 4 non-rib-bearing lumbar vertebra. IMPRESSION: 1. No acute abnormality or explanation for right lower quadrant pain. Normal appendix. 2. Hepatic steatosis. 3. Mild descending colonic diverticulosis without diverticulitis. 4. Distended urinary bladder to the level of the umbilicus. No bladder wall thickening. Electronically Signed   By: Narda RutherfordMelanie  Sanford M.D.   On: 03/03/2021 18:33    Procedures Procedures   Medications Ordered in ED Medications  lactated ringers bolus 1,000 mL (has no administration in time range)  lactated ringers bolus 1,000 mL (0 mLs Intravenous Stopped 03/03/21 1831)  ondansetron (ZOFRAN) injection 4 mg (4 mg Intravenous Given 03/03/21 1614)  iohexol (OMNIPAQUE) 350 MG/ML injection 100 mL (100 mLs Intravenous Contrast Given 03/03/21 1803)    ED Course  I have reviewed the triage vital signs and the nursing notes.  Pertinent labs & imaging results that were available during my care of the patient were reviewed by me and considered in my medical decision making (see chart for details).    MDM Rules/Calculators/A&P                          Patient is a 60 year old male who presents with right lower quadrant abdominal pain of 3 days duration.  Patient called EMS out of the house for the symptoms.  When EMS arrived patient was alert awake and talking to them when he went unresponsive for a period of 5 minutes.  Patient regained consciousness and then will  sit again for about 2 minutes while in route to the hospital.  On arrival, patient is hemodynamically stable.  He is alert awake and oriented.  Blood pressure 106/72, afebrile, heart rate fast but appropriate in 93 saturating 94% on room air.  Patient states he has a history of seizures however is no longer taking Keppra.  Per further chart review patient was admitted back in December for encephalopathy and was treatment at that time to have nonepileptic seizures thus his Keppra was discontinued.  Additionally, patient denies use of opioids or other pain medication today.  EMS did find a bottle of oxycodone lying next to patient on their arrival to his motel.  Patient is currently living at a motel by himself and therefore additional information cannot be obtained by anyone but the patient.  On my exam, he is significantly tender in the right lower quadrant of the abdomen.  I have checked his testicular area without any tenderness or abnormalities.  It is extremely tender in the right lower quadrant.  Patient does still have his appendix.  Patient is a large abdominal scar across the top of his abdomen as he does have a history of abdominal surgery.  Denies changes in his bowel habits, however does report nausea and vomiting over the past day.  Patient with bilateral pinpoint pupils concerning for opioid intoxication.  Unresponsiveness unclear if secondary to nonepileptic seizure versus opioid intoxication.  We will continue to monitor mental status, as he is appropriate at this time.  Lab work-up initiated including CT abdomen and pelvis, abdominal labs, lactic acid, Covid test, glucose, and EKG ordered.  Notable findings include a normal lipase of 28.  Hemoglobin 12.4, 12.9 just 2 months ago.  Covid negative.  Lactic acid 2.7.  I-STAT venous blood gas with normal pH and normal PCO2. No leukocytosis.   EKG reviewed. No signs of ischemia. QRS appropriate. No prolong QTc, PR interval normal. T wave  flattening of the lateral leads noted in previous EKGs. Nothing on EKG concerning for arrhythmia or ischemia.  CT abdomen and pelvis notable for no acute intraabdominal abnormality except distended bladder. Patient subsequently urinated nearly 1L of urine here. UA sent without evidence for infection or blood.   Patient continued to have appropriate mental status throughout his time in the ED. He did not have another episode of loss of consciousness. He was observed for a duration of greater than 6 hours.  No acute abdominal process identified. Patient's pain likely 2/2 to distended and overfilled bladder than was relieved after urination while in ED. Lactic acid on arrival improved after fluid, likely patient had some dehydration. Unclear if unresponsive episodes were 2/2 to non-epileptic seizure activity. No further episodes here. Possible patient was acutely intoxicated with opioid medication on arrival, however, at my final re-evaluation he was awake, alert, pupils returned to baseline and watching tv on his phone. Comfortable. Hemodynamically stable. Encouraged close follow up with PCP. Strict return precautions provided.  Stable for discharge.   Final Clinical Impression(s) / ED Diagnoses Final diagnoses:  Right lower quadrant abdominal pain    Rx / DC Orders ED Discharge Orders    None       Wrigley Plasencia, [place], MD 03/04/21 9147    Blane Ohara, MD 03/08/21 1148

## 2021-03-03 NOTE — ED Notes (Signed)
Pt verbalizes understanding of discharge instructions. Opportunity for questions and answers were provided. Pt discharged from the ED.   ?

## 2021-03-03 NOTE — ED Notes (Signed)
Pt. HR 88 NSR

## 2021-03-03 NOTE — ED Notes (Signed)
Patient transported to CT 

## 2021-03-03 NOTE — ED Triage Notes (Addendum)
Pt arrived via [facility] from a motel [place] he is living c/o 10/10 sharp pain in RLQ of abdomenx3 days. N/V started yesterday. Per EMS, pt was laying in bed when they arrived and was able to say why he called then went unresponsive with aganol respirations. EMS bagged pt with BVM then pt came to and was completely oriented. Pt was talking to EMS in ambulance after strange eye movements then went unresponsive for 2 mins. EMS gave narcan 1mg  intranasal, but pt did not respond until 5 mins later. When pt became responsive he was completely oriented then was lethargic the rest of the ride to hosp. EMS also gave NS 300cc. Pt is arousable by voice and orientedx4.. VSS. PT is NSR on monitor.

## 2021-03-03 NOTE — Discharge Instructions (Signed)
-   Please make a follow up appointment with your primary doctor - You will need to continue to follow up with neurology as regularly scheduled - Your CT abdomen and pelvis was normal today. There was no evidence of urinary tract infection.

## 2021-03-12 ENCOUNTER — Emergency Department (HOSPITAL_COMMUNITY): Payer: Medicare (Managed Care)

## 2021-03-12 ENCOUNTER — Emergency Department (HOSPITAL_COMMUNITY)
Admission: EM | Admit: 2021-03-12 | Discharge: 2021-03-13 | Disposition: A | Discharge status: Home / Self Care | Payer: Medicare (Managed Care) | Attending: Emergency Medicine | Admitting: Emergency Medicine

## 2021-03-12 ENCOUNTER — Other Ambulatory Visit: Payer: Self-pay

## 2021-03-12 ENCOUNTER — Encounter (HOSPITAL_COMMUNITY): Payer: Self-pay

## 2021-03-12 DIAGNOSIS — J45909 Unspecified asthma, uncomplicated: Secondary | ICD-10-CM | POA: Diagnosis not present

## 2021-03-12 DIAGNOSIS — R4182 Altered mental status, unspecified: Secondary | ICD-10-CM | POA: Insufficient documentation

## 2021-03-12 DIAGNOSIS — R569 Unspecified convulsions: Secondary | ICD-10-CM | POA: Diagnosis not present

## 2021-03-12 DIAGNOSIS — Z87891 Personal history of nicotine dependence: Secondary | ICD-10-CM | POA: Insufficient documentation

## 2021-03-12 DIAGNOSIS — Z794 Long term (current) use of insulin: Secondary | ICD-10-CM | POA: Insufficient documentation

## 2021-03-12 DIAGNOSIS — Z20822 Contact with and (suspected) exposure to covid-19: Secondary | ICD-10-CM | POA: Insufficient documentation

## 2021-03-12 DIAGNOSIS — N183 Chronic kidney disease, stage 3 unspecified: Secondary | ICD-10-CM | POA: Diagnosis not present

## 2021-03-12 DIAGNOSIS — E1122 Type 2 diabetes mellitus with diabetic chronic kidney disease: Secondary | ICD-10-CM | POA: Diagnosis not present

## 2021-03-12 DIAGNOSIS — I129 Hypertensive chronic kidney disease with stage 1 through stage 4 chronic kidney disease, or unspecified chronic kidney disease: Secondary | ICD-10-CM | POA: Diagnosis not present

## 2021-03-12 DIAGNOSIS — Z7984 Long term (current) use of oral hypoglycemic drugs: Secondary | ICD-10-CM | POA: Insufficient documentation

## 2021-03-12 DIAGNOSIS — Z7902 Long term (current) use of antithrombotics/antiplatelets: Secondary | ICD-10-CM | POA: Insufficient documentation

## 2021-03-12 LAB — URINALYSIS, ROUTINE W REFLEX MICROSCOPIC
Bacteria, UA: NONE SEEN
Bilirubin Urine: NEGATIVE
Glucose, UA: >=500 mg/dL — AB
Hgb urine dipstick: NEGATIVE
Ketones, ur: NEGATIVE mg/dL
Leukocytes,Ua: NEGATIVE
Nitrite: NEGATIVE
Protein, ur: NEGATIVE mg/dL
Specific Gravity, Urine: 1.031 — ABNORMAL HIGH (ref 1.005–1.030)
pH: 5 (ref 5.0–8.0)

## 2021-03-12 LAB — BASIC METABOLIC PANEL
Anion gap: 9 (ref 5–15)
BUN: 21 mg/dL — ABNORMAL HIGH (ref 6–20)
CO2: 23 mmol/L (ref 22–32)
Calcium: 9.3 mg/dL (ref 8.9–10.3)
Chloride: 111 mmol/L (ref 98–111)
Creatinine, Ser: 1.34 mg/dL — ABNORMAL HIGH (ref 0.61–1.24)
GFR, Estimated: >60 mL/min (ref >60)
Glucose, Bld: 119 mg/dL — ABNORMAL HIGH (ref 70–99)
Potassium: 3.8 mmol/L (ref 3.5–5.1)
Sodium: 143 mmol/L (ref 135–145)

## 2021-03-12 LAB — CBG MONITORING, ED
Glucose-Capillary: 130 mg/dL — ABNORMAL HIGH (ref 70–99)
Glucose-Capillary: 117 mg/dL — ABNORMAL HIGH (ref 70–99)

## 2021-03-12 LAB — HEPATIC FUNCTION PANEL
ALT: 38 U/L (ref 0–44)
AST: 34 U/L (ref 15–41)
Albumin: 4.1 g/dL (ref 3.5–5.0)
Alkaline Phosphatase: 49 U/L (ref 38–126)
Bilirubin, Direct: <0.1 mg/dL (ref 0.0–0.2)
Total Bilirubin: 0.1 mg/dL — ABNORMAL LOW (ref 0.3–1.2)
Total Protein: 6.9 g/dL (ref 6.5–8.1)

## 2021-03-12 LAB — BLOOD GAS, ARTERIAL
Acid-base deficit: 2.3 mmol/L — ABNORMAL HIGH (ref 0.0–2.0)
Bicarbonate: 23.1 mmol/L (ref 20.0–28.0)
Drawn by: 29503
O2 Saturation: 92.7 %
Patient temperature: 98.6
pCO2 arterial: 44.7 mmHg (ref 32.0–48.0)
pH, Arterial: 7.333 — ABNORMAL LOW (ref 7.350–7.450)
pO2, Arterial: 73.5 mmHg — ABNORMAL LOW (ref 83.0–108.0)

## 2021-03-12 LAB — RAPID URINE DRUG SCREEN, HOSP PERFORMED
Amphetamines: NOT DETECTED
Barbiturates: NOT DETECTED
Benzodiazepines: POSITIVE — AB
Cocaine: NOT DETECTED
Opiates: NOT DETECTED
Tetrahydrocannabinol: NOT DETECTED

## 2021-03-12 LAB — CBC
HCT: 39.9 % (ref 39.0–52.0)
Hemoglobin: 12.6 g/dL — ABNORMAL LOW (ref 13.0–17.0)
MCH: 27.6 pg (ref 26.0–34.0)
MCHC: 31.6 g/dL (ref 30.0–36.0)
MCV: 87.5 fL (ref 80.0–100.0)
Platelets: 204 10*3/uL (ref 150–400)
RBC: 4.56 MIL/uL (ref 4.22–5.81)
RDW: 15.2 % (ref 11.5–15.5)
WBC: 6.4 10*3/uL (ref 4.0–10.5)
nRBC: 0 % (ref 0.0–0.2)

## 2021-03-12 LAB — RESP PANEL BY RT-PCR (FLU A&B, COVID) ARPGX2
Influenza A by PCR: NEGATIVE
Influenza B by PCR: NEGATIVE
SARS Coronavirus 2 by RT PCR: NEGATIVE

## 2021-03-12 LAB — ETHANOL: Alcohol, Ethyl (B): <10 mg/dL (ref <10)

## 2021-03-12 MED ORDER — LORAZEPAM 2 MG/ML IJ SOLN
INTRAMUSCULAR | Status: AC
  Filled 2021-03-12: qty 1

## 2021-03-12 MED ORDER — LORAZEPAM 2 MG/ML IJ SOLN: 1.0000 mg | Freq: Once | INTRAMUSCULAR | Status: DC

## 2021-03-12 MED ORDER — SODIUM CHLORIDE 0.9 % IV SOLN
2000.0000 mg | Freq: Once | INTRAVENOUS | Status: AC
  Administered 2021-03-12: 2000 mg via INTRAVENOUS
  Filled 2021-03-12: qty 20

## 2021-03-12 MED ORDER — NALOXONE HCL 2 MG/2ML IJ SOSY
1.0000 mg | PREFILLED_SYRINGE | Freq: Once | INTRAMUSCULAR | Status: AC
  Administered 2021-03-12: 1 mg via INTRAVENOUS
  Filled 2021-03-12: qty 2

## 2021-03-12 MED ORDER — IOHEXOL 350 MG/ML SOLN
100.0000 mL | Freq: Once | INTRAVENOUS | Status: AC | PRN
  Administered 2021-03-12: 100 mL via INTRAVENOUS

## 2021-03-12 MED ORDER — ONDANSETRON HCL 4 MG/2ML IJ SOLN
4.0000 mg | Freq: Once | INTRAMUSCULAR | Status: AC
  Administered 2021-03-12: 4 mg via INTRAVENOUS
  Filled 2021-03-12: qty 2

## 2021-03-12 NOTE — ED Notes (Signed)
Teleneuro cart at bedside

## 2021-03-12 NOTE — ED Triage Notes (Signed)
Patient was in [place] lobby, had 3 seizures prior to EMS arrival, 2 with ems, given 2.5mg  versed, patient obtunded with snoring at this time

## 2021-03-12 NOTE — ED Notes (Signed)
Blood draw from left AC to obtain labs

## 2021-03-12 NOTE — ED Notes (Signed)
Patient transported to CT 

## 2021-03-12 NOTE — ED Provider Notes (Signed)
[facility]-EMERGENCY DEPT Provider Note   CSN: [id number] Arrival date & time: 03/12/21  1557      Level 5 caveat due to altered mental status.  History Chief Complaint  Patient presents with  . Seizures    Nicholas [name] is a 60 y.o. male.  HPI    Level 5 caveat due to altered mental status  Nicholas [name] is a 60 y.o. male, with a history of epilepsy, bipolar, asthma, HTN, DM type II, TIA, CVA, CKD stage III, presenting to the ED with reported seizures.  Per EMS report, EMS called to a local hotel for patient having a seizure.  Reportedly had 3 seizures prior to EMS arrival.  2 seizures with EMS.  Received 2.5 mg Versed with EMS.  No further seizure activity was noted during transport.  Patient continued to remain nonresponsive.     Past Medical History:  Diagnosis Date  . Asthma   . Bipolar 1 disorder (HCC)   . Borderline glaucoma   . Chronic pain   . Epididymal pain    LEFT  . Epilepsy, 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35---  LAST SEIZURE 1 WK AGO (APPROX ,  10-31-2013)   NO NEUROLOGIST---  PT SEES PCP  Nicholas [name]  . Feeling of incomplete bladder emptying   . Frequency of urination   . Gastric ulcer   . GERD (gastroesophageal reflux disease)   . Hypertension   . Hyperthyroidism    NO MEDS   . Migraine   . Seizures (HCC)   . TIA (transient ischemic attack)   . Type 2 diabetes mellitus [facility])     Patient Active Problem List   Diagnosis Date Noted  . Seizures (HCC) 12/03/2020  . Rectal bleeding 10/31/2020  . AKI (acute kidney injury) (HCC) 10/31/2020  . Cardiomyopathy (HCC) 09/23/2020  . Atypical chest pain 09/23/2020  . Precordial pain   . CVA (cerebral vascular accident) (HCC) 09/02/2020  . Altered mental status 07/23/2020  . Seizure (HCC) 07/22/2020  . Left-sided weakness 07/04/2020  . Dysarthria 07/04/2020  . Essential hypertension   . Seizure disorder (HCC) 07/02/2017  . Insulin-requiring or dependent type II diabetes mellitus (HCC)  07/02/2017  . CKD (chronic kidney disease), stage III (HCC) 07/02/2017  . Normocytic anemia 07/02/2017  . Testicular/scrotal pain 11/09/2013  . Microhematuria 11/09/2013  . Condyloma acuminatum of scrotum 11/09/2013    Past Surgical History:  Procedure Laterality Date  . ABDOMINAL SURGERY    . ANTERIOR CERVICAL DECOMP/DISCECTOMY FUSION  2007   C4  --  C6  . CERVICAL FUSION    . CHOLECYSTECTOMY    . COLONOSCOPY WITH PROPOFOL Left 11/02/2020   Procedure: COLONOSCOPY WITH PROPOFOL;  Surgeon: Nicholas Modena, MD;  Location: [facility] ENDOSCOPY;  Service: Endoscopy;  Laterality: Left;  . CYSTOSCOPY N/A 11/09/2013   Procedure: CYSTOSCOPY FLEXIBLE;  Surgeon: Nicholas Pippin, MD;  Location: [facility];  Service: Urology;  Laterality: N/A;  . EPIDIDYMECTOMY Left 11/09/2013   Procedure:  LEFT EPIDIDYMECTOMY;  Surgeon: Nicholas Pippin, MD;  Location: [facility];  Service: Urology;  Laterality: Left;  POSSIBLE OUTPATIENT WITH OBSERVATION  . EXCISION LIPOMA LEFT SHOULDER  2004 (APPROX)  . MULTIPLE CYST REMOVED FROM CHEST  AGE 14  . OTHER SURGICAL HISTORY     hemorroid surgery   . TESTICLE REMOVAL Left   . TONSILLECTOMY         Family History  Problem Relation Age of Onset  . Heart failure Mother   .  Diabetes Mother   . Hypertension Mother   . Cirrhosis Father   . Heart failure Father   . Kidney disease Father     Social History   Tobacco Use  . Smoking status: Former Smoker    Packs/day: 0.25    Years: 15.00    Pack years: 3.75    Types: Cigarettes    Quit date: 11/08/1992    Years since quitting: 28.3  . Smokeless tobacco: Never Used  Vaping Use  . Vaping Use: Never used  Substance Use Topics  . Alcohol use: No  . Drug use: No    Home Medications Prior to Admission medications   Medication Sig Start Date End Date Taking? Authorizing Provider  albuterol (PROVENTIL HFA;VENTOLIN HFA) 108 (90 Base) MCG/ACT inhaler Inhale 1-2 puffs into the lungs every 6 (six)  hours as needed for wheezing or shortness of breath. 11/15/18   Rancour, Nicholas SeniorStephen, MD  atorvastatin (LIPITOR) 80 MG tablet Take 1 tablet (80 mg total) by mouth at bedtime. Patient taking differently: Take 80 mg by mouth daily. 09/05/20   Danford, Nicholas Liteshristopher P, MD  budesonide-formoterol (SYMBICORT) 80-4.5 MCG/ACT inhaler Inhale 2 puffs into the lungs daily.    [provider]  clopidogrel (PLAVIX) 75 MG tablet Take 1 tablet (75 mg total) by mouth daily. 07/05/20   Azucena FallenLancaster, Nicholas C, MD  doxycycline (VIBRA-TABS) 100 MG tablet Take 1 tablet (100 mg total) by mouth 2 (two) times daily. 02/07/21   Regal, Nicholas [name], DPM  FARXIGA 10 MG TABS tablet Take 10 mg by mouth daily. 11/05/20   [provider]  fenofibrate (TRICOR) 145 MG tablet Take 1 tablet (145 mg total) by mouth daily. 11/02/20   [name], Ava, DO  gabapentin (NEURONTIN) 400 MG capsule Take 2 capsules (800 mg total) by mouth at bedtime. 12/06/20   [name], Ava, DO  gabapentin (NEURONTIN) 600 MG tablet Take 1 tablet (600 mg total) by mouth daily at 6 (six) AM. 12/06/20   [name], Ava, DO  HUMALOG KWIKPEN 100 UNIT/ML KwikPen Inject 0-14 Units into the skin 4 (four) times daily as needed (high blood sugar). Inject into the skin up to 4 times a day, per sliding scale: BGL 160 or greater = 10 units; 200 or greater = 14 units 11/05/20   [provider]  latanoprost (XALATAN) 0.005 % ophthalmic solution Place 1 drop into both eyes at bedtime.  07/04/20   [provider]  levETIRAcetam (KEPPRA) 250 MG tablet Take 250 mg by mouth in the morning and at bedtime.    [provider]  metFORMIN (GLUCOPHAGE) 1000 MG tablet Take 500 mg by mouth 2 (two) times daily. 11/04/20   [provider]  metoCLOPramide (REGLAN) 10 MG tablet Take 10 mg by mouth 4 (four) times daily.    [provider]  nortriptyline (PAMELOR) 50 MG capsule Take 100 mg by mouth at bedtime.     [provider]   oxyCODONE-acetaminophen (PERCOCET) 10-325 MG tablet Take 1.5 tablets by mouth every 6 (six) hours as needed for pain. 10/24/20   [provider]  oxyCODONE-acetaminophen (PERCOCET) 10-325 MG tablet Take 1 tablet by mouth every 4 (four) hours as needed for pain. 02/07/21   Lenn Sinkegal, Norman [name], DPM  oxyCODONE-acetaminophen (PERCOCET) 5-325 MG tablet Take 1-2 tablets by mouth every 4 (four) hours as needed for severe pain. 01/24/21   Candelaria StagersPatel, Kevin [name], DPM  pantoprazole (PROTONIX) 40 MG tablet Take 40 mg by mouth daily. 07/03/20   [provider]  polyethylene glycol powder (GLYCOLAX/MIRALAX) 17 GM/SCOOP powder Take 17 g by mouth daily as needed for mild constipation. 12/31/15   [provider]  sertraline (ZOLOFT) 50 MG tablet Take 50 mg by mouth daily. 06/13/20   [provider]  SUMAtriptan (IMITREX) 100 MG tablet Take 1 tablet (100 mg total) by mouth daily as needed for migraine or headache. May repeat in 2 hours if headache persists or recurs. 07/25/20   Leatha Gilding, MD  tamsulosin (FLOMAX) 0.4 MG CAPS capsule Take 0.4 mg by mouth in the morning and at bedtime.    [provider]  timolol (TIMOPTIC) 0.5 % ophthalmic solution Place 1 drop into both eyes 2 (two) times daily.  04/06/20   [provider]  zonisamide (ZONEGRAN) 50 MG capsule Take 100 mg by mouth at bedtime.    [provider]    Allergies    Finasteride, Levothyroxine, Nsaids, Amoxicillin, Ampicillin, Penicillins, Strawberry extract, Asa [aspirin], Bactrim [sulfamethoxazole-trimethoprim], Dapagliflozin, Depakote [divalproex sodium], Dilantin [phenytoin], Methocarbamol, Other, Risperidone and related, Sitagliptin, Strawberry (diagnostic), Sulfa antibiotics, Tolmetin, Ultram [tramadol hcl], Buprenorphine, and Oatmeal  Review of Systems   Review of Systems  Unable to perform ROS: Mental status change    Physical Exam Updated Vital Signs BP 111/74   Pulse 81   Temp 98.5 F (36.9  [name]) (Oral)   Resp 16   SpO2 93%   Physical Exam Vitals and nursing note reviewed.  Constitutional:      Appearance: He is well-developed. He is not diaphoretic.  HENT:     Head: Normocephalic and atraumatic.     Mouth/Throat:     Mouth: Mucous membranes are moist.     Pharynx: Oropharynx is clear.     Comments: No apparent intraoral trauma. Eyes:     Conjunctiva/sclera: Conjunctivae normal.  Cardiovascular:     Rate and Rhythm: Normal rate and regular rhythm.     Pulses: Normal pulses.          Radial pulses are 2+ on the right side and 2+ on the left side.       Posterior tibial pulses are 2+ on the right side and 2+ on the left side.     Heart sounds: Normal heart sounds.     Comments: Tactile temperature in the extremities appropriate and equal bilaterally. Pulmonary:     Effort: Pulmonary effort is normal. No respiratory distress.     Breath sounds: Normal breath sounds.     Comments: Breathing is steady and nonlabored.  Normal respiratory rate.  Airway appears to be clear.  No evidence of emesis. Abdominal:     Palpations: Abdomen is soft.     Tenderness: There is no abdominal tenderness. There is no guarding.  Genitourinary:    Comments: No obvious incontinence. Musculoskeletal:     Cervical back: Neck supple.     Right lower leg: No edema.     Left lower leg: No edema.  Lymphadenopathy:     Cervical: No cervical adenopathy.  Skin:    General: Skin is warm and dry.  Neurological:     Mental Status: He is unresponsive.     Comments: When I initially went to assess the patient, he did open his eyes to voice.  He turned his head to look at me. When I asked him if he has a history of seizures, he said yes, but then shut his eyes and would not respond again.  Psychiatric:        Mood and  Affect: Mood and affect normal.        Speech: Speech normal.        Behavior: Behavior normal.     ED Results / Procedures / Treatments   Labs (all labs ordered are listed, but  only abnormal results are displayed) Labs Reviewed  CBC - Abnormal; Notable for the following components:      Result Value   Hemoglobin 12.6 (*)    All other components within normal limits  BASIC METABOLIC PANEL - Abnormal; Notable for the following components:   Glucose, Bld 119 (*)    BUN 21 (*)    Creatinine, Ser 1.34 (*)    All other components within normal limits  HEPATIC FUNCTION PANEL - Abnormal; Notable for the following components:   Total Bilirubin 0.1 (*)    All other components within normal limits  RAPID URINE DRUG SCREEN, HOSP PERFORMED - Abnormal; Notable for the following components:   Benzodiazepines POSITIVE (*)    All other components within normal limits  URINALYSIS, ROUTINE W REFLEX MICROSCOPIC - Abnormal; Notable for the following components:   Specific Gravity, Urine 1.031 (*)    Glucose, UA >=500 (*)    All other components within normal limits  BLOOD GAS, ARTERIAL - Abnormal; Notable for the following components:   pH, Arterial 7.333 (*)    pO2, Arterial 73.5 (*)    Acid-base deficit 2.3 (*)    All other components within normal limits  CBG MONITORING, ED - Abnormal; Notable for the following components:   Glucose-Capillary 130 (*)    All other components within normal limits  CBG MONITORING, ED - Abnormal; Notable for the following components:   Glucose-Capillary 117 (*)    All other components within normal limits  RESP PANEL BY RT-PCR (FLU A&B, COVID) ARPGX2  ETHANOL  LEVETIRACETAM LEVEL    EKG None  Radiology CT Head Wo Contrast  Result Date: 03/12/2021 CLINICAL DATA:  Seizure EXAM: CT HEAD WITHOUT CONTRAST TECHNIQUE: Contiguous axial images were obtained from the base of the skull through the vertex without intravenous contrast. COMPARISON:  12/03/2020 FINDINGS: Brain: In in No acute intracranial abnormality. Specifically, no hemorrhage, hydrocephalus, mass lesion, acute infarction, or significant intracranial injury. Vascular: No hyperdense  vessel or unexpected calcification. Skull: No acute calvarial abnormality. Sinuses/Orbits: Visualized paranasal sinuses and mastoids clear. Orbital soft tissues unremarkable. Other: None IMPRESSION: Normal study. Electronically Signed   By: Charlett Nose M.D.   On: 03/12/2021 21:05   CT Cervical Spine Wo Contrast  Result Date: 03/12/2021 CLINICAL DATA:  Seizure EXAM: CT CERVICAL SPINE WITHOUT CONTRAST TECHNIQUE: Multidetector CT imaging of the cervical spine was performed without intravenous contrast. Multiplanar CT image reconstructions were also generated. COMPARISON:  None. FINDINGS: Alignment: Normal Skull base and vertebrae: No acute fracture. No primary bone lesion or focal pathologic process. Soft tissues and spinal canal: No prevertebral fluid or swelling. No visible canal hematoma. Disc levels: Prior anterior fusion from C4-C7. Anterior plate in place at C4-5. Diffuse degenerative disc disease and facet disease. Upper chest: No acute findings Other: None IMPRESSION: Prior cervical fusion. Cervical spondylosis. No acute bony abnormality. Electronically Signed   By: Charlett Nose M.D.   On: 03/12/2021 21:06   DG Chest Portable 1 View  Result Date: 03/12/2021 CLINICAL DATA:  Altered mental status EXAM: PORTABLE CHEST 1 VIEW COMPARISON:  Chest x-ray 12/04/2020, CT heart 10/22/2020 FINDINGS: The heart size and mediastinal contours are within normal limits. No focal consolidation. No pulmonary edema. No pleural effusion.  No pneumothorax. No acute osseous abnormality. IMPRESSION: No active disease. Electronically Signed   By: Tish Frederickson M.D.   On: 03/12/2021 18:04    Procedures Procedures   Medications Ordered in ED Medications  LORazepam (ATIVAN) 2 MG/ML injection (has no administration in time range)  levETIRAcetam (KEPPRA) 2,000 mg in sodium chloride 0.9 % 250 mL IVPB (0 mg Intravenous Stopped 03/12/21 1901)  naloxone (NARCAN) injection 1 mg (1 mg Intravenous Given 03/12/21 1905)  ondansetron  (ZOFRAN) injection 4 mg (4 mg Intravenous Given 03/12/21 1905)  iohexol (OMNIPAQUE) 350 MG/ML injection 100 mL (100 mLs Intravenous Contrast Given 03/12/21 2306)    ED Course  I have reviewed the triage vital signs and the nursing notes.  Pertinent labs & imaging results that were available during my care of the patient were reviewed by me and considered in my medical decision making (see chart for details).  Clinical Course as of 03/12/21 2342  Wed Mar 12, 2021  1906 Patient now more alert, but noted to have slurred speech. Difficulty moving facial muscles. Tongue slightly protruding through the teeth, but not noted to be swollen. He has adequate grip strength and motor function in the left upper extremity, however, has no noted motor function in the right upper extremity. However, it did seem as though patient was trying to say he has a history of this.  Discussed this with EDP, Nicholas. Rhunette Croft. After personally assessing patient, recommends no code stroke activation. He placed an order for Teleneuro evaluation. [SJ]  1909 Called patient'[name] listed point of contact, [place] [phone]). No answer. Left VM. [SJ]    Clinical Course User Index [SJ] Louis Gaw, Hillard Danker, PA-[name]   MDM Rules/Calculators/A&[name]                          Patient presents with reported seizures on scene and with EMS. Initially, there was quite a bit of concern of patient'[name] continued seizure or prolonged postictal.  Chart review reveals patient was seen and admitted February 03, 2021.  Diagnoses for that admission included seizure-like activity, hypercapnia (CO2 on ABG 53.7 with pH of 7.29), and encephalopathy.  It was quite difficult to get a hold of the teleneuro system provider and this delayed neurologist assessment of the patient. We were unable to establish when the patient was last normal.  On chart review, it seems as though patient has had presentation where code stroke was activated and subsequent imaging was  without acute abnormality. Patient would intermittently wake up and then go back to being nonresponsive.  Because of this, it seemed as though patient'[name] presentation was fluctuating.  After teleneuro assessment, code stroke activation was recommended and this was carried out.  Patient was then transferred to [facility] ED.  Findings and plan of care discussed with attending physician, Derwood Kaplan, MD. Nicholas. Rhunette Croft personally evaluated and examined this patient.  Final Clinical Impression([name]) / ED Diagnoses Final diagnoses:  Seizure-like activity (HCC)  Altered mental status, unspecified altered mental status type    Rx / DC Orders ED Discharge Orders    None       Concepcion Living 03/12/21 2343    Derwood Kaplan, MD 03/13/21 [phone]

## 2021-03-12 NOTE — ED Triage Notes (Signed)
Pt presents to ED from [facility]. C/o same. See chart

## 2021-03-12 NOTE — ED Provider Notes (Addendum)
Patient being transferred from [facility].  Initially presented with several episodes of seizure-like activity received Versed in route and upon arrival to the emergency room patient was very somnolent.  Patient then started coming around and had a teleneurology visit.  At that time he was complaining of difficulty talking and right-sided weakness with reported flaccid paralysis.  Teleneurology activated a code stroke and patient had CT, CTA and perfusion done at [facility].  The reported teleneurologist stated that they did not show any acute findings.  However because this was activated patient was transferred from [facility] to [facility].  Our neurologist are not aware of this patient.  Teleneurology did recommend that patient needed an MRI.  Also because of the seizure-like activity, history of seizures and has been loaded with 2 g of Keppra he needs EEG monitoring.  His labs do not show acute findings except being positive for benzodiazepines, blood gases normal, Covid is negative.  Will discuss with neurology. 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  Patient had another episode here where he arched his back and held his breath however it resolved spontaneously and throughout this episode low suspicion that he was having true seizure.  Neurology felt that if patient had a negative MRI and was back to baseline he was stable for discharge for follow-up and this may be more behavioral in nature.   Gwyneth Sprout, MD 03/12/21 [id number]    Gwyneth Sprout, MD 03/12/21 2356

## 2021-03-13 ENCOUNTER — Emergency Department (HOSPITAL_COMMUNITY): Payer: Medicare (Managed Care)

## 2021-03-13 DIAGNOSIS — R569 Unspecified convulsions: Secondary | ICD-10-CM | POA: Diagnosis not present

## 2021-03-13 NOTE — ED Notes (Signed)
Ativan ordered verbally per doctor Nanavati. Medication then discontinued prior to administration. 2 mg ativan wasted in sharps container.

## 2021-03-13 NOTE — ED Provider Notes (Signed)
Care assumed from Dr. Anitra Lauth.  At time of transfer care, patient is awaiting MRI of the brain.  After MRI, plan of care is to speak with neurology to discuss disposition but if patient is doing better, anticipate discharge home per plan.  MRI returned with no acute abnormality.  Spoke to neurology who reviewed the images with me and they feel patient safe for discharge home.  Patient can follow-up with outpatient neurology.  Continue her medications.  Patient will be discharged per neurology plan.    Clinical Impression: 1. Seizure-like activity (HCC)   2. Altered mental status, unspecified altered mental status type     Disposition: Discharge  Condition: Good  I have discussed the results, Dx and Tx plan with the pt(& family if present). He/she/they expressed understanding and agree(s) with the plan. Discharge instructions discussed at great length. Strict return precautions discussed and pt &/or family have verbalized understanding of the instructions. No further questions at time of discharge.    New Prescriptions   No medications on file    Follow Up: [facility] [address]     [address] [place] [address] [phone]    [facility] EMERGENCY DEPARTMENT [address] [id number] mc [place] [address] [phone]    Vladimir Crofts, FNP [address] [place] [address] [phone]         Libero Puthoff, Canary Brim, MD 03/13/21 [id number]

## 2021-03-13 NOTE — Consult Note (Addendum)
[facility] Tele[facility]urology Consult Services   Date of Service:   03/12/2021 22:25:35  Diagnosis:     .  R53.1 - Weakness  Impression:     . 60 yo male with history of psychogenic non-epileptic spells (PNES), ?epilepsy, HTN, HLD, DM, likely conversion disorder presenting to the ED with witnessed seizure activity at ~1500. Received 2.5 mg of Versed en route to the ED, loaded with Keppra 2,000 mg at ~1830 and was somnolent in the ED until ~2100 when he woke up with R sided weakness/numbness, embellished dysarthria and aphasia. He had recurrent episode of seizure activity, but only with L sided shaking and unresponsiveness that resolved after a few seconds. Patient with previous hospitalizations for similar symptoms with negative work-ups concerning for psychogenic etiology. CTA/CTP negative for acute findings. Repeat CT Head not completed, though was recommended. Plan to admit for MRI Brain to r/o stroke as conversion disorder is a diagnosis of exclusion. Continue Plavix and Keppra home meds.  Metrics: Last Known Well: Unknown [facility] Notification Time: 03/12/2021 22:25:35 Stamp Time: 03/12/2021 22:25:35 Initial Response Time: 03/12/2021 22:27:12 Symptoms: R sided weakness, aphasia. NIHSS Start Assessment Time: 03/12/2021 22:28:09 Patient is not a candidate for Thrombolytic. Thrombolytic Medical Decision: 03/12/2021 22:29:14 Patient was not deemed candidate for Thrombolytic because of following reasons: Last Well Known Above 4.5 Hours.  CT head was not reviewed.  Advanced Imaging: CTA Head and Neck Completed.  CTP Completed.  LVO:No   Our recommendations are outlined below.  Recommendations:      .  Stroke/Telemetry Floor     .  Neuro Checks     .  Bedside Swallow Eval     .  DVT Prophylaxis     .  IV Fluids, Normal Saline     .  Head of Bed 30 Degrees     .  Euglycemia and Avoid Hyperthermia (PRN Acetaminophen)     .  Initiate Clopidogrel 75 mg  daily     .  Antihypertensives PRN if Blood pressure is greater than 220/120 or there is a concern for End organ damage/contraindications for permissive HTN. If blood pressure is greater than 220/120 give labetalol PO or IV or Vasotec IV with a goal of 15% reduction in BP during the first 24 hours.     .  Continue home dose Keppra  Routine Consultation with Inhouse Neurology for Follow up Care  Sign Out:     .  Discussed with Emergency Department Provider    ------------------------------------------------------------------------------  History of Present Illness: Patient is a 60 year old Male.   Patient presenting to the ED with witnessed seizure activity in a hotel lobby at ~1500. Unclear LKN. Arrived to the ED at 1600 with seizure-like activity. Received 2.5 mg Versed en route. No additional BZs given in the ED because seizure-activity resolved. He was somnolent and postictal. At ~2100 he woke up with symptoms of R sided weakness, dysarthria and aphasia.  Last seen normal was beyond 4.5 hours of presentation.  Past Medical History:     . Hypertension     . Diabetes Mellitus     . Hyperlipidemia     . epilepsy, PNES  Anticoagulant use:  No  Antiplatelet use: Yes Plavix  Allergies:  Reviewed     Examination: BP(115/96), Pulse(66), Blood Glucose(117) 1A: Level of Consciousness - Alert; keenly responsive + 0 1B: Ask Month and Age - Aphasic + 2 1C: Blink Eyes & Squeeze Hands - Performs Both Tasks + 0 2: Test Horizontal Extraocular  Movements - Normal + 0 3: Test Visual Fields - No Visual Loss + 0 4: Test Facial Palsy (Use Grimace if Obtunded) - Partial paralysis (lower face) + 2 5A: Test Left Arm Motor Drift - No Drift for 10 Seconds + 0 5B: Test Right Arm Motor Drift - No Movement + 4 6A: Test Left Leg Motor Drift - No Drift for 5 Seconds + 0 6B: Test Right Leg Motor Drift - No Movement + 4 7: Test Limb Ataxia (FNF/Heel-Shin) - No Ataxia + 0 8: Test Sensation - Complete  Loss: Cannot Sense Being Touched At All + 2 9: Test Language/Aphasia - Mild-Moderate Aphasia: Some Obvious Changes, Without Significant Limitation + 1 10: Test Dysarthria - Mild-Moderate Dysarthria: Slurring but can be understood + 1 11: Test Extinction/Inattention - No abnormality + 0  NIHSS Score: 16  Pre-Morbid Modified Rankin Scale: 0 Points = No symptoms at all   Patient/Family was informed the Neurology Consult would occur via TeleHealth consult by way of interactive audio and video telecommunications and consented to receiving care in this manner.   Patient is being evaluated for possible acute neurologic impairment and high probability of imminent or life-threatening deterioration. I spent total of 50 minutes providing care to this patient, including time for face to face visit via telemedicine, review of medical records, imaging studies and discussion of findings with providers, the patient and/or family.   Dr Harrie Jeans   [facility] [phone]  Case [id number]

## 2021-03-13 NOTE — Discharge Instructions (Signed)
Your MRI today was overall reassuring with no evidence of acute abnormality.  No stroke seen and no masses or bleeding seen.  I spoke to the neurology team who feels you are safe for discharge home.  Please rest and stay hydrated and continue your home medications.  Please follow-up with outpatient neurology for further work-up and management.  If any symptoms change or worsen, please return to the nearest emergency department.

## 2021-03-13 NOTE — ED Notes (Addendum)
Pt transported to MRI 

## 2021-03-13 NOTE — ED Notes (Signed)
Pt returned from MRI resting comfortably

## 2021-04-21 ENCOUNTER — Ambulatory Visit: Payer: Medicare Other | Admitting: Neurology

## 2021-04-24 ENCOUNTER — Other Ambulatory Visit: Payer: Self-pay | Admitting: Physician Assistant

## 2021-04-24 ENCOUNTER — Ambulatory Visit
Admission: RE | Admit: 2021-04-24 | Discharge: 2021-04-24 | Disposition: A | Discharge status: Home / Self Care | Payer: Medicaid Other | Source: Ambulatory Visit | Attending: Physician Assistant | Admitting: Physician Assistant

## 2021-04-24 DIAGNOSIS — M25561 Pain in right knee: Secondary | ICD-10-CM

## 2021-06-20 ENCOUNTER — Emergency Department (HOSPITAL_BASED_OUTPATIENT_CLINIC_OR_DEPARTMENT_OTHER): Payer: Medicare (Managed Care)

## 2021-06-20 ENCOUNTER — Other Ambulatory Visit: Payer: Self-pay

## 2021-06-20 ENCOUNTER — Emergency Department (HOSPITAL_BASED_OUTPATIENT_CLINIC_OR_DEPARTMENT_OTHER)
Admission: EM | Admit: 2021-06-20 | Discharge: 2021-06-20 | Disposition: A | Discharge status: Home / Self Care | Payer: Medicare (Managed Care) | Attending: Emergency Medicine | Admitting: Emergency Medicine

## 2021-06-20 ENCOUNTER — Encounter (HOSPITAL_BASED_OUTPATIENT_CLINIC_OR_DEPARTMENT_OTHER): Payer: Self-pay | Admitting: Emergency Medicine

## 2021-06-20 DIAGNOSIS — Z79899 Other long term (current) drug therapy: Secondary | ICD-10-CM | POA: Insufficient documentation

## 2021-06-20 DIAGNOSIS — R109 Unspecified abdominal pain: Secondary | ICD-10-CM | POA: Diagnosis not present

## 2021-06-20 DIAGNOSIS — N183 Chronic kidney disease, stage 3 unspecified: Secondary | ICD-10-CM | POA: Diagnosis not present

## 2021-06-20 DIAGNOSIS — Z794 Long term (current) use of insulin: Secondary | ICD-10-CM | POA: Insufficient documentation

## 2021-06-20 DIAGNOSIS — Z87891 Personal history of nicotine dependence: Secondary | ICD-10-CM | POA: Insufficient documentation

## 2021-06-20 DIAGNOSIS — Z7984 Long term (current) use of oral hypoglycemic drugs: Secondary | ICD-10-CM | POA: Insufficient documentation

## 2021-06-20 DIAGNOSIS — Z7902 Long term (current) use of antithrombotics/antiplatelets: Secondary | ICD-10-CM | POA: Insufficient documentation

## 2021-06-20 DIAGNOSIS — J189 Pneumonia, unspecified organism: Secondary | ICD-10-CM | POA: Insufficient documentation

## 2021-06-20 DIAGNOSIS — R519 Headache, unspecified: Secondary | ICD-10-CM | POA: Diagnosis not present

## 2021-06-20 DIAGNOSIS — E1122 Type 2 diabetes mellitus with diabetic chronic kidney disease: Secondary | ICD-10-CM | POA: Insufficient documentation

## 2021-06-20 DIAGNOSIS — J45909 Unspecified asthma, uncomplicated: Secondary | ICD-10-CM | POA: Diagnosis not present

## 2021-06-20 DIAGNOSIS — R059 Cough, unspecified: Secondary | ICD-10-CM | POA: Diagnosis present

## 2021-06-20 DIAGNOSIS — I129 Hypertensive chronic kidney disease with stage 1 through stage 4 chronic kidney disease, or unspecified chronic kidney disease: Secondary | ICD-10-CM | POA: Insufficient documentation

## 2021-06-20 LAB — URINALYSIS, ROUTINE W REFLEX MICROSCOPIC
Bilirubin Urine: NEGATIVE
Glucose, UA: >=500 mg/dL — AB
Hgb urine dipstick: NEGATIVE
Ketones, ur: NEGATIVE mg/dL
Leukocytes,Ua: NEGATIVE
Nitrite: NEGATIVE
Protein, ur: NEGATIVE mg/dL
Specific Gravity, Urine: 1.015 (ref 1.005–1.030)
pH: 6 (ref 5.0–8.0)

## 2021-06-20 LAB — COMPREHENSIVE METABOLIC PANEL
ALT: 29 U/L (ref 0–44)
AST: 24 U/L (ref 15–41)
Albumin: 4 g/dL (ref 3.5–5.0)
Alkaline Phosphatase: 48 U/L (ref 38–126)
Anion gap: 6 (ref 5–15)
BUN: 19 mg/dL (ref 6–20)
CO2: 27 mmol/L (ref 22–32)
Calcium: 9.5 mg/dL (ref 8.9–10.3)
Chloride: 105 mmol/L (ref 98–111)
Creatinine, Ser: 1.36 mg/dL — ABNORMAL HIGH (ref 0.61–1.24)
GFR, Estimated: 60 mL/min — ABNORMAL LOW (ref >60)
Glucose, Bld: 117 mg/dL — ABNORMAL HIGH (ref 70–99)
Potassium: 4.4 mmol/L (ref 3.5–5.1)
Sodium: 138 mmol/L (ref 135–145)
Total Bilirubin: 0.4 mg/dL (ref 0.3–1.2)
Total Protein: 7 g/dL (ref 6.5–8.1)

## 2021-06-20 LAB — CBC WITH DIFFERENTIAL/PLATELET
Abs Immature Granulocytes: 0.02 10*3/uL (ref 0.00–0.07)
Basophils Absolute: 0 10*3/uL (ref 0.0–0.1)
Basophils Relative: 0 %
Eosinophils Absolute: 0.1 10*3/uL (ref 0.0–0.5)
Eosinophils Relative: 2 %
HCT: 41 % (ref 39.0–52.0)
Hemoglobin: 13.2 g/dL (ref 13.0–17.0)
Immature Granulocytes: 0 %
Lymphocytes Relative: 24 %
Lymphs Abs: 1.3 10*3/uL (ref 0.7–4.0)
MCH: 27.6 pg (ref 26.0–34.0)
MCHC: 32.2 g/dL (ref 30.0–36.0)
MCV: 85.8 fL (ref 80.0–100.0)
Monocytes Absolute: 0.6 10*3/uL (ref 0.1–1.0)
Monocytes Relative: 10 %
Neutro Abs: 3.5 10*3/uL (ref 1.7–7.7)
Neutrophils Relative %: 64 %
Platelets: 142 10*3/uL — ABNORMAL LOW (ref 150–400)
RBC: 4.78 MIL/uL (ref 4.22–5.81)
RDW: 15.8 % — ABNORMAL HIGH (ref 11.5–15.5)
WBC: 5.5 10*3/uL (ref 4.0–10.5)
nRBC: 0 % (ref 0.0–0.2)

## 2021-06-20 LAB — URINALYSIS, MICROSCOPIC (REFLEX)

## 2021-06-20 LAB — OCCULT BLOOD X 1 CARD TO LAB, STOOL: Fecal Occult Bld: NEGATIVE

## 2021-06-20 LAB — TROPONIN I (HIGH SENSITIVITY)
Troponin I (High Sensitivity): 3 ng/L (ref <18)
Troponin I (High Sensitivity): 3 ng/L (ref <18)

## 2021-06-20 LAB — LIPASE, BLOOD: Lipase: 22 U/L (ref 11–51)

## 2021-06-20 MED ORDER — DOXYCYCLINE HYCLATE 100 MG PO TABS
100.0000 mg | ORAL_TABLET | Freq: Once | ORAL | Status: AC
  Administered 2021-06-20: 100 mg via ORAL
  Filled 2021-06-20: qty 1

## 2021-06-20 MED ORDER — OXYCODONE-ACETAMINOPHEN 5-325 MG PO TABS
1.0000 | ORAL_TABLET | Freq: Once | ORAL | Status: AC
Start: 2021-06-20 — End: 2021-06-20
  Administered 2021-06-20: 1 via ORAL
  Filled 2021-06-20: qty 1

## 2021-06-20 MED ORDER — LACTATED RINGERS IV BOLUS
1000.0000 mL | Freq: Once | INTRAVENOUS | Status: AC
  Administered 2021-06-20: 1000 mL via INTRAVENOUS

## 2021-06-20 MED ORDER — OXYCODONE-ACETAMINOPHEN 5-325 MG PO TABS
1.0000 | ORAL_TABLET | Freq: Once | ORAL | Status: AC
  Administered 2021-06-20: 1 via ORAL
  Filled 2021-06-20: qty 1

## 2021-06-20 NOTE — ED Triage Notes (Addendum)
Pt states he went to his dr on Tuesday and was given a shot of toradol for c/o headaches that he has been having for the past couple of weeks   Pt states since the shot he has been having throat pain, and pain in his tongue  Pt also adds that he has been having chest pain that started yesterday  Pt states he has had some shortness of breath  Pt states tonight when he woke up he went to get up and fell   Pt also adds that he started having bloody diarrhea tonight

## 2021-06-20 NOTE — ED Notes (Signed)
Patient reports chest pain x 2 days, throat pain, bloody diarrhea, shortness of breath

## 2021-06-20 NOTE — ED Notes (Signed)
Patient Alert and oriented. Socks placed on patient. Voided 400 cc of yellow urine.

## 2021-06-20 NOTE — ED Provider Notes (Signed)
[facility] EMERGENCY DEPARTMENT Provider Note   CSN: [id number] Arrival date & time: 06/20/21  0301     History Chief Complaint  Patient presents with   Multiple complaints     Nicholas [name] is a 60 y.o. male.  Patient is a 60 year old male with a history of epilepsy, GERD status post abdominal surgery for gastric ulcers, hypertension, hypothyroidism, diabetes, CKD, prior CVA who is presenting today with multiple complaints.  Patient reports that since Tuesday he has not been feeling well.  He has had a headache for several weeks and went to his doctor and got a Toradol shot.  Since that time he is been having pain in his tongue and his throat, cough with green and yellow sputum and even some burning in his chest earlier today.  He has had some mild intermittent shortness of breath.  He has ongoing issues with intermittent abdominal pain and bloating and had a CT scan done 2 days ago for that and is following up with GI.  For a week he has noticed some dark blood in his stools that has been intermittent.  Other than the Toradol which she was given by his doctor he is not taking any NSAIDs.  He has not had known fever.  He is just felt generally not well.  He does report that he had a fall into his closet today and his wife found him.  He thinks he most likely had a seizure that caused this.  He has been taking his seizure medication regularly but will occasionally have breakthrough.  The history is provided by the patient.      Past Medical History:  Diagnosis Date   Asthma    Bipolar 1 disorder (HCC)    Borderline glaucoma    Chronic pain    Epididymal pain    LEFT   Epilepsy, 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31---  LAST SEIZURE 1 WK AGO (APPROX ,  10-31-2013)   NO NEUROLOGIST---  PT SEES PCP  DR Lindajo RoyalAVLOUT   Feeling of incomplete bladder emptying    Frequency of urination    Gastric ulcer    GERD (gastroesophageal reflux disease)    Hypertension    Hyperthyroidism    NO MEDS     Migraine    Seizures (HCC)    TIA (transient ischemic attack)    Type 2 diabetes mellitus [facility](HCC)     Patient Active Problem List   Diagnosis Date Noted   Seizures (HCC) 12/03/2020   Rectal bleeding 10/31/2020   AKI (acute kidney injury) (HCC) 10/31/2020   Cardiomyopathy (HCC) 09/23/2020   Atypical chest pain 09/23/2020   Precordial pain    CVA (cerebral vascular accident) (HCC) 09/02/2020   Altered mental status 07/23/2020   Seizure (HCC) 07/22/2020   Left-sided weakness 07/04/2020   Dysarthria 07/04/2020   Essential hypertension    Seizure disorder (HCC) 07/02/2017   Insulin-requiring or dependent type II diabetes mellitus (HCC) 07/02/2017   CKD (chronic kidney disease), stage III (HCC) 07/02/2017   Normocytic anemia 07/02/2017   Testicular/scrotal pain 11/09/2013   Microhematuria 11/09/2013   Condyloma acuminatum of scrotum 11/09/2013    Past Surgical History:  Procedure Laterality Date   ABDOMINAL SURGERY     ANTERIOR CERVICAL DECOMP/DISCECTOMY FUSION  2007   C4  --  C6   CERVICAL FUSION     CHOLECYSTECTOMY     COLONOSCOPY WITH PROPOFOL Left 11/02/2020   Procedure: COLONOSCOPY WITH PROPOFOL;  Surgeon: Willis Modenautlaw, William, MD;  Location: [facility];  Service: Endoscopy;  Laterality: Left;   CYSTOSCOPY N/A 11/09/2013   Procedure: CYSTOSCOPY FLEXIBLE;  Surgeon: Bjorn Pippin, MD;  Location: [facility];  Service: Urology;  Laterality: N/A;   EPIDIDYMECTOMY Left 11/09/2013   Procedure:  LEFT EPIDIDYMECTOMY;  Surgeon: Bjorn Pippin, MD;  Location: [facility];  Service: Urology;  Laterality: Left;  POSSIBLE OUTPATIENT WITH OBSERVATION   EXCISION LIPOMA LEFT SHOULDER  2004 (APPROX)   MULTIPLE CYST REMOVED FROM CHEST  AGE 20   OTHER SURGICAL HISTORY     hemorroid surgery    TESTICLE REMOVAL Left    TONSILLECTOMY         Family History  Problem Relation Age of Onset   Heart failure Mother    Diabetes Mother    Hypertension Mother    Cirrhosis  Father    Heart failure Father    Kidney disease Father     Social History   Tobacco Use   Smoking status: Former    Packs/day: 0.25    Years: 15.00    Pack years: 3.75    Types: Cigarettes    Quit date: 11/08/1992    Years since quitting: 28.6   Smokeless tobacco: Never  Vaping Use   Vaping Use: Never used  Substance Use Topics   Alcohol use: No   Drug use: No    Home Medications Prior to Admission medications   Medication Sig Start Date End Date Taking? Authorizing Provider  albuterol (PROVENTIL HFA;VENTOLIN HFA) 108 (90 Base) MCG/ACT inhaler Inhale 1-2 puffs into the lungs every 6 (six) hours as needed for wheezing or shortness of breath. 11/15/18   Rancour, Jeannett Senior, MD  atorvastatin (LIPITOR) 80 MG tablet Take 1 tablet (80 mg total) by mouth at bedtime. Patient taking differently: Take 80 mg by mouth daily. 09/05/20   Danford, Earl Lites, MD  budesonide-formoterol (SYMBICORT) 80-4.5 MCG/ACT inhaler Inhale 2 puffs into the lungs daily.    [provider]  clopidogrel (PLAVIX) 75 MG tablet Take 1 tablet (75 mg total) by mouth daily. 07/05/20   Azucena Fallen, MD  doxycycline (VIBRA-TABS) 100 MG tablet Take 1 tablet (100 mg total) by mouth 2 (two) times daily. 02/07/21   Regal, Kirstie Peri, DPM  FARXIGA 10 MG TABS tablet Take 10 mg by mouth daily. 11/05/20   [provider]  fenofibrate (TRICOR) 145 MG tablet Take 1 tablet (145 mg total) by mouth daily. 11/02/20   Swayze, Ava, DO  gabapentin (NEURONTIN) 400 MG capsule Take 2 capsules (800 mg total) by mouth at bedtime. 12/06/20   Swayze, Ava, DO  gabapentin (NEURONTIN) 600 MG tablet Take 1 tablet (600 mg total) by mouth daily at 6 (six) AM. 12/06/20   Swayze, Ava, DO  HUMALOG KWIKPEN 100 UNIT/ML KwikPen Inject 0-14 Units into the skin 4 (four) times daily as needed (high blood sugar). Inject into the skin up to 4 times a day, per sliding scale: BGL 160 or greater = 10 units; 200 or greater = 14 units 11/05/20    [provider]  latanoprost (XALATAN) 0.005 % ophthalmic solution Place 1 drop into both eyes at bedtime.  07/04/20   [provider]  levETIRAcetam (KEPPRA) 250 MG tablet Take 250 mg by mouth in the morning and at bedtime.    [provider]  metFORMIN (GLUCOPHAGE) 1000 MG tablet Take 500 mg by mouth 2 (two) times daily. 11/04/20   [provider]  metoCLOPramide (REGLAN) 10 MG tablet Take  10 mg by mouth 4 (four) times daily.    [provider]  nortriptyline (PAMELOR) 50 MG capsule Take 100 mg by mouth at bedtime.     [provider]  oxyCODONE-acetaminophen (PERCOCET) 10-325 MG tablet Take 1.5 tablets by mouth every 6 (six) hours as needed for pain. 10/24/20   [provider]  oxyCODONE-acetaminophen (PERCOCET) 10-325 MG tablet Take 1 tablet by mouth every 4 (four) hours as needed for pain. 02/07/21   Lenn Sink, DPM  oxyCODONE-acetaminophen (PERCOCET) 5-325 MG tablet Take 1-2 tablets by mouth every 4 (four) hours as needed for severe pain. 01/24/21   Candelaria Stagers, DPM  pantoprazole (PROTONIX) 40 MG tablet Take 40 mg by mouth daily. 07/03/20   [provider]  polyethylene glycol powder (GLYCOLAX/MIRALAX) 17 GM/SCOOP powder Take 17 g by mouth daily as needed for mild constipation. 12/31/15   [provider]  sertraline (ZOLOFT) 50 MG tablet Take 50 mg by mouth daily. 06/13/20   [provider]  SUMAtriptan (IMITREX) 100 MG tablet Take 1 tablet (100 mg total) by mouth daily as needed for migraine or headache. May repeat in 2 hours if headache persists or recurs. 07/25/20   Leatha Gilding, MD  tamsulosin (FLOMAX) 0.4 MG CAPS capsule Take 0.4 mg by mouth in the morning and at bedtime.    [provider]  timolol (TIMOPTIC) 0.5 % ophthalmic solution Place 1 drop into both eyes 2 (two) times daily.  04/06/20   [provider]  zonisamide (ZONEGRAN) 50 MG capsule Take 100 mg by mouth at bedtime.     [provider]    Allergies    Finasteride, Levothyroxine, Nsaids, Amoxicillin, Ampicillin, Penicillins, Strawberry extract, Asa [aspirin], Bactrim [sulfamethoxazole-trimethoprim], Dapagliflozin, Depakote [divalproex sodium], Dilantin [phenytoin], Methocarbamol, Other, Risperidone and related, Sitagliptin, Strawberry (diagnostic), Sulfa antibiotics, Tolmetin, Ultram [tramadol hcl], Buprenorphine, and Oatmeal  Review of Systems   Review of Systems  All other systems reviewed and are negative.  Physical Exam Updated Vital Signs BP (!) 142/82 (BP Location: Left Arm)   Pulse 61   Temp 98.1 F (36.7 C) (Oral)   Resp 18   Ht 5\' 7"  (1.702 m)   Wt 89.8 kg   SpO2 97%   BMI 31.01 kg/m   Physical Exam Vitals and nursing note reviewed.  Constitutional:      General: He is not in acute distress.    Appearance: He is well-developed.  HENT:     Head: Normocephalic and atraumatic.     Nose: Nose normal.     Mouth/Throat:     Mouth: Mucous membranes are dry.  Eyes:     Conjunctiva/sclera: Conjunctivae normal.     Pupils: Pupils are equal, round, and reactive to light.  Cardiovascular:     Rate and Rhythm: Normal rate and regular rhythm.     Heart sounds: No murmur heard. Pulmonary:     Effort: Pulmonary effort is normal. No respiratory distress.     Breath sounds: Normal breath sounds. No wheezing or rales.     Comments: Decreased breath sounds in the bases Abdominal:     General: There is no distension.     Palpations: Abdomen is soft.     Tenderness: There is no abdominal tenderness. There is no guarding or rebound.     Comments: Well-healed midline abdominal scar  Genitourinary:    Rectum: Normal. Guaiac result negative.  Musculoskeletal:        General: No tenderness. Normal range of motion.  Cervical back: Normal range of motion and neck supple.     Right lower leg: No edema.     Left lower leg: No edema.  Skin:    General: Skin is warm and dry.     Findings:  No erythema or rash.  Neurological:     Mental Status: He is alert and oriented to person, place, and time. Mental status is at baseline.  Psychiatric:        Mood and Affect: Mood normal.        Behavior: Behavior normal.    ED Results / Procedures / Treatments   Labs (all labs ordered are listed, but only abnormal results are displayed) Labs Reviewed  URINALYSIS, ROUTINE W REFLEX MICROSCOPIC - Abnormal; Notable for the following components:      Result Value   Glucose, UA >=500 (*)    All other components within normal limits  CBC WITH DIFFERENTIAL/PLATELET - Abnormal; Notable for the following components:   RDW 15.8 (*)    Platelets 142 (*)    All other components within normal limits  COMPREHENSIVE METABOLIC PANEL - Abnormal; Notable for the following components:   Glucose, Bld 117 (*)    Creatinine, Ser 1.36 (*)    GFR, Estimated 60 (*)    All other components within normal limits  URINALYSIS, MICROSCOPIC (REFLEX) - Abnormal; Notable for the following components:   Bacteria, UA FEW (*)    All other components within normal limits  LIPASE, BLOOD  OCCULT BLOOD X 1 CARD TO LAB, STOOL  TROPONIN I (HIGH SENSITIVITY)  TROPONIN I (HIGH SENSITIVITY)    EKG EKG Interpretation  Date/Time:  Friday June 20 2021 03:46:01 EDT Ventricular Rate:  85 PR Interval:  166 QRS Duration: 80 QT Interval:  348 QTC Calculation: 414 R Axis:   -40 Text Interpretation: Normal sinus rhythm Left axis deviation Abnormal ECG Rate is faster Confirmed by Paula Libra ([id number]) on 06/20/2021 7:11:32 AM  Radiology DG Chest Port 1 View  Result Date: 06/20/2021 CLINICAL DATA:  60 year old male with history of chest pain. EXAM: PORTABLE CHEST 1 VIEW COMPARISON:  Chest x-ray 03/12/2021. FINDINGS: Patchy areas of interstitial prominence an ill-defined opacities noted in the left mid to lower lung where there is also peribronchial cuffing. Right lung is clear. No pleural effusions. No pneumothorax. No  evidence of pulmonary edema. Heart size is mildly enlarged. The patient is rotated to the right on today's exam, resulting in distortion of the mediastinal contours and reduced diagnostic sensitivity and specificity for mediastinal pathology. Surgical clips in the lower cervical region incidentally noted. IMPRESSION: 1. Findings are concerning for potential developing bronchopneumonia in the left lower lobe. Followup PA and lateral chest X-ray is recommended in 3-4 weeks following trial of antibiotic therapy to ensure resolution and exclude underlying malignancy. Electronically Signed   By: Trudie Reed M.D.   On: 06/20/2021 09:00    Procedures Procedures   Medications Ordered in ED Medications  oxyCODONE-acetaminophen (PERCOCET/ROXICET) 5-325 MG per tablet 1 tablet (has no administration in time range)  lactated ringers bolus 1,000 mL (0 mLs Intravenous Stopped 06/20/21 0951)  oxyCODONE-acetaminophen (PERCOCET/ROXICET) 5-325 MG per tablet 1 tablet (1 tablet Oral Given 06/20/21 0812)  doxycycline (VIBRA-TABS) tablet 100 mg (100 mg Oral Given 06/20/21 1112)    ED Course  I have reviewed the triage vital signs and the nursing notes.  Pertinent labs & imaging results that were available during my care of the patient were reviewed by me and considered in my  medical decision making (see chart for details).    MDM Rules/Calculators/A&P                          Patient is a 60 year old male presenting today with multiple complaints.  Patient's vital signs are reassuring.  However he does have multiple medical problems.  He is complaining of URI symptoms.  Unclear if Toradol shot has anything to do with this but he does have prior history of gastric ulcer status post surgeries and concern with his blood in stool that it could have been exacerbated by the injection.  He has dry mouth but no findings concerning for thrush.  No posterior pharyngeal edema or concerns with stridor.  He has no localized  abdominal tenderness at this time and no findings to suggest obstruction.  He did have a CT scan done 2 days ago which showed chronic surgical adhesions as well as possible ileus or enteritis but again on exam he does not complain of nausea or vomiting.  He is not complaining of abdominal pain.  Labs are reassuring with normal CBC, negative Hemoccult, unchanged renal function and normal UA other than glucose in the urine.  Chest x-ray with concern for developing bronchopneumonia and given his chest discomfort, productive cough and some intermittent shortness of breath will treat for infectious etiology.  His troponin is negative and EKG without acute findings.  Low suspicion for ACS at this time.  Findings discussed with the patient and his wife.  He was discharged home with doxycycline as he has an allergy to any type of penicillins.  He has had doxycycline in the past and tolerated it well.  MDM   Amount and/or Complexity of Data Reviewed Clinical lab tests: ordered and reviewed Tests in the radiology section of CPT: ordered and reviewed Tests in the medicine section of CPT: ordered and reviewed Independent visualization of images, tracings, or specimens: yes      Final Clinical Impression(s) / ED Diagnoses Final diagnoses:  Community acquired pneumonia, unspecified laterality    Rx / DC Orders ED Discharge Orders     None        Gwyneth Sprout, MD 06/20/21 1123

## 2021-06-20 NOTE — Discharge Instructions (Addendum)
Today there was no blood in your stool and your blood counts looked normal.  Your x-ray did show findings concerning for possible an early pneumonia which is why you were given the antibiotic.  Kidneys otherwise look okay and no signs of heart problems.  If you do have run out of your medication you need to call your regular doctor who prescribes that to you and let them know so they can get you a refill.

## 2021-06-20 NOTE — ED Notes (Signed)
Pt d/c home per MD order. Discharge summary reviewed with pt, pt verbalizes understanding. Pt visitor is discharge ride home. Ambulatory off unit . No s/s of acute distress noted.

## 2021-07-04 ENCOUNTER — Other Ambulatory Visit: Payer: Self-pay | Admitting: Nephrology

## 2021-07-04 DIAGNOSIS — N182 Chronic kidney disease, stage 2 (mild): Secondary | ICD-10-CM

## 2021-07-10 ENCOUNTER — Other Ambulatory Visit: Payer: Medicare (Managed Care)

## 2021-07-16 ENCOUNTER — Other Ambulatory Visit: Payer: Medicare (Managed Care)

## 2021-07-23 ENCOUNTER — Ambulatory Visit
Admission: RE | Admit: 2021-07-23 | Discharge: 2021-07-23 | Disposition: A | Discharge status: Home / Self Care | Payer: 59 | Source: Ambulatory Visit | Attending: Nephrology | Admitting: Nephrology

## 2021-07-23 DIAGNOSIS — N182 Chronic kidney disease, stage 2 (mild): Secondary | ICD-10-CM

## 2022-03-18 ENCOUNTER — Other Ambulatory Visit: Payer: Self-pay

## 2022-03-18 ENCOUNTER — Emergency Department (HOSPITAL_COMMUNITY): Payer: Medicare (Managed Care)

## 2022-03-18 ENCOUNTER — Emergency Department (HOSPITAL_COMMUNITY)
Admission: EM | Admit: 2022-03-18 | Discharge: 2022-03-18 | Disposition: A | Discharge status: Home / Self Care | Payer: Medicare (Managed Care) | Attending: Emergency Medicine | Admitting: Emergency Medicine

## 2022-03-18 ENCOUNTER — Encounter (HOSPITAL_COMMUNITY): Payer: Self-pay

## 2022-03-18 DIAGNOSIS — R569 Unspecified convulsions: Secondary | ICD-10-CM | POA: Insufficient documentation

## 2022-03-18 DIAGNOSIS — R251 Tremor, unspecified: Secondary | ICD-10-CM | POA: Insufficient documentation

## 2022-03-18 DIAGNOSIS — R7989 Other specified abnormal findings of blood chemistry: Secondary | ICD-10-CM | POA: Insufficient documentation

## 2022-03-18 DIAGNOSIS — R4182 Altered mental status, unspecified: Secondary | ICD-10-CM | POA: Diagnosis not present

## 2022-03-18 DIAGNOSIS — Z7901 Long term (current) use of anticoagulants: Secondary | ICD-10-CM | POA: Insufficient documentation

## 2022-03-18 DIAGNOSIS — I10 Essential (primary) hypertension: Secondary | ICD-10-CM | POA: Insufficient documentation

## 2022-03-18 DIAGNOSIS — E1165 Type 2 diabetes mellitus with hyperglycemia: Secondary | ICD-10-CM | POA: Insufficient documentation

## 2022-03-18 DIAGNOSIS — R400 Somnolence: Secondary | ICD-10-CM | POA: Insufficient documentation

## 2022-03-18 DIAGNOSIS — F445 Conversion disorder with seizures or convulsions: Secondary | ICD-10-CM | POA: Diagnosis not present

## 2022-03-18 DIAGNOSIS — R410 Disorientation, unspecified: Secondary | ICD-10-CM | POA: Diagnosis not present

## 2022-03-18 LAB — MAGNESIUM: Magnesium: 1.6 mg/dL — ABNORMAL LOW (ref 1.7–2.4)

## 2022-03-18 LAB — CBC WITH DIFFERENTIAL/PLATELET
Abs Immature Granulocytes: 0.03 10*3/uL (ref 0.00–0.07)
Basophils Absolute: 0 10*3/uL (ref 0.0–0.1)
Basophils Relative: 1 %
Eosinophils Absolute: 0.1 10*3/uL (ref 0.0–0.5)
Eosinophils Relative: 1 %
HCT: 36.9 % — ABNORMAL LOW (ref 39.0–52.0)
Hemoglobin: 11.7 g/dL — ABNORMAL LOW (ref 13.0–17.0)
Immature Granulocytes: 0 %
Lymphocytes Relative: 19 %
Lymphs Abs: 1.7 10*3/uL (ref 0.7–4.0)
MCH: 27.6 pg (ref 26.0–34.0)
MCHC: 31.7 g/dL (ref 30.0–36.0)
MCV: 87 fL (ref 80.0–100.0)
Monocytes Absolute: 0.6 10*3/uL (ref 0.1–1.0)
Monocytes Relative: 7 %
Neutro Abs: 6.2 10*3/uL (ref 1.7–7.7)
Neutrophils Relative %: 72 %
Platelets: 175 10*3/uL (ref 150–400)
RBC: 4.24 MIL/uL (ref 4.22–5.81)
RDW: 15 % (ref 11.5–15.5)
WBC: 8.6 10*3/uL (ref 4.0–10.5)
nRBC: 0 % (ref 0.0–0.2)

## 2022-03-18 LAB — COMPREHENSIVE METABOLIC PANEL
ALT: 27 U/L (ref 0–44)
AST: 32 U/L (ref 15–41)
Albumin: 3.8 g/dL (ref 3.5–5.0)
Alkaline Phosphatase: 53 U/L (ref 38–126)
Anion gap: 7 (ref 5–15)
BUN: 16 mg/dL (ref 6–20)
CO2: 22 mmol/L (ref 22–32)
Calcium: 9 mg/dL (ref 8.9–10.3)
Chloride: 111 mmol/L (ref 98–111)
Creatinine, Ser: 1.42 mg/dL — ABNORMAL HIGH (ref 0.61–1.24)
GFR, Estimated: 57 mL/min — ABNORMAL LOW (ref >60)
Glucose, Bld: 147 mg/dL — ABNORMAL HIGH (ref 70–99)
Potassium: 4.4 mmol/L (ref 3.5–5.1)
Sodium: 140 mmol/L (ref 135–145)
Total Bilirubin: 0.7 mg/dL (ref 0.3–1.2)
Total Protein: 6.2 g/dL — ABNORMAL LOW (ref 6.5–8.1)

## 2022-03-18 LAB — CBG MONITORING, ED: Glucose-Capillary: 139 mg/dL — ABNORMAL HIGH (ref 70–99)

## 2022-03-18 MED ORDER — LEVETIRACETAM IN NACL 1500 MG/100ML IV SOLN
1500.0000 mg | Freq: Once | INTRAVENOUS | Status: AC
  Administered 2022-03-18: 1500 mg via INTRAVENOUS
  Filled 2022-03-18: qty 100

## 2022-03-18 MED ORDER — LORAZEPAM 2 MG/ML IJ SOLN
INTRAMUSCULAR | Status: AC
  Filled 2022-03-18: qty 1

## 2022-03-18 MED ORDER — MAGNESIUM SULFATE 2 GM/50ML IV SOLN
2.0000 g | Freq: Once | INTRAVENOUS | Status: AC
Start: 2022-03-18 — End: 2022-03-18
  Administered 2022-03-18: 2 g via INTRAVENOUS
  Filled 2022-03-18: qty 50

## 2022-03-18 MED ORDER — SODIUM CHLORIDE 0.9 % IV BOLUS
500.0000 mL | Freq: Once | INTRAVENOUS | Status: AC
  Administered 2022-03-18: 500 mL via INTRAVENOUS

## 2022-03-18 NOTE — Discharge Instructions (Signed)
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    Nonepileptic events (also called nonepilepsy seizures) are not caused by electrical activity in the brain. .    One in six people also have epilepsy seizures or has had them in the past. .    Nonepilepsy seizures may be associated with psychological conditions or other physical problems.  What are they? Events that look like seizures but are not due to epilepsy are called "nonepileptic seizures." Some people prefer to use the term "events" rather than seizures. You will see the terms used interchangeably here. A common type is described as psychogenic ([id number]), which means beginning in the mind. Psychogenic seizures or events are caused by subconsious thoughts, emotions or 'stress', not abnormal electrical activity in the brain. Doctors consider most of them psychological in nature, but not purposely produced. Usually the person is not aware that the spells are not "epileptic." The term "pseudoseizures" has also been used in the past to refer to these events, but we prefer to avoid this term as it is not accurate and has a negative meaning. It's important to know that some seizures that are not epilepsy could be caused by other physical problems. These are nonepilepsy seizures too, but not caused by a psychological condition. Further testing is needed to find the exact cause so they can be treated properly.  Are they common? Psychogenic nonepileptic events are common. About 20% of the patients referred to comprehensive epilepsy centers for video-EEG monitoring are found to have nonepileptic seizures. About 1 in 6 of these patients also has epileptic seizures or has had them in the past. Psychogenic nonepileptic events have been more widely recognized during the past several decades. They are most often seen in adolescents and young adults, but they also can occur in children and the elderly. They are three  times more common in females.  What do they look like? The events most often look like complex partial or tonic-clonic (grand mal) seizures. Family members report episodes in which the patient stiffens and jerks. Doctors rarely witness the actual event, so they are drawn toward the diagnosis of epilepsy. Often years can be spent trying to treat the spells as epileptic seizures without success.  How are they recognized? Certain kinds of movements and other patterns seem to be more common in psychogenic nonepileptic events than in seizures caused by epilepsy. Some of these patterns do occur occasionally in epileptic seizures, however, so having one of them does not necessarily mean that the seizure was nonepileptic. Video-EEG monitoring is the best way of diagnosing nonepileptic seizures. The doctor may take steps to provoke a seizure and then ask a family member or friend to confirm that the event was the same as the usual kind.  Can they be treated? The good news is that psychogenic nonepileptic events can respond to treatment. A psychiatric evaluation helps sort out possible psychological problems and the types of treatment that may be needed. Being diagnosed with psychogenic seizures doesn't necessarily mean that a person has a serious psychiatric disorder. Yet treating the nonepilepsy or psychogenic seizures will involve treating whatever psychological problems may be present. .    Sometimes the episodes stop when the person learns that they are psychological. Learning what the diagnsis means and what it doesn't mean is very important. .    Some people can learn how to control the events with behavioral techniques such as relaxation therapy or other forms of cognitive   behavioral therapy. .    Some people have depression or anxiety disorders that can be helped by medication. .    Counseling for a limited time is often helpful. Both individual and family therapy may be recommended. .    People who  have both epilepsy and psychogenic nonepilepsy seizures or events will require seizure medication as well as treatments for the psychogenic events. 

## 2022-03-18 NOTE — Procedures (Signed)
Patient Name: Nicholas [name]  ?MRN: [id number]  ?Epilepsy Attending: Lora Havens  ?Referring Physician/Provider: Donnetta Simpers, MD ?Duration: 03/18/2022 1009 to 1348 ?  ?Patient history:  61 y.o. male with PMH significant for hemiplegic migraine, obstructive sleep apnea, conversion disorder, PNES, diabetes type 2, hypertension, cervical myelopathy who presents today to the ED with concerns for seizures. EEG to evaluate for seizure ?  ?Level of alertness: Asleep ?  ?AEDs during EEG study: None ?  ?Technical aspects: This EEG study was done with scalp electrodes positioned according to the 10-20 International system of electrode placement. Electrical activity was acquired at a sampling rate of 500Hz  and reviewed with a high frequency filter of 70Hz  and a low frequency filter of 1Hz . EEG data were recorded continuously and digitally stored.  ?  ?Description: Sleep was characterized by vertex waves, sleep spindles (12 to 14 Hz), maximal frontocentral region. Hyperventilation and photic stimulation were not performed.    ? ?IMPRESSION: ?This study during sleep is within normal limits. No seizures or epileptiform discharges were seen throughout the recording. ?  ?Lora Havens  ?  ?

## 2022-03-18 NOTE — Progress Notes (Signed)
Discontinued cEEG study.  No skin breakdown observed. °

## 2022-03-18 NOTE — ED Triage Notes (Signed)
Pt arrived via [facility]. Pt had a seizure at work that lasted 3 mins. Pt had four more seizures with EMS that lasted 30-45 sec each time. Pt arrived being bagged. Pt is unresponsive. EMS gave midazolam 2.5mg . ?

## 2022-03-18 NOTE — Consult Note (Addendum)
NEUROLOGY CONSULTATION NOTE  ? ?Date of service: March 18, 2022 ?Patient Name: Nicholas [name] ?MRN:  [id number] ?DOB:  1961-04-14 ?Reason for consult: "Seizures vs PNES" ?Requesting Provider: Terrilee FilesButler, Michael C, MD ?_ _ _   _ __   _ __ _ _  __ __   _ __   __ _ ? ?History of Present Illness  ?Nicholas Leaverdward W Marciano is a 61 y.o. male with PMH significant for hemiplegic migraine, obstructive sleep apnea, conversion disorder, PNES, diabetes type 2, hypertension, cervical myelopathy who presents today to the ED with concerns for seizures. ? ?Patient reportedly had a 3-minute seizure at work.  EMS were called and he had additional for 30 to 45 seconds seizures with the EMS.  He was being bagged when he was brought into the ED.  EMS had given him about 2.5 mg of Versed. ? ?On my evaluation in the ED, he had a brief episode of bilateral upper extremity jerking but he was able to nod his head and tell me that he is going into a seizure. ? ?He reportedly sees neurology at [facility].  Most recent notes from [facility] on 11/21/2021 report the patient has diagnosed PNES and is on zonisamide 200mg  BID and Keppra 250 mg twice daily. ? ?He is encephalopathic but does not answer my question with one-word or short phrase, however with very limited participation with history and with exam.  I am unfortunately unable to get detailed history or detailed review of system due to this. ? ?He has been evaluated by our team in the past and cEEG has captured an event on 08/01/20. On 07/22/2020 at 1708 during which patient was noted to be vigorously shaking his head from side to side, opening closing his eyes followed by slapping himself which lasted for about 20 seconds.  Concomitant EEG before, during and after the event did not show any EEG change to suggest seizure. ?  ?ROS  ? ?Unable to obtain a detailed review of system due to encephalopathy. ? ?Past History  ? ?Past Medical History:  ?Diagnosis Date  ? Asthma   ? Bipolar 1 disorder (HCC)   ?  Borderline glaucoma   ? Chronic pain   ? Epididymal pain   ? LEFT  ? 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 NO NEUROLOGIST---  PT SEES PCP  DR Lindajo RoyalAVLOUT  ? Feeling of incomplete bladder emptying   ? Frequency of urination   ? Gastric ulcer   ? GERD (gastroesophageal reflux disease)   ? Hypertension   ? Hyperthyroidism   ? NO MEDS   ? Migraine   ? Seizures (HCC)   ? TIA (transient ischemic attack)   ? Type 2 diabetes mellitus (HCC)   ? ?Past Surgical History:  ?Procedure Laterality Date  ? ABDOMINAL SURGERY    ? ANTERIOR CERVICAL DECOMP/DISCECTOMY FUSION  2007  ? C4  --  C6  ? CERVICAL FUSION    ? CHOLECYSTECTOMY    ? COLONOSCOPY WITH PROPOFOL Left 11/02/2020  ? Procedure: COLONOSCOPY WITH PROPOFOL;  Surgeon: Willis Modenautlaw, William, MD;  Location[facility];  Service: Endoscopy;  Laterality: Left;  ? CYSTOSCOPY N/A 11/09/2013  ? Procedure: CYSTOSCOPY FLEXIBLE;  Surgeon: Bjorn PippinJohn Wrenn, MD;  [facility];  Service: Urology;  Laterality: N/A;  ? EPIDIDYMECTOMY Left 11/09/2013  ? Procedure:  LEFT EPIDIDYMECTOMY;  Surgeon: Bjorn PippinJohn Wrenn, MD;  [facility];  Service: Urology;  Laterality: Left;  POSSIBLE OUTPATIENT WITH OBSERVATION  ? EXCISION LIPOMA LEFT SHOULDER  2004 (APPROX)  ? MULTIPLE CYST REMOVED FROM CHEST  AGE 89  ? OTHER SURGICAL HISTORY    ? hemorroid surgery   ? TESTICLE REMOVAL Left   ? TONSILLECTOMY    ? ?Family History  ?Problem Relation Age of Onset  ? Heart failure Mother   ? Diabetes Mother   ? Hypertension Mother   ? Cirrhosis Father   ? Heart failure Father   ? Kidney disease Father   ? ?Social History  ? ?Socioeconomic History  ? Marital status: Widowed  ?  Spouse name: Not on file  ? Number of children: Not on file  ? Years of education: Not on file  ? Highest education level: Not on file  ?Occupational History  ? Not on file  ?Tobacco Use  ? Smoking status: Former  ?  Packs/day: 0.25  ?  Years: 15.00  ?  Pack years:  3.75  ?  Types: Cigarettes  ?  Quit date: 11/08/1992  ?  Years since quitting: 29.3  ? Smokeless tobacco: Never  ?Vaping Use  ? Vaping Use: Never used  ?Substance and Sexual Activity  ? Alcohol use: No  ? Drug use: No  ? Sexual activity: Not Currently  ?Other Topics Concern  ? Not on file  ?Social History Narrative  ? ** Merged History Encounter **  ?    ? ?Social Determinants of Health  ? ?Financial Resource Strain: Not on file  ?Food Insecurity: Not on file  ?Transportation Needs: Not on file  ?Physical Activity: Not on file  ?Stress: Not on file  ?Social Connections: Not on file  ? ?Allergies  ?Allergen Reactions  ? Finasteride Other (See Comments)  ?  Break out  ? Levothyroxine Anaphylaxis and Cough  ?  Chronic cough  ? Nsaids Other (See Comments)  ?  D/t gastric ulcer  ? Amoxicillin Itching and Other (See Comments)  ?  THRUSH ?Causes sores in mouth  ? Ampicillin Other (See Comments)  ?  THRUSH  ? Penicillins Itching and Other (See Comments)  ?  THRUSH- Causes sores in mouth  ? Strawberry Extract Swelling  ?  LIPS SWELL  ? Asa [Aspirin] Other (See Comments)  ?  Bleeding ?  ? Bactrim [Sulfamethoxazole-Trimethoprim] Hives, Itching and Other (See Comments)  ?  GI upset  ? Dapagliflozin   ?  Other reaction(s): Other (See Comments) ?Allergic to steroids and had mouth broke out.   ? Depakote [Divalproex Sodium]   ?  Causes double vision and speech problems   ? Dilantin [Phenytoin] Other (See Comments)  ?  Severe skin peeling  ? Methocarbamol Other (See Comments)  ?  dizziness  ? Other   ?  Med for prostate that startes with a M- Broke out the insid eof mouth, split the tongue, and caused throudh  ? Risperidone And Related Other (See Comments)  ?  Hallucinations ?  ? Sitagliptin Other (See Comments)  ?  pancreatitis  ? Strawberry (Diagnostic) Itching and Swelling  ? Sulfa Antibiotics Other (See Comments)  ?  Affects thyroid ?  ? Tolmetin Other (See Comments)  ?  D/t gastric ulcer  ? Ultram [Tramadol Hcl] Other (See  Comments)  ?  D/t gastric ulcer  ? Buprenorphine Itching, Rash and Other (See Comments)  ?  "Patches broke me out in red patches-  caused a fever, also"  ? Oatmeal Hives and Other (See Comments)  ?  White spots/sores in mouth  ? ? ?Medications  ?(Not in a hospital admission) ?  ? ?Vitals  ? ?Vitals:  ? 03/18/22 0900 03/18/22 0915 03/18/22 0930 03/18/22 0945  ?BP: 106/73 111/81 100/70 108/74  ?Pulse: 65 67 63 63  ?Resp: 11 14 17 16   ?Temp:      ?TempSrc:      ?SpO2: 98% 96% 97% 98%  ?Weight:      ?Height:      ?  ? ?Body mass index is 31.01 kg/m?. ? ?Physical Exam  ? ?General: Laying comfortably in bed; in no acute distress.  ?HENT: Normal oropharynx and mucosa. Normal external appearance of ears and nose.  ?Neck: Supple, no pain or tenderness  ?CV: No JVD. No peripheral edema.  ?Pulmonary: Symmetric Chest rise. Normal respiratory effort.  ?Abdomen: Soft to touch, non-tender.  ?Ext: No cyanosis, edema, or deformity  ?Skin: No rash. Normal palpation of skin.   ?Musculoskeletal: Normal digits and nails by inspection. No clubbing.  ? ?Neurologic Examination  ?Mental status/Cognition: Eyes partially open to voice.  He is oriented to self.  Does not answer any further orientation questions.  Unable to keep attention and dozes off without sustained word verbal or tactile stimuli. ?speech/language: Soft and whispery speech, mildly dysarthric.  Speech is nonfluent and responds with one-word or short phrase.  Comprehension is intact to simple commands.  Poor attention precludes assessment of repetition. ?Cranial nerves:  ? CN II Pupils equal and reactive to light, blinks to threat bilaterally.  ? CN III,IV,VI Looks left and right and makes eye contact, no gaze deviation or preference, no nystagmus.   ? CN V normal sensation in V1, V2, and V3 segments bilaterally  ? CN VII no asymmetry, no nasolabial fold flattening  ? CN VIII Turns head towards speech.  ? CN IX & X Protecting his airway. Spontaneous coughs  ? CN XI Head  midline.  ? CN XII midline tongue protrusion  ? ?Motor:  ?Muscle bulk: normal, tone normal. ?Somnolence precludes detailed assessment of strength but he moves all of his extremities spontaneously and purposefully.

## 2022-03-18 NOTE — ED Provider Notes (Signed)
?[name] EMERGENCY DEPARTMENT ?Provider Note ? ? ?CSN: [id number] ?Arrival date & time: 03/18/22  3329 ? ?  ? ?History ? ?Chief Complaint  ?Patient presents with  ? Seizures  ? ? ?Nicholas [name] is a 61 y.o. male.  He has a history of seizure disorder.  Reportedly had 4 seizures at work this morning.  EMS gave 2.5 mg of midazolam.  Tonic-clonic.  No return to normal mental status in between.  Level 5 caveat as further history not available at this time.  Altered mental status.  Concern patient is not protecting his airway ? ?The history is provided by the EMS personnel.  ?Seizures ?Seizure activity on arrival: yes   ?Seizure type:  Grand mal ?Episode characteristics: generalized shaking   ?Postictal symptoms: confusion and somnolence   ?Return to baseline: no   ?Number of seizures this episode:  4 ?Progression:  Partially resolved ?PTA treatment:  Midazolam ?History of seizures: yes   ? ?  ? ?Home Medications ?Prior to Admission medications   ?Medication Sig Start Date End Date Taking? Authorizing Provider  ?albuterol (PROVENTIL HFA;VENTOLIN HFA) 108 (90 Base) MCG/ACT inhaler Inhale 1-2 puffs into the lungs every 6 (six) hours as needed for wheezing or shortness of breath. 11/15/18   Rancour, Jeannett Senior, MD  ?atorvastatin (LIPITOR) 80 MG tablet Take 1 tablet (80 mg total) by mouth at bedtime. ?Patient taking differently: Take 80 mg by mouth daily. 09/05/20   Danford, Earl Lites, MD  ?budesonide-formoterol (SYMBICORT) 80-4.5 MCG/ACT inhaler Inhale 2 puffs into the lungs daily.    [provider]  ?clopidogrel (PLAVIX) 75 MG tablet Take 1 tablet (75 mg total) by mouth daily. 07/05/20   Azucena Fallen, MD  ?doxycycline (VIBRA-TABS) 100 MG tablet Take 1 tablet (100 mg total) by mouth 2 (two) times daily. 02/07/21   Lenn Sink, DPM  ?FARXIGA 10 MG TABS tablet Take 10 mg by mouth daily. 11/05/20   [provider]  ?fenofibrate (TRICOR) 145 MG tablet Take 1 tablet (145 mg total)  by mouth daily. 11/02/20   Swayze, Ava, DO  ?gabapentin (NEURONTIN) 400 MG capsule Take 2 capsules (800 mg total) by mouth at bedtime. 12/06/20   Swayze, Ava, DO  ?gabapentin (NEURONTIN) 600 MG tablet Take 1 tablet (600 mg total) by mouth daily at 6 (six) AM. 12/06/20   Swayze, Ava, DO  ?HUMALOG KWIKPEN 100 UNIT/ML KwikPen Inject 0-14 Units into the skin 4 (four) times daily as needed (high blood sugar). Inject into the skin up to 4 times a day, per sliding scale: BGL 160 or greater = 10 units; 200 or greater = 14 units 11/05/20   [provider]  ?latanoprost (XALATAN) 0.005 % ophthalmic solution Place 1 drop into both eyes at bedtime.  07/04/20   [provider]  ?levETIRAcetam (KEPPRA) 250 MG tablet Take 250 mg by mouth in the morning and at bedtime.    [provider]  ?metFORMIN (GLUCOPHAGE) 1000 MG tablet Take 500 mg by mouth 2 (two) times daily. 11/04/20   [provider]  ?metoCLOPramide (REGLAN) 10 MG tablet Take 10 mg by mouth 4 (four) times daily.    [provider]  ?nortriptyline (PAMELOR) 50 MG capsule Take 100 mg by mouth at bedtime.     [provider]  ?oxyCODONE-acetaminophen (PERCOCET) 10-325 MG tablet Take 1.5 tablets by mouth every 6 (six) hours as needed for pain. 10/24/20   [provider]  ?oxyCODONE-acetaminophen (PERCOCET) 10-325 MG tablet Take 1  tablet by mouth every 4 (four) hours as needed for pain. 02/07/21   Lenn Sinkegal, Norman S, DPM  ?oxyCODONE-acetaminophen (PERCOCET) 5-325 MG tablet Take 1-2 tablets by mouth every 4 (four) hours as needed for severe pain. 01/24/21   Candelaria StagersPatel, Kevin P, DPM  ?pantoprazole (PROTONIX) 40 MG tablet Take 40 mg by mouth daily. 07/03/20   [provider]  ?polyethylene glycol powder (GLYCOLAX/MIRALAX) 17 GM/SCOOP powder Take 17 g by mouth daily as needed for mild constipation. 12/31/15   [provider]  ?sertraline (ZOLOFT) 50 MG tablet Take 50 mg by mouth daily. 06/13/20   [provider]  ?SUMAtriptan (IMITREX) 100 MG tablet Take 1 tablet (100 mg total) by mouth daily as needed for migraine or headache. May repeat in 2 hours if headache persists or recurs. 07/25/20   Leatha GildingGherghe, Costin M, MD  ?tamsulosin (FLOMAX) 0.4 MG CAPS capsule Take 0.4 mg by mouth in the morning and at bedtime.    [provider]  ?timolol (TIMOPTIC) 0.5 % ophthalmic solution Place 1 drop into both eyes 2 (two) times daily.  04/06/20   [provider]  ?zonisamide (ZONEGRAN) 50 MG capsule Take 100 mg by mouth at bedtime.    [provider]  ?   ? ?Allergies    ?Finasteride, Levothyroxine, Nsaids, Amoxicillin, Ampicillin, Penicillins, Strawberry extract, Asa [aspirin], Bactrim [sulfamethoxazole-trimethoprim], Dapagliflozin, Depakote [divalproex sodium], Dilantin [phenytoin], Methocarbamol, Other, Risperidone and related, Sitagliptin, Strawberry (diagnostic), Sulfa antibiotics, Tolmetin, Ultram [tramadol hcl], Buprenorphine, and Oatmeal   ? ?Review of Systems   ?Review of Systems  ?Unable to perform ROS: Mental status change  ?Neurological:  Positive for seizures.  ? ?Physical Exam ?Updated Vital Signs ?BP 111/72   Pulse 75   Temp 98.2 ?F (36.8 ?C) (Axillary)   Resp 13   Ht 5\' 7"  (1.702 m)   Wt 89.8 kg   SpO2 96%   BMI 31.01 kg/m?  ?Physical Exam ?Vitals and nursing note reviewed.  ?Constitutional:   ?   [name]: He is not in acute distress. ?   Appearance: Normal appearance. He is well-developed.  ?HENT:  ?   Head: Normocephalic and atraumatic.  ?Eyes:  ?   Conjunctiva/sclera: Conjunctivae normal.  ?Cardiovascular:  ?   Rate and Rhythm: Normal rate and regular rhythm.  ?   Heart sounds: No murmur heard. ?Pulmonary:  ?   Effort: Pulmonary effort is normal. No respiratory distress.  ?   Breath sounds: Normal breath sounds.  ?Abdominal:  ?   Palpations: Abdomen is soft.  ?   Tenderness: There is no abdominal tenderness. There is no guarding or rebound.  ?Musculoskeletal:     ?   [name]:  No swelling. Normal range of motion.  ?   Cervical back: Neck supple.  ?Skin: ?   [name]: Skin is warm and dry.  ?   Capillary Refill: Capillary refill takes less than 2 seconds.  ?Neurological:  ?   [name]: No focal deficit present.  ?   Comments: Patient is awake and speaking although speech is somewhat slow and difficult to understand.  No focal deficits.  Patient had 1 episode of tonic-clonic seizures during exam generalized shaking,.  Patient quickly was able to respond afterwards saying that he is not on Keppra.  Question pseudoseizure.  ? ? ?ED Results / Procedures / Treatments   ?Labs ?(all labs ordered are listed, but only abnormal results are displayed) ?Labs Reviewed  ?COMPREHENSIVE METABOLIC PANEL - Abnormal; Notable for the following components:  ?  Result Value  ? Glucose, Bld 147 (*)   ? Creatinine, Ser 1.42 (*)   ? Total Protein 6.2 (*)   ? GFR, Estimated 57 (*)   ? All other components within normal limits  ?CBC WITH DIFFERENTIAL/PLATELET - Abnormal; Notable for the following components:  ? Hemoglobin 11.7 (*)   ? HCT 36.9 (*)   ? All other components within normal limits  ?MAGNESIUM - Abnormal; Notable for the following components:  ? Magnesium 1.6 (*)   ? All other components within normal limits  ?CBG MONITORING, ED - Abnormal; Notable for the following components:  ? Glucose-Capillary 139 (*)   ? All other components within normal limits  ? ? ?EKG ?EKG Interpretation ? ?Date/Time:  Wednesday March 18 2022 07:42:49 EDT ?Ventricular Rate:  75 ?PR Interval:  188 ?QRS Duration: 86 ?QT Interval:  385 ?QTC Calculation: 430 ?R Axis:   -32 ?Text Interpretation: Sinus or ectopic atrial rhythm Left axis deviation Borderline T abnormalities, inferior leads No significant change since prior 7/22 Confirmed by Meridee Score [phone]) on 03/18/2022 7:55:20 AM ? ?Radiology ?EEG adult ? ?Result Date: 03/18/2022 ?Charlsie Quest, MD     03/18/2022 10:31 AM Patient Name: QUINTERIUS GAIDA MRN: [id number] Epilepsy  Attending: Charlsie Quest Referring Physician/Provider: Erick Blinks, MD Date: 03/18/2022 Duration: 23.01 mins Patient history:  61 y.o. male with PMH significant for hemiplegic migraine, obstructive sleep ap

## 2022-03-18 NOTE — Procedures (Signed)
Patient Name: Nicholas [name]  ?MRN: [id number]  ?Epilepsy Attending: Charlsie Quest  ?Referring Physician/Provider: Erick Blinks, MD ?Date: 03/18/2022 ?Duration: 23.01 mins ? ?Patient history:  61 y.o. male with PMH significant for hemiplegic migraine, obstructive sleep apnea, conversion disorder, PNES, diabetes type 2, hypertension, cervical myelopathy who presents today to the ED with concerns for seizures. EEG to evaluate for seizure ? ?Level of alertness: Awake ? ?AEDs during EEG study:  ? ?Technical aspects: This EEG study was done with scalp electrodes positioned according to the 10-20 International system of electrode placement. Electrical activity was acquired at a sampling rate of 500Hz  and reviewed with a high frequency filter of 70Hz  and a low frequency filter of 1Hz . EEG data were recorded continuously and digitally stored.  ? ?Description: The posterior dominant rhythm consists of 8-9 Hz activity of moderate voltage (25-35 uV) seen predominantly in posterior head regions, symmetric and reactive to eye opening and eye closing. EEG showed intermittent generalized 3 to 6 Hz theta-delta slowing. Hyperventilation and photic stimulation were not performed.    ? ?ABNORMALITY ?- Intermittent slow, generalized ? ?IMPRESSION: ?This study is suggestive of mild diffuse encephalopathy, nonspecific etiology. No seizures or epileptiform discharges were seen throughout the recording. ? ?  ? ?

## 2022-03-18 NOTE — Progress Notes (Signed)
Streted cEEG study in  ED.  Tested patient event button. ?

## 2022-03-18 NOTE — Progress Notes (Signed)
EEG complete - results pending 

## 2022-03-18 NOTE — ED Notes (Signed)
Patient verbalizes understanding of discharge instructions. Opportunity for questioning and answers were provided. Armband removed by staff, pt discharged from ED.  

## 2022-04-04 ENCOUNTER — Other Ambulatory Visit: Payer: Self-pay

## 2022-04-04 ENCOUNTER — Emergency Department (HOSPITAL_COMMUNITY): Payer: Medicare (Managed Care)

## 2022-04-04 ENCOUNTER — Encounter (HOSPITAL_COMMUNITY): Payer: Self-pay | Admitting: Emergency Medicine

## 2022-04-04 ENCOUNTER — Emergency Department (HOSPITAL_COMMUNITY)
Admission: EM | Admit: 2022-04-04 | Discharge: 2022-04-04 | Disposition: A | Discharge status: Home / Self Care | Payer: Medicare (Managed Care) | Attending: Emergency Medicine | Admitting: Emergency Medicine

## 2022-04-04 DIAGNOSIS — S63681A Other sprain of right thumb, initial encounter: Secondary | ICD-10-CM | POA: Diagnosis not present

## 2022-04-04 DIAGNOSIS — Y92838 Other recreation area as the place of occurrence of the external cause: Secondary | ICD-10-CM | POA: Insufficient documentation

## 2022-04-04 DIAGNOSIS — Z7902 Long term (current) use of antithrombotics/antiplatelets: Secondary | ICD-10-CM | POA: Insufficient documentation

## 2022-04-04 DIAGNOSIS — M79644 Pain in right finger(s): Secondary | ICD-10-CM | POA: Diagnosis not present

## 2022-04-04 DIAGNOSIS — W500XXA Accidental hit or strike by another person, initial encounter: Secondary | ICD-10-CM | POA: Diagnosis not present

## 2022-04-04 DIAGNOSIS — Z794 Long term (current) use of insulin: Secondary | ICD-10-CM | POA: Insufficient documentation

## 2022-04-04 DIAGNOSIS — S60931A Unspecified superficial injury of right thumb, initial encounter: Secondary | ICD-10-CM | POA: Diagnosis present

## 2022-04-04 MED ORDER — OXYCODONE-ACETAMINOPHEN 5-325 MG PO TABS
1.0000 | ORAL_TABLET | Freq: Once | ORAL | Status: AC
  Administered 2022-04-04: 1 via ORAL
  Filled 2022-04-04: qty 1
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
## 2022-04-04 NOTE — Discharge Instructions (Addendum)
Schedule to see the Hand surgeon for evaltuion next week.   ?

## 2022-04-26 ENCOUNTER — Other Ambulatory Visit: Payer: Self-pay

## 2022-04-26 ENCOUNTER — Encounter (HOSPITAL_COMMUNITY): Payer: Self-pay | Admitting: Emergency Medicine

## 2022-04-26 ENCOUNTER — Emergency Department (HOSPITAL_COMMUNITY): Payer: Medicare Other

## 2022-04-26 ENCOUNTER — Emergency Department (HOSPITAL_COMMUNITY)
Admission: EM | Admit: 2022-04-26 | Discharge: 2022-04-26 | Disposition: A | Discharge status: Home / Self Care | Payer: Medicare Other | Attending: Emergency Medicine | Admitting: Emergency Medicine

## 2022-04-26 DIAGNOSIS — R1084 Generalized abdominal pain: Secondary | ICD-10-CM | POA: Diagnosis not present

## 2022-04-26 DIAGNOSIS — K5900 Constipation, unspecified: Secondary | ICD-10-CM | POA: Diagnosis not present

## 2022-04-26 DIAGNOSIS — Z79899 Other long term (current) drug therapy: Secondary | ICD-10-CM | POA: Insufficient documentation

## 2022-04-26 DIAGNOSIS — R109 Unspecified abdominal pain: Secondary | ICD-10-CM | POA: Diagnosis present

## 2022-04-26 LAB — URINALYSIS, ROUTINE W REFLEX MICROSCOPIC
Bilirubin Urine: NEGATIVE
Glucose, UA: 50 mg/dL — AB
Hgb urine dipstick: NEGATIVE
Ketones, ur: NEGATIVE mg/dL
Leukocytes,Ua: NEGATIVE
Nitrite: NEGATIVE
Protein, ur: NEGATIVE mg/dL
Specific Gravity, Urine: 1.009 (ref 1.005–1.030)
pH: 7 (ref 5.0–8.0)

## 2022-04-26 LAB — COMPREHENSIVE METABOLIC PANEL
ALT: 55 U/L — ABNORMAL HIGH (ref 0–44)
AST: 50 U/L — ABNORMAL HIGH (ref 15–41)
Albumin: 3.9 g/dL (ref 3.5–5.0)
Alkaline Phosphatase: 51 U/L (ref 38–126)
Anion gap: 5 (ref 5–15)
BUN: 11 mg/dL (ref 6–20)
CO2: 24 mmol/L (ref 22–32)
Calcium: 9.8 mg/dL (ref 8.9–10.3)
Chloride: 110 mmol/L (ref 98–111)
Creatinine, Ser: 1.38 mg/dL — ABNORMAL HIGH (ref 0.61–1.24)
GFR, Estimated: 59 mL/min — ABNORMAL LOW (ref >60)
Glucose, Bld: 158 mg/dL — ABNORMAL HIGH (ref 70–99)
Potassium: 3.9 mmol/L (ref 3.5–5.1)
Sodium: 139 mmol/L (ref 135–145)
Total Bilirubin: 0.7 mg/dL (ref 0.3–1.2)
Total Protein: 6.5 g/dL (ref 6.5–8.1)

## 2022-04-26 LAB — CBC
HCT: 46.2 % (ref 39.0–52.0)
Hemoglobin: 15 g/dL (ref 13.0–17.0)
MCH: 27.6 pg (ref 26.0–34.0)
MCHC: 32.5 g/dL (ref 30.0–36.0)
MCV: 84.9 fL (ref 80.0–100.0)
Platelets: 194 10*3/uL (ref 150–400)
RBC: 5.44 MIL/uL (ref 4.22–5.81)
RDW: 15.4 % (ref 11.5–15.5)
WBC: 6.1 10*3/uL (ref 4.0–10.5)
nRBC: 0 % (ref 0.0–0.2)

## 2022-04-26 LAB — LIPASE, BLOOD: Lipase: 23 U/L (ref 11–51)

## 2022-04-26 MED ORDER — DICYCLOMINE HCL 10 MG PO CAPS
10.0000 mg | ORAL_CAPSULE | Freq: Once | ORAL | Status: AC
  Administered 2022-04-26: 10 mg via ORAL
  Filled 2022-04-26: qty 1

## 2022-04-26 MED ORDER — DICYCLOMINE HCL 20 MG PO TABS: 20.0000 mg | ORAL_TABLET | Freq: Two times a day (BID) | ORAL | 0 refills | Status: DC

## 2022-04-26 MED ORDER — IOHEXOL 300 MG/ML  SOLN
100.0000 mL | Freq: Once | INTRAMUSCULAR | Status: AC | PRN
  Administered 2022-04-26: 100 mL via INTRAVENOUS

## 2022-04-26 NOTE — Discharge Instructions (Addendum)
Return for any problem.  ?

## 2022-04-26 NOTE — ED Triage Notes (Signed)
Pt reports constipation x 1 week.  States he had large intestines removed in 2021 and had abd surgery again in December after having constipation.  Reports generalized abd pain with abd distention.

## 2022-04-26 NOTE — ED Provider Notes (Signed)
[facility] EMERGENCY DEPARTMENT Provider Note   CSN: [id number] Arrival date & time: 04/26/22  1439     History  Chief Complaint  Patient presents with   Constipation   Abdominal Pain    Nicholas [name] is a 61 y.o. male.  61 year old male with prior medical history as detailed below presents for evaluation.  Patient reports that in 2021 he had his large intestine removed.  Patient reports episode of obstruction in December 2021.  He required surgery for this at [facility].  Patient reports that over the last week he has been having some abdominal bloating.  He is still passing flatus.  He denies any nausea or vomiting.  He complains of some generalized abdominal cramping as well.  He feels that he may be constipated.  He is concerned that he may have a repeat obstruction.  The history is provided by the patient and medical records.  Constipation Severity:  Mild Time since last bowel movement:  1 week Timing:  Constant Progression:  Waxing and waning Chronicity:  New Associated symptoms: abdominal pain   Abdominal Pain Associated symptoms: constipation       Home Medications Prior to Admission medications   Medication Sig Start Date End Date Taking? Authorizing Provider  dicyclomine (BENTYL) 20 MG tablet Take 1 tablet (20 mg total) by mouth 2 (two) times daily. 04/26/22  Yes Wynetta Fines, MD  albuterol (PROVENTIL HFA;VENTOLIN HFA) 108 (90 Base) MCG/ACT inhaler Inhale 1-2 puffs into the lungs every 6 (six) hours as needed for wheezing or shortness of breath. 11/15/18   Rancour, Jeannett Senior, MD  atorvastatin (LIPITOR) 80 MG tablet Take 1 tablet (80 mg total) by mouth at bedtime. Patient taking differently: Take 80 mg by mouth daily. 09/05/20   Danford, Earl Lites, MD  budesonide-formoterol (SYMBICORT) 80-4.5 MCG/ACT inhaler Inhale 2 puffs into the lungs daily.    [provider]  clopidogrel (PLAVIX) 75 MG tablet Take 1 tablet (75 mg total) by mouth  daily. 07/05/20   Azucena Fallen, MD  doxycycline (VIBRA-TABS) 100 MG tablet Take 1 tablet (100 mg total) by mouth 2 (two) times daily. 02/07/21   Regal, Kirstie Peri, DPM  FARXIGA 10 MG TABS tablet Take 10 mg by mouth daily. 11/05/20   [provider]  fenofibrate (TRICOR) 145 MG tablet Take 1 tablet (145 mg total) by mouth daily. 11/02/20   Swayze, Ava, DO  gabapentin (NEURONTIN) 400 MG capsule Take 2 capsules (800 mg total) by mouth at bedtime. 12/06/20   Swayze, Ava, DO  gabapentin (NEURONTIN) 600 MG tablet Take 1 tablet (600 mg total) by mouth daily at 6 (six) AM. 12/06/20   Swayze, Ava, DO  HUMALOG KWIKPEN 100 UNIT/ML KwikPen Inject 0-14 Units into the skin 4 (four) times daily as needed (high blood sugar). Inject into the skin up to 4 times a day, per sliding scale: BGL 160 or greater = 10 units; 200 or greater = 14 units 11/05/20   [provider]  latanoprost (XALATAN) 0.005 % ophthalmic solution Place 1 drop into both eyes at bedtime.  07/04/20   [provider]  levETIRAcetam (KEPPRA) 250 MG tablet Take 250 mg by mouth in the morning and at bedtime.    [provider]  metFORMIN (GLUCOPHAGE) 1000 MG tablet Take 500 mg by mouth 2 (two) times daily. 11/04/20   [provider]  metoCLOPramide (REGLAN) 10 MG tablet Take 10 mg by mouth 4 (four) times daily.    [provider]  nortriptyline (PAMELOR) 50 MG capsule Take 100 mg by mouth at bedtime.     [provider]  oxyCODONE-acetaminophen (PERCOCET) 10-325 MG tablet Take 1.5 tablets by mouth every 6 (six) hours as needed for pain. 10/24/20   [provider]  oxyCODONE-acetaminophen (PERCOCET) 10-325 MG tablet Take 1 tablet by mouth every 4 (four) hours as needed for pain. 02/07/21   Lenn Sink, DPM  oxyCODONE-acetaminophen (PERCOCET) 5-325 MG tablet Take 1-2 tablets by mouth every 4 (four) hours as needed for severe pain. 01/24/21   Candelaria Stagers, DPM  pantoprazole  (PROTONIX) 40 MG tablet Take 40 mg by mouth daily. 07/03/20   [provider]  polyethylene glycol powder (GLYCOLAX/MIRALAX) 17 GM/SCOOP powder Take 17 g by mouth daily as needed for mild constipation. 12/31/15   [provider]  sertraline (ZOLOFT) 50 MG tablet Take 50 mg by mouth daily. 06/13/20   [provider]  SUMAtriptan (IMITREX) 100 MG tablet Take 1 tablet (100 mg total) by mouth daily as needed for migraine or headache. May repeat in 2 hours if headache persists or recurs. 07/25/20   Leatha Gilding, MD  tamsulosin (FLOMAX) 0.4 MG CAPS capsule Take 0.4 mg by mouth in the morning and at bedtime.    [provider]  timolol (TIMOPTIC) 0.5 % ophthalmic solution Place 1 drop into both eyes 2 (two) times daily.  04/06/20   [provider]  zonisamide (ZONEGRAN) 50 MG capsule Take 100 mg by mouth at bedtime.    [provider]      Allergies    Finasteride, Levothyroxine, Nsaids, Amoxicillin, Ampicillin, Penicillins, Strawberry extract, Asa [aspirin], Bactrim [sulfamethoxazole-trimethoprim], Dapagliflozin, Depakote [divalproex sodium], Dilantin [phenytoin], Methocarbamol, Other, Risperidone and related, Sitagliptin, Strawberry (diagnostic), Sulfa antibiotics, Tolmetin, Ultram [tramadol hcl], Buprenorphine, and Oatmeal    Review of Systems   Review of Systems  Gastrointestinal:  Positive for abdominal pain and constipation.  All other systems reviewed and are negative.  Physical Exam Updated Vital Signs BP (!) 148/125   Pulse 85   Temp 97.6 F (36.4 C)   Resp 17   SpO2 97%  Physical Exam Vitals and nursing note reviewed.  Constitutional:      General: He is not in acute distress.    Appearance: Normal appearance. He is well-developed.  HENT:     Head: Normocephalic and atraumatic.  Eyes:     Conjunctiva/sclera: Conjunctivae normal.     Pupils: Pupils are equal, round, and reactive to light.  Cardiovascular:     Rate and Rhythm:  Normal rate and regular rhythm.     Heart sounds: Normal heart sounds.  Pulmonary:     Effort: Pulmonary effort is normal. No respiratory distress.     Breath sounds: Normal breath sounds.  Abdominal:     General: There is no distension.     Palpations: Abdomen is soft.     Tenderness: There is no abdominal tenderness.     Comments: Mild distention.  No significant discrete abdominal tenderness with palpation.  Musculoskeletal:        General: No deformity. Normal range of motion.     Cervical back: Normal range of motion and neck supple.  Skin:    General: Skin is warm and dry.  Neurological:     General: No focal deficit present.     Mental Status: He is alert and oriented to person, place, and time.    ED Results / Procedures / Treatments   Labs (all  labs ordered are listed, but only abnormal results are displayed) Labs Reviewed  COMPREHENSIVE METABOLIC PANEL - Abnormal; Notable for the following components:      Result Value   Glucose, Bld 158 (*)    Creatinine, Ser 1.38 (*)    AST 50 (*)    ALT 55 (*)    GFR, Estimated 59 (*)    All other components within normal limits  URINALYSIS, ROUTINE W REFLEX MICROSCOPIC - Abnormal; Notable for the following components:   Color, Urine STRAW (*)    Glucose, UA 50 (*)    All other components within normal limits  LIPASE, BLOOD  CBC    EKG None  Radiology CT ABDOMEN PELVIS W CONTRAST  Result Date: 04/26/2022 CLINICAL DATA:  Abdominal pain EXAM: CT ABDOMEN AND PELVIS WITH CONTRAST TECHNIQUE: Multidetector CT imaging of the abdomen and pelvis was performed using the standard protocol following bolus administration of intravenous contrast. RADIATION DOSE REDUCTION: This exam was performed according to the departmental dose-optimization program which includes automated exposure control, adjustment of the mA and/or kV according to patient size and/or use of iterative reconstruction technique. CONTRAST:  OMNIPAQUE IOHEXOL 300  MG/ML  SOLN COMPARISON:  03/03/2021 FINDINGS: Lower chest: Unremarkable. Hepatobiliary: No focal abnormality is seen in the liver. There is no significant dilation of bile ducts. Gallbladder is not seen. Pancreas: No focal abnormality is seen. Spleen: Unremarkable. Adrenals/Urinary Tract: There is mild hyperplasia of left adrenal with no significant change. There is no hydronephrosis. There are no renal or ureteral stones. Urinary bladder is not distended. Stomach/Bowel: Surgical staples are seen in the stomach. Stomach is not distended. Small bowel loops are not dilated. Surgical staples are seen in the jejunum. Appendix is not dilated. There is no significant wall thickening in colon. Few diverticula are seen in the left colon without signs of focal acute diverticulitis. Vascular/Lymphatic: Unremarkable. Reproductive: Unremarkable. Other: There is no ascites or pneumoperitoneum. Linear densities seen in the subcutaneous plane in the anterior abdominal wall may be residual from previous surgery. Musculoskeletal: There is encroachment of neural foramina at L4-L5 and L5-S1 levels. IMPRESSION: There is no evidence of intestinal obstruction or pneumoperitoneum. There is no hydronephrosis. Appendix is not dilated. Diverticulosis of colon without signs of focal diverticulitis. Lumbar spondylosis. Other findings as described in the body of the report. Electronically Signed   By: Ernie Avena M.D.   On: 04/26/2022 16:38    Procedures Procedures    Medications Ordered in ED Medications  dicyclomine (BENTYL) capsule 10 mg (has no administration in time range)  iohexol (OMNIPAQUE) 300 MG/ML solution 100 mL (100 mLs Intravenous Contrast Given 04/26/22 1609)    ED Course/ Medical Decision Making/ A&P                           Medical Decision Making Amount and/or Complexity of Data Reviewed Labs: ordered. Radiology: ordered.  Risk Prescription drug management.    Medical Screen Complete  This  patient presented to the ED with complaint of abdominal distention, constipation.  This complaint involves an extensive number of treatment options. The initial differential diagnosis includes, but is not limited to, obstruction, intra-abdominal pathology, etc.  This presentation is: Acute, Chronic, Self-Limited, Previously Undiagnosed, Uncertain Prognosis, Complicated, Systemic Symptoms, and Threat to Life/Bodily Function  Patient with prior surgical history of colectomy and prior obstruction presents with complaint of decreased bowel movements.  Patient complains of mild abdominal distention.  Patient is still passing flatus.  Patient without nausea or vomiting.  Patient without fever.  Screening labs obtained are without significant abnormality.   CT imaging is without evidence obstruction or other significant acute pathology.  Patient feels significantly improved after ED evaluation.  Patient desires discharge home.  Patient is established with both GI surgery and [facility].  Patient is advised to closely follow-up with same.  Importance of close follow-up is stressed.  Strict return precautions given understood.  Additional history obtained:  External records from outside sources obtained and reviewed including prior ED visits and prior Inpatient records.    Lab Tests:  I ordered and personally interpreted labs.  The pertinent results include: CBC, CMP, lipase, UA   Imaging Studies ordered:  I ordered imaging studies including CT abdomen pelvis I independently visualized and interpreted obtained imaging which showed NAD I agree with the radiologist interpretation.   Cardiac Monitoring:  The patient was maintained on a cardiac monitor.  I personally viewed and interpreted the cardiac monitor which showed an underlying rhythm of: NSR   Medicines ordered:  I ordered medication including Bentyl for abdominal discomfort Reevaluation of the patient after  these medicines showed that the patient: improved   Problem List / ED Course:  Abdominal distention   Reevaluation:  After the interventions noted above, I reevaluated the patient and found that they have: improved   Disposition:  After consideration of the diagnostic results and the patients response to treatment, I feel that the patent would benefit from close outpatient follow-up.          Final Clinical Impression(s) / ED Diagnoses Final diagnoses:  Generalized abdominal pain    Rx / DC Orders ED Discharge Orders          Ordered    dicyclomine (BENTYL) 20 MG tablet  2 times daily        04/26/22 1731              Wynetta FinesMessick, Kynzi Levay C, MD 04/26/22 [phone]

## 2022-05-07 NOTE — Progress Notes (Unsigned)
Follow up visit  Subjective:   Nicholas [name], male    DOB: 12-19-1960, 61 y.o.   MRN: [id number]    HPI   No chief complaint on file.   61 y.o. African-American male  with hypertension, type 2 diabetes mellitus, hypothyroidism,h/o gastric ulcer, bipolar disorder, h/o seizures, h/o prior tobacco and cocaine abuse, mildly reduced LVEF.  ***  Patient had a normal CCTA with calcium score of o in 10/2020.   Current Outpatient Medications:    albuterol (PROVENTIL HFA;VENTOLIN HFA) 108 (90 Base) MCG/ACT inhaler, Inhale 1-2 puffs into the lungs every 6 (six) hours as needed for wheezing or shortness of breath., Disp: 1 Inhaler, Rfl: 0   atorvastatin (LIPITOR) 80 MG tablet, Take 1 tablet (80 mg total) by mouth at bedtime. (Patient taking differently: Take 80 mg by mouth daily.), Disp: 30 tablet, Rfl: 3   budesonide-formoterol (SYMBICORT) 80-4.5 MCG/ACT inhaler, Inhale 2 puffs into the lungs daily., Disp: , Rfl:    clopidogrel (PLAVIX) 75 MG tablet, Take 1 tablet (75 mg total) by mouth daily., Disp: 30 tablet, Rfl: 01   dicyclomine (BENTYL) 20 MG tablet, Take 1 tablet (20 mg total) by mouth 2 (two) times daily., Disp: 20 tablet, Rfl: 0   doxycycline (VIBRA-TABS) 100 MG tablet, Take 1 tablet (100 mg total) by mouth 2 (two) times daily., Disp: 20 tablet, Rfl: 0   FARXIGA 10 MG TABS tablet, Take 10 mg by mouth daily., Disp: , Rfl:    fenofibrate (TRICOR) 145 MG tablet, Take 1 tablet (145 mg total) by mouth daily., Disp: 30 tablet, Rfl: 0   gabapentin (NEURONTIN) 400 MG capsule, Take 2 capsules (800 mg total) by mouth at bedtime., Disp: 60 capsule, Rfl: 0   gabapentin (NEURONTIN) 600 MG tablet, Take 1 tablet (600 mg total) by mouth daily at 6 (six) AM., Disp: 30 tablet, Rfl: 0   HUMALOG KWIKPEN 100 UNIT/ML KwikPen, Inject 0-14 Units into the skin 4 (four) times daily as needed (high blood sugar). Inject into the skin up to 4 times a day, per sliding scale: BGL 160 or greater = 10 units; 200 or  greater = 14 units, Disp: , Rfl:    latanoprost (XALATAN) 0.005 % ophthalmic solution, Place 1 drop into both eyes at bedtime. , Disp: , Rfl:    levETIRAcetam (KEPPRA) 250 MG tablet, Take 250 mg by mouth in the morning and at bedtime., Disp: , Rfl:    metFORMIN (GLUCOPHAGE) 1000 MG tablet, Take 500 mg by mouth 2 (two) times daily., Disp: , Rfl:    metoCLOPramide (REGLAN) 10 MG tablet, Take 10 mg by mouth 4 (four) times daily., Disp: , Rfl:    nortriptyline (PAMELOR) 50 MG capsule, Take 100 mg by mouth at bedtime. , Disp: , Rfl:    oxyCODONE-acetaminophen (PERCOCET) 10-325 MG tablet, Take 1.5 tablets by mouth every 6 (six) hours as needed for pain., Disp: , Rfl:    oxyCODONE-acetaminophen (PERCOCET) 10-325 MG tablet, Take 1 tablet by mouth every 4 (four) hours as needed for pain., Disp: 30 tablet, Rfl: 0   oxyCODONE-acetaminophen (PERCOCET) 5-325 MG tablet, Take 1-2 tablets by mouth every 4 (four) hours as needed for severe pain., Disp: 30 tablet, Rfl: 0   pantoprazole (PROTONIX) 40 MG tablet, Take 40 mg by mouth daily., Disp: , Rfl:    polyethylene glycol powder (GLYCOLAX/MIRALAX) 17 GM/SCOOP powder, Take 17 g by mouth daily as needed for mild constipation., Disp: , Rfl:    sertraline (ZOLOFT) 50 MG tablet, Take  50 mg by mouth daily., Disp: , Rfl:    SUMAtriptan (IMITREX) 100 MG tablet, Take 1 tablet (100 mg total) by mouth daily as needed for migraine or headache. May repeat in 2 hours if headache persists or recurs., Disp: 20 tablet, Rfl: 0   tamsulosin (FLOMAX) 0.4 MG CAPS capsule, Take 0.4 mg by mouth in the morning and at bedtime., Disp: , Rfl:    timolol (TIMOPTIC) 0.5 % ophthalmic solution, Place 1 drop into both eyes 2 (two) times daily. , Disp: , Rfl:    zonisamide (ZONEGRAN) 50 MG capsule, Take 100 mg by mouth at bedtime., Disp: , Rfl:   Cardiovascular & other pertient studies:   EKG 09/02/2020: Sinus rhythm.  Leftward axis.  Nonspecific T wave abnormality.  CCTA 10/2020: 1. Coronary  calcium score of 0. 2. Normal coronary origin with right dominance. 3. Main pulmonary artery is at the upper limit of normal, 42m. 4. CAD-RADS = 0.   RECOMMENDATIONS: No evidence of CAD (0%). Consider non-atherosclerotic causes of chest pain.     Echocardiogram 08/2020:  1. Left ventricular ejection fraction, by estimation, is 45 to 50%. The  left ventricle has mildly decreased function. The left ventricle  demonstrates regional wall motion abnormalities (see scoring  diagram/findings for description). There is mild left  ventricular hypertrophy. Left ventricular diastolic parameters are  consistent with Grade I diastolic dysfunction (impaired relaxation). There  is moderate hypokinesis of the left ventricular, basal-mid inferolateral  wall and inferior wall suggesting  possible circumflex territory ischemia   2. Right ventricular systolic function is mildly reduced. The right  ventricular size is normal. There is normal pulmonary artery systolic  pressure. The estimated right ventricular systolic pressure is 288.1mmHg.   3. The mitral valve is abnormal. Trivial mitral valve regurgitation.   4. The aortic valve is tricuspid. Aortic valve regurgitation is not  visualized.   5. The inferior vena cava is normal in size with greater than 50%  respiratory variability, suggesting right atrial pressure of 3 mmHg.   Lexiscan nuclear stress test 08/2020: 1. No reversible ischemia or infarction. 2. Normal left ventricular wall motion. 3. Left ventricular ejection fraction 46% 4. Non invasive risk stratification*: Low  Recent labs: 09/13/2020: Glucose 179, BUN/Cr 21/1.39. EGFR >60. Na/K 140/4.4. Rest of the CMP normal HbA1C 7.1% Chol 79, TG 102, HDL 25, LDL 34 TSH 4.2 normal Trop HS 4,4    Review of Systems  Cardiovascular:  Positive for chest pain. Negative for dyspnea on exertion, leg swelling, palpitations and syncope.        There were no vitals filed for this  visit.    There is no height or weight on file to calculate BMI. There were no vitals filed for this visit.    Objective:   Physical Exam Vitals and nursing note reviewed.  Constitutional:      General: He is not in acute distress. Neck:     Vascular: No JVD.  Cardiovascular:     Rate and Rhythm: Normal rate and regular rhythm.     Heart sounds: Normal heart sounds. No murmur heard. Pulmonary:     Effort: Pulmonary effort is normal.     Breath sounds: Normal breath sounds. No wheezing or rales.          Assessment & Recommendations:   61y.o. African-American male  with hypertension, type 2 diabetes mellitus, hypothyroidism,h/o gastric ulcer, bipolar disorder, h/o seizures, h/o prior tobacco and cocaine abuse, mildly reduced LVEF.  Chest pain:  Atypical, but has risk factors for CAD. Stress test with no ischemia, but mildly reduced EF. Will obtain coronary CTA for definitive coronary anatomy evaluation.  Mildly reduced EF: Likely hypertensive cardiomyopathy.  No clinical signs of heart failure. Conntue valsartan.  F/u as needed.    Nigel Mormon, MD Pager: [phone] Office: [phone]

## 2022-05-08 ENCOUNTER — Encounter: Payer: Self-pay | Admitting: Cardiology

## 2022-05-08 ENCOUNTER — Ambulatory Visit: Payer: Medicare Other | Admitting: Cardiology

## 2022-05-08 ENCOUNTER — Inpatient Hospital Stay: Payer: Medicare Other

## 2022-05-08 VITALS — BP 150/98 | HR 84 | Temp 98.0°F | Resp 16 | Ht 67.0 in | Wt 201.0 lb

## 2022-05-08 DIAGNOSIS — R002 Palpitations: Secondary | ICD-10-CM | POA: Insufficient documentation

## 2022-05-08 DIAGNOSIS — R072 Precordial pain: Secondary | ICD-10-CM

## 2022-06-26 ENCOUNTER — Other Ambulatory Visit: Payer: Self-pay | Admitting: Registered Nurse

## 2022-06-26 DIAGNOSIS — R519 Headache, unspecified: Secondary | ICD-10-CM

## 2022-06-27 ENCOUNTER — Observation Stay (HOSPITAL_COMMUNITY): Payer: Medicare Other

## 2022-06-27 ENCOUNTER — Observation Stay (HOSPITAL_COMMUNITY)
Admission: EM | Admit: 2022-06-27 | Discharge: 2022-07-01 | Disposition: A | Discharge status: Skilled Nursing Facility | Payer: Medicare Other | Attending: Family Medicine | Admitting: Family Medicine

## 2022-06-27 ENCOUNTER — Emergency Department (HOSPITAL_COMMUNITY): Payer: Medicare Other

## 2022-06-27 ENCOUNTER — Encounter (HOSPITAL_COMMUNITY): Payer: Self-pay

## 2022-06-27 ENCOUNTER — Other Ambulatory Visit: Payer: Self-pay

## 2022-06-27 DIAGNOSIS — Z79899 Other long term (current) drug therapy: Secondary | ICD-10-CM | POA: Insufficient documentation

## 2022-06-27 DIAGNOSIS — E1110 Type 2 diabetes mellitus with ketoacidosis without coma: Secondary | ICD-10-CM | POA: Insufficient documentation

## 2022-06-27 DIAGNOSIS — E860 Dehydration: Secondary | ICD-10-CM | POA: Insufficient documentation

## 2022-06-27 DIAGNOSIS — Z87891 Personal history of nicotine dependence: Secondary | ICD-10-CM | POA: Diagnosis not present

## 2022-06-27 DIAGNOSIS — E1140 Type 2 diabetes mellitus with diabetic neuropathy, unspecified: Secondary | ICD-10-CM | POA: Diagnosis not present

## 2022-06-27 DIAGNOSIS — I10 Essential (primary) hypertension: Secondary | ICD-10-CM | POA: Diagnosis present

## 2022-06-27 DIAGNOSIS — F319 Bipolar disorder, unspecified: Secondary | ICD-10-CM

## 2022-06-27 DIAGNOSIS — Z7985 Long-term (current) use of injectable non-insulin antidiabetic drugs: Secondary | ICD-10-CM | POA: Diagnosis not present

## 2022-06-27 DIAGNOSIS — N179 Acute kidney failure, unspecified: Secondary | ICD-10-CM | POA: Insufficient documentation

## 2022-06-27 DIAGNOSIS — N1831 Chronic kidney disease, stage 3a: Secondary | ICD-10-CM | POA: Insufficient documentation

## 2022-06-27 DIAGNOSIS — Z20822 Contact with and (suspected) exposure to covid-19: Secondary | ICD-10-CM | POA: Diagnosis not present

## 2022-06-27 DIAGNOSIS — M1712 Unilateral primary osteoarthritis, left knee: Secondary | ICD-10-CM | POA: Insufficient documentation

## 2022-06-27 DIAGNOSIS — Z7984 Long term (current) use of oral hypoglycemic drugs: Secondary | ICD-10-CM | POA: Diagnosis not present

## 2022-06-27 DIAGNOSIS — E1122 Type 2 diabetes mellitus with diabetic chronic kidney disease: Secondary | ICD-10-CM | POA: Insufficient documentation

## 2022-06-27 DIAGNOSIS — R4781 Slurred speech: Secondary | ICD-10-CM

## 2022-06-27 DIAGNOSIS — J45909 Unspecified asthma, uncomplicated: Secondary | ICD-10-CM | POA: Diagnosis not present

## 2022-06-27 DIAGNOSIS — Z7902 Long term (current) use of antithrombotics/antiplatelets: Secondary | ICD-10-CM | POA: Diagnosis not present

## 2022-06-27 DIAGNOSIS — G40909 Epilepsy, unspecified, not intractable, without status epilepticus: Secondary | ICD-10-CM

## 2022-06-27 DIAGNOSIS — R531 Weakness: Principal | ICD-10-CM | POA: Insufficient documentation

## 2022-06-27 DIAGNOSIS — N183 Chronic kidney disease, stage 3 unspecified: Secondary | ICD-10-CM | POA: Diagnosis present

## 2022-06-27 DIAGNOSIS — Z794 Long term (current) use of insulin: Secondary | ICD-10-CM | POA: Insufficient documentation

## 2022-06-27 DIAGNOSIS — R29898 Other symptoms and signs involving the musculoskeletal system: Secondary | ICD-10-CM | POA: Diagnosis not present

## 2022-06-27 DIAGNOSIS — E872 Acidosis, unspecified: Secondary | ICD-10-CM

## 2022-06-27 DIAGNOSIS — E785 Hyperlipidemia, unspecified: Secondary | ICD-10-CM

## 2022-06-27 DIAGNOSIS — R4701 Aphasia: Secondary | ICD-10-CM | POA: Diagnosis present

## 2022-06-27 DIAGNOSIS — I129 Hypertensive chronic kidney disease with stage 1 through stage 4 chronic kidney disease, or unspecified chronic kidney disease: Secondary | ICD-10-CM | POA: Insufficient documentation

## 2022-06-27 DIAGNOSIS — Z8673 Personal history of transient ischemic attack (TIA), and cerebral infarction without residual deficits: Secondary | ICD-10-CM | POA: Diagnosis not present

## 2022-06-27 LAB — DIFFERENTIAL
Abs Immature Granulocytes: 0.05 10*3/uL (ref 0.00–0.07)
Basophils Absolute: 0 10*3/uL (ref 0.0–0.1)
Basophils Relative: 0 %
Eosinophils Absolute: 0 10*3/uL (ref 0.0–0.5)
Eosinophils Relative: 0 %
Immature Granulocytes: 0 %
Lymphocytes Relative: 10 %
Lymphs Abs: 1.1 10*3/uL (ref 0.7–4.0)
Monocytes Absolute: 0.3 10*3/uL (ref 0.1–1.0)
Monocytes Relative: 3 %
Neutro Abs: 9.7 10*3/uL — ABNORMAL HIGH (ref 1.7–7.7)
Neutrophils Relative %: 87 %

## 2022-06-27 LAB — COMPREHENSIVE METABOLIC PANEL
ALT: 33 U/L (ref 0–44)
AST: 28 U/L (ref 15–41)
Albumin: 4.2 g/dL (ref 3.5–5.0)
Alkaline Phosphatase: 67 U/L (ref 38–126)
Anion gap: 11 (ref 5–15)
BUN: 23 mg/dL — ABNORMAL HIGH (ref 6–20)
CO2: 17 mmol/L — ABNORMAL LOW (ref 22–32)
Calcium: 9.6 mg/dL (ref 8.9–10.3)
Chloride: 108 mmol/L (ref 98–111)
Creatinine, Ser: 1.9 mg/dL — ABNORMAL HIGH (ref 0.61–1.24)
GFR, Estimated: 40 mL/min — ABNORMAL LOW (ref >60)
Glucose, Bld: 225 mg/dL — ABNORMAL HIGH (ref 70–99)
Potassium: 4.8 mmol/L (ref 3.5–5.1)
Sodium: 136 mmol/L (ref 135–145)
Total Bilirubin: 0.3 mg/dL (ref 0.3–1.2)
Total Protein: 7 g/dL (ref 6.5–8.1)

## 2022-06-27 LAB — I-STAT CHEM 8, ED
BUN: 25 mg/dL — ABNORMAL HIGH (ref 6–20)
Calcium, Ion: 1.35 mmol/L (ref 1.15–1.40)
Chloride: 108 mmol/L (ref 98–111)
Creatinine, Ser: 1.8 mg/dL — ABNORMAL HIGH (ref 0.61–1.24)
Glucose, Bld: 219 mg/dL — ABNORMAL HIGH (ref 70–99)
HCT: 43 % (ref 39.0–52.0)
Hemoglobin: 14.6 g/dL (ref 13.0–17.0)
Potassium: 4.7 mmol/L (ref 3.5–5.1)
Sodium: 140 mmol/L (ref 135–145)
TCO2: 18 mmol/L — ABNORMAL LOW (ref 22–32)

## 2022-06-27 LAB — ETHANOL: Alcohol, Ethyl (B): <10 mg/dL (ref <10)

## 2022-06-27 LAB — CBC
HCT: 41.5 % (ref 39.0–52.0)
Hemoglobin: 13.4 g/dL (ref 13.0–17.0)
MCH: 27.6 pg (ref 26.0–34.0)
MCHC: 32.3 g/dL (ref 30.0–36.0)
MCV: 85.6 fL (ref 80.0–100.0)
Platelets: 218 10*3/uL (ref 150–400)
RBC: 4.85 MIL/uL (ref 4.22–5.81)
RDW: 15.1 % (ref 11.5–15.5)
WBC: 11.2 10*3/uL — ABNORMAL HIGH (ref 4.0–10.5)
nRBC: 0 % (ref 0.0–0.2)

## 2022-06-27 LAB — RAPID URINE DRUG SCREEN, HOSP PERFORMED
Amphetamines: NOT DETECTED
Barbiturates: NOT DETECTED
Benzodiazepines: NOT DETECTED
Cocaine: NOT DETECTED
Opiates: NOT DETECTED
Tetrahydrocannabinol: NOT DETECTED

## 2022-06-27 LAB — URINALYSIS, ROUTINE W REFLEX MICROSCOPIC
Bacteria, UA: NONE SEEN
Bilirubin Urine: NEGATIVE
Glucose, UA: >=500 mg/dL — AB
Hgb urine dipstick: NEGATIVE
Ketones, ur: NEGATIVE mg/dL
Leukocytes,Ua: NEGATIVE
Nitrite: NEGATIVE
Protein, ur: NEGATIVE mg/dL
Specific Gravity, Urine: 1.014 (ref 1.005–1.030)
pH: 5 (ref 5.0–8.0)

## 2022-06-27 LAB — HEMOGLOBIN A1C
Hgb A1c MFr Bld: 8.4 % — ABNORMAL HIGH (ref 4.8–5.6)
Mean Plasma Glucose: 194.38 mg/dL

## 2022-06-27 LAB — LACTIC ACID, PLASMA
Lactic Acid, Venous: 3.2 mmol/L (ref 0.5–1.9)
Lactic Acid, Venous: 2.9 mmol/L (ref 0.5–1.9)

## 2022-06-27 LAB — PROTIME-INR
INR: 1.2 (ref 0.8–1.2)
Prothrombin Time: 15.2 seconds (ref 11.4–15.2)

## 2022-06-27 LAB — RESP PANEL BY RT-PCR (FLU A&B, COVID) ARPGX2
Influenza A by PCR: NEGATIVE
Influenza B by PCR: NEGATIVE
SARS Coronavirus 2 by RT PCR: NEGATIVE

## 2022-06-27 LAB — APTT: aPTT: 26 seconds (ref 24–36)

## 2022-06-27 LAB — CBG MONITORING, ED: Glucose-Capillary: 218 mg/dL — ABNORMAL HIGH (ref 70–99)

## 2022-06-27 MED ORDER — LEVETIRACETAM IN NACL 500 MG/100ML IV SOLN
500.0000 mg | Freq: Once | INTRAVENOUS | Status: AC
  Administered 2022-06-27: 500 mg via INTRAVENOUS
  Filled 2022-06-27: qty 100

## 2022-06-27 MED ORDER — TIMOLOL MALEATE 0.5 % OP SOLN
1.0000 [drp] | Freq: Every day | OPHTHALMIC | Status: DC
  Administered 2022-06-27 – 2022-07-01 (×5): 1 [drp] via OPHTHALMIC
  Filled 2022-06-27 (×3): qty 5

## 2022-06-27 MED ORDER — ENOXAPARIN SODIUM 40 MG/0.4ML IJ SOSY
40.0000 mg | PREFILLED_SYRINGE | INTRAMUSCULAR | Status: DC
  Administered 2022-06-27 – 2022-06-30 (×4): 40 mg via SUBCUTANEOUS
  Filled 2022-06-27 (×4): qty 0.4

## 2022-06-27 MED ORDER — INSULIN ASPART 100 UNIT/ML IJ SOLN
0.0000 [IU] | Freq: Three times a day (TID) | INTRAMUSCULAR | Status: DC
  Administered 2022-06-28 (×2): 3 [IU] via SUBCUTANEOUS
  Administered 2022-06-29: 2 [IU] via SUBCUTANEOUS
  Administered 2022-06-29: 5 [IU] via SUBCUTANEOUS
  Administered 2022-06-29: 2 [IU] via SUBCUTANEOUS
  Administered 2022-06-30: 5 [IU] via SUBCUTANEOUS
  Administered 2022-06-30: 8 [IU] via SUBCUTANEOUS
  Administered 2022-06-30: 5 [IU] via SUBCUTANEOUS
  Administered 2022-07-01: 3 [IU] via SUBCUTANEOUS
  Administered 2022-07-01: 5 [IU] via SUBCUTANEOUS
  Administered 2022-07-01: 11 [IU] via SUBCUTANEOUS

## 2022-06-27 MED ORDER — VERAPAMIL HCL ER 120 MG PO TBCR
120.0000 mg | EXTENDED_RELEASE_TABLET | Freq: Every day | ORAL | Status: DC
Start: 2022-06-27 — End: 2022-06-28
  Administered 2022-06-27: 120 mg via ORAL
  Filled 2022-06-27 (×2): qty 1

## 2022-06-27 MED ORDER — MOMETASONE FURO-FORMOTEROL FUM 100-5 MCG/ACT IN AERO
2.0000 | INHALATION_SPRAY | Freq: Two times a day (BID) | RESPIRATORY_TRACT | Status: DC
  Administered 2022-06-28 – 2022-06-30 (×4): 2 via RESPIRATORY_TRACT
  Filled 2022-06-27: qty 8.8

## 2022-06-27 MED ORDER — OXYCODONE-ACETAMINOPHEN 5-325 MG PO TABS
1.0000 | ORAL_TABLET | Freq: Once | ORAL | Status: DC | PRN
Start: 2022-06-27 — End: 2022-06-27

## 2022-06-27 MED ORDER — PROCHLORPERAZINE EDISYLATE 10 MG/2ML IJ SOLN
10.0000 mg | Freq: Once | INTRAMUSCULAR | Status: AC
  Administered 2022-06-27: 10 mg via INTRAVENOUS
  Filled 2022-06-27: qty 2

## 2022-06-27 MED ORDER — ZONISAMIDE 100 MG PO CAPS
200.0000 mg | ORAL_CAPSULE | Freq: Two times a day (BID) | ORAL | Status: DC
  Administered 2022-06-28 – 2022-07-01 (×8): 200 mg via ORAL
  Filled 2022-06-27 (×11): qty 2

## 2022-06-27 MED ORDER — ACETAMINOPHEN 650 MG RE SUPP: 650.0000 mg | Freq: Four times a day (QID) | RECTAL | Status: DC | PRN

## 2022-06-27 MED ORDER — SODIUM CHLORIDE 0.9 % IV SOLN: INTRAVENOUS | Status: AC

## 2022-06-27 MED ORDER — OXYCODONE-ACETAMINOPHEN 5-325 MG PO TABS
1.0000 | ORAL_TABLET | Freq: Once | ORAL | Status: AC | PRN
  Administered 2022-06-27: 1 via ORAL
  Filled 2022-06-27: qty 1

## 2022-06-27 MED ORDER — DIPHENHYDRAMINE HCL 50 MG/ML IJ SOLN
25.0000 mg | Freq: Once | INTRAMUSCULAR | Status: AC
  Administered 2022-06-27: 25 mg via INTRAVENOUS
  Filled 2022-06-27: qty 1

## 2022-06-27 MED ORDER — INSULIN ASPART 100 UNIT/ML IJ SOLN
0.0000 [IU] | Freq: Every day | INTRAMUSCULAR | Status: DC
  Administered 2022-06-29: 5 [IU] via SUBCUTANEOUS
  Administered 2022-06-30: 3 [IU] via SUBCUTANEOUS

## 2022-06-27 MED ORDER — LURASIDONE HCL 20 MG PO TABS
20.0000 mg | ORAL_TABLET | Freq: Every day | ORAL | Status: DC
  Administered 2022-06-27 – 2022-06-30 (×4): 20 mg via ORAL
  Filled 2022-06-27 (×6): qty 1

## 2022-06-27 MED ORDER — ACETAMINOPHEN 325 MG PO TABS
650.0000 mg | ORAL_TABLET | Freq: Once | ORAL | Status: AC
  Administered 2022-06-27: 650 mg via ORAL
  Filled 2022-06-27: qty 2

## 2022-06-27 MED ORDER — GABAPENTIN 300 MG PO CAPS
600.0000 mg | ORAL_CAPSULE | Freq: Three times a day (TID) | ORAL | Status: DC
  Administered 2022-06-27 – 2022-07-01 (×12): 600 mg via ORAL
  Filled 2022-06-27 (×12): qty 2

## 2022-06-27 MED ORDER — LATANOPROST 0.005 % OP SOLN
1.0000 [drp] | Freq: Every day | OPHTHALMIC | Status: DC
  Administered 2022-06-28 – 2022-06-30 (×3): 1 [drp] via OPHTHALMIC
  Filled 2022-06-27 (×2): qty 2.5

## 2022-06-27 MED ORDER — ACETAMINOPHEN 325 MG PO TABS
650.0000 mg | ORAL_TABLET | Freq: Four times a day (QID) | ORAL | Status: DC | PRN
  Administered 2022-06-28: 650 mg via ORAL
  Filled 2022-06-27: qty 2

## 2022-06-27 MED ORDER — NORTRIPTYLINE HCL 25 MG PO CAPS
100.0000 mg | ORAL_CAPSULE | Freq: Every day | ORAL | Status: DC
  Administered 2022-06-27 – 2022-06-30 (×4): 100 mg via ORAL
  Filled 2022-06-27 (×6): qty 4

## 2022-06-27 MED ORDER — LACTATED RINGERS IV BOLUS
500.0000 mL | Freq: Once | INTRAVENOUS | Status: AC
  Administered 2022-06-27: 500 mL via INTRAVENOUS

## 2022-06-27 MED ORDER — TRAZODONE HCL 100 MG PO TABS
100.0000 mg | ORAL_TABLET | Freq: Every day | ORAL | Status: DC
  Administered 2022-06-27 – 2022-06-29 (×3): 100 mg via ORAL
  Filled 2022-06-27: qty 1
  Filled 2022-06-27: qty 2
  Filled 2022-06-27 (×2): qty 1

## 2022-06-27 MED ORDER — ATORVASTATIN CALCIUM 80 MG PO TABS
80.0000 mg | ORAL_TABLET | Freq: Every day | ORAL | Status: DC
  Administered 2022-06-27 – 2022-06-30 (×4): 80 mg via ORAL
  Filled 2022-06-27 (×3): qty 1
  Filled 2022-06-27: qty 2

## 2022-06-27 MED ORDER — ALBUTEROL SULFATE (2.5 MG/3ML) 0.083% IN NEBU: 2.5000 mg | INHALATION_SOLUTION | Freq: Four times a day (QID) | RESPIRATORY_TRACT | Status: DC | PRN

## 2022-06-27 MED ORDER — SODIUM CHLORIDE 0.9 % IV BOLUS
1000.0000 mL | Freq: Once | INTRAVENOUS | Status: AC
  Administered 2022-06-27: 1000 mL via INTRAVENOUS

## 2022-06-27 MED ORDER — INSULIN GLARGINE-YFGN 100 UNIT/ML ~~LOC~~ SOLN
25.0000 [IU] | Freq: Every day | SUBCUTANEOUS | Status: DC
  Administered 2022-06-28 – 2022-06-29 (×3): 25 [IU] via SUBCUTANEOUS
  Filled 2022-06-27 (×5): qty 0.25

## 2022-06-27 NOTE — ED Notes (Signed)
Pt in room resting NAD Chest rising and falling. Phone at bedside. Pt on monitor

## 2022-06-27 NOTE — ED Provider Notes (Signed)
[name] Provider Note   CSN: [id number] Arrival date & time: 06/27/22  [id number]     History  Chief Complaint  Patient presents with   Aphasia    Nicholas Leaverdward W Herzberg is a 61 y.o. male with past medical history significant for previous seizure disorder, insulin requiring type 2 diabetes, CKD, hypertension, previous stroke with some residual deficits including some right-sided deficits, previous matization, bipolar disorder, recently diagnosed schizophrenia who presents with concern for altered speech, glucose issues, right arm weakness, left leg weakness and numbness.  Patient with a history of previous stroke, currently taking Plavix, cholesterol medications.  Today he endorses right-sided headache that feels like a vice grip.  Last known well was last night at 7 PM.  Patient with slow, stuttering speech, with abnormal tongue movement during speech.  Patient is alert and oriented x3 with some hesitancy.  HPI     Home Medications Prior to Admission medications   Medication Sig Start Date End Date Taking? Authorizing Provider  albuterol (PROVENTIL HFA;VENTOLIN HFA) 108 (90 Base) MCG/ACT inhaler Inhale 1-2 puffs into the lungs every 6 (six) hours as needed for wheezing or shortness of breath. 11/15/18  Yes Rancour, Jeannett SeniorStephen, MD  atorvastatin (LIPITOR) 80 MG tablet Take 1 tablet (80 mg total) by mouth at bedtime. 09/05/20  Yes Danford, Earl Liteshristopher P, MD  budesonide-formoterol (SYMBICORT) 80-4.5 MCG/ACT inhaler Inhale 2 puffs into the lungs daily.   Yes [provider]  Eszopiclone 3 MG TABS Take 3 mg by mouth at bedtime. 06/25/22  Yes [provider]  gabapentin (NEURONTIN) 600 MG tablet Take 1 tablet (600 mg total) by mouth daily at 6 (six) AM. Patient taking differently: Take 600 mg by mouth every 6 (six) hours. 12/06/20  Yes Swayze, Ava, DO  HUMALOG KWIKPEN 100 UNIT/ML KwikPen Inject 0-16 Units into the skin 4 (four) times daily as needed  (high blood sugar). Inject into the skin up to 4 times a day, per sliding scale: BGL 160 or greater = 10 units; 200 or greater = 14 units 11/05/20  Yes [provider]  lactulose (CHRONULAC) 10 GM/15ML solution Take 30 g by mouth daily as needed for moderate constipation. 05/12/22  Yes [provider]  latanoprost (XALATAN) 0.005 % ophthalmic solution Place 1 drop into both eyes at bedtime.  07/04/20  Yes [provider]  lurasidone (LATUDA) 20 MG TABS tablet Take 1 tablet by mouth at bedtime. 06/14/22  Yes [provider]  metFORMIN (GLUCOPHAGE) 1000 MG tablet Take 1,000 mg by mouth 2 (two) times daily. 11/04/20  Yes [provider]  nortriptyline (PAMELOR) 50 MG capsule Take 100 mg by mouth at bedtime.    Yes [provider]  oxyCODONE-acetaminophen (PERCOCET) 10-325 MG tablet Take 1 tablet by mouth every 4 (four) hours as needed for pain. Patient taking differently: Take 1 tablet by mouth every 8 (eight) hours as needed for pain. 02/07/21  Yes Regal, Kirstie PeriNorman S, DPM  SUMAtriptan (IMITREX) 100 MG tablet Take 1 tablet (100 mg total) by mouth daily as needed for migraine or headache. May repeat in 2 hours if headache persists or recurs. 07/25/20  Yes Leatha GildingGherghe, Costin M, MD  tamsulosin (FLOMAX) 0.4 MG CAPS capsule Take 0.4 mg by mouth daily as needed (prostate).   Yes [provider]  timolol (TIMOPTIC) 0.5 % ophthalmic solution Place 1 drop into both eyes daily. 04/06/20  Yes [provider]  TOUJEO MAX SOLOSTAR 300 UNIT/ML Solostar Pen Inject 25 Units into  the skin at bedtime. 06/17/22  Yes [provider]  traZODone (DESYREL) 100 MG tablet Take 100 mg by mouth at bedtime. 06/25/22  Yes [provider]  verapamil (CALAN-SR) 120 MG CR tablet Take 120 mg by mouth daily. 05/09/22  Yes [provider]  zonisamide (ZONEGRAN) 100 MG capsule Take 200 mg by mouth 2 (two) times daily. 04/10/22  Yes [provider]   clopidogrel (PLAVIX) 75 MG tablet Take 1 tablet (75 mg total) by mouth daily. Patient not taking: Reported on 06/27/2022 07/05/20   Azucena Fallen, MD  dicyclomine (BENTYL) 20 MG tablet Take 1 tablet (20 mg total) by mouth 2 (two) times daily. Patient not taking: Reported on 06/27/2022 04/26/22   Wynetta Fines, MD  doxycycline (VIBRA-TABS) 100 MG tablet Take 1 tablet (100 mg total) by mouth 2 (two) times daily. Patient not taking: Reported on 06/27/2022 02/07/21   Lenn Sink, DPM  fenofibrate (TRICOR) 145 MG tablet Take 1 tablet (145 mg total) by mouth daily. Patient not taking: Reported on 06/27/2022 11/02/20   Swayze, Ava, DO  oxyCODONE-acetaminophen (PERCOCET) 10-325 MG tablet Take 1.5 tablets by mouth every 6 (six) hours as needed for pain. Patient not taking: Reported on 06/27/2022 10/24/20   [provider]  predniSONE (DELTASONE) 20 MG tablet Take 20 mg by mouth 2 (two) times daily. 06/26/22   [provider]      Allergies    Finasteride, Levothyroxine, Nsaids, Amoxicillin, Ampicillin, Penicillins, Strawberry extract, Asa [aspirin], Bactrim [sulfamethoxazole-trimethoprim], Dapagliflozin, Depakote [divalproex sodium], Dilantin [phenytoin], Methocarbamol, Other, Risperidone and related, Sitagliptin, Strawberry (diagnostic), Sulfa antibiotics, Tolmetin, Ultram [tramadol hcl], Buprenorphine, and Oatmeal    Review of Systems   Review of Systems  All other systems reviewed and are negative.   Physical Exam Updated Vital Signs BP 123/80   Pulse 92   Temp 97.6 F (36.4 C)   Resp 16   Ht 5\' 7"  (1.702 m)   Wt 89.4 kg   SpO2 97%   BMI 30.85 kg/m  Physical Exam Vitals and nursing note reviewed.  Constitutional:      General: He is not in acute distress.    Appearance: Normal appearance.  HENT:     Head: Normocephalic and atraumatic.  Eyes:     General:        Right eye: No discharge.        Left eye: No discharge.  Cardiovascular:     Rate and Rhythm:  Normal rate and regular rhythm.     Heart sounds: No murmur heard.    No friction rub. No gallop.  Pulmonary:     Effort: Pulmonary effort is normal.     Breath sounds: Normal breath sounds.  Abdominal:     General: Bowel sounds are normal.     Palpations: Abdomen is soft.  Skin:    General: Skin is warm and dry.     Capillary Refill: Capillary refill takes less than 2 seconds.  Neurological:     Mental Status: He is alert and oriented to person, place, and time.     Comments: Patient alert and oriented to self, place, struggled with time but was able to correctly name that it is 2023.  He endorses decreased sensation of the left leg below the knee, as well as decreased sensation to the right side of the face.  He has equal bilateral facial movements.  He has abnormal tongue movement during speech with tongue protruding to the left side of the  mouth, however he is able to protrude the tongue directly outward on isolated prompting.  Pupils are 2 mm bilaterally, but reactive, patient with intact extraocular movements on prompting.  Cranial nerves III through XII grossly intact other than deficits as discussed above.  He has decreased strength to flexion and extension at the left hip, left knee on prompting.  Otherwise intact strength bilaterally, patient reports that right arm is weak, but possibly similar to baseline.  Intact deep tendon reflexes at patella.  Intact finger-nose-finger, intact heel shin.  Romberg and gait not assessed secondary to leg weakness at this time.  Intact object recognition.  Psychiatric:        Mood and Affect: Mood normal.        Behavior: Behavior normal.     ED Results / Procedures / Treatments   Labs (all labs ordered are listed, but only abnormal results are displayed) Labs Reviewed  CBC - Abnormal; Notable for the following components:      Result Value   WBC 11.2 (*)    All other components within normal limits  DIFFERENTIAL - Abnormal; Notable for the  following components:   Neutro Abs 9.7 (*)    All other components within normal limits  COMPREHENSIVE METABOLIC PANEL - Abnormal; Notable for the following components:   CO2 17 (*)    Glucose, Bld 225 (*)    BUN 23 (*)    Creatinine, Ser 1.90 (*)    GFR, Estimated 40 (*)    All other components within normal limits  LACTIC ACID, PLASMA - Abnormal; Notable for the following components:   Lactic Acid, Venous 3.2 (*)    All other components within normal limits  I-STAT CHEM 8, ED - Abnormal; Notable for the following components:   BUN 25 (*)    Creatinine, Ser 1.80 (*)    Glucose, Bld 219 (*)    TCO2 18 (*)    All other components within normal limits  CBG MONITORING, ED - Abnormal; Notable for the following components:   Glucose-Capillary 218 (*)    All other components within normal limits  RESP PANEL BY RT-PCR (FLU A&B, COVID) ARPGX2  ETHANOL  PROTIME-INR  APTT  RAPID URINE DRUG SCREEN, HOSP PERFORMED  URINALYSIS, ROUTINE W REFLEX MICROSCOPIC  LACTIC ACID, PLASMA  MISC [facility] TEST (SEND OUT)    EKG EKG Interpretation  Date/Time:  Saturday June 27 2022 08:01:05 EDT Ventricular Rate:  89 PR Interval:  162 QRS Duration: 89 QT Interval:  353 QTC Calculation: 430 R Axis:   -38 Text Interpretation: Sinus rhythm Left axis deviation Borderline T wave abnormalities Confirmed by Gloris Manchester ([id number]) on 06/27/2022 8:11:53 AM  Radiology MR BRAIN WO CONTRAST  Result Date: 06/27/2022 CLINICAL DATA:  Aphasia EXAM: MRI HEAD WITHOUT CONTRAST TECHNIQUE: Multiplanar, multiecho pulse sequences of the brain and surrounding structures were obtained without intravenous contrast. COMPARISON:  Head CT from earlier the same day and brain MRI 03/13/2021 FINDINGS: Brain: No acute infarction, hemorrhage, hydrocephalus, extra-axial collection or mass lesion. No white matter disease or atrophy Vascular: Major flow voids are preserved Skull and upper cervical spine: Normal marrow signal Sinuses/Orbits:  Negative. Other: Motion artifact, reportedly due to uncontrolled snoring. IMPRESSION: Negative motion degraded brain MRI. Electronically Signed   By: Tiburcio Pea M.D.   On: 06/27/2022 12:29   CT HEAD WO CONTRAST  Result Date: 06/27/2022 CLINICAL DATA:  Weakness.  Possible stroke. EXAM: CT HEAD WITHOUT CONTRAST TECHNIQUE: Contiguous axial images were obtained from the base  of the skull through the vertex without intravenous contrast. RADIATION DOSE REDUCTION: This exam was performed according to the departmental dose-optimization program which includes automated exposure control, adjustment of the mA and/or kV according to patient size and/or use of iterative reconstruction technique. COMPARISON:  06/25/2021 FINDINGS: Brain: No evidence of acute infarction, hemorrhage, hydrocephalus, extra-axial collection or mass lesion/mass effect. Vascular: No hyperdense vessel or unexpected calcification. Skull: Normal. Negative for fracture or focal lesion. Sinuses/Orbits: No acute finding. Other: None. IMPRESSION: No acute findings. Electronically Signed   By: Elberta Fortis M.D.   On: 06/27/2022 09:08    Procedures Procedures    Medications Ordered in ED Medications  acetaminophen (TYLENOL) tablet 650 mg (650 mg Oral Given 06/27/22 0839)  prochlorperazine (COMPAZINE) injection 10 mg (10 mg Intravenous Given 06/27/22 0843)  sodium chloride 0.9 % bolus 1,000 mL (0 mLs Intravenous Stopped 06/27/22 1001)  diphenhydrAMINE (BENADRYL) injection 25 mg (25 mg Intravenous Given 06/27/22 0843)  lactated ringers bolus 500 mL (500 mLs Intravenous New Bag/Given 06/27/22 1110)  levETIRAcetam (KEPPRA) IVPB 500 mg/100 mL premix (0 mg Intravenous Stopped 06/27/22 1349)    ED Course/ Medical Decision Making/ A&P                           Medical Decision Making Amount and/or Complexity of Data Reviewed Labs: ordered. Radiology: ordered.  Risk OTC drugs. Prescription drug management.   This patient is a 61 y.o. male  who presents to the ED for concern of abnormal speech, possible worsening right arm weakness, right face decree sensation, as well as left leg weakness as well as numbness, this involves an extensive number of treatment options, and is a complaint that carries with it a high risk of complications and morbidity. The emergent differential diagnosis prior to evaluation includes, but is not limited to, stroke, ICH, .   This is not an exhaustive differential.   Past Medical History / Co-morbidities / Social History: previous seizure disorder, insulin requiring type 2 diabetes, CKD, hypertension, previous stroke with some residual deficits including some right-sided deficits, previous matization, bipolar disorder, recently diagnosed schizophrenia   Additional history: Chart reviewed. Pertinent results include: Reviewed lab work, imaging from recent previous emergency department visits, as well as outpatient psychiatry, cardiology visits  Physical Exam: Physical exam performed. The pertinent findings include: See above for more detailed neurologic exam, overall patient in no acute distress, protecting his own airway, stable vital signs throughout his evaluation, he has some weakness and numbness of the left leg, weakness up to the level of the hip, but with intact patellar reflexes, he has numbness that he endorses from the knee down.  Somewhat difficult to assess strength of bilateral arms due to previous stroke and stroke deficits, however patient endorses possible mild right-sided weakness greater than left.  I do not notice significant appreciable difference.  Some dysarthria noted as well as frustration with speech but patient alert and oriented x4, intact object recognition, speech is comprehensible albeit slow and mumbled.  Lab Tests: I ordered, and personally interpreted labs.  The pertinent results include: CBC overall unremarkable other than mild leukocytosis, white blood cells 11.2.  His CMP is  notable for some acidosis with bicarb 17.  Possible recent seizure versus other.  He is mildly hyperglycemic with a glucose of 225 on arrival.  His somewhat elevated kidney function from baseline with creatinine 1.9, normal baseline around 1.4.   Imaging Studies: I ordered imaging studies including CT  head without contrast, MR brain without contrast. I independently visualized and interpreted imaging which showed no acute intracranial abnormality. I agree with the radiologist interpretation.   Cardiac Monitoring:  The patient was maintained on a cardiac monitor.  My attending physician Dr. Durwin Nora viewed and interpreted the cardiac monitored which showed an underlying rhythm of: NSR. I agree with this interpretation.   Medications: I ordered medication including Keppra for possible seizure prophylaxis, Compazine, Benadryl, fluid bolus, Tylenol for headache on arrival. Reevaluation of the patient after these medicines showed that the patient improved. I have reviewed the patients home medicines and have made adjustments as needed.  Patient's dysarthria spontaneously resolved but his other neurologic deficits have remained present including reported numbness of right-sided face, left leg below the knee, as well as left leg weakness from the hip down.  Consultations Obtained: I requested consultation with the neurologist, spoke with Dr. Iver Nestle,  and discussed lab and imaging findings as well as pertinent plan - they recommend: patient stay and be evaluated by neurology, as he would otherwise likely need to be admitted for his inability to walk  3:16 PM Care of Nicholas [name] transferred to [facility] and Dr. Anitra Lauth at the end of my shift as the patient will require reassessment once labs/imaging have resulted. Patient presentation, ED course, and plan of care discussed with review of all pertinent labs and imaging. Please see his/her note for further details regarding further ED course and  disposition. Plan at time of handoff is Dr. Iver Nestle to see and evaluate patient to discuss symptoms, possible admit if neuro deficits remain vs. Dc and outpatient follow up. This may be altered or completely changed at the discretion of the oncoming team pending results of further workup.   Final Clinical Impression(s) / ED Diagnoses Final diagnoses:  None    Rx / DC Orders ED Discharge Orders     None         [place] 06/27/22 1516    Gloris Manchester, MD 06/28/22 1204

## 2022-06-27 NOTE — ED Provider Notes (Cosign Needed Addendum)
  Physical Exam  BP (!) 115/91   Pulse 80   Temp 98.3 F (36.8 C) (Oral)   Resp (!) 9   Ht 5\' 7"  (1.702 m)   Wt 89.4 kg   SpO2 95%   BMI 30.85 kg/m   Physical Exam Vitals and nursing note reviewed.  Constitutional:      Appearance: Normal appearance.  HENT:     Head: Normocephalic and atraumatic.     Mouth/Throat:     Mouth: Mucous membranes are moist.  Cardiovascular:     Rate and Rhythm: Normal rate.  Pulmonary:     Effort: Pulmonary effort is normal.  Abdominal:     General: Abdomen is flat.  Musculoskeletal:     Cervical back: Normal range of motion and neck supple.  Skin:    General: Skin is warm and dry.  Neurological:     Mental Status: He is alert. Mental status is at baseline.     Procedures  Procedures  ED Course / MDM    Medical Decision Making Amount and/or Complexity of Data Reviewed Labs: ordered. Radiology: ordered.  Risk OTC drugs. Prescription drug management. Decision regarding hospitalization.   Patient care assumed Christian P. PA at shift change, please see his note for a full HPI. Briefly patient here with right sided face, decreased sensation of the right arm, unable to move his left leg decree sensation from the knee down.  Multiple neuro work-ups in the past, according to my chart review, patient with extensive CT head, MRI brain's since the year 2018 with suspicion for seizure-like disorder.Patient taking Zonegran according to records 200mg  daily.  Plan is for neurology evaluation and further disposition.  Neurology recommendations were made by Dr. 2019 who recommended MRI Lumbar spine fort further workup.   11:52 PM Spoke to Dr. hospitalist who will admit patient for further management.     Portions of this note were generated with Iver Nestle. Dictation errors may occur despite best attempts at proofreading.         Loney Loh, PA-C 06/27/22 [id number] PA-C 06/27/22 [address], MD 06/27/22 2320    Riley Kill, PA-C 06/27/22 2352    Claude Manges, MD 06/28/22 [phone]

## 2022-06-27 NOTE — ED Notes (Signed)
Pt reports unable to feel on the R side of the face, and decreased sensation in the R arm. L leg unable to move and no sensation.

## 2022-06-27 NOTE — H&P (Signed)
History and Physical    Nicholas [name] [id number] DOB: September 07, 1961 DOA: 06/27/2022  PCP: Nicholas Holms, NP  Patient coming from: Home  Chief Complaint: Multiple complaints  HPI: Nicholas [name] is a 61 y.o. male with medical history significant of asthma, bipolar disorder, schizophrenia, chronic pain, seizures/pseudoseizures, conversion disorder, cervical myelopathy, GERD, hypertension, hyperlipidemia, hyperthyroidism, hemiplegic migraine, TIA, insulin-dependent type 2 diabetes, CKD stage IIIa, BPH presented to the ED with altered speech, right arm weakness, right face decreased sensation, left leg weakness and numbness.  Vital signs stable.  Labs significant for WBC 11.2, bicarb 17, anion gap 11, BUN 23, creatinine 1.9 (baseline 1.3), glucose 225.  Blood ethanol level undetectable.  Lactic acid 3.2.  UDS negative.  UA without signs of infection.  Brain MRI negative for stroke. Patient was given Tylenol, Benadryl, Compazine, 500 cc LR bolus, Keppra, 1 L normal saline bolus. Neurology will consult and recommended MRI of lumbar spine.  Patient states his symptoms started at 4 AM this morning.  He is endorsing left lower extremity weakness and numbness, reports pins and needle sensation at the bottom of his foot.  Denies saddle anesthesia or bowel/bladder dysfunction.  Endorsing chronic low back pain.  Endorsing right upper extremity weakness, numbness, and tingling since this morning.  Also endorsing right-sided facial numbness and right-sided headaches since this morning.  In addition, endorsing slurred speech and confusion.  States he checked his blood sugar at home and the meter read "high."  States his PCP gave him a steroid injection yesterday for headaches and advised him to double up on the dose of his nighttime insulin.  Review of Systems:  Review of Systems  All other systems reviewed and are negative.   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   Feeling of incomplete bladder emptying    Frequency of urination    Gastric ulcer    GERD (gastroesophageal reflux disease)    Hypertension    Hyperthyroidism    NO MEDS    Migraine    Seizures ([facility])    TIA (transient ischemic attack)    Type 2 diabetes mellitus ([place])     Past Surgical History:  Procedure Laterality Date   ABDOMINAL SURGERY     ANTERIOR CERVICAL DECOMP/DISCECTOMY FUSION  2007   C4  --  C6   CERVICAL FUSION     CHOLECYSTECTOMY     COLONOSCOPY WITH PROPOFOL Left 11/02/2020   Procedure: COLONOSCOPY WITH PROPOFOL;  Surgeon: Arta Silence, MD;  Location: [facility];  Service: Endoscopy;  Laterality: Left;   CYSTOSCOPY N/A 11/09/2013   Procedure: CYSTOSCOPY FLEXIBLE;  Surgeon: Irine Seal, MD;  Location: [facility];  Service: Urology;  Laterality: N/A;   EPIDIDYMECTOMY Left 11/09/2013   Procedure:  LEFT EPIDIDYMECTOMY;  Surgeon: Irine Seal, MD;  Location: [facility];  Service: Urology;  Laterality: Left;  POSSIBLE OUTPATIENT WITH OBSERVATION   EXCISION LIPOMA LEFT SHOULDER  2004 (APPROX)   MULTIPLE CYST REMOVED FROM CHEST  AGE 46   OTHER SURGICAL HISTORY     hemorroid surgery    TESTICLE REMOVAL Left    TONSILLECTOMY  reports that he quit smoking about 29 years ago. His smoking use included cigarettes. He has a 3.75 pack-year smoking history. He has never used smokeless tobacco. He reports that he does not drink alcohol and does not use drugs.  Allergies  Allergen Reactions   Finasteride Other (See Comments)    Break out   Levothyroxine Anaphylaxis and Cough    Chronic cough   Nsaids Other (See Comments)    D/t gastric ulcer   Amoxicillin Itching and Other (See Comments)    THRUSH Causes sores in mouth   Ampicillin Other  (See Comments)    THRUSH   Penicillins Itching and Other (See Comments)    THRUSH- Causes sores in mouth   Strawberry Extract Swelling    LIPS SWELL   Asa [Aspirin] Other (See Comments)    Bleeding    Bactrim [Sulfamethoxazole-Trimethoprim] Hives, Itching and Other (See Comments)    GI upset   Dapagliflozin     Other reaction(s): Other (See Comments) Allergic to steroids and had mouth broke out.    Depakote [Divalproex Sodium]     Causes double vision and speech problems    Dilantin [Phenytoin] Other (See Comments)    Severe skin peeling   Methocarbamol Other (See Comments)    dizziness   Other     Med for prostate that startes with a M- Broke out the insid eof mouth, split the tongue, and caused throudh   Risperidone And Related Other (See Comments)    Hallucinations    Sitagliptin Other (See Comments)    pancreatitis   Strawberry (Diagnostic) Itching and Swelling   Sulfa Antibiotics Other (See Comments)    Affects thyroid    Tolmetin Other (See Comments)    D/t gastric ulcer   Ultram [Tramadol Hcl] Other (See Comments)    D/t gastric ulcer   Buprenorphine Itching, Rash and Other (See Comments)    "Patches broke me out in red patches-  caused a fever, also"   Oatmeal Hives and Other (See Comments)    White spots/sores in mouth    Family History  Problem Relation Age of Onset   Heart failure Mother    Diabetes Mother    Hypertension Mother    Cirrhosis Father    Heart failure Father    Kidney disease Father    Heart failure Brother    Heart disease Brother    Heart disease Brother     Prior to Admission medications   Medication Sig Start Date End Date Taking? Authorizing Provider  albuterol (PROVENTIL HFA;VENTOLIN HFA) 108 (90 Base) MCG/ACT inhaler Inhale 1-2 puffs into the lungs every 6 (six) hours as needed for wheezing or shortness of breath. 11/15/18  Yes Rancour, Annie Main, MD  atorvastatin (LIPITOR) 80 MG tablet Take 1 tablet (80 mg total) by mouth at  bedtime. 09/05/20  Yes Danford, Suann Larry, MD  budesonide-formoterol (SYMBICORT) 80-4.5 MCG/ACT inhaler Inhale 2 puffs into the lungs daily.   Yes [provider]  Eszopiclone 3 MG TABS Take 3 mg by mouth at bedtime. 06/25/22  Yes [provider]  gabapentin (NEURONTIN) 600 MG tablet Take 1 tablet (600 mg total) by mouth daily at 6 (six) AM. Patient taking differently: Take 600 mg by mouth every 6 (six) hours. 12/06/20  Yes Swayze, Ava, DO  HUMALOG KWIKPEN 100 UNIT/ML KwikPen Inject 0-16 Units into the skin 4 (four) times daily as needed (high blood sugar). Inject into the skin up to 4 times a day, per sliding scale:  BGL 160 or greater = 10 units; 200 or greater = 14 units 11/05/20  Yes [provider]  lactulose (CHRONULAC) 10 GM/15ML solution Take 30 g by mouth daily as needed for moderate constipation. 05/12/22  Yes [provider]  latanoprost (XALATAN) 0.005 % ophthalmic solution Place 1 drop into both eyes at bedtime.  07/04/20  Yes [provider]  lurasidone (LATUDA) 20 MG TABS tablet Take 1 tablet by mouth at bedtime. 06/14/22  Yes [provider]  metFORMIN (GLUCOPHAGE) 1000 MG tablet Take 1,000 mg by mouth 2 (two) times daily. 11/04/20  Yes [provider]  nortriptyline (PAMELOR) 50 MG capsule Take 100 mg by mouth at bedtime.    Yes [provider]  oxyCODONE-acetaminophen (PERCOCET) 10-325 MG tablet Take 1 tablet by mouth every 4 (four) hours as needed for pain. Patient taking differently: Take 1 tablet by mouth every 8 (eight) hours as needed for pain. 02/07/21  Yes Regal, Kirstie Peri, DPM  SUMAtriptan (IMITREX) 100 MG tablet Take 1 tablet (100 mg total) by mouth daily as needed for migraine or headache. May repeat in 2 hours if headache persists or recurs. 07/25/20  Yes Leatha Gilding, MD  tamsulosin (FLOMAX) 0.4 MG CAPS capsule Take 0.4 mg by mouth daily as needed (prostate).   Yes [provider]  timolol  (TIMOPTIC) 0.5 % ophthalmic solution Place 1 drop into both eyes daily. 04/06/20  Yes [provider]  TOUJEO MAX SOLOSTAR 300 UNIT/ML Solostar Pen Inject 25 Units into the skin at bedtime. 06/17/22  Yes [provider]  traZODone (DESYREL) 100 MG tablet Take 100 mg by mouth at bedtime. 06/25/22  Yes [provider]  verapamil (CALAN-SR) 120 MG CR tablet Take 120 mg by mouth daily. 05/09/22  Yes [provider]  zonisamide (ZONEGRAN) 100 MG capsule Take 200 mg by mouth 2 (two) times daily. 04/10/22  Yes [provider]  clopidogrel (PLAVIX) 75 MG tablet Take 1 tablet (75 mg total) by mouth daily. Patient not taking: Reported on 06/27/2022 07/05/20   Azucena Fallen, MD  dicyclomine (BENTYL) 20 MG tablet Take 1 tablet (20 mg total) by mouth 2 (two) times daily. Patient not taking: Reported on 06/27/2022 04/26/22   Wynetta Fines, MD  doxycycline (VIBRA-TABS) 100 MG tablet Take 1 tablet (100 mg total) by mouth 2 (two) times daily. Patient not taking: Reported on 06/27/2022 02/07/21   Lenn Sink, DPM  fenofibrate (TRICOR) 145 MG tablet Take 1 tablet (145 mg total) by mouth daily. Patient not taking: Reported on 06/27/2022 11/02/20   Swayze, Ava, DO  oxyCODONE-acetaminophen (PERCOCET) 10-325 MG tablet Take 1.5 tablets by mouth every 6 (six) hours as needed for pain. Patient not taking: Reported on 06/27/2022 10/24/20   [provider]  predniSONE (DELTASONE) 20 MG tablet Take 20 mg by mouth 2 (two) times daily. 06/26/22   [provider]    Physical Exam: Vitals:   06/27/22 1315 06/27/22 1430 06/27/22 1636 06/27/22 1820  BP: 109/70 123/80 136/83 118/70  Pulse: 69 92 86 93  Resp: 12 16 16 16   Temp:   97.7 F (36.5 C)   SpO2: 97% 97% 96% 96%  Weight:      Height:        Physical Exam Vitals reviewed.  Constitutional:      General: He is not in acute distress. HENT:     Head: Normocephalic and atraumatic.  Eyes:     Extraocular  Movements: Extraocular movements  intact.  Cardiovascular:     Rate and Rhythm: Normal rate and regular rhythm.     Pulses: Normal pulses.  Pulmonary:     Effort: Pulmonary effort is normal. No respiratory distress.     Breath sounds: No wheezing or rales.  Abdominal:     General: Bowel sounds are normal.     Palpations: Abdomen is soft.     Tenderness: There is no abdominal tenderness.  Musculoskeletal:        General: No swelling or tenderness.     Cervical back: Normal range of motion.  Skin:    General: Skin is warm and dry.  Neurological:     Mental Status: He is alert.     Cranial Nerves: No cranial nerve deficit.     Comments: Diminished sensation to light touch on the entire right side of the face including forehead. No right upper extremity weakness appreciated and sensation to light touch intact. Left lower extremity very weak and numb.  He is not able to move this leg at all.      Labs on Admission: I have personally reviewed following labs and imaging studies  CBC: Recent Labs  Lab 06/27/22 0828 06/27/22 0836  WBC 11.2*  --   NEUTROABS 9.7*  --   HGB 13.4 14.6  HCT 41.5 43.0  MCV 85.6  --   PLT 218  --    Basic Metabolic Panel: Recent Labs  Lab 06/27/22 0828 06/27/22 0836  NA 136 140  K 4.8 4.7  CL 108 108  CO2 17*  --   GLUCOSE 225* 219*  BUN 23* 25*  CREATININE 1.90* 1.80*  CALCIUM 9.6  --    GFR: Estimated Creatinine Clearance: 46.5 mL/min (A) (by C-G formula based on SCr of 1.8 mg/dL (H)). Liver Function Tests: Recent Labs  Lab 06/27/22 0828  AST 28  ALT 33  ALKPHOS 67  BILITOT 0.3  PROT 7.0  ALBUMIN 4.2   No results for input(s): "LIPASE", "AMYLASE" in the last 168 hours. No results for input(s): "AMMONIA" in the last 168 hours. Coagulation Profile: Recent Labs  Lab 06/27/22 0828  INR 1.2   Cardiac Enzymes: No results for input(s): "CKTOTAL", "CKMB", "CKMBINDEX", "TROPONINI" in the last 168 hours. BNP (last 3 results) No  results for input(s): "PROBNP" in the last 8760 hours. HbA1C: No results for input(s): "HGBA1C" in the last 72 hours. CBG: Recent Labs  Lab 06/27/22 0827  GLUCAP 218*   Lipid Profile: No results for input(s): "CHOL", "HDL", "LDLCALC", "TRIG", "CHOLHDL", "LDLDIRECT" in the last 72 hours. Thyroid Function Tests: No results for input(s): "TSH", "T4TOTAL", "FREET4", "T3FREE", "THYROIDAB" in the last 72 hours. Anemia Panel: No results for input(s): "VITAMINB12", "FOLATE", "FERRITIN", "TIBC", "IRON", "RETICCTPCT" in the last 72 hours. Urine analysis:    Component Value Date/Time   COLORURINE YELLOW 06/27/2022 1544   APPEARANCEUR CLEAR 06/27/2022 1544   LABSPEC 1.014 06/27/2022 1544   PHURINE 5.0 06/27/2022 1544   GLUCOSEU >=500 (A) 06/27/2022 1544   HGBUR NEGATIVE 06/27/2022 1544   BILIRUBINUR NEGATIVE 06/27/2022 1544   KETONESUR NEGATIVE 06/27/2022 1544   PROTEINUR NEGATIVE 06/27/2022 1544   UROBILINOGEN 0.2 08/29/2015 1125   NITRITE NEGATIVE 06/27/2022 1544   LEUKOCYTESUR NEGATIVE 06/27/2022 1544    Radiological Exams on Admission: I have personally reviewed images MR BRAIN WO CONTRAST  Result Date: 06/27/2022 CLINICAL DATA:  Aphasia EXAM: MRI HEAD WITHOUT CONTRAST TECHNIQUE: Multiplanar, multiecho pulse sequences of the brain and surrounding structures were obtained without intravenous  contrast. COMPARISON:  Head CT from earlier the same day and brain MRI 03/13/2021 FINDINGS: Brain: No acute infarction, hemorrhage, hydrocephalus, extra-axial collection or mass lesion. No white matter disease or atrophy Vascular: Major flow voids are preserved Skull and upper cervical spine: Normal marrow signal Sinuses/Orbits: Negative. Other: Motion artifact, reportedly due to uncontrolled snoring. IMPRESSION: Negative motion degraded brain MRI. Electronically Signed   By: Jorje Guild M.D.   On: 06/27/2022 12:29   CT HEAD WO CONTRAST  Result Date: 06/27/2022 CLINICAL DATA:  Weakness.   Possible stroke. EXAM: CT HEAD WITHOUT CONTRAST TECHNIQUE: Contiguous axial images were obtained from the base of the skull through the vertex without intravenous contrast. RADIATION DOSE REDUCTION: This exam was performed according to the departmental dose-optimization program which includes automated exposure control, adjustment of the mA and/or kV according to patient size and/or use of iterative reconstruction technique. COMPARISON:  06/25/2021 FINDINGS: Brain: No evidence of acute infarction, hemorrhage, hydrocephalus, extra-axial collection or mass lesion/mass effect. Vascular: No hyperdense vessel or unexpected calcification. Skull: Normal. Negative for fracture or focal lesion. Sinuses/Orbits: No acute finding. Other: None. IMPRESSION: No acute findings. Electronically Signed   By: Marin Olp M.D.   On: 06/27/2022 09:08    EKG: Independently reviewed.  Sinus rhythm, borderline T wave abnormalities.  No significant change since prior tracing.  Assessment and Plan  L leg weakness and numbness, right facial numbness, right-sided headache Brain MRI is negative for stroke.   -Neurology consulted, appreciate recommendations -Stat MRI of lumbar spine  AKI on CKD stage IIIa Possibly prerenal from dehydration. -Continue gentle IV fluid hydration -Monitor renal function -Avoid nephrotoxic agents  Lactic acidosis ?Seizure.  No infectious signs or symptoms. -Patient received fluid boluses in the ED, repeat lactate  Metabolic acidosis Likely due to AKI and lactic acidosis. -Continue gentle IV fluid hydration -Repeat labs and bicarb supplement if not improving  Borderline leukocytosis No infectious signs or symptoms. -Repeat CBC in a.m.  Asthma Stable, no wheezing. -Continue home inhalers  Bipolar disorder Schizophrenia -Continue home meds  Seizure disorder ?Seizure activity given elevated lactate.  Patient was given Keppra in the ED. -Continue zonisamide -Neurology  consulted -Seizure precautions  Hypertension Stable. -Continue verapamil  Hyperlipidemia -Continue Lipitor  Poorly controlled insulin-dependent type 2 diabetes Blood glucose 225.  A1c was 8.0 in December 2021. -Repeat A1c -Continue home basal insulin -Moderate sliding scale insulin  Chronic neuropathic pain -Continue gabapentin  DVT prophylaxis: Lovenox Code Status: Full Code Family Communication: No family available at this time. Consults called: Neurology Level of care: Med-Surg Admission status: It is my clinical opinion that referral for OBSERVATION is reasonable and necessary in this patient based on the above information provided. The aforementioned taken together are felt to place the patient at high risk for further clinical deterioration. However, it is anticipated that the patient may be medically stable for discharge from the hospital within 24 to 48 hours.   Shela Leff MD [facility]  If 7PM-7AM, please contact night-coverage [email or web address]  06/27/2022, 7:49 PM

## 2022-06-27 NOTE — ED Notes (Signed)
Patient transported to MRI 

## 2022-06-27 NOTE — ED Notes (Signed)
Pt given [place] sandwich and drink. Denies any other needs at this time.

## 2022-06-27 NOTE — ED Triage Notes (Signed)
Pt BIBA from home for reports of diabetic problems with high glucose. Pt started on steroid yesterday and was told that sugar will read high and to continue Metformin. Pt told EMS that his speech is off. Reports that he can't control his tongue. Pt attempted to have seizure with EMS but was immediately responsive.

## 2022-06-27 NOTE — Consult Note (Signed)
Neurology Consultation Reason for Consult: Persistent left leg weakness Requesting Physician: Shela Leff  CC: Left leg weakness  History is obtained from: Patient, chart review and ED provider  HPI: Nicholas [name] is a 61 y.o. male with a past medical history significant for hemiplegic migraine, obstructive sleep apnea, conversion disorder, PNES, diabetes, hypertension, cervical myelopathy, bipolar disorder, chronic pain, former cigarette use (quit 12/05/2009).  He reports he had fallen asleep on his couch, laying on his right side as is usual for him with his left leg stacked on top of his right leg, no clear compression of the left leg.  When he awoke he had severe numbness and tingling of the left leg and weakness.  Initially he was also reporting right arm weakness and slurred speech to ED providers, however the symptoms resolved but the left leg weakness is persistent and neurology is asked to evaluate  He notes he was started on prednisone recently and is concerned that this is affecting his sugar, noting that it was 400 at home  ROS: He additionally reports a pulling of his single testicle when he pees, right facial numbness, feeling cold but no fevers, prior baseline left-sided weakness from a reported stroke (which he later denies), intact bowel and bladder sensation, intact bowel and bladder function  Past Medical History:  Diagnosis Date   Asthma    Bipolar 1 disorder ([place])    Borderline glaucoma    Chronic pain    Epididymal pain    LEFT   Epilepsy, 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3---  LAST SEIZURE 1 WK AGO (APPROX ,  10-31-2013)   NO NEUROLOGIST---  PT SEES PCP  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   Feeling of incomplete bladder emptying    Frequency of urination    Gastric ulcer    GERD (gastroesophageal reflux disease)    Hypertension    Hyperthyroidism    NO MEDS    Migraine    Seizures ([place])    TIA (transient ischemic attack)    Type 2 diabetes mellitus ([place])    Past Surgical History:   Procedure Laterality Date   ABDOMINAL SURGERY     ANTERIOR CERVICAL DECOMP/DISCECTOMY FUSION  2007   C4  --  C6   CERVICAL FUSION     CHOLECYSTECTOMY     COLONOSCOPY WITH PROPOFOL Left 11/02/2020   Procedure: COLONOSCOPY WITH PROPOFOL;  Surgeon: Arta Silence, MD;  Location: [place];  Service: Endoscopy;  Laterality: Left;   CYSTOSCOPY N/A 11/09/2013   Procedure: CYSTOSCOPY FLEXIBLE;  Surgeon: Irine Seal, MD;  Location: [facility];  Service: Urology;  Laterality: N/A;   EPIDIDYMECTOMY Left 11/09/2013   Procedure:  LEFT EPIDIDYMECTOMY;  Surgeon: Irine Seal, MD;  Location: [facility];  Service: Urology;  Laterality: Left;  POSSIBLE OUTPATIENT WITH OBSERVATION   EXCISION LIPOMA LEFT SHOULDER  2004 (APPROX)   MULTIPLE CYST REMOVED FROM CHEST  AGE 18   OTHER SURGICAL HISTORY     hemorroid surgery    TESTICLE REMOVAL Left    TONSILLECTOMY     Current Outpatient Medications  Medication Instructions   albuterol (PROVENTIL HFA;VENTOLIN HFA) 108 (90 Base) MCG/ACT inhaler 1-2 puffs, Inhalation, Every 6 hours PRN   atorvastatin (LIPITOR) 80 mg, Oral, Daily at bedtime   budesonide-formoterol (SYMBICORT) 80-4.5 MCG/ACT inhaler 2 puffs, Inhalation, Daily   clopidogrel (PLAVIX) 75 mg, Oral, Daily   dicyclomine (BENTYL) 20 mg, Oral, 2 times daily   doxycycline (VIBRA-TABS) 100 mg, Oral, 2 times daily  Eszopiclone 3 mg, Oral, Daily at bedtime   fenofibrate (TRICOR) 145 mg, Oral, Daily   gabapentin (NEURONTIN) 600 mg, Oral, Daily   HumaLOG KwikPen 0-16 Units, Subcutaneous, 4 times daily PRN, Inject into the skin up to 4 times a day, per sliding scale: BGL 160 or greater = 10 units; 200 or greater = 14 units   lactulose (CHRONULAC) 30 g, Oral, Daily PRN   latanoprost (XALATAN) 0.005 % ophthalmic solution 1 drop, Both Eyes, Daily at bedtime   lurasidone (LATUDA) 20 MG TABS tablet 1 tablet, Oral, Daily at bedtime   metFORMIN (GLUCOPHAGE) 1,000 mg, Oral, 2 times  daily   nortriptyline (PAMELOR) 100 mg, Oral, Daily at bedtime   oxyCODONE-acetaminophen (PERCOCET) 10-325 MG tablet 1.5 tablets, Every 6 hours PRN   oxyCODONE-acetaminophen (PERCOCET) 10-325 MG tablet 1 tablet, Oral, Every 4 hours PRN   predniSONE (DELTASONE) 20 mg, Oral, 2 times daily   SUMAtriptan (IMITREX) 100 mg, Oral, Daily PRN, May repeat in 2 hours if headache persists or recurs.   tamsulosin (FLOMAX) 0.4 mg, Oral, Daily PRN   timolol (TIMOPTIC) 0.5 % ophthalmic solution 1 drop, Both Eyes, Daily   Toujeo Max SoloStar 25 Units, Subcutaneous, Daily at bedtime   traZODone (DESYREL) 100 mg, Oral, Daily at bedtime   verapamil (CALAN-SR) 120 mg, Oral, Daily   zonisamide (ZONEGRAN) 200 mg, Oral, 2 times daily     Family History  Problem Relation Age of Onset   Heart failure Mother    Diabetes Mother    Hypertension Mother    Cirrhosis Father    Heart failure Father    Kidney disease Father    Heart failure Brother    Heart disease Brother    Heart disease Brother     Social History:  reports that he quit smoking about 29 years ago. His smoking use included cigarettes. He has a 3.75 pack-year smoking history. He has never used smokeless tobacco. He reports that he does not drink alcohol and does not use drugs.   Exam: Current vital signs: BP 118/72   Pulse 91   Temp 98.3 F (36.8 C) (Oral)   Resp 18   Ht 5' 7" (1.702 m)   Wt 89.4 kg   SpO2 97%   BMI 30.85 kg/m  Vital signs in last 24 hours: Temp:  [97.6 F (36.4 C)-98.8 F (37.1 C)] 98.3 F (36.8 C) (07/22 2000) Pulse Rate:  [69-94] 91 (07/22 2000) Resp:  [12-19] 18 (07/22 2000) BP: (109-136)/(70-87) 118/72 (07/22 2000) SpO2:  [91 %-97 %] 97 % (07/22 2000) Weight:  [89.4 kg] 89.4 kg (07/22 0847)   Physical Exam  Constitutional: Appears well-developed and well-nourished.  Psych: Overall calm and cooperative Eyes: No scleral injection HENT: No oropharyngeal obstruction.  MSK: Reports swelling of the left  lower leg though this is minimal on my evaluation.  Jumps to palpation of the spine in numerous points of the cervical, thoracic and lumbar spine, which he reports is chronic cardiovascular: Normal rate and regular rhythm. Respiratory: Effort normal, non-labored breathing GI: Soft.  No distension. There is no tenderness.  Well-healed surgical scar Skin: Warm dry and intact visible skin  Neuro: Mental Status: Patient is awake, alert, oriented to person, place, month, year, and situation. Patient is able to give a clear and coherent history. No signs of aphasia or neglect Cranial Nerves: II: Visual Fields are full. Pupils are equal, round, and reactive to light.   III,IV, VI: EOMI without ptosis or diploplia.  V: Facial  sensation is reportedly reduced on the right VII: Facial movement is symmetric.  VIII: hearing is intact to voice X: Uvula elevates symmetrically XI: Shoulder shrug is symmetric. XII: tongue is midline without atrophy or fasciculations.  Motor: Tone is normal.  He has mild giveway weakness of the bilateral deltoids but otherwise is 5/5 in the upper extremities.  He is 5/5 in the right lower extremity.  The left lower extremity he does not participate in confrontational strength testing.  He is able to take the heel into the bed when I attempt to do Hoovers/reverse Hoover's.  He makes no effort to move the leg voluntarily, but when I given the very minimal amount of assistance he is able to move the leg into the bed Sensory: Reports a stocking distribution of loss of sensation in the left leg Deep Tendon Reflexes: 3+ and symmetric in the brachioradialis bilaterally.  3+ in the right patellar but 2+ in the left patellar, 2+ and brisk in the right Achilles, absent in the left Achilles (though did not attempt distraction) Cerebellar: Finger-nose intact bilaterally.  Heel-to-shin intact on the right Gait:  Deferred given weakness   I have reviewed labs in epic and the results  pertinent to this consultation are:  Basic Metabolic Panel: Recent Labs  Lab 06/27/22 0828 06/27/22 0836  NA 136 140  K 4.8 4.7  CL 108 108  CO2 17*  --   GLUCOSE 225* 219*  BUN 23* 25*  CREATININE 1.90* 1.80*  CALCIUM 9.6  --     CBC: Recent Labs  Lab 06/27/22 0828 06/27/22 0836  WBC 11.2*  --   NEUTROABS 9.7*  --   HGB 13.4 14.6  HCT 41.5 43.0  MCV 85.6  --   PLT 218  --     Coagulation Studies: Recent Labs    06/27/22 0828  LABPROT 15.2  INR 1.2      I have reviewed the images obtained:  MRI brain personally reviewed, negative for acute intracranial process   Impression: Certainly there is a significant amount of functional overlay on this patient's examination, which makes evaluation of his to deficit challenging, for example the sensory deficit he describes does not fit a nerve distribution.  However he does have newly asymmetric reflexes as compared to his last documented neurological examination, with noticeably diminished reflexes in the left patella and Achilles.  This suggests a peripheral nerve localization.  No MRI lumbar prophylaxis protocol is available here so will obtain standard MRI lumbar spine to evaluate.  Possibility of disc compression.  Story of this happening after laying on the couch raises the possibility of a peripheral nerve compression such as peroneal nerve compression at the level of the fibular head, but he does not clearly describe anything pressing on the fibula when he was laying down (and of course the motor and sensory symptoms do not exactly match but again his examination is confounded by functional overlay).  Given his diabetes, diabetic amyotrophy is also a consideration, less likely diabetic muscle infarction.  Recommendations: -MRI lumbar spine -PT/OT -ESR/CRP, CK -If MRI lumbar spine is unrevealing and his symptoms persist for greater than 2 to 3 weeks, outpatient EMG/nerve conduction study would be indicated for further  work-up -Neurology will continue to follow  [place] [phone] Available 7 AM to 7 PM, outside these hours please contact Neurologist on call listed on AMION

## 2022-06-28 DIAGNOSIS — R29898 Other symptoms and signs involving the musculoskeletal system: Secondary | ICD-10-CM | POA: Diagnosis not present

## 2022-06-28 DIAGNOSIS — N179 Acute kidney failure, unspecified: Secondary | ICD-10-CM | POA: Diagnosis not present

## 2022-06-28 DIAGNOSIS — N1832 Chronic kidney disease, stage 3b: Secondary | ICD-10-CM | POA: Diagnosis not present

## 2022-06-28 DIAGNOSIS — E872 Acidosis, unspecified: Secondary | ICD-10-CM

## 2022-06-28 DIAGNOSIS — I10 Essential (primary) hypertension: Secondary | ICD-10-CM | POA: Diagnosis not present

## 2022-06-28 DIAGNOSIS — G40909 Epilepsy, unspecified, not intractable, without status epilepticus: Secondary | ICD-10-CM

## 2022-06-28 LAB — BASIC METABOLIC PANEL
Anion gap: 7 (ref 5–15)
BUN: 20 mg/dL (ref 6–20)
CO2: 21 mmol/L — ABNORMAL LOW (ref 22–32)
Calcium: 9.3 mg/dL (ref 8.9–10.3)
Chloride: 113 mmol/L — ABNORMAL HIGH (ref 98–111)
Creatinine, Ser: 1.41 mg/dL — ABNORMAL HIGH (ref 0.61–1.24)
GFR, Estimated: 57 mL/min — ABNORMAL LOW (ref >60)
Glucose, Bld: 131 mg/dL — ABNORMAL HIGH (ref 70–99)
Potassium: 3.8 mmol/L (ref 3.5–5.1)
Sodium: 141 mmol/L (ref 135–145)

## 2022-06-28 LAB — PHOSPHORUS: Phosphorus: 3 mg/dL (ref 2.5–4.6)

## 2022-06-28 LAB — CBG MONITORING, ED
Glucose-Capillary: 172 mg/dL — ABNORMAL HIGH (ref 70–99)
Glucose-Capillary: 116 mg/dL — ABNORMAL HIGH (ref 70–99)
Glucose-Capillary: 112 mg/dL — ABNORMAL HIGH (ref 70–99)

## 2022-06-28 LAB — MAGNESIUM: Magnesium: 1.7 mg/dL (ref 1.7–2.4)

## 2022-06-28 LAB — C-REACTIVE PROTEIN: CRP: 0.6 mg/dL (ref <1.0)

## 2022-06-28 LAB — RPR: RPR Ser Ql: NONREACTIVE

## 2022-06-28 LAB — GLUCOSE, CAPILLARY
Glucose-Capillary: 185 mg/dL — ABNORMAL HIGH (ref 70–99)
Glucose-Capillary: 127 mg/dL — ABNORMAL HIGH (ref 70–99)

## 2022-06-28 LAB — CK: Total CK: 168 U/L (ref 49–397)

## 2022-06-28 LAB — HIV ANTIBODY (ROUTINE TESTING W REFLEX): HIV Screen 4th Generation wRfx: NONREACTIVE

## 2022-06-28 LAB — SEDIMENTATION RATE: Sed Rate: 3 mm/hr (ref 0–16)

## 2022-06-28 MED ORDER — SUMATRIPTAN SUCCINATE 100 MG PO TABS: 100.0000 mg | ORAL_TABLET | Freq: Every day | ORAL | Status: DC | PRN

## 2022-06-28 MED ORDER — TAMSULOSIN HCL 0.4 MG PO CAPS: 0.4000 mg | ORAL_CAPSULE | Freq: Every day | ORAL | Status: DC | PRN

## 2022-06-28 MED ORDER — FENOFIBRATE 54 MG PO TABS
54.0000 mg | ORAL_TABLET | Freq: Every day | ORAL | Status: DC
  Administered 2022-06-28 – 2022-07-01 (×4): 54 mg via ORAL
  Filled 2022-06-28 (×4): qty 1

## 2022-06-28 MED ORDER — OXYCODONE HCL 5 MG PO TABS
5.0000 mg | ORAL_TABLET | Freq: Three times a day (TID) | ORAL | Status: DC | PRN
  Administered 2022-06-29 – 2022-07-01 (×5): 5 mg via ORAL
  Filled 2022-06-28 (×5): qty 1

## 2022-06-28 MED ORDER — ZOLPIDEM TARTRATE 5 MG PO TABS
5.0000 mg | ORAL_TABLET | Freq: Every evening | ORAL | Status: DC | PRN
  Administered 2022-06-30: 5 mg via ORAL
  Filled 2022-06-28: qty 1

## 2022-06-28 MED ORDER — CLOPIDOGREL BISULFATE 75 MG PO TABS
75.0000 mg | ORAL_TABLET | Freq: Every day | ORAL | Status: DC
  Administered 2022-06-28 – 2022-07-01 (×4): 75 mg via ORAL
  Filled 2022-06-28 (×4): qty 1

## 2022-06-28 MED ORDER — LACTULOSE 10 GM/15ML PO SOLN
30.0000 g | Freq: Every day | ORAL | Status: DC | PRN
Start: 2022-06-28 — End: 2022-07-02
  Administered 2022-06-29: 30 g via ORAL
  Filled 2022-06-28: qty 45

## 2022-06-28 MED ORDER — SODIUM CHLORIDE 0.9 % IV SOLN
INTRAVENOUS | Status: AC
Start: 2022-06-28 — End: 2022-06-29

## 2022-06-28 MED ORDER — VERAPAMIL HCL ER 120 MG PO TBCR
120.0000 mg | EXTENDED_RELEASE_TABLET | Freq: Every day | ORAL | Status: DC
  Administered 2022-06-29 – 2022-07-01 (×3): 120 mg via ORAL
  Filled 2022-06-28 (×3): qty 1

## 2022-06-28 MED ORDER — OXYCODONE-ACETAMINOPHEN 10-325 MG PO TABS: 1.0000 | ORAL_TABLET | Freq: Three times a day (TID) | ORAL | Status: DC | PRN

## 2022-06-28 MED ORDER — OXYCODONE-ACETAMINOPHEN 5-325 MG PO TABS
1.0000 | ORAL_TABLET | Freq: Three times a day (TID) | ORAL | Status: DC | PRN
  Administered 2022-06-28 – 2022-07-01 (×7): 1 via ORAL
  Filled 2022-06-28 (×7): qty 1

## 2022-06-28 NOTE — Care Management Obs Status (Signed)
MEDICARE OBSERVATION STATUS NOTIFICATION   Patient Details  Name: Nicholas [name] MRN: [id number] Date of Birth: Feb 15, 1961   Medicare Observation Status Notification Given:  Yes    Lockie Pares, RN 06/28/2022, 11:21 AM

## 2022-06-28 NOTE — Evaluation (Signed)
Physical Therapy Evaluation Patient Details Name: Nicholas [name] MRN: [id number] DOB: 02-Nov-1961 Today's Date: 06/28/2022  History of Present Illness  Pt is a 61 y.o. male who presented 06/27/22 with altered speech, R arm weakness,  right face decreased sensation, left leg weakness and numbness. MRI of lumbar spine-unchanged mild lower lumbar degenerative disc disease without spinal canal stenosis and unchanged mild right L4-5 neural foraminal stenosis. MRI of brain was negative. PMH: asthma, bipolar disorder, schizophrenia, chronic pain, seizures/pseudoseizures, conversion disorder, cervical myelopathy, GERD, hypertension, hyperlipidemia, hyperthyroidism, hemiplegic migraine, TIA, insulin-dependent type 2 diabetes, CKD stage IIIa, BPH   Clinical Impression  Pt presents with condition above and deficits mentioned below, see PT Problem List. PTA, he was mod I with a SPC, living alone in a 1-level house. Currently, pt is demonstrating deficits in L lower extremity sensation and strength. Pt's L leg strength appeared inconsistent at times as he was able to lift it onto the bed for transition sit > supine and pull his knee up to his chest spontaneously, but pt had difficulty performing heel slides when cued or lifting his leg to advance it with gait initially. Pt was able to progress from a step-to gait pattern to a step-through pattern with improved L knee stability as distance progressed. Pt is currently requiring minA for transfers and gait with a RW. Pt is interested in AIR, thus recommending AIR as he is motivated to improve, but if he does not qualify then recommend SNF unless he progresses quickly to ideally return home with [facility] services. Will continue to follow acutely.     Recommendations for follow up therapy are one component of a multi-disciplinary discharge planning process, led by the attending physician.  Recommendations may be updated based on patient status, additional functional criteria and  insurance authorization.  Follow Up Recommendations Acute inpatient rehab (3hours/day) (pending progress)      Assistance Recommended at Discharge Frequent or constant Supervision/Assistance  Patient can return home with the following  A little help with walking and/or transfers;A little help with bathing/dressing/bathroom;Assistance with cooking/housework;Assist for transportation;Help with stairs or ramp for entrance    Equipment Recommendations Other (comment);BSC/3in1 (tub bench)  Recommendations for Other Services  Rehab consult    Functional Status Assessment Patient has had a recent decline in their functional status and demonstrates the ability to make significant improvements in function in a reasonable and predictable amount of time.     Precautions / Restrictions Precautions Precautions: Fall Precaution Comments: L UE recent surgery with cast Restrictions Weight Bearing Restrictions: No      Mobility  Bed Mobility Overal bed mobility: Needs Assistance Bed Mobility: Supine to Sit, Sit to Supine     Supine to sit: Min guard, HOB elevated Sit to supine: Min guard, HOB elevated   General bed mobility comments: Extra time and effort to slide L leg off side of bed, pt rocking to gain momentum to lift legs and pivot to edge. Able to lift legs bil back into bed fairly quickly without assistance though.    Transfers Overall transfer level: Needs assistance Equipment used: Rolling walker (2 wheels) Transfers: Sit to/from Stand Sit to Stand: Min assist           General transfer comment: MinA to steady with transfer to stand from EOB, pt performing while only using his R leg and thereby keeping his L leg off the floor.    Ambulation/Gait Ambulation/Gait assistance: Min assist Gait Distance (Feet): 60 Feet Assistive device: Rolling walker (2 wheels)  Gait Pattern/deviations: Step-to pattern, Step-through pattern, Decreased step length - right, Decreased stance  time - left, Decreased dorsiflexion - left, Decreased weight shift to left, Knees buckling, Shuffle, Trunk flexed, Narrow base of support Gait velocity: reduced Gait velocity interpretation: <1.31 ft/sec, indicative of household ambulator   General Gait Details: Pt with slow, step-to pattern initially, sliding L leg to advance it as needed or holding it up off the ground. When pt would shift weight to L leg his knee would partially buckle but not fully. He was able to progress to step-through with improved but inconsistent L knee instability as distance progressed. Narrow placement of feet at times. MinA for stability and safety  Stairs            Wheelchair Mobility    Modified Rankin (Stroke Patients Only) Modified Rankin (Stroke Patients Only) Pre-Morbid Rankin Score: Slight disability Modified Rankin: Moderately severe disability     Balance Overall balance assessment: Needs assistance Sitting-balance support: No upper extremity supported, Feet supported Sitting balance-Leahy Scale: Good     Standing balance support: Bilateral upper extremity supported, During functional activity, Reliant on assistive device for balance Standing balance-Leahy Scale: Poor Standing balance comment: Reliant on bil UE support and up to minA                             Pertinent Vitals/Pain Pain Assessment Pain Assessment: Faces Faces Pain Scale: Hurts a little bit Pain Location: L knee Pain Descriptors / Indicators: Discomfort, Grimacing Pain Intervention(s): Limited activity within patient's tolerance, Monitored during session, Repositioned    Home Living Family/patient expects to be discharged to:: Private residence Living Arrangements: Alone Available Help at Discharge: Family;Friend(s) Type of Home: House         Home Layout: One level Home Equipment: Agricultural consultant (2 wheels);Cane - single point      Prior Function Prior Level of Function : Independent/Modified  Independent             Mobility Comments: reports using a cane mostly ADLs Comments: Reports he had been independent until recent     Hand Dominance   Dominant Hand: Right    Extremity/Trunk Assessment   Upper Extremity Assessment Upper Extremity Assessment: Defer to OT evaluation    Lower Extremity Assessment Lower Extremity Assessment: LLE deficits/detail LLE Deficits / Details: inconsistencies noted with AROM/strength as pt had pulled his knee up towards his chest quickly and smoothly when initially uncovering him but then when cued to perform heel slides he was barely able to with increased time, effort, and concentration; functional weakness noted intermittently; did not withdraw to painful/noxious stimuli distally and reported no sensation to touch LLE Sensation: decreased light touch LLE Coordination: decreased gross motor    Cervical / Trunk Assessment Cervical / Trunk Assessment: Normal  Communication   Communication: No difficulties  Cognition Arousal/Alertness: Awake/alert Behavior During Therapy: WFL for tasks assessed/performed Overall Cognitive Status: No family/caregiver present to determine baseline cognitive functioning                                 General Comments: Pt following cues, but reports yesterday he forgot his wife was deceased        General Comments      Exercises     Assessment/Plan    PT Assessment Patient needs continued PT services  PT Problem List Decreased strength;Decreased activity tolerance;Decreased  balance;Decreased mobility;Decreased coordination;Impaired sensation       PT Treatment Interventions DME instruction;Gait training;Stair training;Functional mobility training;Therapeutic activities;Therapeutic exercise;Balance training;Neuromuscular re-education;Cognitive remediation;Patient/family education    PT Goals (Current goals can be found in the Care Plan section)  Acute Rehab PT Goals Patient  Stated Goal: to go to rehab PT Goal Formulation: With patient Time For Goal Achievement: 07/12/22 Potential to Achieve Goals: Good    Frequency Min 4X/week     Co-evaluation               AM-PAC PT "6 Clicks" Mobility  Outcome Measure Help needed turning from your back to your side while in a flat bed without using bedrails?: A Little Help needed moving from lying on your back to sitting on the side of a flat bed without using bedrails?: A Little Help needed moving to and from a bed to a chair (including a wheelchair)?: A Little Help needed standing up from a chair using your arms (e.g., wheelchair or bedside chair)?: A Little Help needed to walk in hospital room?: A Little Help needed climbing 3-5 steps with a railing? : Total 6 Click Score: 16    End of Session Equipment Utilized During Treatment: Gait belt Activity Tolerance: Patient tolerated treatment well Patient left: with call bell/phone within reach;with bed alarm set;in bed Nurse Communication: Mobility status PT Visit Diagnosis: Unsteadiness on feet (R26.81);Other abnormalities of gait and mobility (R26.89);Muscle weakness (generalized) (M62.81);Difficulty in walking, not elsewhere classified (R26.2)    Time: [id number] PT Time Calculation (min) (ACUTE ONLY): 22 min   Charges:   PT Evaluation $PT Eval Moderate Complexity: 1 Mod          Moishe Spice, PT, DPT Acute Rehabilitation Services  Office: [phone]   Orvan Falconer 06/28/2022, 3:33 PM

## 2022-06-28 NOTE — TOC Initial Note (Signed)
Transition of Care [facility]) - Initial/Assessment Note    Patient Details  Name: Nicholas [name] MRN: [id number] Date of Birth: 02-22-1961  Transition of [facility]) CM/SW Contact:    Lockie Pares, RN Phone Number: 06/28/2022, 11:42 AM  Clinical Narrative:                  60 Yo patient with altered speech. He lives at home alone. He has a sister who is in rehab currently. Patient is agreeable to Home Health, wants the best one that works with his insurance. He also would have no problem going to SNF for rehab if deemed needed. PT and OT consults are pending. His sister is at [facility] in [place], he would prefer this facility.  May Be able to DC tomorrow PT and OT evaluation is pending at this time  CM will follow for needs, recommendations, and transitions.   Expected Discharge Plan: Home w Home Health Services Barriers to Discharge: Continued Medical Work up   Patient Goals and CMS Choice     Choice offered to / list presented to : Patient  Expected Discharge Plan and Services Expected Discharge Plan: Home w Home Health Services In-house Referral: Clinical Social Work Discharge Planning Services: CM Consult Post Acute Care Choice: Home Health, Skilled Nursing Facility, IP Rehab Living arrangements for the past 2 months: Apartment                                      Prior Living Arrangements/Services Living arrangements for the past 2 months: Apartment Lives with:: Self Patient language and need for interpreter reviewed:: Yes Do you feel safe going back to the place where you live?: Yes      Need for Family Participation in Patient Care: Yes (Comment) Care giver support system in place?: Yes (comment)   Criminal Activity/Legal Involvement Pertinent to Current Situation/Hospitalization: No - Comment as needed  Activities of Daily Living      Permission Sought/Granted                  Emotional Assessment Appearance:: Appears younger than  stated age Attitude/Demeanor/Rapport: Engaged Affect (typically observed): Pleasant Orientation: : Oriented to Self, Oriented to Place, Oriented to  Time, Oriented to Situation Alcohol / Substance Use: Not Applicable Psych Involvement: No (comment)  Admission diagnosis:  Left leg weakness [R29.898] Patient Active Problem List   Diagnosis Date Noted   Left leg weakness 06/27/2022   Metabolic acidosis 06/27/2022   Palpitations 05/08/2022   Seizures (HCC) 12/03/2020   Rectal bleeding 10/31/2020   AKI (acute kidney injury) (HCC) 10/31/2020   Cardiomyopathy (HCC) 09/23/2020   Atypical chest pain 09/23/2020   Precordial pain    CVA (cerebral vascular accident) (HCC) 09/02/2020   Altered mental status 07/23/2020   Seizure (HCC) 07/22/2020   Left-sided weakness 07/04/2020   Dysarthria 07/04/2020   Essential hypertension    Seizure disorder (HCC) 07/02/2017   Insulin-requiring or dependent type II diabetes mellitus (HCC) 07/02/2017   CKD (chronic kidney disease), stage III (HCC) 07/02/2017   Normocytic anemia 07/02/2017   Testicular/scrotal pain 11/09/2013   Microhematuria 11/09/2013   Condyloma acuminatum of scrotum 11/09/2013   PCP:  Loura Back, NP Pharmacy:   [facility] - [place], [place] - [address] [place] [address] Phone: [phone] Fax: [phone]  [facility] [phone] - [place], [place] - [address] AT [facility] [address] [place] [address] Phone: [phone] Fax: [phone]     Social Determinants of Health (SDOH) Interventions    Readmission Risk Interventions     No data to display

## 2022-06-28 NOTE — Progress Notes (Signed)
Neurology Progress Note  Subjective: -Reports no new complaints and that his left lower extremity is still not working and numb -Examined with OT at bedside  Exam: Vitals:   06/28/22 1000 06/28/22 1030  BP: 122/87 114/80  Pulse: 70 63  Resp:  (!) 3  Temp:    SpO2: 92% 100%   Gen: In bed, comfortable  Resp: non-labored breathing, no grossly audible wheezing Cardiac: Perfusing extremities well  Abd: soft, nt  Neuro: MS: Awake, alert, appropriately conversant, oriented to place, situation CN: EOMI, face symmetric Motor: When asked to swing his legs around to the side of the bed to sit, he does not move the left lower extremity until I lightly touch it.  With no assistance offered by myself (I simply push his foot towards his head), he is able to easily move his leg to the side of the bed.  He is able to stand and favors the right leg, bending the knee to keep his left foot off the floor.  He has astasia-abasia but is never in danger of falling DTR: Today his patellar reflexes are more symmetric  Pertinent Labs: CK, ESR, CRP, RPR, HIV all negative/within normal limits Lactate 2.9 from 2.2 previously Hemoglobin A1c 8.4%  MRI lumbar spine personally reviewed, agree with radiology: 1. Unchanged mild lower lumbar degenerative disc disease without spinal canal stenosis. 2. Unchanged mild right L4-5 neural foraminal stenosis.  Impression: Significant functional overlay remains.  There is possibility of some neuropathy contributing to his symptoms but labs are reassuring and I suspect he will make a good recovery. There is no further inpatient work-up that would be required.  Recommendations: -Outpatient EMG in 2 to 4 weeks may be helpful if his symptoms have not resolved, he follows with Dr. Judi Cong at [facility] and should contact her office for follow-up appointment -Appreciate PT/OT assistance -Neurology will be available on an as-needed basis, reach out if new  questions or concerns arise  Lesleigh Noe MD-PhD [facility] [phone]   Greater than 35 minutes were spent in care of this patient today, greater than 50% at bedside.

## 2022-06-28 NOTE — ED Notes (Signed)
Pt refused blood draw

## 2022-06-28 NOTE — ED Notes (Signed)
Pt eating lunch

## 2022-06-28 NOTE — Progress Notes (Signed)
PROGRESS NOTE    Nicholas [name]  OTL:[id number] DOB: [birth year] DOA: 06/27/2022 PCP: Loura Back, NP   Chief Complaint  Patient presents with   Aphasia    Brief Narrative:  HPI per Dr. Nunzio Cobbs is a 61 y.o. male with medical history significant of asthma, bipolar disorder, schizophrenia, chronic pain, seizures/pseudoseizures, conversion disorder, cervical myelopathy, GERD, hypertension, hyperlipidemia, hyperthyroidism, hemiplegic migraine, TIA, insulin-dependent type 2 diabetes, CKD stage IIIa, BPH presented to the ED with altered speech, right arm weakness, right face decreased sensation, left leg weakness and numbness.  Vital signs stable.  Labs significant for WBC 11.2, bicarb 17, anion gap 11, BUN 23, creatinine 1.9 (baseline 1.3), glucose 225.  Blood ethanol level undetectable.  Lactic acid 3.2.  UDS negative.  UA without signs of infection.  Brain MRI negative for stroke. Patient was given Tylenol, Benadryl, Compazine, 500 cc LR bolus, Keppra, 1 L normal saline bolus. Neurology will consult and recommended MRI of lumbar spine.   Patient states his symptoms started at 4 AM this morning.  He is endorsing left lower extremity weakness and numbness, reports pins and needle sensation at the bottom of his foot.  Denies saddle anesthesia or bowel/bladder dysfunction.  Endorsing chronic low back pain.  Endorsing right upper extremity weakness, numbness, and tingling since this morning.  Also endorsing right-sided facial numbness and right-sided headaches since this morning.  In addition, endorsing slurred speech and confusion.  States he checked his blood sugar at home and the meter read "high."  States his PCP gave him a steroid injection yesterday for headaches and advised him to double up on the dose of his nighttime insulin.    Assessment & Plan:   Principal Problem:   Left leg weakness Active Problems:   Seizure disorder (HCC)   CKD (chronic kidney disease), stage III  (HCC)   Essential hypertension   AKI (acute kidney injury) (HCC)   Metabolic acidosis  #1 left lower extremity weakness and numbness/right facial numbness/right-sided headache -Head CT negative for any acute abnormalities. -MRI brain negative for any acute abnormalities. -MRI L-spine with unchanged mild lower lumbar degenerative disc disease without spinal canal stenosis/unchanged mild right L2 to 4-5 neuroforaminal stenosis -Patient seen by neurology who was following and recommending PT/OT. -Neurology feels some significant functional overlay remains with the possibility of neuropathy contributing to his symptoms but labs remain reassuring and suspect patient will make a good recovery. -Neurology recommending PT/OT, outpatient EMG in 2 to 4 weeks if no resolution of symptoms with outpatient follow-up with Dr. Irma Newness at [facility].  2.  AKI on CKD stage IIIa -Likely secondary to prerenal azotemia. -Improved with hydration.  3.  Lactic acidosis -Likely secondary to dehydration. -No signs or symptoms of infection. -Improved.  4.  Metabolic acidosis -Likely secondary to AKI. -Improved with hydration.  5.  Borderline leukocytosis  -Improving. -Repeat labs in the AM.  6.  Asthma -Stable.  7.  Bipolar disorder/schizophrenia -Continue home regimen.  8.  Hypertension -Blood pressure borderline and as such we will hold verapamil today.  9.  Hyperlipidemia -Statin.  10.  Poorly controlled type 2 diabetes mellitus, insulin-dependent, POA -Hemoglobin A1c 8.4 (06/27/2022) -CBG 172 this morning. -Continue Semglee 25 units, SSI.  #11 chronic neuropathic pain -Continue gabapentin. -Resume home dose pain regimen.    DVT prophylaxis: Lovenox Code Status: Full Family Communication: Updated patient.  No family at bedside. Disposition: Likely home with home health pending PT evaluation  Status is: Observation The  patient remains OBS appropriate and will d/c before  2 midnights.   Consultants:  Neurology: Dr. Iver Nestle 06/27/2022  Procedures:  CT head 06/27/2022 MRI brain 06/27/2022 MRI L-spine 06/27/2022   Antimicrobials:  None   Subjective: Sitting up in gurney.  Still with complaints of decreased sensation to the right face and right upper extremity.  Still with complaints of left lower extremity weakness.  States he is hungry hoping he can get a diet.  States was just seen by the neurologist who was recommending patient be seen by PT.  Objective: Vitals:   06/28/22 0815 06/28/22 0930 06/28/22 1000 06/28/22 1030  BP:  111/72 122/87 114/80  Pulse:  (!) 57 70 63  Resp: 12   (!) 3  Temp:      TempSrc:      SpO2:  95% 92% 100%  Weight:      Height:       No intake or output data in the 24 hours ending 06/28/22 1137 Filed Weights   06/27/22 0847  Weight: 89.4 kg    Examination:  General exam: NAD. Respiratory system: Clear to auscultation. Respiratory effort normal. Cardiovascular system: S1 & S2 heard, RRR. No JVD, murmurs, rubs, gallops or clicks. No pedal edema. Gastrointestinal system: Abdomen is nondistended, soft and nontender. No organomegaly or masses felt. Normal bowel sounds heard. Central nervous system: Decreased sensation right face right upper extremity.  Alert and oriented.  Left lower extremity weakness.  Extremities: Symmetric 5 x 5 power. Skin: No rashes, lesions or ulcers Psychiatry: Judgement and insight appear normal. Mood & affect appropriate.     Data Reviewed: I have personally reviewed following labs and imaging studies  CBC: Recent Labs  Lab 06/27/22 0828 06/27/22 0836  WBC 11.2*  --   NEUTROABS 9.7*  --   HGB 13.4 14.6  HCT 41.5 43.0  MCV 85.6  --   PLT 218  --     Basic Metabolic Panel: Recent Labs  Lab 06/27/22 0828 06/27/22 0836 06/27/22 2317  NA 136 140 141  K 4.8 4.7 3.8  CL 108 108 113*  CO2 17*  --  21*  GLUCOSE 225* 219* 131*  BUN 23* 25* 20  CREATININE 1.90* 1.80* 1.41*   CALCIUM 9.6  --  9.3    GFR: Estimated Creatinine Clearance: 59.4 mL/min (A) (by C-G formula based on SCr of 1.41 mg/dL (H)).  Liver Function Tests: Recent Labs  Lab 06/27/22 0828  AST 28  ALT 33  ALKPHOS 67  BILITOT 0.3  PROT 7.0  ALBUMIN 4.2    CBG: Recent Labs  Lab 06/27/22 0827 06/28/22 0731 06/28/22 1107  GLUCAP 218* 172* 116*     Recent Results (from the past 240 hour(s))  Resp Panel by RT-PCR (Flu A&B, Covid) Anterior Nasal Swab     Status: None   Collection Time: 06/27/22  8:28 AM   Specimen: Anterior Nasal Swab  Result Value Ref Range Status   SARS Coronavirus 2 by RT PCR NEGATIVE NEGATIVE Final    Comment: (NOTE) SARS-CoV-2 target nucleic acids are NOT DETECTED.  The SARS-CoV-2 RNA is generally detectable in upper respiratory specimens during the acute phase of infection. The lowest concentration of SARS-CoV-2 viral copies this assay can detect is 138 copies/mL. A negative result does not preclude SARS-Cov-2 infection and should not be used as the sole basis for treatment or other patient management decisions. A negative result may occur with  improper specimen collection/handling, submission of specimen other than nasopharyngeal  swab, presence of viral mutation(s) within the areas targeted by this assay, and inadequate number of viral copies(<138 copies/mL). A negative result must be combined with clinical observations, patient history, and epidemiological information. The expected result is Negative.  Fact Sheet for Patients:  BloggerCourse.com  Fact Sheet for Healthcare Providers:  SeriousBroker.it  This test is no t yet approved or cleared by the [place] FDA and  has been authorized for detection and/or diagnosis of SARS-CoV-2 by FDA under an Emergency Use Authorization (EUA). This EUA will remain  in effect (meaning this test can be used) for the duration of the COVID-19 declaration  under Section 564(b)(1) of the Act, 21 U.S.C.section 360bbb-3(b)(1), unless the authorization is terminated  or revoked sooner.       Influenza A by PCR NEGATIVE NEGATIVE Final   Influenza B by PCR NEGATIVE NEGATIVE Final    Comment: (NOTE) The Xpert Xpress SARS-CoV-2/FLU/RSV plus assay is intended as an aid in the diagnosis of influenza from Nasopharyngeal swab specimens and should not be used as a sole basis for treatment. Nasal washings and aspirates are unacceptable for Xpert Xpress SARS-CoV-2/FLU/RSV testing.  Fact Sheet for Patients: BloggerCourse.com  Fact Sheet for Healthcare Providers: SeriousBroker.it  This test is not yet approved or cleared by the [place] FDA and has been authorized for detection and/or diagnosis of SARS-CoV-2 by FDA under an Emergency Use Authorization (EUA). This EUA will remain in effect (meaning this test can be used) for the duration of the COVID-19 declaration under Section 564(b)(1) of the Act, 21 U.S.C. section 360bbb-3(b)(1), unless the authorization is terminated or revoked.  Performed at [facility], [address] [place], [place] [address]          Radiology Studies: MR LUMBAR SPINE WO CONTRAST  Result Date: 06/27/2022 CLINICAL DATA:  Low back pain EXAM: MRI LUMBAR SPINE WITHOUT CONTRAST TECHNIQUE: Multiplanar, multisequence MR imaging of the lumbar spine was performed. No intravenous contrast was administered. COMPARISON:  None Available. FINDINGS: Segmentation:  Standard. Alignment:  Physiologic. Vertebrae:  No fracture, evidence of discitis, or bone lesion. Conus medullaris and cauda equina: Conus extends to the L2 level. Conus and cauda equina appear normal. Paraspinal and other soft tissues: Negative Disc levels: L1-L2: Normal disc space and facet joints. No spinal canal stenosis. No neural foraminal stenosis. L2-L3: Normal disc space and facet joints. No spinal canal  stenosis. No neural foraminal stenosis. L3-L4: Normal disc space and facet joints. No spinal canal stenosis. No neural foraminal stenosis. L4-L5: Unchanged small disc bulge. No spinal canal stenosis. Mild right neural foraminal stenosis. L5-S1: Small disc bulge. No spinal canal stenosis. No neural foraminal stenosis. Visualized sacrum: Normal. IMPRESSION: 1. Unchanged mild lower lumbar degenerative disc disease without spinal canal stenosis. 2. Unchanged mild right L4-5 neural foraminal stenosis. Electronically Signed   By: Deatra Robinson M.D.   On: 06/27/2022 22:35   MR BRAIN WO CONTRAST  Result Date: 06/27/2022 CLINICAL DATA:  Aphasia EXAM: MRI HEAD WITHOUT CONTRAST TECHNIQUE: Multiplanar, multiecho pulse sequences of the brain and surrounding structures were obtained without intravenous contrast. COMPARISON:  Head CT from earlier the same day and brain MRI 03/13/2021 FINDINGS: Brain: No acute infarction, hemorrhage, hydrocephalus, extra-axial collection or mass lesion. No white matter disease or atrophy Vascular: Major flow voids are preserved Skull and upper cervical spine: Normal marrow signal Sinuses/Orbits: Negative. Other: Motion artifact, reportedly due to uncontrolled snoring. IMPRESSION: Negative motion degraded brain MRI. Electronically Signed   By: Audry Riles.D.  On: 06/27/2022 12:29   CT HEAD WO CONTRAST  Result Date: 06/27/2022 CLINICAL DATA:  Weakness.  Possible stroke. EXAM: CT HEAD WITHOUT CONTRAST TECHNIQUE: Contiguous axial images were obtained from the base of the skull through the vertex without intravenous contrast. RADIATION DOSE REDUCTION: This exam was performed according to the departmental dose-optimization program which includes automated exposure control, adjustment of the mA and/or kV according to patient size and/or use of iterative reconstruction technique. COMPARISON:  06/25/2021 FINDINGS: Brain: No evidence of acute infarction, hemorrhage, hydrocephalus, extra-axial  collection or mass lesion/mass effect. Vascular: No hyperdense vessel or unexpected calcification. Skull: Normal. Negative for fracture or focal lesion. Sinuses/Orbits: No acute finding. Other: None. IMPRESSION: No acute findings. Electronically Signed   By: Elberta Fortis M.D.   On: 06/27/2022 09:08        Scheduled Meds:  atorvastatin  80 mg Oral QHS   enoxaparin (LOVENOX) injection  40 mg Subcutaneous Q24H   gabapentin  600 mg Oral TID   insulin aspart  0-15 Units Subcutaneous TID WC   insulin aspart  0-5 Units Subcutaneous QHS   insulin glargine-yfgn  25 Units Subcutaneous QHS   latanoprost  1 drop Both Eyes QHS   lurasidone  20 mg Oral QHS   mometasone-formoterol  2 puff Inhalation BID   nortriptyline  100 mg Oral QHS   timolol  1 drop Both Eyes Daily   traZODone  100 mg Oral QHS   [START ON 06/29/2022] verapamil  120 mg Oral Daily   zonisamide  200 mg Oral BID   Continuous Infusions:  sodium chloride 100 mL/hr at 06/28/22 1117     LOS: 0 days    Time spent: 35 minutes    Ramiro Harvest, MD [facility]   To contact the attending provider between 7A-7P or the covering provider during after hours 7P-7A, please log into the web site [email or web address] and access using universal [place] password for that web site. If you do not have the password, please call the hospital operator.  06/28/2022, 11:37 AM

## 2022-06-28 NOTE — ED Notes (Signed)
ED TO INPATIENT HANDOFF REPORT   S Name/Age/Gender Nicholas [name] 61 y.o. male Room/Bed: 009C/009C  Code Status   Code Status: Full Code  Home/SNF/Other Home Patient oriented to: self, place, time, and situation Is this baseline? Yes   Triage Complete: Triage complete  Chief Complaint Left leg weakness [R29.898]  Triage Note Pt BIBA from home for reports of diabetic problems with high glucose. Pt started on steroid yesterday and was told that sugar will read high and to continue Metformin. Pt told EMS that his speech is off. Reports that he can't control his tongue. Pt attempted to have seizure with EMS but was immediately responsive.    Allergies Allergies  Allergen Reactions   Finasteride Other (See Comments)    Break out   Levothyroxine Anaphylaxis and Cough    Chronic cough   Nsaids Other (See Comments)    D/t gastric ulcer   Amoxicillin Itching and Other (See Comments)    THRUSH Causes sores in mouth   Ampicillin Other (See Comments)    THRUSH   Penicillins Itching and Other (See Comments)    THRUSH- Causes sores in mouth   Strawberry Extract Swelling    LIPS SWELL   Asa [Aspirin] Other (See Comments)    Bleeding    Bactrim [Sulfamethoxazole-Trimethoprim] Hives, Itching and Other (See Comments)    GI upset   Dapagliflozin     Other reaction(s): Other (See Comments) Allergic to steroids and had mouth broke out.    Depakote [Divalproex Sodium]     Causes double vision and speech problems    Dilantin [Phenytoin] Other (See Comments)    Severe skin peeling   Methocarbamol Other (See Comments)    dizziness   Other     Med for prostate that startes with a M- Broke out the insid eof mouth, split the tongue, and caused throudh   Risperidone And Related Other (See Comments)    Hallucinations    Sitagliptin Other (See Comments)    pancreatitis   Strawberry (Diagnostic) Itching and Swelling   Sulfa Antibiotics Other (See Comments)    Affects thyroid     Tolmetin Other (See Comments)    D/t gastric ulcer   Ultram [Tramadol Hcl] Other (See Comments)    D/t gastric ulcer   Buprenorphine Itching, Rash and Other (See Comments)    "Patches broke me out in red patches-  caused a fever, also"   Oatmeal Hives and Other (See Comments)    White spots/sores in mouth    Level of Care/Admitting Diagnosis ED Disposition     ED Disposition  Admit   Condition  --   Comment  Hospital Area: [name] [[id number]]  Level of Care: Med-Surg [16]  May place patient in observation at [facility] or [facility] if equivalent level of care is available:: Yes  Covid Evaluation: Asymptomatic - no recent exposure (last 10 days) testing not required  Diagnosis: Left leg weakness [[id number]]  Admitting Physician: [name] Giovanni [[id number]]  Attending Physician: [name] Giovanni [[id number]]          B Medical/Surgery History Past Medical History:  Diagnosis Date   Asthma    Bipolar 1 disorder (HCC)    Borderline glaucoma    Chronic pain    Epididymal pain    LEFT   Epilepsy, 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67---  LAST SEIZURE 1 WK AGO (APPROX ,  10-31-2013)   NO NEUROLOGIST---  PT SEES PCP  DR Lindajo Royal   Feeling  of incomplete bladder emptying    Frequency of urination    Gastric ulcer    GERD (gastroesophageal reflux disease)    Hypertension    Hyperthyroidism    NO MEDS    Migraine    Seizures (HCC)    TIA (transient ischemic attack)    Type 2 diabetes mellitus ([place])    Past Surgical History:  Procedure Laterality Date   ABDOMINAL SURGERY     ANTERIOR CERVICAL DECOMP/DISCECTOMY FUSION  2007   C4  --  C6   CERVICAL FUSION     CHOLECYSTECTOMY     COLONOSCOPY WITH PROPOFOL Left 11/02/2020   Procedure: COLONOSCOPY WITH PROPOFOL;  Surgeon: Arta Silence, MD;  Location: [facility];  Service: Endoscopy;  Laterality: Left;   CYSTOSCOPY N/A 11/09/2013   Procedure: CYSTOSCOPY FLEXIBLE;  Surgeon: Irine Seal, MD;  Location: [facility];  Service: Urology;  Laterality: N/A;   EPIDIDYMECTOMY Left 11/09/2013   Procedure:  LEFT EPIDIDYMECTOMY;  Surgeon: Irine Seal, MD;  Location: [facility];  Service: Urology;  Laterality: Left;  POSSIBLE OUTPATIENT WITH OBSERVATION   EXCISION LIPOMA LEFT SHOULDER  2004 (APPROX)   MULTIPLE CYST REMOVED FROM CHEST  AGE 64   OTHER SURGICAL HISTORY     hemorroid surgery    TESTICLE REMOVAL Left    TONSILLECTOMY       A IV Location/Drains/Wounds Patient Lines/Drains/Airways Status     Active Line/Drains/Airways     Name Placement date Placement time Site Days   Peripheral IV 06/27/22 20 G Right Antecubital 06/27/22  0829  Antecubital  1            Intake/Output Last 24 hours No intake or output data in the 24 hours ending 06/28/22 1422  Labs/Imaging Results for orders placed or performed during the hospital encounter of 06/27/22 (from the past 48 hour(s))  POC CBG, ED     Status: Abnormal   Collection Time: 06/27/22  8:27 AM  Result Value Ref Range   Glucose-Capillary 218 (H) 70 - 99 mg/dL    Comment: Glucose reference range applies only to samples taken after fasting for at least 8 hours.  Resp Panel by RT-PCR (Flu A&B, Covid) Anterior Nasal Swab     Status: None   Collection Time: 06/27/22  8:28 AM   Specimen: Anterior Nasal Swab  Result Value Ref Range   SARS Coronavirus 2 by RT PCR NEGATIVE NEGATIVE    Comment: (NOTE) SARS-CoV-2 target nucleic acids are NOT DETECTED.  The SARS-CoV-2 RNA is generally detectable in upper respiratory specimens during the acute phase of infection. The lowest concentration of SARS-CoV-2 viral copies this assay can detect is 138 copies/mL. A negative result does not preclude SARS-Cov-2 infection and should not be used as the sole basis for treatment or other patient management decisions. A negative result may occur with  improper specimen collection/handling, submission of specimen other than nasopharyngeal  swab, presence of viral mutation(s) within the areas targeted by this assay, and inadequate number of viral copies(<138 copies/mL). A negative result must be combined with clinical observations, patient history, and epidemiological information. The expected result is Negative.  Fact Sheet for Patients:  EntrepreneurPulse.com.au  Fact Sheet for Healthcare Providers:  IncredibleEmployment.be  This test is no t yet approved or cleared by the [place] FDA and  has been authorized for detection and/or diagnosis of SARS-CoV-2 by FDA under an Emergency Use Authorization (EUA). This EUA will remain  in effect (meaning this test can  be used) for the duration of the COVID-19 declaration under Section 564(b)(1) of the Act, 21 U.S.C.section 360bbb-3(b)(1), unless the authorization is terminated  or revoked sooner.       Influenza A by PCR NEGATIVE NEGATIVE   Influenza B by PCR NEGATIVE NEGATIVE    Comment: (NOTE) The Xpert Xpress SARS-CoV-2/FLU/RSV plus assay is intended as an aid in the diagnosis of influenza from Nasopharyngeal swab specimens and should not be used as a sole basis for treatment. Nasal washings and aspirates are unacceptable for Xpert Xpress SARS-CoV-2/FLU/RSV testing.  Fact Sheet for Patients: EntrepreneurPulse.com.au  Fact Sheet for Healthcare Providers: IncredibleEmployment.be  This test is not yet approved or cleared by the [place] FDA and has been authorized for detection and/or diagnosis of SARS-CoV-2 by FDA under an Emergency Use Authorization (EUA). This EUA will remain in effect (meaning this test can be used) for the duration of the COVID-19 declaration under Section 564(b)(1) of the Act, 21 U.S.C. section 360bbb-3(b)(1), unless the authorization is terminated or revoked.  Performed at [facility], [place] [address] [place], [place] [address]   Protime-INR     Status:  None   Collection Time: 06/27/22  8:28 AM  Result Value Ref Range   Prothrombin Time 15.2 11.4 - 15.2 seconds   INR 1.2 0.8 - 1.2    Comment: (NOTE) INR goal varies based on device and disease states. Performed at [facility], [place] [address] [place], [place] [address]   APTT     Status: None   Collection Time: 06/27/22  8:28 AM  Result Value Ref Range   aPTT 26 24 - 36 seconds    Comment: Performed at [facility] [address], [place], [place] [address]  CBC     Status: Abnormal   Collection Time: 06/27/22  8:28 AM  Result Value Ref Range   WBC 11.2 (H) 4.0 - 10.5 K/uL   RBC 4.85 4.22 - 5.81 MIL/uL   Hemoglobin 13.4 13.0 - 17.0 g/dL   HCT 41.5 39.0 - 52.0 %   MCV 85.6 80.0 - 100.0 fL   MCH 27.6 26.0 - 34.0 pg   MCHC 32.3 30.0 - 36.0 g/dL   RDW 15.1 11.5 - 15.5 %   Platelets 218 150 - 400 K/uL   nRBC 0.0 0.0 - 0.2 %    Comment: Performed at [facility], [place] [address], [place], [place] [address]  Differential     Status: Abnormal   Collection Time: 06/27/22  8:28 AM  Result Value Ref Range   Neutrophils Relative % 87 %   Neutro Abs 9.7 (H) 1.7 - 7.7 K/uL   Lymphocytes Relative 10 %   Lymphs Abs 1.1 0.7 - 4.0 K/uL   Monocytes Relative 3 %   Monocytes Absolute 0.3 0.1 - 1.0 K/uL   Eosinophils Relative 0 %   Eosinophils Absolute 0.0 0.0 - 0.5 K/uL   Basophils Relative 0 %   Basophils Absolute 0.0 0.0 - 0.1 K/uL   Immature Granulocytes 0 %   Abs Immature Granulocytes 0.05 0.00 - 0.07 K/uL    Comment: Performed at [facility] [address], [place], [place] [address]  Comprehensive metabolic panel     Status: Abnormal   Collection Time: 06/27/22  8:28 AM  Result Value Ref Range   Sodium 136 135 - 145 mmol/L   Potassium 4.8 3.5 - 5.1 mmol/L   Chloride 108 98 - 111 mmol/L  CO2 17 (L) 22 - 32 mmol/L   Glucose, Bld 225 (H) 70 - 99 mg/dL    Comment: Glucose reference range applies only to samples taken after fasting for at least 8 hours.    BUN 23 (H) 6 - 20 mg/dL   Creatinine, Ser 1.90 (H) 0.61 - 1.24 mg/dL   Calcium 9.6 8.9 - 10.3 mg/dL   Total Protein 7.0 6.5 - 8.1 g/dL   Albumin 4.2 3.5 - 5.0 g/dL   AST 28 15 - 41 U/L   ALT 33 0 - 44 U/L   Alkaline Phosphatase 67 38 - 126 U/L   Total Bilirubin 0.3 0.3 - 1.2 mg/dL   GFR, Estimated 40 (L) >60 mL/min    Comment: (NOTE) Calculated using the CKD-EPI Creatinine Equation (2021)    Anion gap 11 5 - 15    Comment: Performed at [facility] [address], [place], [place] [address]  I-stat chem 8, ED     Status: Abnormal   Collection Time: 06/27/22  8:36 AM  Result Value Ref Range   Sodium 140 135 - 145 mmol/L   Potassium 4.7 3.5 - 5.1 mmol/L   Chloride 108 98 - 111 mmol/L   BUN 25 (H) 6 - 20 mg/dL   Creatinine, Ser 1.80 (H) 0.61 - 1.24 mg/dL   Glucose, Bld 219 (H) 70 - 99 mg/dL    Comment: Glucose reference range applies only to samples taken after fasting for at least 8 hours.   Calcium, Ion 1.35 1.15 - 1.40 mmol/L   TCO2 18 (L) 22 - 32 mmol/L   Hemoglobin 14.6 13.0 - 17.0 g/dL   HCT 43.0 39.0 - 52.0 %  Ethanol     Status: None   Collection Time: 06/27/22 10:09 AM  Result Value Ref Range   Alcohol, Ethyl (B) <10 <10 mg/dL    Comment: (NOTE) Lowest detectable limit for serum alcohol is 10 mg/dL.  For medical purposes only. Performed at [facility], [place] [address], [place], [place] [address]   Lactic acid, plasma     Status: Abnormal   Collection Time: 06/27/22 10:09 AM  Result Value Ref Range   Lactic Acid, Venous 3.2 (HH) 0.5 - 1.9 mmol/L    Comment: CRITICAL RESULT CALLED TO, READ BACK BY AND VERIFIED WITH L.BALDWIN,RN 06/27/2022 AT 1104 AHUGHES Performed at [facility], [place] [address] [place], [place] [address]   Urine rapid drug screen (hosp performed)     Status: None   Collection Time: 06/27/22  3:44 PM  Result Value Ref Range   Opiates NONE DETECTED NONE DETECTED   Cocaine NONE DETECTED NONE DETECTED   Benzodiazepines NONE  DETECTED NONE DETECTED   Amphetamines NONE DETECTED NONE DETECTED   Tetrahydrocannabinol NONE DETECTED NONE DETECTED   Barbiturates NONE DETECTED NONE DETECTED    Comment: (NOTE) DRUG SCREEN FOR MEDICAL PURPOSES ONLY.  IF CONFIRMATION IS NEEDED FOR ANY PURPOSE, NOTIFY LAB WITHIN 5 DAYS.  LOWEST DETECTABLE LIMITS FOR URINE DRUG SCREEN Drug Class                     Cutoff (ng/mL) Amphetamine and metabolites    1000 Barbiturate and metabolites    200 Benzodiazepine                 [id number] Tricyclics and metabolites     300 Opiates and metabolites        300 Cocaine and metabolites  300 THC                            50 Performed at [facility], [place] [address], [place], [place] [address]   Urinalysis, Routine w reflex microscopic     Status: Abnormal   Collection Time: 06/27/22  3:44 PM  Result Value Ref Range   Color, Urine YELLOW YELLOW   APPearance CLEAR CLEAR   Specific Gravity, Urine 1.014 1.005 - 1.030   pH 5.0 5.0 - 8.0   Glucose, UA >=500 (A) NEGATIVE mg/dL   Hgb urine dipstick NEGATIVE NEGATIVE   Bilirubin Urine NEGATIVE NEGATIVE   Ketones, ur NEGATIVE NEGATIVE mg/dL   Protein, ur NEGATIVE NEGATIVE mg/dL   Nitrite NEGATIVE NEGATIVE   Leukocytes,Ua NEGATIVE NEGATIVE   WBC, UA 0-5 0 - 5 WBC/hpf   Bacteria, UA NONE SEEN NONE SEEN   Mucus PRESENT     Comment: Performed at [facility] [address] [place], [place] [address]  Lactic acid, plasma     Status: Abnormal   Collection Time: 06/27/22  8:16 PM  Result Value Ref Range   Lactic Acid, Venous 2.9 (HH) 0.5 - 1.9 mmol/L    Comment: CRITICAL VALUE NOTED. VALUE IS CONSISTENT WITH PREVIOUSLY REPORTED/CALLED VALUE Performed at [facility], [place] [address], [place], [place] [address]   HIV Antibody (routine testing w rflx)     Status: None   Collection Time: 06/27/22 11:17 PM  Result Value Ref Range   HIV Screen 4th Generation wRfx Non Reactive Non Reactive    Comment: Performed at [facility], [place] [address] [place], [place] [id number]  Basic metabolic panel     Status: Abnormal   Collection Time: 06/27/22 11:17 PM  Result Value Ref Range   Sodium 141 135 - 145 mmol/L   Potassium 3.8 3.5 - 5.1 mmol/L    Comment: DELTA CHECK NOTED   Chloride 113 (H) 98 - 111 mmol/L   CO2 21 (L) 22 - 32 mmol/L   Glucose, Bld 131 (H) 70 - 99 mg/dL    Comment: Glucose reference range applies only to samples taken after fasting for at least 8 hours.   BUN 20 6 - 20 mg/dL   Creatinine, Ser 1.41 (H) 0.61 - 1.24 mg/dL   Calcium 9.3 8.9 - 10.3 mg/dL   GFR, Estimated 57 (L) >60 mL/min    Comment: (NOTE) Calculated using the CKD-EPI Creatinine Equation (2021)    Anion gap 7 5 - 15    Comment: Performed at [facility] [address] [place], [place] [address]  Hemoglobin A1c     Status: Abnormal   Collection Time: 06/27/22 11:17 PM  Result Value Ref Range   Hgb A1c MFr Bld 8.4 (H) 4.8 - 5.6 %    Comment: (NOTE) Pre diabetes:          5.7%-6.4%  Diabetes:              >6.4%  Glycemic control for   <7.0% adults with diabetes    Mean Plasma Glucose 194.38 mg/dL    Comment: Performed at [facility] [address] [place], [place] [address]  Sedimentation rate     Status: None   Collection Time: 06/27/22 11:17 PM  Result Value Ref Range   Sed Rate 3 0 - 16 mm/hr    Comment: Performed at [facility], [address], [place], [place] [address]  C-reactive protein     Status: None   Collection Time: 06/27/22 11:17 PM  Result Value Ref Range   CRP 0.6 <1.0 mg/dL    Comment: Performed at [facility], [place] [address] [place], [place] [address]  CK     Status: None   Collection Time: 06/27/22 11:17 PM  Result Value Ref Range   Total CK 168 49 - 397 U/L    Comment: Performed at [facility], [place] [address], [place], [place] [address]  RPR     Status: None   Collection Time: 06/27/22 11:17 PM  Result Value Ref Range   RPR Ser Ql NON REACTIVE NON  REACTIVE    Comment: Performed at [facility], [place] [address] [place], [place] [address]  CBG monitoring, ED     Status: Abnormal   Collection Time: 06/28/22  7:31 AM  Result Value Ref Range   Glucose-Capillary 172 (H) 70 - 99 mg/dL    Comment: Glucose reference range applies only to samples taken after fasting for at least 8 hours.   Comment 1 Notify RN    Comment 2 Document in Chart   CBG monitoring, ED     Status: Abnormal   Collection Time: 06/28/22 11:07 AM  Result Value Ref Range   Glucose-Capillary 116 (H) 70 - 99 mg/dL    Comment: Glucose reference range applies only to samples taken after fasting for at least 8 hours.   Comment 1 Notify RN    Comment 2 Document in Chart   CBG monitoring, ED     Status: Abnormal   Collection Time: 06/28/22 12:33 PM  Result Value Ref Range   Glucose-Capillary 112 (H) 70 - 99 mg/dL    Comment: Glucose reference range applies only to samples taken after fasting for at least 8 hours.   MR LUMBAR SPINE WO CONTRAST  Result Date: 06/27/2022 CLINICAL DATA:  Low back pain EXAM: MRI LUMBAR SPINE WITHOUT CONTRAST TECHNIQUE: Multiplanar, multisequence MR imaging of the lumbar spine was performed. No intravenous contrast was administered. COMPARISON:  None Available. FINDINGS: Segmentation:  Standard. Alignment:  Physiologic. Vertebrae:  No fracture, evidence of discitis, or bone lesion. Conus medullaris and cauda equina: Conus extends to the L2 level. Conus and cauda equina appear normal. Paraspinal and other soft tissues: Negative Disc levels: L1-L2: Normal disc space and facet joints. No spinal canal stenosis. No neural foraminal stenosis. L2-L3: Normal disc space and facet joints. No spinal canal stenosis. No neural foraminal stenosis. L3-L4: Normal disc space and facet joints. No spinal canal stenosis. No neural foraminal stenosis. L4-L5: Unchanged small disc bulge. No spinal canal stenosis. Mild right neural foraminal stenosis. L5-S1: Small disc  bulge. No spinal canal stenosis. No neural foraminal stenosis. Visualized sacrum: Normal. IMPRESSION: 1. Unchanged mild lower lumbar degenerative disc disease without spinal canal stenosis. 2. Unchanged mild right L4-5 neural foraminal stenosis. Electronically Signed   By: Ulyses Jarred M.D.   On: 06/27/2022 22:35   MR BRAIN WO CONTRAST  Result Date: 06/27/2022 CLINICAL DATA:  Aphasia EXAM: MRI HEAD WITHOUT CONTRAST TECHNIQUE: Multiplanar, multiecho pulse sequences of the brain and surrounding structures were obtained without intravenous contrast. COMPARISON:  Head CT from earlier the same day and brain MRI 03/13/2021 FINDINGS: Brain: No acute infarction, hemorrhage, hydrocephalus, extra-axial collection or mass lesion. No white matter disease or atrophy Vascular: Major flow voids are preserved Skull and upper cervical spine: Normal marrow  signal Sinuses/Orbits: Negative. Other: Motion artifact, reportedly due to uncontrolled snoring. IMPRESSION: Negative motion degraded brain MRI. Electronically Signed   By: Tiburcio Pea M.D.   On: 06/27/2022 12:29   CT HEAD WO CONTRAST  Result Date: 06/27/2022 CLINICAL DATA:  Weakness.  Possible stroke. EXAM: CT HEAD WITHOUT CONTRAST TECHNIQUE: Contiguous axial images were obtained from the base of the skull through the vertex without intravenous contrast. RADIATION DOSE REDUCTION: This exam was performed according to the departmental dose-optimization program which includes automated exposure control, adjustment of the mA and/or kV according to patient size and/or use of iterative reconstruction technique. COMPARISON:  06/25/2021 FINDINGS: Brain: No evidence of acute infarction, hemorrhage, hydrocephalus, extra-axial collection or mass lesion/mass effect. Vascular: No hyperdense vessel or unexpected calcification. Skull: Normal. Negative for fracture or focal lesion. Sinuses/Orbits: No acute finding. Other: None. IMPRESSION: No acute findings. Electronically Signed    By: Elberta Fortis M.D.   On: 06/27/2022 09:08    Pending Labs Unresulted Labs (From admission, onward)     Start     Ordered   06/28/22 0859  Lactic acid, plasma  Once,   R        06/28/22 0858   06/28/22 0848  Magnesium  Once,   R        06/28/22 0847   06/28/22 0848  Phosphorus  Once,   R        06/28/22 0847   06/28/22 0500  Basic metabolic panel  Tomorrow morning,   R        06/27/22 2035   06/28/22 0500  CBC  Tomorrow morning,   R        06/27/22 2035   06/27/22 1348  Miscellaneous [facility] test (send-out)  Once,   URGENT       Question:  Test name / description:  Answer:  zonisamide level   06/27/22 1347            Vitals/Pain Today's Vitals   06/28/22 1315 06/28/22 1330 06/28/22 1345 06/28/22 1400  BP: 102/67 102/64 98/71 93/62   Pulse: 78 73 78 74  Resp: 20 20 17 20   Temp:      TempSrc:      SpO2: 92% 97% 94% 98%  Weight:      Height:      PainSc:        Isolation Precautions No active isolations  Medications Medications  albuterol (PROVENTIL) (2.5 MG/3ML) 0.083% nebulizer solution 2.5 mg (has no administration in time range)  atorvastatin (LIPITOR) tablet 80 mg (80 mg Oral Given 06/27/22 2304)  mometasone-formoterol (DULERA) 100-5 MCG/ACT inhaler 2 puff (2 puffs Inhalation Given 06/28/22 1100)  gabapentin (NEURONTIN) capsule 600 mg (600 mg Oral Given 06/28/22 1057)  latanoprost (XALATAN) 0.005 % ophthalmic solution 1 drop (1 drop Both Eyes Not Given 06/27/22 2355)  lurasidone (LATUDA) tablet 20 mg (20 mg Oral Given 06/27/22 2302)  nortriptyline (PAMELOR) capsule 100 mg (100 mg Oral Given 06/27/22 2301)  timolol (TIMOPTIC) 0.5 % ophthalmic solution 1 drop (1 drop Both Eyes Given 06/28/22 1059)  insulin glargine-yfgn (SEMGLEE) injection 25 Units (25 Units Subcutaneous Given 06/28/22 0033)  traZODone (DESYREL) tablet 100 mg (100 mg Oral Given 06/27/22 2259)  zonisamide (ZONEGRAN) capsule 200 mg (200 mg Oral Given 06/28/22 1058)  enoxaparin (LOVENOX) injection 40 mg  (40 mg Subcutaneous Given 06/27/22 2304)  acetaminophen (TYLENOL) tablet 650 mg (650 mg Oral Given 06/28/22 1238)    Or  acetaminophen (TYLENOL) suppository 650 mg ( Rectal See Alternative  06/28/22 1238)  insulin aspart (novoLOG) injection 0-15 Units ( Subcutaneous Not Given 06/28/22 1108)  insulin aspart (novoLOG) injection 0-5 Units ( Subcutaneous Not Given 06/28/22 0021)  0.9 %  sodium chloride infusion (has no administration in time range)  verapamil (CALAN-SR) CR tablet 120 mg (has no administration in time range)  0.9 %  sodium chloride infusion ( Intravenous New Bag/Given 06/28/22 1117)  acetaminophen (TYLENOL) tablet 650 mg (650 mg Oral Given 06/27/22 0839)  prochlorperazine (COMPAZINE) injection 10 mg (10 mg Intravenous Given 06/27/22 0843)  sodium chloride 0.9 % bolus 1,000 mL (0 mLs Intravenous Stopped 06/27/22 1001)  diphenhydrAMINE (BENADRYL) injection 25 mg (25 mg Intravenous Given 06/27/22 0843)  lactated ringers bolus 500 mL (0 mLs Intravenous Stopped 06/27/22 1542)  levETIRAcetam (KEPPRA) IVPB 500 mg/100 mL premix (0 mg Intravenous Stopped 06/27/22 1349)  oxyCODONE-acetaminophen (PERCOCET/ROXICET) 5-325 MG per tablet 1 tablet (1 tablet Oral Given 06/27/22 2305)    Mobility walks with device Low fall risk   Focused Assessments Neuro Assessment Handoff:  Swallow screen pass? Yes    NIH Stroke Scale ( + Modified Stroke Scale Criteria)  LOC Questions (1b. )   +: Answers both questions correctly LOC Commands (1c. )   + : Performs both tasks correctly Best Gaze (2. )  +: Normal Visual (3. )  +: No visual loss Motor Arm, Left (5a. )   +: No drift Motor Arm, Right (5b. )   +: No drift Motor Leg, Left (6a. )   +: No effort against gravity Motor Leg, Right (6b. )   +: No drift Sensory (8. )   +: Mild-to-moderate sensory loss, patient feels pinprick is less sharp or is dull on the affected side, or there is a loss of superficial pain with pinprick, but patient is aware of being  touched Best Language (9. )   +: Mild-to-moderate aphasia Extinction/Inattention (11.)   +: No Abnormality Modified SS Total  +: 5     Neuro Assessment: Exceptions to WDL Neuro Checks:      Last Documented NIHSS Modified Score: 5 (06/27/22 1048) Has TPA been given? No If patient is a Neuro Trauma and patient is going to OR before floor call report to [place] nurse: [phone] or [phone]   R Recommendations: See Admitting Provider Note

## 2022-06-28 NOTE — ED Notes (Signed)
Patient transferred into a hospital bed. Comforted. No concerns/needs expressed.

## 2022-06-28 NOTE — Evaluation (Signed)
Occupational Therapy Evaluation Patient Details Name: Nicholas [name] MRN: [id number] DOB: 03/17/1961 Today's Date: 06/28/2022   History of Present Illness Pt is a 61 y.o. male who presented 06/27/22 with altered speech, R arm weakness,  right face decreased sensation, left leg weakness and numbness. MRI of lumbar spine-unchanged mild lower lumbar degenerative disc disease without spinal canal stenosis and unchanged mild right L4-5 neural foraminal stenosis. MRI of brain was negative. PMH: asthma, bipolar disorder, schizophrenia, chronic pain, seizures/pseudoseizures, conversion disorder, cervical myelopathy, GERD, hypertension, hyperlipidemia, hyperthyroidism, hemiplegic migraine, TIA, insulin-dependent type 2 diabetes, CKD stage IIIa, BPH   Clinical Impression   Pt admitted for concerns listed above. PTA pt reported that he was independent with all ADL's and IADL's, including driving. At this time, pt presents with increased LLE numbness and weakness. He requiring mod A for functional mobility and mod A for all LB ADL's. Recommending AIR, as pt is motivated to regain independence. OT will follow acutely.       Recommendations for follow up therapy are one component of a multi-disciplinary discharge planning process, led by the attending physician.  Recommendations may be updated based on patient status, additional functional criteria and insurance authorization.   Follow Up Recommendations  Acute inpatient rehab (3hours/day)    Assistance Recommended at Discharge Frequent or constant Supervision/Assistance  Patient can return home with the following A lot of help with walking and/or transfers;A lot of help with bathing/dressing/bathroom;Assistance with cooking/housework;Help with stairs or ramp for entrance;Assist for transportation    Functional Status Assessment  Patient has had a recent decline in their functional status and demonstrates the ability to make significant improvements in  function in a reasonable and predictable amount of time.  Equipment Recommendations  Other (comment) (TBD)    Recommendations for Other Services Rehab consult     Precautions / Restrictions Precautions Precautions: Fall Precaution Comments: L UE recent surgery with cast Restrictions Weight Bearing Restrictions: No      Mobility Bed Mobility Overal bed mobility: Needs Assistance Bed Mobility: Supine to Sit, Sit to Supine     Supine to sit: Min guard, HOB elevated Sit to supine: Min guard, HOB elevated   General bed mobility comments: OT had hand on LLE, however he was able to lif t and place it off the bed, as well as return it to the bed.    Transfers Overall transfer level: Needs assistance Equipment used: Rolling walker (2 wheels) Transfers: Sit to/from Stand Sit to Stand: Mod assist           General transfer comment: Mod A to power up and steady, unable to weight shift due to pt keeping all weight on RLE.      Balance Overall balance assessment: Needs assistance Sitting-balance support: No upper extremity supported, Feet supported Sitting balance-Leahy Scale: Good     Standing balance support: Bilateral upper extremity supported, During functional activity, Reliant on assistive device for balance Standing balance-Leahy Scale: Poor Standing balance comment: Reliant on bil UE support and up to mod A                           ADL either performed or assessed with clinical judgement   ADL Overall ADL's : Needs assistance/impaired Eating/Feeding: Set up;Sitting   Grooming: Set up;Sitting   Upper Body Bathing: Set up;Sitting   Lower Body Bathing: Min guard;Sitting/lateral leans;Moderate assistance;Sit to/from stand   Upper Body Dressing : Set up;Sitting   Lower Body Dressing:  Min guard;Sitting/lateral leans;Moderate assistance;Sit to/from stand   Toilet Transfer: Moderate assistance;Stand-pivot   Toileting- Clothing Manipulation and  Hygiene: Minimal assistance;Sitting/lateral lean         General ADL Comments: Pt is limtied due to LLE motor planning difficulties and weakness     Vision Baseline Vision/History: 0 No visual deficits Ability to See in Adequate Light: 0 Adequate Patient Visual Report: No change from baseline Vision Assessment?: No apparent visual deficits     Perception     Praxis      Pertinent Vitals/Pain Pain Assessment Pain Assessment: Faces Faces Pain Scale: Hurts a little bit Pain Location: L knee Pain Descriptors / Indicators: Discomfort, Grimacing Pain Intervention(s): Monitored during session, Limited activity within patient's tolerance, Repositioned     Hand Dominance Right   Extremity/Trunk Assessment Upper Extremity Assessment Upper Extremity Assessment: LUE deficits/detail LUE Deficits / Details: Casted from a surgery recently. LUE Sensation: WNL LUE Coordination: decreased fine motor   Lower Extremity Assessment Lower Extremity Assessment: Defer to PT evaluation LLE Deficits / Details: inconsistencies noted with AROM/strength as pt had pulled his knee up towards his chest quickly and smoothly when initially uncovering him but then when cued to perform heel slides he was barely able to with increased time, effort, and concentration; functional weakness noted intermittently; did not withdraw to painful/noxious stimuli distally and reported no sensation to touch LLE Sensation: decreased light touch LLE Coordination: decreased gross motor   Cervical / Trunk Assessment Cervical / Trunk Assessment: Normal   Communication Communication Communication: No difficulties   Cognition Arousal/Alertness: Awake/alert Behavior During Therapy: WFL for tasks assessed/performed Overall Cognitive Status: No family/caregiver present to determine baseline cognitive functioning                                       General Comments  VSS on RA    Exercises     Shoulder  Instructions      Home Living Family/patient expects to be discharged to:: Private residence Living Arrangements: Alone Available Help at Discharge: Family;Friend(s) Type of Home: House       Home Layout: One level     Bathroom Shower/Tub: Chief Strategy Officer: Standard     Home Equipment: Agricultural consultant (2 wheels);Cane - single point          Prior Functioning/Environment Prior Level of Function : Independent/Modified Independent             Mobility Comments: reports using a cane mostly ADLs Comments: Reports he had been independent until recent        OT Problem List: Decreased strength;Decreased activity tolerance;Impaired balance (sitting and/or standing);Decreased coordination;Decreased safety awareness;Decreased knowledge of use of DME or AE;Impaired sensation      OT Treatment/Interventions: Self-care/ADL training;Therapeutic exercise;Neuromuscular education;Energy conservation;DME and/or AE instruction;Therapeutic activities;Cognitive remediation/compensation;Patient/family education;Balance training    OT Goals(Current goals can be found in the care plan section) Acute Rehab OT Goals Patient Stated Goal: To get his strength back OT Goal Formulation: With patient Time For Goal Achievement: 07/12/22 Potential to Achieve Goals: Good ADL Goals Pt Will Perform Grooming: with modified independence;standing Pt Will Perform Lower Body Bathing: with modified independence;sitting/lateral leans;sit to/from stand Pt Will Perform Lower Body Dressing: with modified independence;sitting/lateral leans;sit to/from stand Pt Will Transfer to Toilet: with modified independence;ambulating Pt Will Perform Toileting - Clothing Manipulation and hygiene: with modified independence;sitting/lateral leans;sit to/from stand  OT Frequency: Min  2X/week    Co-evaluation              AM-PAC OT "6 Clicks" Daily Activity     Outcome Measure Help from another person  eating meals?: A Little Help from another person taking care of personal grooming?: A Little Help from another person toileting, which includes using toliet, bedpan, or urinal?: A Lot Help from another person bathing (including washing, rinsing, drying)?: A Lot Help from another person to put on and taking off regular upper body clothing?: A Little Help from another person to put on and taking off regular lower body clothing?: A Lot 6 Click Score: 15   End of Session Equipment Utilized During Treatment: Gait belt;Rolling walker (2 wheels) Nurse Communication: Mobility status  Activity Tolerance: Patient tolerated treatment well Patient left: in bed;with call bell/phone within reach  OT Visit Diagnosis: Unsteadiness on feet (R26.81);Other abnormalities of gait and mobility (R26.89);Muscle weakness (generalized) (M62.81);Other symptoms and signs involving the nervous system (R29.898)                Time: 0350-0938 OT Time Calculation (min): 25 min Charges:  OT General Charges $OT Visit: 1 Visit OT Evaluation $OT Eval Moderate Complexity: 1 Mod OT Treatments $Therapeutic Activity: 8-22 mins  Abbagale Goguen H., OTR/L Acute Rehabilitation  Kylie Gros Elane Yilin Weedon 06/28/2022, 4:25 PM

## 2022-06-29 ENCOUNTER — Observation Stay (HOSPITAL_COMMUNITY): Payer: Medicare Other

## 2022-06-29 DIAGNOSIS — N179 Acute kidney failure, unspecified: Secondary | ICD-10-CM | POA: Diagnosis not present

## 2022-06-29 DIAGNOSIS — R29898 Other symptoms and signs involving the musculoskeletal system: Secondary | ICD-10-CM | POA: Diagnosis not present

## 2022-06-29 DIAGNOSIS — N1832 Chronic kidney disease, stage 3b: Secondary | ICD-10-CM | POA: Diagnosis not present

## 2022-06-29 DIAGNOSIS — I10 Essential (primary) hypertension: Secondary | ICD-10-CM | POA: Diagnosis not present

## 2022-06-29 LAB — BASIC METABOLIC PANEL
Anion gap: 7 (ref 5–15)
BUN: 18 mg/dL (ref 6–20)
CO2: 21 mmol/L — ABNORMAL LOW (ref 22–32)
Calcium: 9.2 mg/dL (ref 8.9–10.3)
Chloride: 112 mmol/L — ABNORMAL HIGH (ref 98–111)
Creatinine, Ser: 1.31 mg/dL — ABNORMAL HIGH (ref 0.61–1.24)
GFR, Estimated: >60 mL/min (ref >60)
Glucose, Bld: 187 mg/dL — ABNORMAL HIGH (ref 70–99)
Potassium: 3.8 mmol/L (ref 3.5–5.1)
Sodium: 140 mmol/L (ref 135–145)

## 2022-06-29 LAB — GLUCOSE, CAPILLARY
Glucose-Capillary: 207 mg/dL — ABNORMAL HIGH (ref 70–99)
Glucose-Capillary: 138 mg/dL — ABNORMAL HIGH (ref 70–99)
Glucose-Capillary: 238 mg/dL — ABNORMAL HIGH (ref 70–99)
Glucose-Capillary: 351 mg/dL — ABNORMAL HIGH (ref 70–99)

## 2022-06-29 LAB — CBC
HCT: 35.9 % — ABNORMAL LOW (ref 39.0–52.0)
Hemoglobin: 11.6 g/dL — ABNORMAL LOW (ref 13.0–17.0)
MCH: 27.7 pg (ref 26.0–34.0)
MCHC: 32.3 g/dL (ref 30.0–36.0)
MCV: 85.7 fL (ref 80.0–100.0)
Platelets: 178 10*3/uL (ref 150–400)
RBC: 4.19 MIL/uL — ABNORMAL LOW (ref 4.22–5.81)
RDW: 15.3 % (ref 11.5–15.5)
WBC: 6.3 10*3/uL (ref 4.0–10.5)
nRBC: 0 % (ref 0.0–0.2)

## 2022-06-29 NOTE — TOC Initial Note (Signed)
Transition of Care [facility]) - Initial/Assessment Note    Patient Details  Name: Nicholas [name] MRN: [id number] Date of Birth: 1961/04/13  [facility] CM/SW Contact:    Tresa Endo Phone Number: 06/29/2022, 2:48 PM  Clinical Narrative:                 CSW received SNF consult. CSW met with pt at bedside. CSW introduced self and explained role at the hospital. Pt reports that PTA the pt lived at home alone and only had assistance from his pastor on Sunday's to go to church. PT reports before admission pt was min assist, pt is now has some L leg weakness but is still minA. Pt was able to ambulate 156f with a RW.  CSW reviewed PT/OT recommendations for SNF. Pt reports wanting to get PT so that he can get back to caring for himself and going back to finding a job. Pt gave CSW permission to fax out to facilities in the area. [facility] and his mother lived there for over 9 years.  CSW contacted [facility] no answer CSW left a VM.   CSW will continue to follow.    Expected Discharge Plan: Skilled Nursing Facility Barriers to Discharge: Continued Medical Work up   Patient Goals and CMS Choice Patient states their goals for this hospitalization and ongoing recovery are:: Rehab CMS Medicare.gov Compare Post Acute Care list provided to:: Patient Choice offered to / list presented to : Patient  Expected Discharge Plan and Services Expected Discharge Plan: [place] Referral: Clinical Social Work Discharge Planning Services: CM Consult Post Acute Care Choice: STerryLiving arrangements for the past 2 months: Apartment                                      Prior Living Arrangements/Services Living arrangements for the past 2 months: Apartment Lives with:: Self Patient language and need for interpreter reviewed:: Yes Do you feel safe going  back to the place where you live?: Yes      Need for Family Participation in Patient Care: Yes (Comment) Care giver support system in place?: Yes (comment)   Criminal Activity/Legal Involvement Pertinent to Current Situation/Hospitalization: No - Comment as needed  Activities of Daily Living      Permission Sought/Granted Permission sought to share information with : Facility CArt therapistgranted to share information with : Yes, Verbal Permission Granted     Permission granted to share info w AGENCY: SNF        Emotional Assessment Appearance:: Appears younger than stated age Attitude/Demeanor/Rapport: Engaged Affect (typically observed): Appropriate, Accepting Orientation: : Oriented to Self, Oriented to Place, Oriented to  Time, Oriented to Situation Alcohol / Substance Use: Not Applicable Psych Involvement: No (comment)  Admission diagnosis:  Slurred speech [R47.81] Left leg weakness [R29.898] Patient Active Problem List   Diagnosis Date Noted   Left leg weakness 078/29/5621  Metabolic acidosis 030/86/5784  Palpitations 05/08/2022   [place] 12/03/2020   Rectal bleeding 10/31/2020   AKI (acute kidney injury) ([place] 10/31/2020   Cardiomyopathy ([place] 09/23/2020   Atypical chest pain 09/23/2020   Precordial pain    CVA (cerebral vascular accident) ([place] 09/02/2020   Altered mental status 07/23/2020   [place]iz[place] ([place]  07/22/2020   Left-sided weakness 07/04/2020   Dysarthria 07/04/2020   Essential hypertension    Seizure disorder ([place]) 07/02/2017   Insulin-requiring or dependent type II diabetes mellitus ([place]) 07/02/2017   CKD (chronic kidney disease), stage III ([place]) 07/02/2017   Normocytic anemia 07/02/2017   Testicular/scrotal pain 11/09/2013   Microhematuria 11/09/2013   Condyloma acuminatum of scrotum 11/09/2013   PCP:  Arthur Holms, NP Pharmacy:   [facility] [place] [facility] [address] [place]  [place] [address] Phone: [phone] Fax: [phone]  [facility] [phone] - [place], [place] - [address] [place] [address] Phone: [phone] Fax: [phone]     Social Determinants of Health (SDOH) Interventions    Readmission Risk Interventions     No data to display

## 2022-06-29 NOTE — Progress Notes (Addendum)
Inpatient Rehab Admissions Coordinator:   Per therapy recommendations, patient was screened for CIR candidacy by Megan Salon, MS, CCC-SLP. At this time, Pt. does not appear to demonstrate medical necessity (all imaging negative and Pt. Is currently observation status )  to justify in hospital rehabilitation/CIR. Pt.'s insurance is also very unlikely to approve CIR for this diagnosis. I  will not pursue a rehab consult for this Pt.   Recommend other rehab venues to be pursued.  Please contact me with any questions.   Megan Salon, MS, CCC-SLP Rehab Admissions Coordinator  [phone] (celll) [phone] (office)

## 2022-06-29 NOTE — Progress Notes (Addendum)
PROGRESS NOTE    Nicholas [name]  WEX:[id number] DOB: Mar 05, 1961 DOA: 06/27/2022 PCP: Loura Back, NP   Chief Complaint  Patient presents with   Aphasia    Brief Narrative:  HPI per Dr. Nunzio Cobbs is a 61 y.o. male with medical history significant of asthma, bipolar disorder, schizophrenia, chronic pain, seizures/pseudoseizures, conversion disorder, cervical myelopathy, GERD, hypertension, hyperlipidemia, hyperthyroidism, hemiplegic migraine, TIA, insulin-dependent type 2 diabetes, CKD stage IIIa, BPH presented to the ED with altered speech, right arm weakness, right face decreased sensation, left leg weakness and numbness.  Vital signs stable.  Labs significant for WBC 11.2, bicarb 17, anion gap 11, BUN 23, creatinine 1.9 (baseline 1.3), glucose 225.  Blood ethanol level undetectable.  Lactic acid 3.2.  UDS negative.  UA without signs of infection.  Brain MRI negative for stroke. Patient was given Tylenol, Benadryl, Compazine, 500 cc LR bolus, Keppra, 1 L normal saline bolus. Neurology will consult and recommended MRI of lumbar spine.   Patient states his symptoms started at 4 AM this morning.  He is endorsing left lower extremity weakness and numbness, reports pins and needle sensation at the bottom of his foot.  Denies saddle anesthesia or bowel/bladder dysfunction.  Endorsing chronic low back pain.  Endorsing right upper extremity weakness, numbness, and tingling since this morning.  Also endorsing right-sided facial numbness and right-sided headaches since this morning.  In addition, endorsing slurred speech and confusion.  States he checked his blood sugar at home and the meter read "high."  States his PCP gave him a steroid injection yesterday for headaches and advised him to double up on the dose of his nighttime insulin.    Assessment & Plan:   Principal Problem:   Left leg weakness Active Problems:   Seizure disorder (HCC)   CKD (chronic kidney disease), stage III  (HCC)   Essential hypertension   AKI (acute kidney injury) (HCC)   Metabolic acidosis  #1 left lower extremity weakness and numbness/right facial numbness/right-sided headache -Head CT negative for any acute abnormalities. -MRI brain negative for any acute abnormalities. -MRI L-spine with unchanged mild lower lumbar degenerative disc disease without spinal canal stenosis/unchanged mild right L2 to 4-5 neuroforaminal stenosis -Patient seen by neurology who was following and recommending PT/OT. -Patient seen by PT/OT who are recommending SNF. -Patient states he felt a pop in his left knee when working with PT today and as such we will get plain films of the left knee. -Neurology feels some significant functional overlay remains with the possibility of neuropathy contributing to his symptoms but labs remain reassuring and suspect patient will make a good recovery. -Neurology recommending PT/OT, outpatient EMG in 2 to 4 weeks if no resolution of symptoms with outpatient follow-up with Dr. Irma Newness at [facility].  2.  AKI on CKD stage IIIa -Likely secondary to prerenal azotemia. -Improved with hydration.  3.  Lactic acidosis -Likely secondary to dehydration. -No signs or symptoms of infection. -Improved.  4.  Metabolic acidosis -Likely secondary to AKI. -Improved with hydration.  5.  Borderline leukocytosis  -Reactive.   -Improved.    6.  Asthma -Stable.  7.  Bipolar disorder/schizophrenia -Continue home regimen.  8.  Hypertension -Blood pressure was borderline on 06/28/2022.   -Blood pressure improved. -Resume verapamil.  9.  Hyperlipidemia -Continue statin.  10.  Poorly controlled type 2 diabetes mellitus, insulin-dependent, POA -Hemoglobin A1c 8.4 (06/27/2022) -CBG 207 this morning. -Continue Semglee 25 units, SSI.  #11 chronic neuropathic pain -Continue  gabapentin at home pain regimen.    DVT prophylaxis: Lovenox Code Status: Full Family  Communication: Updated patient.  No family at bedside. Disposition: SNF   Status is: Observation The patient remains OBS appropriate and will d/c before 2 midnights.   Consultants:  Neurology: Dr. Curly Shores 06/27/2022  Procedures:  CT head 06/27/2022 MRI brain 06/27/2022 MRI L-spine 06/27/2022   Antimicrobials:  None   Subjective: Laying in bed on the telephone.  Stated work with physical therapy today and while trying to ambulate in the hallway felt a pop in his left knee and felt heaviness all down his left leg.  No chest pain.  No shortness of breath.  No abdominal pain.     Objective: Vitals:   06/29/22 0541 06/29/22 0800 06/29/22 1024 06/29/22 1538  BP: 101/74 106/72  125/77  Pulse: 74 64  82  Resp: 19 18  18   Temp: 98.6 F (37 C) 97.8 F (36.6 C)  98.3 F (36.8 C)  TempSrc: Oral Oral  Oral  SpO2: 91% 95% 100% 98%  Weight:      Height:        Intake/Output Summary (Last 24 hours) at 06/29/2022 1615 Last data filed at 06/29/2022 1302 Gross per 24 hour  Intake 1913.13 ml  Output 2225 ml  Net -311.87 ml   Filed Weights   06/27/22 0847  Weight: 89.4 kg    Examination:  General exam: No acute distress. Respiratory system: Lungs clear to auscultation bilaterally.  No wheezes, no crackles, no rhonchi.  Fair air movement.  Speaking in full sentences.   Cardiovascular system: Regular rate rhythm no murmurs rubs or gallops.  No JVD.  No lower extremity edema. Gastrointestinal system: Abdomen soft, nontender, nondistended, positive bowel sounds.  No rebound.  No guarding. Central nervous system: Decreased sensation right face right upper extremity.  Alert and oriented.  Left lower extremity weakness however slowly improved.  Extremities: Symmetric 5 x 5 power. Skin: No rashes, lesions or ulcers Psychiatry: Judgement and insight appear normal. Mood & affect appropriate.     Data Reviewed: I have personally reviewed following labs and imaging studies  CBC: Recent  Labs  Lab 06/27/22 0828 06/27/22 0836 06/29/22 0138  WBC 11.2*  --  6.3  NEUTROABS 9.7*  --   --   HGB 13.4 14.6 11.6*  HCT 41.5 43.0 35.9*  MCV 85.6  --  85.7  PLT 218  --  178     Basic Metabolic Panel: Recent Labs  Lab 06/27/22 0828 06/27/22 0836 06/27/22 2317 06/29/22 0138  NA 136 140 141 140  K 4.8 4.7 3.8 3.8  CL 108 108 113* 112*  CO2 17*  --  21* 21*  GLUCOSE 225* 219* 131* 187*  BUN 23* 25* 20 18  CREATININE 1.90* 1.80* 1.41* 1.31*  CALCIUM 9.6  --  9.3 9.2  MG  --   --  1.7  --   PHOS  --   --  3.0  --      GFR: Estimated Creatinine Clearance: 64 mL/min (A) (by C-G formula based on SCr of 1.31 mg/dL (H)).  Liver Function Tests: Recent Labs  Lab 06/27/22 0828  AST 28  ALT 33  ALKPHOS 67  BILITOT 0.3  PROT 7.0  ALBUMIN 4.2     CBG: Recent Labs  Lab 06/28/22 1613 06/28/22 2131 06/29/22 0759 06/29/22 1147 06/29/22 1538  GLUCAP 185* 127* 207* 138* 238*      Recent Results (from the past 240 hour(s))  Resp Panel by RT-PCR (Flu A&B, Covid) Anterior Nasal Swab     Status: None   Collection Time: 06/27/22  8:28 AM   Specimen: Anterior Nasal Swab  Result Value Ref Range Status   SARS Coronavirus 2 by RT PCR NEGATIVE NEGATIVE Final    Comment: (NOTE) SARS-CoV-2 target nucleic acids are NOT DETECTED.  The SARS-CoV-2 RNA is generally detectable in upper respiratory specimens during the acute phase of infection. The lowest concentration of SARS-CoV-2 viral copies this assay can detect is 138 copies/mL. A negative result does not preclude SARS-Cov-2 infection and should not be used as the sole basis for treatment or other patient management decisions. A negative result may occur with  improper specimen collection/handling, submission of specimen other than nasopharyngeal swab, presence of viral mutation(s) within the areas targeted by this assay, and inadequate number of viral copies(<138 copies/mL). A negative result must be combined  with clinical observations, patient history, and epidemiological information. The expected result is Negative.  Fact Sheet for Patients:  BloggerCourse.com  Fact Sheet for Healthcare Providers:  SeriousBroker.it  This test is no t yet approved or cleared by the [place] FDA and  has been authorized for detection and/or diagnosis of SARS-CoV-2 by FDA under an Emergency Use Authorization (EUA). This EUA will remain  in effect (meaning this test can be used) for the duration of the COVID-19 declaration under Section 564(b)(1) of the Act, 21 U.S.C.section 360bbb-3(b)(1), unless the authorization is terminated  or revoked sooner.       Influenza A by PCR NEGATIVE NEGATIVE Final   Influenza B by PCR NEGATIVE NEGATIVE Final    Comment: (NOTE) The Xpert Xpress SARS-CoV-2/FLU/RSV plus assay is intended as an aid in the diagnosis of influenza from Nasopharyngeal swab specimens and should not be used as a sole basis for treatment. Nasal washings and aspirates are unacceptable for Xpert Xpress SARS-CoV-2/FLU/RSV testing.  Fact Sheet for Patients: BloggerCourse.com  Fact Sheet for Healthcare Providers: SeriousBroker.it  This test is not yet approved or cleared by the [place] FDA and has been authorized for detection and/or diagnosis of SARS-CoV-2 by FDA under an Emergency Use Authorization (EUA). This EUA will remain in effect (meaning this test can be used) for the duration of the COVID-19 declaration under Section 564(b)(1) of the Act, 21 U.S.C. section 360bbb-3(b)(1), unless the authorization is terminated or revoked.  Performed at [facility], [address], [place], [place] [address]          Radiology Studies: MR LUMBAR SPINE WO CONTRAST  Result Date: 06/27/2022 CLINICAL DATA:  Low back pain EXAM: MRI LUMBAR SPINE WITHOUT CONTRAST TECHNIQUE: Multiplanar,  multisequence MR imaging of the lumbar spine was performed. No intravenous contrast was administered. COMPARISON:  None Available. FINDINGS: Segmentation:  Standard. Alignment:  Physiologic. Vertebrae:  No fracture, evidence of discitis, or bone lesion. Conus medullaris and cauda equina: Conus extends to the L2 level. Conus and cauda equina appear normal. Paraspinal and other soft tissues: Negative Disc levels: L1-L2: Normal disc space and facet joints. No spinal canal stenosis. No neural foraminal stenosis. L2-L3: Normal disc space and facet joints. No spinal canal stenosis. No neural foraminal stenosis. L3-L4: Normal disc space and facet joints. No spinal canal stenosis. No neural foraminal stenosis. L4-L5: Unchanged small disc bulge. No spinal canal stenosis. Mild right neural foraminal stenosis. L5-S1: Small disc bulge. No spinal canal stenosis. No neural foraminal stenosis. Visualized sacrum: Normal. IMPRESSION: 1. Unchanged mild lower lumbar degenerative disc disease without spinal  canal stenosis. 2. Unchanged mild right L4-5 neural foraminal stenosis. Electronically Signed   By: Ulyses Jarred M.D.   On: 06/27/2022 22:35        Scheduled Meds:  atorvastatin  80 mg Oral QHS   clopidogrel  75 mg Oral Daily   enoxaparin (LOVENOX) injection  40 mg Subcutaneous Q24H   fenofibrate  54 mg Oral Daily   gabapentin  600 mg Oral TID   insulin aspart  0-15 Units Subcutaneous TID WC   insulin aspart  0-5 Units Subcutaneous QHS   insulin glargine-yfgn  25 Units Subcutaneous QHS   latanoprost  1 drop Both Eyes QHS   lurasidone  20 mg Oral QHS   mometasone-formoterol  2 puff Inhalation BID   nortriptyline  100 mg Oral QHS   timolol  1 drop Both Eyes Daily   traZODone  100 mg Oral QHS   verapamil  120 mg Oral Daily   zonisamide  200 mg Oral BID   Continuous Infusions:     LOS: 0 days    Time spent: 35 minutes    Irine Seal, MD [facility]   To contact the attending provider  between 7A-7P or the covering provider during after hours 7P-7A, please log into the web site [email or web address] and access using universal [place] password for that web site. If you do not have the password, please call the hospital operator.  06/29/2022, 4:15 PM

## 2022-06-29 NOTE — Progress Notes (Signed)
Physical Therapy Treatment Patient Details Name: Nicholas [name] MRN: [id number] DOB: [birth year] Today's Date: 06/29/2022   History of Present Illness Pt is a 61 y.o. male who presented 06/27/22 with altered speech, R arm weakness,  right face decreased sensation, left leg weakness and numbness. MRI of lumbar spine-unchanged mild lower lumbar degenerative disc disease without spinal canal stenosis and unchanged mild right L4-5 neural foraminal stenosis. MRI of brain was negative. PMH: asthma, bipolar disorder, schizophrenia, chronic pain, seizures/pseudoseizures, conversion disorder, cervical myelopathy, GERD, hypertension, hyperlipidemia, hyperthyroidism, hemiplegic migraine, TIA, insulin-dependent type 2 diabetes, CKD stage IIIa, BPH    PT Comments    Patient progressing with hallway ambulation and some improvement with L LE strength only couple episodes of buckling today and seems to need less assist to stand.  Patient eager still for rehab and noted not able to go here for rehab.  Feel he may need STSNF level rehab at d/c.  PT will continue to follow.    Recommendations for follow up therapy are one component of a multi-disciplinary discharge planning process, led by the attending physician.  Recommendations may be updated based on patient status, additional functional criteria and insurance authorization.  Follow Up Recommendations  Skilled nursing-short term rehab (<3 hours/day) Can patient physically be transported by private vehicle: Yes   Assistance Recommended at Discharge Frequent or constant Supervision/Assistance  Patient can return home with the following A little help with walking and/or transfers;A little help with bathing/dressing/bathroom;Assistance with cooking/housework;Assist for transportation;Help with stairs or ramp for entrance   Equipment Recommendations       Recommendations for Other Services       Precautions / Restrictions Precautions Precautions:  Fall Precaution Comments: L UE recent surgery with cast     Mobility  Bed Mobility Overal bed mobility: Needs Assistance Bed Mobility: Supine to Sit, Sit to Supine     Supine to sit: Min guard Sit to supine: Min guard   General bed mobility comments: assist for L LE    Transfers Overall transfer level: Needs assistance Equipment used: Rolling walker (2 wheels) Transfers: Sit to/from Stand Sit to Stand: Min assist           General transfer comment: up from EOB, then down and up from toilet in bathroom with A and cues for using rail on the wall    Ambulation/Gait Ambulation/Gait assistance: Min assist Gait Distance (Feet): 100 Feet Assistive device: Rolling walker (2 wheels) Gait Pattern/deviations: Step-to pattern, Step-through pattern, Decreased stride length, Decreased dorsiflexion - left       General Gait Details: one to two episodes L LE buckling with little heavier min A for recovery; moving L LE mostly clearing the floor, at times scuffling the floor.  No LOB on turns either   Stairs             Wheelchair Mobility    Modified Rankin (Stroke Patients Only)       Balance Overall balance assessment: Needs assistance Sitting-balance support: Feet supported Sitting balance-Leahy Scale: Good     Standing balance support: Bilateral upper extremity supported Standing balance-Leahy Scale: Poor Standing balance comment: min a for balance while washing hands in sink after toileting                            Cognition Arousal/Alertness: Awake/alert Behavior During Therapy: WFL for tasks assessed/performed Overall Cognitive Status: Within Functional Limits for tasks assessed  Exercises Other Exercises Other Exercises: seated hip flexion x 10 Other Exercises: seated heel and toe raises x 10 each leg    General Comments General comments (skin integrity, edema, etc.): Discussed  issues with getting to rehab here and pt voiced interest in [name] for rehab, SW made aware.      Pertinent Vitals/Pain Pain Assessment Pain Assessment: Faces Faces Pain Scale: Hurts little more Pain Location: L LE Pain Descriptors / Indicators: Burning, Tightness, Tingling Pain Intervention(s): Monitored during session, Repositioned    Home Living                          Prior Function            PT Goals (current goals can now be found in the care plan section) Progress towards PT goals: Progressing toward goals    Frequency    Min 3X/week      PT Plan Discharge plan needs to be updated;Frequency needs to be updated    Co-evaluation              AM-PAC PT "6 Clicks" Mobility   Outcome Measure  Help needed turning from your back to your side while in a flat bed without using bedrails?: A Little Help needed moving from lying on your back to sitting on the side of a flat bed without using bedrails?: A Little Help needed moving to and from a bed to a chair (including a wheelchair)?: A Little Help needed standing up from a chair using your arms (e.g., wheelchair or bedside chair)?: A Little Help needed to walk in hospital room?: A Little Help needed climbing 3-5 steps with a railing? : Total 6 Click Score: 16    End of Session Equipment Utilized During Treatment: Gait belt Activity Tolerance: Patient limited by fatigue Patient left: in bed;with call bell/phone within reach   PT Visit Diagnosis: Unsteadiness on feet (R26.81);Other abnormalities of gait and mobility (R26.89);Muscle weakness (generalized) (M62.81);Difficulty in walking, not elsewhere classified (R26.2)     Time: 1638-4665 PT Time Calculation (min) (ACUTE ONLY): 28 min  Charges:  $Gait Training: 8-22 mins $Therapeutic Activity: 8-22 mins                     Nicholas [name], PT Acute Rehabilitation Services Office:[phone] 06/29/2022    Nicholas [name] 06/29/2022, 1:48  PM

## 2022-06-29 NOTE — Progress Notes (Signed)
Pt already on CPAP for bed. RT will cont to monitor as needed.

## 2022-06-30 DIAGNOSIS — I10 Essential (primary) hypertension: Secondary | ICD-10-CM | POA: Diagnosis not present

## 2022-06-30 DIAGNOSIS — N179 Acute kidney failure, unspecified: Secondary | ICD-10-CM | POA: Diagnosis not present

## 2022-06-30 DIAGNOSIS — R29898 Other symptoms and signs involving the musculoskeletal system: Secondary | ICD-10-CM | POA: Diagnosis not present

## 2022-06-30 DIAGNOSIS — N1832 Chronic kidney disease, stage 3b: Secondary | ICD-10-CM | POA: Diagnosis not present

## 2022-06-30 LAB — MISC LABCORP TEST (SEND OUT)
LabCorp test name: 7915
Labcorp test code: 9985

## 2022-06-30 LAB — GLUCOSE, CAPILLARY
Glucose-Capillary: 208 mg/dL — ABNORMAL HIGH (ref 70–99)
Glucose-Capillary: 236 mg/dL — ABNORMAL HIGH (ref 70–99)
Glucose-Capillary: 286 mg/dL — ABNORMAL HIGH (ref 70–99)
Glucose-Capillary: 266 mg/dL — ABNORMAL HIGH (ref 70–99)

## 2022-06-30 MED ORDER — INSULIN GLARGINE-YFGN 100 UNIT/ML ~~LOC~~ SOLN
30.0000 [IU] | Freq: Every day | SUBCUTANEOUS | Status: DC
  Administered 2022-06-30: 30 [IU] via SUBCUTANEOUS
  Filled 2022-06-30 (×2): qty 0.3

## 2022-06-30 NOTE — Social Work (Signed)
RE: Nicholas [name] Date of Birth: 1961/11/13 Date: 06/30/2022  Please be advised that the above-named patient will require a short-term nursing home stay - anticipated 30 days or less for rehabilitation and strengthening.  The plan is for return home.

## 2022-06-30 NOTE — Progress Notes (Signed)
Pt placed on CPAP for bed. RT will cont to monitor as needed.  

## 2022-06-30 NOTE — Inpatient Diabetes Management (Signed)
Inpatient Diabetes Program Recommendations  AACE/ADA: New Consensus Statement on Inpatient Glycemic Control   Target Ranges:  Prepandial:   less than 140 mg/dL      Peak postprandial:   less than 180 mg/dL (1-2 hours)      Critically ill patients:  140 - 180 mg/dL    Latest Reference Range & Units 06/29/22 07:59 06/29/22 11:47 06/29/22 15:38 06/29/22 22:02 06/30/22 07:54  Glucose-Capillary 70 - 99 mg/dL 803 (H) 212 (H) 248 (H) 351 (H) 208 (H)   Review of Glycemic Control  Diabetes history: DM2 Outpatient Diabetes medications: Toujeo 25 units QHS, Humalog 0-16 units TID with meals, Metformin 1000 mg BID Current orders for Inpatient glycemic control: Semglee 25 units QHS, Novolog 0-15 units TID with meals, Novolog 0-5 units QHS  Inpatient Diabetes Program Recommendations:    Insulin: Please consider increasing Semglee to 30 units QHS and ordering Novolog 3 units TID with meals for meal coverage if patient eats at least 50% of meals.   Thanks, Orlando Penner, RN, MSN, CDCES Diabetes Coordinator Inpatient Diabetes Program [phone] (Team Pager from 8am to 5pm)

## 2022-06-30 NOTE — Progress Notes (Signed)
Physical Therapy Treatment Patient Details Name: Nicholas [name] MRN: [id number] DOB: [birth year] Today's Date: 06/30/2022   History of Present Illness Pt is a 61 y.o. male who presented 06/27/22 with altered speech, R arm weakness,  right face decreased sensation, left leg weakness and numbness. MRI of lumbar spine-unchanged mild lower lumbar degenerative disc disease without spinal canal stenosis and unchanged mild right L4-5 neural foraminal stenosis. MRI of brain was negative. PMH: asthma, bipolar disorder, schizophrenia, chronic pain, seizures/pseudoseizures, conversion disorder, cervical myelopathy, GERD, hypertension, hyperlipidemia, hyperthyroidism, hemiplegic migraine, TIA, insulin-dependent type 2 diabetes, CKD stage IIIa, BPH    PT Comments    Patient progressing some with ambulation distance and L LE strength.  Still needed me to help him move it to EOB, but no buckling noted today.  Still reports pain in the leg with burning and tingling with mobility.  Patient remains appropriate for STSNF level rehab as previously was independent.  PT will continue to follow acutely.    Recommendations for follow up therapy are one component of a multi-disciplinary discharge planning process, led by the attending physician.  Recommendations may be updated based on patient status, additional functional criteria and insurance authorization.  Follow Up Recommendations  Skilled nursing-short term rehab (<3 hours/day) Can patient physically be transported by private vehicle: Yes   Assistance Recommended at Discharge Frequent or constant Supervision/Assistance  Patient can return home with the following A little help with walking and/or transfers;A little help with bathing/dressing/bathroom;Assistance with cooking/housework;Assist for transportation;Help with stairs or ramp for entrance   Equipment Recommendations  Other (comment);BSC/3in1    Recommendations for Other Services       Precautions /  Restrictions Precautions Precautions: Fall Precaution Comments: L UE recent surgery with cast     Mobility  Bed Mobility Overal bed mobility: Needs Assistance Bed Mobility: Supine to Sit     Supine to sit: Min guard     General bed mobility comments: assist for L LE    Transfers Overall transfer level: Needs assistance Equipment used: Rolling walker (2 wheels) Transfers: Sit to/from Stand Sit to Stand: Min assist           General transfer comment: assist for balance and anterior weight shift    Ambulation/Gait Ambulation/Gait assistance: Min assist Gait Distance (Feet): 125 Feet Assistive device: Rolling walker (2 wheels) Gait Pattern/deviations: Step-to pattern, Step-through pattern, Decreased stride length, Decreased dorsiflexion - left       General Gait Details: no episodes of L LE buckling today, pt initially more antalgic, but improved with distance.  Some walker instability still with cast on L hand so needing help for safety/turns.   Stairs             Wheelchair Mobility    Modified Rankin (Stroke Patients Only)       Balance Overall balance assessment: Needs assistance Sitting-balance support: Feet supported Sitting balance-Leahy Scale: Good     Standing balance support: No upper extremity supported, During functional activity Standing balance-Leahy Scale: Fair Standing balance comment: washing hands at sink today with close S, no UE support, possibly leaning into sink                            Cognition Arousal/Alertness: Awake/alert Behavior During Therapy: Flat affect Overall Cognitive Status: Within Functional Limits for tasks assessed  Exercises      General Comments General comments (skin integrity, edema, etc.): Toileted after ambulation, declined brusing teeth, reported already done today.      Pertinent Vitals/Pain Pain Assessment Faces Pain  Scale: Hurts little more Pain Location: L LE Pain Descriptors / Indicators: Burning, Tightness, Tingling Pain Intervention(s): Monitored during session, Repositioned    Home Living                          Prior Function            PT Goals (current goals can now be found in the care plan section) Progress towards PT goals: Progressing toward goals    Frequency    Min 3X/week      PT Plan Current plan remains appropriate    Co-evaluation              AM-PAC PT "6 Clicks" Mobility   Outcome Measure  Help needed turning from your back to your side while in a flat bed without using bedrails?: A Little Help needed moving from lying on your back to sitting on the side of a flat bed without using bedrails?: A Little Help needed moving to and from a bed to a chair (including a wheelchair)?: A Little Help needed standing up from a chair using your arms (e.g., wheelchair or bedside chair)?: A Little Help needed to walk in hospital room?: A Little Help needed climbing 3-5 steps with a railing? : Total 6 Click Score: 16    End of Session Equipment Utilized During Treatment: Gait belt Activity Tolerance: Patient limited by fatigue Patient left: in chair;with call bell/phone within reach;with chair alarm set   PT Visit Diagnosis: Unsteadiness on feet (R26.81);Other abnormalities of gait and mobility (R26.89);Muscle weakness (generalized) (M62.81);Difficulty in walking, not elsewhere classified (R26.2)     Time: 6468-0321 PT Time Calculation (min) (ACUTE ONLY): 15 min  Charges:  $Gait Training: 8-22 mins                     Sheran Lawless, PT Acute Rehabilitation Services Office:[phone] 06/30/2022    Nicholas [name] 06/30/2022, 3:07 PM

## 2022-06-30 NOTE — Progress Notes (Signed)
PROGRESS NOTE    Nicholas [name]  DPO:[id number] DOB: 14-Jul-1961 DOA: 06/27/2022 PCP: Loura Back, NP   Chief Complaint  Patient presents with   Aphasia    Brief Narrative:  HPI per Dr. Nunzio Cobbs is a 61 y.o. male with medical history significant of asthma, bipolar disorder, schizophrenia, chronic pain, seizures/pseudoseizures, conversion disorder, cervical myelopathy, GERD, hypertension, hyperlipidemia, hyperthyroidism, hemiplegic migraine, TIA, insulin-dependent type 2 diabetes, CKD stage IIIa, BPH presented to the ED with altered speech, right arm weakness, right face decreased sensation, left leg weakness and numbness.  Vital signs stable.  Labs significant for WBC 11.2, bicarb 17, anion gap 11, BUN 23, creatinine 1.9 (baseline 1.3), glucose 225.  Blood ethanol level undetectable.  Lactic acid 3.2.  UDS negative.  UA without signs of infection.  Brain MRI negative for stroke. Patient was given Tylenol, Benadryl, Compazine, 500 cc LR bolus, Keppra, 1 L normal saline bolus. Neurology will consult and recommended MRI of lumbar spine.   Patient states his symptoms started at 4 AM this morning.  He is endorsing left lower extremity weakness and numbness, reports pins and needle sensation at the bottom of his foot.  Denies saddle anesthesia or bowel/bladder dysfunction.  Endorsing chronic low back pain.  Endorsing right upper extremity weakness, numbness, and tingling since this morning.  Also endorsing right-sided facial numbness and right-sided headaches since this morning.  In addition, endorsing slurred speech and confusion.  States he checked his blood sugar at home and the meter read "high."  States his PCP gave him a steroid injection yesterday for headaches and advised him to double up on the dose of his nighttime insulin.    Assessment & Plan:   Principal Problem:   Left leg weakness Active Problems:   Seizure disorder (HCC)   CKD (chronic kidney disease), stage III  (HCC)   Essential hypertension   AKI (acute kidney injury) (HCC)   Metabolic acidosis  #1 left lower extremity weakness and numbness/right facial numbness/right-sided headache -Head CT negative for any acute abnormalities. -MRI brain negative for any acute abnormalities. -MRI L-spine with unchanged mild lower lumbar degenerative disc disease without spinal canal stenosis/unchanged mild right L2 to 4-5 neuroforaminal stenosis -Patient seen by neurology who was following and recommending PT/OT. -Patient seen by PT/OT who are recommending SNF. -Patient states he felt a pop in his left knee when working with PT today and as such we will get plain films of the left knee. -Neurology feels some significant functional overlay remains with the possibility of neuropathy contributing to his symptoms but labs remain reassuring and suspect patient will make a good recovery. -Neurology recommending PT/OT, outpatient EMG in 2 to 4 weeks if no resolution of symptoms with outpatient follow-up with Dr. Irma Newness at [facility]. -Awaiting SNF placement.  2.  AKI on CKD stage IIIa -Likely secondary to prerenal azotemia. -Improved with hydration and likely at baseline. -Follow.  3.  Lactic acidosis -Likely secondary to dehydration. -No signs or symptoms of infection. -Improved.  4.  Metabolic acidosis -Likely secondary to AKI. -Improved with hydration.  5.  Borderline leukocytosis  -Reactive.   Resolved.  6.  Asthma -Stable.  7.  Bipolar disorder/schizophrenia -Continue home regimen.  8.  Hypertension -Blood pressure was borderline on 06/28/2022.   -Blood pressure improved. -Continue verapamil and monitor BP.  9.  Hyperlipidemia -Continue fenofibrate, statin.  10.  Poorly controlled type 2 diabetes mellitus, insulin-dependent, POA -Hemoglobin A1c 8.4 (06/27/2022) -CBG 208 this morning. -Increase  Semglee to 30 units daily.  SSI.  #11 chronic neuropathic pain -Continue  gabapentin at home pain regimen.    DVT prophylaxis: Lovenox Code Status: Full Family Communication: Updated patient.  No family at bedside. Disposition: Patient currently medically stable.  Awaiting SNF placement.  Status is: Observation The patient remains OBS appropriate and will d/c before 2 midnights.   Consultants:  Neurology: Dr. Iver Nestle 06/27/2022  Procedures:  CT head 06/27/2022 MRI brain 06/27/2022 MRI L-spine 06/27/2022   Antimicrobials:  None   Subjective: Patient sitting up in chair.  States numbness and tingling on the right face improving.  Left lower extremity weakness slowly improving.  Was able to work with physical therapy today.    Objective: Vitals:   06/29/22 2336 06/30/22 0449 06/30/22 0754 06/30/22 1534  BP:  106/77 104/81 105/73  Pulse: 80 80 80 88  Resp: 16 16 17 17   Temp:  98.1 F (36.7 C) 98.2 F (36.8 C) 98 F (36.7 C)  TempSrc:  Oral Oral Oral  SpO2: 96% 96% 99% 97%  Weight:      Height:        Intake/Output Summary (Last 24 hours) at 06/30/2022 1600 Last data filed at 06/30/2022 1052 Gross per 24 hour  Intake 600 ml  Output 775 ml  Net -175 ml    Filed Weights   06/27/22 0847  Weight: 89.4 kg    Examination:  General exam: NAD Respiratory system: CTA B.  No wheezes, no crackles, no rhonchi.  Fair air movement.  Speaking full sentences. Cardiovascular system: RRR no murmurs rubs or gallops.  No JVD.  No lower extremity edema.  Gastrointestinal system: Abdomen is soft, nontender, nondistended, positive bowel sounds.  No rebound.  No guarding. Central nervous system: Decreased sensation right face right upper extremity.  Alert and oriented.  Left lower extremity weakness however improving daily. Extremities: Symmetric 5 x 5 power. Skin: No rashes, lesions or ulcers Psychiatry: Judgement and insight appear normal. Mood & affect appropriate.     Data Reviewed: I have personally reviewed following labs and imaging  studies  CBC: Recent Labs  Lab 06/27/22 0828 06/27/22 0836 06/29/22 0138  WBC 11.2*  --  6.3  NEUTROABS 9.7*  --   --   HGB 13.4 14.6 11.6*  HCT 41.5 43.0 35.9*  MCV 85.6  --  85.7  PLT 218  --  178     Basic Metabolic Panel: Recent Labs  Lab 06/27/22 0828 06/27/22 0836 06/27/22 2317 06/29/22 0138  NA 136 140 141 140  K 4.8 4.7 3.8 3.8  CL 108 108 113* 112*  CO2 17*  --  21* 21*  GLUCOSE 225* 219* 131* 187*  BUN 23* 25* 20 18  CREATININE 1.90* 1.80* 1.41* 1.31*  CALCIUM 9.6  --  9.3 9.2  MG  --   --  1.7  --   PHOS  --   --  3.0  --      GFR: Estimated Creatinine Clearance: 64 mL/min (A) (by C-G formula based on SCr of 1.31 mg/dL (H)).  Liver Function Tests: Recent Labs  Lab 06/27/22 0828  AST 28  ALT 33  ALKPHOS 67  BILITOT 0.3  PROT 7.0  ALBUMIN 4.2     CBG: Recent Labs  Lab 06/29/22 1538 06/29/22 2202 06/30/22 0754 06/30/22 1247 06/30/22 1535  GLUCAP 238* 351* 208* 236* 286*      Recent Results (from the past 240 hour(s))  Resp Panel by RT-PCR (Flu A&B, Covid)  Anterior Nasal Swab     Status: None   Collection Time: 06/27/22  8:28 AM   Specimen: Anterior Nasal Swab  Result Value Ref Range Status   SARS Coronavirus 2 by RT PCR NEGATIVE NEGATIVE Final    Comment: (NOTE) SARS-CoV-2 target nucleic acids are NOT DETECTED.  The SARS-CoV-2 RNA is generally detectable in upper respiratory specimens during the acute phase of infection. The lowest concentration of SARS-CoV-2 viral copies this assay can detect is 138 copies/mL. A negative result does not preclude SARS-Cov-2 infection and should not be used as the sole basis for treatment or other patient management decisions. A negative result may occur with  improper specimen collection/handling, submission of specimen other than nasopharyngeal swab, presence of viral mutation(s) within the areas targeted by this assay, and inadequate number of viral copies(<138 copies/mL). A negative result  must be combined with clinical observations, patient history, and epidemiological information. The expected result is Negative.  Fact Sheet for Patients:  BloggerCourse.com  Fact Sheet for Healthcare Providers:  SeriousBroker.it  This test is no t yet approved or cleared by the [place] FDA and  has been authorized for detection and/or diagnosis of SARS-CoV-2 by FDA under an Emergency Use Authorization (EUA). This EUA will remain  in effect (meaning this test can be used) for the duration of the COVID-19 declaration under Section 564(b)(1) of the Act, 21 U.S.C.section 360bbb-3(b)(1), unless the authorization is terminated  or revoked sooner.       Influenza A by PCR NEGATIVE NEGATIVE Final   Influenza B by PCR NEGATIVE NEGATIVE Final    Comment: (NOTE) The Xpert Xpress SARS-CoV-2/FLU/RSV plus assay is intended as an aid in the diagnosis of influenza from Nasopharyngeal swab specimens and should not be used as a sole basis for treatment. Nasal washings and aspirates are unacceptable for Xpert Xpress SARS-CoV-2/FLU/RSV testing.  Fact Sheet for Patients: BloggerCourse.com  Fact Sheet for Healthcare Providers: SeriousBroker.it  This test is not yet approved or cleared by the [place] FDA and has been authorized for detection and/or diagnosis of SARS-CoV-2 by FDA under an Emergency Use Authorization (EUA). This EUA will remain in effect (meaning this test can be used) for the duration of the COVID-19 declaration under Section 564(b)(1) of the Act, 21 U.S.C. section 360bbb-3(b)(1), unless the authorization is terminated or revoked.  Performed at [facility], [address], [place], [place] [address]          Radiology Studies: DG Knee Complete 4 Views Left  Result Date: 06/29/2022 CLINICAL DATA:  Left knee pain after remote trauma 25 years ago EXAM: LEFT  KNEE - COMPLETE 4+ VIEW COMPARISON:  None Available. FINDINGS: Mild medial compartment joint space narrowing and subchondral sclerosis. Chondrocalcinosis within the lateral meniscus. No acute fracture or dislocation. No joint effusion. IMPRESSION: 1. Mild medial compartment osteoarthritis. 2. Lateral meniscal chondrocalcinosis, consistent with calcium pyrophosphate deposition disease. Electronically Signed   By: Jeronimo Greaves M.D.   On: 06/29/2022 17:02        Scheduled Meds:  atorvastatin  80 mg Oral QHS   clopidogrel  75 mg Oral Daily   enoxaparin (LOVENOX) injection  40 mg Subcutaneous Q24H   fenofibrate  54 mg Oral Daily   gabapentin  600 mg Oral TID   insulin aspart  0-15 Units Subcutaneous TID WC   insulin aspart  0-5 Units Subcutaneous QHS   insulin glargine-yfgn  30 Units Subcutaneous QHS   latanoprost  1 drop Both Eyes QHS  lurasidone  20 mg Oral QHS   mometasone-formoterol  2 puff Inhalation BID   nortriptyline  100 mg Oral QHS   timolol  1 drop Both Eyes Daily   traZODone  100 mg Oral QHS   verapamil  120 mg Oral Daily   zonisamide  200 mg Oral BID   Continuous Infusions:     LOS: 0 days    Time spent: 35 minutes    Ramiro Harvest, MD [facility]   To contact the attending provider between 7A-7P or the covering provider during after hours 7P-7A, please log into the web site [email or web address] and access using universal [place] password for that web site. If you do not have the password, please call the hospital operator.  06/30/2022, 4:00 PM

## 2022-06-30 NOTE — TOC Progression Note (Addendum)
Transition of Care [facility]) - Progression Note    Patient Details  Name: HUY MAJID MRN: [id number] Date of Birth: [birth year]  Transition of Care [facility]) CM/SW Contact  Ivette Loyal, [place] Phone Number: 06/30/2022, 3:02 PM  Clinical Narrative:    [name] is still reviewing pt information. CSW will continue to follow.  [facility] is able to offer bed for pt. CSW will start auth.   Expected Discharge Plan: Skilled Nursing Facility Barriers to Discharge: Continued Medical Work up  Expected Discharge Plan and Services Expected Discharge Plan: Skilled Nursing Facility In-house Referral: Clinical Social Work Discharge Planning Services: CM Consult Post Acute Care Choice: Skilled Nursing Facility Living arrangements for the past 2 months: Apartment                                       Social Determinants of Health (SDOH) Interventions    Readmission Risk Interventions     No data to display

## 2022-06-30 NOTE — NC FL2 (Addendum)
[place] MEDICAID FL2 LEVEL OF CARE SCREENING TOOL     IDENTIFICATION  Patient Name: Nicholas [name] Birthdate: 1961-10-23 Sex: male Admission Date (Current Location): 06/27/2022  [facility] and [place] Number:  Producer, television/film/video and Address:  [facility], [address] [place], [place] [address]      Provider Number: [id number]  Attending Physician Name and Address:  Rodolph Bong, MD  Relative Name and Phone Number:       Current Level of Care: Hospital Recommended Level of Care: Skilled Nursing Facility Prior Approval Number:    Date Approved/Denied:   PASRR Number: [id number]  Discharge Plan: SNF    Current Diagnoses: Patient Active Problem List   Diagnosis Date Noted   Left leg weakness 06/27/2022   Metabolic acidosis 06/27/2022   Palpitations 05/08/2022   Seizures (HCC) 12/03/2020   Rectal bleeding 10/31/2020   AKI (acute kidney injury) (HCC) 10/31/2020   Cardiomyopathy (HCC) 09/23/2020   Atypical chest pain 09/23/2020   Precordial pain    CVA (cerebral vascular accident) (HCC) 09/02/2020   Altered mental status 07/23/2020   Seizure (HCC) 07/22/2020   Left-sided weakness 07/04/2020   Dysarthria 07/04/2020   Essential hypertension    Seizure disorder (HCC) 07/02/2017   Insulin-requiring or dependent type II diabetes mellitus (HCC) 07/02/2017   CKD (chronic kidney disease), stage III (HCC) 07/02/2017   Normocytic anemia 07/02/2017   Testicular/scrotal pain 11/09/2013   Microhematuria 11/09/2013   Condyloma acuminatum of scrotum 11/09/2013    Orientation RESPIRATION BLADDER Height & Weight     Self, Time, Situation, Place  Normal Continent Weight: 197 lb (89.4 kg) Height:  5\' 7"  (170.2 cm)  BEHAVIORAL SYMPTOMS/MOOD NEUROLOGICAL BOWEL NUTRITION STATUS      Continent Diet (See DC  Summary)  AMBULATORY STATUS COMMUNICATION OF NEEDS Skin   Limited Assist Verbally Normal                       Personal Care  Assistance Level of Assistance  Bathing, Feeding, Dressing Bathing Assistance: Limited assistance Feeding assistance: Independent Dressing Assistance: Limited assistance     Functional Limitations Info  Sight, Hearing, Speech Sight Info: Adequate Hearing Info: Adequate Speech Info: Adequate    SPECIAL CARE FACTORS FREQUENCY  PT (By licensed PT), OT (By licensed OT)     PT Frequency: 3x a week OT Frequency: 3x a week            Contractures Contractures Info: Not present    Additional Factors Info  Code Status, Allergies Code Status Info: Full Allergies Info: Finasteride   Levothyroxine   Nsaids   Amoxicillin   Ampicillin   Penicillins   Strawberry Extract   Asa (Aspirin)   Bactrim (Sulfamethoxazole-trimethoprim)   Dapagliflozin   Depakote (Divalproex Sodium)   Dilantin (Phenytoin)   Methocarbamol   Other   Risperidone And Related   Sitagliptin   Strawberry (Diagnostic)   Sulfa Antibiotics   Tolmetin   Ultram (Tramadol Hcl)   Buprenorphine   Oatmeal           Current Medications (06/30/2022):  This is the current hospital active medication list Current Facility-Administered Medications  Medication Dose Route Frequency Provider Last Rate Last Admin   acetaminophen (TYLENOL) tablet 650 mg  650 mg Oral Q6H PRN 07/02/2022, MD   650 mg at 06/28/22 1238   Or   acetaminophen (TYLENOL) suppository 650 mg  650 mg Rectal Q6H PRN 06/30/22, MD  albuterol (PROVENTIL) (2.5 MG/3ML) 0.083% nebulizer solution 2.5 mg  2.5 mg Inhalation Q6H PRN [name] Giovanni, MD       atorvastatin (LIPITOR) tablet 80 mg  80 mg Oral QHS [name] Giovanni, MD   80 mg at 06/29/22 2220   clopidogrel (PLAVIX) tablet 75 mg  75 mg Oral Daily Rodolph Bong, MD   75 mg at 06/30/22 1023   enoxaparin (LOVENOX) injection 40 mg  40 mg Subcutaneous Q24H [name] Giovanni, MD   40 mg at 06/29/22 2220   fenofibrate tablet 54 mg  54 mg Oral Daily Rodolph Bong, MD   54 mg at 06/30/22  1024   gabapentin (NEURONTIN) capsule 600 mg  600 mg Oral TID [name] Giovanni, MD   600 mg at 06/30/22 1023   insulin aspart (novoLOG) injection 0-15 Units  0-15 Units Subcutaneous TID WC [name] Giovanni, MD   5 Units at 06/30/22 0842   insulin aspart (novoLOG) injection 0-5 Units  0-5 Units Subcutaneous QHS [name] Giovanni, MD   5 Units at 06/29/22 2222   insulin glargine-yfgn (SEMGLEE) injection 25 Units  25 Units Subcutaneous QHS [name] Giovanni, MD   25 Units at 06/29/22 2221   lactulose (CHRONULAC) 10 GM/15ML solution 30 g  30 g Oral Daily PRN Rodolph Bong, MD   30 g at 06/29/22 1000   latanoprost (XALATAN) 0.005 % ophthalmic solution 1 drop  1 drop Both Eyes QHS [name] Giovanni, MD   1 drop at 06/29/22 2220   lurasidone (LATUDA) tablet 20 mg  20 mg Oral QHS [name] Giovanni, MD   20 mg at 06/29/22 2221   mometasone-formoterol (DULERA) 100-5 MCG/ACT inhaler 2 puff  2 puff Inhalation BID [name] Giovanni, MD   2 puff at 06/29/22 1022   nortriptyline (PAMELOR) capsule 100 mg  100 mg Oral QHS [name] Giovanni, MD   100 mg at 06/29/22 2222   oxyCODONE-acetaminophen (PERCOCET/ROXICET) 5-325 MG per tablet 1 tablet  1 tablet Oral Q8H PRN Rodolph Bong, MD   1 tablet at 06/29/22 2225   And   oxyCODONE (Oxy IR/ROXICODONE) immediate release tablet 5 mg  5 mg Oral Q8H PRN Rodolph Bong, MD   5 mg at 06/29/22 2225   SUMAtriptan (IMITREX) tablet 100 mg  100 mg Oral Daily PRN Rodolph Bong, MD       tamsulosin [facility]) capsule 0.4 mg  0.4 mg Oral Daily PRN Rodolph Bong, MD       timolol (TIMOPTIC) 0.5 % ophthalmic solution 1 drop  1 drop Both Eyes Daily [name] Giovanni, MD   1 drop at 06/30/22 1024   traZODone (DESYREL) tablet 100 mg  100 mg Oral QHS [name] Giovanni, MD   100 mg at 06/29/22 2220   verapamil (CALAN-SR) CR tablet 120 mg  120 mg Oral Daily Rodolph Bong, MD   120 mg at 06/30/22 1023   zolpidem (AMBIEN) tablet 5 mg  5 mg Oral QHS PRN,MR  X 1 Rodolph Bong, MD       zonisamide (ZONEGRAN) capsule 200 mg  200 mg Oral BID [name] Giovanni, MD   200 mg at 06/30/22 1022     Discharge Medications: Please see discharge summary for a list of discharge medications.  Relevant Imaging Results:  Relevant Lab Results:   Additional Information SS#: [id number]  Ivette Loyal, LCSWA

## 2022-07-01 DIAGNOSIS — F319 Bipolar disorder, unspecified: Secondary | ICD-10-CM

## 2022-07-01 DIAGNOSIS — E785 Hyperlipidemia, unspecified: Secondary | ICD-10-CM

## 2022-07-01 DIAGNOSIS — R29898 Other symptoms and signs involving the musculoskeletal system: Secondary | ICD-10-CM | POA: Diagnosis not present

## 2022-07-01 LAB — BASIC METABOLIC PANEL
Anion gap: 8 (ref 5–15)
BUN: 14 mg/dL (ref 6–20)
CO2: 23 mmol/L (ref 22–32)
Calcium: 9.5 mg/dL (ref 8.9–10.3)
Chloride: 106 mmol/L (ref 98–111)
Creatinine, Ser: 1.65 mg/dL — ABNORMAL HIGH (ref 0.61–1.24)
GFR, Estimated: 47 mL/min — ABNORMAL LOW (ref >60)
Glucose, Bld: 351 mg/dL — ABNORMAL HIGH (ref 70–99)
Potassium: 4.3 mmol/L (ref 3.5–5.1)
Sodium: 137 mmol/L (ref 135–145)

## 2022-07-01 LAB — GLUCOSE, CAPILLARY
Glucose-Capillary: 316 mg/dL — ABNORMAL HIGH (ref 70–99)
Glucose-Capillary: 272 mg/dL — ABNORMAL HIGH (ref 70–99)
Glucose-Capillary: 175 mg/dL — ABNORMAL HIGH (ref 70–99)
Glucose-Capillary: 243 mg/dL — ABNORMAL HIGH (ref 70–99)

## 2022-07-01 MED ORDER — DICLOFENAC SODIUM 1 % EX GEL: 2.0000 g | Freq: Four times a day (QID) | CUTANEOUS | 3 refills | Status: DC

## 2022-07-01 MED ORDER — ESZOPICLONE 3 MG PO TABS: 3.0000 mg | ORAL_TABLET | Freq: Every day | ORAL | 0 refills | Status: AC

## 2022-07-01 MED ORDER — ACETAMINOPHEN 325 MG PO TABS: 650.0000 mg | ORAL_TABLET | Freq: Four times a day (QID) | ORAL | Status: DC | PRN

## 2022-07-01 MED ORDER — TOUJEO MAX SOLOSTAR 300 UNIT/ML ~~LOC~~ SOPN: 33.0000 [IU] | PEN_INJECTOR | Freq: Every day | SUBCUTANEOUS | Status: AC

## 2022-07-01 MED ORDER — OXYCODONE-ACETAMINOPHEN 5-325 MG PO TABS: 1.0000 | ORAL_TABLET | Freq: Three times a day (TID) | ORAL | 0 refills | Status: DC | PRN

## 2022-07-01 NOTE — Progress Notes (Signed)
Mobility Specialist Progress Note:   07/01/22 0945  Mobility  Activity Ambulated with assistance in hallway  Level of Assistance Contact guard assist, steadying assist  Assistive Device Front wheel walker  Distance Ambulated (ft) 450 ft  Activity Response Tolerated well  $Mobility charge 1 Mobility   Pt agreeable to mobility session after pre-medication. Required CGA throughout for safety. No knee buckling noted. Pt back in chair with all needs met, chair alarm on.   Nelta Numbers Acute Rehab Secure Chat or Office Phone: [phone]

## 2022-07-01 NOTE — Hospital Course (Signed)
Nicholas [name] is a 61 y.o. male with medical history significant of asthma, bipolar disorder, schizophrenia, chronic pain, seizures/pseudoseizures, conversion disorder, cervical myelopathy, GERD, hypertension, hyperlipidemia, hyperthyroidism, hemiplegic migraine, TIA, insulin-dependent type 2 diabetes, CKD stage IIIa, BPH presented to the ED with altered speech, right arm weakness, right face decreased sensation, left leg weakness and numbness.  Vital signs stable.  Labs significant for WBC 11.2, bicarb 17, anion gap 11, BUN 23, creatinine 1.9 (baseline 1.3), glucose 225.  Blood ethanol level undetectable.  Lactic acid 3.2.  UDS negative.  UA without signs of infection.  Brain MRI negative for stroke. Patient was given Tylenol, Benadryl, Compazine, 500 cc LR bolus, Keppra, 1 L normal saline bolus. Neurology will consult and recommended MRI of lumbar spine.   Patient states his symptoms started at 4 AM this morning.  He is endorsing left lower extremity weakness and numbness, reports pins and needle sensation at the bottom of his foot.  Denies saddle anesthesia or bowel/bladder dysfunction.  Endorsing chronic low back pain.  Endorsing right upper extremity weakness, numbness, and tingling since this morning.  Also endorsing right-sided facial numbness and right-sided headaches since this morning.  In addition, endorsing slurred speech and confusion.  States he checked his blood sugar at home and the meter read "high."  States his PCP gave him a steroid injection yesterday for headaches and advised him to double up on the dose of his nighttime insulin.

## 2022-07-01 NOTE — Assessment & Plan Note (Signed)
AKI ruled out ?

## 2022-07-01 NOTE — Discharge Summary (Signed)
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 Rolene Arbour (see below) for follow up suspected peripheral neuropathy     Discharge Diagnoses: Principal Problem:   Left leg weakness suspected due to conversion disorder in setting of possible peripheral neuropathy and osteoarthritis Active Problems:   Seizure disorder (HCC)   Type 2 diabetes mellitus with diabetic neuropathy, with long-term current use of insulin and hyperglycemia   Stage 3a chronic kidney disease (CKD) (HCC)   Essential hypertension   Metabolic acidosis   Bipolar 1 disorder (HCC)   Hyperlipidemia      Hospital Course: Nicholas [name] is a 61 y.o. M with chronic pain syndrome on daily oxycodone, mood disorder, hx conversion disorder, hx seizures, hx pseudoseizures, hx hemiplegic migrain, HTN, DM, CKD IIIa, and BPH who presented with altered speech, right arm weakness, right face decreased sensation, left leg weakness and numbness.   Found to have mild dehydration, elevated lactic acid, hyperglycemia.  Given fluids in the ER and Neurology were consulted.      Left leg weakness suspected due to conversion disorder in setting of possible peripheral neuropathy and osteoarthritis Neurology were consulted. Neurology felt that he had a combination of organic symptoms of likely peripheral neuropathy as well as inorganic symptoms, which confounded examination.     MRI brain and lumbar spine negative.   Neurology recommended PT and outpatient Neurology follow up for consideration of NCS/EMG.   Dehydration Given IV fluids and resolved.  Knee pain due to  osteoarthritis and CPPD Patient complained of knee pain.  X-ray obtained that showed mild OA and some CPPD.  Certainly could be some mild meniscal disease as well, given the above, and his "give way" symptoms.  Voltaren gel and PT recommended and follow up with PCP.  Stage 3a chronic kidney disease (CKD) (HCC) AKI ruled out            The [facility] Controlled Substances Registry was not possible to review for this patient prior to discharge.   Consultants: Neurology Procedures performed:  - MRI brain   - MR lubar spine   Disposition: SNF   DISCHARGE MEDICATION: Allergies as of 07/01/2022       Reactions   Finasteride Other (See Comments)   Break out   Levothyroxine Anaphylaxis, Cough   Chronic cough   Nsaids Other (See Comments)   D/t gastric ulcer   Amoxicillin Itching, Other (See Comments)   THRUSH Causes sores in mouth   Ampicillin Other (See Comments)   THRUSH   Penicillins Itching, Other (See Comments)   THRUSH- Causes sores in mouth   Strawberry Extract Swelling   LIPS SWELL   Asa [aspirin] Other (See Comments)   Bleeding   Bactrim [sulfamethoxazole-trimethoprim] Hives, Itching, Other (See Comments)   GI upset   Dapagliflozin    Other reaction(s): Other (See Comments) Allergic to steroids and had mouth broke out.    Depakote [divalproex Sodium]    Causes double vision and speech problems    Dilantin [phenytoin] Other (See Comments)   Severe skin peeling   Methocarbamol Other (See Comments)   dizziness  Other    Med for prostate that startes with a M- Broke out the insid eof mouth, split the tongue, and caused throudh   Risperidone And Related Other (See Comments)   Hallucinations   Sitagliptin Other (See Comments)   pancreatitis   Strawberry (diagnostic) Itching, Swelling   Sulfa Antibiotics Other (See Comments)   Affects thyroid   Tolmetin Other (See Comments)   D/t gastric ulcer   Ultram [tramadol Hcl] Other (See Comments)   D/t  gastric ulcer   Buprenorphine Itching, Rash, Other (See Comments)   "Patches broke me out in red patches-  caused a fever, also"   Oatmeal Hives, Other (See Comments)   White spots/sores in mouth        Medication List     STOP taking these medications    dicyclomine 20 MG tablet Commonly known as: BENTYL   doxycycline 100 MG tablet Commonly known as: VIBRA-TABS   oxyCODONE-acetaminophen 10-325 MG tablet Commonly known as: PERCOCET Replaced by: oxyCODONE-acetaminophen 5-325 MG tablet   predniSONE 20 MG tablet Commonly known as: DELTASONE       TAKE these medications    acetaminophen 325 MG tablet Commonly known as: TYLENOL Take 2 tablets (650 mg total) by mouth every 6 (six) hours as needed for mild pain (or Fever >/= 101).   albuterol 108 (90 Base) MCG/ACT inhaler Commonly known as: VENTOLIN HFA Inhale 1-2 puffs into the lungs every 6 (six) hours as needed for wheezing or shortness of breath.   atorvastatin 80 MG tablet Commonly known as: LIPITOR Take 1 tablet (80 mg total) by mouth at bedtime.   budesonide-formoterol 80-4.5 MCG/ACT inhaler Commonly known as: SYMBICORT Inhale 2 puffs into the lungs daily.   clopidogrel 75 MG tablet Commonly known as: PLAVIX Take 1 tablet (75 mg total) by mouth daily.   diclofenac Sodium 1 % Gel Commonly known as: Voltaren Arthritis Pain Apply 2 g topically 4 (four) times daily.   Eszopiclone 3 MG Tabs Take 1 tablet (3 mg total) by mouth at bedtime.   fenofibrate 145 MG tablet Commonly known as: TRICOR Take 1 tablet (145 mg total) by mouth daily.   gabapentin 600 MG tablet Commonly known as: NEURONTIN Take 1 tablet (600 mg total) by mouth daily at 6 (six) AM. What changed: when to take this   HumaLOG KwikPen 100 UNIT/ML KwikPen Generic drug: insulin lispro Inject 0-16 Units into the skin 4 (four) times daily as needed (high blood sugar). Inject into the skin up to 4 times a day, per sliding scale: BGL 160 or  greater = 10 units; 200 or greater = 14 units   lactulose 10 GM/15ML solution Commonly known as: CHRONULAC Take 30 g by mouth daily as needed for moderate constipation.   latanoprost 0.005 % ophthalmic solution Commonly known as: XALATAN Place 1 drop into both eyes at bedtime.   lurasidone 20 MG Tabs tablet Commonly known as: LATUDA Take 1 tablet by mouth at bedtime.   metFORMIN 1000 MG tablet Commonly known as: GLUCOPHAGE Take 1,000 mg by mouth 2 (two) times daily.   nortriptyline 50 MG capsule Commonly known as: PAMELOR Take 100 mg by mouth at bedtime.   oxyCODONE-acetaminophen 5-325 MG tablet Commonly known as: PERCOCET/ROXICET Take 1 tablet by mouth every 8 (eight) hours as needed for moderate pain. Replaces: oxyCODONE-acetaminophen 10-325 MG tablet   SUMAtriptan 100 MG tablet Commonly known as: IMITREX Take 1 tablet (100 mg total) by mouth daily as needed for migraine or headache. May  repeat in 2 hours if headache persists or recurs.   tamsulosin 0.4 MG Caps capsule Commonly known as: FLOMAX Take 0.4 mg by mouth daily as needed (prostate).   timolol 0.5 % ophthalmic solution Commonly known as: TIMOPTIC Place 1 drop into both eyes daily.   Toujeo Max SoloStar 300 UNIT/ML Solostar Pen Generic drug: insulin glargine (2 Unit Dial) Inject 33 Units into the skin at bedtime. What changed: how much to take   traZODone 100 MG tablet Commonly known as: DESYREL Take 100 mg by mouth at bedtime.   verapamil 120 MG CR tablet Commonly known as: CALAN-SR Take 120 mg by mouth daily.   zonisamide 100 MG capsule Commonly known as: ZONEGRAN Take 200 mg by mouth 2 (two) times daily.        Follow-up Information     Loura BackNguyen, Kim, NP Follow up.   Specialty: Nurse Practitioner Why: Follow up 1 week after discharge from rehab Contact information: [address] [place] [id number] [phone]         Rolene ArbourBoggs, Jane, MD Follow up.   Specialty: Neurology Why: Call  for an appointment in 2-4 weeks Contact information: [address] [place] [address] [phone]                 Discharge Instructions     Discharge instructions   Complete by: As directed    For the knee pain: Use Voltaren gel up to three times daily Continue working with PT to strengthen the muscles of your leg Go see your primary care doctor 1 week after discharge  For the numbness in your leg: Go [facility]rologist[facility] We have asked [facility] to help set this up   Increase activity slowly   Complete by: As directed        Discharge Exam: Filed Weights   06/27/22 0847  Weight: 89.4 kg    General: Pt is alert, awake, not in acute distress Cardiovascular: RRR, nl S1-S2, no murmurs appreciated.   No LE edema.   Respiratory: Normal respiratory rate and rhythm.  CTAB without rales or wheezes. Abdominal: Abdomen soft and non-tender.  No distension or HSM.   Neuro/Psych: Strength symmetric in upper and lower extremities.  Judgment and insight appear normal.   Condition at discharge: good  The results of significant diagnostics from this hospitalization (including imaging, microbiology, ancillary and laboratory) are listed below for reference.   Imaging Studies: DG Knee Complete 4 Views Left  Result Date: 06/29/2022 CLINICAL DATA:  Left knee pain after remote trauma 25 years ago EXAM: LEFT KNEE - COMPLETE 4+ VIEW COMPARISON:  None Available. FINDINGS: Mild medial compartment joint space narrowing and subchondral sclerosis. Chondrocalcinosis within the lateral meniscus. No acute fracture or dislocation. No joint effusion. IMPRESSION: 1. Mild medial compartment osteoarthritis. 2. Lateral meniscal chondrocalcinosis, consistent with calcium pyrophosphate deposition disease. Electronically Signed   By: Jeronimo GreavesKyle  Talbot M.D.   On: 06/29/2022 17:02   MR LUMBAR SPINE WO CONTRAST  Result Date: 06/27/2022 CLINICAL DATA:  Low  back pain EXAM: MRI LUMBAR SPINE WITHOUT CONTRAST TECHNIQUE: Multiplanar, multisequence MR imaging of the lumbar spine was performed. No intravenous contrast was administered. COMPARISON:  None Available. FINDINGS: Segmentation:  Standard. Alignment:  Physiologic. Vertebrae:  No fracture, evidence of discitis, or bone lesion. Conus medullaris and cauda equina: Conus extends to the L2 level. Conus and cauda equina appear normal. Paraspinal and other soft tissues: Negative Disc levels: L1-L2: Normal  disc space and facet joints. No spinal canal stenosis. No neural foraminal stenosis. L2-L3: Normal disc space and facet joints. No spinal canal stenosis. No neural foraminal stenosis. L3-L4: Normal disc space and facet joints. No spinal canal stenosis. No neural foraminal stenosis. L4-L5: Unchanged small disc bulge. No spinal canal stenosis. Mild right neural foraminal stenosis. L5-S1: Small disc bulge. No spinal canal stenosis. No neural foraminal stenosis. Visualized sacrum: Normal. IMPRESSION: 1. Unchanged mild lower lumbar degenerative disc disease without spinal canal stenosis. 2. Unchanged mild right L4-5 neural foraminal stenosis. Electronically Signed   By: Deatra Robinson M.D.   On: 06/27/2022 22:35   MR BRAIN WO CONTRAST  Result Date: 06/27/2022 CLINICAL DATA:  Aphasia EXAM: MRI HEAD WITHOUT CONTRAST TECHNIQUE: Multiplanar, multiecho pulse sequences of the brain and surrounding structures were obtained without intravenous contrast. COMPARISON:  Head CT from earlier the same day and brain MRI 03/13/2021 FINDINGS: Brain: No acute infarction, hemorrhage, hydrocephalus, extra-axial collection or mass lesion. No white matter disease or atrophy Vascular: Major flow voids are preserved Skull and upper cervical spine: Normal marrow signal Sinuses/Orbits: Negative. Other: Motion artifact, reportedly due to uncontrolled snoring. IMPRESSION: Negative motion degraded brain MRI. Electronically Signed   By: Tiburcio Pea  M.D.   On: 06/27/2022 12:29   CT HEAD WO CONTRAST  Result Date: 06/27/2022 CLINICAL DATA:  Weakness.  Possible stroke. EXAM: CT HEAD WITHOUT CONTRAST TECHNIQUE: Contiguous axial images were obtained from the base of the skull through the vertex without intravenous contrast. RADIATION DOSE REDUCTION: This exam was performed according to the departmental dose-optimization program which includes automated exposure control, adjustment of the mA and/or kV according to patient size and/or use of iterative reconstruction technique. COMPARISON:  06/25/2021 FINDINGS: Brain: No evidence of acute infarction, hemorrhage, hydrocephalus, extra-axial collection or mass lesion/mass effect. Vascular: No hyperdense vessel or unexpected calcification. Skull: Normal. Negative for fracture or focal lesion. Sinuses/Orbits: No acute finding. Other: None. IMPRESSION: No acute findings. Electronically Signed   By: Elberta Fortis M.D.   On: 06/27/2022 09:08    Microbiology: Results for orders placed or performed during the hospital encounter of 06/27/22  Resp Panel by RT-PCR (Flu A&B, Covid) Anterior Nasal Swab     Status: None   Collection Time: 06/27/22  8:28 AM   Specimen: Anterior Nasal Swab  Result Value Ref Range Status   SARS Coronavirus 2 by RT PCR NEGATIVE NEGATIVE Final    Comment: (NOTE) SARS-CoV-2 target nucleic acids are NOT DETECTED.  The SARS-CoV-2 RNA is generally detectable in upper respiratory specimens during the acute phase of infection. The lowest concentration of SARS-CoV-2 viral copies this assay can detect is 138 copies/mL. A negative result does not preclude SARS-Cov-2 infection and should not be used as the sole basis for treatment or other patient management decisions. A negative result may occur with  improper specimen collection/handling, submission of specimen other than nasopharyngeal swab, presence of viral mutation(s) within the areas targeted by this assay, and inadequate number of  viral copies(<138 copies/mL). A negative result must be combined with clinical observations, patient history, and epidemiological information. The expected result is Negative.  Fact Sheet for Patients:  BloggerCourse.com  Fact Sheet for Healthcare Providers:  SeriousBroker.it  This test is no t yet approved or cleared by the [place] FDA and  has been authorized for detection and/or diagnosis of SARS-CoV-2 by FDA under an Emergency Use Authorization (EUA). This EUA will remain  in effect (meaning this test can be used) for the duration  of the COVID-19 declaration under Section 564(b)(1) of the Act, 21 U.S.C.section 360bbb-3(b)(1), unless the authorization is terminated  or revoked sooner.       Influenza A by PCR NEGATIVE NEGATIVE Final   Influenza B by PCR NEGATIVE NEGATIVE Final    Comment: (NOTE) The Xpert Xpress SARS-CoV-2/FLU/RSV plus assay is intended as an aid in the diagnosis of influenza from Nasopharyngeal swab specimens and should not be used as a sole basis for treatment. Nasal washings and aspirates are unacceptable for Xpert Xpress SARS-CoV-2/FLU/RSV testing.  Fact Sheet for Patients: BloggerCourse.com  Fact Sheet for Healthcare Providers: SeriousBroker.it  This test is not yet approved or cleared by the [place] FDA and has been authorized for detection and/or diagnosis of SARS-CoV-2 by FDA under an Emergency Use Authorization (EUA). This EUA will remain in effect (meaning this test can be used) for the duration of the COVID-19 declaration under Section 564(b)(1) of the Act, 21 U.S.C. section 360bbb-3(b)(1), unless the authorization is terminated or revoked.  Performed at [facility], [address] [place], [place] [address]     Labs: CBC: Recent Labs  Lab 06/27/22 [phone] 06/27/22 0836 06/29/22 0138  WBC 11.2*  --  6.3  NEUTROABS  9.7*  --   --   HGB 13.4 14.6 11.6*  HCT 41.5 43.0 35.9*  MCV 85.6  --  85.7  PLT 218  --  178   Basic Metabolic Panel: Recent Labs  Lab 06/27/22 0828 06/27/22 0836 06/27/22 2317 06/29/22 0138 07/01/22 0605  NA 136 140 141 140 137  K 4.8 4.7 3.8 3.8 4.3  CL 108 108 113* 112* 106  CO2 17*  --  21* 21* 23  GLUCOSE 225* 219* 131* 187* 351*  BUN 23* 25* 20 18 14   CREATININE 1.90* 1.80* 1.41* 1.31* 1.65*  CALCIUM 9.6  --  9.3 9.2 9.5  MG  --   --  1.7  --   --   PHOS  --   --  3.0  --   --    Liver Function Tests: Recent Labs  Lab 06/27/22 0828  AST 28  ALT 33  ALKPHOS 67  BILITOT 0.3  PROT 7.0  ALBUMIN 4.2   CBG: Recent Labs  Lab 06/30/22 1247 06/30/22 1535 06/30/22 2030 07/01/22 0556 07/01/22 0756  GLUCAP 236* 286* 266* 316* 272*    Discharge time spent: approximately 35 minutes spent on discharge counseling, evaluation of patient on day of discharge, and coordination of discharge planning with nursing, social work, pharmacy and case management  Signed: 07/03/22, MD [facility] 07/01/2022

## 2022-07-01 NOTE — Progress Notes (Addendum)
Attempt made to call [name] at the number listes in chart no answer . Relative, Barbara Cower  here to pick up pt. Discharge instructions already given to patient by previous nurse and IA removed. Pt left unit in wheelchair with personal belongings  accompanied by NT in stable condition

## 2022-07-01 NOTE — Inpatient Diabetes Management (Signed)
Inpatient Diabetes Program Recommendations  AACE/ADA: New Consensus Statement on Inpatient Glycemic Control   Target Ranges:  Prepandial:   less than 140 mg/dL      Peak postprandial:   less than 180 mg/dL (1-2 hours)      Critically ill patients:  140 - 180 mg/dL    Latest Reference Range & Units 06/30/22 07:54 06/30/22 12:47 06/30/22 15:35 06/30/22 20:30 07/01/22 05:56  Glucose-Capillary 70 - 99 mg/dL 023 (H) 343 (H) 568 (H) 266 (H) 316 (H)   Review of Glycemic Control  Diabetes history: DM2 Outpatient Diabetes medications: Toujeo 25 units QHS, Humalog 0-16 units TID with meals, Metformin 1000 mg BID Current orders for Inpatient glycemic control: Semglee 30 units QHS, Novolog 0-15 units TID with meals, Novolog 0-5 units QHS   Inpatient Diabetes Program Recommendations:     Insulin: Please consider increasing Semglee to 33 units QHS and ordering Novolog 5 units TID with meals for meal coverage if patient eats at least 50% of meals.   Thanks, Orlando Penner, RN, MSN, CDCES Diabetes Coordinator Inpatient Diabetes Program [phone] (Team Pager from 8am to 5pm)

## 2022-07-01 NOTE — TOC Transition Note (Addendum)
Transition of Care [facility]) - CM/SW Discharge Note   Patient Details  Name: ORVELL CAREAGA MRN: [id number] Date of Birth: Aug 01, 1961  [facility] CM/SW Contact:  Lynett Grimes Phone Number: 07/01/2022, 10:05 AM   Clinical Narrative:    Patient will DC to: [name] Anticipated DC date: 07/01/2022 Family notified: Pt  Transport by: Sharin Mons   Per MD patient ready for DC to [facility] room [address] RN to call report prior to discharge [phone]). RN, patient, patient's family, and facility notified of DC. Discharge Summary and FL2 sent to facility. DC packet on chart. Ambulance transport requested for patient. CSW suggested a wheelchair van, pt declined and requested non emergency ems.  CSW will sign off for now as social work intervention is no longer needed. Please consult [place] again if new needs arise.     Final next level of care: Skilled Nursing Facility Barriers to Discharge: Continued Medical Work up   Patient Goals and CMS Choice Patient states their goals for this hospitalization and ongoing recovery are:: Rehab CMS Medicare.gov Compare Post Acute Care list provided to:: Patient Choice offered to / list presented to : Patient  Discharge Placement                       Discharge Plan and Services In-house Referral: Clinical Social Work Discharge Planning Services: CM Consult Post Acute Care Choice: Skilled Nursing Facility                               Social Determinants of Health (SDOH) Interventions     Readmission Risk Interventions     No data to display

## 2022-07-01 NOTE — Progress Notes (Signed)
Occupational Therapy Treatment Patient Details Name: DJIBRIL GLOGOWSKI MRN: [id number] DOB: 06-09-1961 Today's Date: 07/01/2022   History of present illness Pt is a 61 y.o. male who presented 06/27/22 with altered speech, R arm weakness,  right face decreased sensation, left leg weakness and numbness. MRI of lumbar spine-unchanged mild lower lumbar degenerative disc disease without spinal canal stenosis and unchanged mild right L4-5 neural foraminal stenosis. MRI of brain was negative. PMH: asthma, bipolar disorder, schizophrenia, chronic pain, seizures/pseudoseizures, conversion disorder, cervical myelopathy, GERD, hypertension, hyperlipidemia, hyperthyroidism, hemiplegic migraine, TIA, insulin-dependent type 2 diabetes, CKD stage IIIa, BPH   OT comments  Patient received in supine and asking for shower and dressing to prepare for discharge today. Patient ambulated to bathroom and transferred to shower seat. Patient performed bathing with assistance for back and LE while seated and assistance for balance while standing. Patient performed dressing with setup for UB and assistance with fasteners and donning shoes for LB dressing. Patient is making great progress and is expected to discharge today to SNF for continued rehab.    Recommendations for follow up therapy are one component of a multi-disciplinary discharge planning process, led by the attending physician.  Recommendations may be updated based on patient status, additional functional criteria and insurance authorization.    Follow Up Recommendations  Skilled nursing-short term rehab (<3 hours/day)    Assistance Recommended at Discharge Frequent or constant Supervision/Assistance  Patient can return home with the following  A little help with walking and/or transfers;A lot of help with bathing/dressing/bathroom;Assistance with cooking/housework;Assist for transportation   Equipment Recommendations  Other (comment) (TBD)    Recommendations  for Other Services      Precautions / Restrictions Precautions Precautions: Fall Precaution Comments: L UE recent surgery with cast       Mobility Bed Mobility Overal bed mobility: Needs Assistance Bed Mobility: Supine to Sit     Supine to sit: Min guard     General bed mobility comments: assist for L LE    Transfers Overall transfer level: Needs assistance Equipment used: Rolling walker (2 wheels) Transfers: Sit to/from Stand Sit to Stand: Min assist           General transfer comment: min assist for safety due to LLE knee pain     Balance Overall balance assessment: Needs assistance Sitting-balance support: Feet supported Sitting balance-Leahy Scale: Good     Standing balance support: No upper extremity supported, During functional activity Standing balance-Leahy Scale: Fair Standing balance comment: able to stand during shower and at sink for grooming with no UE support                           ADL either performed or assessed with clinical judgement   ADL Overall ADL's : Needs assistance/impaired     Grooming: Oral care;Wash/dry hands;Wash/dry face;Min guard;Standing Grooming Details (indicate cue type and reason): at sink Upper Body Bathing: Set up;Sitting Upper Body Bathing Details (indicate cue type and reason): in shower Lower Body Bathing: Minimal assistance;Sit to/from stand Lower Body Bathing Details (indicate cue type and reason): stood for peri area cleaning in shower Upper Body Dressing : Set up;Sitting Upper Body Dressing Details (indicate cue type and reason): donned T-shirt Lower Body Dressing: Min guard;Sitting/lateral leans;Moderate assistance;Sit to/from stand Lower Body Dressing Details (indicate cue type and reason): required assistance with fasteners and shoes         Tub/ Shower Transfer: Min guard;Ambulation;Shower Firefighter Details (indicate  cue type and reason): ambulated to bathroom  and to shower seat   General ADL Comments: limited due to LUE and LLE knee pain    Extremity/Trunk Assessment Upper Extremity Assessment LUE Deficits / Details: Casted from a surgery recently. LUE Sensation: WNL LUE Coordination: decreased fine motor            Vision       Perception     Praxis      Cognition Arousal/Alertness: Awake/alert Behavior During Therapy: Flat affect Overall Cognitive Status: Within Functional Limits for tasks assessed                                 General Comments: followed directions well        Exercises      Shoulder Instructions       General Comments      Pertinent Vitals/ Pain       Pain Assessment Pain Assessment: Faces Faces Pain Scale: Hurts little more Pain Location: LUE and LLE Pain Descriptors / Indicators: Burning, Tightness, Tingling Pain Intervention(s): Monitored during session, Repositioned, Patient requesting pain meds-RN notified  Home Living                                          Prior Functioning/Environment              Frequency  Min 2X/week        Progress Toward Goals  OT Goals(current goals can now be found in the care plan section)  Progress towards OT goals: Progressing toward goals  Acute Rehab OT Goals Patient Stated Goal: get more rehab OT Goal Formulation: With patient Time For Goal Achievement: 07/12/22 Potential to Achieve Goals: Good ADL Goals Pt Will Perform Grooming: with modified independence;standing Pt Will Perform Lower Body Bathing: with modified independence;sitting/lateral leans;sit to/from stand Pt Will Perform Lower Body Dressing: with modified independence;sitting/lateral leans;sit to/from stand Pt Will Transfer to Toilet: with modified independence;ambulating Pt Will Perform Toileting - Clothing Manipulation and hygiene: with modified independence;sitting/lateral leans;sit to/from stand  Plan Discharge plan remains appropriate     Co-evaluation                 AM-PAC OT "6 Clicks" Daily Activity     Outcome Measure   Help from another person eating meals?: A Little Help from another person taking care of personal grooming?: A Little Help from another person toileting, which includes using toliet, bedpan, or urinal?: A Lot Help from another person bathing (including washing, rinsing, drying)?: A Lot Help from another person to put on and taking off regular upper body clothing?: A Little Help from another person to put on and taking off regular lower body clothing?: A Lot 6 Click Score: 15    End of Session Equipment Utilized During Treatment: Rolling walker (2 wheels)  OT Visit Diagnosis: Unsteadiness on feet (R26.81);Other abnormalities of gait and mobility (R26.89);Muscle weakness (generalized) (M62.81);Other symptoms and signs involving the nervous system (R29.898)   Activity Tolerance Patient tolerated treatment well   Patient Left in chair;with call bell/phone within reach;with chair alarm set   Nurse Communication Mobility status;Patient requests pain meds        Time: 0752-0831 OT Time Calculation (min): 39 min  Charges: OT General Charges $OT Visit: 1 Visit OT Treatments $Self Care/Home Management :  38-52 mins  Alfonse Flavors, OTA Acute Rehabilitation Services  Office [phone]   Dewain Penning 07/01/2022, 9:42 AM

## 2022-07-02 ENCOUNTER — Inpatient Hospital Stay: Admission: RE | Admit: 2022-07-02 | Payer: Medicaid Other | Source: Ambulatory Visit

## 2022-10-26 ENCOUNTER — Emergency Department (HOSPITAL_COMMUNITY)
Admission: EM | Admit: 2022-10-26 | Discharge: 2022-10-26 | Disposition: A | Discharge status: Home / Self Care | Payer: Medicare HMO | Attending: Emergency Medicine | Admitting: Emergency Medicine

## 2022-10-26 ENCOUNTER — Encounter (HOSPITAL_COMMUNITY): Payer: Self-pay | Admitting: Emergency Medicine

## 2022-10-26 ENCOUNTER — Other Ambulatory Visit: Payer: Self-pay

## 2022-10-26 DIAGNOSIS — M62838 Other muscle spasm: Secondary | ICD-10-CM

## 2022-10-26 DIAGNOSIS — I129 Hypertensive chronic kidney disease with stage 1 through stage 4 chronic kidney disease, or unspecified chronic kidney disease: Secondary | ICD-10-CM | POA: Diagnosis not present

## 2022-10-26 DIAGNOSIS — E1122 Type 2 diabetes mellitus with diabetic chronic kidney disease: Secondary | ICD-10-CM | POA: Diagnosis not present

## 2022-10-26 DIAGNOSIS — Z7984 Long term (current) use of oral hypoglycemic drugs: Secondary | ICD-10-CM | POA: Insufficient documentation

## 2022-10-26 DIAGNOSIS — Z794 Long term (current) use of insulin: Secondary | ICD-10-CM | POA: Insufficient documentation

## 2022-10-26 DIAGNOSIS — M436 Torticollis: Secondary | ICD-10-CM | POA: Insufficient documentation

## 2022-10-26 DIAGNOSIS — N1831 Chronic kidney disease, stage 3a: Secondary | ICD-10-CM | POA: Insufficient documentation

## 2022-10-26 MED ORDER — METHOCARBAMOL 750 MG PO TABS: 750.0000 mg | ORAL_TABLET | Freq: Three times a day (TID) | ORAL | 0 refills | Status: DC | PRN

## 2022-10-26 MED ORDER — METHOCARBAMOL 500 MG PO TABS
1000.0000 mg | ORAL_TABLET | Freq: Once | ORAL | Status: AC
  Administered 2022-10-26: 1000 mg via ORAL
  Filled 2022-10-26: qty 2

## 2022-10-26 NOTE — Discharge Instructions (Addendum)
You came in for neck pain and tightness which is likely due to muscle spasm.  We gave you a muscle relaxer, and I have sent some to take at home as needed.  Continue your pain medications as you need as well and can alternate between ice and heat.  Follow-up with PCP as needed and I recommend physical therapy to help increase strength and range of motion.

## 2022-10-26 NOTE — ED Triage Notes (Signed)
Pt reported to ED with c/o bilateral neck pain and stiffness, stating he has a "crook in his neck and he cannot turn it". Pt states symptoms have been ongoing x3 days and worsened.  Has hx of neck surgery and cervical disc fusion.

## 2022-10-26 NOTE — ED Notes (Signed)
Patient verbalizes understanding of discharge instructions. Opportunity for questioning and answers were provided. Recommend pt not drive since given robaxin in ED; pt verbalized understanding, states he will arrange for a ride home;  Pt discharged from ED.

## 2022-10-26 NOTE — ED Provider Notes (Addendum)
[facility] EMERGENCY DEPARTMENT Provider Note   CSN: [id number] Arrival date & time: 10/26/22  5625     History  Chief Complaint  Patient presents with   Torticollis    Nicholas [name] is a 61 y.o. male.  Patient with PMH chronic pain syndrome on daily oxycodone, degenerative disc disease with fusion of C4-C7, mood disorder, hx hemiplegic migrain, HTN, DM, CKD IIIa, and BPH presents with neck pain and stiffness. States he woke up yesterday morning it was stuck couldn't not turn it. f he coughs or sneezes it pops. Tried tiger balm with some improvement. Denies loss of sensation. Has electrostimulation at home to try and loosen the muslces and did not help. Took percocets for the pain. Takes them as needed.      Home Medications Prior to Admission medications   Medication Sig Start Date End Date Taking? Authorizing Provider  acetaminophen (TYLENOL) 325 MG tablet Take 2 tablets (650 mg total) by mouth every 6 (six) hours as needed for mild pain (or Fever >/= 101). 07/01/22   Danford, Earl Lites, MD  albuterol (PROVENTIL HFA;VENTOLIN HFA) 108 (90 Base) MCG/ACT inhaler Inhale 1-2 puffs into the lungs every 6 (six) hours as needed for wheezing or shortness of breath. 11/15/18   Rancour, Jeannett Senior, MD  atorvastatin (LIPITOR) 80 MG tablet Take 1 tablet (80 mg total) by mouth at bedtime. 09/05/20   Danford, Earl Lites, MD  budesonide-formoterol (SYMBICORT) 80-4.5 MCG/ACT inhaler Inhale 2 puffs into the lungs daily.    [provider]  clopidogrel (PLAVIX) 75 MG tablet Take 1 tablet (75 mg total) by mouth daily. Patient not taking: Reported on 06/27/2022 07/05/20   Azucena Fallen, MD  diclofenac Sodium (VOLTAREN ARTHRITIS PAIN) 1 % GEL Apply 2 g topically 4 (four) times daily. 07/01/22   Danford, Earl Lites, MD  Eszopiclone 3 MG TABS Take 1 tablet (3 mg total) by mouth at bedtime. 07/01/22   Danford, Earl Lites, MD  fenofibrate (TRICOR) 145 MG tablet Take 1  tablet (145 mg total) by mouth daily. Patient not taking: Reported on 06/27/2022 11/02/20   Swayze, Ava, DO  gabapentin (NEURONTIN) 600 MG tablet Take 1 tablet (600 mg total) by mouth daily at 6 (six) AM. Patient taking differently: Take 600 mg by mouth every 6 (six) hours. 12/06/20   Swayze, Ava, DO  HUMALOG KWIKPEN 100 UNIT/ML KwikPen Inject 0-16 Units into the skin 4 (four) times daily as needed (high blood sugar). Inject into the skin up to 4 times a day, per sliding scale: BGL 160 or greater = 10 units; 200 or greater = 14 units 11/05/20   [provider]  lactulose (CHRONULAC) 10 GM/15ML solution Take 30 g by mouth daily as needed for moderate constipation. 05/12/22   [provider]  latanoprost (XALATAN) 0.005 % ophthalmic solution Place 1 drop into both eyes at bedtime.  07/04/20   [provider]  lurasidone (LATUDA) 20 MG TABS tablet Take 1 tablet by mouth at bedtime. 06/14/22   [provider]  metFORMIN (GLUCOPHAGE) 1000 MG tablet Take 1,000 mg by mouth 2 (two) times daily. 11/04/20   [provider]  nortriptyline (PAMELOR) 50 MG capsule Take 100 mg by mouth at bedtime.     [provider]  oxyCODONE-acetaminophen (PERCOCET/ROXICET) 5-325 MG tablet Take 1 tablet by mouth every 8 (eight) hours as needed for moderate pain. 07/01/22   Danford, Earl Lites, MD  SUMAtriptan (IMITREX) 100 MG tablet Take 1 tablet (100 mg  total) by mouth daily as needed for migraine or headache. May repeat in 2 hours if headache persists or recurs. 07/25/20   Leatha Gilding, MD  tamsulosin (FLOMAX) 0.4 MG CAPS capsule Take 0.4 mg by mouth daily as needed (prostate).    [provider]  timolol (TIMOPTIC) 0.5 % ophthalmic solution Place 1 drop into both eyes daily. 04/06/20   [provider]  TOUJEO MAX SOLOSTAR 300 UNIT/ML Solostar Pen Inject 33 Units into the skin at bedtime. 07/01/22   Danford, Earl Lites, MD  traZODone (DESYREL) 100 MG  tablet Take 100 mg by mouth at bedtime. 06/25/22   [provider]  verapamil (CALAN-SR) 120 MG CR tablet Take 120 mg by mouth daily. 05/09/22   [provider]  zonisamide (ZONEGRAN) 100 MG capsule Take 200 mg by mouth 2 (two) times daily. 04/10/22   [provider]      Allergies    Finasteride, Levothyroxine, Nsaids, Amoxicillin, Ampicillin, Penicillins, Strawberry extract, Asa [aspirin], Bactrim [sulfamethoxazole-trimethoprim], Dapagliflozin, Depakote [divalproex sodium], Dilantin [phenytoin], Methocarbamol, Other, Risperidone and related, Sitagliptin, Strawberry (diagnostic), Sulfa antibiotics, Tolmetin, Ultram [tramadol hcl], Buprenorphine, and Oatmeal    Review of Systems   Review of Systems  Musculoskeletal:  Positive for neck pain and neck stiffness.  Neurological:  Negative for dizziness.    Physical Exam Updated Vital Signs BP 130/80 (BP Location: Right Arm)   Pulse 68   Temp 97.9 F (36.6 C)   Resp 15   SpO2 96%  Physical Exam Constitutional:      General: He is not in acute distress.    Appearance: Normal appearance.  HENT:     Head: Normocephalic and atraumatic.  Eyes:     Extraocular Movements: Extraocular movements intact.     Conjunctiva/sclera: Conjunctivae normal.     Pupils: Pupils are equal, round, and reactive to light.  Neck:     Comments: Limited ROM Cardiovascular:     Rate and Rhythm: Normal rate and regular rhythm.     Pulses: Normal pulses.     Heart sounds: Normal heart sounds.  Pulmonary:     Effort: Pulmonary effort is normal.     Breath sounds: Normal breath sounds.  Abdominal:     General: There is no distension.     Palpations: Abdomen is soft.  Musculoskeletal:     Cervical back: Neck supple. Tenderness present.  Skin:    General: Skin is warm and dry.  Neurological:     General: No focal deficit present.     Mental Status: He is alert.     ED Results / Procedures / Treatments   Labs (all labs ordered are  listed, but only abnormal results are displayed) Labs Reviewed - No data to display  EKG None  Radiology No results found.  Procedures Procedures    Medications Ordered in ED Medications - No data to display  ED Course/ Medical Decision Making/ A&P                           MDM  Patient presents with neck pain and stiffness that started yesterday morning. Physical exam significant for tenderness along trapezius muscles and C4-C7 region. Mildly reduced range of motion. UE strength normal and sensation in tact. Suspect muscle spasm. Low likihood for acute abnormalities of the cervical region given no inciting trauma. Gave patient Robaxin and discharged him with it. Discussed following up with PCP and getting set up with physical therapy.  Final Clinical Impression(s) / ED Diagnoses Final diagnoses:  None    Rx / DC Orders ED Discharge Orders     None         Cora Collum, DO 10/26/22 0908    Cora Collum, DO 10/26/22 1771    Jacalyn Lefevre, MD 10/26/22 1019

## 2022-11-12 ENCOUNTER — Other Ambulatory Visit (HOSPITAL_BASED_OUTPATIENT_CLINIC_OR_DEPARTMENT_OTHER): Payer: Self-pay

## 2022-11-12 MED ORDER — OXYCODONE-ACETAMINOPHEN 10-325 MG PO TABS
ORAL_TABLET | ORAL | 0 refills | Status: DC
  Filled 2022-11-12: qty 90, 30d supply, fill #0

## 2022-11-16 ENCOUNTER — Other Ambulatory Visit: Payer: Self-pay

## 2022-11-16 ENCOUNTER — Emergency Department (HOSPITAL_BASED_OUTPATIENT_CLINIC_OR_DEPARTMENT_OTHER)
Admission: EM | Admit: 2022-11-16 | Discharge: 2022-11-16 | Disposition: A | Discharge status: Home / Self Care | Payer: Medicare HMO | Attending: Emergency Medicine | Admitting: Emergency Medicine

## 2022-11-16 ENCOUNTER — Encounter (HOSPITAL_BASED_OUTPATIENT_CLINIC_OR_DEPARTMENT_OTHER): Payer: Self-pay | Admitting: Emergency Medicine

## 2022-11-16 ENCOUNTER — Emergency Department (HOSPITAL_BASED_OUTPATIENT_CLINIC_OR_DEPARTMENT_OTHER): Payer: Medicare HMO

## 2022-11-16 DIAGNOSIS — W25XXXA Contact with sharp glass, initial encounter: Secondary | ICD-10-CM | POA: Diagnosis not present

## 2022-11-16 DIAGNOSIS — Z7984 Long term (current) use of oral hypoglycemic drugs: Secondary | ICD-10-CM | POA: Insufficient documentation

## 2022-11-16 DIAGNOSIS — J45909 Unspecified asthma, uncomplicated: Secondary | ICD-10-CM | POA: Diagnosis not present

## 2022-11-16 DIAGNOSIS — E119 Type 2 diabetes mellitus without complications: Secondary | ICD-10-CM | POA: Insufficient documentation

## 2022-11-16 DIAGNOSIS — Z7951 Long term (current) use of inhaled steroids: Secondary | ICD-10-CM | POA: Insufficient documentation

## 2022-11-16 DIAGNOSIS — I10 Essential (primary) hypertension: Secondary | ICD-10-CM | POA: Diagnosis not present

## 2022-11-16 DIAGNOSIS — Z79899 Other long term (current) drug therapy: Secondary | ICD-10-CM | POA: Diagnosis not present

## 2022-11-16 DIAGNOSIS — S90852A Superficial foreign body, left foot, initial encounter: Secondary | ICD-10-CM | POA: Diagnosis not present

## 2022-11-16 DIAGNOSIS — Z794 Long term (current) use of insulin: Secondary | ICD-10-CM | POA: Insufficient documentation

## 2022-11-16 HISTORY — DX: Cerebral infarction, unspecified: I63.9

## 2022-11-16 MED ORDER — CEPHALEXIN 500 MG PO CAPS: 500.0000 mg | ORAL_CAPSULE | Freq: Two times a day (BID) | ORAL | 0 refills | Status: AC

## 2022-11-16 NOTE — ED Provider Notes (Signed)
[facility] EMERGENCY DEPARTMENT Provider Note   CSN: [id number] Arrival date & time: 11/16/22  3474     History  Chief Complaint  Patient presents with   Foreign Body    Foot    Nicholas [name] is a 61 y.o. male. 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 Glass coffee table shattered (by great granddaughter on accident). Stepped on the glass while cleaning it up. Some bleeding initially. Tried removing with tweezers.      Home Medications Prior to Admission medications   Medication Sig Start Date End Date Taking? Authorizing Provider  acetaminophen (TYLENOL) 325 MG tablet Take 2 tablets (650 mg total) by mouth every 6 (six) hours as needed for mild pain (or Fever >/= 101). 07/01/22   Danford, Earl Lites, MD  albuterol (PROVENTIL HFA;VENTOLIN HFA) 108 (90 Base) MCG/ACT inhaler Inhale 1-2 puffs into the lungs every 6 (six) hours as needed for wheezing or shortness of breath. 11/15/18   Rancour, Jeannett Senior, MD  atorvastatin (LIPITOR) 80 MG tablet Take 1 tablet (80 mg total) by mouth at bedtime. 09/05/20   Danford, Earl Lites, MD  budesonide-formoterol (SYMBICORT) 80-4.5 MCG/ACT inhaler Inhale 2 puffs into the lungs daily.    [provider]  cephALEXin (KEFLEX) 500 MG capsule Take 1 capsule (500 mg total) by mouth 2 (two) times daily for 7 days. 11/16/22 11/23/22  Yes Adron Bene, MD  clopidogrel (PLAVIX) 75 MG tablet Take 1 tablet (75 mg total) by mouth daily. Patient not taking: Reported on 06/27/2022 07/05/20   Azucena Fallen, MD  diclofenac Sodium (VOLTAREN ARTHRITIS PAIN) 1 % GEL Apply 2 g topically 4 (four) times daily. 07/01/22   Danford, Earl Lites, MD  Eszopiclone 3 MG TABS Take 1 tablet (3 mg total) by mouth at bedtime. 07/01/22   Danford, Earl Lites, MD  fenofibrate (TRICOR) 145 MG tablet Take 1 tablet (145 mg total) by mouth daily. Patient not taking: Reported on 06/27/2022 11/02/20   Swayze, Ava, DO  gabapentin (NEURONTIN) 600 MG tablet Take 1 tablet (600 mg total) by mouth daily at 6 (six) AM. Patient taking differently: Take 600 mg by mouth every 6 (six) hours. 12/06/20   Swayze, Ava, DO  HUMALOG KWIKPEN 100 UNIT/ML KwikPen Inject 0-16 Units into the skin 4 (four) times daily as needed (high blood sugar). Inject into the skin up to 4 times a day, per sliding scale: BGL 160 or greater = 10 units; 200 or greater = 14 units 11/05/20   [provider]  lactulose (CHRONULAC) 10 GM/15ML solution Take 30 g by mouth daily as needed for moderate constipation. 05/12/22   [provider]  latanoprost (XALATAN) 0.005 %  ophthalmic solution Place 1 drop into both eyes at bedtime.  07/04/20   [provider]  lurasidone (LATUDA) 20 MG TABS tablet Take 1 tablet by mouth at bedtime. 06/14/22   [provider]  metFORMIN (GLUCOPHAGE) 1000 MG tablet Take 1,000 mg by mouth 2 (two) times daily. 11/04/20   [provider]  methocarbamol (ROBAXIN-750) 750 MG tablet Take 1 tablet (750 mg total) by mouth every 8 (eight) hours as needed for muscle spasms. 10/26/22   Cora Collum, DO  nortriptyline (PAMELOR) 50 MG capsule Take 100 mg by mouth at bedtime.     [provider]  oxyCODONE-acetaminophen (PERCOCET) 10-325 MG tablet Take 1 tablet 3 times a day by oral route as directed for 30 days. 11/12/22      oxyCODONE-acetaminophen (PERCOCET/ROXICET) 5-325 MG tablet Take 1 tablet by mouth every 8 (eight) hours as needed for moderate pain. 07/01/22   Danford, Earl Lites, MD  SUMAtriptan (IMITREX) 100 MG tablet Take 1 tablet (100 mg total) by mouth daily as needed for migraine or headache. May repeat in 2 hours if headache persists or recurs. 07/25/20   Leatha Gilding, MD  tamsulosin (FLOMAX) 0.4 MG CAPS capsule Take 0.4 mg by mouth daily as needed (prostate).    [provider]  timolol (TIMOPTIC) 0.5 % ophthalmic solution Place 1 drop into both eyes daily. 04/06/20   [provider]  TOUJEO MAX SOLOSTAR 300 UNIT/ML Solostar Pen Inject 33 Units into the skin at bedtime. 07/01/22   Danford, Earl Lites, MD  traZODone (DESYREL) 100 MG tablet Take 100 mg by mouth at bedtime. 06/25/22   [provider]  verapamil (CALAN-SR) 120 MG CR tablet Take 120 mg by mouth daily. 05/09/22   [provider]  zonisamide (ZONEGRAN) 100 MG capsule Take 200 mg by mouth 2 (two) times daily. 04/10/22   [provider]      Allergies    Finasteride, Levothyroxine, Nsaids, Amoxicillin, Ampicillin, Penicillins, Strawberry extract, Asa [aspirin], Bactrim [sulfamethoxazole-trimethoprim], Dapagliflozin, Depakote [divalproex sodium], Dilantin [phenytoin], Methocarbamol, Other, Risperidone and related, Sitagliptin, Strawberry (diagnostic), Sulfa antibiotics, Tolmetin, Ultram [tramadol hcl], Buprenorphine, and Oatmeal    Review of Systems   Review of Systems  Physical Exam Updated Vital Signs Ht 5\' 7"  (1.702 m)   Wt 83.9 kg   BMI 28.98 kg/m  Physical Exam Constitutional:      General: He is not in acute distress. Skin:    Comments: Left foot with small healing laceration at base of 3rd toe. No foreign body identified. Minimal erythema and tenderness but no induration or fluctuance.  Neurological:     Mental Status: He is alert.     ED Results / Procedures / Treatments    Labs (all labs ordered are listed, but only abnormal results are displayed) Labs Reviewed - No data to display  EKG None  Radiology DG Foot 2 Views Left  Result Date: 11/16/2022 CLINICAL DATA:  Concern for foreign body, stepped on glass EXAM: LEFT FOOT - 2 VIEW COMPARISON:  None Available. FINDINGS: Normal alignment without acute osseous finding or fracture. No joint abnormality or significant arthropathy. On the lateral view, there is a thin linear radiopaque foreign body along the plantar surface of the foot beneath the proximal phalanges. This is not clearly present on the frontal view for further localization. IMPRESSION: Thin linear radiopaque foreign body along the plantar surface of the foot beneath the proximal phalanges on the lateral view. Electronically Signed   By: 14/10/2022.  Shick  M.D.   On: 11/16/2022 10:27    Procedures Procedures    Medications Ordered in ED Medications - No data to display  ED Course/ Medical Decision Making/ A&P                           Medical Decision Making Amount and/or Complexity of Data Reviewed Radiology: ordered.   Patient presents after stepping on glass last night. Not foreign body identified on exam. Does not appear infected. Tdap up to date. XR left foot with small foreign body corresponding to location of pain, presumable glass shard. Will provide Keflex and refer to podiatry.        Final Clinical Impression(s) / ED Diagnoses Final diagnoses:  Foreign body in left foot, initial encounter    Rx / DC Orders ED Discharge Orders          Ordered    cephALEXin (KEFLEX) 500 MG capsule  2 times daily        11/16/22 1037              Adron Bene, MD 11/16/22 1039    Maia Plan, MD 11/20/22 [id number]

## 2022-11-16 NOTE — Discharge Instructions (Signed)
You came in with foot pain after stepping on glass. Imaging revealed a small foreign body (likely glass) in your foot that corresponds to the location of your pain. I will provide a course of antibiotics and you should call the number provided to schedule a visit with podiatry (foot doctor).

## 2022-11-16 NOTE — ED Triage Notes (Signed)
Patient presents C/O of piece of glass stuck in his foot since last night. States he stepped on it approx 11PM. Hx diabetes

## 2022-11-18 ENCOUNTER — Ambulatory Visit (INDEPENDENT_AMBULATORY_CARE_PROVIDER_SITE_OTHER): Payer: Medicare HMO

## 2022-11-18 ENCOUNTER — Encounter: Payer: Self-pay | Admitting: Podiatry

## 2022-11-18 ENCOUNTER — Ambulatory Visit (INDEPENDENT_AMBULATORY_CARE_PROVIDER_SITE_OTHER): Payer: Medicare HMO | Admitting: Podiatry

## 2022-11-18 VITALS — BP 116/65

## 2022-11-18 DIAGNOSIS — S90852A Superficial foreign body, left foot, initial encounter: Secondary | ICD-10-CM | POA: Diagnosis not present

## 2022-11-18 NOTE — Progress Notes (Signed)
Subjective:   Patient ID: Nicholas [name], male   DOB: 61 y.o.   MRN: [id number]   HPI Patient states he stepped on a shattered glass 4 days ago and has pieces in the bottom of his foot and several areas and he went to the emergency room and they did not feel like they can do this adequately and referred him to me   ROS      Objective:  Physical Exam  Neurovascular status intact sugar has been doing well with A1c around 7 with area of trauma plantar aspect left foot with what appears to be 3-4 separate issues associated with what appears to be glass in the foot     Assessment:  Significant trauma left plantar foot history of diabetes under excellent control with exquisite discomfort associated     Plan:  H&P x-ray reviewed sterile prep garment reviewed condition at great length and using sterile sharp debridement techniques I opened up 3 areas and I was able to remove glass from 3 separate areas.  I flushed copious vancomycin solution applied sterile dressing to the area instructed on elevation reduced activity and this should heal uneventfully.  Gave him strict instructions of any erythema edema or other pathology were to occur to let us know immediately  X-rays were negative currently for signs of bony injury does show small pieces of glass shards plantar aspect left

## 2022-11-21 ENCOUNTER — Other Ambulatory Visit: Payer: Self-pay

## 2022-11-21 ENCOUNTER — Emergency Department (HOSPITAL_COMMUNITY)
Admission: EM | Admit: 2022-11-21 | Discharge: 2022-11-21 | Discharge status: Against Medical Advice | Payer: Medicare HMO | Attending: Emergency Medicine | Admitting: Emergency Medicine

## 2022-11-21 ENCOUNTER — Emergency Department (HOSPITAL_COMMUNITY): Payer: Medicare HMO

## 2022-11-21 DIAGNOSIS — S0990XA Unspecified injury of head, initial encounter: Secondary | ICD-10-CM | POA: Insufficient documentation

## 2022-11-21 DIAGNOSIS — J3489 Other specified disorders of nose and nasal sinuses: Secondary | ICD-10-CM | POA: Insufficient documentation

## 2022-11-21 DIAGNOSIS — R0981 Nasal congestion: Secondary | ICD-10-CM | POA: Insufficient documentation

## 2022-11-21 DIAGNOSIS — R058 Other specified cough: Secondary | ICD-10-CM | POA: Diagnosis not present

## 2022-11-21 DIAGNOSIS — R059 Cough, unspecified: Secondary | ICD-10-CM | POA: Insufficient documentation

## 2022-11-21 DIAGNOSIS — Z5321 Procedure and treatment not carried out due to patient leaving prior to being seen by health care provider: Secondary | ICD-10-CM | POA: Insufficient documentation

## 2022-11-21 DIAGNOSIS — Z1152 Encounter for screening for COVID-19: Secondary | ICD-10-CM | POA: Diagnosis not present

## 2022-11-21 LAB — RESP PANEL BY RT-PCR (RSV, FLU A&B, COVID)  RVPGX2
Influenza A by PCR: NEGATIVE
Influenza B by PCR: NEGATIVE
Resp Syncytial Virus by PCR: NEGATIVE
SARS Coronavirus 2 by RT PCR: NEGATIVE

## 2022-11-21 NOTE — ED Provider Triage Note (Signed)
Emergency Medicine Provider Triage Evaluation Note  ERVINE WITUCKI , a 61 y.o. male  was evaluated in triage.  Pt complains of cough and cold symptoms for the past 4 days.  Patient reports productive cough with green mucus, rhinorrhea, nasal congestion.  He thinks this is related to the Keflex he was started on.  No trouble swallowing or trouble breathing.  Review of Systems  Positive:  Negative:   Physical Exam  BP 127/87 (BP Location: Left Arm)   Pulse 76   Temp 98.1 F (36.7 C) (Oral)   Resp 16   Ht 5\' 7"  (1.702 m)   Wt 83.5 kg   SpO2 97%   BMI 28.82 kg/m  Gen:   Awake, no distress   Resp:  Normal effort  MSK:   Moves extremities without difficulty  Other:    Medical Decision Making  Medically screening exam initiated at 6:56 AM.  Appropriate orders placed.  was informed that the remainder of the evaluation will be completed by another provider, this initial triage assessment does not replace that evaluation, and the importance of remaining in the ED until their evaluation is complete.  CXR and viral swab ordered   [name], Achille Rich 11/21/22 11/23/22

## 2022-11-21 NOTE — ED Triage Notes (Signed)
Patient coming to ED for evaluation of nasal congestion, cough, tight chest, sore throat, and "green sputum." Symptoms started on Wednesday.  States "I thought it might be a reaction to the antibiotic they started me on for my foot, but I am not sure."  Started Keflex for foot infection on Tuesday.  Discontinued medication with onset of symptoms.  No reports of rash

## 2022-11-22 ENCOUNTER — Encounter (HOSPITAL_BASED_OUTPATIENT_CLINIC_OR_DEPARTMENT_OTHER): Payer: Self-pay

## 2022-11-22 ENCOUNTER — Other Ambulatory Visit: Payer: Self-pay

## 2022-11-22 ENCOUNTER — Emergency Department (HOSPITAL_BASED_OUTPATIENT_CLINIC_OR_DEPARTMENT_OTHER)
Admission: EM | Admit: 2022-11-22 | Discharge: 2022-11-22 | Disposition: A | Discharge status: Home / Self Care | Payer: Medicare HMO | Attending: Emergency Medicine | Admitting: Emergency Medicine

## 2022-11-22 DIAGNOSIS — J069 Acute upper respiratory infection, unspecified: Secondary | ICD-10-CM | POA: Diagnosis not present

## 2022-11-22 DIAGNOSIS — R0981 Nasal congestion: Secondary | ICD-10-CM | POA: Diagnosis present

## 2022-11-22 DIAGNOSIS — Z7902 Long term (current) use of antithrombotics/antiplatelets: Secondary | ICD-10-CM | POA: Insufficient documentation

## 2022-11-22 NOTE — ED Triage Notes (Signed)
Pt POV steady gait-  Pt reports new cough, sore throat, congestion since Tuesday, progressively worsening.  Seen at [facility] for same, left prior to being seen.

## 2022-11-22 NOTE — ED Provider Notes (Signed)
[facility] EMERGENCY DEPARTMENT Provider Note   CSN: [id number] Arrival date & time: 11/22/22  7824     History  Chief Complaint  Patient presents with   Nasal Congestion    Nicholas [name] is a 61 y.o. male.  The history is provided by the patient.  URI Presenting symptoms: congestion and cough   Presenting symptoms: no fever   Severity:  Moderate Onset quality:  Gradual Duration:  4 days Timing:  Constant Progression:  Unchanged Chronicity:  New Relieved by:  Nothing Worsened by:  Nothing Associated symptoms: no swollen glands and no wheezing   Risk factors: not elderly        Home Medications Prior to Admission medications   Medication Sig Start Date End Date Taking? Authorizing Provider  acetaminophen (TYLENOL) 325 MG tablet Take 2 tablets (650 mg total) by mouth every 6 (six) hours as needed for mild pain (or Fever >/= 101). 07/01/22   Danford, Earl Lites, MD  albuterol (PROVENTIL HFA;VENTOLIN HFA) 108 (90 Base) MCG/ACT inhaler Inhale 1-2 puffs into the lungs every 6 (six) hours as needed for wheezing or shortness of breath. 11/15/18   Rancour, Jeannett Senior, MD  atorvastatin (LIPITOR) 80 MG tablet Take 1 tablet (80 mg total) by mouth at bedtime. 09/05/20   Danford, Earl Lites, MD  budesonide-formoterol (SYMBICORT) 80-4.5 MCG/ACT inhaler Inhale 2 puffs into the lungs daily.    [provider]  cephALEXin (KEFLEX) 500 MG capsule Take 1 capsule (500 mg total) by mouth 2 (two) times daily for 7 days. 11/16/22 11/23/22  Adron Bene, MD  clopidogrel (PLAVIX) 75 MG tablet Take 1 tablet (75 mg total) by mouth daily. Patient not taking: Reported on 06/27/2022 07/05/20   Azucena Fallen, MD  diclofenac Sodium (VOLTAREN ARTHRITIS PAIN) 1 % GEL Apply 2 g topically 4 (four) times daily. 07/01/22   Danford, Earl Lites, MD  Eszopiclone 3 MG TABS Take 1 tablet (3 mg total) by mouth at bedtime. 07/01/22   Danford, Earl Lites, MD  fenofibrate (TRICOR)  145 MG tablet Take 1 tablet (145 mg total) by mouth daily. Patient not taking: Reported on 06/27/2022 11/02/20   Swayze, Ava, DO  gabapentin (NEURONTIN) 600 MG tablet Take 1 tablet (600 mg total) by mouth daily at 6 (six) AM. Patient taking differently: Take 600 mg by mouth every 6 (six) hours. 12/06/20   Swayze, Ava, DO  HUMALOG KWIKPEN 100 UNIT/ML KwikPen Inject 0-16 Units into the skin 4 (four) times daily as needed (high blood sugar). Inject into the skin up to 4 times a day, per sliding scale: BGL 160 or greater = 10 units; 200 or greater = 14 units 11/05/20   [provider]  lactulose (CHRONULAC) 10 GM/15ML solution Take 30 g by mouth daily as needed for moderate constipation. 05/12/22   [provider]  latanoprost (XALATAN) 0.005 % ophthalmic solution Place 1 drop into both eyes at bedtime.  07/04/20   [provider]  lurasidone (LATUDA) 20 MG TABS tablet Take 1 tablet by mouth at bedtime. 06/14/22   [provider]  metFORMIN (GLUCOPHAGE) 1000 MG tablet Take 1,000 mg by mouth 2 (two) times daily. 11/04/20   [provider]  methocarbamol (ROBAXIN-750) 750 MG tablet Take 1 tablet (750 mg total) by mouth every 8 (eight) hours as needed for muscle spasms. 10/26/22   Cora Collum, DO  nortriptyline (PAMELOR) 50 MG capsule Take 100 mg by mouth at bedtime.     [provider]  oxyCODONE-acetaminophen (PERCOCET) 10-325 MG tablet Take 1 tablet 3 times a day by oral route as directed for 30 days. 11/12/22     oxyCODONE-acetaminophen (PERCOCET/ROXICET) 5-325 MG tablet Take 1 tablet by mouth every 8 (eight) hours as needed for moderate pain. 07/01/22   Danford, Earl Lites, MD  SUMAtriptan (IMITREX) 100 MG tablet Take 1 tablet (100 mg total) by mouth daily as needed for migraine or headache. May repeat in 2 hours if headache persists or recurs. 07/25/20   Leatha Gilding, MD  tamsulosin (FLOMAX) 0.4 MG CAPS capsule Take 0.4 mg by mouth daily as  needed (prostate).    [provider]  timolol (TIMOPTIC) 0.5 % ophthalmic solution Place 1 drop into both eyes daily. 04/06/20   [provider]  TOUJEO MAX SOLOSTAR 300 UNIT/ML Solostar Pen Inject 33 Units into the skin at bedtime. 07/01/22   Danford, Earl Lites, MD  traZODone (DESYREL) 100 MG tablet Take 100 mg by mouth at bedtime. 06/25/22   [provider]  verapamil (CALAN-SR) 120 MG CR tablet Take 120 mg by mouth daily. 05/09/22   [provider]  zonisamide (ZONEGRAN) 100 MG capsule Take 200 mg by mouth 2 (two) times daily. 04/10/22   [provider]      Allergies    Finasteride, Levothyroxine, Nsaids, Amoxicillin, Ampicillin, Penicillins, Strawberry extract, Asa [aspirin], Bactrim [sulfamethoxazole-trimethoprim], Dapagliflozin, Depakote [divalproex sodium], Dilantin [phenytoin], Methocarbamol, Other, Risperidone and related, Sitagliptin, Strawberry (diagnostic), Sulfa antibiotics, Tolmetin, Ultram [tramadol hcl], Buprenorphine, and Oatmeal    Review of Systems   Review of Systems  Constitutional:  Negative for fever.  HENT:  Positive for congestion. Negative for facial swelling.   Eyes:  Negative for redness.  Respiratory:  Positive for cough. Negative for shortness of breath and wheezing.   Cardiovascular:  Negative for chest pain.  All other systems reviewed and are negative.   Physical Exam Updated Vital Signs BP 130/87   Pulse 84   Temp 98 F (36.7 C) (Oral)   Resp 17   SpO2 99%  Physical Exam Vitals and nursing note reviewed.  Constitutional:      General: He is not in acute distress.    Appearance: Normal appearance. He is well-developed. He is not diaphoretic.  HENT:     Head: Normocephalic and atraumatic.     Nose: Nose normal.  Eyes:     Conjunctiva/sclera: Conjunctivae normal.     Pupils: Pupils are equal, round, and reactive to light.  Cardiovascular:     Rate and Rhythm: Normal rate and regular rhythm.     Pulses:  Normal pulses.     Heart sounds: Normal heart sounds.  Pulmonary:     Effort: Pulmonary effort is normal.     Breath sounds: Normal breath sounds. No wheezing or rales.  Abdominal:     General: Abdomen is flat. Bowel sounds are normal.     Palpations: Abdomen is soft.     Tenderness: There is no abdominal tenderness. There is no guarding or rebound.  Musculoskeletal:        General: Normal range of motion.     Cervical back: Normal range of motion and neck supple.  Skin:    General: Skin is warm and dry.     Capillary Refill: Capillary refill takes less than 2 seconds.  Neurological:     General: No focal deficit present.     Mental Status: He is alert and oriented to person, place, and time.     Deep  Tendon Reflexes: Reflexes normal.  Psychiatric:        Mood and Affect: Mood normal.        Behavior: Behavior normal.     ED Results / Procedures / Treatments   Labs (all labs ordered are listed, but only abnormal results are displayed) Labs Reviewed - No data to display  EKG None  Radiology DG Chest 2 View  Result Date: 11/21/2022 CLINICAL DATA:  61 year old male with history of cough. EXAM: CHEST - 2 VIEW COMPARISON:  Chest x-ray 06/20/2021. FINDINGS: Lung volumes are normal. No consolidative airspace disease. No pleural effusions. No pneumothorax. No pulmonary nodule or mass noted. Pulmonary vasculature and the cardiomediastinal silhouette are within normal limits. Orthopedic fixation hardware in the lower cervical spine incidentally noted. Surgical clip near the gastroesophageal junction. IMPRESSION: 1.  No radiographic evidence of acute cardiopulmonary disease. Electronically Signed   By: Trudie Reed M.D.   On: 11/21/2022 07:14    Procedures Procedures    Medications Ordered in ED Medications - No data to display  ED Course/ Medical Decision Making/ A&P                           Medical Decision Making Patient with URI symptoms since Wednesday.  Went to [facility]  and had swabs and CXR but did not wait for results.    Amount and/or Complexity of Data Reviewed External Data Reviewed: notes.    Details: Previous notes reviewed  Labs: ordered.    Details: Negative covid and flu  Radiology: ordered and independent interpretation performed.    Details: CXR at [facility] was negative   Risk Risk Details: Patient is very well appearing with quick and steady gate and normal exam and vitals signs.  CXR and swabs are normal and reassuring.  This is a viral URI.  Stable for discharge with close follow up.  Strict return.      Final Clinical Impression(s) / ED Diagnoses Final diagnoses:  Viral upper respiratory tract infection   Return for intractable cough, coughing up blood, fevers > 100.4 unrelieved by medication, shortness of breath, intractable vomiting, chest pain, shortness of breath, weakness, numbness, changes in speech, facial asymmetry, abdominal pain, passing out, Inability to tolerate liquids or food, cough, altered mental status or any concerns. No signs of systemic illness or infection. The patient is nontoxic-appearing on exam and vital signs are within normal limits.  I have reviewed the triage vital signs and the nursing notes. Pertinent labs & imaging results that were available during my care of the patient were reviewed by me and considered in my medical decision making (see chart for details). After history, exam, and medical workup I feel the patient has been appropriately medically screened and is safe for discharge home. Pertinent diagnoses were discussed with the patient. Patient was given return precautions.  Rx / DC Orders ED Discharge Orders     None         Cheyeanne Roadcap, MD 11/22/22 1610

## 2022-12-31 ENCOUNTER — Other Ambulatory Visit: Payer: Self-pay | Admitting: Family Medicine

## 2022-12-31 ENCOUNTER — Other Ambulatory Visit: Payer: Self-pay

## 2022-12-31 ENCOUNTER — Encounter: Payer: Self-pay | Admitting: Family Medicine

## 2022-12-31 ENCOUNTER — Ambulatory Visit (INDEPENDENT_AMBULATORY_CARE_PROVIDER_SITE_OTHER): Payer: 59 | Admitting: Family Medicine

## 2022-12-31 VITALS — BP 106/78 | HR 97 | Ht 67.0 in | Wt 185.0 lb

## 2022-12-31 DIAGNOSIS — I10 Essential (primary) hypertension: Secondary | ICD-10-CM | POA: Diagnosis not present

## 2022-12-31 DIAGNOSIS — Z794 Long term (current) use of insulin: Secondary | ICD-10-CM

## 2022-12-31 DIAGNOSIS — Z981 Arthrodesis status: Secondary | ICD-10-CM

## 2022-12-31 DIAGNOSIS — E1140 Type 2 diabetes mellitus with diabetic neuropathy, unspecified: Secondary | ICD-10-CM | POA: Diagnosis not present

## 2022-12-31 DIAGNOSIS — Z1159 Encounter for screening for other viral diseases: Secondary | ICD-10-CM

## 2022-12-31 DIAGNOSIS — I639 Cerebral infarction, unspecified: Secondary | ICD-10-CM | POA: Diagnosis not present

## 2022-12-31 DIAGNOSIS — R42 Dizziness and giddiness: Secondary | ICD-10-CM

## 2022-12-31 DIAGNOSIS — M542 Cervicalgia: Secondary | ICD-10-CM

## 2022-12-31 LAB — POCT GLYCOSYLATED HEMOGLOBIN (HGB A1C): HbA1c, POC (controlled diabetic range): 7 % (ref 0.0–7.0)

## 2022-12-31 MED ORDER — VERAPAMIL HCL ER 100 MG PO CP24
100.0000 mg | ORAL_CAPSULE | Freq: Every day | ORAL | 0 refills | Status: DC
Start: 2022-12-31 — End: 2023-02-01

## 2022-12-31 NOTE — Progress Notes (Deleted)
    SUBJECTIVE:   CHIEF COMPLAINT / HPI: New patient   Dizziness Patient has been getting dizzy. Denies any blood per rectum. Has been having lower blood pressures.   Social History  His daughter passed away a year ago. He has some sons that he does not communicate with.  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   Patient says that when he was a patient at [facility] he had a form that would be sent from the office for [employer] pull ups, as patient has incontinence. Patient said he would reach out to [place]  PMH / PSH: CVA in 2019, TIA x 2 in 2020,   OBJECTIVE:   BP 106/78   Pulse 97   Ht 5\' 7"  (1.702 m)   Wt 185 lb (83.9 kg)   SpO2 97%   BMI 28.98 kg/m   General: well appearing, in no acute distress CV: RRR, radial pulses equal and palpable, no BLE edema  Resp: Normal work of breathing on room air, CTAB Abd: Soft, non tender, non distended  Neuro: Alert & Oriented x 4    ASSESSMENT/PLAN:   Dizziness Dizziness most likely due to over treatment of hypertension as patient is mildly hypotensive in clinic today. Unlikely due to anemia or blood loss. Unlikely due to electrolyte abnormalities.  - CMP  - Decrease verapamil from 120 to 100 mg  - F/u in 4 weeks      [name], MD [place]

## 2022-12-31 NOTE — Patient Instructions (Signed)
It was wonderful to see you today.  Please bring ALL of your medications with you to every visit.   Today we talked about:  Blood pressure - Most likely your blood pressure is a little low causing you to feel dizzy. I decreased your medication.   I also ordered PT for your neck pain   Please follow up in 1 month   Thank you for choosing [facility].   Please call [phone] with any questions about today's appointment.  Please be sure to schedule follow up at the front desk before you leave today.   [name], MD  Family Medicine

## 2023-01-01 LAB — LIPID PANEL
Chol/HDL Ratio: 4.5 ratio (ref 0.0–5.0)
Cholesterol, Total: 176 mg/dL (ref 100–199)
HDL: 39 mg/dL — ABNORMAL LOW (ref >39)
LDL Chol Calc (NIH): 117 mg/dL — ABNORMAL HIGH (ref 0–99)
Triglycerides: 109 mg/dL (ref 0–149)
VLDL Cholesterol Cal: 20 mg/dL (ref 5–40)

## 2023-01-01 LAB — HCV AB W REFLEX TO QUANT PCR: HCV Ab: NONREACTIVE

## 2023-01-01 LAB — BASIC METABOLIC PANEL
BUN/Creatinine Ratio: 11 (ref 10–24)
BUN: 14 mg/dL (ref 8–27)
CO2: 21 mmol/L (ref 20–29)
Calcium: 9.5 mg/dL (ref 8.6–10.2)
Chloride: 108 mmol/L — ABNORMAL HIGH (ref 96–106)
Creatinine, Ser: 1.33 mg/dL — ABNORMAL HIGH (ref 0.76–1.27)
Glucose: 161 mg/dL — ABNORMAL HIGH (ref 70–99)
Potassium: 4.2 mmol/L (ref 3.5–5.2)
Sodium: 141 mmol/L (ref 134–144)
eGFR: 61 mL/min/{1.73_m2} (ref >59)

## 2023-01-01 LAB — HCV INTERPRETATION

## 2023-01-02 DIAGNOSIS — R42 Dizziness and giddiness: Secondary | ICD-10-CM | POA: Insufficient documentation

## 2023-01-02 NOTE — Assessment & Plan Note (Signed)
Dizziness most likely due to over treatment of hypertension as patient is mildly hypotensive in clinic today. Unlikely due to anemia or blood loss. Unlikely due to electrolyte abnormalities. Patient could also have polypharmacy or orthostatic hypotension. Patient did not have any focal weakness on exam, unlikely neurologic disorder.  - BMP  - Decrease verapamil from 120 to 100 mg  - F/u in 4 weeks

## 2023-01-04 NOTE — Progress Notes (Addendum)
  HPI:  Patient presents today for a new patient appointment to establish general primary care.  Prior PCP: Berenice Bouton, last seen within the last year   Other care team members: Cardiology, Psychiatry, Endocrinology, Neurology, Psychiatry   Concerns today: No concerns though patient does endorse dizziness when asked. Denies dizziness at current moment but reports occasional lightheadedness. Denied palpitations or chest pain. Patient denied any falls. Patient denied any recent weight loss. Denies bleeding per rectum.   Past Medical Hx:  -CVA in 2019, TIA x 2 in 2020  - Cardiomypathy  - HTN  - T2DM on insulin  - Seizure disorder on medication  - CKD 3a  - Bipolar 1 disorder   Past Surgical Hx:  - Multiple cervical fusions C4-6 in 2007  - Epididymectomy 2014  - Testicle removal  - Cholecystectomy   Family Hx: updated in Epic  Social Hx:  His daughter passed away a year ago. Wife passed away a couple years before that. He has some sons that he does not communicate with.  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 - lives with: alone - tobacco: None - alcohol: None  PHYSICAL EXAM: BP 106/78   Pulse 97   Ht 5\' 7"  (1.702 m)   Wt 185 lb (83.9 kg)   SpO2 97%   BMI 28.98 kg/m  General: well appearing, in no acute distress CV: RRR, radial pulses equal and palpable, no BLE edema  Resp: Normal work of breathing on room air, CTAB Abd: Soft, non tender, non distended  Neuro: Alert & Oriented x 4, EOMI, PERRLA, no gait instability, romberg's negative  Skin: No mucosal pallor   ASSESSMENT/PLAN:  Health maintenance:  Discussed Shingles, Flu vaccine patient declined at this time. Patient says he has seen his ophthalmologist and will send [place] the records. Colonoscopy in 2021 - Hepatitis C screening test    Dizziness Dizziness most likely due to over treatment of hypertension as patient is mildly hypotensive in clinic today. Unlikely due to  anemia or blood loss. Unlikely due to electrolyte abnormalities. Patient could also have polypharmacy or orthostatic hypotension. Patient did not have any focal weakness on exam, unlikely neurologic disorder.  - BMP  - Decrease verapamil from 120 to 100 mg  - F/u in 4 weeks    Type 2 diabetes mellitus with diabetic neuropathy, with long-term current use of insulin and hyperglycemia -     POCT glycosylated hemoglobin (Hb A1C)  Cerebrovascular accident (CVA), unspecified mechanism ([place]) Cardiomyopathy  -     Lipid panel -     Basic metabolic panel  History of fusion of cervical spine -     Ambulatory referral to Physical Therapy  FOLLOW UP: Follow up in 4 weeks for blood pressure check   [name] MD [place]

## 2023-01-06 ENCOUNTER — Other Ambulatory Visit (HOSPITAL_BASED_OUTPATIENT_CLINIC_OR_DEPARTMENT_OTHER): Payer: Self-pay

## 2023-01-06 MED ORDER — OXYCODONE-ACETAMINOPHEN 10-325 MG PO TABS
1.0000 | ORAL_TABLET | Freq: Three times a day (TID) | ORAL | 0 refills | Status: DC
  Filled 2023-01-06: qty 90, 30d supply, fill #0

## 2023-01-14 ENCOUNTER — Telehealth: Payer: Self-pay | Admitting: Family Medicine

## 2023-01-14 ENCOUNTER — Ambulatory Visit: Payer: Medicare HMO | Admitting: Physical Therapy

## 2023-01-14 NOTE — Telephone Encounter (Signed)
Patient dropped off form at front desk for [place].  Verified that patient section of form has been completed.  Last DOS/WCC with PCP was 01/01/24.  Placed form in red team folder to be completed by clinical staff.  Creig Hines

## 2023-01-14 NOTE — Telephone Encounter (Signed)
Reviewed form and placed in PCP's box for completion.  .Adonai Selsor R Danyel Griess, CMA  

## 2023-01-15 ENCOUNTER — Other Ambulatory Visit: Payer: Self-pay | Admitting: Family Medicine

## 2023-01-15 DIAGNOSIS — K5909 Other constipation: Secondary | ICD-10-CM

## 2023-01-15 MED ORDER — LACTULOSE 10 GM/15ML PO SOLN: 30.0000 g | Freq: Every day | ORAL | 1 refills | Status: DC | PRN

## 2023-01-15 NOTE — Progress Notes (Signed)
Called patient regarding disability parking. Patient said he is disabled and with his cardiomyopathy cannot walk very far without getting dyspneic.

## 2023-01-15 NOTE — Telephone Encounter (Signed)
Patient called and informed that forms are ready for pick up. Copy made and placed in batch scanning. Original placed at front desk for pick up.   Murle Otting C Tattiana Fakhouri, RN  

## 2023-01-18 NOTE — Therapy (Signed)
OUTPATIENT PHYSICAL THERAPY CERVICAL EVALUATION   Patient Name: Nicholas [name] MRN: [id number] DOB:September 21, 1961, 62 y.o., male Today's Date: 01/19/2023  END OF SESSION:  PT End of Session - 01/19/23 1524     Visit Number 1    Number of Visits --   1-2x/week   Date for PT Re-Evaluation 03/16/23    Authorization Type [employer]    Authorization Time Period auth tbd    Progress Note Due on Visit 10    PT Start Time 1530    PT Stop Time 1624    PT Time Calculation (min) 54 min    Activity Tolerance Patient tolerated treatment well    Behavior During Therapy WFL for tasks assessed/performed             Past Medical History:  Diagnosis Date   Asthma    Bipolar 1 disorder ([place])    Borderline glaucoma    Chronic pain    Epididymal pain    LEFT   Epilepsy, 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29---  LAST SEIZURE 1 WK AGO (APPROX ,  10-31-2013)   NO NEUROLOGIST---  PT SEES PCP  DR Nicholas [name]   Feeling of incomplete bladder emptying    Frequency of urination    Gastric ulcer    GERD (gastroesophageal reflux disease)    Hypertension    Hyperthyroidism    NO MEDS    Migraine    Seizures ([place])    Stroke ([place])    TIA (transient ischemic attack)    Type 2 diabetes mellitus ([place])    Past Surgical History:  Procedure Laterality Date   ABDOMINAL SURGERY     ANTERIOR CERVICAL DECOMP/DISCECTOMY FUSION  2007   C4  --  C6   CERVICAL FUSION     CHOLECYSTECTOMY     COLONOSCOPY WITH PROPOFOL Left 11/02/2020   Procedure: COLONOSCOPY WITH PROPOFOL;  Surgeon: Nicholas Silence, MD;  Location: [place];  Service: Endoscopy;  Laterality: Left;   CYSTOSCOPY N/A 11/09/2013   Procedure: CYSTOSCOPY FLEXIBLE;  Surgeon: Nicholas Seal, MD;  Location[facility];  Service: Urology;  Laterality: N/A;   EPIDIDYMECTOMY Left 11/09/2013   Procedure:  LEFT EPIDIDYMECTOMY;  Surgeon: Nicholas Seal, MD;  Location: [facility];  Service: Urology;  Laterality: Left;  POSSIBLE OUTPATIENT WITH  OBSERVATION   EXCISION LIPOMA LEFT SHOULDER  2004 (APPROX)   MULTIPLE CYST REMOVED FROM CHEST  AGE 74   OTHER SURGICAL HISTORY     hemorroid surgery    TESTICLE REMOVAL Left    TONSILLECTOMY     Patient Active Problem List   Diagnosis Date Noted   Dizziness 01/02/2023   Bipolar 1 disorder ([place]) 07/01/2022   Hyperlipidemia 07/01/2022   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 A999333   Metabolic acidosis A999333   Palpitations 05/08/2022   Seizures ([place]) 12/03/2020   Rectal bleeding 10/31/2020   Cardiomyopathy ([place]) 09/23/2020   Atypical chest pain 09/23/2020   Precordial pain    CVA (cerebral vascular accident) ([place]) 09/02/2020   Seizure ([place]) 07/22/2020   Left-sided weakness 07/04/2020   Dysarthria 07/04/2020   Essential hypertension    Seizure disorder ([place]) 07/02/2017   Type 2 diabetes mellitus with diabetic neuropathy, with long-term current use of insulin and hyperglycemia 07/02/2017   Stage 3a chronic kidney disease (CKD) ([place]) 07/02/2017   Normocytic anemia 07/02/2017   Testicular/scrotal pain 11/09/2013   Microhematuria 11/09/2013   Condyloma acuminatum of scrotum 11/09/2013    PCP: Nicholas Ram, MD  REFERRING PROVIDER: Leeanne Rio, MD  REFERRING DIAG: Z98.1 (ICD-10-CM) - History of fusion of cervical spine M54.2 (ICD-10-CM) - Neck pain  THERAPY DIAG:  Cervicalgia  Abnormal posture  Muscle weakness (generalized)  Rationale for Evaluation and Treatment: Rehabilitation  ONSET DATE: ongoing for several years  SUBJECTIVE:                                                                                                                                                                                                         SUBJECTIVE STATEMENT: Pt reports hx of neck pain since early 2000s, 2 fusions (early 2000s, mid 2010s). States symptoms have been managed with pain management and medication, has had previous bouts of PT. States numbness  and weakness is more recent, starting a few weeks ago but overall pain is about the same. Pt endorses numbness/tingling in RUE down to hand (describes lateral arm down to radial nerve distribution), difficulty grasping objects that improves with shaking his hands out. Has spasms as well. Also endorses R back/LE pain that has been ongoing for a while. When pain is worst he has to lay in bed until symptoms subside. Has had previous PT for these symptoms which he states was helpful. States these symptoms feel similar to pre surgery symptoms.  Denies any difficulty with speech, no overt swallowing issues, no new bowel/bladder issues (chronic issues since bowel surgery, uses depends at baseline), no saddle anesthesia, no new headaches or visual changes  PERTINENT HISTORY:  HTN, CVA/TIA, cardiomyopathy, DM2, seizures, CKD3, bipolar 1, migraines, sleep apnea   PAIN:  Are you having pain: 5/10 Location/description: L neck/shoulder with some referral into UE, describes as fatigued and spasms. Numbness described in radial distribution Best-worst over past week: 0-10/10  Per eval -  - aggravating factors: turning head to R, stretching, sleeping, bending, lifting, twisting - Easing factors: medication, TENS, biofreeze  PRECAUTIONS: seizure precautions, cardiac hx, cervical fusions (C4-T1 per pt)  WEIGHT BEARING RESTRICTIONS: No  FALLS:  Has patient fallen in last 6 months? Yes - unsure how many - endorses some balance issues since CVA/TIAs   LIVING ENVIRONMENT: Lives alone in 1 bedroom, downstairs (15 steps vs elevator)  OCCUPATION: works part time at [employer] , lots of time up on his feet and occasional lifting (not heavy per pt)  PLOF: Independent  PATIENT GOALS: less pain and numbness, be able to relax more    NEXT MD VISIT: April 2024  OBJECTIVE:   DIAGNOSTIC FINDINGS:  No recent cervical imaging CTH/MRI head and MRI lumbar spine July 2023, see  epic for details  PATIENT SURVEYS:   FOTO 37 current, 52 predicted  COGNITION: Overall cognitive status: Within functional limits for tasks assessed although pt endorses mild memory issues since stroke  SENSATION/NEURO: Light touch intact all extremities but reduced on distal RLE, allodynia on C4-C7 (reports sensory changes after stroke and prior RLE injury) Finger<>nose testing mild incoordination RUE compared to L, no overt ataxia but intermittent dysmetria (pt attributes this to glaucoma issues) No clonus BIL Negative hoffmann and tromner sign BIL No ataxia with gait No aberrant eye movements or symptoms with saccades/smooth pursuits  POSTURE: significant forward head posture, trunk lean to L, increased L UT elevation  PALPATION: Concordant tenderness w/ trigger points R LS/UT, deltoid and biceps. Also able to resolve UE numbness w/ palpation of trigger point in R LS although returns immediately after cessation of pressure.    CERVICAL ROM:   Active ROM A/PROM (deg) eval  Flexion 50% *  Extension 75% s  Right lateral flexion   Left lateral flexion   Right rotation 40 deg *  Left rotation 60 deg    (Blank rows = not tested)  UPPER EXTREMITY ROM:  Active ROM Right eval Left eval  Shoulder flexion 110 * s  150 deg  Shoulder abduction    Shoulder internal rotation    Shoulder external rotation    Elbow flexion    Elbow extension    Wrist flexion    Wrist extension     (Blank rows = not tested) (Key: WFL = within functional limits not formally assessed, * = concordant pain, s = stiffness/stretching sensation, NT = not tested)  Comments:  UPPER EXTREMITY MMT:  MMT Right eval Left eval  Shoulder flexion    Shoulder extension    Shoulder abduction    Shoulder extension    Shoulder internal rotation    Shoulder external rotation    Elbow flexion    Elbow extension    Grip strength    (Blank rows = not tested)  (Key: WFL = within functional limits not formally assessed, * = concordant pain, s =  stiffness/stretching sensation, NT = not tested)  Comments: deferred on eval given symptom irritability  CERVICAL SPECIAL TESTS:  Spurlings: + on R, mild discomfort on L  Sharps Purser: negative  FUNCTIONAL TESTS:  Overhead reach limited as above  TODAY'S TREATMENT:                                                                                                                              [facility] Adult PT Treatment:                                                DATE: 01/19/23 Deferred on eval given time constraints  PATIENT EDUCATION:  Education details: Pt education on PT impairments, prognosis,  and POC. Informed consent. Rationale for interventions Person educated: Patient Education method: Explanation, Demonstration, Tactile cues, Verbal cues Education comprehension: verbalized understanding, returned demonstration, verbal cues required, tactile cues required, and needs further education    HOME EXERCISE PROGRAM: Deferred on eval given time constraints  ASSESSMENT:  CLINICAL IMPRESSION: Patient is a pleasant 62 y.o. gentleman who was seen today for physical therapy evaluation and treatment for chronic neck pain. Red flag questioning is overall reassuring although neuro exam is somewhat confounded by complex medical history (see above, includes CVA and multiple TIAs, reported crushing injury distal RLE several years ago). Pt notes these symptoms are similar to symptoms prior to last cervical fusion and are limiting his tolerance to daily and work activities. On exam pt demonstrates cervical and GH mobility deficits with corresponding muscular irritation of R LS/UT, deltoid and proximal biceps. RUE numbness able to be resolved w/ pressure on R LS trigger point but returns with cessation of pressure. Increased time w/ history and exam given complex medical hx and symptom irritability, subsequently deferred HEP to next visit. No adverse events, report of muscular fatigue on departure but no  overt increase in symptoms. Recommend skilled PT to address aforementioned deficits w/ aim of maximizing pt tolerance to daily activities. Pt departs today's session in no acute distress, all voiced questions/concerns addressed appropriately from PT perspective.    OBJECTIVE IMPAIRMENTS: decreased activity tolerance, decreased endurance, decreased mobility, decreased ROM, decreased strength, hypomobility, increased muscle spasms, impaired UE functional use, postural dysfunction, and pain.   ACTIVITY LIMITATIONS: carrying, lifting, bending, sleeping, and reach over head  PARTICIPATION LIMITATIONS: meal prep, cleaning, laundry, community activity, and occupation  PERSONAL FACTORS: Time since onset of injury/illness/exacerbation and 3+ comorbidities: HTN, CVA, cardiomyopathy, CKD3, DM2  are also affecting patient's functional outcome.   REHAB POTENTIAL: Fair given chronicity and comorbidities  CLINICAL DECISION MAKING: Evolving/moderate complexity  EVALUATION COMPLEXITY: Moderate   GOALS: Goals reviewed with patient? No given time constraints  SHORT TERM GOALS: Target date: 02/16/2023 Pt will demonstrate appropriate understanding and performance of initially prescribed HEP in order to facilitate improved independence with management of symptoms.  Baseline: HEP TBD Goal status: INITIAL   2. Pt will score greater than or equal to 45 on FOTO in order to demonstrate improved perception of function due to symptoms.  Baseline: 37  Goal status: INITIAL    LONG TERM GOALS: Target date: 03/16/2023 Pt will score 52 on FOTO in order to demonstrate improved perception of function due to symptoms. Baseline: 37 Goal status: INITIAL  2. Pt will demonstrate at least 55 degrees of active cervical rotation to R in order to demonstrate improved environmental awareness and safety with driving.  Baseline: see ROM chart above Goal status: INITIAL  3. Pt will demonstrate at least 140deg of active R shoulder  elevation in order to facilitate improved tolerance w/ household/work tasks Baseline: see ROM chart above Goal status: INITIAL   4. Pt will report/demonstrate ability to lift up to 10# with less than 2 point increase in resting pain on NPS in order to demonstrate improved tolerance to work activities. Baseline: NT due to symptom irritability Goal status: INITIAL    PLAN:  PT FREQUENCY: 1-2x/week  PT DURATION: 8 weeks  PLANNED INTERVENTIONS: Therapeutic exercises, Therapeutic activity, Neuromuscular re-education, Balance training, Gait training, Patient/Family education, Self Care, Joint mobilization, Vestibular training, Aquatic Therapy, Dry Needling, Electrical stimulation, Spinal mobilization, Cryotherapy, Moist heat, Taping, Manual therapy, and Re-evaluation  PLAN FOR NEXT SESSION: establish HEP, gentle cervical  mobility with emphasis on UT/LS. STM as indicated.    Leeroy Cha PT, DPT 01/19/2023 4:45 PM

## 2023-01-19 ENCOUNTER — Other Ambulatory Visit: Payer: Self-pay

## 2023-01-19 ENCOUNTER — Encounter: Payer: Self-pay | Admitting: Physical Therapy

## 2023-01-19 ENCOUNTER — Ambulatory Visit: Payer: Medicare HMO | Attending: Family Medicine | Admitting: Physical Therapy

## 2023-01-19 DIAGNOSIS — M542 Cervicalgia: Secondary | ICD-10-CM | POA: Insufficient documentation

## 2023-01-19 DIAGNOSIS — Z981 Arthrodesis status: Secondary | ICD-10-CM | POA: Diagnosis not present

## 2023-01-19 DIAGNOSIS — M6281 Muscle weakness (generalized): Secondary | ICD-10-CM | POA: Insufficient documentation

## 2023-01-19 DIAGNOSIS — R293 Abnormal posture: Secondary | ICD-10-CM | POA: Insufficient documentation

## 2023-01-22 ENCOUNTER — Ambulatory Visit: Payer: Worker's Compensation

## 2023-01-29 ENCOUNTER — Other Ambulatory Visit (HOSPITAL_BASED_OUTPATIENT_CLINIC_OR_DEPARTMENT_OTHER): Payer: Self-pay

## 2023-01-29 MED ORDER — OXYCODONE-ACETAMINOPHEN 10-325 MG PO TABS
1.0000 | ORAL_TABLET | Freq: Three times a day (TID) | ORAL | 0 refills | Status: DC
  Filled 2023-01-29 – 2023-02-03 (×5): qty 90, 30d supply, fill #0

## 2023-02-01 ENCOUNTER — Other Ambulatory Visit: Payer: Self-pay | Admitting: Family Medicine

## 2023-02-01 DIAGNOSIS — I10 Essential (primary) hypertension: Secondary | ICD-10-CM

## 2023-02-01 NOTE — Therapy (Incomplete)
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[place]) DX AGE 75---  LAST SEIZURE 1 WK AGO (APPROX ,  10-31-2013)   NO NEUROLOGIST---  PT SEES PCP  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   Feeling of incomplete bladder emptying    Frequency of urination    Gastric ulcer    GERD (gastroesophageal reflux disease)    Hypertension    Hyperthyroidism    NO MEDS    Migraine    Seizures ([place])    Stroke ([place])    TIA (transient ischemic attack)    Type 2 diabetes mellitus ([place])    Past Surgical History:  Procedure Laterality Date   ABDOMINAL SURGERY     ANTERIOR CERVICAL DECOMP/DISCECTOMY FUSION  2007   C4  --  C6   CERVICAL FUSION     CHOLECYSTECTOMY     COLONOSCOPY WITH PROPOFOL Left 11/02/2020   Procedure: COLONOSCOPY WITH PROPOFOL;  Surgeon: Arta Silence, MD;  Location: [place];  Service: Endoscopy;  Laterality: Left;   CYSTOSCOPY N/A 11/09/2013   Procedure: CYSTOSCOPY FLEXIBLE;  Surgeon: Irine Seal, MD;  Location: [facility];  Service: Urology;  Laterality: N/A;   EPIDIDYMECTOMY Left 11/09/2013   Procedure:  LEFT EPIDIDYMECTOMY;  Surgeon: Irine Seal, MD;  Location: [facility];  Service: Urology;  Laterality: Left;  POSSIBLE OUTPATIENT WITH OBSERVATION   EXCISION LIPOMA LEFT SHOULDER  2004 (APPROX)   MULTIPLE CYST REMOVED FROM CHEST  AGE 13   OTHER SURGICAL HISTORY     hemorroid surgery    TESTICLE REMOVAL Left    TONSILLECTOMY     Patient Active Problem List   Diagnosis Date Noted   Dizziness 01/02/2023   Bipolar 1 disorder ([place]) 07/01/2022   Hyperlipidemia 07/01/2022   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 A999333   Metabolic acidosis A999333   Palpitations 05/08/2022   Seizures ([place]) 12/03/2020   Rectal bleeding 10/31/2020   Cardiomyopathy ([place]) 09/23/2020   Atypical chest pain 09/23/2020   Precordial pain    CVA (cerebral vascular accident) ([place]) 09/02/2020   Seizure ([place]) 07/22/2020   Left-sided weakness 07/04/2020   Dysarthria 07/04/2020   Essential hypertension    Seizure disorder ([place]) 07/02/2017   Type 2 diabetes mellitus with diabetic neuropathy, with long-term current use of insulin and hyperglycemia 07/02/2017   Stage 3a chronic kidney disease (CKD) ([place]) 07/02/2017   Normocytic anemia 07/02/2017   Testicular/scrotal pain 11/09/2013   Microhematuria 11/09/2013   Condyloma acuminatum of scrotum 11/09/2013    REFERRING DIAG: Z98.1 (ICD-10-CM) - History of fusion of cervical spine M54.2 (ICD-10-CM) - Neck pain  THERAPY DIAG:  No diagnosis found.  Rationale for Evaluation and Treatment Rehabilitation  PERTINENT HISTORY: HTN, CVA/TIA, cardiomyopathy, DM2, seizures, CKD3, bipolar 1, migraines, sleep apnea    PRECAUTIONS: seizure precautions, cardiac hx, cervical fusions (C4-T1 per pt), fall risk  SUBJECTIVE:  SUBJECTIVE STATEMENT:  ***   PAIN:  Are you having pain: 5/10 Location/description: L neck/shoulder with some referral into UE, describes as fatigued and spasms. Numbness described in radial distribution Best-worst over past week: 0-10/10  Per eval -  - aggravating factors: turning head to R, stretching, sleeping, bending, lifting, twisting - Easing factors: medication, TENS, biofreeze   OBJECTIVE: (objective measures completed at initial evaluation unless otherwise dated)   DIAGNOSTIC FINDINGS:  No recent cervical imaging CTH/MRI head and MRI lumbar  spine July 2023, see epic for details   PATIENT SURVEYS:  FOTO 37 current, 52 predicted   COGNITION: Overall cognitive status: Within functional limits for tasks assessed although pt endorses mild memory issues since stroke   SENSATION/NEURO: Light touch intact all extremities but reduced on distal RLE, allodynia on C4-C7 (reports sensory changes after stroke and prior RLE injury) Finger<>nose testing mild incoordination RUE compared to L, no overt ataxia but intermittent dysmetria (pt attributes this to glaucoma issues) No clonus BIL Negative hoffmann and tromner sign BIL No ataxia with gait No aberrant eye movements or symptoms with saccades/smooth pursuits   POSTURE: significant forward head posture, trunk lean to L, increased L UT elevation   PALPATION: Concordant tenderness w/ trigger points R LS/UT, deltoid and biceps. Also able to resolve UE numbness w/ palpation of trigger point in R LS although returns immediately after cessation of pressure.              CERVICAL ROM:    Active ROM A/PROM (deg) eval  Flexion 50% *  Extension 75% s  Right lateral flexion    Left lateral flexion    Right rotation 40 deg *  Left rotation 60 deg    (Blank rows = not tested)   UPPER EXTREMITY ROM:   Active ROM Right eval Left eval  Shoulder flexion 110 * s  150 deg  Shoulder abduction      Shoulder internal rotation      Shoulder external rotation      Elbow flexion      Elbow extension      Wrist flexion      Wrist extension       (Blank rows = not tested) (Key: WFL = within functional limits not formally assessed, * = concordant pain, s = stiffness/stretching sensation, NT = not tested)  Comments:   UPPER EXTREMITY MMT:   MMT Right eval Left eval  Shoulder flexion      Shoulder extension      Shoulder abduction      Shoulder extension      Shoulder internal rotation      Shoulder external rotation      Elbow flexion      Elbow extension      Grip strength       (Blank rows = not tested)  (Key: WFL = within functional limits not formally assessed, * = concordant pain, s = stiffness/stretching sensation, NT = not tested)  Comments: deferred on eval given symptom irritability   CERVICAL SPECIAL TESTS:  Spurlings: + on R, mild discomfort on L  Sharps Purser: negative   FUNCTIONAL TESTS:  Overhead reach limited as above   TODAY'S TREATMENT:  [facility] Adult PT Treatment:                                                DATE: 02/02/23 Therapeutic Exercise: *** Manual Therapy: *** Neuromuscular re-ed: *** Therapeutic Activity: *** Modalities: *** Self Care: ***    PATIENT EDUCATION:  Education details: rationale for interventions, safety w/ activity Person educated: Patient Education method: Explanation, Demonstration, Tactile cues, Verbal cues Education comprehension: verbalized understanding, returned demonstration, verbal cues required, tactile cues required, and needs further education     HOME EXERCISE PROGRAM: Deferred on eval given time constraints   ASSESSMENT:   CLINICAL IMPRESSION: 02/01/2023 ***   Eval - Patient is a pleasant 62 y.o. gentleman who was seen today for physical therapy evaluation and treatment for chronic neck pain. Red flag questioning is overall reassuring although neuro exam is somewhat confounded by complex medical history (see above, includes CVA and multiple TIAs, reported crushing injury distal RLE several years ago). Pt notes these symptoms are similar to symptoms prior to last cervical fusion and are limiting his tolerance to daily and work activities. On exam pt demonstrates cervical and GH mobility deficits with corresponding muscular irritation of R LS/UT, deltoid and proximal biceps. RUE numbness able to be resolved w/ pressure on R LS trigger point but returns with cessation of pressure. Increased time w/ history and  exam given complex medical hx and symptom irritability, subsequently deferred HEP to next visit. No adverse events, report of muscular fatigue on departure but no overt increase in symptoms. Recommend skilled PT to address aforementioned deficits w/ aim of maximizing pt tolerance to daily activities. Pt departs today's session in no acute distress, all voiced questions/concerns addressed appropriately from PT perspective.     OBJECTIVE IMPAIRMENTS: decreased activity tolerance, decreased endurance, decreased mobility, decreased ROM, decreased strength, hypomobility, increased muscle spasms, impaired UE functional use, postural dysfunction, and pain.    ACTIVITY LIMITATIONS: carrying, lifting, bending, sleeping, and reach over head   PARTICIPATION LIMITATIONS: meal prep, cleaning, laundry, community activity, and occupation   PERSONAL FACTORS: Time since onset of injury/illness/exacerbation and 3+ comorbidities: HTN, CVA, cardiomyopathy, CKD3, DM2  are also affecting patient's functional outcome.    REHAB POTENTIAL: Fair given chronicity and comorbidities   CLINICAL DECISION MAKING: Evolving/moderate complexity   EVALUATION COMPLEXITY: Moderate     GOALS: Goals reviewed with patient? No given time constraints   SHORT TERM GOALS: Target date: 02/16/2023 Pt will demonstrate appropriate understanding and performance of initially prescribed HEP in order to facilitate improved independence with management of symptoms.  Baseline: HEP TBD Goal status: INITIAL    2. Pt will score greater than or equal to 45 on FOTO in order to demonstrate improved perception of function due to symptoms.            Baseline: 37            Goal status: INITIAL     LONG TERM GOALS: Target date: 03/16/2023 Pt will score 52 on FOTO in order to demonstrate improved perception of function due to symptoms. Baseline: 37 Goal status: INITIAL   2. Pt will demonstrate at least 55 degrees of active cervical rotation to R in  order to demonstrate improved environmental awareness and safety with driving.  Baseline: see ROM chart above Goal status: INITIAL   3. Pt will demonstrate at least 140deg of active  R shoulder elevation in order to facilitate improved tolerance w/ household/work tasks Baseline: see ROM chart above Goal status: INITIAL    4. Pt will report/demonstrate ability to lift up to 10# with less than 2 point increase in resting pain on NPS in order to demonstrate improved tolerance to work activities. Baseline: NT due to symptom irritability Goal status: INITIAL      PLAN:   PT FREQUENCY: 1-2x/week   PT DURATION: 8 weeks   PLANNED INTERVENTIONS: Therapeutic exercises, Therapeutic activity, Neuromuscular re-education, Balance training, Gait training, Patient/Family education, Self Care, Joint mobilization, Vestibular training, Aquatic Therapy, Dry Needling, Electrical stimulation, Spinal mobilization, Cryotherapy, Moist heat, Taping, Manual therapy, and Re-evaluation   PLAN FOR NEXT SESSION: establish HEP, gentle cervical mobility with emphasis on UT/LS. STM as indicated.    Leeroy Cha PT, DPT 02/01/2023 11:35 AM

## 2023-02-02 ENCOUNTER — Ambulatory Visit: Payer: Medicare HMO | Admitting: Physical Therapy

## 2023-02-03 ENCOUNTER — Other Ambulatory Visit (HOSPITAL_BASED_OUTPATIENT_CLINIC_OR_DEPARTMENT_OTHER): Payer: Self-pay

## 2023-02-03 NOTE — Therapy (Signed)
OUTPATIENT PHYSICAL THERAPY TREATMENT NOTE   Patient Name: Nicholas [name] MRN: [id number] DOB:12-Nov-1961, 62 y.o., male Today's Date: 02/04/2023  PCP: [name], MD   REFERRING PROVIDER: Leeanne Rio, MD  END OF SESSION:   PT End of Session - 02/04/23 1502     Visit Number 2    Number of Visits --   1-2x/week   Date for PT Re-Evaluation 03/16/23    Authorization Type [employer] medic[employer]    Authorization Time Period 12 visits 01/25/23-03/13/23    Authorization - Visit Number 1    Authorization - Number of Visits 12    Progress Note Due on Visit 10    PT Start Time 1502    PT Stop Time 1541    PT Time Calculation (min) 39 min    Activity Tolerance Patient limited by pain;No increased pain    Behavior During Therapy WFL for tasks assessed/performed             Past Medical History:  Diagnosis Date   Asthma    Bipolar 1 disorder ([place])    Borderline glaucoma    Chronic pain    Epididymal pain    LEFT   Epilepsy, 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69---  LAST SEIZURE 1 WK AGO (APPROX ,  10-31-2013)   NO NEUROLOGIST---  PT SEES PCP  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   Feeling of incomplete bladder emptying    Frequency of urination    Gastric ulcer    GERD (gastroesophageal reflux disease)    Hypertension    Hyperthyroidism    NO MEDS    Migraine    Seizures (HCC)    Stroke ([place])    TIA (transient ischemic attack)    Type 2 diabetes mellitus ([place])    Past Surgical History:  Procedure Laterality Date   ABDOMINAL SURGERY     ANTERIOR CERVICAL DECOMP/DISCECTOMY FUSION  2007   C4  --  C6   CERVICAL FUSION     CHOLECYSTECTOMY     COLONOSCOPY WITH PROPOFOL Left 11/02/2020   Procedure: COLONOSCOPY WITH PROPOFOL;  Surgeon: Arta Silence, MD;  Location: [place];  Service: Endoscopy;  Laterality: Left;   CYSTOSCOPY N/A 11/09/2013   Procedure: CYSTOSCOPY FLEXIBLE;  Surgeon: Irine Seal, MD;  Location: [facility];  Service: Urology;  Laterality: N/A;   EPIDIDYMECTOMY Left  11/09/2013   Procedure:  LEFT EPIDIDYMECTOMY;  Surgeon: Irine Seal, MD;  Location: [facility];  Service: Urology;  Laterality: Left;  POSSIBLE OUTPATIENT WITH OBSERVATION   EXCISION LIPOMA LEFT SHOULDER  2004 (APPROX)   MULTIPLE CYST REMOVED FROM CHEST  AGE 57   OTHER SURGICAL HISTORY     hemorroid surgery    TESTICLE REMOVAL Left    TONSILLECTOMY     Patient Active Problem List   Diagnosis Date Noted   Dizziness 01/02/2023   Bipolar 1 disorder ([place]) 07/01/2022   Hyperlipidemia 07/01/2022   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 [id number]   Metabolic acidosis A999333   Palpitations 05/08/2022   Seizures ([place]) 12/03/2020   Rectal bleeding 10/31/2020   Cardiomyopathy ([place]) 09/23/2020   Atypical chest pain 09/23/2020   Precordial pain    CVA (cerebral vascular accident) ([place]) 09/02/2020   Seizure ([place]) 07/22/2020   Left-sided weakness 07/04/2020   Dysarthria 07/04/2020   Essential hypertension    Seizure disorder ([place]) 07/02/2017   Type 2 diabetes mellitus with diabetic neuropathy, with long-term current use of insulin and hyperglycemia 07/02/2017   Stage 3a  chronic kidney disease (CKD) ([place]) 07/02/2017   Normocytic anemia 07/02/2017   Testicular/scrotal pain 11/09/2013   Microhematuria 11/09/2013   Condyloma acuminatum of scrotum 11/09/2013    REFERRING DIAG: Z98.1 (ICD-10-CM) - History of fusion of cervical spine M54.2 (ICD-10-CM) - Neck pain  THERAPY DIAG:  Cervicalgia  Abnormal posture  Muscle weakness (generalized)  Rationale for Evaluation and Treatment Rehabilitation  PERTINENT HISTORY: HTN, CVA/TIA, cardiomyopathy, DM2, seizures, CKD3, bipolar 1, migraines, sleep apnea    PRECAUTIONS: seizure precautions, cardiac hx, cervical fusions (C4-T1 per pt), fall risk  SUBJECTIVE:                                                                                                                                                                                       SUBJECTIVE STATEMENT:   Pt states that his neck has been bothering him a lot more since yesterday, denies any precipitating factors. Thinks it may be due to weather. No issues after initial eval  PAIN:  Are you having pain: 9/10 Location/description: L neck/shoulder with some referral into UE, describes as fatigued and spasms. Numbness described in radial distribution Best-worst over past week: 0-10/10  Per eval -  - aggravating factors: turning head to R, stretching, sleeping, bending, lifting, twisting - Easing factors: medication, TENS, biofreeze   OBJECTIVE: (objective measures completed at initial evaluation unless otherwise dated)   DIAGNOSTIC FINDINGS:  No recent cervical imaging CTH/MRI head and MRI lumbar spine July 2023, see epic for details   PATIENT SURVEYS:  FOTO 37 current, 52 predicted   COGNITION: Overall cognitive status: Within functional limits for tasks assessed although pt endorses mild memory issues since stroke   SENSATION/NEURO: Light touch intact all extremities but reduced on distal RLE, allodynia on C4-C7 (reports sensory changes after stroke and prior RLE injury) Finger<>nose testing mild incoordination RUE compared to L, no overt ataxia but intermittent dysmetria (pt attributes this to glaucoma issues) No clonus BIL Negative hoffmann and tromner sign BIL No ataxia with gait No aberrant eye movements or symptoms with saccades/smooth pursuits   POSTURE: significant forward head posture, trunk lean to L, increased L UT elevation   PALPATION: Concordant tenderness w/ trigger points R LS/UT, deltoid and biceps. Also able to resolve UE numbness w/ palpation of trigger point in R LS although returns immediately after cessation of pressure.              CERVICAL ROM:    Active ROM A/PROM (deg) eval  Flexion 50% *  Extension 75% s  Right lateral flexion    Left lateral flexion    Right rotation 40 deg *  Left rotation 60 deg     (  Blank rows = not tested)   UPPER EXTREMITY ROM:   Active ROM Right eval Left eval  Shoulder flexion 110 * s  150 deg  Shoulder abduction      Shoulder internal rotation      Shoulder external rotation      Elbow flexion      Elbow extension      Wrist flexion      Wrist extension       (Blank rows = not tested) (Key: WFL = within functional limits not formally assessed, * = concordant pain, s = stiffness/stretching sensation, NT = not tested)  Comments:   UPPER EXTREMITY MMT:   MMT Right eval Left eval  Shoulder flexion      Shoulder extension      Shoulder abduction      Shoulder extension      Shoulder internal rotation      Shoulder external rotation      Elbow flexion      Elbow extension      Grip strength      (Blank rows = not tested)  (Key: WFL = within functional limits not formally assessed, * = concordant pain, s = stiffness/stretching sensation, NT = not tested)  Comments: deferred on eval given symptom irritability   CERVICAL SPECIAL TESTS:  Spurlings: + on R, mild discomfort on L  Sharps Purser: negative   FUNCTIONAL TESTS:  Overhead reach limited as above   TODAY'S TREATMENT:                                                                                                [facility] Adult PT Treatment:                                                DATE: 02/04/23 Therapeutic Exercise: Radial nerve glides, seated x10  LS stretch towards L 3x30sec seated cues for setup and comfortable ROM  UT stretch towards L 2x30sec seated cues for comfortable ROM Cervical flexion stretch 2x30sec seated cues for comfortable ROM Scapular retraction x8 cues for reduced compensations, comfortable ROM  Manual Therapy: Very gentle STM R posterior shoulder, deltoid, tricep; seated STM and gentle trigger point release R LS, UT, and rhomboid; seated    PATIENT EDUCATION:  Education details: rationale for interventions, HEP Person educated: Patient Education method:  Explanation, Demonstration, Tactile cues, Verbal cues Education comprehension: verbalized understanding, returned demonstration, verbal cues required, tactile cues required, and needs further education     HOME EXERCISE PROGRAM: Access Code: [email or web address] URL: [email or web address] Date: 02/04/2023 Prepared by: Enis Slipper  Exercises - Seated Scapular Retraction  - 1 x daily - 7 x weekly - 2 sets - 8 reps - Gentle Levator Scapulae Stretch  - 1 x daily - 7 x weekly - 1 sets - 3 reps - 30sec hold   ASSESSMENT:   CLINICAL IMPRESSION: 02/04/2023 Pt arrives w/ 9/10 pain on NPS, states symptoms have been worse since yesterday which he  attributes to the weather. RUE/shoulder symptoms do not initially respond to manual, although with incorporation of periscapular/cervical musculature pt reports significant relief. Pt agreeable to trial of above exercises although he does voice some apprehension due to high pain levels on arrival - no increase in symptoms with activity as performed above, HEP provided (encouraged to limit B performance for LS stretch as trying towards R is provocative today). Pt reports 6/10 pain on departure, no adverse events or increase in UE symptoms. Recommend continuing along current POC to address relevant MSK deficits. Pt departs today's session in no acute distress, all voiced questions/concerns addressed appropriately from PT perspective.    Eval - Patient is a pleasant 62 y.o. gentleman who was seen today for physical therapy evaluation and treatment for chronic neck pain. Red flag questioning is overall reassuring although neuro exam is somewhat confounded by complex medical history (see above, includes CVA and multiple TIAs, reported crushing injury distal RLE several years ago). Pt notes these symptoms are similar to symptoms prior to last cervical fusion and are limiting his tolerance to daily and work activities. On exam pt demonstrates cervical and GH mobility  deficits with corresponding muscular irritation of R LS/UT, deltoid and proximal biceps. RUE numbness able to be resolved w/ pressure on R LS trigger point but returns with cessation of pressure. Increased time w/ history and exam given complex medical hx and symptom irritability, subsequently deferred HEP to next visit. No adverse events, report of muscular fatigue on departure but no overt increase in symptoms. Recommend skilled PT to address aforementioned deficits w/ aim of maximizing pt tolerance to daily activities. Pt departs today's session in no acute distress, all voiced questions/concerns addressed appropriately from PT perspective.     OBJECTIVE IMPAIRMENTS: decreased activity tolerance, decreased endurance, decreased mobility, decreased ROM, decreased strength, hypomobility, increased muscle spasms, impaired UE functional use, postural dysfunction, and pain.    ACTIVITY LIMITATIONS: carrying, lifting, bending, sleeping, and reach over head   PARTICIPATION LIMITATIONS: meal prep, cleaning, laundry, community activity, and occupation   PERSONAL FACTORS: Time since onset of injury/illness/exacerbation and 3+ comorbidities: HTN, CVA, cardiomyopathy, CKD3, DM2  are also affecting patient's functional outcome.    REHAB POTENTIAL: Fair given chronicity and comorbidities   CLINICAL DECISION MAKING: Evolving/moderate complexity   EVALUATION COMPLEXITY: Moderate     GOALS: Goals reviewed with patient? No given time constraints   SHORT TERM GOALS: Target date: 02/16/2023 Pt will demonstrate appropriate understanding and performance of initially prescribed HEP in order to facilitate improved independence with management of symptoms.  Baseline: HEP TBD Goal status: INITIAL    2. Pt will score greater than or equal to 45 on FOTO in order to demonstrate improved perception of function due to symptoms.            Baseline: 37            Goal status: INITIAL     LONG TERM GOALS: Target date:  03/16/2023 Pt will score 52 on FOTO in order to demonstrate improved perception of function due to symptoms. Baseline: 37 Goal status: INITIAL   2. Pt will demonstrate at least 55 degrees of active cervical rotation to R in order to demonstrate improved environmental awareness and safety with driving.  Baseline: see ROM chart above Goal status: INITIAL   3. Pt will demonstrate at least 140deg of active R shoulder elevation in order to facilitate improved tolerance w/ household/work tasks Baseline: see ROM chart above Goal status: INITIAL  4. Pt will report/demonstrate ability to lift up to 10# with less than 2 point increase in resting pain on NPS in order to demonstrate improved tolerance to work activities. Baseline: NT due to symptom irritability Goal status: INITIAL      PLAN:   PT FREQUENCY: 1-2x/week   PT DURATION: 8 weeks   PLANNED INTERVENTIONS: Therapeutic exercises, Therapeutic activity, Neuromuscular re-education, Balance training, Gait training, Patient/Family education, Self Care, Joint mobilization, Vestibular training, Aquatic Therapy, Dry Needling, Electrical stimulation, Spinal mobilization, Cryotherapy, Moist heat, Taping, Manual therapy, and Re-evaluation   PLAN FOR NEXT SESSION: Manual PRN as indicated. Gentle cervical mobility, cervical/periscapular activation exercises as able/appropriate     Leeroy Cha PT, DPT 02/04/2023 4:04 PM

## 2023-02-04 ENCOUNTER — Encounter: Payer: Self-pay | Admitting: Physical Therapy

## 2023-02-04 ENCOUNTER — Ambulatory Visit: Payer: Medicare HMO | Admitting: Physical Therapy

## 2023-02-04 DIAGNOSIS — R293 Abnormal posture: Secondary | ICD-10-CM | POA: Diagnosis not present

## 2023-02-04 DIAGNOSIS — M542 Cervicalgia: Secondary | ICD-10-CM | POA: Diagnosis not present

## 2023-02-04 DIAGNOSIS — M6281 Muscle weakness (generalized): Secondary | ICD-10-CM | POA: Diagnosis not present

## 2023-02-04 DIAGNOSIS — Z981 Arthrodesis status: Secondary | ICD-10-CM | POA: Diagnosis not present

## 2023-02-09 ENCOUNTER — Other Ambulatory Visit: Payer: Self-pay

## 2023-02-09 ENCOUNTER — Encounter: Payer: Self-pay | Admitting: Physical Therapy

## 2023-02-09 ENCOUNTER — Ambulatory Visit: Payer: Medicare HMO | Attending: Family Medicine | Admitting: Physical Therapy

## 2023-02-09 DIAGNOSIS — R293 Abnormal posture: Secondary | ICD-10-CM | POA: Insufficient documentation

## 2023-02-09 DIAGNOSIS — M6281 Muscle weakness (generalized): Secondary | ICD-10-CM | POA: Insufficient documentation

## 2023-02-09 DIAGNOSIS — M542 Cervicalgia: Secondary | ICD-10-CM | POA: Insufficient documentation

## 2023-02-09 NOTE — Patient Instructions (Signed)

## 2023-02-09 NOTE — Therapy (Signed)
OUTPATIENT PHYSICAL THERAPY TREATMENT NOTE   Patient Name: Nicholas [name] MRN: [id number] DOB:[birth year], 62 y.o., male Today's Date: 02/09/2023  PCP: [name], MD   REFERRING PROVIDER: Leeanne Rio, MD  END OF SESSION:   PT End of Session - 02/09/23 1506     Visit Number 3    Date for PT Re-Evaluation 03/16/23    Authorization Type human[employer] medicare    Authorization Time Period 12 visits 01/25/23-03/13/23    Authorization - Visit Number 3    Authorization - Number of Visits 12    Progress Note Due on Visit 10    PT Start Time 1502    PT Stop Time 1545    PT Time Calculation (min) 43 min    Activity Tolerance Patient limited by pain;No increased pain    Behavior During Therapy WFL for tasks assessed/performed              Past Medical History:  Diagnosis Date   Asthma    Bipolar 1 disorder ([place])    Borderline glaucoma    Chronic pain    Epididymal pain    LEFT   Epilepsy, 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3---  LAST SEIZURE 1 WK AGO (APPROX ,  10-31-2013)   NO NEUROLOGIST---  PT SEES PCP  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   Feeling of incomplete bladder emptying    Frequency of urination    Gastric ulcer    GERD (gastroesophageal reflux disease)    Hypertension    Hyperthyroidism    NO MEDS    Migraine    Seizures (HCC)    Stroke ([place])    TIA (transient ischemic attack)    Type 2 diabetes mellitus ([place])    Past Surgical History:  Procedure Laterality Date   ABDOMINAL SURGERY     ANTERIOR CERVICAL DECOMP/DISCECTOMY FUSION  2007   C4  --  C6   CERVICAL FUSION     CHOLECYSTECTOMY     COLONOSCOPY WITH PROPOFOL Left 11/02/2020   Procedure: COLONOSCOPY WITH PROPOFOL;  Surgeon: Arta Silence, MD;  Location: [facility];  Service: Endoscopy;  Laterality: Left;   CYSTOSCOPY N/A 11/09/2013   Procedure: CYSTOSCOPY FLEXIBLE;  Surgeon: Irine Seal, MD;  Location: [facility];  Service: Urology;  Laterality: N/A;   EPIDIDYMECTOMY Left 11/09/2013   Procedure:  LEFT  EPIDIDYMECTOMY;  Surgeon: Irine Seal, MD;  Location: [facility];  Service: Urology;  Laterality: Left;  POSSIBLE OUTPATIENT WITH OBSERVATION   EXCISION LIPOMA LEFT SHOULDER  2004 (APPROX)   MULTIPLE CYST REMOVED FROM CHEST  AGE 56   OTHER SURGICAL HISTORY     hemorroid surgery    TESTICLE REMOVAL Left    TONSILLECTOMY     Patient Active Problem List   Diagnosis Date Noted   Dizziness 01/02/2023   Bipolar 1 disorder ([place]) 07/01/2022   Hyperlipidemia 07/01/2022   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 [id number]   Metabolic acidosis A999333   Palpitations 05/08/2022   Seizures ([place]) 12/03/2020   Rectal bleeding 10/31/2020   Cardiomyopathy ([place]) 09/23/2020   Atypical chest pain 09/23/2020   Precordial pain    CVA (cerebral vascular accident) ([place]) 09/02/2020   Seizure ([place]) 07/22/2020   Left-sided weakness 07/04/2020   Dysarthria 07/04/2020   Essential hypertension    Seizure disorder ([place]) 07/02/2017   Type 2 diabetes mellitus with diabetic neuropathy, with long-term current use of insulin and hyperglycemia 07/02/2017   Stage 3a chronic kidney disease (CKD) ([place]) 07/02/2017  Normocytic anemia 07/02/2017   Testicular/scrotal pain 11/09/2013   Microhematuria 11/09/2013   Condyloma acuminatum of scrotum 11/09/2013    REFERRING DIAG: Z98.1 (ICD-10-CM) - History of fusion of cervical spine M54.2 (ICD-10-CM) - Neck pain  THERAPY DIAG:  Cervicalgia  Abnormal posture  Muscle weakness (generalized)  Rationale for Evaluation and Treatment Rehabilitation  PERTINENT HISTORY: HTN, CVA/TIA, cardiomyopathy, DM2, seizures, CKD3, bipolar 1, migraines, sleep apnea    PRECAUTIONS: seizure precautions, cardiac hx, cervical fusions (C4-T1 per pt), fall risk  SUBJECTIVE:                                                                                                                                                                                      SUBJECTIVE  STATEMENT:   02-09-23 I am a 8/10. No difference in my pain since I started.  When I cough and jerk my arm,it is like pin and needles in my RT arm/shld and neck.  I cannot lie on my RT  PAIN:  Are you having pain: 9/10 Location/description: L neck/shoulder with some referral into UE, describes as fatigued and spasms. Numbness described in radial distribution Best-worst over past week: 0-10/10  Per eval -  - aggravating factors: turning head to R, stretching, sleeping, bending, lifting, twisting - Easing factors: medication, TENS, biofreeze   OBJECTIVE: (objective measures completed at initial evaluation unless otherwise dated)   DIAGNOSTIC FINDINGS:  No recent cervical imaging CTH/MRI head and MRI lumbar spine July 2023, see epic for details   PATIENT SURVEYS:  FOTO 37 current, 52 predicted   COGNITION: Overall cognitive status: Within functional limits for tasks assessed although pt endorses mild memory issues since stroke   SENSATION/NEURO: Light touch intact all extremities but reduced on distal RLE, allodynia on C4-C7 (reports sensory changes after stroke and prior RLE injury) Finger<>nose testing mild incoordination RUE compared to L, no overt ataxia but intermittent dysmetria (pt attributes this to glaucoma issues) No clonus BIL Negative hoffmann and tromner sign BIL No ataxia with gait No aberrant eye movements or symptoms with saccades/smooth pursuits   POSTURE: significant forward head posture, trunk lean to L, increased L UT elevation   PALPATION: Concordant tenderness w/ trigger points R LS/UT, deltoid and biceps. Also able to resolve UE numbness w/ palpation of trigger point in R LS although returns immediately after cessation of pressure.              CERVICAL ROM:    Active ROM A/PROM (deg) eval  Flexion 50% *  Extension 75% s  Right lateral flexion    Left lateral flexion    Right rotation 40 deg *  Left rotation 60 deg    (  Blank rows = not tested)    UPPER EXTREMITY ROM:   Active ROM Right eval Left eval  Shoulder flexion 110 * s  150 deg  Shoulder abduction      Shoulder internal rotation      Shoulder external rotation      Elbow flexion      Elbow extension      Wrist flexion      Wrist extension       (Blank rows = not tested) (Key: WFL = within functional limits not formally assessed, * = concordant pain, s = stiffness/stretching sensation, NT = not tested)  Comments:   UPPER EXTREMITY MMT:   MMT Right eval Left eval  Shoulder flexion      Shoulder extension      Shoulder abduction      Shoulder extension      Shoulder internal rotation      Shoulder external rotation      Elbow flexion      Elbow extension      Grip strength      (Blank rows = not tested)  (Key: WFL = within functional limits not formally assessed, * = concordant pain, s = stiffness/stretching sensation, NT = not tested)  Comments: deferred on eval given symptom irritability   CERVICAL SPECIAL TESTS:  Spurlings: + on R, mild discomfort on L  Sharps Purser: negative   FUNCTIONAL TESTS:  Overhead reach limited as above   TODAY'S TREATMENT:   [facility] Adult PT Treatment:                                                DATE: 02-09-23 Therapeutic Exercise: Radial nerve glides, supine x10  no pain post TPDN Deep neck flexor stretch with towel roll and chin tuck 10 sec hold x 10 with VC and TC Seated Scapular Retraction  x 10 with VC  Levator  Stretch  reps - 30sec hold x 2 Shoulder ER with scapular retraction-  2 x 10 reps - Scapular Retraction with RTB 10 reps - Shoulder Extension with RTB Palms Forward  - 10 reps Manual Therapy:  STW R posterior shoulder, deltoid, tricep supine STM and gentle trigger point release R Levator UT,  Trigger Point Dry-Needling performed 02-09-23  by Voncille Lo Treatment instructions: Expect mild to moderate muscle soreness. S/S of pneumothorax if dry needled over a lung field, and to seek immediate medical  attention should they occur. Patient verbalized understanding of these instructions and education.  Patient Consent Given: Yes Education handout provided: Previously provided Muscles treated:  RT upper trap and RT levator Electrical stimulation performed: No Parameters: N/A Treatment response/outcome: twitch response noted, pt noted relief                                                                                     [facility] Adult PT Treatment:  DATE: 02/04/23 Therapeutic Exercise: Radial nerve glides, seated x10  LS stretch towards L 3x30sec seated cues for setup and comfortable ROM  UT stretch towards L 2x30sec seated cues for comfortable ROM Cervical flexion stretch 2x30sec seated cues for comfortable ROM Scapular retraction x8 cues for reduced compensations, comfortable ROM  Manual Therapy: Very gentle STM R posterior shoulder, deltoid, tricep; seated STM and gentle trigger point release R LS, UT, and rhomboid; seated    PATIENT EDUCATION:  Education details: rationale for interventions, HEP Person educated: Patient Education method: Explanation, Demonstration, Tactile cues, Verbal cues Education comprehension: verbalized understanding, returned demonstration, verbal cues required, tactile cues required, and needs further education     HOME EXERCISE PROGRAM: Access Code: [email or web address] URL: [email or web address] Date: 02/09/2023 Prepared by: Voncille Lo  Exercises - Supine Cervical Retraction with Towel  - 1 x daily - 7 x weekly - 1 sets - 10 reps - Seated Scapular Retraction  - 1 x daily - 7 x weekly - 2 sets - 8 reps - Gentle Levator Scapulae Stretch  - 1 x daily - 7 x weekly - 1 sets - 3 reps - 30sec hold - Shoulder External Rotation and Scapular Retraction with Resistance  - 2 x daily - 7 x weekly - 3 sets - 10 reps - Scapular Retraction with Resistance  - 2 x daily - 7 x weekly - 3 sets - 10 reps - Shoulder  Extension with Resistance - Palms Forward  - 1 x daily - 7 x weekly - 3 sets - 10 reps   ASSESSMENT:   CLINICAL IMPRESSION: 02/09/2023  Pt arrives today with 8/10 pain today and consents to TPDN and is closely monitored throughout session.  Pt was initially hesitant but then was able to decrease pain to 6/10 at end  of session.  Pt was able to perform more challenging exercise with updated PT today after TPDN. Pt departs today's session in no acute distress, all voiced questions/concerns addressed appropriately from PT perspective.      Pt arrives w/ 9/10 pain on NPS, states symptoms have been worse since yesterday which he attributes to the weather. RUE/shoulder symptoms do not initially respond to manual, although with incorporation of periscapular/cervical musculature pt reports significant relief. Pt agreeable to trial of above exercises although he does voice some apprehension due to high pain levels on arrival - no increase in symptoms with activity as performed above, HEP provided (encouraged to limit B performance for LS stretch as trying towards R is provocative today). Pt reports 6/10 pain on departure, no adverse events or increase in UE symptoms. Recommend continuing along current POC to address relevant MSK deficits.  Eval - Patient is a pleasant 62 y.o. gentleman who was seen today for physical therapy evaluation and treatment for chronic neck pain. Red flag questioning is overall reassuring although neuro exam is somewhat confounded by complex medical history (see above, includes CVA and multiple TIAs, reported crushing injury distal RLE several years ago). Pt notes these symptoms are similar to symptoms prior to last cervical fusion and are limiting his tolerance to daily and work activities. On exam pt demonstrates cervical and GH mobility deficits with corresponding muscular irritation of R LS/UT, deltoid and proximal biceps. RUE numbness able to be resolved w/ pressure on R LS trigger  point but returns with cessation of pressure. Increased time w/ history and exam given complex medical hx and symptom irritability, subsequently deferred HEP to next visit. No adverse events, report of muscular fatigue on  departure but no overt increase in symptoms. Recommend skilled PT to address aforementioned deficits w/ aim of maximizing pt tolerance to daily activities. Pt departs today's session in no acute distress, all voiced questions/concerns addressed appropriately from PT perspective.     OBJECTIVE IMPAIRMENTS: decreased activity tolerance, decreased endurance, decreased mobility, decreased ROM, decreased strength, hypomobility, increased muscle spasms, impaired UE functional use, postural dysfunction, and pain.    ACTIVITY LIMITATIONS: carrying, lifting, bending, sleeping, and reach over head   PARTICIPATION LIMITATIONS: meal prep, cleaning, laundry, community activity, and occupation   PERSONAL FACTORS: Time since onset of injury/illness/exacerbation and 3+ comorbidities: HTN, CVA, cardiomyopathy, CKD3, DM2  are also affecting patient's functional outcome.    REHAB POTENTIAL: Fair given chronicity and comorbidities   CLINICAL DECISION MAKING: Evolving/moderate complexity   EVALUATION COMPLEXITY: Moderate     GOALS: Goals reviewed with patient? No given time constraints   SHORT TERM GOALS: Target date: 02/16/2023 Pt will demonstrate appropriate understanding and performance of initially prescribed HEP in order to facilitate improved independence with management of symptoms.  Baseline: HEP TBD Goal status: INITIAL    2. Pt will score greater than or equal to 45 on FOTO in order to demonstrate improved perception of function due to symptoms.            Baseline: 37            Goal status: INITIAL     LONG TERM GOALS: Target date: 03/16/2023 Pt will score 52 on FOTO in order to demonstrate improved perception of function due to symptoms. Baseline: 37 Goal status: INITIAL   2.  Pt will demonstrate at least 55 degrees of active cervical rotation to R in order to demonstrate improved environmental awareness and safety with driving.  Baseline: see ROM chart above Goal status: INITIAL   3. Pt will demonstrate at least 140deg of active R shoulder elevation in order to facilitate improved tolerance w/ household/work tasks Baseline: see ROM chart above Goal status: INITIAL    4. Pt will report/demonstrate ability to lift up to 10# with less than 2 point increase in resting pain on NPS in order to demonstrate improved tolerance to work activities. Baseline: NT due to symptom irritability Goal status: INITIAL      PLAN:   PT FREQUENCY: 1-2x/week   PT DURATION: 8 weeks   PLANNED INTERVENTIONS: Therapeutic exercises, Therapeutic activity, Neuromuscular re-education, Balance training, Gait training, Patient/Family education, Self Care, Joint mobilization, Vestibular training, Aquatic Therapy, Dry Needling, Electrical stimulation, Spinal mobilization, Cryotherapy, Moist heat, Taping, Manual therapy, and Re-evaluation   PLAN FOR NEXT SESSION: Manual PRN as indicated. Gentle cervical mobility, cervical/periscapular activation exercises as able/appropriate     Voncille Lo, PT, [place] Certified Exercise Expert for the Aging Adult  02/09/23 4:52 PM Phone: [phone] Fax: [phone]

## 2023-02-11 ENCOUNTER — Ambulatory Visit: Payer: Medicare HMO | Admitting: Physical Therapy

## 2023-02-11 NOTE — Therapy (Signed)
OUTPATIENT PHYSICAL THERAPY TREATMENT NOTE   Patient Name: Nicholas [name] MRN: [id number] DOB:27-Nov-1961, 62 y.o., male Today's Date: 02/16/2023  PCP: [name], MD   REFERRING PROVIDER: Leeanne Rio, MD  END OF SESSION:   PT End of Session - 02/16/23 1502     Visit Number 3    Number of Visits --   1-2x/week   Date for PT Re-Evaluation 03/16/23    Authorization Type [employer] medic[employer]    Authorization Time Period 12 visits 01/25/23-03/13/23    Authorization - Visit Number 4    Authorization - Number of Visits 12    Progress Note Due on Visit 10    PT Start Time 1503    PT Stop Time 1532    PT Time Calculation (min) 29 min    Activity Tolerance Patient limited by pain;No increased pain    Behavior During Therapy WFL for tasks assessed/performed               Past Medical History:  Diagnosis Date   Asthma    Bipolar 1 disorder ([place])    Borderline glaucoma    Chronic pain    Epididymal pain    LEFT   Epilepsy, 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9---  LAST SEIZURE 1 WK AGO (APPROX ,  10-31-2013)   NO NEUROLOGIST---  PT SEES PCP  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   Feeling of incomplete bladder emptying    Frequency of urination    Gastric ulcer    GERD (gastroesophageal reflux disease)    Hypertension    Hyperthyroidism    NO MEDS    Migraine    Seizures (HCC)    Stroke ([place])    TIA (transient ischemic attack)    Type 2 diabetes mellitus ([place])    Past Surgical History:  Procedure Laterality Date   ABDOMINAL SURGERY     ANTERIOR CERVICAL DECOMP/DISCECTOMY FUSION  2007   C4  --  C6   CERVICAL FUSION     CHOLECYSTECTOMY     COLONOSCOPY WITH PROPOFOL Left 11/02/2020   Procedure: COLONOSCOPY WITH PROPOFOL;  Surgeon: Arta Silence, MD;  Location: [facility];  Service: Endoscopy;  Laterality: Left;   CYSTOSCOPY N/A 11/09/2013   Procedure: CYSTOSCOPY FLEXIBLE;  Surgeon: Irine Seal, MD;  Location: [facility];  Service: Urology;  Laterality: N/A;   EPIDIDYMECTOMY  Left 11/09/2013   Procedure:  LEFT EPIDIDYMECTOMY;  Surgeon: Irine Seal, MD;  Location: [facility];  Service: Urology;  Laterality: Left;  POSSIBLE OUTPATIENT WITH OBSERVATION   EXCISION LIPOMA LEFT SHOULDER  2004 (APPROX)   MULTIPLE CYST REMOVED FROM CHEST  AGE 51   OTHER SURGICAL HISTORY     hemorroid surgery    TESTICLE REMOVAL Left    TONSILLECTOMY     Patient Active Problem List   Diagnosis Date Noted   Dizziness 01/02/2023   Bipolar 1 disorder ([place]) 07/01/2022   Hyperlipidemia 07/01/2022   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 48/18/5631   Metabolic acidosis 49/70/2637   Palpitations 05/08/2022   Seizures ([place]) 12/03/2020   Rectal bleeding 10/31/2020   Cardiomyopathy ([place]) 09/23/2020   Atypical chest pain 09/23/2020   Precordial pain    CVA (cerebral vascular accident) ([place]) 09/02/2020   Seizure ([place]) 07/22/2020   Left-sided weakness 07/04/2020   Dysarthria 07/04/2020   Essential hypertension    Seizure disorder ([place]) 07/02/2017   Type 2 diabetes mellitus with diabetic neuropathy, with long-term current use of insulin and hyperglycemia 07/02/2017  Stage 3a chronic kidney disease (CKD) ([place]) 07/02/2017   Normocytic anemia 07/02/2017   Testicular/scrotal pain 11/09/2013   Microhematuria 11/09/2013   Condyloma acuminatum of scrotum 11/09/2013    REFERRING DIAG: Z98.1 (ICD-10-CM) - History of fusion of cervical spine M54.2 (ICD-10-CM) - Neck pain  THERAPY DIAG:  Cervicalgia  Abnormal posture  Muscle weakness (generalized)  Rationale for Evaluation and Treatment Rehabilitation  PERTINENT HISTORY: HTN, CVA/TIA, cardiomyopathy, DM2, seizures, CKD3, bipolar 1, migraines, sleep apnea    PRECAUTIONS: seizure precautions, cardiac hx, cervical fusions (C4-T1 per pt), fall risk  SUBJECTIVE:                                                                                                                                                                                       SUBJECTIVE STATEMENT:   02/16/2023 Pt states that he did great with needling last week, states he got relief for the remainder of the day and for the following day. No pain at all and improved sleep quality. Notes he has been doing stretches at home and feels it is bothering his neck, although he has not had UE symptoms since needling. States he has significant pain at present however    PAIN:  Are you having pain: pt politely declines NPS on this date Location/description: L neck/shoulder with some referral into UE, describes as fatigued and spasms. Numbness described in radial distribution Best-worst over past week: 0-10/10  Per eval -  - aggravating factors: turning head to R, stretching, sleeping, bending, lifting, twisting - Easing factors: medication, TENS, biofreeze   OBJECTIVE: (objective measures completed at initial evaluation unless otherwise dated)   DIAGNOSTIC FINDINGS:  No recent cervical imaging CTH/MRI head and MRI lumbar spine July 2023, see epic for details   PATIENT SURVEYS:  FOTO 37 current, 52 predicted   COGNITION: Overall cognitive status: Within functional limits for tasks assessed although pt endorses mild memory issues since stroke   SENSATION/NEURO: Light touch intact all extremities but reduced on distal RLE, allodynia on C4-C7 (reports sensory changes after stroke and prior RLE injury) Finger<>nose testing mild incoordination RUE compared to L, no overt ataxia but intermittent dysmetria (pt attributes this to glaucoma issues) No clonus BIL Negative hoffmann and tromner sign BIL No ataxia with gait No aberrant eye movements or symptoms with saccades/smooth pursuits   POSTURE: significant forward head posture, trunk lean to L, increased L UT elevation   PALPATION: Concordant tenderness w/ trigger points R LS/UT, deltoid and biceps. Also able to resolve UE numbness w/ palpation of trigger point in R LS although returns immediately  after cessation of pressure.  CERVICAL ROM:    Active ROM A/PROM (deg) eval  Flexion 50% *  Extension 75% s  Right lateral flexion    Left lateral flexion    Right rotation 40 deg *  Left rotation 60 deg    (Blank rows = not tested)   UPPER EXTREMITY ROM:   Active ROM Right eval Left eval  Shoulder flexion 110 * s  150 deg  Shoulder abduction      Shoulder internal rotation      Shoulder external rotation      Elbow flexion      Elbow extension      Wrist flexion      Wrist extension       (Blank rows = not tested) (Key: WFL = within functional limits not formally assessed, * = concordant pain, s = stiffness/stretching sensation, NT = not tested)  Comments:   UPPER EXTREMITY MMT:   MMT Right eval Left eval  Shoulder flexion      Shoulder extension      Shoulder abduction      Shoulder extension      Shoulder internal rotation      Shoulder external rotation      Elbow flexion      Elbow extension      Grip strength      (Blank rows = not tested)  (Key: WFL = within functional limits not formally assessed, * = concordant pain, s = stiffness/stretching sensation, NT = not tested)  Comments: deferred on eval given symptom irritability   CERVICAL SPECIAL TESTS:  Spurlings: + on R, mild discomfort on L  Sharps Purser: negative   FUNCTIONAL TESTS:  Overhead reach limited as above   TODAY'S TREATMENT:   [facility] Adult PT Treatment:                                                DATE: 02/16/23 Manual Therapy: Seated: STM R LS, UT, rhomboid, infraspinatus, posterior deltoid; trigger point release LS and UT  Therapeutic Activity: Discussion re: activity modification, relevant anatomy/physiology as it pertains to activity, PT POC and rationale for interventions Pt politely declines exercise on this date  [facility] Adult PT Treatment:                                                DATE: 02-09-23 Therapeutic Exercise: Radial nerve glides, supine x10  no pain post  TPDN Deep neck flexor stretch with towel roll and chin tuck 10 sec hold x 10 with VC and TC Seated Scapular Retraction  x 10 with VC  Levator  Stretch  reps - 30sec hold x 2 Shoulder ER with scapular retraction-  2 x 10 reps - Scapular Retraction with RTB 10 reps - Shoulder Extension with RTB Palms Forward  - 10 reps Manual Therapy:  STW R posterior shoulder, deltoid, tricep supine STM and gentle trigger point release R Levator UT,  Trigger Point Dry-Needling performed 02-09-23  by Voncille Lo Treatment instructions: Expect mild to moderate muscle soreness. S/S of pneumothorax if dry needled over a lung field, and to seek immediate medical attention should they occur. Patient verbalized understanding of these instructions and education.  Patient Consent Given: Yes Education handout  provided: Previously provided Muscles treated:  RT upper trap and RT levator Electrical stimulation performed: No Parameters: N/A Treatment response/outcome: twitch response noted, pt noted relief                                                                                     [facility] Adult PT Treatment:                                                DATE: 02/04/23 Therapeutic Exercise: Radial nerve glides, seated x10  LS stretch towards L 3x30sec seated cues for setup and comfortable ROM  UT stretch towards L 2x30sec seated cues for comfortable ROM Cervical flexion stretch 2x30sec seated cues for comfortable ROM Scapular retraction x8 cues for reduced compensations, comfortable ROM  Manual Therapy: Very gentle STM R posterior shoulder, deltoid, tricep; seated STM and gentle trigger point release R LS, UT, and rhomboid; seated    PATIENT EDUCATION:  Education details: rationale for interventions, PT POC Person educated: Patient Education method: Explanation, Demonstration, Tactile cues, Verbal cues Education comprehension: verbalized understanding, returned demonstration, verbal cues required,  tactile cues required, and needs further education     HOME EXERCISE PROGRAM: Access Code: [email or web address] URL: [email or web address] Date: 02/09/2023 Prepared by: Voncille Lo  Exercises - Supine Cervical Retraction with Towel  - 1 x daily - 7 x weekly - 1 sets - 10 reps - Seated Scapular Retraction  - 1 x daily - 7 x weekly - 2 sets - 8 reps - Gentle Levator Scapulae Stretch  - 1 x daily - 7 x weekly - 1 sets - 3 reps - 30sec hold - Shoulder External Rotation and Scapular Retraction with Resistance  - 2 x daily - 7 x weekly - 3 sets - 10 reps - Scapular Retraction with Resistance  - 2 x daily - 7 x weekly - 3 sets - 10 reps - Shoulder Extension with Resistance - Palms Forward  - 1 x daily - 7 x weekly - 3 sets - 10 reps   ASSESSMENT:   CLINICAL IMPRESSION: 02/16/2023 Pt arrives w/ significant pain although he reports excellent response to needling last week. Pt politely declines active interventions on this date despite education on their importance, states he feels dry needling is most helpful. After discussion, elected to perform STM and trigger point release on this date, pt endorses relief during and verbalizes reduced stiffness and pain severity afterwards. Continues to politely decline exercise after manual. Discussed PT POC and rationale for interventions, pt verbalizes understanding of treatment options. No adverse events, pt departs with mildly improved symptoms. Pt departs today's session in no acute distress, all voiced questions/concerns addressed appropriately from PT perspective.     Eval - Patient is a pleasant 62 y.o. gentleman who was seen today for physical therapy evaluation and treatment for chronic neck pain. Red flag questioning is overall reassuring although neuro exam is somewhat confounded by complex medical history (see above, includes CVA and multiple TIAs, reported crushing injury distal RLE several years ago). Pt notes  these symptoms are similar to  symptoms prior to last cervical fusion and are limiting his tolerance to daily and work activities. On exam pt demonstrates cervical and GH mobility deficits with corresponding muscular irritation of R LS/UT, deltoid and proximal biceps. RUE numbness able to be resolved w/ pressure on R LS trigger point but returns with cessation of pressure. Increased time w/ history and exam given complex medical hx and symptom irritability, subsequently deferred HEP to next visit. No adverse events, report of muscular fatigue on departure but no overt increase in symptoms. Recommend skilled PT to address aforementioned deficits w/ aim of maximizing pt tolerance to daily activities. Pt departs today's session in no acute distress, all voiced questions/concerns addressed appropriately from PT perspective.     OBJECTIVE IMPAIRMENTS: decreased activity tolerance, decreased endurance, decreased mobility, decreased ROM, decreased strength, hypomobility, increased muscle spasms, impaired UE functional use, postural dysfunction, and pain.    ACTIVITY LIMITATIONS: carrying, lifting, bending, sleeping, and reach over head   PARTICIPATION LIMITATIONS: meal prep, cleaning, laundry, community activity, and occupation   PERSONAL FACTORS: Time since onset of injury/illness/exacerbation and 3+ comorbidities: HTN, CVA, cardiomyopathy, CKD3, DM2  are also affecting patient's functional outcome.    REHAB POTENTIAL: Fair given chronicity and comorbidities   CLINICAL DECISION MAKING: Evolving/moderate complexity   EVALUATION COMPLEXITY: Moderate     GOALS: Goals reviewed with patient? No given time constraints   SHORT TERM GOALS: Target date: 02/16/2023 Pt will demonstrate appropriate understanding and performance of initially prescribed HEP in order to facilitate improved independence with management of symptoms.  Baseline: HEP TBD Goal status: INITIAL    2. Pt will score greater than or equal to 45 on FOTO in order to  demonstrate improved perception of function due to symptoms.            Baseline: 37            Goal status: INITIAL     LONG TERM GOALS: Target date: 03/16/2023 Pt will score 52 on FOTO in order to demonstrate improved perception of function due to symptoms. Baseline: 37 Goal status: INITIAL   2. Pt will demonstrate at least 55 degrees of active cervical rotation to R in order to demonstrate improved environmental awareness and safety with driving.  Baseline: see ROM chart above Goal status: INITIAL   3. Pt will demonstrate at least 140deg of active R shoulder elevation in order to facilitate improved tolerance w/ household/work tasks Baseline: see ROM chart above Goal status: INITIAL    4. Pt will report/demonstrate ability to lift up to 10# with less than 2 point increase in resting pain on NPS in order to demonstrate improved tolerance to work activities. Baseline: NT due to symptom irritability Goal status: INITIAL      PLAN:   PT FREQUENCY: 1-2x/week   PT DURATION: 8 weeks   PLANNED INTERVENTIONS: Therapeutic exercises, Therapeutic activity, Neuromuscular re-education, Balance training, Gait training, Patient/Family education, Self Care, Joint mobilization, Vestibular training, Aquatic Therapy, Dry Needling, Electrical stimulation, Spinal mobilization, Cryotherapy, Moist heat, Taping, Manual therapy, and Re-evaluation   PLAN FOR NEXT SESSION: Manual PRN as indicated. Gentle cervical mobility, cervical/periscapular activation exercises as able/appropriate     Leeroy Cha PT, DPT 02/16/2023 3:43 PM

## 2023-02-16 ENCOUNTER — Encounter: Payer: Self-pay | Admitting: Physical Therapy

## 2023-02-16 ENCOUNTER — Ambulatory Visit: Payer: Medicare HMO | Admitting: Physical Therapy

## 2023-02-16 DIAGNOSIS — M6281 Muscle weakness (generalized): Secondary | ICD-10-CM | POA: Diagnosis not present

## 2023-02-16 DIAGNOSIS — R293 Abnormal posture: Secondary | ICD-10-CM | POA: Diagnosis not present

## 2023-02-16 DIAGNOSIS — M542 Cervicalgia: Secondary | ICD-10-CM

## 2023-02-17 NOTE — Therapy (Signed)
OUTPATIENT PHYSICAL THERAPY TREATMENT NOTE   Patient Name: Nicholas [name] MRN: [id number] DOB:10-31-1961, 62 y.o., male Today's Date: 02/18/2023  PCP: [name], MD   REFERRING PROVIDER: Leeanne Rio, MD  END OF SESSION:   PT End of Session - 02/18/23 1509     Visit Number 5    Date for PT Re-Evaluation 03/16/23    Authorization Type human[employer] medicare    Authorization Time Period 12 visits 01/25/23-03/13/23    Authorization - Visit Number 5    Authorization - Number of Visits 12    Progress Note Due on Visit 10    PT Start Time 1505    PT Stop Time 1550    PT Time Calculation (min) 45 min    Activity Tolerance Patient limited by pain;No increased pain    Behavior During Therapy WFL for tasks assessed/performed                Past Medical History:  Diagnosis Date   Asthma    Bipolar 1 disorder ([place])    Borderline glaucoma    Chronic pain    Epididymal pain    LEFT   Epilepsy, 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12---  LAST SEIZURE 1 WK AGO (APPROX ,  10-31-2013)   NO NEUROLOGIST---  PT SEES PCP  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   Feeling of incomplete bladder emptying    Frequency of urination    Gastric ulcer    GERD (gastroesophageal reflux disease)    Hypertension    Hyperthyroidism    NO MEDS    Migraine    Seizures (HCC)    Stroke ([place])    TIA (transient ischemic attack)    Type 2 diabetes mellitus ([place])    Past Surgical History:  Procedure Laterality Date   ABDOMINAL SURGERY     ANTERIOR CERVICAL DECOMP/DISCECTOMY FUSION  2007   C4  --  C6   CERVICAL FUSION     CHOLECYSTECTOMY     COLONOSCOPY WITH PROPOFOL Left 11/02/2020   Procedure: COLONOSCOPY WITH PROPOFOL;  Surgeon: Arta Silence, MD;  Location: [facility];  Service: Endoscopy;  Laterality: Left;   CYSTOSCOPY N/A 11/09/2013   Procedure: CYSTOSCOPY FLEXIBLE;  Surgeon: Irine Seal, MD;  Location: [facility];  Service: Urology;  Laterality: N/A;   EPIDIDYMECTOMY Left 11/09/2013   Procedure:   LEFT EPIDIDYMECTOMY;  Surgeon: Irine Seal, MD;  Location: [facility];  Service: Urology;  Laterality: Left;  POSSIBLE OUTPATIENT WITH OBSERVATION   EXCISION LIPOMA LEFT SHOULDER  2004 (APPROX)   MULTIPLE CYST REMOVED FROM CHEST  AGE 66   OTHER SURGICAL HISTORY     hemorroid surgery    TESTICLE REMOVAL Left    TONSILLECTOMY     Patient Active Problem List   Diagnosis Date Noted   Dizziness 01/02/2023   Bipolar 1 disorder ([place]) 07/01/2022   Hyperlipidemia 07/01/2022   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 A999333   Metabolic acidosis A999333   Palpitations 05/08/2022   Seizures ([place]) 12/03/2020   Rectal bleeding 10/31/2020   Cardiomyopathy ([place]) 09/23/2020   Atypical chest pain 09/23/2020   Precordial pain    CVA (cerebral vascular accident) ([place]) 09/02/2020   Seizure ([place]) 07/22/2020   Left-sided weakness 07/04/2020   Dysarthria 07/04/2020   Essential hypertension    Seizure disorder ([place]) 07/02/2017   Type 2 diabetes mellitus with diabetic neuropathy, with long-term current use of insulin and hyperglycemia 07/02/2017   Stage 3a chronic kidney disease (CKD) ([place]) 07/02/2017  Normocytic anemia 07/02/2017   Testicular/scrotal pain 11/09/2013   Microhematuria 11/09/2013   Condyloma acuminatum of scrotum 11/09/2013    REFERRING DIAG: Z98.1 (ICD-10-CM) - History of fusion of cervical spine M54.2 (ICD-10-CM) - Neck pain  THERAPY DIAG:  Abnormal posture  Muscle weakness (generalized)  Cervicalgia  Rationale for Evaluation and Treatment Rehabilitation  PERTINENT HISTORY: HTN, CVA/TIA, cardiomyopathy, DM2, seizures, CKD3, bipolar 1, migraines, sleep apnea    PRECAUTIONS: seizure precautions, cardiac hx, cervical fusions (C4-T1 per pt), fall risk  SUBJECTIVE:                                                                                                                                                                                       SUBJECTIVE STATEMENT:   02/18/2023  I like the needling and I think is helps.  9/10 pain at beginning and after RX  5/10.There is a home exercise program that really doesn't really help me.  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  After PT told pt he needs to be able to be aware of neutral posture.  Pt states he will  not do it because he is going to lie down and watch TV the way he wants to do it.     PAIN:  Are you having pain: pt politely declines NPS on this date Location/description: L neck/shoulder with some referral into UE, describes as fatigued and spasms. Numbness described in radial distribution Best-worst over past week: 0-10/10  Per eval -  - aggravating factors: turning head to R, stretching, sleeping, bending, lifting, twisting - Easing factors: medication, TENS, biofreeze   OBJECTIVE: (objective measures completed at initial evaluation unless otherwise dated)   DIAGNOSTIC FINDINGS:  No recent cervical imaging CTH/MRI head and MRI lumbar spine July 2023, see epic for details   PATIENT SURVEYS:  FOTO 37 current, 52 predicted   COGNITION: Overall cognitive status: Within functional limits for tasks assessed although pt endorses mild memory issues since stroke   SENSATION/NEURO: Light touch intact all extremities but reduced on distal RLE, allodynia on C4-C7 (reports sensory changes after stroke and prior RLE injury) Finger<>nose testing mild incoordination RUE compared to L, no overt ataxia but intermittent dysmetria (pt attributes this to glaucoma issues) No clonus BIL Negative hoffmann and tromner sign BIL No ataxia with gait No aberrant eye movements or symptoms with saccades/smooth pursuits   POSTURE: significant forward head posture, trunk lean to L, increased L UT elevation   PALPATION: Concordant tenderness w/ trigger points R LS/UT, deltoid and biceps. Also able to resolve UE numbness w/ palpation of trigger point in R LS  although returns immediately after cessation of pressure.              CERVICAL ROM:    Active ROM A/PROM (deg) eval AROM 02-18-23  Flexion 50% *   Extension 75% s   Right lateral flexion     Left lateral flexion     Right rotation 40 deg * 49  Left rotation 60 deg  60   (Blank rows = not tested)   UPPER EXTREMITY ROM:   Active ROM Right eval Left eval  Shoulder flexion 110 * s  150 deg  Shoulder abduction      Shoulder internal rotation      Shoulder external rotation      Elbow flexion      Elbow extension      Wrist flexion      Wrist extension       (Blank rows = not tested) (Key: WFL = within functional limits not formally assessed, * = concordant pain, s = stiffness/stretching sensation, NT = not tested)  Comments:   UPPER EXTREMITY MMT:   MMT Right eval Left eval  Shoulder flexion      Shoulder extension      Shoulder abduction      Shoulder extension      Shoulder internal rotation      Shoulder external rotation      Elbow flexion      Elbow extension      Grip strength      (Blank rows = not tested)  (Key: WFL = within functional limits not formally assessed, * = concordant pain, s = stiffness/stretching sensation, NT = not tested)  Comments: deferred on eval given symptom irritability   CERVICAL SPECIAL TESTS:  Spurlings: + on R, mild discomfort on L  Sharps Purser: negative   FUNCTIONAL TESTS:  Overhead reach limited as above   TODAY'S TREATMENT:   [facility] Adult PT Treatment:                                                DATE: 02-17-22 Therapeutic Exercise: All exercises done in supine concurrently with moist hot pack Deep neck flexor stretch with towel roll and chin tuck 10 sec hold x 10 with VC and TC  Pt able to hold 40 sec supine deep flexion isometric at end Shoulder ER with scapular retraction-   3 x 10 reps Seated Scapular Retraction  x 10 with VC  Levator  Stretch  reps - 30sec hold x 2 Shoulder Extension with RTB Palms Forward  - 10  reps Manual Therapy:  STW R upper trap and scalenes and deltoid/ rhomboid STM and gentle trigger point release R Levator,  UT,  Trigger Point Dry-Needling performed 02-09-23  by Voncille Lo Treatment instructions: Expect mild to moderate muscle soreness. S/S of pneumothorax if dry needled over a lung field, and to seek immediate medical attention should they occur. Patient verbalized understanding of these instructions and education. Patient Consent Given: Yes Education handout provided: Previously provided Muscles treated:  RT upper trap and RT levator, C 3, 4 ,5 RT cervical paraspinal, R rhomboid, R deltoid Electrical stimulation performed: No Parameters: N/A Treatment response/outcome: twitch response noted, pt noted relief Modalities  moist cervical hot pack  [facility] Adult PT Treatment:  DATE: 02/16/23 Manual Therapy: Seated: STM R LS, UT, rhomboid, infraspinatus, posterior deltoid; trigger point release LS and UT  Therapeutic Activity: Discussion re: activity modification, relevant anatomy/physiology as it pertains to activity, PT POC and rationale for interventions Pt politely declines exercise on this date  [facility] Adult PT Treatment:                                                DATE: 02-09-23 Therapeutic Exercise: Radial nerve glides, supine x10  no pain post TPDN Deep neck flexor stretch with towel roll and chin tuck 10 sec hold x 10 with VC and TC Seated Scapular Retraction  x 10 with VC  Levator  Stretch  reps - 30sec hold x 2 Shoulder ER with scapular retraction-  2 x 10 reps - Scapular Retraction with RTB 10 reps - Shoulder Extension with RTB Palms Forward  - 10 reps Manual Therapy:  STW R posterior shoulder, deltoid, tricep supine STM and gentle trigger point release R Levator UT,  Trigger Point Dry-Needling performed 02-09-23  by Voncille Lo Treatment instructions: Expect mild to moderate muscle soreness. S/S of  pneumothorax if dry needled over a lung field, and to seek immediate medical attention should they occur. Patient verbalized understanding of these instructions and education.  Patient Consent Given: Yes Education handout provided: Previously provided Muscles treated:  RT upper trap and RT levator Electrical stimulation performed: No Parameters: N/A Treatment response/outcome: twitch response noted, pt noted relief                                                                                     [facility] Adult PT Treatment:                                                DATE: 02/04/23 Therapeutic Exercise: Radial nerve glides, seated x10  LS stretch towards L 3x30sec seated cues for setup and comfortable ROM  UT stretch towards L 2x30sec seated cues for comfortable ROM Cervical flexion stretch 2x30sec seated cues for comfortable ROM Scapular retraction x8 cues for reduced compensations, comfortable ROM  Manual Therapy: Very gentle STM R posterior shoulder, deltoid, tricep; seated STM and gentle trigger point release R LS, UT, and rhomboid; seated    PATIENT EDUCATION:  Education details: rationale for interventions, PT POC Person educated: Patient Education method: Explanation, Demonstration, Tactile cues, Verbal cues Education comprehension: verbalized understanding, returned demonstration, verbal cues required, tactile cues required, and needs further education     HOME EXERCISE PROGRAM: Access Code: [email or web address] URL: [email or web address] Date: 02/09/2023 Prepared by: Voncille Lo  Exercises - Supine Cervical Retraction with Towel  - 1 x daily - 7 x weekly - 1 sets - 10 reps - Seated Scapular Retraction  - 1 x daily - 7 x weekly - 2 sets - 8 reps - Gentle Levator Scapulae Stretch  - 1 x daily - 7 x weekly -  1 sets - 3 reps - 30sec hold - Shoulder External Rotation and Scapular Retraction with Resistance  - 2 x daily - 7 x weekly - 3 sets - 10 reps - Scapular  Retraction with Resistance  - 2 x daily - 7 x weekly - 3 sets - 10 reps - Shoulder Extension with Resistance - Palms Forward  - 1 x daily - 7 x weekly - 3 sets - 10 reps   ASSESSMENT:   CLINICAL IMPRESSION: 02/18/2023 Pt arrives w/ 9/10 and reduced to 5/10.  Pt appreciates the TPDN but PT clearly explained that TPDN works best with movement after TPDN for carryover and AROM gains.  Pt was able to maintain supine flexion 1 inch from horizontal for 40 seconds. Pt consented positively to TPDN and was closely monitored throughout session.  Pt with twitch response and palpable lenghthening. Pt explained importance of movement and pt verbalized understanding. No adverse events, pt departs with improved symptoms.and improved AROM of cervical rotation.  Will try to accommodate patient for additional dry needling with exercise with subsequent visits     Eval - Patient is a pleasant 62 y.o. gentleman who was seen today for physical therapy evaluation and treatment for chronic neck pain. Red flag questioning is overall reassuring although neuro exam is somewhat confounded by complex medical history (see above, includes CVA and multiple TIAs, reported crushing injury distal RLE several years ago). Pt notes these symptoms are similar to symptoms prior to last cervical fusion and are limiting his tolerance to daily and work activities. On exam pt demonstrates cervical and GH mobility deficits with corresponding muscular irritation of R LS/UT, deltoid and proximal biceps. RUE numbness able to be resolved w/ pressure on R LS trigger point but returns with cessation of pressure. Increased time w/ history and exam given complex medical hx and symptom irritability, subsequently deferred HEP to next visit. No adverse events, report of muscular fatigue on departure but no overt increase in symptoms. Recommend skilled PT to address aforementioned deficits w/ aim of maximizing pt tolerance to daily activities. Pt departs today's  session in no acute distress, all voiced questions/concerns addressed appropriately from PT perspective.     OBJECTIVE IMPAIRMENTS: decreased activity tolerance, decreased endurance, decreased mobility, decreased ROM, decreased strength, hypomobility, increased muscle spasms, impaired UE functional use, postural dysfunction, and pain.    ACTIVITY LIMITATIONS: carrying, lifting, bending, sleeping, and reach over head   PARTICIPATION LIMITATIONS: meal prep, cleaning, laundry, community activity, and occupation   PERSONAL FACTORS: Time since onset of injury/illness/exacerbation and 3+ comorbidities: HTN, CVA, cardiomyopathy, CKD3, DM2  are also affecting patient's functional outcome.    REHAB POTENTIAL: Fair given chronicity and comorbidities   CLINICAL DECISION MAKING: Evolving/moderate complexity   EVALUATION COMPLEXITY: Moderate     GOALS: Goals reviewed with patient? No given time constraints   SHORT TERM GOALS: Target date: 02/16/2023 Pt will demonstrate appropriate understanding and performance of initially prescribed HEP in order to facilitate improved independence with management of symptoms.  Baseline: HEP TBD Goal status: INITIAL    2. Pt will score greater than or equal to 45 on FOTO in order to demonstrate improved perception of function due to symptoms.            Baseline: 37            Goal status: INITIAL     LONG TERM GOALS: Target date: 03/16/2023 Pt will score 52 on FOTO in order to demonstrate improved perception of function due to  symptoms. Baseline: 37 Goal status: INITIAL   2. Pt will demonstrate at least 55 degrees of active cervical rotation to R in order to demonstrate improved environmental awareness and safety with driving.  Baseline: see ROM chart above Goal status: INITIAL   3. Pt will demonstrate at least 140deg of active R shoulder elevation in order to facilitate improved tolerance w/ household/work tasks Baseline: see ROM chart above Goal status:  INITIAL    4. Pt will report/demonstrate ability to lift up to 10# with less than 2 point increase in resting pain on NPS in order to demonstrate improved tolerance to work activities. Baseline: NT due to symptom irritability Goal status: INITIAL      PLAN:   PT FREQUENCY: 1-2x/week   PT DURATION: 8 weeks   PLANNED INTERVENTIONS: Therapeutic exercises, Therapeutic activity, Neuromuscular re-education, Balance training, Gait training, Patient/Family education, Self Care, Joint mobilization, Vestibular training, Aquatic Therapy, Dry Needling, Electrical stimulation, Spinal mobilization, Cryotherapy, Moist heat, Taping, Manual therapy, and Re-evaluation   PLAN FOR NEXT SESSION: Manual PRN as indicated. Gentle cervical mobility, cervical/periscapular activation exercises as able/appropriate     Voncille Lo, PT, [place] Certified Exercise Expert for the Aging Adult  02/18/23 3:56 PM Phone: [phone] Fax: [phone]

## 2023-02-18 ENCOUNTER — Encounter: Payer: Self-pay | Admitting: Physical Therapy

## 2023-02-18 ENCOUNTER — Ambulatory Visit: Payer: Medicare HMO | Admitting: Physical Therapy

## 2023-02-18 DIAGNOSIS — M6281 Muscle weakness (generalized): Secondary | ICD-10-CM | POA: Diagnosis not present

## 2023-02-18 DIAGNOSIS — M542 Cervicalgia: Secondary | ICD-10-CM

## 2023-02-18 DIAGNOSIS — R293 Abnormal posture: Secondary | ICD-10-CM

## 2023-02-22 DIAGNOSIS — R32 Unspecified urinary incontinence: Secondary | ICD-10-CM | POA: Diagnosis not present

## 2023-02-22 NOTE — Therapy (Incomplete)
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33---  LAST SEIZURE 1 WK AGO (APPROX ,  10-31-2013)   NO NEUROLOGIST---  PT SEES PCP  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   Feeling of incomplete bladder emptying    Frequency of urination    Gastric ulcer    GERD (gastroesophageal reflux disease)    Hypertension    Hyperthyroidism    NO MEDS    Migraine    Seizures ([place])    Stroke ([place])    TIA (transient ischemic attack)    Type 2 diabetes mellitus ([place])    Past Surgical History:  Procedure Laterality Date   ABDOMINAL SURGERY     ANTERIOR CERVICAL DECOMP/DISCECTOMY FUSION  2007   C4  --  C6   CERVICAL FUSION     CHOLECYSTECTOMY     COLONOSCOPY WITH PROPOFOL Left 11/02/2020   Procedure: COLONOSCOPY WITH PROPOFOL;  Surgeon: Arta Silence, MD;  Location: [facility];  Service: Endoscopy;  Laterality: Left;   CYSTOSCOPY N/A 11/09/2013   Procedure: CYSTOSCOPY FLEXIBLE;  Surgeon: Irine Seal, MD;  Location[facility];  Service: Urology;  Laterality: N/A;   EPIDIDYMECTOMY Left 11/09/2013   Procedure:  LEFT EPIDIDYMECTOMY;  Surgeon: Irine Seal, MD;  Location: [facility];  Service: Urology;  Laterality: Left;  POSSIBLE OUTPATIENT WITH OBSERVATION   EXCISION LIPOMA LEFT SHOULDER  2004 (APPROX)   MULTIPLE CYST REMOVED FROM CHEST  AGE 59   OTHER SURGICAL HISTORY     hemorroid surgery    TESTICLE REMOVAL Left    TONSILLECTOMY     Patient Active Problem List   Diagnosis Date Noted   Dizziness 01/02/2023   Bipolar 1 disorder ([place]) 07/01/2022   Hyperlipidemia 07/01/2022   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 A999333   Metabolic acidosis A999333   Palpitations 05/08/2022   Seizures ([place]) 12/03/2020   Rectal bleeding 10/31/2020   Cardiomyopathy ([place]) 09/23/2020   Atypical chest pain 09/23/2020   Precordial pain    CVA (cerebral vascular accident) ([place]) 09/02/2020   Seizure ([place]) 07/22/2020   Left-sided weakness 07/04/2020   Dysarthria 07/04/2020   Essential hypertension    Seizure disorder ([place]) 07/02/2017   Type 2 diabetes mellitus with diabetic neuropathy, with long-term current use of insulin and hyperglycemia 07/02/2017   Stage 3a chronic kidney disease (CKD) ([place]) 07/02/2017   Normocytic anemia 07/02/2017   Testicular/scrotal pain 11/09/2013   Microhematuria 11/09/2013   Condyloma acuminatum of scrotum 11/09/2013    REFERRING DIAG: Z98.1 (ICD-10-CM) - History of fusion of cervical spine M54.2 (ICD-10-CM) - Neck pain  THERAPY DIAG:  No diagnosis found.  Rationale for Evaluation and Treatment Rehabilitation  PERTINENT HISTORY: HTN, CVA/TIA, cardiomyopathy, DM2, seizures, CKD3, bipolar 1, migraines, sleep apnea    PRECAUTIONS: seizure precautions, cardiac hx, cervical fusions (C4-T1 per pt), fall risk  SUBJECTIVE:  SUBJECTIVE STATEMENT:   02/22/2023  ***  *** I like the needling and I think is helps.  9/10 pain at beginning and after RX  5/10.There is a home exercise program that really doesn't really help me.  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  After PT told pt he needs to be able to be aware of neutral posture.  Pt states he will  not do it because he is going to lie down and watch TV the way he wants to do it.     PAIN:  Are you having pain: pt politely declines NPS on this date Location/description: L neck/shoulder with some  referral into UE, describes as fatigued and spasms. Numbness described in radial distribution Best-worst over past week: 0-10/10  Per eval -  - aggravating factors: turning head to R, stretching, sleeping, bending, lifting, twisting - Easing factors: medication, TENS, biofreeze   OBJECTIVE: (objective measures completed at initial evaluation unless otherwise dated)   DIAGNOSTIC FINDINGS:  No recent cervical imaging CTH/MRI head and MRI lumbar spine July 2023, see epic for details   PATIENT SURVEYS:  FOTO 37 current, 52 predicted   COGNITION: Overall cognitive status: Within functional limits for tasks assessed although pt endorses mild memory issues since stroke   SENSATION/NEURO: Light touch intact all extremities but reduced on distal RLE, allodynia on C4-C7 (reports sensory changes after stroke and prior RLE injury) Finger<>nose testing mild incoordination RUE compared to L, no overt ataxia but intermittent dysmetria (pt attributes this to glaucoma issues) No clonus BIL Negative hoffmann and tromner sign BIL No ataxia with gait No aberrant eye movements or symptoms with saccades/smooth pursuits   POSTURE: significant forward head posture, trunk lean to L, increased L UT elevation   PALPATION: Concordant tenderness w/ trigger points R LS/UT, deltoid and biceps. Also able to resolve UE numbness w/ palpation of trigger point in R LS although returns immediately after cessation of pressure.              CERVICAL ROM:    Active ROM A/PROM (deg) eval AROM 02-18-23  Flexion 50% *   Extension 75% s   Right lateral flexion     Left lateral flexion     Right rotation 40 deg * 49  Left rotation 60 deg  60   (Blank rows = not tested)   UPPER EXTREMITY ROM:   Active ROM Right eval Left eval  Shoulder flexion 110 * s  150 deg  Shoulder abduction      Shoulder internal rotation      Shoulder external rotation      Elbow flexion      Elbow extension      Wrist flexion       Wrist extension       (Blank rows = not tested) (Key: WFL = within functional limits not formally assessed, * = concordant pain, s = stiffness/stretching sensation, NT = not tested)  Comments:   UPPER EXTREMITY MMT:   MMT Right eval Left eval  Shoulder flexion      Shoulder extension      Shoulder abduction      Shoulder extension      Shoulder internal rotation      Shoulder external rotation      Elbow flexion      Elbow extension      Grip strength      (Blank rows = not tested)  (Key:  WFL = within functional limits not formally assessed, * = concordant pain, s = stiffness/stretching sensation, NT = not tested)  Comments: deferred on eval given symptom irritability   CERVICAL SPECIAL TESTS:  Spurlings: + on R, mild discomfort on L  Sharps Purser: negative   FUNCTIONAL TESTS:  Overhead reach limited as above   TODAY'S TREATMENT:   [facility] Adult PT Treatment:                                                DATE: 02/23/23 Therapeutic Exercise: *** Manual Therapy: *** Neuromuscular re-ed: *** Therapeutic Activity: *** Modalities: *** Self Care: [facility] Adult PT Treatment:                                                DATE: 02-17-22 Therapeutic Exercise: All exercises done in supine concurrently with moist hot pack Deep neck flexor stretch with towel roll and chin tuck 10 sec hold x 10 with VC and TC  Pt able to hold 40 sec supine deep flexion isometric at end Shoulder ER with scapular retraction-   3 x 10 reps Seated Scapular Retraction  x 10 with VC  Levator  Stretch  reps - 30sec hold x 2 Shoulder Extension with RTB Palms Forward  - 10 reps Manual Therapy:  STW R upper trap and scalenes and deltoid/ rhomboid STM and gentle trigger point release R Levator,  UT,  Trigger Point Dry-Needling performed 02-09-23  by Voncille Lo Treatment instructions: Expect mild to moderate muscle soreness. S/S of pneumothorax if dry needled over a lung field, and to seek  immediate medical attention should they occur. Patient verbalized understanding of these instructions and education. Patient Consent Given: Yes Education handout provided: Previously provided Muscles treated:  RT upper trap and RT levator, C 3, 4 ,5 RT cervical paraspinal, R rhomboid, R deltoid Electrical stimulation performed: No Parameters: N/A Treatment response/outcome: twitch response noted, pt noted relief Modalities  moist cervical hot pack  [facility] Adult PT Treatment:                                                DATE: 02/16/23 Manual Therapy: Seated: STM R LS, UT, rhomboid, infraspinatus, posterior deltoid; trigger point release LS and UT  Therapeutic Activity: Discussion re: activity modification, relevant anatomy/physiology as it pertains to activity, PT POC and rationale for interventions Pt politely declines exercise on this date  [facility] Adult PT Treatment:                                                DATE: 02-09-23 Therapeutic Exercise: Radial nerve glides, supine x10  no pain post TPDN Deep neck flexor stretch with towel roll and chin tuck 10 sec hold x 10 with VC and TC Seated Scapular Retraction  x 10 with VC  Levator  Stretch  reps - 30sec hold x 2 Shoulder ER with scapular retraction-  2 x 10 reps -  Scapular Retraction with RTB 10 reps - Shoulder Extension with RTB Palms Forward  - 10 reps Manual Therapy:  STW R posterior shoulder, deltoid, tricep supine STM and gentle trigger point release R Levator UT,  Trigger Point Dry-Needling performed 02-09-23  by Voncille Lo Treatment instructions: Expect mild to moderate muscle soreness. S/S of pneumothorax if dry needled over a lung field, and to seek immediate medical attention should they occur. Patient verbalized understanding of these instructions and education.  Patient Consent Given: Yes Education handout provided: Previously provided Muscles treated:  RT upper trap and RT levator Electrical stimulation performed:  No Parameters: N/A Treatment response/outcome: twitch response noted, pt noted relief                                                                                      PATIENT EDUCATION:  Education details: rationale for interventions, PT POC Person educated: Patient Education method: Explanation, Demonstration, Tactile cues, Verbal cues Education comprehension: verbalized understanding, returned demonstration, verbal cues required, tactile cues required, and needs further education     HOME EXERCISE PROGRAM: Access Code: [email or web address] URL: [email or web address] Date: 02/09/2023 Prepared by: Voncille Lo  Exercises - Supine Cervical Retraction with Towel  - 1 x daily - 7 x weekly - 1 sets - 10 reps - Seated Scapular Retraction  - 1 x daily - 7 x weekly - 2 sets - 8 reps - Gentle Levator Scapulae Stretch  - 1 x daily - 7 x weekly - 1 sets - 3 reps - 30sec hold - Shoulder External Rotation and Scapular Retraction with Resistance  - 2 x daily - 7 x weekly - 3 sets - 10 reps - Scapular Retraction with Resistance  - 2 x daily - 7 x weekly - 3 sets - 10 reps - Shoulder Extension with Resistance - Palms Forward  - 1 x daily - 7 x weekly - 3 sets - 10 reps   ASSESSMENT:   CLINICAL IMPRESSION: 02/22/2023 ***  *** Pt arrives w/ 9/10 and reduced to 5/10.  Pt appreciates the TPDN but PT clearly explained that TPDN works best with movement after TPDN for carryover and AROM gains.  Pt was able to maintain supine flexion 1 inch from horizontal for 40 seconds. Pt consented positively to TPDN and was closely monitored throughout session.  Pt with twitch response and palpable lenghthening. Pt explained importance of movement and pt verbalized understanding. No adverse events, pt departs with improved symptoms.and improved AROM of cervical rotation.  Will try to accommodate patient for additional dry needling with exercise with subsequent visits     Eval - Patient is a pleasant 62 y.o.  gentleman who was seen today for physical therapy evaluation and treatment for chronic neck pain. Red flag questioning is overall reassuring although neuro exam is somewhat confounded by complex medical history (see above, includes CVA and multiple TIAs, reported crushing injury distal RLE several years ago). Pt notes these symptoms are similar to symptoms prior to last cervical fusion and are limiting his tolerance to daily and work activities. On exam pt demonstrates cervical and GH mobility deficits with corresponding muscular irritation of R LS/UT, deltoid and proximal  biceps. RUE numbness able to be resolved w/ pressure on R LS trigger point but returns with cessation of pressure. Increased time w/ history and exam given complex medical hx and symptom irritability, subsequently deferred HEP to next visit. No adverse events, report of muscular fatigue on departure but no overt increase in symptoms. Recommend skilled PT to address aforementioned deficits w/ aim of maximizing pt tolerance to daily activities. Pt departs today's session in no acute distress, all voiced questions/concerns addressed appropriately from PT perspective.     OBJECTIVE IMPAIRMENTS: decreased activity tolerance, decreased endurance, decreased mobility, decreased ROM, decreased strength, hypomobility, increased muscle spasms, impaired UE functional use, postural dysfunction, and pain.    ACTIVITY LIMITATIONS: carrying, lifting, bending, sleeping, and reach over head   PARTICIPATION LIMITATIONS: meal prep, cleaning, laundry, community activity, and occupation   PERSONAL FACTORS: Time since onset of injury/illness/exacerbation and 3+ comorbidities: HTN, CVA, cardiomyopathy, CKD3, DM2  are also affecting patient's functional outcome.    REHAB POTENTIAL: Fair given chronicity and comorbidities   CLINICAL DECISION MAKING: Evolving/moderate complexity   EVALUATION COMPLEXITY: Moderate     GOALS: Goals reviewed with patient? No  given time constraints   SHORT TERM GOALS: Target date: 02/16/2023 Pt will demonstrate appropriate understanding and performance of initially prescribed HEP in order to facilitate improved independence with management of symptoms.  Baseline: HEP TBD Goal status: INITIAL    2. Pt will score greater than or equal to 45 on FOTO in order to demonstrate improved perception of function due to symptoms.            Baseline: 37            Goal status: INITIAL     LONG TERM GOALS: Target date: 03/16/2023 Pt will score 52 on FOTO in order to demonstrate improved perception of function due to symptoms. Baseline: 37 Goal status: INITIAL   2. Pt will demonstrate at least 55 degrees of active cervical rotation to R in order to demonstrate improved environmental awareness and safety with driving.  Baseline: see ROM chart above Goal status: INITIAL   3. Pt will demonstrate at least 140deg of active R shoulder elevation in order to facilitate improved tolerance w/ household/work tasks Baseline: see ROM chart above Goal status: INITIAL    4. Pt will report/demonstrate ability to lift up to 10# with less than 2 point increase in resting pain on NPS in order to demonstrate improved tolerance to work activities. Baseline: NT due to symptom irritability Goal status: INITIAL      PLAN:   PT FREQUENCY: 1-2x/week   PT DURATION: 8 weeks   PLANNED INTERVENTIONS: Therapeutic exercises, Therapeutic activity, Neuromuscular re-education, Balance training, Gait training, Patient/Family education, Self Care, Joint mobilization, Vestibular training, Aquatic Therapy, Dry Needling, Electrical stimulation, Spinal mobilization, Cryotherapy, Moist heat, Taping, Manual therapy, and Re-evaluation   PLAN FOR NEXT SESSION: Manual PRN as indicated. Gentle cervical mobility, cervical/periscapular activation exercises as able/appropriate ***     Leeroy Cha PT, DPT 02/22/2023 11:56 AM

## 2023-02-23 ENCOUNTER — Ambulatory Visit: Payer: Medicare HMO | Admitting: Physical Therapy

## 2023-02-23 DIAGNOSIS — M542 Cervicalgia: Secondary | ICD-10-CM

## 2023-02-23 DIAGNOSIS — R293 Abnormal posture: Secondary | ICD-10-CM

## 2023-02-23 DIAGNOSIS — M6281 Muscle weakness (generalized): Secondary | ICD-10-CM

## 2023-02-23 NOTE — Therapy (Signed)
OUTPATIENT PHYSICAL THERAPY TREATMENT NOTE   Patient Name: Nicholas [name] MRN: [id number] DOB:1961-01-15, 62 y.o., male Today's Date: 02/23/2023  PCP: [name], MD   REFERRING PROVIDER: Leeanne Rio, MD  END OF SESSION:   PT End of Session - 02/23/23 1513     Visit Number 6    Number of Visits --   1-2 / week   Date for PT Re-Evaluation 03/16/23    Authorization Type [employer] medicare    Authorization Time Period 12 visits 01/25/23-03/13/23    Authorization - Visit Number 6    Authorization - Number of Visits 12    Progress Note Due on Visit 10    PT Start Time 1509    PT Stop Time [id number]    PT Time Calculation (min) 39 min    Activity Tolerance Patient limited by pain;No increased pain    Behavior During Therapy WFL for tasks assessed/performed                 Past Medical History:  Diagnosis Date   Asthma    Bipolar 1 disorder ([place])    Borderline glaucoma    Chronic pain    Epididymal pain    LEFT   Epilepsy, 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28---  LAST SEIZURE 1 WK AGO (APPROX ,  10-31-2013)   NO NEUROLOGIST---  PT SEES PCP  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   Feeling of incomplete bladder emptying    Frequency of urination    Gastric ulcer    GERD (gastroesophageal reflux disease)    Hypertension    Hyperthyroidism    NO MEDS    Migraine    Seizures (H[facility])    Stroke ([place])    TIA (transient ischemic attack)    Type 2 diabetes mellitus ([place])    Past Surgical History:  Procedure Laterality Date   ABDOMINAL SURGERY     ANTERIOR CERVICAL DECOMP/DISCECTOMY FUSION  2007   C4  --  C6   CERVICAL FUSION     CHOLECYSTECTOMY     COLONOSCOPY WITH PROPOFOL Left 11/02/2020   Procedure: COLONOSCOPY WITH PROPOFOL;  Surgeon: Arta Silence, MD;  Location: [facility];  Service: Endoscopy;  Laterality: Left;   CYSTOSCOPY N/A 11/09/2013   Procedure: CYSTOSCOPY FLEXIBLE;  Surgeon: Irine Seal, MD;  Location[facility];  Service: Urology;  Laterality: N/A;    EPIDIDYMECTOMY Left 11/09/2013   Procedure:  LEFT EPIDIDYMECTOMY;  Surgeon: Irine Seal, MD;  Location: [facility];  Service: Urology;  Laterality: Left;  POSSIBLE OUTPATIENT WITH OBSERVATION   EXCISION LIPOMA LEFT SHOULDER  2004 (APPROX)   MULTIPLE CYST REMOVED FROM CHEST  AGE 89   OTHER SURGICAL HISTORY     hemorroid surgery    TESTICLE REMOVAL Left    TONSILLECTOMY     Patient Active Problem List   Diagnosis Date Noted   Dizziness 01/02/2023   Bipolar 1 disorder ([place]) 07/01/2022   Hyperlipidemia 07/01/2022   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 A999333   Metabolic acidosis A999333   Palpitations 05/08/2022   Seizures ([place]) 12/03/2020   Rectal bleeding 10/31/2020   Cardiomyopathy ([place]) 09/23/2020   Atypical chest pain 09/23/2020   Precordial pain    CVA (cerebral vascular accident) ([place]) 09/02/2020   Seizure ([place]) 07/22/2020   Left-sided weakness 07/04/2020   Dysarthria 07/04/2020   Essential hypertension    Seizure disorder ([place]) 07/02/2017   Type 2 diabetes mellitus with diabetic neuropathy, with long-term current use of insulin and  hyperglycemia 07/02/2017   Stage 3a chronic kidney disease (CKD) ([place]) 07/02/2017   Normocytic anemia 07/02/2017   Testicular/scrotal pain 11/09/2013   Microhematuria 11/09/2013   Condyloma acuminatum of scrotum 11/09/2013    REFERRING DIAG: Z98.1 (ICD-10-CM) - History of fusion of cervical spine M54.2 (ICD-10-CM) - Neck pain  THERAPY DIAG:  Abnormal posture  Muscle weakness (generalized)  Cervicalgia  Rationale for Evaluation and Treatment Rehabilitation  PERTINENT HISTORY: HTN, CVA/TIA, cardiomyopathy, DM2, seizures, CKD3, bipolar 1, migraines, sleep apnea    PRECAUTIONS: seizure precautions, cardiac hx, cervical fusions (C4-T1 per pt), fall risk  SUBJECTIVE:                                                                                                                                                                                       SUBJECTIVE STATEMENT:   02/23/2023  I like the needling and I think is helps.  I was sleeping on the couch and I got a crick in my neck and I can't turn it Right.  My pain was a 10/10 and now it is a 8/10.  I feel like a sharp pain into the back of my shoulder. I tried not to lean over and try to have better posture.      PAIN:  Are you having pain: pt politely declines NPS on this date Location/description: L neck/shoulder with some referral into UE, describes as fatigued and spasms. Numbness described in radial distribution Best-worst over past week: 0-10/10  Per eval -  - aggravating factors: turning head to R, stretching, sleeping, bending, lifting, twisting - Easing factors: medication, TENS, biofreeze   OBJECTIVE: (objective measures completed at initial evaluation unless otherwise dated)   DIAGNOSTIC FINDINGS:  No recent cervical imaging CTH/MRI head and MRI lumbar spine July 2023, see epic for details   PATIENT SURVEYS:  FOTO 37 current, 52 predicted   COGNITION: Overall cognitive status: Within functional limits for tasks assessed although pt endorses mild memory issues since stroke   SENSATION/NEURO: Light touch intact all extremities but reduced on distal RLE, allodynia on C4-C7 (reports sensory changes after stroke and prior RLE injury) Finger<>nose testing mild incoordination RUE compared to L, no overt ataxia but intermittent dysmetria (pt attributes this to glaucoma issues) No clonus BIL Negative hoffmann and tromner sign BIL No ataxia with gait No aberrant eye movements or symptoms with saccades/smooth pursuits   POSTURE: significant forward head posture, trunk lean to L, increased L UT elevation   PALPATION: Concordant tenderness w/ trigger points R LS/UT, deltoid and biceps. Also able to resolve UE numbness w/ palpation of trigger point in R LS although returns immediately after cessation of pressure.  CERVICAL  ROM:    Active ROM A/PROM (deg) eval AROM 02-18-23   Flexion 50% *    Extension 75% s    Right lateral flexion      Left lateral flexion      Right rotation 40 deg * 49   Left rotation 60 deg  60    (Blank rows = not tested)   UPPER EXTREMITY ROM:   Active ROM Right eval Left eval  Shoulder flexion 110 * s  150 deg  Shoulder abduction      Shoulder internal rotation      Shoulder external rotation      Elbow flexion      Elbow extension      Wrist flexion      Wrist extension       (Blank rows = not tested) (Key: WFL = within functional limits not formally assessed, * = concordant pain, s = stiffness/stretching sensation, NT = not tested)  Comments:   UPPER EXTREMITY MMT:   MMT Right eval Left eval  Shoulder flexion      Shoulder extension      Shoulder abduction      Shoulder extension      Shoulder internal rotation      Shoulder external rotation      Elbow flexion      Elbow extension      Grip strength      (Blank rows = not tested)  (Key: WFL = within functional limits not formally assessed, * = concordant pain, s = stiffness/stretching sensation, NT = not tested)  Comments: deferred on eval given symptom irritability   CERVICAL SPECIAL TESTS:  Spurlings: + on R, mild discomfort on L  Sharps Purser: negative   FUNCTIONAL TESTS:  Overhead reach limited as above   TODAY'S TREATMENT:   [facility] Adult PT Treatment:                                                DATE: 02-23-23 Therapeutic Exercise: Post TPDN, lying on back with towel length wise for 5  min with UE overhead for increased extension Deep neck flexor stretch with towel roll and chin tuck 10 sec hold x 10 with VC and TC 15 # DB 3 x 10 Shoulder ER with scapular retraction-   3 x 10 reps Shoulder Extension with GTB Palms Forward  -  3 x 10 reps Star pattern  GTB with horizontal abd and diagonals 2 x 10 Thoracic extension with elbows on wall and flex knees. 2 x 10. Manual Therapy:  STW R upper  trap  and LS and scalenes and deltoid/ rhomboid With pt head over edge of mat,PT provides added extension with firm grip. Over flexing neck to max available range. UPA on RT over C- 3, 4 5, 6 Trigger Point Dry-Needling performed 02-23-23  by Voncille Lo Treatment instructions: Expect mild to moderate muscle soreness. S/S of pneumothorax if dry needled over a lung field, and to seek immediate medical attention should they occur. Patient verbalized understanding of these instructions and education. Patient Consent Given: Yes Education handout provided: Previously provided Muscles treated:  RT upper trap and RT levator, C 3, 4 ,5 RT cervical paraspinal,  Subocciptial RT Electrical stimulation performed: No Parameters: N/A Treatment response/outcome: twitch response noted, pt noted relief  [facility] Adult PT Treatment:  DATE: 02-17-22 Therapeutic Exercise: All exercises done in supine concurrently with moist hot pack Deep neck flexor stretch with towel roll and chin tuck 10 sec hold x 10 with VC and TC  Pt able to hold 40 sec supine deep flexion isometric at end Shoulder ER with scapular retraction-   3 x 10 reps Seated Scapular Retraction  x 10 with VC  Levator  Stretch  reps - 30sec hold x 2 Shoulder Extension with RTB Palms Forward  - 10 reps Manual Therapy:  STW R upper trap and scalenes and deltoid/ rhomboid STM and gentle trigger point release R Levator,  UT,  Trigger Point Dry-Needling performed 02-09-23  by Voncille Lo Treatment instructions: Expect mild to moderate muscle soreness. S/S of pneumothorax if dry needled over a lung field, and to seek immediate medical attention should they occur. Patient verbalized understanding of these instructions and education. Patient Consent Given: Yes Education handout provided: Previously provided Muscles treated:  RT upper trap and RT levator, C 3, 4 ,5 RT cervical paraspinal, R rhomboid, R  deltoid Electrical stimulation performed: No Parameters: N/A Treatment response/outcome: twitch response noted, pt noted relief Modalities  moist cervical hot pack  [facility] Adult PT Treatment:                                                DATE: 02/16/23 Manual Therapy: Seated: STM R LS, UT, rhomboid, infraspinatus, posterior deltoid; trigger point release LS and UT  Therapeutic Activity: Discussion re: activity modification, relevant anatomy/physiology as it pertains to activity, PT POC and rationale for interventions Pt politely declines exercise on this date  [facility] Adult PT Treatment:                                                DATE: 02-09-23 Therapeutic Exercise: Radial nerve glides, supine x10  no pain post TPDN Deep neck flexor stretch with towel roll and chin tuck 10 sec hold x 10 with VC and TC Seated Scapular Retraction  x 10 with VC  Levator  Stretch  reps - 30sec hold x 2 Shoulder ER with scapular retraction-  2 x 10 reps - Scapular Retraction with RTB 10 reps - Shoulder Extension with RTB Palms Forward  - 10 reps Manual Therapy:  STW R posterior shoulder, deltoid, tricep supine STM and gentle trigger point release R Levator UT,  Trigger Point Dry-Needling performed 02-09-23  by Voncille Lo Treatment instructions: Expect mild to moderate muscle soreness. S/S of pneumothorax if dry needled over a lung field, and to seek immediate medical attention should they occur. Patient verbalized understanding of these instructions and education.  Patient Consent Given: Yes Education handout provided: Previously provided Muscles treated:  RT upper trap and RT levator Electrical stimulation performed: No Parameters: N/A Treatment response/outcome: twitch response noted, pt noted relief  [facility] Adult PT Treatment:                                                DATE: 02/04/23 Therapeutic Exercise: Radial  nerve glides, seated x10  LS stretch towards L 3x30sec seated cues for setup and comfortable ROM  UT stretch towards L 2x30sec seated cues for comfortable ROM Cervical flexion stretch 2x30sec seated cues for comfortable ROM Scapular retraction x8 cues for reduced compensations, comfortable ROM  Manual Therapy: Very gentle STM R posterior shoulder, deltoid, tricep; seated STM and gentle trigger point release R LS, UT, and rhomboid; seated    PATIENT EDUCATION:  Education details: rationale for interventions, PT POC Person educated: Patient Education method: Explanation, Demonstration, Tactile cues, Verbal cues Education comprehension: verbalized understanding, returned demonstration, verbal cues required, tactile cues required, and needs further education     HOME EXERCISE PROGRAM: Access Code: [email or web address] URL: [email or web address] Date: 02/23/2023 Prepared by: Voncille Lo  Exercises - Supine Cervical Retraction with Towel  - 1 x daily - 7 x weekly - 1 sets - 10 reps - Seated Scapular Retraction  - 1 x daily - 7 x weekly - 2 sets - 8 reps - Gentle Levator Scapulae Stretch  - 1 x daily - 7 x weekly - 1 sets - 3 reps - 30sec hold - Shoulder External Rotation and Scapular Retraction with Resistance  - 2 x daily - 7 x weekly - 3 sets - 10 reps - Scapular Retraction with Resistance  - 2 x daily - 7 x weekly - 3 sets - 10 reps - Shoulder Extension with Resistance - Palms Forward  - 1 x daily - 7 x weekly - 3 sets - 10 reps - Seated Assisted Cervical Rotation with Towel  - 1 x daily - 7 x weekly - 1 sets - 3 reps - 10-15 sec hold - Standing Thoracic Extension at Wall  - 1 x daily - 7 x weekly - 1 sets - 5 reps - 5-10 sec for good pull hold ASSESSMENT:   CLINICAL IMPRESSION:Pt reo 02/23/2023 Pt arrives with report of 10/10 pain from crick in neck after sleeping on couch.  Pt today is 8/10 and reduced to 4/10 at end of session of TPDN and manual and there ex. Pt reports doing  exercises at home and understands importance that was taught in previous sessions. Pt reponds well today of TPDN followed by exercise and thoracic extension.  Pt consented positively to TPDN and was closely monitored throughout session.  Pt with twitch response and palpable lenghthening.  Pt needs to reinforce HEP and HEP updated today for self cervical rotation with towel and thoracic extension for home.  Eval - Patient is a pleasant 62 y.o. gentleman who was seen today for physical therapy evaluation and treatment for chronic neck pain. Red flag questioning is overall reassuring although neuro exam is somewhat confounded by complex medical history (see above, includes CVA and multiple TIAs, reported crushing injury distal RLE several years ago). Pt notes these symptoms are similar to symptoms prior to last cervical fusion and are limiting his tolerance to daily and work activities. On exam pt demonstrates cervical and GH mobility deficits with corresponding muscular irritation of R LS/UT, deltoid and proximal biceps. RUE numbness able to be resolved w/ pressure on R LS trigger point but returns with cessation of pressure. Increased  time w/ history and exam given complex medical hx and symptom irritability, subsequently deferred HEP to next visit. No adverse events, report of muscular fatigue on departure but no overt increase in symptoms. Recommend skilled PT to address aforementioned deficits w/ aim of maximizing pt tolerance to daily activities. Pt departs today's session in no acute distress, all voiced questions/concerns addressed appropriately from PT perspective.     OBJECTIVE IMPAIRMENTS: decreased activity tolerance, decreased endurance, decreased mobility, decreased ROM, decreased strength, hypomobility, increased muscle spasms, impaired UE functional use, postural dysfunction, and pain.    ACTIVITY LIMITATIONS: carrying, lifting, bending, sleeping, and reach over head   PARTICIPATION LIMITATIONS:  meal prep, cleaning, laundry, community activity, and occupation   PERSONAL FACTORS: Time since onset of injury/illness/exacerbation and 3+ comorbidities: HTN, CVA, cardiomyopathy, CKD3, DM2  are also affecting patient's functional outcome.    REHAB POTENTIAL: Fair given chronicity and comorbidities   CLINICAL DECISION MAKING: Evolving/moderate complexity   EVALUATION COMPLEXITY: Moderate     GOALS: Goals reviewed with patient? No given time constraints   SHORT TERM GOALS: Target date: 02/16/2023 Pt will demonstrate appropriate understanding and performance of initially prescribed HEP in order to facilitate improved independence with management of symptoms.  Baseline: HEP TBD Goal status: INITIAL    2. Pt will score greater than or equal to 45 on FOTO in order to demonstrate improved perception of function due to symptoms.            Baseline: 37            Goal status: INITIAL     LONG TERM GOALS: Target date: 03/16/2023 Pt will score 52 on FOTO in order to demonstrate improved perception of function due to symptoms. Baseline: 37 Goal status: INITIAL   2. Pt will demonstrate at least 55 degrees of active cervical rotation to R in order to demonstrate improved environmental awareness and safety with driving.  Baseline: see ROM chart above Goal status: INITIAL   3. Pt will demonstrate at least 140deg of active R shoulder elevation in order to facilitate improved tolerance w/ household/work tasks Baseline: see ROM chart above Goal status: INITIAL    4. Pt will report/demonstrate ability to lift up to 10# with less than 2 point increase in resting pain on NPS in order to demonstrate improved tolerance to work activities. Baseline: NT due to symptom irritability Goal status: INITIAL      PLAN:   PT FREQUENCY: 1-2x/week   PT DURATION: 8 weeks   PLANNED INTERVENTIONS: Therapeutic exercises, Therapeutic activity, Neuromuscular re-education, Balance training, Gait training,  Patient/Family education, Self Care, Joint mobilization, Vestibular training, Aquatic Therapy, Dry Needling, Electrical stimulation, Spinal mobilization, Cryotherapy, Moist heat, Taping, Manual therapy, and Re-evaluation   PLAN FOR NEXT SESSION: Manual PRN as indicated. Gentle cervical mobility, cervical/periscapular activation exercises as able/appropriate     Voncille Lo, PT, [place] Certified Exercise Expert for the Aging Adult  02/23/23 4:54 PM Phone: [phone] Fax: [phone]

## 2023-02-25 ENCOUNTER — Ambulatory Visit: Payer: Medicare HMO | Admitting: Physical Therapy

## 2023-02-25 ENCOUNTER — Encounter: Payer: Self-pay | Admitting: Physical Therapy

## 2023-02-25 DIAGNOSIS — R293 Abnormal posture: Secondary | ICD-10-CM | POA: Diagnosis not present

## 2023-02-25 DIAGNOSIS — M542 Cervicalgia: Secondary | ICD-10-CM | POA: Diagnosis not present

## 2023-02-25 DIAGNOSIS — M6281 Muscle weakness (generalized): Secondary | ICD-10-CM | POA: Diagnosis not present

## 2023-02-25 NOTE — Therapy (Signed)
OUTPATIENT PHYSICAL THERAPY TREATMENT NOTE   Patient Name: Nicholas [name] MRN: [id number] DOB:May 27, 1961, 62 y.o., male Today's Date: 02/25/2023 6 PCP: [name], MD   REFERRING PROVIDER: Leeanne Rio, MD  END OF SESSION:   PT End of Session - 02/25/23 1450     Visit Number 7    Date for PT Re-Evaluation 03/16/23    Authorization Type [employer] medicare    Authorization Time Period 12 visits 01/25/23-03/13/23    Authorization - Visit Number 7    Authorization - Number of Visits 12    Progress Note Due on Visit 10    PT Start Time [id number]    PT Stop Time 1526    PT Time Calculation (min) 38 min                 Past Medical History:  Diagnosis Date   Asthma    Bipolar 1 disorder ([place])    Borderline glaucoma    Chronic pain    Epididymal pain    LEFT   Epilepsy, 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37---  LAST SEIZURE 1 WK AGO (APPROX ,  10-31-2013)   NO NEUROLOGIST---  PT SEES PCP  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   Feeling of incomplete bladder emptying    Frequency of urination    Gastric ulcer    GERD (gastroesophageal reflux disease)    Hypertension    Hyperthyroidism    NO MEDS    Migraine    Seizures ([place])    Stroke ([place])    TIA (transient ischemic attack)    Type 2 diabetes mellitus ([place])    Past Surgical History:  Procedure Laterality Date   ABDOMINAL SURGERY     ANTERIOR CERVICAL DECOMP/DISCECTOMY FUSION  2007   C4  --  C6   CERVICAL FUSION     CHOLECYSTECTOMY     COLONOSCOPY WITH PROPOFOL Left 11/02/2020   Procedure: COLONOSCOPY WITH PROPOFOL;  Surgeon: Arta Silence, MD;  Location: [facility];  Service: Endoscopy;  Laterality: Left;   CYSTOSCOPY N/A 11/09/2013   Procedure: CYSTOSCOPY FLEXIBLE;  Surgeon: Irine Seal, MD;  Location: [facility];  Service: Urology;  Laterality: N/A;   EPIDIDYMECTOMY Left 11/09/2013   Procedure:  LEFT EPIDIDYMECTOMY;  Surgeon: Irine Seal, MD;  Location: [facility];  Service: Urology;  Laterality: Left;   POSSIBLE OUTPATIENT WITH OBSERVATION   EXCISION LIPOMA LEFT SHOULDER  2004 (APPROX)   MULTIPLE CYST REMOVED FROM CHEST  AGE 37   OTHER SURGICAL HISTORY     hemorroid surgery    TESTICLE REMOVAL Left    TONSILLECTOMY     Patient Active Problem List   Diagnosis Date Noted   Dizziness 01/02/2023   Bipolar 1 disorder ([place]) 07/01/2022   Hyperlipidemia 07/01/2022   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 [id number]   Metabolic acidosis A999333   Palpitations 05/08/2022   Seizures ([place]) 12/03/2020   Rectal bleeding 10/31/2020   Cardiomyopathy ([place]) 09/23/2020   Atypical chest pain 09/23/2020   Precordial pain    CVA (cerebral vascular accident) ([place]) 09/02/2020   Seizure ([place]) 07/22/2020   Left-sided weakness 07/04/2020   Dysarthria 07/04/2020   Essential hypertension    Seizure disorder ([place]) 07/02/2017   Type 2 diabetes mellitus with diabetic neuropathy, with long-term current use of insulin and hyperglycemia 07/02/2017   Stage 3a chronic kidney disease (CKD) ([place]) 07/02/2017   Normocytic anemia 07/02/2017   Testicular/scrotal pain 11/09/2013   Microhematuria 11/09/2013   Condyloma acuminatum of scrotum  11/09/2013    REFERRING DIAG: Z98.1 (ICD-10-CM) - History of fusion of cervical spine M54.2 (ICD-10-CM) - Neck pain  THERAPY DIAG:  Abnormal posture  Muscle weakness (generalized)  Cervicalgia  Rationale for Evaluation and Treatment Rehabilitation  PERTINENT HISTORY: HTN, CVA/TIA, cardiomyopathy, DM2, seizures, CKD3, bipolar 1, migraines, sleep apnea    PRECAUTIONS: seizure precautions, cardiac hx, cervical fusions (C4-T1 per pt), fall risk  SUBJECTIVE:                                                                                                                                                                                      SUBJECTIVE STATEMENT:   02/25/2023  The right shoulder started bothering me last night. It woke me up. Not sure why.      PAIN:   Are you having pain: Yes : 7/10 NPS on this date Location/description: L neck/shoulder with some referral into UE, describes as fatigued and spasms. Numbness described in radial distribution Best-worst over past week: 0-10/10  Per eval -  - aggravating factors: turning head to R, stretching, sleeping, bending, lifting, twisting - Easing factors: medication, TENS, biofreeze   OBJECTIVE: (objective measures completed at initial evaluation unless otherwise dated)   DIAGNOSTIC FINDINGS:  No recent cervical imaging CTH/MRI head and MRI lumbar spine July 2023, see epic for details   PATIENT SURVEYS:  FOTO 37 current, 52 predicted   COGNITION: Overall cognitive status: Within functional limits for tasks assessed although pt endorses mild memory issues since stroke   SENSATION/NEURO: Light touch intact all extremities but reduced on distal RLE, allodynia on C4-C7 (reports sensory changes after stroke and prior RLE injury) Finger<>nose testing mild incoordination RUE compared to L, no overt ataxia but intermittent dysmetria (pt attributes this to glaucoma issues) No clonus BIL Negative hoffmann and tromner sign BIL No ataxia with gait No aberrant eye movements or symptoms with saccades/smooth pursuits   POSTURE: significant forward head posture, trunk lean to L, increased L UT elevation   PALPATION: Concordant tenderness w/ trigger points R LS/UT, deltoid and biceps. Also able to resolve UE numbness w/ palpation of trigger point in R LS although returns immediately after cessation of pressure.              CERVICAL ROM:    Active ROM A/PROM (deg) eval AROM 02-18-23   Flexion 50% *    Extension 75% s    Right lateral flexion      Left lateral flexion      Right rotation 40 deg * 49   Left rotation 60 deg  60    (Blank rows = not tested)   UPPER EXTREMITY ROM:  Active ROM Right eval Left eval Right  02/25/23   Shoulder flexion 110 * s  150 deg 130   Shoulder abduction         Shoulder internal rotation     L5   Shoulder external rotation     T2    Elbow flexion        Elbow extension        Wrist flexion        Wrist extension         (Blank rows = not tested) (Key: WFL = within functional limits not formally assessed, * = concordant pain, s = stiffness/stretching sensation, NT = not tested)  Comments:   UPPER EXTREMITY MMT:   MMT Right eval Left eval  Shoulder flexion      Shoulder extension      Shoulder abduction      Shoulder extension      Shoulder internal rotation      Shoulder external rotation      Elbow flexion      Elbow extension      Grip strength      (Blank rows = not tested)  (Key: WFL = within functional limits not formally assessed, * = concordant pain, s = stiffness/stretching sensation, NT = not tested)  Comments: deferred on eval given symptom irritability   CERVICAL SPECIAL TESTS:  Spurlings: + on R, mild discomfort on L  Sharps Purser: negative   FUNCTIONAL TESTS:  Overhead reach limited as above   TODAY'S TREATMENT:   [facility] Adult PT Treatment:                                                DATE: 02/25/23 Therapeutic Exercise: Supine GTB star patten  Supine shoulder  ER green TB Rhomboid stretch on doorknobs  Single arm rhomboid stretch in doorway  Manual Therapy: Left shoulder GHJ mobilizations followed by PROM  Sidelying scap mobs and STW along medial border of scap      [facility] Adult PT Treatment:                                                DATE: 02-23-23 Therapeutic Exercise: Post TPDN, lying on back with towel length wise for 5  min with UE overhead for increased extension Deep neck flexor stretch with towel roll and chin tuck 10 sec hold x 10 with VC and TC 15 # DB 3 x 10  Shoulder ER with scapular retraction-   3 x 10 reps Shoulder Extension with GTB Palms Forward  -  3 x 10 reps Star pattern  GTB with horizontal abd and diagonals 2 x 10 Thoracic extension with elbows on wall and flex knees. 2 x  10. Manual Therapy:  STW R upper trap  and LS and scalenes and deltoid/ rhomboid With pt head over edge of mat,PT provides added extension with firm grip. Over flexing neck to max available range. UPA on RT over C- 3, 4 5, 6 Trigger Point Dry-Needling performed 02-23-23  by Voncille Lo Treatment instructions: Expect mild to moderate muscle soreness. S/S of pneumothorax if dry needled over a lung field, and to seek immediate medical attention should they occur. Patient verbalized understanding  of these instructions and education. Patient Consent Given: Yes Education handout provided: Previously provided Muscles treated:  RT upper trap and RT levator, C 3, 4 ,5 RT cervical paraspinal,  Subocciptial RT Electrical stimulation performed: No Parameters: N/A Treatment response/outcome: twitch response noted, pt noted relief  [facility] Adult PT Treatment:                                                DATE: 02-17-22 Therapeutic Exercise: All exercises done in supine concurrently with moist hot pack Deep neck flexor stretch with towel roll and chin tuck 10 sec hold x 10 with VC and TC  Pt able to hold 40 sec supine deep flexion isometric at end Shoulder ER with scapular retraction-   3 x 10 reps Seated Scapular Retraction  x 10 with VC  Levator  Stretch  reps - 30sec hold x 2 Shoulder Extension with RTB Palms Forward  - 10 reps Manual Therapy:  STW R upper trap and scalenes and deltoid/ rhomboid STM and gentle trigger point release R Levator,  UT,  Trigger Point Dry-Needling performed 02-09-23  by Voncille Lo Treatment instructions: Expect mild to moderate muscle soreness. S/S of pneumothorax if dry needled over a lung field, and to seek immediate medical attention should they occur. Patient verbalized understanding of these instructions and education. Patient Consent Given: Yes Education handout provided: Previously provided Muscles treated:  RT upper trap and RT levator, C 3, 4 ,5 RT cervical  paraspinal, R rhomboid, R deltoid Electrical stimulation performed: No Parameters: N/A Treatment response/outcome: twitch response noted, pt noted relief Modalities  moist cervical hot pack  [facility] Adult PT Treatment:                                                DATE: 02/16/23 Manual Therapy: Seated: STM R LS, UT, rhomboid, infraspinatus, posterior deltoid; trigger point release LS and UT  Therapeutic Activity: Discussion re: activity modification, relevant anatomy/physiology as it pertains to activity, PT POC and rationale for interventions Pt politely declines exercise on this date  [facility] Adult PT Treatment:                                                DATE: 02-09-23 Therapeutic Exercise: Radial nerve glides, supine x10  no pain post TPDN Deep neck flexor stretch with towel roll and chin tuck 10 sec hold x 10 with VC and TC Seated Scapular Retraction  x 10 with VC  Levator  Stretch  reps - 30sec hold x 2 Shoulder ER with scapular retraction-  2 x 10 reps - Scapular Retraction with RTB 10 reps - Shoulder Extension with RTB Palms Forward  - 10 reps Manual Therapy:  STW R posterior shoulder, deltoid, tricep supine STM and gentle trigger point release R Levator UT,  Trigger Point Dry-Needling performed 02-09-23  by Voncille Lo Treatment instructions: Expect mild to moderate muscle soreness. S/S of pneumothorax if dry needled over a lung field, and to seek immediate medical attention should they occur. Patient verbalized understanding of these instructions and education.  Patient Consent Given:  Yes Education handout provided: Previously provided Muscles treated:  RT upper trap and RT levator Electrical stimulation performed: No Parameters: N/A Treatment response/outcome: twitch response noted, pt noted relief                                                                                     [facility] Adult PT Treatment:                                                DATE:  02/04/23 Therapeutic Exercise: Radial nerve glides, seated x10  LS stretch towards L 3x30sec seated cues for setup and comfortable ROM  UT stretch towards L 2x30sec seated cues for comfortable ROM Cervical flexion stretch 2x30sec seated cues for comfortable ROM Scapular retraction x8 cues for reduced compensations, comfortable ROM  Manual Therapy: Very gentle STM R posterior shoulder, deltoid, tricep; seated STM and gentle trigger point release R LS, UT, and rhomboid; seated    PATIENT EDUCATION:  Education details: rationale for interventions, PT POC Person educated: Patient Education method: Explanation, Demonstration, Tactile cues, Verbal cues Education comprehension: verbalized understanding, returned demonstration, verbal cues required, tactile cues required, and needs further education     HOME EXERCISE PROGRAM: Access Code: [email or web address] URL: [email or web address] Date: 02/23/2023 Prepared by: Voncille Lo  Exercises - Supine Cervical Retraction with Towel  - 1 x daily - 7 x weekly - 1 sets - 10 reps - Seated Scapular Retraction  - 1 x daily - 7 x weekly - 2 sets - 8 reps - Gentle Levator Scapulae Stretch  - 1 x daily - 7 x weekly - 1 sets - 3 reps - 30sec hold - Shoulder External Rotation and Scapular Retraction with Resistance  - 2 x daily - 7 x weekly - 3 sets - 10 reps - Scapular Retraction with Resistance  - 2 x daily - 7 x weekly - 3 sets - 10 reps - Shoulder Extension with Resistance - Palms Forward  - 1 x daily - 7 x weekly - 3 sets - 10 reps - Seated Assisted Cervical Rotation with Towel  - 1 x daily - 7 x weekly - 1 sets - 3 reps - 10-15 sec hold - Standing Thoracic Extension at Wall  - 1 x daily - 7 x weekly - 1 sets - 5 reps - 5-10 sec for good pull hold ASSESSMENT:   CLINICAL IMPRESSION:Pt reo 02/25/2023 Pt arrives with report of 7/10 pain  in posterior shoulder with onset yesterday evening while sleeping. AROM decreased for right shoulder.  Performed manual therapy for GHJ mobilization, scap mobs, PROM and TPR along inferior scap and medial border. After ward pt AROM was [facility].   Eval - Patient is a pleasant 62 y.o. gentleman who was seen today for physical therapy evaluation and treatment for chronic neck pain. Red flag questioning is overall reassuring although neuro exam is somewhat confounded by complex medical history (see above, includes CVA and multiple TIAs, reported crushing injury distal RLE several years ago). Pt notes these symptoms are similar to  symptoms prior to last cervical fusion and are limiting his tolerance to daily and work activities. On exam pt demonstrates cervical and GH mobility deficits with corresponding muscular irritation of R LS/UT, deltoid and proximal biceps. RUE numbness able to be resolved w/ pressure on R LS trigger point but returns with cessation of pressure. Increased time w/ history and exam given complex medical hx and symptom irritability, subsequently deferred HEP to next visit. No adverse events, report of muscular fatigue on departure but no overt increase in symptoms. Recommend skilled PT to address aforementioned deficits w/ aim of maximizing pt tolerance to daily activities. Pt departs today's session in no acute distress, all voiced questions/concerns addressed appropriately from PT perspective.     OBJECTIVE IMPAIRMENTS: decreased activity tolerance, decreased endurance, decreased mobility, decreased ROM, decreased strength, hypomobility, increased muscle spasms, impaired UE functional use, postural dysfunction, and pain.    ACTIVITY LIMITATIONS: carrying, lifting, bending, sleeping, and reach over head   PARTICIPATION LIMITATIONS: meal prep, cleaning, laundry, community activity, and occupation   PERSONAL FACTORS: Time since onset of injury/illness/exacerbation and 3+ comorbidities: HTN, CVA, cardiomyopathy, CKD3, DM2  are also affecting patient's functional outcome.    REHAB POTENTIAL:  Fair given chronicity and comorbidities   CLINICAL DECISION MAKING: Evolving/moderate complexity   EVALUATION COMPLEXITY: Moderate     GOALS: Goals reviewed with patient? No given time constraints   SHORT TERM GOALS: Target date: 02/16/2023 Pt will demonstrate appropriate understanding and performance of initially prescribed HEP in order to facilitate improved independence with management of symptoms.  Baseline: HEP TBD Goal status: INITIAL    2. Pt will score greater than or equal to 45 on FOTO in order to demonstrate improved perception of function due to symptoms.            Baseline: 37            Goal status: INITIAL     LONG TERM GOALS: Target date: 03/16/2023 Pt will score 52 on FOTO in order to demonstrate improved perception of function due to symptoms. Baseline: 37 Goal status: INITIAL   2. Pt will demonstrate at least 55 degrees of active cervical rotation to R in order to demonstrate improved environmental awareness and safety with driving.  Baseline: see ROM chart above Goal status: INITIAL   3. Pt will demonstrate at least 140deg of active R shoulder elevation in order to facilitate improved tolerance w/ household/work tasks Baseline: see ROM chart above Goal status: INITIAL    4. Pt will report/demonstrate ability to lift up to 10# with less than 2 point increase in resting pain on NPS in order to demonstrate improved tolerance to work activities. Baseline: NT due to symptom irritability Goal status: INITIAL      PLAN:   PT FREQUENCY: 1-2x/week   PT DURATION: 8 weeks   PLANNED INTERVENTIONS: Therapeutic exercises, Therapeutic activity, Neuromuscular re-education, Balance training, Gait training, Patient/Family education, Self Care, Joint mobilization, Vestibular training, Aquatic Therapy, Dry Needling, Electrical stimulation, Spinal mobilization, Cryotherapy, Moist heat, Taping, Manual therapy, and Re-evaluation   PLAN FOR NEXT SESSION: Manual PRN as  indicated. Gentle cervical mobility, cervical/periscapular activation exercises as able/appropriate     Hessie Diener, PTA 02/25/23 3:36 PM Phone: [phone] Fax: [phone]

## 2023-03-01 NOTE — Therapy (Signed)
OUTPATIENT PHYSICAL THERAPY TREATMENT NOTE   Patient Name: Nicholas [name] MRN: [id number] DOB:July 24, 1961, 62 y.o., male Today's Date: 03/02/2023 6 PCP: [name], MD   REFERRING PROVIDER: Leeanne Rio, MD  END OF SESSION:   PT End of Session - 03/02/23 1515     Visit Number 8    Number of Visits --   1-2x/week   Date for PT Re-Evaluation 03/16/23    Authorization Type human[employer] medicare    Authorization Time Period 12 visits 01/25/23-03/13/23    Authorization - Visit Number 8    Authorization - Number of Visits 12    PT Start Time [id number]   late check in   PT Stop Time 1545    PT Time Calculation (min) 28 min    Activity Tolerance Patient limited by pain;No increased pain;Patient tolerated treatment well    Behavior During Therapy WFL for tasks assessed/performed             Past Medical History:  Diagnosis Date   Asthma    Bipolar 1 disorder ([place])    Borderline glaucoma    Chronic pain    Epididymal pain    LEFT   Epilepsy, 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37---  LAST SEIZURE 1 WK AGO (APPROX ,  10-31-2013)   NO NEUROLOGIST---  PT SEES PCP  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   Feeling of incomplete bladder emptying    Frequency of urination    Gastric ulcer    GERD (gastroesophageal reflux disease)    Hypertension    Hyperthyroidism    NO MEDS    Migraine    Seizures (HCC)    Stroke ([place])    TIA (transient ischemic attack)    Type 2 diabetes mellitus ([place])    Past Surgical History:  Procedure Laterality Date   ABDOMINAL SURGERY     ANTERIOR CERVICAL DECOMP/DISCECTOMY FUSION  2007   C4  --  C6   CERVICAL FUSION     CHOLECYSTECTOMY     COLONOSCOPY WITH PROPOFOL Left 11/02/2020   Procedure: COLONOSCOPY WITH PROPOFOL;  Surgeon: Arta Silence, MD;  Location: [place];  Service: Endoscopy;  Laterality: Left;   CYSTOSCOPY N/A 11/09/2013   Procedure: CYSTOSCOPY FLEXIBLE;  Surgeon: Irine Seal, MD;  Location: [facility];  Service: Urology;  Laterality: N/A;    EPIDIDYMECTOMY Left 11/09/2013   Procedure:  LEFT EPIDIDYMECTOMY;  Surgeon: Irine Seal, MD;  Location: [facility];  Service: Urology;  Laterality: Left;  POSSIBLE OUTPATIENT WITH OBSERVATION   EXCISION LIPOMA LEFT SHOULDER  2004 (APPROX)   MULTIPLE CYST REMOVED FROM CHEST  AGE 46   OTHER SURGICAL HISTORY     hemorroid surgery    TESTICLE REMOVAL Left    TONSILLECTOMY     Patient Active Problem List   Diagnosis Date Noted   Dizziness 01/02/2023   Bipolar 1 disorder ([place]) 07/01/2022   Hyperlipidemia 07/01/2022   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 [id number]   Metabolic acidosis A999333   Palpitations 05/08/2022   Seizures ([place]) 12/03/2020   Rectal bleeding 10/31/2020   Cardiomyopathy ([place]) 09/23/2020   Atypical chest pain 09/23/2020   Precordial pain    CVA (cerebral vascular accident) ([place]) 09/02/2020   Seizure ([place]) 07/22/2020   Left-sided weakness 07/04/2020   Dysarthria 07/04/2020   Essential hypertension    Seizure disorder ([place]) 07/02/2017   Type 2 diabetes mellitus with diabetic neuropathy, with long-term current use of insulin and hyperglycemia 07/02/2017   Stage 3a chronic kidney  disease (CKD) ([place]) 07/02/2017   Normocytic anemia 07/02/2017   Testicular/scrotal pain 11/09/2013   Microhematuria 11/09/2013   Condyloma acuminatum of scrotum 11/09/2013    REFERRING DIAG: Z98.1 (ICD-10-CM) - History of fusion of cervical spine M54.2 (ICD-10-CM) - Neck pain  THERAPY DIAG:  Abnormal posture  Muscle weakness (generalized)  Cervicalgia  Rationale for Evaluation and Treatment Rehabilitation  PERTINENT HISTORY: HTN, CVA/TIA, cardiomyopathy, DM2, seizures, CKD3, bipolar 1, migraines, sleep apnea    PRECAUTIONS: seizure precautions, cardiac hx, cervical fusions (C4-T1 per pt), fall risk  SUBJECTIVE:                                                                                                                                                                                       SUBJECTIVE STATEMENT:   03/02/2023  Pt states that his shoulder is bothering him over the weekend but felt great when he left last session. Feels manual is very helpful but symptoms return after a couple days of being pain free. Felt he woke up with a "crook in my neck" a couple days ago, feels his sleeping position is contributing.    PAIN:  Are you having pain: Yes : 7/10 NPS on this date Location/description: L neck/shoulder with some referral into UE, describes as fatigued and spasms. Numbness described in radial distribution Best-worst over past week: 0-10/10  Per eval -  - aggravating factors: turning head to R, stretching, sleeping, bending, lifting, twisting - Easing factors: medication, TENS, biofreeze   OBJECTIVE: (objective measures completed at initial evaluation unless otherwise dated)   DIAGNOSTIC FINDINGS:  No recent cervical imaging CTH/MRI head and MRI lumbar spine July 2023, see epic for details   PATIENT SURVEYS:  FOTO 37 current, 52 predicted   COGNITION: Overall cognitive status: Within functional limits for tasks assessed although pt endorses mild memory issues since stroke   SENSATION/NEURO: Light touch intact all extremities but reduced on distal RLE, allodynia on C4-C7 (reports sensory changes after stroke and prior RLE injury) Finger<>nose testing mild incoordination RUE compared to L, no overt ataxia but intermittent dysmetria (pt attributes this to glaucoma issues) No clonus BIL Negative hoffmann and tromner sign BIL No ataxia with gait No aberrant eye movements or symptoms with saccades/smooth pursuits   POSTURE: significant forward head posture, trunk lean to L, increased L UT elevation   PALPATION: Concordant tenderness w/ trigger points R LS/UT, deltoid and biceps. Also able to resolve UE numbness w/ palpation of trigger point in R LS although returns immediately after cessation of pressure.               CERVICAL ROM:    Active ROM A/PROM (  deg) eval AROM 02-18-23 AROM 03/02/23  Flexion 50% *    Extension 75% s    Right lateral flexion      Left lateral flexion      Right rotation 40 deg * 49 45 deg  Left rotation 60 deg  60 60 deg   (Blank rows = not tested)   UPPER EXTREMITY ROM:   Active ROM Right eval Left eval Right  02/25/23 03/02/23 AROM   Shoulder flexion 110 * s  150 deg 130 130 deg pre treatment 140 deg post manual    Shoulder abduction        Shoulder internal rotation     L5   Shoulder external rotation     T2  Spine scapula  Elbow flexion        Elbow extension        Wrist flexion        Wrist extension         (Blank rows = not tested) (Key: WFL = within functional limits not formally assessed, * = concordant pain, s = stiffness/stretching sensation, NT = not tested)  Comments:   UPPER EXTREMITY MMT:   MMT Right eval Left eval  Shoulder flexion      Shoulder extension      Shoulder abduction      Shoulder extension      Shoulder internal rotation      Shoulder external rotation      Elbow flexion      Elbow extension      Grip strength      (Blank rows = not tested)  (Key: WFL = within functional limits not formally assessed, * = concordant pain, s = stiffness/stretching sensation, NT = not tested)  Comments: deferred on eval given symptom irritability   CERVICAL SPECIAL TESTS:  Spurlings: + on R, mild discomfort on L  Sharps Purser: negative   FUNCTIONAL TESTS:  Overhead reach limited as above   TODAY'S TREATMENT:   [facility] Adult PT Treatment:                                                DATE: 03/02/23 Therapeutic Exercise: Supine OH flexion w dowel x10 cues for setup Supine cervical rotation BIL x15 cues for appropriate ROM Doorway rhomboid stretch 3x30sec cues for setup  Manual Therapy: Sidelying; STM R rhomboid, mid trap, post delt, infraspinatus, teres, lat. Scapulothoracic mobs all directions to pt tolerance    [facility] Adult PT  Treatment:                                                DATE: 02/25/23 Therapeutic Exercise: Supine GTB star patten  Supine shoulder  ER green TB Rhomboid stretch on doorknobs  Single arm rhomboid stretch in doorway  Manual Therapy: Left shoulder GHJ mobilizations followed by PROM  Sidelying scap mobs and STW along medial border of scap    [facility] Adult PT Treatment:                                                DATE:  02-23-23 Therapeutic Exercise: Post TPDN, lying on back with towel length wise for 5  min with UE overhead for increased extension Deep neck flexor stretch with towel roll and chin tuck 10 sec hold x 10 with VC and TC 15 # DB 3 x 10  Shoulder ER with scapular retraction-   3 x 10 reps Shoulder Extension with GTB Palms Forward  -  3 x 10 reps Star pattern  GTB with horizontal abd and diagonals 2 x 10 Thoracic extension with elbows on wall and flex knees. 2 x 10. Manual Therapy:  STW R upper trap  and LS and scalenes and deltoid/ rhomboid With pt head over edge of mat,PT provides added extension with firm grip. Over flexing neck to max available range. UPA on RT over C- 3, 4 5, 6 Trigger Point Dry-Needling performed 02-23-23  by Voncille Lo Treatment instructions: Expect mild to moderate muscle soreness. S/S of pneumothorax if dry needled over a lung field, and to seek immediate medical attention should they occur. Patient verbalized understanding of these instructions and education. Patient Consent Given: Yes Education handout provided: Previously provided Muscles treated:  RT upper trap and RT levator, C 3, 4 ,5 RT cervical paraspinal,  Subocciptial RT Electrical stimulation performed: No Parameters: N/A Treatment response/outcome: twitch response noted, pt noted relief  [facility] Adult PT Treatment:                                                DATE: 02-17-22 Therapeutic Exercise: All exercises done in supine concurrently with moist hot pack Deep neck flexor stretch  with towel roll and chin tuck 10 sec hold x 10 with VC and TC  Pt able to hold 40 sec supine deep flexion isometric at end Shoulder ER with scapular retraction-   3 x 10 reps Seated Scapular Retraction  x 10 with VC  Levator  Stretch  reps - 30sec hold x 2 Shoulder Extension with RTB Palms Forward  - 10 reps Manual Therapy:  STW R upper trap and scalenes and deltoid/ rhomboid STM and gentle trigger point release R Levator,  UT,  Trigger Point Dry-Needling performed 02-09-23  by Voncille Lo Treatment instructions: Expect mild to moderate muscle soreness. S/S of pneumothorax if dry needled over a lung field, and to seek immediate medical attention should they occur. Patient verbalized understanding of these instructions and education. Patient Consent Given: Yes Education handout provided: Previously provided Muscles treated:  RT upper trap and RT levator, C 3, 4 ,5 RT cervical paraspinal, R rhomboid, R deltoid Electrical stimulation performed: No Parameters: N/A Treatment response/outcome: twitch response noted, pt noted relief Modalities  moist cervical hot pack  [facility] Adult PT Treatment:                                                DATE: 02/16/23 Manual Therapy: Seated: STM R LS, UT, rhomboid, infraspinatus, posterior deltoid; trigger point release LS and UT  Therapeutic Activity: Discussion re: activity modification, relevant anatomy/physiology as it pertains to activity, PT POC and rationale for interventions Pt politely declines exercise on this date  [facility] Adult PT Treatment:  DATE: 02-09-23 Therapeutic Exercise: Radial nerve glides, supine x10  no pain post TPDN Deep neck flexor stretch with towel roll and chin tuck 10 sec hold x 10 with VC and TC Seated Scapular Retraction  x 10 with VC  Levator  Stretch  reps - 30sec hold x 2 Shoulder ER with scapular retraction-  2 x 10 reps - Scapular Retraction with RTB 10 reps - Shoulder  Extension with RTB Palms Forward  - 10 reps Manual Therapy:  STW R posterior shoulder, deltoid, tricep supine STM and gentle trigger point release R Levator UT,  Trigger Point Dry-Needling performed 02-09-23  by Voncille Lo Treatment instructions: Expect mild to moderate muscle soreness. S/S of pneumothorax if dry needled over a lung field, and to seek immediate medical attention should they occur. Patient verbalized understanding of these instructions and education.  Patient Consent Given: Yes Education handout provided: Previously provided Muscles treated:  RT upper trap and RT levator Electrical stimulation performed: No Parameters: N/A Treatment response/outcome: twitch response noted, pt noted relief                                                                                     [facility] Adult PT Treatment:                                                DATE: 02/04/23 Therapeutic Exercise: Radial nerve glides, seated x10  LS stretch towards L 3x30sec seated cues for setup and comfortable ROM  UT stretch towards L 2x30sec seated cues for comfortable ROM Cervical flexion stretch 2x30sec seated cues for comfortable ROM Scapular retraction x8 cues for reduced compensations, comfortable ROM  Manual Therapy: Very gentle STM R posterior shoulder, deltoid, tricep; seated STM and gentle trigger point release R LS, UT, and rhomboid; seated    PATIENT EDUCATION:  Education details: rationale for interventions, PT POC Person educated: Patient Education method: Explanation, Demonstration, Tactile cues, Verbal cues Education comprehension: verbalized understanding, returned demonstration, verbal cues required, tactile cues required, and needs further education     HOME EXERCISE PROGRAM: Access Code: [email or web address] URL: [email or web address] Date: 02/23/2023 Prepared by: Voncille Lo  Exercises - Supine Cervical Retraction with Towel  - 1 x daily - 7 x weekly - 1 sets - 10  reps - Seated Scapular Retraction  - 1 x daily - 7 x weekly - 2 sets - 8 reps - Gentle Levator Scapulae Stretch  - 1 x daily - 7 x weekly - 1 sets - 3 reps - 30sec hold - Shoulder External Rotation and Scapular Retraction with Resistance  - 2 x daily - 7 x weekly - 3 sets - 10 reps - Scapular Retraction with Resistance  - 2 x daily - 7 x weekly - 3 sets - 10 reps - Shoulder Extension with Resistance - Palms Forward  - 1 x daily - 7 x weekly - 3 sets - 10 reps - Seated Assisted Cervical Rotation with Towel  - 1 x daily - 7 x weekly - 1 sets - 3  reps - 10-15 sec hold - Standing Thoracic Extension at Wall  - 1 x daily - 7 x weekly - 1 sets - 5 reps - 5-10 sec for good pull hold ASSESSMENT:   CLINICAL IMPRESSION 03/02/2023 Pt arrives w/ report of increased shoulder pain over weekend but states he has been painfree for a couple of days after recent sessions which he attributes to manual. Remains hesitant with exercise but is agreeable after manual therapy and education on importance. No adverse events, pt reports improved symptoms on departure and demonstrates increase in ROM. Pt departs today's session in no acute distress, all voiced questions/concerns addressed appropriately from PT perspective.     Eval - Patient is a pleasant 62 y.o. gentleman who was seen today for physical therapy evaluation and treatment for chronic neck pain. Red flag questioning is overall reassuring although neuro exam is somewhat confounded by complex medical history (see above, includes CVA and multiple TIAs, reported crushing injury distal RLE several years ago). Pt notes these symptoms are similar to symptoms prior to last cervical fusion and are limiting his tolerance to daily and work activities. On exam pt demonstrates cervical and GH mobility deficits with corresponding muscular irritation of R LS/UT, deltoid and proximal biceps. RUE numbness able to be resolved w/ pressure on R LS trigger point but returns with cessation  of pressure. Increased time w/ history and exam given complex medical hx and symptom irritability, subsequently deferred HEP to next visit. No adverse events, report of muscular fatigue on departure but no overt increase in symptoms. Recommend skilled PT to address aforementioned deficits w/ aim of maximizing pt tolerance to daily activities. Pt departs today's session in no acute distress, all voiced questions/concerns addressed appropriately from PT perspective.     OBJECTIVE IMPAIRMENTS: decreased activity tolerance, decreased endurance, decreased mobility, decreased ROM, decreased strength, hypomobility, increased muscle spasms, impaired UE functional use, postural dysfunction, and pain.    ACTIVITY LIMITATIONS: carrying, lifting, bending, sleeping, and reach over head   PARTICIPATION LIMITATIONS: meal prep, cleaning, laundry, community activity, and occupation   PERSONAL FACTORS: Time since onset of injury/illness/exacerbation and 3+ comorbidities: HTN, CVA, cardiomyopathy, CKD3, DM2  are also affecting patient's functional outcome.    REHAB POTENTIAL: Fair given chronicity and comorbidities   CLINICAL DECISION MAKING: Evolving/moderate complexity   EVALUATION COMPLEXITY: Moderate     GOALS: Goals reviewed with patient? No given time constraints   SHORT TERM GOALS: Target date: 02/16/2023 Pt will demonstrate appropriate understanding and performance of initially prescribed HEP in order to facilitate improved independence with management of symptoms.  Baseline: HEP TBD Goal status: INITIAL    2. Pt will score greater than or equal to 45 on FOTO in order to demonstrate improved perception of function due to symptoms.            Baseline: 37            Goal status: INITIAL     LONG TERM GOALS: Target date: 03/16/2023 Pt will score 52 on FOTO in order to demonstrate improved perception of function due to symptoms. Baseline: 37 Goal status: INITIAL   2. Pt will demonstrate at least 55  degrees of active cervical rotation to R in order to demonstrate improved environmental awareness and safety with driving.  Baseline: see ROM chart above Goal status: INITIAL   3. Pt will demonstrate at least 140deg of active R shoulder elevation in order to facilitate improved tolerance w/ household/work tasks Baseline: see ROM chart above Goal status:  INITIAL    4. Pt will report/demonstrate ability to lift up to 10# with less than 2 point increase in resting pain on NPS in order to demonstrate improved tolerance to work activities. Baseline: NT due to symptom irritability Goal status: INITIAL      PLAN:   PT FREQUENCY: 1-2x/week   PT DURATION: 8 weeks   PLANNED INTERVENTIONS: Therapeutic exercises, Therapeutic activity, Neuromuscular re-education, Balance training, Gait training, Patient/Family education, Self Care, Joint mobilization, Vestibular training, Aquatic Therapy, Dry Needling, Electrical stimulation, Spinal mobilization, Cryotherapy, Moist heat, Taping, Manual therapy, and Re-evaluation   PLAN FOR NEXT SESSION: Manual PRN as indicated. Gentle cervical mobility, cervical/periscapular activation exercises as able/appropriate     Leeroy Cha PT, DPT 03/02/2023 3:56 PM

## 2023-03-02 ENCOUNTER — Ambulatory Visit: Payer: Medicare HMO | Admitting: Physical Therapy

## 2023-03-02 ENCOUNTER — Encounter: Payer: Self-pay | Admitting: Physical Therapy

## 2023-03-02 DIAGNOSIS — R293 Abnormal posture: Secondary | ICD-10-CM | POA: Diagnosis not present

## 2023-03-02 DIAGNOSIS — M6281 Muscle weakness (generalized): Secondary | ICD-10-CM | POA: Diagnosis not present

## 2023-03-02 DIAGNOSIS — M542 Cervicalgia: Secondary | ICD-10-CM | POA: Diagnosis not present

## 2023-03-03 NOTE — Therapy (Signed)
OUTPATIENT PHYSICAL THERAPY TREATMENT NOTE   Patient Name: Nicholas [name] MRN: [id number] DOB:February 05, 1961, 62 y.o., male Today's Date: 03/04/2023 6 PCP: [name], MD   REFERRING PROVIDER: Leeanne Rio, MD  END OF SESSION:   PT End of Session - 03/04/23 1334     Visit Number 9    Date for PT Re-Evaluation 03/16/23    Authorization Type humana medicare    Authorization Time Period 12 visits 01/25/23-03/13/23    PT Start Time 1145    PT Stop Time 1230    PT Time Calculation (min) 45 min    Activity Tolerance Patient limited by pain;No increased pain;Patient tolerated treatment well    Behavior During Therapy WFL for tasks assessed/performed              Past Medical History:  Diagnosis Date   Asthma    Bipolar 1 disorder ([place])    Borderline glaucoma    Chronic pain    Epididymal pain    LEFT   Epilepsy, 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77---  LAST SEIZURE 1 WK AGO (APPROX ,  10-31-2013)   NO NEUROLOGIST---  PT SEES PCP  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   Feeling of incomplete bladder emptying    Frequency of urination    Gastric ulcer    GERD (gastroesophageal reflux disease)    Hypertension    Hyperthyroidism    NO MEDS    Migraine    Seizures (HCC)    Stroke ([place])    TIA (transient ischemic attack)    Type 2 diabetes mellitus ([place])    Past Surgical History:  Procedure Laterality Date   ABDOMINAL SURGERY     ANTERIOR CERVICAL DECOMP/DISCECTOMY FUSION  2007   C4  --  C6   CERVICAL FUSION     CHOLECYSTECTOMY     COLONOSCOPY WITH PROPOFOL Left 11/02/2020   Procedure: COLONOSCOPY WITH PROPOFOL;  Surgeon: Arta Silence, MD;  Location: [place];  Service: Endoscopy;  Laterality: Left;   CYSTOSCOPY N/A 11/09/2013   Procedure: CYSTOSCOPY FLEXIBLE;  Surgeon: Irine Seal, MD;  Location[facility];  Service: Urology;  Laterality: N/A;   EPIDIDYMECTOMY Left 11/09/2013   Procedure:  LEFT EPIDIDYMECTOMY;  Surgeon: Irine Seal, MD;  Location: [facility];  Service: Urology;  Laterality: Left;  POSSIBLE OUTPATIENT WITH OBSERVATION   EXCISION LIPOMA LEFT SHOULDER  2004 (APPROX)   MULTIPLE CYST REMOVED FROM CHEST  AGE 89   OTHER SURGICAL HISTORY     hemorroid surgery    TESTICLE REMOVAL Left    TONSILLECTOMY     Patient Active Problem List   Diagnosis Date Noted   Dizziness 01/02/2023   Bipolar 1 disorder ([place]) 07/01/2022   Hyperlipidemia 07/01/2022   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 A999333   Metabolic acidosis A999333   Palpitations 05/08/2022   Seizures ([place]) 12/03/2020   Rectal bleeding 10/31/2020   Cardiomyopathy ([place]) 09/23/2020   Atypical chest pain 09/23/2020   Precordial pain    CVA (cerebral vascular accident) ([place]) 09/02/2020   Seizure ([place]) 07/22/2020   Left-sided weakness 07/04/2020   Dysarthria 07/04/2020   Essential hypertension    Seizure disorder ([place]) 07/02/2017   Type 2 diabetes mellitus with diabetic neuropathy, with long-term current use of insulin and hyperglycemia 07/02/2017   Stage 3a chronic kidney disease (CKD) ([place]) 07/02/2017   Normocytic anemia 07/02/2017   Testicular/scrotal pain 11/09/2013   Microhematuria 11/09/2013   Condyloma acuminatum of scrotum 11/09/2013    REFERRING  DIAG: Z98.1 (ICD-10-CM) - History of fusion of cervical spine M54.2 (ICD-10-CM) - Neck pain  THERAPY DIAG:  Abnormal posture  Muscle weakness (generalized)  Cervicalgia  Rationale for Evaluation and Treatment Rehabilitation  PERTINENT HISTORY: HTN, CVA/TIA, cardiomyopathy, DM2, seizures, CKD3, bipolar 1, migraines, sleep apnea    PRECAUTIONS: seizure precautions, cardiac hx, cervical fusions (C4-T1 per pt), fall risk  SUBJECTIVE:                                                                                                                                                                                      SUBJECTIVE STATEMENT:   03/04/2023  Pt states he is 9/10 pain today in RT shld due to  sleeping on couch with neck in an odd position and woke up in pain   PAIN:  Are you having pain: Yes : 7/10 NPS on this date Location/description: L neck/shoulder with some referral into UE, describes as fatigued and spasms. Numbness described in radial distribution Best-worst over past week: 0-10/10  Per eval -  - aggravating factors: turning head to R, stretching, sleeping, bending, lifting, twisting - Easing factors: medication, TENS, biofreeze   OBJECTIVE: (objective measures completed at initial evaluation unless otherwise dated)   DIAGNOSTIC FINDINGS:  No recent cervical imaging CTH/MRI head and MRI lumbar spine July 2023, see epic for details   PATIENT SURVEYS:  FOTO 37 current, 52 predicted   COGNITION: Overall cognitive status: Within functional limits for tasks assessed although pt endorses mild memory issues since stroke   SENSATION/NEURO: Light touch intact all extremities but reduced on distal RLE, allodynia on C4-C7 (reports sensory changes after stroke and prior RLE injury) Finger<>nose testing mild incoordination RUE compared to L, no overt ataxia but intermittent dysmetria (pt attributes this to glaucoma issues) No clonus BIL Negative hoffmann and tromner sign BIL No ataxia with gait No aberrant eye movements or symptoms with saccades/smooth pursuits   POSTURE: significant forward head posture, trunk lean to L, increased L UT elevation   PALPATION: Concordant tenderness w/ trigger points R LS/UT, deltoid and biceps. Also able to resolve UE numbness w/ palpation of trigger point in R LS although returns immediately after cessation of pressure.              CERVICAL ROM:    Active ROM A/PROM (deg) eval AROM 02-18-23 AROM 03/02/23  Flexion 50% *    Extension 75% s    Right lateral flexion      Left lateral flexion      Right rotation 40 deg * 49 45 deg  Left rotation 60 deg  60 60 deg   (Blank rows = not tested)  UPPER EXTREMITY ROM:   Active ROM  Right eval Left eval Right  02/25/23 03/02/23 AROM   Shoulder flexion 110 * s  150 deg 130 130 deg pre treatment 140 deg post manual    Shoulder abduction        Shoulder internal rotation     L5   Shoulder external rotation     T2  Spine scapula  Elbow flexion        Elbow extension        Wrist flexion        Wrist extension         (Blank rows = not tested) (Key: WFL = within functional limits not formally assessed, * = concordant pain, s = stiffness/stretching sensation, NT = not tested)  Comments:   UPPER EXTREMITY MMT:   MMT Right eval Left eval  Shoulder flexion      Shoulder extension      Shoulder abduction      Shoulder extension      Shoulder internal rotation      Shoulder external rotation      Elbow flexion      Elbow extension      Grip strength      (Blank rows = not tested)  (Key: WFL = within functional limits not formally assessed, * = concordant pain, s = stiffness/stretching sensation, NT = not tested)  Comments: deferred on eval given symptom irritability   CERVICAL SPECIAL TESTS:  Spurlings: + on R, mild discomfort on L  Sharps Purser: negative   FUNCTIONAL TESTS:  Overhead reach limited as above   TODAY'S TREATMENT:   [facility] Adult PT Treatment:                                                DATE: 03-04-23 Therapeutic Exercise: Deep neck flexor stretch  with pink foam roller with back and neck aligned in supine and hooklying hold x 10 with VC and TC 15 # DB 3 x 10  Supine on pink foam roller Shoulder ER with scapular retraction-   3 x 10 reps Supine on pink foam roller Shoulder flexion with GTB with scapular retraction-   3 x 10 reps Star pattern on pink foam roller  GTB with horizontal abd and diagonals 2 x 10 Supine on mat Shoulder ER with scapular retraction-   2 x 10 reps Supine on mat Shoulder flexion with GTB with scapular retraction-   2 x 10 reps Star pattern on mat  GTB with horizontal abd and diagonals 2 x 10 SELF CARE  Education on  posture and body mechanics using handouts for explanation  Manual Therapy:  STW R upper trap  and LS and scalenes and deltoid/ rhomboid With pt head over edge of mat,PT provides added extension with firm grip. Over flexing neck to max available range. UPA on RT over C- 3, 4 5, 6 Trigger Point Dry-Needling performed 02-23-23  by Voncille Lo Treatment instructions: Expect mild to moderate muscle soreness. S/S of pneumothorax if dry needled over a lung field, and to seek immediate medical attention should they occur. Patient verbalized understanding of these instructions and education. Patient Consent Given: Yes Education handout provided: Previously provided Muscles treated:  RT upper trap and RT levator, Rt rhomboid Electrical stimulation performed: No Parameters: N/A Treatment response/outcome: twitch response noted, pt noted relief  [facility] Adult PT Treatment:                                                DATE: 03/02/23 Therapeutic Exercise: Supine OH flexion w dowel x10 cues for setup Supine cervical rotation BIL x15 cues for appropriate ROM Doorway rhomboid stretch 3x30sec cues for setup  Manual Therapy: Sidelying; STM R rhomboid, mid trap, post delt, infraspinatus, teres, lat. Scapulothoracic mobs all directions to pt tolerance    [facility] Adult PT Treatment:                                                DATE: 02/25/23 Therapeutic Exercise: Supine GTB star patten  Supine shoulder  ER green TB Rhomboid stretch on doorknobs  Single arm rhomboid stretch in doorway  Manual Therapy: Left shoulder GHJ mobilizations followed by PROM  Sidelying scap mobs and STW along medial border of scap    [facility] Adult PT Treatment:                                                DATE: 02-23-23 Therapeutic Exercise: Post TPDN, lying on back with towel length wise for 5  min with UE overhead for increased extension Deep neck flexor stretch with towel roll and chin tuck 10 sec hold x 10 with VC and  TC 15 # DB 3 x 10  Shoulder ER with scapular retraction-   3 x 10 reps Shoulder Extension with GTB Palms Forward  -  3 x 10 reps Star pattern  GTB with horizontal abd and diagonals 2 x 10 Thoracic extension with elbows on wall and flex knees. 2 x 10. Manual Therapy:  STW R upper trap  and LS and scalenes and deltoid/ rhomboid With pt head over edge of mat,PT provides added extension with firm grip. Over flexing neck to max available range. UPA on RT over C- 3, 4 5, 6 Trigger Point Dry-Needling performed 02-23-23  by Voncille Lo Treatment instructions: Expect mild to moderate muscle soreness. S/S of pneumothorax if dry needled over a lung field, and to seek immediate medical attention should they occur. Patient verbalized understanding of these instructions and education. Patient Consent Given: Yes Education handout provided: Previously provided Muscles treated:  RT upper trap and RT levator, C 3, 4 ,5 RT cervical paraspinal,  Subocciptial RT Electrical stimulation performed: No Parameters: N/A Treatment response/outcome: twitch response noted, pt noted relief  [facility] Adult PT Treatment:                                                DATE: 02-17-22 Therapeutic Exercise: All exercises done in supine concurrently with moist hot pack Deep neck flexor stretch with towel roll and chin tuck 10 sec hold x 10 with VC and TC  Pt able to hold 40 sec supine deep flexion isometric at end Shoulder ER with scapular retraction-   3 x 10 reps Seated Scapular Retraction  x 10 with  VC  Levator  Stretch  reps - 30sec hold x 2 Shoulder Extension with RTB Palms Forward  - 10 reps Manual Therapy:  STW R upper trap and scalenes and deltoid/ rhomboid STM and gentle trigger point release R Levator,  UT,  Trigger Point Dry-Needling performed 02-09-23  by Voncille Lo Treatment instructions: Expect mild to moderate muscle soreness. S/S of pneumothorax if dry needled over a lung field, and to seek immediate  medical attention should they occur. Patient verbalized understanding of these instructions and education. Patient Consent Given: Yes Education handout provided: Previously provided Muscles treated:  RT upper trap and RT levator, C 3, 4 ,5 RT cervical paraspinal, R rhomboid, R deltoid Electrical stimulation performed: No Parameters: N/A Treatment response/outcome: twitch response noted, pt noted relief Modalities  moist cervical hot pack  [facility] Adult PT Treatment:                                                DATE: 02/16/23 Manual Therapy: Seated: STM R LS, UT, rhomboid, infraspinatus, posterior deltoid; trigger point release LS and UT  Therapeutic Activity: Discussion re: activity modification, relevant anatomy/physiology as it pertains to activity, PT POC and rationale for interventions Pt politely declines exercise on this date  [facility] Adult PT Treatment:                                                DATE: 02-09-23 Therapeutic Exercise: Radial nerve glides, supine x10  no pain post TPDN Deep neck flexor stretch with towel roll and chin tuck 10 sec hold x 10 with VC and TC Seated Scapular Retraction  x 10 with VC  Levator  Stretch  reps - 30sec hold x 2 Shoulder ER with scapular retraction-  2 x 10 reps - Scapular Retraction with RTB 10 reps - Shoulder Extension with RTB Palms Forward  - 10 reps Manual Therapy:  STW R posterior shoulder, deltoid, tricep supine STM and gentle trigger point release R Levator UT,  Trigger Point Dry-Needling performed 02-09-23  by Voncille Lo Treatment instructions: Expect mild to moderate muscle soreness. S/S of pneumothorax if dry needled over a lung field, and to seek immediate medical attention should they occur. Patient verbalized understanding of these instructions and education.  Patient Consent Given: Yes Education handout provided: Previously provided Muscles treated:  RT upper trap and RT levator Electrical stimulation performed:  No Parameters: N/A Treatment response/outcome: twitch response noted, pt noted relief                                                                                     [facility] Adult PT Treatment:  DATE: 02/04/23 Therapeutic Exercise: Radial nerve glides, seated x10  LS stretch towards L 3x30sec seated cues for setup and comfortable ROM  UT stretch towards L 2x30sec seated cues for comfortable ROM Cervical flexion stretch 2x30sec seated cues for comfortable ROM Scapular retraction x8 cues for reduced compensations, comfortable ROM  Manual Therapy: Very gentle STM R posterior shoulder, deltoid, tricep; seated STM and gentle trigger point release R LS, UT, and rhomboid; seated    PATIENT EDUCATION:  Education details: rationale for interventions, PT POC Person educated: Patient Education method: Explanation, Demonstration, Tactile cues, Verbal cues Education comprehension: verbalized understanding, returned demonstration, verbal cues required, tactile cues required, and needs further education     HOME EXERCISE PROGRAM: Access Code: [email or web address] URL: [email or web address] Date: 02/23/2023 Prepared by: Voncille Lo  Exercises - Supine Cervical Retraction with Towel  - 1 x daily - 7 x weekly - 1 sets - 10 reps - Seated Scapular Retraction  - 1 x daily - 7 x weekly - 2 sets - 8 reps - Gentle Levator Scapulae Stretch  - 1 x daily - 7 x weekly - 1 sets - 3 reps - 30sec hold - Shoulder External Rotation and Scapular Retraction with Resistance  - 2 x daily - 7 x weekly - 3 sets - 10 reps - Scapular Retraction with Resistance  - 2 x daily - 7 x weekly - 3 sets - 10 reps - Shoulder Extension with Resistance - Palms Forward  - 1 x daily - 7 x weekly - 3 sets - 10 reps - Seated Assisted Cervical Rotation with Towel  - 1 x daily - 7 x weekly - 1 sets - 3 reps - 10-15 sec hold - Standing Thoracic Extension at Wall  - 1 x daily - 7 x  weekly - 1 sets - 5 reps - 5-10 sec for good pull hold ASSESSMENT:   CLINICAL IMPRESSION 03/04/2023 Pt arrives w/ report of increased shoulder pain yesterday after sleeping on the couch and  neck was in odd position.  Pt reports 9/10 pain. Pt consents to TPDN and is closely monitored throughout session.  Pt was educated on posture and body mechanics and given handouts. Pt performed supine exercises to maintain alignment and decrease irritation of RT shld/neck.  . No adverse events, pt reports improved symptoms on departure and demonstrates increase in ROM. Pt departs today's session in no acute distress, all voiced questions/concerns addressed appropriately from PT perspective.     Eval - Patient is a pleasant 62 y.o. gentleman who was seen today for physical therapy evaluation and treatment for chronic neck pain. Red flag questioning is overall reassuring although neuro exam is somewhat confounded by complex medical history (see above, includes CVA and multiple TIAs, reported crushing injury distal RLE several years ago). Pt notes these symptoms are similar to symptoms prior to last cervical fusion and are limiting his tolerance to daily and work activities. On exam pt demonstrates cervical and GH mobility deficits with corresponding muscular irritation of R LS/UT, deltoid and proximal biceps. RUE numbness able to be resolved w/ pressure on R LS trigger point but returns with cessation of pressure. Increased time w/ history and exam given complex medical hx and symptom irritability, subsequently deferred HEP to next visit. No adverse events, report of muscular fatigue on departure but no overt increase in symptoms. Recommend skilled PT to address aforementioned deficits w/ aim of maximizing pt tolerance to daily activities. Pt departs today's session in no acute distress, all voiced questions/concerns  addressed appropriately from PT perspective.     OBJECTIVE IMPAIRMENTS: decreased activity tolerance,  decreased endurance, decreased mobility, decreased ROM, decreased strength, hypomobility, increased muscle spasms, impaired UE functional use, postural dysfunction, and pain.    ACTIVITY LIMITATIONS: carrying, lifting, bending, sleeping, and reach over head   PARTICIPATION LIMITATIONS: meal prep, cleaning, laundry, community activity, and occupation   PERSONAL FACTORS: Time since onset of injury/illness/exacerbation and 3+ comorbidities: HTN, CVA, cardiomyopathy, CKD3, DM2  are also affecting patient's functional outcome.    REHAB POTENTIAL: Fair given chronicity and comorbidities   CLINICAL DECISION MAKING: Evolving/moderate complexity   EVALUATION COMPLEXITY: Moderate     GOALS: Goals reviewed with patient? No given time constraints   SHORT TERM GOALS: Target date: 02/16/2023 Pt will demonstrate appropriate understanding and performance of initially prescribed HEP in order to facilitate improved independence with management of symptoms.  Baseline: HEP TBD Goal status: INITIAL    2. Pt will score greater than or equal to 45 on FOTO in order to demonstrate improved perception of function due to symptoms.            Baseline: 37            Goal status: INITIAL     LONG TERM GOALS: Target date: 03/16/2023 Pt will score 52 on FOTO in order to demonstrate improved perception of function due to symptoms. Baseline: 37 Goal status: INITIAL   2. Pt will demonstrate at least 55 degrees of active cervical rotation to R in order to demonstrate improved environmental awareness and safety with driving.  Baseline: see ROM chart above Goal status: INITIAL   3. Pt will demonstrate at least 140deg of active R shoulder elevation in order to facilitate improved tolerance w/ household/work tasks Baseline: see ROM chart above Goal status: INITIAL    4. Pt will report/demonstrate ability to lift up to 10# with less than 2 point increase in resting pain on NPS in order to demonstrate improved tolerance  to work activities. Baseline: NT due to symptom irritability Goal status: INITIAL      PLAN:   PT FREQUENCY: 1-2x/week   PT DURATION: 8 weeks   PLANNED INTERVENTIONS: Therapeutic exercises, Therapeutic activity, Neuromuscular re-education, Balance training, Gait training, Patient/Family education, Self Care, Joint mobilization, Vestibular training, Aquatic Therapy, Dry Needling, Electrical stimulation, Spinal mobilization, Cryotherapy, Moist heat, Taping, Manual therapy, and Re-evaluation   PLAN FOR NEXT SESSION: Manual PRN as indicated. Gentle cervical mobility, cervical/periscapular activation exercises as able/appropriate     Voncille Lo, PT, [place] Certified Exercise Expert for the Aging Adult  03/04/23 1:35 PM Phone: [phone] Fax: [phone]

## 2023-03-04 ENCOUNTER — Ambulatory Visit: Payer: Medicare HMO | Admitting: Physical Therapy

## 2023-03-04 ENCOUNTER — Encounter: Payer: Self-pay | Admitting: Physical Therapy

## 2023-03-04 DIAGNOSIS — M6281 Muscle weakness (generalized): Secondary | ICD-10-CM

## 2023-03-04 DIAGNOSIS — M542 Cervicalgia: Secondary | ICD-10-CM | POA: Diagnosis not present

## 2023-03-04 DIAGNOSIS — R293 Abnormal posture: Secondary | ICD-10-CM | POA: Diagnosis not present

## 2023-03-04 NOTE — Patient Instructions (Addendum)
Posture Tips DO: - stand tall and erect - keep chin tucked in - keep head and shoulders in alignment - check posture regularly in mirror or large window - pull head back against headrest in car seat;  Change your position often.  Sit with lumbar support. DON'T: - slouch or slump while watching TV or reading - sit, stand or lie in one position  for too long;  Sitting is especially hard on the spine so if you sit at a desk/use the computer, then stand up often!   Copyright  VHI. All rights reserved.  Posture - Standing   Good posture is important. Avoid slouching and forward head thrust. Maintain curve in low back and align ears over shoul- ders, hips over ankles.  Pull your belly button in toward your back bone.   Copyright  VHI. All rights reserved.  Posture - Sitting   Sit upright, head facing forward. Try using a roll to support lower back. Keep shoulders relaxed, and avoid rounded back. Keep hips level with knees. Avoid crossing legs for long periods.  Sit on your sit bones not your tail bone. Keep ribs lifted up like a golden thread is pulling you to the sky.. use a lumbar roll to take pressure off your neck and spine Copyright  VHI. All rights reserved.    Sleeping on Back  Place pillow under knees. A pillow with cervical support and a roll around waist are also helpful. Copyright  VHI. All rights reserved.  Sleeping on Side Place pillow between knees. Use cervical support under neck and a roll around waist as needed. Copyright  VHI. All rights reserved.   Sleeping on Stomach   If this is the only desirable sleeping position, place pillow under lower legs, and under stomach or chest as needed.  Posture - Sitting   Sit upright, head facing forward. Try using a roll to support lower back. Keep shoulders relaxed, and avoid rounded back. Keep hips level with knees. Avoid crossing legs for long periods. Stand to Sit / Sit to Stand   To sit: Bend knees to lower self onto  front edge of chair, then scoot back on seat. To stand: Reverse sequence by placing one foot forward, and scoot to front of seat. Use rocking motion to stand up.   Work Height and Reach  Ideal work height is no more than 2 to 4 inches below elbow level when standing, and at elbow level when sitting. Reaching should be limited to arm's length, with elbows slightly bent.  Bending  Bend at hips and knees, not back. Keep feet shoulder-width apart.    Posture - Standing   Good posture is important. Avoid slouching and forward head thrust. Maintain curve in low back and align ears over shoul- ders, hips over ankles.  Alternating Positions   Alternate tasks and change positions frequently to reduce fatigue and muscle tension. Take rest breaks. Computer Work   Position work to Programmer, multimedia. Use proper work and seat height. Keep shoulders back and down, wrists straight, and elbows at right angles. Use chair that provides full back support. Add footrest and lumbar roll as needed.  Getting Into / Out of Car  Lower self onto seat, scoot back, then bring in one leg at a time. Reverse sequence to get out.  Dressing  Lie on back to pull socks or slacks over feet, or sit and bend leg while keeping back straight.    Housework - Sink  Take weight off  spine but putting your foot on foot stool  Place one foot on ledge of cabinet under sink when standing at sink for prolonged periods.   Pushing / Pulling  Pushing is preferable to pulling. Keep back in proper alignment, and use leg muscles to do the work.  Deep Squat   Squat and lift with both arms held against upper trunk. Tighten stomach muscles without holding breath. Use smooth movements to avoid jerking.  Avoid Twisting   Avoid twisting or bending back. Pivot around using foot movements, and bend at knees if needed when reaching for articles.  Carrying Luggage   Distribute weight evenly on both sides. Use a cart whenever  possible. Do not twist trunk. Move body as a unit.   Lifting Principles Maintain proper posture and head alignment. Slide object as close as possible before lifting. Move obstacles out of the way. Test before lifting; ask for help if too heavy. Tighten stomach muscles without holding breath. Use smooth movements; do not jerk. Use legs to do the work, and pivot with feet. Distribute the work load symmetrically and close to the center of trunk. Push instead of pull whenever possible.   Ask For Help   Ask for help and delegate to others when possible. Coordinate your movements when lifting together, and maintain the low back curve.  Log Roll   Lying on back, bend left knee and place left arm across chest. Roll all in one movement to the right. Reverse to roll to the left. Always move as one unit. Housework - Sweeping  Use long-handled equipment to avoid stooping.   Housework - Wiping  Position yourself as close as possible to reach work surface. Avoid straining your back.  Laundry - Unloading Wash   To unload small items at bottom of washer, lift leg opposite to arm being used to reach.  [place] close to area to be raked. Use arm movements to do the work. Keep back straight and avoid twisting.     Cart Golfer's leg When reaching into cart with one arm, lift opposite leg to keep back straight.   Getting Into / Out of Bed  Lower self to lie down on one side by raising legs and lowering head at the same time. Use arms to assist moving without twisting. Bend both knees to roll onto back if desired. To sit up, start from lying on side, and use same move-ments in reverse. Housework - Vacuuming  Hold the vacuum with arm held at side. Step back and forth to move it, keeping head up. Avoid twisting.   Laundry - IT consultant so that bending and twisting can be avoided.   Laundry - Unloading Dryer  Squat down to reach into  clothes dryer or use a reacher.  Gardening - Weeding / Probation officer or Kneel. Knee pads may be helpful.                     Nicholas [name], PT, [place] Certified Exercise Expert for the Aging Adult  03/04/23 12:23 PM Phone: [phone] Fax: [phone]

## 2023-03-07 DIAGNOSIS — G4733 Obstructive sleep apnea (adult) (pediatric): Secondary | ICD-10-CM | POA: Diagnosis not present

## 2023-03-09 DIAGNOSIS — G4733 Obstructive sleep apnea (adult) (pediatric): Secondary | ICD-10-CM | POA: Diagnosis not present

## 2023-03-11 ENCOUNTER — Encounter: Payer: Self-pay | Admitting: Physical Therapy

## 2023-03-11 ENCOUNTER — Ambulatory Visit: Payer: Medicare HMO | Attending: Physician Assistant | Admitting: Physical Therapy

## 2023-03-11 ENCOUNTER — Other Ambulatory Visit (HOSPITAL_BASED_OUTPATIENT_CLINIC_OR_DEPARTMENT_OTHER): Payer: Self-pay

## 2023-03-11 DIAGNOSIS — R293 Abnormal posture: Secondary | ICD-10-CM | POA: Diagnosis not present

## 2023-03-11 DIAGNOSIS — M542 Cervicalgia: Secondary | ICD-10-CM | POA: Insufficient documentation

## 2023-03-11 DIAGNOSIS — M6281 Muscle weakness (generalized): Secondary | ICD-10-CM | POA: Diagnosis not present

## 2023-03-11 MED ORDER — OXYCODONE-ACETAMINOPHEN 10-325 MG PO TABS
1.0000 | ORAL_TABLET | Freq: Three times a day (TID) | ORAL | 0 refills | Status: DC
  Filled 2023-03-11: qty 90, 30d supply, fill #0

## 2023-03-11 NOTE — Therapy (Addendum)
OUTPATIENT PHYSICAL THERAPY TREATMENT NOTE/PRogress note Progress Note Reporting Period 01-19-23 to 03-11-23  See note below for Objective Data and Assessment of Progress/Goals.       Patient Name: Nicholas [name] MRN: [id number] DOB:Nov 14, 1961, 62 y.o., male Today's Date: 03/11/2023 6 PCP: [name], MD   REFERRING PROVIDER: Leeanne Rio, MD  END OF SESSION:   PT End of Session - 03/11/23 1400     Visit Number 10    Date for PT Re-Evaluation 03/16/23    Authorization Time Period 12 visits 01/25/23-03/13/23    Authorization - Visit Number 9    Authorization - Number of Visits 12    Progress Note Due on Visit 10    PT Start Time 1401    PT Stop Time [id number]    PT Time Calculation (min) 44 min              Past Medical History:  Diagnosis Date   Asthma    Bipolar 1 disorder    Borderline glaucoma    Chronic pain    Epididymal pain    LEFT   Epilepsy, 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 4---  LAST SEIZURE 1 WK AGO (APPROX ,  10-31-2013)   NO NEUROLOGIST---  PT SEES PCP  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   Feeling of incomplete bladder emptying    Frequency of urination    Gastric ulcer    GERD (gastroesophageal reflux disease)    Hypertension    Hyperthyroidism    NO MEDS    Migraine    Seizures    Stroke    TIA (transient ischemic attack)    Type 2 diabetes mellitus    Past Surgical History:  Procedure Laterality Date   ABDOMINAL SURGERY     ANTERIOR CERVICAL DECOMP/DISCECTOMY FUSION  2007   C4  --  C6   CERVICAL FUSION     CHOLECYSTECTOMY     COLONOSCOPY WITH PROPOFOL Left 11/02/2020   Procedure: COLONOSCOPY WITH PROPOFOL;  Surgeon: Arta Silence, MD;  Location: [facility];  Service: Endoscopy;  Laterality: Left;   CYSTOSCOPY N/A 11/09/2013   Procedure: CYSTOSCOPY FLEXIBLE;  Surgeon: Irine Seal, MD;  Location: [facility];  Service: Urology;  Laterality: N/A;   EPIDIDYMECTOMY Left 11/09/2013   Procedure:  LEFT EPIDIDYMECTOMY;  Surgeon: Irine Seal, MD;  Location:  [facility];  Service: Urology;  Laterality: Left;  POSSIBLE OUTPATIENT WITH OBSERVATION   EXCISION LIPOMA LEFT SHOULDER  2004 (APPROX)   MULTIPLE CYST REMOVED FROM CHEST  AGE 16   OTHER SURGICAL HISTORY     hemorroid surgery    TESTICLE REMOVAL Left    TONSILLECTOMY     Patient Active Problem List   Diagnosis Date Noted   Dizziness 01/02/2023   Bipolar 1 disorder 07/01/2022   Hyperlipidemia 07/01/2022   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 A999333   Metabolic acidosis A999333   Palpitations 05/08/2022   Seizures 12/03/2020   Rectal bleeding 10/31/2020   Cardiomyopathy 09/23/2020   Atypical chest pain 09/23/2020   Precordial pain    CVA (cerebral vascular accident) 09/02/2020   Seizure 07/22/2020   Left-sided weakness 07/04/2020   Dysarthria 07/04/2020   Essential hypertension    Seizure disorder 07/02/2017   Type 2 diabetes mellitus with diabetic neuropathy, with long-term current use of insulin and hyperglycemia 07/02/2017   Stage 3a chronic kidney disease (CKD) 07/02/2017   Normocytic anemia 07/02/2017   Testicular/scrotal pain 11/09/2013   Microhematuria 11/09/2013   Condyloma acuminatum of  scrotum 11/09/2013    REFERRING DIAG: Z98.1 (ICD-10-CM) - History of fusion of cervical spine M54.2 (ICD-10-CM) - Neck pain  THERAPY DIAG:  Abnormal posture  Muscle weakness (generalized)  Cervicalgia  Rationale for Evaluation and Treatment Rehabilitation  PERTINENT HISTORY: HTN, CVA/TIA, cardiomyopathy, DM2, seizures, CKD3, bipolar 1, migraines, sleep apnea    PRECAUTIONS: seizure precautions, cardiac hx, cervical fusions (C4-T1 per pt), fall risk  SUBJECTIVE:                                                                                                                                                                                      SUBJECTIVE STATEMENT:   03/11/2023  The right shoulder and upper trap are in pain and throbbing.    PAIN:   Are you having pain: Yes :7 /10 NPS on this date Location/description: L neck/shoulder with some referral into UE, describes as fatigued and spasms. Numbness described in radial distribution Best-worst over past week: 0-10/10  Per eval -  - aggravating factors: turning head to R, stretching, sleeping, bending, lifting, twisting - Easing factors: medication, TENS, biofreeze   OBJECTIVE: (objective measures completed at initial evaluation unless otherwise dated)   DIAGNOSTIC FINDINGS:  No recent cervical imaging CTH/MRI head and MRI lumbar spine July 2023, see epic for details   PATIENT SURVEYS:  FOTO 37 current, 52 predicted   COGNITION: Overall cognitive status: Within functional limits for tasks assessed although pt endorses mild memory issues since stroke   SENSATION/NEURO: Light touch intact all extremities but reduced on distal RLE, allodynia on C4-C7 (reports sensory changes after stroke and prior RLE injury) Finger<>nose testing mild incoordination RUE compared to L, no overt ataxia but intermittent dysmetria (pt attributes this to glaucoma issues) No clonus BIL Negative hoffmann and tromner sign BIL No ataxia with gait No aberrant eye movements or symptoms with saccades/smooth pursuits   POSTURE: significant forward head posture, trunk lean to L, increased L UT elevation   PALPATION: Concordant tenderness w/ trigger points R LS/UT, deltoid and biceps. Also able to resolve UE numbness w/ palpation of trigger point in R LS although returns immediately after cessation of pressure.              CERVICAL ROM:    Active ROM A/PROM (deg) eval AROM 02-18-23 AROM 03/02/23 AROM 03/11/23  Flexion 50% *     Extension 75% s     Right lateral flexion       Left lateral flexion       Right rotation 40 deg * 49 45 deg 35  Left rotation 60 deg  60 60 deg 55   (Blank rows = not tested)  UPPER EXTREMITY ROM:   Active ROM Right eval Left eval Right  02/25/23 03/02/23 AROM   03/11/23 AROM  Shoulder flexion 110 * s  150 deg 130 130 deg pre treatment 140 deg post manual   130  Shoulder abduction       125  Shoulder internal rotation     L5  Reach T2  Shoulder external rotation     T2  Spine scapula Reach Upper trap  Elbow flexion         Elbow extension         Wrist flexion         Wrist extension          (Blank rows = not tested) (Key: WFL = within functional limits not formally assessed, * = concordant pain, s = stiffness/stretching sensation, NT = not tested)  Comments:   UPPER EXTREMITY MMT:   MMT Right eval Left eval  Shoulder flexion      Shoulder extension      Shoulder abduction      Shoulder extension      Shoulder internal rotation      Shoulder external rotation      Elbow flexion      Elbow extension      Grip strength      (Blank rows = not tested)  (Key: WFL = within functional limits not formally assessed, * = concordant pain, s = stiffness/stretching sensation, NT = not tested)  Comments: deferred on eval given symptom irritability   CERVICAL SPECIAL TESTS:  Spurlings: + on R, mild discomfort on L  Sharps Purser: negative   FUNCTIONAL TESTS:  Overhead reach limited as above   TODAY'S TREATMENT:   [facility] Adult PT Treatment:                                                DATE: 03/11/23 Therapeutic Exercise: Side lying right ER Side lying right shoulder abduction Supine horizontal abduction Green band  Supine star pattern , Green  Standing 15# dumb bell OH press- unable today due to pain  Manual Therapy: Right GHJ mobs A/P and inferior followed by PROM all planes within normal limits.   Modalities: HMP at end of session to posterior right shoulder Self Care: Theracase for self TPR   [facility] Adult PT Treatment:                                                DATE: 03-04-23 Therapeutic Exercise: Deep neck flexor stretch  with pink foam roller with back and neck aligned in supine and hooklying hold x 10 with VC and TC 15 # DB 3  x 10  Supine on pink foam roller Shoulder ER with scapular retraction-   3 x 10 reps Supine on pink foam roller Shoulder flexion with GTB with scapular retraction-   3 x 10 reps Star pattern on pink foam roller  GTB with horizontal abd and diagonals 2 x 10 Supine on mat Shoulder ER with scapular retraction-   2 x 10 reps Supine on mat Shoulder flexion with GTB with scapular retraction-   2 x 10 reps Star pattern on mat  GTB with horizontal abd and diagonals 2 x  10 SELF CARE  Education on posture and body mechanics using handouts for explanation  Manual Therapy:  STW R upper trap  and LS and scalenes and deltoid/ rhomboid With pt head over edge of mat,PT provides added extension with firm grip. Over flexing neck to max available range. UPA on RT over C- 3, 4 5, 6 Trigger Point Dry-Needling performed 02-23-23  by Voncille Lo Treatment instructions: Expect mild to moderate muscle soreness. S/S of pneumothorax if dry needled over a lung field, and to seek immediate medical attention should they occur. Patient verbalized understanding of these instructions and education. Patient Consent Given: Yes Education handout provided: Previously provided Muscles treated:  RT upper trap and RT levator, Rt rhomboid Electrical stimulation performed: No Parameters: N/A Treatment response/outcome: twitch response noted, pt noted relief  [facility] Adult PT Treatment:                                                DATE: 03/02/23 Therapeutic Exercise: Supine OH flexion w dowel x10 cues for setup Supine cervical rotation BIL x15 cues for appropriate ROM Doorway rhomboid stretch 3x30sec cues for setup  Manual Therapy: Sidelying; STM R rhomboid, mid trap, post delt, infraspinatus, teres, lat. Scapulothoracic mobs all directions to pt tolerance    [facility] Adult PT Treatment:                                                DATE: 02/25/23 Therapeutic Exercise: Supine GTB star patten  Supine shoulder  ER green  TB Rhomboid stretch on doorknobs  Single arm rhomboid stretch in doorway  Manual Therapy: Left shoulder GHJ mobilizations followed by PROM  Sidelying scap mobs and STW along medial border of scap    [facility] Adult PT Treatment:                                                DATE: 02-23-23 Therapeutic Exercise: Post TPDN, lying on back with towel length wise for 5  min with UE overhead for increased extension Deep neck flexor stretch with towel roll and chin tuck 10 sec hold x 10 with VC and TC 15 # DB 3 x 10  Shoulder ER with scapular retraction-   3 x 10 reps Shoulder Extension with GTB Palms Forward  -  3 x 10 reps Star pattern  GTB with horizontal abd and diagonals 2 x 10 Thoracic extension with elbows on wall and flex knees. 2 x 10. Manual Therapy:  STW R upper trap  and LS and scalenes and deltoid/ rhomboid With pt head over edge of mat,PT provides added extension with firm grip. Over flexing neck to max available range. UPA on RT over C- 3, 4 5, 6 Trigger Point Dry-Needling performed 02-23-23  by Voncille Lo Treatment instructions: Expect mild to moderate muscle soreness. S/S of pneumothorax if dry needled over a lung field, and to seek immediate medical attention should they occur. Patient verbalized understanding of these instructions and education. Patient Consent Given: Yes Education handout provided: Previously provided Muscles treated:  RT upper trap and RT levator, C 3,  4 ,5 RT cervical paraspinal,  Subocciptial RT Electrical stimulation performed: No Parameters: N/A Treatment response/outcome: twitch response noted, pt noted relief  [facility] Adult PT Treatment:                                                DATE: 02-17-22 Therapeutic Exercise: All exercises done in supine concurrently with moist hot pack Deep neck flexor stretch with towel roll and chin tuck 10 sec hold x 10 with VC and TC  Pt able to hold 40 sec supine deep flexion isometric at end Shoulder ER with  scapular retraction-   3 x 10 reps Seated Scapular Retraction  x 10 with VC  Levator  Stretch  reps - 30sec hold x 2 Shoulder Extension with RTB Palms Forward  - 10 reps Manual Therapy:  STW R upper trap and scalenes and deltoid/ rhomboid STM and gentle trigger point release R Levator,  UT,  Trigger Point Dry-Needling performed 02-09-23  by Voncille Lo Treatment instructions: Expect mild to moderate muscle soreness. S/S of pneumothorax if dry needled over a lung field, and to seek immediate medical attention should they occur. Patient verbalized understanding of these instructions and education. Patient Consent Given: Yes Education handout provided: Previously provided Muscles treated:  RT upper trap and RT levator, C 3, 4 ,5 RT cervical paraspinal, R rhomboid, R deltoid Electrical stimulation performed: No Parameters: N/A Treatment response/outcome: twitch response noted, pt noted relief Modalities  moist cervical hot pack  [facility] Adult PT Treatment:                                                DATE: 02/16/23 Manual Therapy: Seated: STM R LS, UT, rhomboid, infraspinatus, posterior deltoid; trigger point release LS and UT  Therapeutic Activity: Discussion re: activity modification, relevant anatomy/physiology as it pertains to activity, PT POC and rationale for interventions Pt politely declines exercise on this date      PATIENT EDUCATION:  Education details: rationale for interventions, PT POC Person educated: Patient Education method: Explanation, Demonstration, Tactile cues, Verbal cues Education comprehension: verbalized understanding, returned demonstration, verbal cues required, tactile cues required, and needs further education     HOME EXERCISE PROGRAM: Access Code: [email or web address] URL: [email or web address] Date: 02/23/2023 Prepared by: Voncille Lo  Exercises - Supine Cervical Retraction with Towel  - 1 x daily - 7 x weekly - 1 sets - 10 reps - Seated  Scapular Retraction  - 1 x daily - 7 x weekly - 2 sets - 8 reps - Gentle Levator Scapulae Stretch  - 1 x daily - 7 x weekly - 1 sets - 3 reps - 30sec hold - Shoulder External Rotation and Scapular Retraction with Resistance  - 2 x daily - 7 x weekly - 3 sets - 10 reps - Scapular Retraction with Resistance  - 2 x daily - 7 x weekly - 3 sets - 10 reps - Shoulder Extension with Resistance - Palms Forward  - 1 x daily - 7 x weekly - 3 sets - 10 reps - Seated Assisted Cervical Rotation with Towel  - 1 x daily - 7 x weekly - 1 sets - 3 reps - 10-15 sec hold - Standing Thoracic Extension at Wall  -  1 x daily - 7 x weekly - 1 sets - 5 reps - 5-10 sec for good pull hold ASSESSMENT:   CLINICAL IMPRESSION 03/11/2023 Pt arrives w/ report of increased shoulder pain yesterday after helping to hold a transmission. He reports he no longer has headaches. He is sleeping better in his bed and not on the couch. He reports improvement in overall condition to allow him to assist with helping with a transmission which he would never have attempted if he was not feeling better. He arrives today with 7/10 pain located right upper trap and posterior shoulder. His AROM overall has improved for shoulder and neck although has been better than measured today, likely due to pain exacerbation. He was unable to tolerate 15# overhead dumb bell in clinic today. Manual STW and to neck and posterior shoulder was performed as well as passive ROM of right shoulder in all planes, all WNL. Pt progress note indicates pt has made progress and is on target to be discharged next visit.     Eval - Patient is a pleasant 62 y.o. gentleman who was seen today for physical therapy evaluation and treatment for chronic neck pain. Red flag questioning is overall reassuring although neuro exam is somewhat confounded by complex medical history (see above, includes CVA and multiple TIAs, reported crushing injury distal RLE several years ago). Pt notes these  symptoms are similar to symptoms prior to last cervical fusion and are limiting his tolerance to daily and work activities. On exam pt demonstrates cervical and GH mobility deficits with corresponding muscular irritation of R LS/UT, deltoid and proximal biceps. RUE numbness able to be resolved w/ pressure on R LS trigger point but returns with cessation of pressure. Increased time w/ history and exam given complex medical hx and symptom irritability, subsequently deferred HEP to next visit. No adverse events, report of muscular fatigue on departure but no overt increase in symptoms. Recommend skilled PT to address aforementioned deficits w/ aim of maximizing pt tolerance to daily activities. Pt departs today's session in no acute distress, all voiced questions/concerns addressed appropriately from PT perspective.     OBJECTIVE IMPAIRMENTS: decreased activity tolerance, decreased endurance, decreased mobility, decreased ROM, decreased strength, hypomobility, increased muscle spasms, impaired UE functional use, postural dysfunction, and pain.    ACTIVITY LIMITATIONS: carrying, lifting, bending, sleeping, and reach over head   PARTICIPATION LIMITATIONS: meal prep, cleaning, laundry, community activity, and occupation   PERSONAL FACTORS: Time since onset of injury/illness/exacerbation and 3+ comorbidities: HTN, CVA, cardiomyopathy, CKD3, DM2  are also affecting patient's functional outcome.    REHAB POTENTIAL: Fair given chronicity and comorbidities   CLINICAL DECISION MAKING: Evolving/moderate complexity   EVALUATION COMPLEXITY: Moderate     GOALS: Goals reviewed with patient? No given time constraints   SHORT TERM GOALS: Target date: 02/16/2023 Pt will demonstrate appropriate understanding and performance of initially prescribed HEP in order to facilitate improved independence with management of symptoms.  Baseline: HEP TBD Goal status: MET   2. Pt will score greater than or equal to 45 on FOTO  in order to demonstrate improved perception of function due to symptoms.            Baseline: 37  03/11/23: 54            Goal status: MET   LONG TERM GOALS: Target date: 03/16/2023 Pt will score 52 on FOTO in order to demonstrate improved perception of function due to symptoms. Baseline: 37 03/11/23: 54 Goal status: MET  2. Pt will demonstrate at least 55 degrees of active cervical rotation to R in order to demonstrate improved environmental awareness and safety with driving.  Baseline: see ROM chart above 03/11/23: 35  Goal status: ONGOING   3. Pt will demonstrate at least 140deg of active R shoulder elevation in order to facilitate improved tolerance w/ household/work tasks Baseline: see ROM chart above 03/11/23: 130 degrees Goal status: ONGOING   4. Pt will report/demonstrate ability to lift up to 10# with less than 2 point increase in resting pain on NPS in order to demonstrate improved tolerance to work activities. Baseline: NT due to symptom irritability 03/11/23: NT due to symptom irritability  Goal status: MET      PLAN:   PT FREQUENCY: 1-2x/week   PT DURATION: 8 weeks   PLANNED INTERVENTIONS: Therapeutic exercises, Therapeutic activity, Neuromuscular re-education, Balance training, Gait training, Patient/Family education, Self Care, Joint mobilization, Vestibular training, Aquatic Therapy, Dry Needling, Electrical stimulation, Spinal mobilization, Cryotherapy, Moist heat, Taping, Manual therapy, and Re-evaluation   PLAN FOR NEXT SESSION: Manual PRN as indicated. Gentle cervical mobility, cervical/periscapular activation exercises as able/appropriate      Hessie Diener, PTA 03/11/23 2:53 PM Phone: [phone] Fax: [phone]   Voncille Lo, [place], [place] Certified Exercise Expert for the Aging Adult  03/11/23 4:09 PM Phone: [phone] Fax: [phone]

## 2023-03-16 ENCOUNTER — Ambulatory Visit: Payer: Medicare HMO | Admitting: Physical Therapy

## 2023-03-16 ENCOUNTER — Encounter: Payer: Self-pay | Admitting: Physical Therapy

## 2023-03-16 DIAGNOSIS — R293 Abnormal posture: Secondary | ICD-10-CM

## 2023-03-16 DIAGNOSIS — M542 Cervicalgia: Secondary | ICD-10-CM | POA: Diagnosis not present

## 2023-03-16 DIAGNOSIS — F330 Major depressive disorder, recurrent, mild: Secondary | ICD-10-CM | POA: Diagnosis not present

## 2023-03-16 DIAGNOSIS — M6281 Muscle weakness (generalized): Secondary | ICD-10-CM

## 2023-03-16 DIAGNOSIS — F411 Generalized anxiety disorder: Secondary | ICD-10-CM | POA: Diagnosis not present

## 2023-03-16 NOTE — Therapy (Addendum)
OUTPATIENT PHYSICAL THERAPY TREATMENT NOTE/Discharge Note PHYSICAL THERAPY DISCHARGE SUMMARY  Visits from Start of Care: 11  Current functional level related to goals / functional outcomes: As indicated below   Remaining deficits: Pt with end range cervical AROM RT 40 and LT 55   Education / Equipment: HEP   Patient agrees to discharge. Patient goals were partially met. Patient is being discharged due to maximized rehab potential.         Patient Name: Nicholas [name] MRN: [id number] DOB:Apr 12, 1961, 62 y.o., male Today's Date: 03/16/2023 6 PCP: Lockie MolaBoyina, Akhila, MD   REFERRING PROVIDER: Latrelle DodrillMcIntyre, Brittany J, MD  END OF SESSION:   PT End of Session - 03/16/23 1605     Visit Number 11    Date for PT Re-Evaluation 03/16/23    Authorization Type [employer]    Authorization Time Period 12 visits 01/25/23-03/13/23    Authorization - Number of Visits 12    Progress Note Due on Visit 10    PT Start Time 1458    PT Stop Time 1512    PT Time Calculation (min) 14 min    Activity Tolerance Patient tolerated treatment well;No increased pain    Behavior During Therapy WFL for tasks assessed/performed              Past Medical History:  Diagnosis Date   Asthma    Bipolar 1 disorder    Borderline glaucoma    Chronic pain    Epididymal pain    LEFT   Epilepsy, 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 67---  LAST SEIZURE 1 WK AGO (APPROX ,  10-31-2013)   NO NEUROLOGIST---  PT SEES PCP  DR Lindajo RoyalAVLOUT   Feeling of incomplete bladder emptying    Frequency of urination    Gastric ulcer    GERD (gastroesophageal reflux disease)    Hypertension    Hyperthyroidism    NO MEDS    Migraine    Seizures    Stroke    TIA (transient ischemic attack)    Type 2 diabetes mellitus    Past Surgical History:  Procedure Laterality Date   ABDOMINAL SURGERY     ANTERIOR CERVICAL DECOMP/DISCECTOMY FUSION  2007   C4  --  C6   CERVICAL FUSION     CHOLECYSTECTOMY     COLONOSCOPY WITH PROPOFOL Left  11/02/2020   Procedure: COLONOSCOPY WITH PROPOFOL;  Surgeon: Willis Modenautlaw, William, MD;  [facility];  Service: Endoscopy;  Laterality: Left;   CYSTOSCOPY N/A 11/09/2013   Procedure: CYSTOSCOPY FLEXIBLE;  Surgeon: Bjorn PippinJohn Wrenn, MD;  [facility] [place];  Service: Urology;  Laterality: N/A;   EPIDIDYMECTOMY Left 11/09/2013   Procedure:  LEFT EPIDIDYMECTOMY;  Surgeon: Bjorn PippinJohn Wrenn, MD;  [facility] [place];  Service: Urology;  Laterality: Left;  POSSIBLE OUTPATIENT WITH OBSERVATION   EXCISION LIPOMA LEFT SHOULDER  2004 (APPROX)   MULTIPLE CYST REMOVED FROM CHEST  AGE 12   OTHER SURGICAL HISTORY     hemorroid surgery    TESTICLE REMOVAL Left    TONSILLECTOMY     Patient Active Problem List   Diagnosis Date Noted   Dizziness 01/02/2023   Bipolar 1 disorder 07/01/2022   Hyperlipidemia 07/01/2022   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 06/27/2022   Metabolic acidosis 06/27/2022   Palpitations 05/08/2022   Seizures 12/03/2020   Rectal bleeding 10/31/2020   Cardiomyopathy 09/23/2020   Atypical chest pain 09/23/2020   Precordial pain    CVA (cerebral vascular accident) 09/02/2020  Seizure 07/22/2020   Left-sided weakness 07/04/2020   Dysarthria 07/04/2020   Essential hypertension    Seizure disorder 07/02/2017   Type 2 diabetes mellitus with diabetic neuropathy, with long-term current use of insulin and hyperglycemia 07/02/2017   Stage 3a chronic kidney disease (CKD) 07/02/2017   Normocytic anemia 07/02/2017   Testicular/scrotal pain 11/09/2013   Microhematuria 11/09/2013   Condyloma acuminatum of scrotum 11/09/2013    REFERRING DIAG: Z98.1 (ICD-10-CM) - History of fusion of cervical spine M54.2 (ICD-10-CM) - Neck pain  THERAPY DIAG:  Abnormal posture  Muscle weakness (generalized)  Cervicalgia  Rationale for Evaluation and Treatment Rehabilitation  PERTINENT HISTORY: HTN, CVA/TIA, cardiomyopathy, DM2, seizures, CKD3, bipolar 1,  migraines, sleep apnea    PRECAUTIONS: seizure precautions, cardiac hx, cervical fusions (C4-T1 per pt), fall risk  SUBJECTIVE:                                                                                                                                                                                      SUBJECTIVE STATEMENT:   03/16/2023  The right shoulder and neck have 3/10 pain.  I feel like I need the dry needling and help.   PAIN:  Are you having pain: Yes :7 /10 NPS on this date Location/description: L neck/shoulder with some referral into UE, describes as fatigued and spasms. Numbness described in radial distribution Best-worst over past week: 0-10/10  Per eval -  - aggravating factors: turning head to R, stretching, sleeping, bending, lifting, twisting - Easing factors: medication, TENS, biofreeze   OBJECTIVE: (objective measures completed at initial evaluation unless otherwise dated)   DIAGNOSTIC FINDINGS:  No recent cervical imaging CTH/MRI head and MRI lumbar spine July 2023, see epic for details   PATIENT SURVEYS:  FOTO 37 current, 52 predicted   COGNITION: Overall cognitive status: Within functional limits for tasks assessed although pt endorses mild memory issues since stroke   SENSATION/NEURO: Light touch intact all extremities but reduced on distal RLE, allodynia on C4-C7 (reports sensory changes after stroke and prior RLE injury) Finger<>nose testing mild incoordination RUE compared to L, no overt ataxia but intermittent dysmetria (pt attributes this to glaucoma issues) No clonus BIL Negative hoffmann and tromner sign BIL No ataxia with gait No aberrant eye movements or symptoms with saccades/smooth pursuits   POSTURE: significant forward head posture, trunk lean to L, increased L UT elevation   PALPATION: Concordant tenderness w/ trigger points R LS/UT, deltoid and biceps. Also able to resolve UE numbness w/ palpation of trigger point in R LS although  returns immediately after cessation of pressure.              CERVICAL  ROM:    Active ROM A/PROM (deg) eval AROM 02-18-23 AROM 03/02/23 AROM 03/11/23 AROM 03-16-23  Flexion 50% *      Extension 75% s      Right lateral flexion        Left lateral flexion        Right rotation 40 deg * 49 45 deg 35 40  Left rotation 60 deg  60 60 deg 55 55   (Blank rows = not tested)   UPPER EXTREMITY ROM:   Active ROM Right eval Left eval Right  02/25/23 03/02/23 AROM  03/11/23 AROM  Shoulder flexion 110 * s  150 deg 130 130 deg pre treatment 140 deg post manual   130  Shoulder abduction       125  Shoulder internal rotation     L5  Reach T2  Shoulder external rotation     T2  Spine scapula Reach Upper trap  Elbow flexion         Elbow extension         Wrist flexion         Wrist extension          (Blank rows = not tested) (Key: WFL = within functional limits not formally assessed, * = concordant pain, s = stiffness/stretching sensation, NT = not tested)  Comments:   UPPER EXTREMITY MMT:   MMT Right eval Left eval  Shoulder flexion      Shoulder extension      Shoulder abduction      Shoulder extension      Shoulder internal rotation      Shoulder external rotation      Elbow flexion      Elbow extension      Grip strength      (Blank rows = not tested)  (Key: WFL = within functional limits not formally assessed, * = concordant pain, s = stiffness/stretching sensation, NT = not tested)  Comments: deferred on eval given symptom irritability   CERVICAL SPECIAL TESTS:  Spurlings: + on R, mild discomfort on L  Sharps Purser: negative   FUNCTIONAL TESTS:  Overhead reach limited as above   TODAY'S TREATMENT:  [facility] Adult PT Treatment:                                                DATE: 03-16-23 AROM in chart Manual Therapy:  STW R upper trap  and LS and scalenes and deltoid/ rhomboid Over flexing neck to max available range. UPA on RT over C- 3, 4 5,  PROM of RT shld Trigger  Point Dry-Needling performed 02-23-23  by Garen Lah Treatment instructions: Expect mild to moderate muscle soreness. S/S of pneumothorax if dry needled over a lung field, and to seek immediate medical attention should they occur. Patient verbalized understanding of these instructions and education. Patient Consent Given: Yes Education handout provided: Previously provided Muscles treated:  RT upper trap and RT levator, Rt rhomboid Electrical stimulation performed: No Parameters: N/A Treatment response/outcome: twitch response noted, pt noted relief  [facility] Adult PT Treatment:                                                DATE:  03/11/23 Therapeutic Exercise: Side lying right ER Side lying right shoulder abduction Supine horizontal abduction Green band  Supine star pattern , Green  Standing 15# dumb bell OH press- unable today due to pain  Manual Therapy: Right GHJ mobs A/P and inferior followed by PROM all planes within normal limits.   Modalities: HMP at end of session to posterior right shoulder Self Care: Theracase for self TPR   [facility] Adult PT Treatment:                                                DATE: 03-04-23 Therapeutic Exercise: Deep neck flexor stretch  with pink foam roller with back and neck aligned in supine and hooklying hold x 10 with VC and TC 15 # DB 3 x 10  Supine on pink foam roller Shoulder ER with scapular retraction-   3 x 10 reps Supine on pink foam roller Shoulder flexion with GTB with scapular retraction-   3 x 10 reps Star pattern on pink foam roller  GTB with horizontal abd and diagonals 2 x 10 Supine on mat Shoulder ER with scapular retraction-   2 x 10 reps Supine on mat Shoulder flexion with GTB with scapular retraction-   2 x 10 reps Star pattern on mat  GTB with horizontal abd and diagonals 2 x 10 SELF CARE  Education on posture and body mechanics using handouts for explanation  Manual Therapy:  STW R upper trap  and LS and scalenes and  deltoid/ rhomboid With pt head over edge of mat,PT provides added extension with firm grip. Over flexing neck to max available range. UPA on RT over C- 3, 4 5, 6 Trigger Point Dry-Needling performed 02-23-23  by Garen Lah Treatment instructions: Expect mild to moderate muscle soreness. S/S of pneumothorax if dry needled over a lung field, and to seek immediate medical attention should they occur. Patient verbalized understanding of these instructions and education. Patient Consent Given: Yes Education handout provided: Previously provided Muscles treated:  RT upper trap and RT levator, Rt rhomboid Electrical stimulation performed: No Parameters: N/A Treatment response/outcome: twitch response noted, pt noted relief  [facility] Adult PT Treatment:                                                DATE: 03/02/23 Therapeutic Exercise: Supine OH flexion w dowel x10 cues for setup Supine cervical rotation BIL x15 cues for appropriate ROM Doorway rhomboid stretch 3x30sec cues for setup  Manual Therapy: Sidelying; STM R rhomboid, mid trap, post delt, infraspinatus, teres, lat. Scapulothoracic mobs all directions to pt tolerance    [facility] Adult PT Treatment:                                                DATE: 02/25/23 Therapeutic Exercise: Supine GTB star patten  Supine shoulder  ER green TB Rhomboid stretch on doorknobs  Single arm rhomboid stretch in doorway  Manual Therapy: Left shoulder GHJ mobilizations followed by PROM  Sidelying scap mobs and STW along medial border of scap    [facility] Adult PT Treatment:  DATE: 02-23-23 Therapeutic Exercise: Post TPDN, lying on back with towel length wise for 5  min with UE overhead for increased extension Deep neck flexor stretch with towel roll and chin tuck 10 sec hold x 10 with VC and TC 15 # DB 3 x 10  Shoulder ER with scapular retraction-   3 x 10 reps Shoulder Extension with GTB Palms Forward  -  3 x  10 reps Star pattern  GTB with horizontal abd and diagonals 2 x 10 Thoracic extension with elbows on wall and flex knees. 2 x 10. Manual Therapy:  STW R upper trap  and LS and scalenes and deltoid/ rhomboid With pt head over edge of mat,PT provides added extension with firm grip. Over flexing neck to max available range. UPA on RT over C- 3, 4 5, 6 Trigger Point Dry-Needling performed 02-23-23  by Garen Lah Treatment instructions: Expect mild to moderate muscle soreness. S/S of pneumothorax if dry needled over a lung field, and to seek immediate medical attention should they occur. Patient verbalized understanding of these instructions and education. Patient Consent Given: Yes Education handout provided: Previously provided Muscles treated:  RT upper trap and RT levator, C 3, 4 ,5 RT cervical paraspinal,  Subocciptial RT Electrical stimulation performed: No Parameters: N/A Treatment response/outcome: twitch response noted, pt noted relief  [facility] Adult PT Treatment:                                                DATE: 02-17-22 Therapeutic Exercise: All exercises done in supine concurrently with moist hot pack Deep neck flexor stretch with towel roll and chin tuck 10 sec hold x 10 with VC and TC  Pt able to hold 40 sec supine deep flexion isometric at end Shoulder ER with scapular retraction-   3 x 10 reps Seated Scapular Retraction  x 10 with VC  Levator  Stretch  reps - 30sec hold x 2 Shoulder Extension with RTB Palms Forward  - 10 reps Manual Therapy:  STW R upper trap and scalenes and deltoid/ rhomboid STM and gentle trigger point release R Levator,  UT,  Trigger Point Dry-Needling performed 02-09-23  by Garen Lah Treatment instructions: Expect mild to moderate muscle soreness. S/S of pneumothorax if dry needled over a lung field, and to seek immediate medical attention should they occur. Patient verbalized understanding of these instructions and education. Patient Consent  Given: Yes Education handout provided: Previously provided Muscles treated:  RT upper trap and RT levator, C 3, 4 ,5 RT cervical paraspinal, R rhomboid, R deltoid Electrical stimulation performed: No Parameters: N/A Treatment response/outcome: twitch response noted, pt noted relief Modalities  moist cervical hot pack  [facility] Adult PT Treatment:                                                DATE: 02/16/23 Manual Therapy: Seated: STM R LS, UT, rhomboid, infraspinatus, posterior deltoid; trigger point release LS and UT  Therapeutic Activity: Discussion re: activity modification, relevant anatomy/physiology as it pertains to activity, PT POC and rationale for interventions Pt politely declines exercise on this date      PATIENT EDUCATION:  Education details: rationale for interventions, PT POC Person educated: Patient Education  method: Explanation, Demonstration, Tactile cues, Verbal cues Education comprehension: verbalized understanding, returned demonstration, verbal cues required, tactile cues required, and needs further education     HOME EXERCISE PROGRAM: Access Code: [email or web address] URL: [email or web address] Date: 02/23/2023 Prepared by: Garen Lah  Exercises - Supine Cervical Retraction with Towel  - 1 x daily - 7 x weekly - 1 sets - 10 reps - Seated Scapular Retraction  - 1 x daily - 7 x weekly - 2 sets - 8 reps - Gentle Levator Scapulae Stretch  - 1 x daily - 7 x weekly - 1 sets - 3 reps - 30sec hold - Shoulder External Rotation and Scapular Retraction with Resistance  - 2 x daily - 7 x weekly - 3 sets - 10 reps - Scapular Retraction with Resistance  - 2 x daily - 7 x weekly - 3 sets - 10 reps - Shoulder Extension with Resistance - Palms Forward  - 1 x daily - 7 x weekly - 3 sets - 10 reps - Seated Assisted Cervical Rotation with Towel  - 1 x daily - 7 x weekly - 1 sets - 3 reps - 10-15 sec hold - Standing Thoracic Extension at Wall  - 1 x daily - 7 x weekly -  1 sets - 5 reps - 5-10 sec for good pull hold ASSESSMENT:   CLINICAL IMPRESSION 03/16/2023  Pt reports RT neck/shld with 3/ 10 and he consents to [facility] which has brought great relief during PT.  He reports he no longer has headaches. He is sleeping better in his bed and not on the couch. He reports improvement in overall condition to allow him to assist with helping with a transmission which he would never have attempted last week if he was not feeling better. He arrives today with 3/10 pain located right upper trap and posterior shoulder. His AROM overall has improved likely due to pain exacerbation. Manual STW and to neck and posterior shoulder was performed as well as passive ROM of right shoulder in all planes, all WNL.  Due to pt LTG all met or partially met.  Pt agrees to DC    Eval - Patient is a pleasant 62 y.o. gentleman who was seen today for physical therapy evaluation and treatment for chronic neck pain. Red flag questioning is overall reassuring although neuro exam is somewhat confounded by complex medical history (see above, includes CVA and multiple TIAs, reported crushing injury distal RLE several years ago). Pt notes these symptoms are similar to symptoms prior to last cervical fusion and are limiting his tolerance to daily and work activities. On exam pt demonstrates cervical and GH mobility deficits with corresponding muscular irritation of R LS/UT, deltoid and proximal biceps. RUE numbness able to be resolved w/ pressure on R LS trigger point but returns with cessation of pressure. Increased time w/ history and exam given complex medical hx and symptom irritability, subsequently deferred HEP to next visit. No adverse events, report of muscular fatigue on departure but no overt increase in symptoms. Recommend skilled PT to address aforementioned deficits w/ aim of maximizing pt tolerance to daily activities. Pt departs today's session in no acute distress, all voiced questions/concerns  addressed appropriately from PT perspective.     OBJECTIVE IMPAIRMENTS: decreased activity tolerance, decreased endurance, decreased mobility, decreased ROM, decreased strength, hypomobility, increased muscle spasms, impaired UE functional use, postural dysfunction, and pain.    ACTIVITY LIMITATIONS: carrying, lifting, bending, sleeping, and reach over head   PARTICIPATION LIMITATIONS: meal prep, cleaning,  laundry, community activity, and occupation   PERSONAL FACTORS: Time since onset of injury/illness/exacerbation and 3+ comorbidities: HTN, CVA, cardiomyopathy, CKD3, DM2  are also affecting patient's functional outcome.    REHAB POTENTIAL: Fair given chronicity and comorbidities   CLINICAL DECISION MAKING: Evolving/moderate complexity   EVALUATION COMPLEXITY: Moderate     GOALS: Goals reviewed with patient? No given time constraints   SHORT TERM GOALS: Target date: 02/16/2023 Pt will demonstrate appropriate understanding and performance of initially prescribed HEP in order to facilitate improved independence with management of symptoms.  Baseline: HEP TBD Goal status: MET   2. Pt will score greater than or equal to 45 on FOTO in order to demonstrate improved perception of function due to symptoms.            Baseline: 37  03/11/23: 54            Goal status: MET   LONG TERM GOALS: Target date: 03/16/2023 Pt will score 52 on FOTO in order to demonstrate improved perception of function due to symptoms. Baseline: 37 03/11/23: 54 Goal status: MET   2. Pt will demonstrate at least 55 degrees of active cervical rotation to R in order to demonstrate improved environmental awareness and safety with driving.  Baseline: see ROM chart above 03/16/23: 40 Goal status: Partially MET   3. Pt will demonstrate at least 140deg of active R shoulder elevation in order to facilitate improved tolerance w/ household/work tasks Baseline: see ROM chart above 03/11/23: 130 degrees Goal status:Partially  MET   4. Pt will report/demonstrate ability to lift up to 10# with less than 2 point increase in resting pain on NPS in order to demonstrate improved tolerance to work activities. Baseline: NT due to symptom irritability 03/11/23: NT due to symptom irritability  Goal status: MET      PLAN:   PT FREQUENCY: 1-2x/week   PT DURATION: 8 weeks   PLANNED INTERVENTIONS: Therapeutic exercises, Therapeutic activity, Neuromuscular re-education, Balance training, Gait training, Patient/Family education, Self Care, Joint mobilization, Vestibular training, Aquatic Therapy, Dry Needling, Electrical stimulation, Spinal mobilization, Cryotherapy, Moist heat, Taping, Manual therapy, and Re-evaluation   PLAN FOR NEXT SESSION: Manual PRN as indicated. Gentle cervical mobility, cervical/periscapular activation exercises as able/appropriate      Garen Lah, PT, ATRIC Certified Exercise Expert for the Aging Adult  03/16/23 4:30 PM Phone: [phone] Fax: [phone]   Garen Lah, PT, [employer] Certified Exercise Expert for the Aging Adult  03/31/23 2:47 PM Phone: [phone] Fax: [phone]

## 2023-03-16 NOTE — Therapy (Deleted)
OUTPATIENT PHYSICAL THERAPY TREATMENT NOTE       Patient Name: Nicholas [name] MRN: [id number] DOB:September 15, 1961, 62 y.o., male Today's Date: 03/16/2023 6 PCP: Lockie Mola, MD   REFERRING PROVIDER: Latrelle Dodrill, MD  END OF SESSION:   PT End of Session - 03/16/23 1605     Visit Number 11    Date for PT Re-Evaluation 03/16/23    Authorization Type [employer]    Authorization Time Period 12 visits 01/25/23-03/13/23    Authorization - Number of Visits 12    Progress Note Due on Visit 10    PT Start Time 1458    PT Stop Time 1512    PT Time Calculation (min) 14 min    Activity Tolerance Patient tolerated treatment well;No increased pain    Behavior During Therapy WFL for tasks assessed/performed              Past Medical History:  Diagnosis Date   Asthma    Bipolar 1 disorder    Borderline glaucoma    Chronic pain    Epididymal pain    LEFT   Epilepsy, 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 75---  LAST SEIZURE 1 WK AGO (APPROX ,  10-31-2013)   NO NEUROLOGIST---  PT SEES PCP  DR Lindajo Royal   Feeling of incomplete bladder emptying    Frequency of urination    Gastric ulcer    GERD (gastroesophageal reflux disease)    Hypertension    Hyperthyroidism    NO MEDS    Migraine    Seizures    Stroke    TIA (transient ischemic attack)    Type 2 diabetes mellitus    Past Surgical History:  Procedure Laterality Date   ABDOMINAL SURGERY     ANTERIOR CERVICAL DECOMP/DISCECTOMY FUSION  2007   C4  --  C6   CERVICAL FUSION     CHOLECYSTECTOMY     COLONOSCOPY WITH PROPOFOL Left 11/02/2020   Procedure: COLONOSCOPY WITH PROPOFOL;  Surgeon: Willis Modena, MD;  Location: [facility] ENDOSCOPY;  Service: Endoscopy;  Laterality: Left;   CYSTOSCOPY N/A 11/09/2013   Procedure: CYSTOSCOPY FLEXIBLE;  Surgeon: Bjorn Pippin, MD;  Location: [facility];  Service: Urology;  Laterality: N/A;   EPIDIDYMECTOMY Left 11/09/2013   Procedure:  LEFT EPIDIDYMECTOMY;  Surgeon: Bjorn Pippin, MD;   Location: [facility];  Service: Urology;  Laterality: Left;  POSSIBLE OUTPATIENT WITH OBSERVATION   EXCISION LIPOMA LEFT SHOULDER  2004 (APPROX)   MULTIPLE CYST REMOVED FROM CHEST  AGE 14   OTHER SURGICAL HISTORY     hemorroid surgery    TESTICLE REMOVAL Left    TONSILLECTOMY     Patient Active Problem List   Diagnosis Date Noted   Dizziness 01/02/2023   Bipolar 1 disorder 07/01/2022   Hyperlipidemia 07/01/2022   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 06/27/2022   Metabolic acidosis 06/27/2022   Palpitations 05/08/2022   Seizures 12/03/2020   Rectal bleeding 10/31/2020   Cardiomyopathy 09/23/2020   Atypical chest pain 09/23/2020   Precordial pain    CVA (cerebral vascular accident) 09/02/2020   Seizure 07/22/2020   Left-sided weakness 07/04/2020   Dysarthria 07/04/2020   Essential hypertension    Seizure disorder 07/02/2017   Type 2 diabetes mellitus with diabetic neuropathy, with long-term current use of insulin and hyperglycemia 07/02/2017   Stage 3a chronic kidney disease (CKD) 07/02/2017   Normocytic anemia 07/02/2017   Testicular/scrotal pain 11/09/2013   Microhematuria 11/09/2013   Condyloma acuminatum  of scrotum 11/09/2013    REFERRING DIAG: Z98.1 (ICD-10-CM) - History of fusion of cervical spine M54.2 (ICD-10-CM) - Neck pain  THERAPY DIAG:  Abnormal posture  Muscle weakness (generalized)  Cervicalgia  Rationale for Evaluation and Treatment Rehabilitation  PERTINENT HISTORY: HTN, CVA/TIA, cardiomyopathy, DM2, seizures, CKD3, bipolar 1, migraines, sleep apnea    PRECAUTIONS: seizure precautions, cardiac hx, cervical fusions (C4-T1 per pt), fall risk  SUBJECTIVE:                                                                                                                                                                                      SUBJECTIVE STATEMENT:   03/16/2023  The right shoulder and upper trap are in pain and throbbing.     PAIN:  Are you having pain: Yes :7 /10 NPS on this date Location/description: L neck/shoulder with some referral into UE, describes as fatigued and spasms. Numbness described in radial distribution Best-worst over past week: 0-10/10  Per eval -  - aggravating factors: turning head to R, stretching, sleeping, bending, lifting, twisting - Easing factors: medication, TENS, biofreeze   OBJECTIVE: (objective measures completed at initial evaluation unless otherwise dated)   DIAGNOSTIC FINDINGS:  No recent cervical imaging CTH/MRI head and MRI lumbar spine July 2023, see epic for details   PATIENT SURVEYS:  FOTO 37 current, 52 predicted   COGNITION: Overall cognitive status: Within functional limits for tasks assessed although pt endorses mild memory issues since stroke   SENSATION/NEURO: Light touch intact all extremities but reduced on distal RLE, allodynia on C4-C7 (reports sensory changes after stroke and prior RLE injury) Finger<>nose testing mild incoordination RUE compared to L, no overt ataxia but intermittent dysmetria (pt attributes this to glaucoma issues) No clonus BIL Negative hoffmann and tromner sign BIL No ataxia with gait No aberrant eye movements or symptoms with saccades/smooth pursuits   POSTURE: significant forward head posture, trunk lean to L, increased L UT elevation   PALPATION: Concordant tenderness w/ trigger points R LS/UT, deltoid and biceps. Also able to resolve UE numbness w/ palpation of trigger point in R LS although returns immediately after cessation of pressure.              CERVICAL ROM:    Active ROM A/PROM (deg) eval AROM 02-18-23 AROM 03/02/23 AROM 03/11/23 AROM 03-16-23  Flexion 50% *      Extension 75% s      Right lateral flexion        Left lateral flexion        Right rotation 40 deg * 49 45 deg 35 40  Left rotation 60 deg  60 60 deg 55  55   (Blank rows = not tested)   UPPER EXTREMITY ROM:   Active ROM Right eval Left eval  Right  02/25/23 03/02/23 AROM  03/11/23 AROM   Shoulder flexion 110 * s  150 deg 130 130 deg pre treatment 140 deg post manual   130   Shoulder abduction       125   Shoulder internal rotation     L5  Reach T2   Shoulder external rotation     T2  Spine scapula Reach Upper trap   Elbow flexion          Elbow extension          Wrist flexion          Wrist extension           (Blank rows = not tested) (Key: WFL = within functional limits not formally assessed, * = concordant pain, s = stiffness/stretching sensation, NT = not tested)  Comments:   UPPER EXTREMITY MMT:   MMT Right eval Left eval  Shoulder flexion      Shoulder extension      Shoulder abduction      Shoulder extension      Shoulder internal rotation      Shoulder external rotation      Elbow flexion      Elbow extension      Grip strength      (Blank rows = not tested)  (Key: WFL = within functional limits not formally assessed, * = concordant pain, s = stiffness/stretching sensation, NT = not tested)  Comments: deferred on eval given symptom irritability   CERVICAL SPECIAL TESTS:  Spurlings: + on R, mild discomfort on L  Sharps Purser: negative   FUNCTIONAL TESTS:  Overhead reach limited as above   TODAY'S TREATMENT:  [facility] Adult PT Treatment:                                                DATE: 03-16-23 Therapeutic Exercise: *** Manual Therapy:  STW R upper trap  and LS and scalenes and deltoid/ rhomboid Over flexing neck to max available range. UPA on RT over C- 3, 4 5, 6 Trigger Point Dry-Needling performed 02-23-23  by Garen Lah Treatment instructions: Expect mild to moderate muscle soreness. S/S of pneumothorax if dry needled over a lung field, and to seek immediate medical attention should they occur. Patient verbalized understanding of these instructions and education. Patient Consent Given: Yes Education handout provided: Previously provided Muscles treated:  RT upper trap and RT levator, Rt  rhomboid Electrical stimulation performed: No Parameters: N/A Treatment response/outcome: twitch response noted, pt noted relief  [facility] Adult PT Treatment:                                                DATE: 03/11/23 Therapeutic Exercise: Side lying right ER Side lying right shoulder abduction Supine horizontal abduction Green band  Supine star pattern , Green  Standing 15# dumb bell OH press- unable today due to pain  Manual Therapy: Right GHJ mobs A/P and inferior followed by PROM all planes within normal limits.   Modalities: HMP at end of session to posterior right shoulder Self Care: Theracase for  self TPR   [facility] Adult PT Treatment:                                                DATE: 03-04-23 Therapeutic Exercise: Deep neck flexor stretch  with pink foam roller with back and neck aligned in supine and hooklying hold x 10 with VC and TC 15 # DB 3 x 10  Supine on pink foam roller Shoulder ER with scapular retraction-   3 x 10 reps Supine on pink foam roller Shoulder flexion with GTB with scapular retraction-   3 x 10 reps Star pattern on pink foam roller  GTB with horizontal abd and diagonals 2 x 10 Supine on mat Shoulder ER with scapular retraction-   2 x 10 reps Supine on mat Shoulder flexion with GTB with scapular retraction-   2 x 10 reps Star pattern on mat  GTB with horizontal abd and diagonals 2 x 10 SELF CARE  Education on posture and body mechanics using handouts for explanation  Manual Therapy:  STW R upper trap  and LS and scalenes and deltoid/ rhomboid With pt head over edge of mat,PT provides added extension with firm grip. Over flexing neck to max available range. UPA on RT over C- 3, 4 5, 6 Trigger Point Dry-Needling performed 02-23-23  by Garen Lah Treatment instructions: Expect mild to moderate muscle soreness. S/S of pneumothorax if dry needled over a lung field, and to seek immediate medical attention should they occur. Patient verbalized understanding  of these instructions and education. Patient Consent Given: Yes Education handout provided: Previously provided Muscles treated:  RT upper trap and RT levator, Rt rhomboid Electrical stimulation performed: No Parameters: N/A Treatment response/outcome: twitch response noted, pt noted relief  [facility] Adult PT Treatment:                                                DATE: 03/02/23 Therapeutic Exercise: Supine OH flexion w dowel x10 cues for setup Supine cervical rotation BIL x15 cues for appropriate ROM Doorway rhomboid stretch 3x30sec cues for setup  Manual Therapy: Sidelying; STM R rhomboid, mid trap, post delt, infraspinatus, teres, lat. Scapulothoracic mobs all directions to pt tolerance    [facility] Adult PT Treatment:                                                DATE: 02/25/23 Therapeutic Exercise: Supine GTB star patten  Supine shoulder  ER green TB Rhomboid stretch on doorknobs  Single arm rhomboid stretch in doorway  Manual Therapy: Left shoulder GHJ mobilizations followed by PROM  Sidelying scap mobs and STW along medial border of scap    [facility] Adult PT Treatment:                                                DATE: 02-23-23 Therapeutic Exercise: Post TPDN, lying on back with towel length wise for 5  min with UE overhead for  increased extension Deep neck flexor stretch with towel roll and chin tuck 10 sec hold x 10 with VC and TC 15 # DB 3 x 10  Shoulder ER with scapular retraction-   3 x 10 reps Shoulder Extension with GTB Palms Forward  -  3 x 10 reps Star pattern  GTB with horizontal abd and diagonals 2 x 10 Thoracic extension with elbows on wall and flex knees. 2 x 10. Manual Therapy:  STW R upper trap  and LS and scalenes and deltoid/ rhomboid With pt head over edge of mat,PT provides added extension with firm grip. Over flexing neck to max available range. UPA on RT over C- 3, 4 5, 6 Trigger Point Dry-Needling performed 02-23-23  by Garen Lah Treatment  instructions: Expect mild to moderate muscle soreness. S/S of pneumothorax if dry needled over a lung field, and to seek immediate medical attention should they occur. Patient verbalized understanding of these instructions and education. Patient Consent Given: Yes Education handout provided: Previously provided Muscles treated:  RT upper trap and RT levator, C 3, 4 ,5 RT cervical paraspinal,  Subocciptial RT Electrical stimulation performed: No Parameters: N/A Treatment response/outcome: twitch response noted, pt noted relief  [facility] Adult PT Treatment:                                                DATE: 02-17-22 Therapeutic Exercise: All exercises done in supine concurrently with moist hot pack Deep neck flexor stretch with towel roll and chin tuck 10 sec hold x 10 with VC and TC  Pt able to hold 40 sec supine deep flexion isometric at end Shoulder ER with scapular retraction-   3 x 10 reps Seated Scapular Retraction  x 10 with VC  Levator  Stretch  reps - 30sec hold x 2 Shoulder Extension with RTB Palms Forward  - 10 reps Manual Therapy:  STW R upper trap and scalenes and deltoid/ rhomboid STM and gentle trigger point release R Levator,  UT,  Trigger Point Dry-Needling performed 02-09-23  by Garen Lah Treatment instructions: Expect mild to moderate muscle soreness. S/S of pneumothorax if dry needled over a lung field, and to seek immediate medical attention should they occur. Patient verbalized understanding of these instructions and education. Patient Consent Given: Yes Education handout provided: Previously provided Muscles treated:  RT upper trap and RT levator, C 3, 4 ,5 RT cervical paraspinal, R rhomboid, R deltoid Electrical stimulation performed: No Parameters: N/A Treatment response/outcome: twitch response noted, pt noted relief Modalities  moist cervical hot pack  [facility] Adult PT Treatment:                                                DATE: 02/16/23 Manual  Therapy: Seated: STM R LS, UT, rhomboid, infraspinatus, posterior deltoid; trigger point release LS and UT  Therapeutic Activity: Discussion re: activity modification, relevant anatomy/physiology as it pertains to activity, PT POC and rationale for interventions Pt politely declines exercise on this date      PATIENT EDUCATION:  Education details: rationale for interventions, PT POC Person educated: Patient Education method: Explanation, Demonstration, Tactile cues, Verbal cues Education comprehension: verbalized understanding, returned demonstration, verbal cues required, tactile cues required, and needs  further education     HOME EXERCISE PROGRAM: Access Code: [email or web address] URL: [email or web address] Date: 02/23/2023 Prepared by: Garen Lah  Exercises - Supine Cervical Retraction with Towel  - 1 x daily - 7 x weekly - 1 sets - 10 reps - Seated Scapular Retraction  - 1 x daily - 7 x weekly - 2 sets - 8 reps - Gentle Levator Scapulae Stretch  - 1 x daily - 7 x weekly - 1 sets - 3 reps - 30sec hold - Shoulder External Rotation and Scapular Retraction with Resistance  - 2 x daily - 7 x weekly - 3 sets - 10 reps - Scapular Retraction with Resistance  - 2 x daily - 7 x weekly - 3 sets - 10 reps - Shoulder Extension with Resistance - Palms Forward  - 1 x daily - 7 x weekly - 3 sets - 10 reps - Seated Assisted Cervical Rotation with Towel  - 1 x daily - 7 x weekly - 1 sets - 3 reps - 10-15 sec hold - Standing Thoracic Extension at Wall  - 1 x daily - 7 x weekly - 1 sets - 5 reps - 5-10 sec for good pull hold ASSESSMENT:   CLINICAL IMPRESSION 03/16/2023 Pt arrives w/ report of increased shoulder pain yesterday after helping to hold a transmission. He reports he no longer has headaches. He is sleeping better in his bed and not on the couch. He reports improvement in overall condition to allow him to assist with helping with a transmission which he would never have attempted if  he was not feeling better. He arrives today with 7/10 pain located right upper trap and posterior shoulder. His AROM overall has improved for shoulder and neck although has been better than measured today, likely due to pain exacerbation. He was unable to tolerate 15# overhead dumb bell in clinic today. Manual STW and to neck and posterior shoulder was performed as well as passive ROM of right shoulder in all planes, all WNL. Pt progress note indicates pt has made progress and is on target to be discharged next visit.     Eval - Patient is a pleasant 61 y.o. gentleman who was seen today for physical therapy evaluation and treatment for chronic neck pain. Red flag questioning is overall reassuring although neuro exam is somewhat confounded by complex medical history (see above, includes CVA and multiple TIAs, reported crushing injury distal RLE several years ago). Pt notes these symptoms are similar to symptoms prior to last cervical fusion and are limiting his tolerance to daily and work activities. On exam pt demonstrates cervical and GH mobility deficits with corresponding muscular irritation of R LS/UT, deltoid and proximal biceps. RUE numbness able to be resolved w/ pressure on R LS trigger point but returns with cessation of pressure. Increased time w/ history and exam given complex medical hx and symptom irritability, subsequently deferred HEP to next visit. No adverse events, report of muscular fatigue on departure but no overt increase in symptoms. Recommend skilled PT to address aforementioned deficits w/ aim of maximizing pt tolerance to daily activities. Pt departs today's session in no acute distress, all voiced questions/concerns addressed appropriately from PT perspective.     OBJECTIVE IMPAIRMENTS: decreased activity tolerance, decreased endurance, decreased mobility, decreased ROM, decreased strength, hypomobility, increased muscle spasms, impaired UE functional use, postural dysfunction, and  pain.    ACTIVITY LIMITATIONS: carrying, lifting, bending, sleeping, and reach over head   PARTICIPATION LIMITATIONS: meal prep, cleaning, laundry, community activity,  and occupation   PERSONAL FACTORS: Time since onset of injury/illness/exacerbation and 3+ comorbidities: HTN, CVA, cardiomyopathy, CKD3, DM2  are also affecting patient's functional outcome.    REHAB POTENTIAL: Fair given chronicity and comorbidities   CLINICAL DECISION MAKING: Evolving/moderate complexity   EVALUATION COMPLEXITY: Moderate     GOALS: Goals reviewed with patient? No given time constraints   SHORT TERM GOALS: Target date: 02/16/2023 Pt will demonstrate appropriate understanding and performance of initially prescribed HEP in order to facilitate improved independence with management of symptoms.  Baseline: HEP TBD Goal status: MET   2. Pt will score greater than or equal to 45 on FOTO in order to demonstrate improved perception of function due to symptoms.            Baseline: 37  03/11/23: 54            Goal status: MET   LONG TERM GOALS: Target date: 03/16/2023 Pt will score 52 on FOTO in order to demonstrate improved perception of function due to symptoms. Baseline: 37 03/11/23: 54 Goal status: MET   2. Pt will demonstrate at least 55 degrees of active cervical rotation to R in order to demonstrate improved environmental awareness and safety with driving.  Baseline: see ROM chart above 03/11/23: 35  Goal status: ONGOING   3. Pt will demonstrate at least 140deg of active R shoulder elevation in order to facilitate improved tolerance w/ household/work tasks Baseline: see ROM chart above 03/11/23: 130 degrees Goal status: ONGOING   4. Pt will report/demonstrate ability to lift up to 10# with less than 2 point increase in resting pain on NPS in order to demonstrate improved tolerance to work activities. Baseline: NT due to symptom irritability 03/11/23: NT due to symptom irritability  Goal status: MET       PLAN:   PT FREQUENCY: 1-2x/week   PT DURATION: 8 weeks   PLANNED INTERVENTIONS: Therapeutic exercises, Therapeutic activity, Neuromuscular re-education, Balance training, Gait training, Patient/Family education, Self Care, Joint mobilization, Vestibular training, Aquatic Therapy, Dry Needling, Electrical stimulation, Spinal mobilization, Cryotherapy, Moist heat, Taping, Manual therapy, and Re-evaluation   PLAN FOR NEXT SESSION: Manual PRN as indicated. Gentle cervical mobility, cervical/periscapular activation exercises as able/appropriate     Garen Lah, PT, ATRIC Certified Exercise Expert for the Aging Adult  03/16/23 4:34 PM Phone: [phone] Fax: [phone]

## 2023-03-19 ENCOUNTER — Encounter: Payer: Medicare HMO | Admitting: Physical Therapy

## 2023-03-23 DIAGNOSIS — K581 Irritable bowel syndrome with constipation: Secondary | ICD-10-CM | POA: Diagnosis not present

## 2023-04-06 ENCOUNTER — Other Ambulatory Visit (HOSPITAL_BASED_OUTPATIENT_CLINIC_OR_DEPARTMENT_OTHER): Payer: Self-pay

## 2023-04-06 DIAGNOSIS — M25511 Pain in right shoulder: Secondary | ICD-10-CM | POA: Diagnosis not present

## 2023-04-06 DIAGNOSIS — M961 Postlaminectomy syndrome, not elsewhere classified: Secondary | ICD-10-CM | POA: Diagnosis not present

## 2023-04-06 DIAGNOSIS — I429 Cardiomyopathy, unspecified: Secondary | ICD-10-CM | POA: Diagnosis not present

## 2023-04-06 DIAGNOSIS — Z5181 Encounter for therapeutic drug level monitoring: Secondary | ICD-10-CM | POA: Diagnosis not present

## 2023-04-06 DIAGNOSIS — M542 Cervicalgia: Secondary | ICD-10-CM | POA: Diagnosis not present

## 2023-04-06 MED ORDER — OXYCODONE-ACETAMINOPHEN 10-325 MG PO TABS
1.0000 | ORAL_TABLET | Freq: Four times a day (QID) | ORAL | 0 refills | Status: DC
  Filled 2023-04-06: qty 120, 30d supply, fill #0
  Filled ????-??-??: fill #0

## 2023-04-12 ENCOUNTER — Encounter: Payer: Self-pay | Admitting: Family Medicine

## 2023-04-12 ENCOUNTER — Ambulatory Visit (INDEPENDENT_AMBULATORY_CARE_PROVIDER_SITE_OTHER): Payer: Medicare HMO | Admitting: Family Medicine

## 2023-04-12 ENCOUNTER — Other Ambulatory Visit: Payer: Self-pay | Admitting: Family Medicine

## 2023-04-12 VITALS — BP 102/70 | HR 61 | Ht 67.0 in | Wt 186.8 lb

## 2023-04-12 DIAGNOSIS — Z114 Encounter for screening for human immunodeficiency virus [HIV]: Secondary | ICD-10-CM | POA: Diagnosis not present

## 2023-04-12 DIAGNOSIS — J449 Chronic obstructive pulmonary disease, unspecified: Secondary | ICD-10-CM | POA: Diagnosis not present

## 2023-04-12 DIAGNOSIS — I10 Essential (primary) hypertension: Secondary | ICD-10-CM

## 2023-04-12 DIAGNOSIS — E1169 Type 2 diabetes mellitus with other specified complication: Secondary | ICD-10-CM

## 2023-04-12 DIAGNOSIS — R0789 Other chest pain: Secondary | ICD-10-CM

## 2023-04-12 DIAGNOSIS — I639 Cerebral infarction, unspecified: Secondary | ICD-10-CM

## 2023-04-12 DIAGNOSIS — Z794 Long term (current) use of insulin: Secondary | ICD-10-CM

## 2023-04-12 DIAGNOSIS — E1140 Type 2 diabetes mellitus with diabetic neuropathy, unspecified: Secondary | ICD-10-CM

## 2023-04-12 DIAGNOSIS — Z1159 Encounter for screening for other viral diseases: Secondary | ICD-10-CM | POA: Diagnosis not present

## 2023-04-12 DIAGNOSIS — E785 Hyperlipidemia, unspecified: Secondary | ICD-10-CM | POA: Diagnosis not present

## 2023-04-12 DIAGNOSIS — R079 Chest pain, unspecified: Secondary | ICD-10-CM | POA: Insufficient documentation

## 2023-04-12 MED ORDER — CLOPIDOGREL BISULFATE 75 MG PO TABS: 75.0000 mg | ORAL_TABLET | Freq: Every day | ORAL | Status: DC

## 2023-04-12 MED ORDER — BUDESONIDE-FORMOTEROL FUMARATE 80-4.5 MCG/ACT IN AERO: 2.0000 | INHALATION_SPRAY | Freq: Every day | RESPIRATORY_TRACT | 2 refills | Status: AC

## 2023-04-12 MED ORDER — FENOFIBRATE 145 MG PO TABS
145.0000 mg | ORAL_TABLET | Freq: Every day | ORAL | 0 refills | Status: DC
Start: 2023-04-12 — End: 2023-04-15

## 2023-04-12 MED ORDER — VERAPAMIL HCL ER 100 MG PO CP24: ORAL_CAPSULE | ORAL | 1 refills | Status: AC

## 2023-04-12 NOTE — Assessment & Plan Note (Signed)
Concern for atypical chest pain. No pain at the time of exam. However, with history of previous cocaine use, CVA, and HTN at high risk for stenosis or worsening cardiomyopathy.  - Refer to cardiology and consider stress test.

## 2023-04-12 NOTE — Progress Notes (Signed)
    SUBJECTIVE:   CHIEF COMPLAINT / HPI:   Neck Pain  Patient says that he still has neck and shoulder pain after trying physical therapy. He did have relief after the dry needling; however, it was short lived for only about 24 hours each time. Says he feels like the pain is more muscle pain. His percocet and tylenol help with pain, but says he still feels muscle tension. Has not tried heating pad. Also reports that he is going to have a neck and shoulder MRI done by [facility].   Chest Pain  Patient reports that he has occasional tightness in his chest where he feels like its more difficult to breath. This is not necessarily related to exertion. This sometimes happens when he first wakes up or sometimes when he is just sitting. He says sometimes the pain radiates down his left arm. Pain lasts a few minutes. Has not tried medication for this. No pain today.   HTN  Patient says that he ran out of the decreased dose of antihypertensive. Thus, he started taking the old increased dose of his antihypertensive. He says when he does this he feels like he gets more dizzy when standing up.   PERTINENT  PMH / PSH: HTN, h/o CVA, cardiomyopathy, bipolar 1   OBJECTIVE:   BP 102/70   Pulse 61   Ht 5\' 7"  (1.702 m)   Wt 186 lb 12.8 oz (84.7 kg)   SpO2 98%   BMI 29.26 kg/m   General: well appearing, in no acute distress CV: RRR, radial pulses equal and palpable, no BLE edema  Resp: Normal work of breathing on room air, CTAB Abd: Soft, non tender, non distended  Neuro: Alert & Oriented x 4  Foot: L foot with decreased sensation on medial side of foot and over large toe. Otherwise normal sensation. Small abrasion and callus on medial side of MCP joint of large toe on L and R feet. Normal sensation of R foot. No other ulcers, or lesions.  MSK: No point tenderness over cervical spine, no hypercontraction or tense trapezius muscles. Pain to palpation in between neck and shoulder, no obvious deformity,  edema, etc. Normal ROM of shoulder.   ASSESSMENT/PLAN:   Essential hypertension Assessment & Plan: Refilled verapmil 100 mg. This would be ideal for his HTN and would decrease risk of orthostatic hypertension.   Orders: -     Verapamil HCl ER; TAKE 1 CAPSULE(100 MG) BY MOUTH AT BEDTIME  Dispense: 90 capsule; Refill: 1 -     Comprehensive metabolic panel; Future  Type 2 diabetes mellitus with diabetic neuropathy, with long-term current use of insulin and hyperglycemia -     Ambulatory referral to Ophthalmology -     Microalbumin / creatinine urine ratio; Future -     POCT glycosylated hemoglobin (Hb A1C); Future - Has Endo appointment this month   Neck Pain  What patient describes as neck pain is more like shoulder pain. There does not seem to be increased spasticity or tension of the muscles. No radicular type pain on history. Robaxin did not work for patient previously due to itching. Given that patient is on a multitude of pain medication of different mechanisms will avoid overcomplicating regimen given that it is not affecting his functionality.  - Patient will follow up with sports med for the MRI    Lockie Mola, MD [facility]

## 2023-04-12 NOTE — Assessment & Plan Note (Signed)
Refilled verapmil 100 mg. This would be ideal for his HTN and would decrease risk of orthostatic hypertension.

## 2023-04-12 NOTE — Patient Instructions (Signed)
It was wonderful to see you today.  Please bring ALL of your medications with you to every visit.   Today we talked about:  Blood pressure - Take the lower dose of the blood pressure medicine 100 mg  Please pick up your phone because someone from the eye doctor will call you    Please follow up in 1 month  Thank you for choosing [facility].   Please call [phone] with any questions about today's appointment.  Please be sure to schedule follow up at the front desk before you leave today.   Lockie Mola, MD  Family Medicine    Medicare Annual Wellness Exam Your chart indicates that you are due for your Medicare annual wellness exam.  Your insurance likes [place] to do one of these every year.  This is a nurse only visit that takes about 30 minutes to an hour.  It can be done either in person or virtually.  This is an in-depth visit that focuses on preventative care and keeping you healthy.  Somebody from our clinic will be calling you soon to get this scheduled.

## 2023-04-13 ENCOUNTER — Other Ambulatory Visit: Payer: Medicare HMO

## 2023-04-13 ENCOUNTER — Telehealth: Payer: Self-pay | Admitting: Family Medicine

## 2023-04-13 DIAGNOSIS — M542 Cervicalgia: Secondary | ICD-10-CM

## 2023-04-13 NOTE — Telephone Encounter (Signed)
Patient walked in stating the wrong medication was sent in. Need Rx for muscle relaxer only  He found his BP medication. Eye drops not needed

## 2023-04-14 ENCOUNTER — Other Ambulatory Visit: Payer: Medicare HMO

## 2023-04-14 DIAGNOSIS — E1140 Type 2 diabetes mellitus with diabetic neuropathy, unspecified: Secondary | ICD-10-CM | POA: Diagnosis not present

## 2023-04-14 DIAGNOSIS — Z1159 Encounter for screening for other viral diseases: Secondary | ICD-10-CM | POA: Diagnosis not present

## 2023-04-14 DIAGNOSIS — I10 Essential (primary) hypertension: Secondary | ICD-10-CM | POA: Diagnosis not present

## 2023-04-14 DIAGNOSIS — E1169 Type 2 diabetes mellitus with other specified complication: Secondary | ICD-10-CM | POA: Diagnosis not present

## 2023-04-14 DIAGNOSIS — Z794 Long term (current) use of insulin: Secondary | ICD-10-CM | POA: Diagnosis not present

## 2023-04-14 DIAGNOSIS — Z114 Encounter for screening for human immunodeficiency virus [HIV]: Secondary | ICD-10-CM | POA: Diagnosis not present

## 2023-04-14 DIAGNOSIS — E785 Hyperlipidemia, unspecified: Secondary | ICD-10-CM | POA: Diagnosis not present

## 2023-04-14 NOTE — Telephone Encounter (Signed)
Patient LVM on nurse line shouting in regards to muscle relaxer's.   He reports he was told muscle relaxer's were going to be sent in after office visit on 5/6. I do not see any notes regarding this.   Will forward to PCP.   [facility]

## 2023-04-15 ENCOUNTER — Encounter: Payer: Self-pay | Admitting: Family Medicine

## 2023-04-15 LAB — COMPREHENSIVE METABOLIC PANEL
ALT: 30 IU/L (ref 0–44)
AST: 27 IU/L (ref 0–40)
Albumin/Globulin Ratio: 2.1 (ref 1.2–2.2)
Albumin: 4.2 g/dL (ref 3.9–4.9)
Alkaline Phosphatase: 81 IU/L (ref 44–121)
BUN/Creatinine Ratio: 15 (ref 10–24)
BUN: 17 mg/dL (ref 8–27)
Bilirubin Total: <0.2 mg/dL (ref 0.0–1.2)
CO2: 21 mmol/L (ref 20–29)
Calcium: 9.3 mg/dL (ref 8.6–10.2)
Chloride: 108 mmol/L — ABNORMAL HIGH (ref 96–106)
Creatinine, Ser: 1.16 mg/dL (ref 0.76–1.27)
Globulin, Total: 2 g/dL (ref 1.5–4.5)
Glucose: 205 mg/dL — ABNORMAL HIGH (ref 70–99)
Potassium: 4.2 mmol/L (ref 3.5–5.2)
Sodium: 143 mmol/L (ref 134–144)
Total Protein: 6.2 g/dL (ref 6.0–8.5)
eGFR: 72 mL/min/{1.73_m2} (ref >59)

## 2023-04-15 LAB — HIV ANTIBODY (ROUTINE TESTING W REFLEX): HIV Screen 4th Generation wRfx: NONREACTIVE

## 2023-04-15 LAB — LIPID PANEL
Chol/HDL Ratio: 4.6 ratio (ref 0.0–5.0)
Cholesterol, Total: 157 mg/dL (ref 100–199)
HDL: 34 mg/dL — ABNORMAL LOW (ref >39)
LDL Chol Calc (NIH): 79 mg/dL (ref 0–99)
Triglycerides: 270 mg/dL — ABNORMAL HIGH (ref 0–149)
VLDL Cholesterol Cal: 44 mg/dL — ABNORMAL HIGH (ref 5–40)

## 2023-04-15 LAB — MICROALBUMIN / CREATININE URINE RATIO
Creatinine, Urine: 170.2 mg/dL
Microalb/Creat Ratio: 6 mg/g creat (ref 0–29)
Microalbumin, Urine: 10 ug/mL

## 2023-04-15 LAB — HEPATITIS C ANTIBODY: Hep C Virus Ab: NONREACTIVE

## 2023-04-15 MED ORDER — LIDOCAINE 4 % EX PTCH
1.0000 | MEDICATED_PATCH | CUTANEOUS | 2 refills | Status: AC
Start: 2023-04-15 — End: ?

## 2023-04-15 NOTE — Addendum Note (Signed)
Addended by: Lockie Mola on: 04/15/2023 02:04 PM   Modules accepted: Orders

## 2023-04-15 NOTE — Telephone Encounter (Signed)
Pt walks in today upset that he has not heard anything.  PCP text paged.  Jone Baseman, CMA

## 2023-04-19 NOTE — Telephone Encounter (Signed)
Patient walks in again as he has not heard anything.    Advised that provider tried to call but did not get and answer and also sent a mychart message but it looks as if he did not read it yet.  Pt starts to raise his voice with the following complaints, after I read the mychart message to him:  - Reports that Dr. Marsh Dolly "told me she was gonna send the flexeril because it is the only muscle relaxer that I am not allergic to" - His pain doctor said that it would be ok for him to take muscle relaxer's with his pain meds. - He was supposed to be assigned to Dr. Pollie Meyer - He was not going to "sit here and argue with you"   As I was trying to explain to him that our Faculty providers do not take new patients and that since this was a provider issue that I would need to have Dr. Lum Babe give him a call, he storms out of the office saying " Don't worry about it I will find a new doctor"  I will remove from Panel. Jone Baseman, CMA

## 2023-04-21 DIAGNOSIS — M25511 Pain in right shoulder: Secondary | ICD-10-CM | POA: Diagnosis not present

## 2023-04-30 DIAGNOSIS — Z7984 Long term (current) use of oral hypoglycemic drugs: Secondary | ICD-10-CM | POA: Diagnosis not present

## 2023-04-30 DIAGNOSIS — E119 Type 2 diabetes mellitus without complications: Secondary | ICD-10-CM | POA: Diagnosis not present

## 2023-04-30 DIAGNOSIS — Z794 Long term (current) use of insulin: Secondary | ICD-10-CM | POA: Diagnosis not present

## 2023-05-05 ENCOUNTER — Telehealth: Payer: Self-pay | Admitting: *Deleted

## 2023-05-05 DIAGNOSIS — M25511 Pain in right shoulder: Secondary | ICD-10-CM | POA: Diagnosis not present

## 2023-05-05 NOTE — Telephone Encounter (Signed)
Following up on his recent conversation with his PCP and Jone Baseman. The patient mentioned previously that he does not want to return to our clinic. I checked in with Dr. Marsh Dolly, and she felt that their patient-physician relationship is unhealthy at this point and wishes not to take the patient back.  I called to discuss this with the patient. I introduced Shanda Bumps and myself to him, and he confirmed his name and date of birth.  The patient discussed his frustration with the medication refill request, and hence, he wishes to seek care at a different facility. I advised him that his PCP would be able to provide urgent care for him for the next 30 days to allow him to reestablish care with a new provider. In the meantime, Dr. Marsh Dolly is willing and agreed to complete his FL2 form submitted today.  Mr. Cashner asked that his form be completed by next Monday at the latest.  He has an appointment already scheduled with Dr. Marsh Dolly then. He is aware that he will no longer be a patient at [facility], and we will be available to provide urgent care for the next 30 days. He was appreciative of the call.

## 2023-05-05 NOTE — Telephone Encounter (Signed)
Pt reported before leaving abruptly at last interaction that he would no longer be our patient ( see phone note from 04/13/23). At that time I removed from our panel.  He returns today, because he needs [place] to complete an FL2 form.  Advised that since he told us that he was not going to be a patient here anymore, I would need to check with Dr. Marsh Dolly and Medical Director.  Dr. Lum Babe and Dr. Marsh Dolly will give him a call.  In the meantime I have placed the form in PCP box.  Jone Baseman, CMA

## 2023-05-06 ENCOUNTER — Encounter: Payer: Self-pay | Admitting: Family Medicine

## 2023-05-06 DIAGNOSIS — E119 Type 2 diabetes mellitus without complications: Secondary | ICD-10-CM | POA: Diagnosis not present

## 2023-05-06 NOTE — Telephone Encounter (Signed)
A copy of certified dismissal letter printed and handed over to [facility] for processing.

## 2023-05-07 ENCOUNTER — Other Ambulatory Visit (HOSPITAL_BASED_OUTPATIENT_CLINIC_OR_DEPARTMENT_OTHER): Payer: Self-pay

## 2023-05-10 ENCOUNTER — Ambulatory Visit (INDEPENDENT_AMBULATORY_CARE_PROVIDER_SITE_OTHER): Payer: Medicare HMO | Admitting: Family Medicine

## 2023-05-10 ENCOUNTER — Other Ambulatory Visit: Payer: Self-pay

## 2023-05-10 ENCOUNTER — Other Ambulatory Visit (HOSPITAL_BASED_OUTPATIENT_CLINIC_OR_DEPARTMENT_OTHER): Payer: Self-pay

## 2023-05-10 ENCOUNTER — Encounter: Payer: Self-pay | Admitting: Family Medicine

## 2023-05-10 ENCOUNTER — Other Ambulatory Visit (HOSPITAL_COMMUNITY): Payer: Self-pay

## 2023-05-10 VITALS — BP 109/80 | HR 85 | Wt 185.4 lb

## 2023-05-10 DIAGNOSIS — J449 Chronic obstructive pulmonary disease, unspecified: Secondary | ICD-10-CM | POA: Diagnosis not present

## 2023-05-10 DIAGNOSIS — I639 Cerebral infarction, unspecified: Secondary | ICD-10-CM | POA: Diagnosis not present

## 2023-05-10 MED ORDER — OXYCODONE-ACETAMINOPHEN 10-325 MG PO TABS
1.0000 | ORAL_TABLET | Freq: Four times a day (QID) | ORAL | 0 refills | Status: DC | PRN
  Filled 2023-05-10: qty 120, 30d supply, fill #0

## 2023-05-10 NOTE — Progress Notes (Unsigned)
    SUBJECTIVE:   CHIEF COMPLAINT / HPI:   FL2 Patient is in clinic as he requests FL2 to be completed for assistance at home. Patient has cardiomyopathy and history of MCA PCA CVA in 2021. He has difficulty walking without getting short of breath. He also has some back pain and neck pain which he has tried physical therapy for multiple times and is on multiple pain medications.   PERTINENT  PMH / PSH: HTN, T2DM, Cardiomyopthy, H/o CVA  OBJECTIVE:   BP 109/80   Pulse 85   Wt 185 lb 6.4 oz (84.1 kg)   SpO2 98%   BMI 29.04 kg/m   General: well appearing, in no acute distress CV: RRR, radial pulses equal and palpable Resp: Normal work of breathing on room air Abd: Soft, non tender, non distended  Neuro: Alert & Oriented x 4    ASSESSMENT/PLAN:   FL2 was filled out with patient's current functional abilities and problems. FL2 was reviewed with patient.   Patient is recently dismissed from clinic after disagreement with provider Lockie Mola) and patient regarding muscle relaxant prescription. Patient was rude and disruptive to clinic staff. He continues to want a different provider as mentioned at this appointment. Did not have any other requests for this appointment.      Lockie Mola, MD [facility]

## 2023-05-11 ENCOUNTER — Telehealth: Payer: Self-pay

## 2023-05-11 DIAGNOSIS — M19011 Primary osteoarthritis, right shoulder: Secondary | ICD-10-CM | POA: Diagnosis not present

## 2023-05-11 NOTE — Telephone Encounter (Signed)
   Pre-operative Risk Assessment    Patient Name: Nicholas [name]  DOB: [birth year] MRN: [id number]      Request for Surgical Clearance    Procedure:   RIGHT SHOULD SCOPE DCR, SAD, CAPSULAR RELEASE  Date of Surgery:  Clearance TBD                                 Surgeon:  DR. Duwayne Heck Surgeon's Group or Practice Name:  [facility] Phone number:  [phone] Fax number:  [phone] ATTN: KERRI MAZE   Type of Clearance Requested:   - Medical  - Pharmacy:  Hold Clopidogrel (Plavix) INSTRUCTIONS WHEN TO HOLD   Type of Anesthesia:  Not Indicated   Additional requests/questions:    SignedMichaelle Copas   05/11/2023, 2:59 PM

## 2023-05-11 NOTE — Telephone Encounter (Signed)
Pre-op team,   This patient is not see by any provider in our office. It appear he was last seen by Dr. Rosemary Holms at [facility]. Please let requesting office know they should seek recommendations from that office.   Thank you!  DW

## 2023-05-11 NOTE — Telephone Encounter (Signed)
I have faxed over cardiac clearance updates to requesting provider's office.   

## 2023-05-14 ENCOUNTER — Other Ambulatory Visit: Payer: Self-pay | Admitting: Family Medicine

## 2023-05-14 ENCOUNTER — Ambulatory Visit (INDEPENDENT_AMBULATORY_CARE_PROVIDER_SITE_OTHER): Payer: Medicare HMO

## 2023-05-14 VITALS — Ht 67.0 in | Wt 185.0 lb

## 2023-05-14 DIAGNOSIS — Z0000 Encounter for general adult medical examination without abnormal findings: Secondary | ICD-10-CM

## 2023-05-14 DIAGNOSIS — E1169 Type 2 diabetes mellitus with other specified complication: Secondary | ICD-10-CM

## 2023-05-14 NOTE — Patient Instructions (Addendum)
Nicholas [name] , Thank you for taking time to come for your Medicare Wellness Visit. I appreciate your ongoing commitment to your health goals. Please review the following plan we discussed and let me know if I can assist you in the future.   These are the goals we discussed:  Goals      Increase physical activity     Remain active and independent        This is a list of the screening recommended for you and due dates:  Health Maintenance  Topic Date Due   COVID-19 Vaccine (1) Never done   Complete foot exam   Never done   Eye exam for diabetics  Never done   Zoster (Shingles) Vaccine (1 of 2) Never done   Hemoglobin A1C  07/01/2023   Flu Shot  07/08/2023   Yearly kidney function blood test for diabetes  04/13/2024   Yearly kidney health urinalysis for diabetes  04/13/2024   Medicare Annual Wellness Visit  05/13/2024   DTaP/Tdap/Td vaccine (2 - Td or Tdap) 01/04/2027   Colon Cancer Screening  11/02/2030   Hepatitis C Screening  Completed   HIV Screening  Completed   HPV Vaccine  Aged Out    Advanced directives: Information on Advanced Care Planning can be found at [facility] Advance Health Care Directives Advance Health Care Directives ([email or web address]  Please bring a copy of your health care power of attorney and living will to the office to be added to your chart at your convenience.   Conditions/risks identified: Aim for 30 minutes of exercise or brisk walking, 6-8 glasses of water, and 5 servings of fruits and vegetables each day.   Next appointment: Follow up in one year for your annual wellness visit   Preventive Care 40-64 Years, Male Preventive care refers to lifestyle choices and visits with your health care provider that can promote health and wellness. What does preventive care include? A yearly physical exam. This is also called an annual well check. Dental exams once or twice a year. Routine eye exams. Ask your health care provider how often you  should have your eyes checked. Personal lifestyle choices, including: Daily care of your teeth and gums. Regular physical activity. Eating a healthy diet. Avoiding tobacco and drug use. Limiting alcohol use. Practicing safe sex. Taking low-dose aspirin every day starting at age 43. What happens during an annual well check? The services and screenings done by your health care provider during your annual well check will depend on your age, overall health, lifestyle risk factors, and family history of disease. Counseling  Your health care provider may ask you questions about your: Alcohol use. Tobacco use. Drug use. Emotional well-being. Home and relationship well-being. Sexual activity. Eating habits. Work and work Astronomer. Screening  You may have the following tests or measurements: Height, weight, and BMI. Blood pressure. Lipid and cholesterol levels. These may be checked every 5 years, or more frequently if you are over 49 years old. Skin check. Lung cancer screening. You may have this screening every year starting at age 64 if you have a 30-pack-year history of smoking and currently smoke or have quit within the past 15 years. Fecal occult blood test (FOBT) of the stool. You may have this test every year starting at age 40. Flexible sigmoidoscopy or colonoscopy. You may have a sigmoidoscopy every 5 years or a colonoscopy every 10 years starting at age 92. Prostate cancer screening. Recommendations will vary depending on your  family history and other risks. Hepatitis C blood test. Hepatitis B blood test. Sexually transmitted disease (STD) testing. Diabetes screening. This is done by checking your blood sugar (glucose) after you have not eaten for a while (fasting). You may have this done every 1-3 years. Discuss your test results, treatment options, and if necessary, the need for more tests with your health care provider. Vaccines  Your health care provider may recommend  certain vaccines, such as: Influenza vaccine. This is recommended every year. Tetanus, diphtheria, and acellular pertussis (Tdap, Td) vaccine. You may need a Td booster every 10 years. Zoster vaccine. You may need this after age 92. Pneumococcal 13-valent conjugate (PCV13) vaccine. You may need this if you have certain conditions and have not been vaccinated. Pneumococcal polysaccharide (PPSV23) vaccine. You may need one or two doses if you smoke cigarettes or if you have certain conditions. Talk to your health care provider about which screenings and vaccines you need and how often you need them. This information is not intended to replace advice given to you by your health care provider. Make sure you discuss any questions you have with your health care provider. Document Released: 12/20/2015 Document Revised: 08/12/2016 Document Reviewed: 09/24/2015 Elsevier Interactive Patient Education  2017 [employer].  Fall Prevention in the Home Falls can cause injuries. They can happen to people of all ages. There are many things you can do to make your home safe and to help prevent falls. What can I do on the outside of my home? Regularly fix the edges of walkways and driveways and fix any cracks. Remove anything that might make you trip as you walk through a door, such as a raised step or threshold. Trim any bushes or trees on the path to your home. Use bright outdoor lighting. Clear any walking paths of anything that might make someone trip, such as rocks or tools. Regularly check to see if handrails are loose or broken. Make sure that both sides of any steps have handrails. Any raised decks and porches should have guardrails on the edges. Have any leaves, snow, or ice cleared regularly. Use sand or salt on walking paths during winter. Clean up any spills in your garage right away. This includes oil or grease spills. What can I do in the bathroom? Use night lights. Install grab bars by the  toilet and in the tub and shower. Do not use towel bars as grab bars. Use non-skid mats or decals in the tub or shower. If you need to sit down in the shower, use a plastic, non-slip stool. Keep the floor dry. Clean up any water that spills on the floor as soon as it happens. Remove soap buildup in the tub or shower regularly. Attach bath mats securely with double-sided non-slip rug tape. Do not have throw rugs and other things on the floor that can make you trip. What can I do in the bedroom? Use night lights. Make sure that you have a light by your bed that is easy to reach. Do not use any sheets or blankets that are too big for your bed. They should not hang down onto the floor. Have a firm chair that has side arms. You can use this for support while you get dressed. Do not have throw rugs and other things on the floor that can make you trip. What can I do in the kitchen? Clean up any spills right away. Avoid walking on wet floors. Keep items that you use a lot  in easy-to-reach places. If you need to reach something above you, use a strong step stool that has a grab bar. Keep electrical cords out of the way. Do not use floor polish or wax that makes floors slippery. If you must use wax, use non-skid floor wax. Do not have throw rugs and other things on the floor that can make you trip. What can I do with my stairs? Do not leave any items on the stairs. Make sure that there are handrails on both sides of the stairs and use them. Fix handrails that are broken or loose. Make sure that handrails are as long as the stairways. Check any carpeting to make sure that it is firmly attached to the stairs. Fix any carpet that is loose or worn. Avoid having throw rugs at the top or bottom of the stairs. If you do have throw rugs, attach them to the floor with carpet tape. Make sure that you have a light switch at the top of the stairs and the bottom of the stairs. If you do not have them, ask someone  to add them for you. What else can I do to help prevent falls? Wear shoes that: Do not have high heels. Have rubber bottoms. Are comfortable and fit you well. Are closed at the toe. Do not wear sandals. If you use a stepladder: Make sure that it is fully opened. Do not climb a closed stepladder. Make sure that both sides of the stepladder are locked into place. Ask someone to hold it for you, if possible. Clearly mark and make sure that you can see: Any grab bars or handrails. First and last steps. Where the edge of each step is. Use tools that help you move around (mobility aids) if they are needed. These include: Canes. Walkers. Scooters. Crutches. Turn on the lights when you go into a dark area. Replace any light bulbs as soon as they burn out. Set up your furniture so you have a clear path. Avoid moving your furniture around. If any of your floors are uneven, fix them. If there are any pets around you, be aware of where they are. Review your medicines with your doctor. Some medicines can make you feel dizzy. This can increase your chance of falling. Ask your doctor what other things that you can do to help prevent falls. This information is not intended to replace advice given to you by your health care provider. Make sure you discuss any questions you have with your health care provider. Document Released: 09/19/2009 Document Revised: 04/30/2016 Document Reviewed: 12/28/2014 Elsevier Interactive Patient Education  2017 [employer].

## 2023-05-14 NOTE — Progress Notes (Addendum)
Subjective:   Nicholas [name] is a 62 y.o. male who presents for an Initial Medicare Annual Wellness Visit.  I connected with  Nicholas [name] on 05/14/23 by a audio enabled telemedicine application and verified that I am speaking with the correct person using two identifiers.  Patient Location: Home  Provider Location: Home Office  I discussed the limitations of evaluation and management by telemedicine. The patient expressed understanding and agreed to proceed.  Review of Systems     Cardiac Risk Factors include: advanced age (>66men, >24 women);diabetes mellitus;dyslipidemia;male gender;hypertension;sedentary lifestyle     Objective:    Today's Vitals   05/14/23 1009  Weight: 185 lb (83.9 kg)  Height: 5\' 7"  (1.702 m)   Body mass index is 28.98 kg/m.     05/14/2023   10:14 AM 05/10/2023    9:35 AM 04/12/2023    4:21 PM 01/19/2023    3:33 PM 11/22/2022   12:49 AM 11/21/2022    6:53 AM 11/16/2022    9:18 AM  Advanced Directives  Does Patient Have a Medical Advance Directive? No No No Yes No No No  Does patient want to make changes to medical advance directive?    Yes (MAU/Ambulatory/Procedural Areas - Information given)     Would patient like information on creating a medical advance directive? Yes (MAU/Ambulatory/Procedural Areas - Information given) No - Patient declined No - Patient declined   No - Patient declined     Current Medications (verified) Outpatient Encounter Medications as of 05/14/2023  Medication Sig   acetaminophen (TYLENOL) 325 MG tablet Take 2 tablets (650 mg total) by mouth every 6 (six) hours as needed for mild pain (or Fever >/= 101).   albuterol (PROVENTIL HFA;VENTOLIN HFA) 108 (90 Base) MCG/ACT inhaler Inhale 1-2 puffs into the lungs every 6 (six) hours as needed for wheezing or shortness of breath.   atorvastatin (LIPITOR) 80 MG tablet Take 1 tablet (80 mg total) by mouth at bedtime.   budesonide-formoterol (SYMBICORT) 80-4.5 MCG/ACT inhaler Inhale 2  puffs into the lungs daily.   clopidogrel (PLAVIX) 75 MG tablet TAKE 1 TABLET(75 MG) BY MOUTH DAILY   diclofenac Sodium (VOLTAREN ARTHRITIS PAIN) 1 % GEL Apply 2 g topically 4 (four) times daily.   Eszopiclone 3 MG TABS Take 1 tablet (3 mg total) by mouth at bedtime.   fenofibrate (TRICOR) 145 MG tablet TAKE 1 TABLET(145 MG) BY MOUTH DAILY   gabapentin (NEURONTIN) 600 MG tablet Take 1 tablet (600 mg total) by mouth daily at 6 (six) AM. (Patient taking differently: Take 600 mg by mouth 2 (two) times daily.)   HUMALOG KWIKPEN 100 UNIT/ML KwikPen Inject 0-8 Units into the skin 4 (four) times daily as needed (high blood sugar). Inject into the skin up to 4 times a day, per sliding scale: BGL 160 or greater = 10 units; 200 or greater = 14 units   lactulose (CHRONULAC) 10 GM/15ML solution Take 45 mLs (30 g total) by mouth daily as needed for moderate constipation.   latanoprost (XALATAN) 0.005 % ophthalmic solution Place 1 drop into both eyes at bedtime.    lidocaine (HM LIDOCAINE PATCH) 4 % Place 1 patch onto the skin daily.   lurasidone (LATUDA) 40 MG TABS tablet Take 1 tablet by mouth at bedtime.   metFORMIN (GLUCOPHAGE) 1000 MG tablet Take 1,000 mg by mouth 2 (two) times daily.   methocarbamol (ROBAXIN-750) 750 MG tablet Take 1 tablet (750 mg total) by mouth every 8 (eight) hours as needed  for muscle spasms.   nortriptyline (PAMELOR) 50 MG capsule Take 100 mg by mouth at bedtime.    oxyCODONE-acetaminophen (PERCOCET) 10-325 MG tablet Take 1 tablet by mouth 4 (four) times daily as directed.   oxyCODONE-acetaminophen (PERCOCET) 10-325 MG tablet Take 1 tablet by mouth 4 (four) times daily as needed.   SUMAtriptan (IMITREX) 100 MG tablet Take 1 tablet (100 mg total) by mouth daily as needed for migraine or headache. May repeat in 2 hours if headache persists or recurs.   TOUJEO MAX SOLOSTAR 300 UNIT/ML Solostar Pen Inject 33 Units into the skin at bedtime. (Patient taking differently: Inject 25 Units into  the skin at bedtime.)   traZODone (DESYREL) 100 MG tablet Take 100 mg by mouth at bedtime.   verapamil (VERELAN) 100 MG 24 hr capsule TAKE 1 CAPSULE(100 MG) BY MOUTH AT BEDTIME   zonisamide (ZONEGRAN) 100 MG capsule Take 200 mg by mouth 2 (two) times daily.   No facility-administered encounter medications on file as of 05/14/2023.    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74---  LAST SEIZURE 1 WK AGO (APPROX ,  10-31-2013)   NO NEUROLOGIST---  PT SEES PCP  DR Lindajo Royal   Feeling of incomplete bladder emptying    Frequency of urination    Gastric ulcer    GERD (gastroesophageal reflux disease)    Hypertension    Hyperthyroidism    NO MEDS    Migraine    Seizures (HCC)    Stroke (HCC)    TIA (transient ischemic attack)    Type 2 diabetes mellitus (HCC)    Past Surgical History:  Procedure Laterality Date   ABDOMINAL SURGERY     ANTERIOR CERVICAL DECOMP/DISCECTOMY FUSION  2007   C4  --  C6   CERVICAL FUSION     CHOLECYSTECTOMY     COLONOSCOPY WITH PROPOFOL Left 11/02/2020   Procedure: COLONOSCOPY WITH PROPOFOL;  Surgeon: Willis Modena, MD;  Location: [facility] ENDOSCOPY;  Service: Endoscopy;  Laterality: Left;   CYSTOSCOPY N/A 11/09/2013   Procedure: CYSTOSCOPY FLEXIBLE;  Surgeon: Bjorn Pippin, MD;  Location[facility];  Service: Urology;  Laterality: N/A;   EPIDIDYMECTOMY Left 11/09/2013   Procedure:  LEFT EPIDIDYMECTOMY;   Surgeon: Bjorn Pippin, MD;  Location: [facility];  Service: Urology;  Laterality: Left;  POSSIBLE OUTPATIENT WITH OBSERVATION   EXCISION LIPOMA LEFT SHOULDER  2004 (APPROX)   MULTIPLE CYST REMOVED FROM CHEST  AGE 31   OTHER SURGICAL HISTORY     hemorroid surgery    TESTICLE REMOVAL Left    TONSILLECTOMY     Family History  Problem Relation Age of Onset   Heart failure Mother    Diabetes Mother    Hypertension Mother    Cirrhosis Father    Heart failure Father    Kidney disease Father    Heart failure Brother    Heart disease Brother    Heart disease Brother    Social History   Socioeconomic History   Marital status: Widowed    Spouse name:  Not on file   Number of children: 5   Years of education: Not on file   Highest education level: Not on file  Occupational History   Not on file  Tobacco Use   Smoking status: Former    Packs/day: 0.25    Years: 15.00    Additional pack years: 0.00    Total pack years: 3.75    Types: Cigarettes    Quit date: 11/08/1992    Years since quitting: 30.5   Smokeless tobacco: Never  Vaping Use   Vaping Use: Never used  Substance and Sexual Activity   Alcohol use: No   Drug use: No   Sexual activity: Not Currently  Other Topics Concern   Not on file  Social History Narrative   ** Merged History Encounter **       Social Determinants of Health   Financial Resource Strain: Low Risk  (05/14/2023)   Overall Financial Resource Strain (CARDIA)    Difficulty of Paying Living Expenses: Not hard at all  Food Insecurity: No Food Insecurity (05/14/2023)   Hunger Vital Sign    Worried About Running Out of Food in the Last Year: Never true    Ran Out of Food in the Last Year: Never true  Transportation Needs: No Transportation Needs (05/14/2023)   PRAPARE - Administrator, Civil Service (Medical): No    Lack of Transportation (Non-Medical): No  Physical Activity: Inactive (05/14/2023)   Exercise Vital Sign    Days of  Exercise per Week: 0 days    Minutes of Exercise per Session: 0 min  Stress: No Stress Concern Present (05/14/2023)   [employer] of Occupational Health - Occupational Stress Questionnaire    Feeling of Stress : Not at all  Social Connections: Moderately Isolated (05/14/2023)   Social Connection and Isolation Panel [NHANES]    Frequency of Communication with Friends and Family: More than three times a week    Frequency of Social Gatherings with Friends and Family: Three times a week    Attends Religious Services: More than 4 times per year    Active Member of Clubs or Organizations: No    Attends Banker Meetings: Never    Marital Status: Widowed    Tobacco Counseling Counseling given: Not Answered   Clinical Intake:  Pre-visit preparation completed: Yes  Pain : No/denies pain  Diabetes: Yes CBG done?: No Did pt. bring in CBG monitor from home?: No  How often do you need to have someone help you when you read instructions, pamphlets, or other written materials from your doctor or pharmacy?: 1 - Never  Diabetic?No   Nutrition Risk Assessment:  Has the patient had any N/V/D within the last 2 months?  No  Does the patient have any non-healing wounds?  No  Has the patient had any unintentional weight loss or weight gain?  No   Diabetes:  Is the patient diabetic?  Yes  If diabetic, was a CBG obtained today?  No  Did the patient bring in their glucometer from home?  No  How often do you monitor your CBG's? Libre.   Financial Strains and Diabetes Management:  Are you having any financial strains with the device, your supplies or your medication? No .  Does the patient want to be seen by Chronic Care Management for management of their diabetes?  No  Would the patient like to be referred to a Nutritionist or for Diabetic Management?  No   Diabetic  Exams:  Diabetic Eye Exam: Overdue for diabetic eye exam. Pt has been advised about the importance in  completing this exam. Patient advised to call and schedule an eye exam. Diabetic Foot Exam: Overdue, Pt has been advised about the importance in completing this exam. Pt is scheduled for diabetic foot exam on at next office visit.   Interpreter Needed?: No  Information entered by :: Kandis Fantasia LPN   Activities of Daily Living    05/14/2023   10:14 AM  In your present state of health, do you have any difficulty performing the following activities:  Hearing? 0  Vision? 0  Difficulty concentrating or making decisions? 0  Walking or climbing stairs? 0  Dressing or bathing? 0  Doing errands, shopping? 0  Preparing Food and eating ? N  Using the Toilet? N  In the past six months, have you accidently leaked urine? N  Do you have problems with loss of bowel control? N  Managing your Medications? N  Managing your Finances? N  Housekeeping or managing your Housekeeping? N    Patient Care Team: Lockie Mola, MD as PCP - General (Family Medicine) Patient, No Pcp Per (General Practice) Yolonda Kida, MD as Consulting Physician (Orthopedic Surgery) Doristine Bosworth., MD (Endocrinology)  Indicate any recent Medical Services you may have received from other than [facility] providers in the past year (date may be approximate).     Assessment:   This is a routine wellness examination for [place].  Hearing/Vision screen Hearing Screening - Comments:: Denies hearing difficulties   Vision Screening - Comments:: No vision problems; will schedule routine eye exam soon    Dietary issues and exercise activities discussed: Current Exercise Habits: The patient does not participate in regular exercise at present   Goals Addressed             This Visit's Progress    Increase physical activity       Remain active and independent         Depression Screen    05/14/2023   10:11 AM 05/10/2023    9:35 AM 04/12/2023    4:22 PM  PHQ 2/9 Scores  PHQ - 2 Score 4 4 2   PHQ- 9 Score  14 14 9     Fall Risk    05/14/2023   10:10 AM 05/10/2023    9:35 AM 04/12/2023    4:22 PM  Fall Risk   Falls in the past year? 0 0 0  Number falls in past yr: 0 0   Injury with Fall? 0 0   Risk for fall due to : No Fall Risks    Follow up Falls prevention discussed;Education provided;Falls evaluation completed      FALL RISK PREVENTION PERTAINING TO THE HOME:  Any stairs in or around the home? No  If so, are there any without handrails? No  Home free of loose throw rugs in walkways, pet beds, electrical cords, etc? Yes  Adequate lighting in your home to reduce risk of falls? Yes   ASSISTIVE DEVICES UTILIZED TO PREVENT FALLS:  Life alert? No  Use of a cane, walker or w/c? No  Grab bars in the bathroom? Yes  Shower chair or bench in shower? No  Elevated toilet seat or a handicapped toilet? Yes   TIMED UP AND GO:  Was the test performed? No . Telephonic visit   Cognitive Function:        05/14/2023   10:15 AM  6CIT Screen  What Year? 0 points  What month? 0 points  What time? 0 points  Count back from 20 0 points  Months in reverse 0 points  Repeat phrase 0 points  Total Score 0 points    Immunizations Immunization History  Administered Date(s) Administered   Influenza,inj,Quad PF,6+ Mos 11/10/2013   Pneumococcal Polysaccharide-23 11/10/2013   Tdap 01/04/2017    TDAP status: Up to date  Pneumococcal vaccine status: Up to date  Covid-19 vaccine status: Information provided on how to obtain vaccines.   Qualifies for Shingles Vaccine? Yes   Zostavax completed No   Shingrix Completed?: No.    Education has been provided regarding the importance of this vaccine. Patient has been advised to call insurance company to determine out of pocket expense if they have not yet received this vaccine. Advised may also receive vaccine at local pharmacy or Health Dept. Verbalized acceptance and understanding.  Screening Tests Health Maintenance  Topic Date Due   COVID-19  Vaccine (1) Never done   FOOT EXAM  Never done   OPHTHALMOLOGY EXAM  Never done   Zoster Vaccines- Shingrix (1 of 2) Never done   HEMOGLOBIN A1C  07/01/2023   INFLUENZA VACCINE  07/08/2023   Diabetic kidney evaluation - eGFR measurement  04/13/2024   Diabetic kidney evaluation - Urine ACR  04/13/2024   Medicare Annual Wellness (AWV)  05/13/2024   DTaP/Tdap/Td (2 - Td or Tdap) 01/04/2027   Colonoscopy  11/02/2030   Hepatitis C Screening  Completed   HIV Screening  Completed   HPV VACCINES  Aged Out    Health Maintenance  Health Maintenance Due  Topic Date Due   COVID-19 Vaccine (1) Never done   FOOT EXAM  Never done   OPHTHALMOLOGY EXAM  Never done   Zoster Vaccines- Shingrix (1 of 2) Never done    Colorectal cancer screening: Type of screening: Colonoscopy. Completed 11/02/20. Repeat every 10 years  Lung Cancer Screening: (Low Dose CT Chest recommended if Age 61-80 years, 30 pack-year currently smoking OR have quit w/in 15years.) does not qualify.   Lung Cancer Screening Referral: n/a  Additional Screening:  Hepatitis C Screening: does qualify; Completed 04/14/23  Vision Screening: Recommended annual ophthalmology exams for early detection of glaucoma and other disorders of the eye. Is the patient up to date with their annual eye exam?  No  Who is the provider or what is the name of the office in which the patient attends annual eye exams? none If pt is not established with a provider, would they like to be referred to a provider to establish care? Yes .   Dental Screening: Recommended annual dental exams for proper oral hygiene  Community Resource Referral / Chronic Care Management: CRR required this visit?  No   CCM required this visit?  No      Plan:     I have personally reviewed and noted the following in the patient's chart:   Medical and social history Use of alcohol, tobacco or illicit drugs  Current medications and supplements including opioid  prescriptions. Patient is not currently taking opioid prescriptions. Functional ability and status Nutritional status Physical activity Advanced directives List of other physicians Hospitalizations, surgeries, and ER visits in previous 12 months Vitals Screenings to include cognitive, depression, and falls Referrals and appointments  In addition, I have reviewed and discussed with patient certain preventive protocols, quality metrics, and best practice recommendations. A written personalized care plan for preventive services as well as general preventive health  recommendations were provided to patient.     Durwin Nora, [place]   01/15/5283   Due to this being a virtual visit, the after visit summary with patients personalized plan was offered to patient via mail or my-chart. Patient declined at this time./ Patient would like to access on my-chart/ per request, patient was mailed a copy of AVS./ Patient preferred to pick up at office at next visit  Nurse Notes: Patient is having problems with fluctuation with blood sugar; advised to contact endocrinologist.  Also has a pending dental surgery.

## 2023-05-19 ENCOUNTER — Other Ambulatory Visit (HOSPITAL_BASED_OUTPATIENT_CLINIC_OR_DEPARTMENT_OTHER): Payer: Self-pay

## 2023-05-26 ENCOUNTER — Ambulatory Visit: Payer: Medicare HMO | Admitting: Cardiology

## 2023-05-26 ENCOUNTER — Encounter: Payer: Self-pay | Admitting: Cardiology

## 2023-05-26 VITALS — BP 131/91 | HR 72 | Ht 67.0 in | Wt 189.0 lb

## 2023-05-26 DIAGNOSIS — Z01810 Encounter for preprocedural cardiovascular examination: Secondary | ICD-10-CM | POA: Diagnosis not present

## 2023-05-26 DIAGNOSIS — R072 Precordial pain: Secondary | ICD-10-CM | POA: Diagnosis not present

## 2023-05-26 DIAGNOSIS — R002 Palpitations: Secondary | ICD-10-CM | POA: Diagnosis not present

## 2023-05-26 NOTE — Progress Notes (Signed)
Follow up visit  Subjective:   Nicholas [name], male    DOB: May 28, 1961, 62 y.o.   MRN: [id number]    HPI   Chief Complaint  Patient presents with   Medical Clearance    62 y.o. African-American male  with hypertension, type 2 diabetes mellitus, hypothyroidism,h/o gastric ulcer, bipolar disorder, conversion disorder, h/o seizures, h/o prior tobacco and cocaine abuse  Patient has episodes of sharp chest pain, both at rest and exertion. Episodes are short lasting. There is no clear worsening of chest pain with exertion.     Current Outpatient Medications:    acetaminophen (TYLENOL) 325 MG tablet, Take 2 tablets (650 mg total) by mouth every 6 (six) hours as needed for mild pain (or Fever >/= 101)., Disp: , Rfl:    albuterol (PROVENTIL HFA;VENTOLIN HFA) 108 (90 Base) MCG/ACT inhaler, Inhale 1-2 puffs into the lungs every 6 (six) hours as needed for wheezing or shortness of breath., Disp: 1 Inhaler, Rfl: 0   atorvastatin (LIPITOR) 80 MG tablet, Take 1 tablet (80 mg total) by mouth at bedtime., Disp: 30 tablet, Rfl: 3   budesonide-formoterol (SYMBICORT) 80-4.5 MCG/ACT inhaler, Inhale 2 puffs into the lungs daily., Disp: 1 each, Rfl: 2   clopidogrel (PLAVIX) 75 MG tablet, TAKE 1 TABLET(75 MG) BY MOUTH DAILY, Disp: 90 tablet, Rfl: 1   diclofenac Sodium (VOLTAREN ARTHRITIS PAIN) 1 % GEL, Apply 2 g topically 4 (four) times daily., Disp: 100 g, Rfl: 3   Eszopiclone 3 MG TABS, Take 1 tablet (3 mg total) by mouth at bedtime., Disp: 3 tablet, Rfl: 0   fenofibrate (TRICOR) 145 MG tablet, TAKE 1 TABLET(145 MG) BY MOUTH DAILY, Disp: 90 tablet, Rfl: 1   gabapentin (NEURONTIN) 600 MG tablet, Take 1 tablet (600 mg total) by mouth daily at 6 (six) AM. (Patient taking differently: Take 600 mg by mouth 2 (two) times daily.), Disp: 30 tablet, Rfl: 0   HUMALOG KWIKPEN 100 UNIT/ML KwikPen, Inject 0-8 Units into the skin 4 (four) times daily as needed (high blood sugar). Inject into the skin up to 4 times a  day, per sliding scale: BGL 160 or greater = 10 units; 200 or greater = 14 units, Disp: , Rfl:    lactulose (CHRONULAC) 10 GM/15ML solution, Take 45 mLs (30 g total) by mouth daily as needed for moderate constipation., Disp: 236 mL, Rfl: 1   latanoprost (XALATAN) 0.005 % ophthalmic solution, Place 1 drop into both eyes at bedtime. , Disp: , Rfl:    lidocaine (HM LIDOCAINE PATCH) 4 %, Place 1 patch onto the skin daily., Disp: 10 patch, Rfl: 2   lurasidone (LATUDA) 40 MG TABS tablet, Take 1 tablet by mouth at bedtime., Disp: , Rfl:    metFORMIN (GLUCOPHAGE) 1000 MG tablet, Take 1,000 mg by mouth 2 (two) times daily., Disp: , Rfl:    methocarbamol (ROBAXIN-750) 750 MG tablet, Take 1 tablet (750 mg total) by mouth every 8 (eight) hours as needed for muscle spasms., Disp: 30 tablet, Rfl: 0   nortriptyline (PAMELOR) 50 MG capsule, Take 100 mg by mouth at bedtime. , Disp: , Rfl:    oxyCODONE-acetaminophen (PERCOCET) 10-325 MG tablet, Take 1 tablet by mouth 4 (four) times daily as directed., Disp: 120 tablet, Rfl: 0   oxyCODONE-acetaminophen (PERCOCET) 10-325 MG tablet, Take 1 tablet by mouth 4 (four) times daily as needed., Disp: 120 tablet, Rfl: 0   SUMAtriptan (IMITREX) 100 MG tablet, Take 1 tablet (100 mg total) by mouth daily as needed  for migraine or headache. May repeat in 2 hours if headache persists or recurs., Disp: 20 tablet, Rfl: 0   TOUJEO MAX SOLOSTAR 300 UNIT/ML Solostar Pen, Inject 33 Units into the skin at bedtime. (Patient taking differently: Inject 25 Units into the skin at bedtime.), Disp: , Rfl:    traZODone (DESYREL) 100 MG tablet, Take 100 mg by mouth at bedtime., Disp: , Rfl:    verapamil (VERELAN) 100 MG 24 hr capsule, TAKE 1 CAPSULE(100 MG) BY MOUTH AT BEDTIME, Disp: 90 capsule, Rfl: 1   zonisamide (ZONEGRAN) 100 MG capsule, Take 200 mg by mouth 2 (two) times daily., Disp: , Rfl:   Cardiovascular & other pertient studies:  EKG 05/26/2023: Sinus rhythm 60 bpm Nonspecific  T-abnormality  Mobile cardiac telemetry 9 days 05/08/2022 - 05/17/2022: Dominant rhythm: Sinus. HR 44-130 bpm. Avg HR 91 bpm. 1 episode of atrial tachycardia, fastest at 171 bpm for 5 beats. <1% isolated SVE, couplet/triplets. 0 episodes of VT. <1% isolated VE. No couplet/triplets. No atrial fibrillation/atrial flutter/VT/high grade AV block, sinus pause >3sec noted. 0 patient triggered events.    CCTA 10/2020: 1. Coronary calcium score of 0. 2. Normal coronary origin with right dominance. 3. Main pulmonary artery is at the upper limit of normal, 29mm. 4. CAD-RADS = 0.   RECOMMENDATIONS: No evidence of CAD (0%). Consider non-atherosclerotic causes of chest pain.   Echocardiogram 08/2020:  1. Left ventricular ejection fraction, by estimation, is 45 to 50%. The  left ventricle has mildly decreased function. The left ventricle  demonstrates regional wall motion abnormalities (see scoring  diagram/findings for description). There is mild left  ventricular hypertrophy. Left ventricular diastolic parameters are  consistent with Grade I diastolic dysfunction (impaired relaxation). There  is moderate hypokinesis of the left ventricular, basal-mid inferolateral  wall and inferior wall suggesting  possible circumflex territory ischemia   2. Right ventricular systolic function is mildly reduced. The right  ventricular size is normal. There is normal pulmonary artery systolic  pressure. The estimated right ventricular systolic pressure is 23.2 mmHg.   3. The mitral valve is abnormal. Trivial mitral valve regurgitation.   4. The aortic valve is tricuspid. Aortic valve regurgitation is not  visualized.   5. The inferior vena cava is normal in size with greater than 50%  respiratory variability, suggesting right atrial pressure of 3 mmHg.   Lexiscan nuclear stress test 08/2020: 1. No reversible ischemia or infarction. 2. Normal left ventricular wall motion. 3. Left ventricular ejection  fraction 46% 4. Non invasive risk stratification*: Low  Recent labs: 04/14/2023: Glucose 205, BUN/Cr 17/1.16. EGFR 72. Na/K 143/4.2. Rest of the CMP normal Chol 157, TG 270, HDL 34, LDL 79  12/31/2022: Chol 176, TG 109, HDL 39, LDL 117 HbA1C 7.0%  04/26/2022: Glucose 158, BUN/Cr 11/1.38. EGFR 59. Na/K 139/3.9. AST/ALT 50/55. Rest of the CMP normal H/H 15/46. MCV 84. Platelets 194   09/13/2020: Glucose 179, BUN/Cr 21/1.39. EGFR >60. Na/K 140/4.4. Rest of the CMP normal HbA1C 7.1% Chol 79, TG 102, HDL 25, LDL 34 TSH 4.2 normal Trop HS 4,4    Review of Systems  Cardiovascular:  Positive for chest pain. Negative for dyspnea on exertion, leg swelling, palpitations and syncope.         Vitals:   05/26/23 0942  BP: (!) 131/91  Pulse: 72  SpO2: 97%     Body mass index is 29.6 kg/m. Filed Weights   05/26/23 0942  Weight: 189 lb (85.7 kg)     Objective:  Physical Exam Vitals and nursing note reviewed.  Constitutional:      General: He is not in acute distress. Neck:     Vascular: No JVD.  Cardiovascular:     Rate and Rhythm: Normal rate and regular rhythm.     Heart sounds: Normal heart sounds. No murmur heard. Pulmonary:     Effort: Pulmonary effort is normal.     Breath sounds: Normal breath sounds. No wheezing or rales.  Musculoskeletal:     Right lower leg: No edema.     Left lower leg: No edema.           Assessment & Recommendations:   62 y.o. African-American male  with hypertension, type 2 diabetes mellitus, hypothyroidism,h/o gastric ulcer, bipolar disorder, conversion disorder, h/o seizures, h/o prior tobacco and cocaine abuse  Chest pain: CCTA with no CAD< calcium score 0 (10/2020) Unlikely cardiac in origin. Will check exercise treadmill stress test and echocardiogram. If unremarkable, he is low risk for shoulder surgery (Rt shoulder scope DCR, SAD, capsular release). (Surgeon Dr. Duwayne Heck, Kerri Maze  Hypertension: Generally well  controlled  Hyperlipidemia: LDL better. Elevated TG likely related to diabetes.  F/u as needed    Elder Negus, MD Pager: [phone] Office: [phone]

## 2023-05-28 ENCOUNTER — Ambulatory Visit: Payer: Medicare HMO

## 2023-05-28 DIAGNOSIS — R072 Precordial pain: Secondary | ICD-10-CM | POA: Diagnosis not present

## 2023-06-01 ENCOUNTER — Ambulatory Visit: Payer: Medicare HMO

## 2023-06-01 DIAGNOSIS — G43009 Migraine without aura, not intractable, without status migrainosus: Secondary | ICD-10-CM | POA: Diagnosis not present

## 2023-06-01 DIAGNOSIS — R072 Precordial pain: Secondary | ICD-10-CM

## 2023-06-01 DIAGNOSIS — H4089 Other specified glaucoma: Secondary | ICD-10-CM | POA: Diagnosis not present

## 2023-06-01 DIAGNOSIS — J449 Chronic obstructive pulmonary disease, unspecified: Secondary | ICD-10-CM | POA: Diagnosis not present

## 2023-06-01 DIAGNOSIS — F3189 Other bipolar disorder: Secondary | ICD-10-CM | POA: Diagnosis not present

## 2023-06-01 DIAGNOSIS — F330 Major depressive disorder, recurrent, mild: Secondary | ICD-10-CM | POA: Diagnosis not present

## 2023-06-07 ENCOUNTER — Ambulatory Visit: Payer: Medicare HMO | Attending: Cardiovascular Disease | Admitting: Cardiovascular Disease

## 2023-06-07 ENCOUNTER — Encounter: Payer: Self-pay | Admitting: Cardiovascular Disease

## 2023-06-08 DIAGNOSIS — R6889 Other general symptoms and signs: Secondary | ICD-10-CM | POA: Diagnosis not present

## 2023-06-09 ENCOUNTER — Other Ambulatory Visit (HOSPITAL_COMMUNITY): Payer: Self-pay

## 2023-06-09 MED ORDER — OXYCODONE-ACETAMINOPHEN 10-325 MG PO TABS
1.0000 | ORAL_TABLET | Freq: Four times a day (QID) | ORAL | 0 refills | Status: DC
  Filled 2023-06-09: qty 120, 30d supply, fill #0

## 2023-06-14 DIAGNOSIS — R6889 Other general symptoms and signs: Secondary | ICD-10-CM | POA: Diagnosis not present

## 2023-06-14 DIAGNOSIS — G4733 Obstructive sleep apnea (adult) (pediatric): Secondary | ICD-10-CM | POA: Diagnosis not present

## 2023-06-19 ENCOUNTER — Other Ambulatory Visit: Payer: Self-pay | Admitting: Family Medicine

## 2023-06-19 DIAGNOSIS — I639 Cerebral infarction, unspecified: Secondary | ICD-10-CM

## 2023-06-24 DIAGNOSIS — Z114 Encounter for screening for human immunodeficiency virus [HIV]: Secondary | ICD-10-CM | POA: Diagnosis not present

## 2023-06-24 DIAGNOSIS — E785 Hyperlipidemia, unspecified: Secondary | ICD-10-CM | POA: Diagnosis not present

## 2023-06-24 DIAGNOSIS — N1831 Chronic kidney disease, stage 3a: Secondary | ICD-10-CM | POA: Diagnosis not present

## 2023-06-24 DIAGNOSIS — D367 Benign neoplasm of other specified sites: Secondary | ICD-10-CM | POA: Diagnosis not present

## 2023-06-24 DIAGNOSIS — G43009 Migraine without aura, not intractable, without status migrainosus: Secondary | ICD-10-CM | POA: Diagnosis not present

## 2023-06-24 DIAGNOSIS — Z1159 Encounter for screening for other viral diseases: Secondary | ICD-10-CM | POA: Diagnosis not present

## 2023-06-24 DIAGNOSIS — I10 Essential (primary) hypertension: Secondary | ICD-10-CM | POA: Diagnosis not present

## 2023-06-24 DIAGNOSIS — E1140 Type 2 diabetes mellitus with diabetic neuropathy, unspecified: Secondary | ICD-10-CM | POA: Diagnosis not present

## 2023-06-24 DIAGNOSIS — Z1211 Encounter for screening for malignant neoplasm of colon: Secondary | ICD-10-CM | POA: Diagnosis not present

## 2023-06-24 DIAGNOSIS — I639 Cerebral infarction, unspecified: Secondary | ICD-10-CM | POA: Diagnosis not present

## 2023-06-29 ENCOUNTER — Encounter: Payer: Self-pay | Admitting: Neurology

## 2023-06-29 DIAGNOSIS — F330 Major depressive disorder, recurrent, mild: Secondary | ICD-10-CM | POA: Diagnosis not present

## 2023-06-29 DIAGNOSIS — F419 Anxiety disorder, unspecified: Secondary | ICD-10-CM | POA: Diagnosis not present

## 2023-06-29 DIAGNOSIS — R6889 Other general symptoms and signs: Secondary | ICD-10-CM | POA: Diagnosis not present

## 2023-07-02 ENCOUNTER — Other Ambulatory Visit (HOSPITAL_COMMUNITY): Payer: Self-pay

## 2023-07-02 ENCOUNTER — Ambulatory Visit (HOSPITAL_COMMUNITY)
Admission: EM | Admit: 2023-07-02 | Discharge: 2023-07-02 | Disposition: A | Discharge status: Other Acute Inpatient Hospital | Payer: Medicare HMO

## 2023-07-02 MED ORDER — CYCLOBENZAPRINE HCL 5 MG PO TABS
5.0000 mg | ORAL_TABLET | Freq: Three times a day (TID) | ORAL | 1 refills | Status: DC | PRN
  Filled 2023-07-02: qty 30, 10d supply, fill #0
  Filled 2023-08-01: qty 30, 10d supply, fill #1

## 2023-07-06 DIAGNOSIS — S81802D Unspecified open wound, left lower leg, subsequent encounter: Secondary | ICD-10-CM | POA: Diagnosis not present

## 2023-07-09 ENCOUNTER — Other Ambulatory Visit (HOSPITAL_COMMUNITY): Payer: Self-pay

## 2023-07-09 MED ORDER — OXYCODONE-ACETAMINOPHEN 10-325 MG PO TABS
1.0000 | ORAL_TABLET | Freq: Four times a day (QID) | ORAL | 0 refills | Status: DC
Start: 2023-07-09 — End: 2023-08-25
  Filled 2023-07-09: qty 120, 30d supply, fill #0

## 2023-07-14 ENCOUNTER — Telehealth: Payer: Self-pay | Admitting: Gastroenterology

## 2023-07-14 NOTE — Telephone Encounter (Signed)
Hi Dr. Myrtie Neither,    Supervising Provider 07/14/2023 AM   Patient is wishing to transfer his care over to [facility] to have his next colonoscopy. Patient last had a colonoscopy in 2021 with Dr. Dulce Sellar. Patient since then has been seen with [facility]. Patient is wishing to transfer his care due to no longer being under the [facility] and wishing to transfer his care back with [facility]. His previous records are in [facility] for your to review and advise on scheduling.    Thank you.

## 2023-07-14 NOTE — Telephone Encounter (Signed)
Request received to transfer GI care from outside practice to [facility].  We appreciate the interest in our practice, however due to high demand from patients without established GI providers, we cannot accommodate this transfer.    - H. Danis

## 2023-07-20 DIAGNOSIS — R32 Unspecified urinary incontinence: Secondary | ICD-10-CM | POA: Diagnosis not present

## 2023-07-23 DIAGNOSIS — R6889 Other general symptoms and signs: Secondary | ICD-10-CM | POA: Diagnosis not present

## 2023-07-28 ENCOUNTER — Emergency Department (HOSPITAL_COMMUNITY)
Admission: EM | Admit: 2023-07-28 | Discharge: 2023-07-28 | Disposition: A | Discharge status: Home / Self Care | Payer: Medicare HMO | Attending: Emergency Medicine | Admitting: Emergency Medicine

## 2023-07-28 ENCOUNTER — Other Ambulatory Visit: Payer: Self-pay

## 2023-07-28 ENCOUNTER — Emergency Department (HOSPITAL_COMMUNITY): Payer: Medicare HMO

## 2023-07-28 ENCOUNTER — Encounter (HOSPITAL_COMMUNITY): Payer: Self-pay | Admitting: Emergency Medicine

## 2023-07-28 DIAGNOSIS — I10 Essential (primary) hypertension: Secondary | ICD-10-CM | POA: Diagnosis not present

## 2023-07-28 DIAGNOSIS — Z7984 Long term (current) use of oral hypoglycemic drugs: Secondary | ICD-10-CM | POA: Diagnosis not present

## 2023-07-28 DIAGNOSIS — E119 Type 2 diabetes mellitus without complications: Secondary | ICD-10-CM | POA: Insufficient documentation

## 2023-07-28 DIAGNOSIS — U071 COVID-19: Secondary | ICD-10-CM | POA: Insufficient documentation

## 2023-07-28 DIAGNOSIS — Z79899 Other long term (current) drug therapy: Secondary | ICD-10-CM | POA: Insufficient documentation

## 2023-07-28 DIAGNOSIS — R059 Cough, unspecified: Secondary | ICD-10-CM | POA: Diagnosis not present

## 2023-07-28 DIAGNOSIS — R0602 Shortness of breath: Secondary | ICD-10-CM | POA: Diagnosis not present

## 2023-07-28 DIAGNOSIS — Z7951 Long term (current) use of inhaled steroids: Secondary | ICD-10-CM | POA: Diagnosis not present

## 2023-07-28 DIAGNOSIS — J4521 Mild intermittent asthma with (acute) exacerbation: Secondary | ICD-10-CM | POA: Diagnosis not present

## 2023-07-28 DIAGNOSIS — Z794 Long term (current) use of insulin: Secondary | ICD-10-CM | POA: Insufficient documentation

## 2023-07-28 DIAGNOSIS — R531 Weakness: Secondary | ICD-10-CM | POA: Diagnosis not present

## 2023-07-28 LAB — URINALYSIS, W/ REFLEX TO CULTURE (INFECTION SUSPECTED)
Bacteria, UA: NONE SEEN
Bilirubin Urine: NEGATIVE
Glucose, UA: 50 mg/dL — AB
Hgb urine dipstick: NEGATIVE
Ketones, ur: NEGATIVE mg/dL
Leukocytes,Ua: NEGATIVE
Nitrite: NEGATIVE
Protein, ur: NEGATIVE mg/dL
Specific Gravity, Urine: 1.016 (ref 1.005–1.030)
pH: 6 (ref 5.0–8.0)

## 2023-07-28 LAB — COMPREHENSIVE METABOLIC PANEL
ALT: 32 U/L (ref 0–44)
AST: 27 U/L (ref 15–41)
Albumin: 4.6 g/dL (ref 3.5–5.0)
Alkaline Phosphatase: 71 U/L (ref 38–126)
Anion gap: 15 (ref 5–15)
BUN: 13 mg/dL (ref 8–23)
CO2: 20 mmol/L — ABNORMAL LOW (ref 22–32)
Calcium: 10 mg/dL (ref 8.9–10.3)
Chloride: 105 mmol/L (ref 98–111)
Creatinine, Ser: 1.44 mg/dL — ABNORMAL HIGH (ref 0.61–1.24)
GFR, Estimated: 55 mL/min — ABNORMAL LOW (ref >60)
Glucose, Bld: 137 mg/dL — ABNORMAL HIGH (ref 70–99)
Potassium: 3.7 mmol/L (ref 3.5–5.1)
Sodium: 140 mmol/L (ref 135–145)
Total Bilirubin: 1.1 mg/dL (ref 0.3–1.2)
Total Protein: 8.1 g/dL (ref 6.5–8.1)

## 2023-07-28 LAB — CBC WITH DIFFERENTIAL/PLATELET
Abs Immature Granulocytes: 0.01 10*3/uL (ref 0.00–0.07)
Basophils Absolute: 0 10*3/uL (ref 0.0–0.1)
Basophils Relative: 0 %
Eosinophils Absolute: 0 10*3/uL (ref 0.0–0.5)
Eosinophils Relative: 0 %
HCT: 46 % (ref 39.0–52.0)
Hemoglobin: 14.9 g/dL (ref 13.0–17.0)
Immature Granulocytes: 0 %
Lymphocytes Relative: 6 %
Lymphs Abs: 0.4 10*3/uL — ABNORMAL LOW (ref 0.7–4.0)
MCH: 28.1 pg (ref 26.0–34.0)
MCHC: 32.4 g/dL (ref 30.0–36.0)
MCV: 86.6 fL (ref 80.0–100.0)
Monocytes Absolute: 0.4 10*3/uL (ref 0.1–1.0)
Monocytes Relative: 6 %
Neutro Abs: 6.2 10*3/uL (ref 1.7–7.7)
Neutrophils Relative %: 88 %
Platelets: 152 10*3/uL (ref 150–400)
RBC: 5.31 MIL/uL (ref 4.22–5.81)
RDW: 15.1 % (ref 11.5–15.5)
WBC: 7.1 10*3/uL (ref 4.0–10.5)
nRBC: 0 % (ref 0.0–0.2)

## 2023-07-28 LAB — SARS CORONAVIRUS 2 BY RT PCR: SARS Coronavirus 2 by RT PCR: POSITIVE — AB

## 2023-07-28 LAB — LIPASE, BLOOD: Lipase: 24 U/L (ref 11–51)

## 2023-07-28 LAB — TROPONIN I (HIGH SENSITIVITY)
Troponin I (High Sensitivity): 4 ng/L (ref <18)
Troponin I (High Sensitivity): 6 ng/L (ref <18)

## 2023-07-28 LAB — LACTIC ACID, PLASMA
Lactic Acid, Venous: 2 mmol/L (ref 0.5–1.9)
Lactic Acid, Venous: 2.1 mmol/L (ref 0.5–1.9)

## 2023-07-28 LAB — BRAIN NATRIURETIC PEPTIDE: B Natriuretic Peptide: 15 pg/mL (ref 0.0–100.0)

## 2023-07-28 MED ORDER — BENZONATATE 100 MG PO CAPS
100.0000 mg | ORAL_CAPSULE | Freq: Once | ORAL | Status: AC
  Administered 2023-07-28: 100 mg via ORAL
  Filled 2023-07-28: qty 1

## 2023-07-28 MED ORDER — IPRATROPIUM-ALBUTEROL 0.5-2.5 (3) MG/3ML IN SOLN
3.0000 mL | Freq: Once | RESPIRATORY_TRACT | Status: AC
  Administered 2023-07-28: 3 mL via RESPIRATORY_TRACT
  Filled 2023-07-28: qty 3

## 2023-07-28 MED ORDER — CYCLOBENZAPRINE HCL 10 MG PO TABS
10.0000 mg | ORAL_TABLET | Freq: Once | ORAL | Status: AC
  Administered 2023-07-28: 10 mg via ORAL
  Filled 2023-07-28: qty 1

## 2023-07-28 MED ORDER — ONDANSETRON 4 MG PO TBDP: 4.0000 mg | ORAL_TABLET | Freq: Three times a day (TID) | ORAL | 0 refills | Status: DC | PRN

## 2023-07-28 MED ORDER — OXYCODONE-ACETAMINOPHEN 5-325 MG PO TABS
1.0000 | ORAL_TABLET | Freq: Once | ORAL | Status: AC
  Administered 2023-07-28: 1 via ORAL
  Filled 2023-07-28: qty 1

## 2023-07-28 MED ORDER — ACETAMINOPHEN 325 MG PO TABS
650.0000 mg | ORAL_TABLET | Freq: Once | ORAL | Status: AC
  Administered 2023-07-28: 650 mg via ORAL

## 2023-07-28 MED ORDER — BENZONATATE 100 MG PO CAPS: 100.0000 mg | ORAL_CAPSULE | Freq: Three times a day (TID) | ORAL | 0 refills | Status: DC

## 2023-07-28 MED ORDER — PREDNISONE 10 MG PO TABS: 40.0000 mg | ORAL_TABLET | Freq: Every day | ORAL | 0 refills | Status: AC

## 2023-07-28 MED ORDER — ACETAMINOPHEN 325 MG PO TABS
ORAL_TABLET | ORAL | Status: AC
  Filled 2023-07-28: qty 2

## 2023-07-28 MED ORDER — METHYLPREDNISOLONE SODIUM SUCC 125 MG IJ SOLR
125.0000 mg | Freq: Once | INTRAMUSCULAR | Status: AC
  Administered 2023-07-28: 125 mg via INTRAVENOUS
  Filled 2023-07-28: qty 2

## 2023-07-28 MED ORDER — ONDANSETRON HCL 4 MG/2ML IJ SOLN
4.0000 mg | Freq: Once | INTRAMUSCULAR | Status: AC
  Administered 2023-07-28: 4 mg via INTRAVENOUS
  Filled 2023-07-28: qty 2

## 2023-07-28 MED ORDER — ALBUTEROL SULFATE HFA 108 (90 BASE) MCG/ACT IN AERS
2.0000 | INHALATION_SPRAY | Freq: Once | RESPIRATORY_TRACT | Status: AC
  Administered 2023-07-28: 2 via RESPIRATORY_TRACT
  Filled 2023-07-28: qty 6.7

## 2023-07-28 NOTE — ED Notes (Signed)
PT is having back pain

## 2023-07-28 NOTE — ED Triage Notes (Signed)
Pt here from home with c/o sob  and chest pressure off and on since Monday ,. Has tried his inhaler but had little relief

## 2023-07-28 NOTE — ED Provider Triage Note (Addendum)
Emergency Medicine Provider Triage Evaluation Note  Nicholas [name] , a 62 y.o. male  was evaluated in triage.  Pt complains of tightness in his chest, cough, SOB that started Sunday. +F/c. Nonproductive cough, denies hemoptysis. Endorses 2 episodes of post-tussive emesis this AM. Denies nausea but endorses all-over tightness and pain in his abdomen as well. No sick contacts. Has h/o asthma and COPD. Endorses wheezing. Has been using inhalers more frequently and it isn't helping.  Review of Systems  Positive: Cough, chest tightness, SOB, f/c, h/o CVA Negative: Nausea, leg swelling, h/o MI  Physical Exam  BP 134/69   Pulse 89   Temp 99.3 F (37.4 C)   Resp 18   SpO2 97%  Gen:   Awake, no distress  Resp:  Coughing. CTAB, good air movement, no tachypnea or increased WOB MSK:   Moves extremities without difficulty   Medical Decision Making  Medically screening exam initiated at 11:03 AM.  Appropriate orders placed.  Nicholas [name] was informed that the remainder of the evaluation will be completed by another provider, this initial triage assessment does not replace that evaluation, and the importance of remaining in the ED until their evaluation is complete.     Loetta Rough, MD 07/28/23 [phone]

## 2023-07-28 NOTE — ED Provider Notes (Signed)
[place] EMERGENCY DEPARTMENT AT [facility] Provider Note   CSN: [id number] Arrival date & time: 07/28/23  1010     History {Add pertinent medical, surgical, social history, OB history to HPI:1} Chief Complaint  Patient presents with   Weakness    Nicholas [name] is a 62 y.o. male.  HPI     62 year old male with a history of hypertension, diabetes type 2, seizures, CVA, asthma, bipolar type I, who presents with concern for chest tightness, cough and shortness of breath.  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 Authorizing Provider  acetaminophen (TYLENOL) 325 MG tablet Take 2 tablets (650 mg total) by mouth every 6 (six) hours as needed for mild pain (or Fever >/= 101). 07/01/22   Danford, Earl Lites, MD  albuterol (PROVENTIL HFA;VENTOLIN HFA) 108 (90 Base) MCG/ACT inhaler Inhale 1-2 puffs into the lungs every 6 (six) hours as needed for wheezing or shortness of breath. 11/15/18   Rancour, Jeannett Senior, MD  atorvastatin (LIPITOR) 80 MG tablet Take 1 tablet (80 mg total) by mouth at bedtime. 09/05/20   Danford, Earl Lites, MD  budesonide-formoterol (SYMBICORT) 80-4.5 MCG/ACT inhaler Inhale 2 puffs into the lungs daily. 04/12/23   Lockie Mola, MD  clopidogrel (PLAVIX) 75 MG tablet TAKE 1 TABLET(75 MG) BY MOUTH DAILY 04/15/23   Lockie Mola, MD   cyclobenzaprine (FLEXERIL) 5 MG tablet Take 1 tablet (5 mg total) by mouth every 8 (eight) hours as needed for muscle spasms 07/02/23     diclofenac Sodium (VOLTAREN ARTHRITIS PAIN) 1 % GEL Apply 2 g topically 4 (four) times daily. 07/01/22   Danford, Earl Lites, MD  Eszopiclone 3 MG TABS Take 1 tablet (3 mg total) by mouth at bedtime. 07/01/22   Danford, Earl Lites, MD  fenofibrate (TRICOR) 145 MG tablet TAKE 1 TABLET(145 MG) BY MOUTH DAILY 05/14/23   Lockie Mola, MD  gabapentin (NEURONTIN) 600 MG tablet Take 1 tablet (600 mg total) by mouth daily at 6 (six) AM. Patient taking differently: Take 600 mg by mouth 2 (two) times daily. 12/06/20   Swayze, Ava, DO  insulin aspart (NOVOLOG) 100 UNIT/ML injection Inject into the skin 3 (three) times daily with meals.    [provider]  lactulose (CHRONULAC) 10 GM/15ML solution Take 45 mLs (30 g total) by mouth daily as needed for moderate constipation. 01/15/23   Lockie Mola, MD  latanoprost (XALATAN) 0.005 % ophthalmic solution Place 1 drop into both eyes at bedtime.  07/04/20   [provider]  lidocaine (HM LIDOCAINE PATCH) 4 % Place 1 patch onto the skin daily. 04/15/23   Lockie Mola, MD  lurasidone (LATUDA) 40 MG TABS  tablet Take 1 tablet by mouth at bedtime. 06/14/22   [provider]  metFORMIN (GLUCOPHAGE) 1000 MG tablet Take 1,000 mg by mouth 2 (two) times daily. 11/04/20   [provider]  methocarbamol (ROBAXIN-750) 750 MG tablet Take 1 tablet (750 mg total) by mouth every 8 (eight) hours as needed for muscle spasms. 10/26/22   Cora Collum, DO  nortriptyline (PAMELOR) 50 MG capsule Take 100 mg by mouth at bedtime.     [provider]  oxyCODONE-acetaminophen (PERCOCET) 10-325 MG tablet Take 1 tablet by mouth 4 (four) times daily as directed. 04/06/23     oxyCODONE-acetaminophen (PERCOCET) 10-325 MG tablet Take 1 tablet by mouth 4 (four) times daily as needed. 05/09/23      oxyCODONE-acetaminophen (PERCOCET) 10-325 MG tablet Take 1 tablet by mouth 4 (four) times daily. 06/09/23     oxyCODONE-acetaminophen (PERCOCET) 10-325 MG tablet Take 1 tablet by mouth 4 (four) times daily as directed 07/09/23     SUMAtriptan (IMITREX) 100 MG tablet Take 1 tablet (100 mg total) by mouth daily as needed for migraine or headache. May repeat in 2 hours if headache persists or recurs. 07/25/20   Gherghe, Daylene Katayama, MD  TOUJEO MAX SOLOSTAR 300 UNIT/ML Solostar Pen Inject 33 Units into the skin at bedtime. Patient taking differently: Inject 25 Units into the skin at bedtime. 07/01/22   Danford, Earl Lites, MD  traZODone (DESYREL) 100 MG tablet Take 100 mg by mouth at bedtime. 06/25/22   [provider]  verapamil (VERELAN) 100 MG 24 hr capsule TAKE 1 CAPSULE(100 MG) BY MOUTH AT BEDTIME 04/12/23   Lockie Mola, MD  zonisamide (ZONEGRAN) 100 MG capsule Take 200 mg by mouth 2 (two) times daily. 04/10/22   [provider]      Allergies    Finasteride, Levothyroxine, Nsaids, Amoxicillin, Ampicillin, Penicillins, Strawberry extract, Asa [aspirin], Bactrim [sulfamethoxazole-trimethoprim], Dapagliflozin, Depakote [divalproex sodium], Dilantin [phenytoin], Methocarbamol, Other, Risperidone and related, Sitagliptin, Strawberry (diagnostic), Sulfa antibiotics, Tolmetin, Ultram [tramadol hcl], Buprenorphine, and Oatmeal    Review of Systems   Review of Systems  Physical Exam Updated Vital Signs BP 125/85   Pulse 99   Temp (!) 102.3 F (39.1 C) (Oral)   Resp 20   SpO2 100%  Physical Exam  ED Results / Procedures / Treatments   Labs (all labs ordered are listed, but only abnormal results are displayed) Labs Reviewed  SARS CORONAVIRUS 2 BY RT PCR - Abnormal; Notable for the following components:      Result Value   SARS Coronavirus 2 by RT PCR POSITIVE (*)    All other components within normal limits  CBC WITH DIFFERENTIAL/PLATELET - Abnormal; Notable for the following  components:   Lymphs Abs 0.4 (*)    All other components within normal limits  COMPREHENSIVE METABOLIC PANEL - Abnormal; Notable for the following components:   CO2 20 (*)    Glucose, Bld 137 (*)    Creatinine, Ser 1.44 (*)    GFR, Estimated 55 (*)    All other components within normal limits  LACTIC ACID, PLASMA - Abnormal; Notable for the following components:   Lactic Acid, Venous 2.0 (*)    All other components within normal limits  LIPASE, BLOOD  LACTIC ACID, PLASMA    EKG None  Radiology DG Chest 2 View  Result Date: 07/28/2023 CLINICAL DATA:  cough, f/c, SOB EXAM: CHEST - 2 VIEW COMPARISON:  11/21/2022. FINDINGS: Bilateral lung fields are clear. Bilateral costophrenic angles are clear. Normal  cardio-mediastinal silhouette. No acute osseous abnormalities. Note is made of lower cervical spinal fixation hardware. The soft tissues are within normal limits. IMPRESSION: No active cardiopulmonary disease. Electronically Signed   By: Jules Schick M.D.   On: 07/28/2023 13:27    Procedures Procedures  {Document cardiac monitor, telemetry assessment procedure when appropriate:1}  Medications Ordered in ED Medications  ipratropium-albuterol (DUONEB) 0.5-2.5 (3) MG/3ML nebulizer solution 3 mL (has no administration in time range)  ondansetron (ZOFRAN) injection 4 mg (has no administration in time range)  acetaminophen (TYLENOL) 325 MG tablet (has no administration in time range)  acetaminophen (TYLENOL) tablet 650 mg (650 mg Oral Given 07/28/23 1419)    ED Course/ Medical Decision Making/ A&P   {   Click here for ABCD2, HEART and other calculatorsREFRESH Note before signing :1}                              Medical Decision Making Risk OTC drugs.   ***  Labs completed and personally about interpreted by me show no anemia, no leukocytosis, creatinine similar to prior values with mild increase since May, lipase within normal limits, lactic acid of 2.  COVID-19 testing is  positive.  Chest x-ray shows no evidence of pneumonia, pneumothorax, or pulmonary edema.  {Document critical care time when appropriate:1} {Document review of labs and clinical decision tools ie heart score, Chads2Vasc2 etc:1}  {Document your independent review of radiology images, and any outside records:1} {Document your discussion with family members, caretakers, and with consultants:1} {Document social determinants of health affecting pt's care:1} {Document your decision making why or why not admission, treatments were needed:1} Final Clinical Impression(s) / ED Diagnoses Final diagnoses:  None    Rx / DC Orders ED Discharge Orders     None

## 2023-08-03 DIAGNOSIS — Z1152 Encounter for screening for COVID-19: Secondary | ICD-10-CM | POA: Diagnosis not present

## 2023-08-04 DIAGNOSIS — E119 Type 2 diabetes mellitus without complications: Secondary | ICD-10-CM | POA: Diagnosis not present

## 2023-08-05 DIAGNOSIS — R6889 Other general symptoms and signs: Secondary | ICD-10-CM | POA: Diagnosis not present

## 2023-08-10 ENCOUNTER — Other Ambulatory Visit (HOSPITAL_COMMUNITY): Payer: Self-pay

## 2023-08-10 MED ORDER — OXYCODONE-ACETAMINOPHEN 10-325 MG PO TABS
1.0000 | ORAL_TABLET | Freq: Four times a day (QID) | ORAL | 0 refills | Status: AC
Start: 2023-08-10 — End: ?
  Filled 2023-08-10: qty 120, 30d supply, fill #0

## 2023-08-11 DIAGNOSIS — R32 Unspecified urinary incontinence: Secondary | ICD-10-CM | POA: Diagnosis not present

## 2023-08-13 ENCOUNTER — Ambulatory Visit (INDEPENDENT_AMBULATORY_CARE_PROVIDER_SITE_OTHER): Payer: Medicare HMO | Admitting: Neurology

## 2023-08-13 ENCOUNTER — Encounter: Payer: Self-pay | Admitting: Neurology

## 2023-08-13 VITALS — BP 116/76 | HR 95 | Ht 67.0 in | Wt 191.6 lb

## 2023-08-13 DIAGNOSIS — G40909 Epilepsy, unspecified, not intractable, without status epilepticus: Secondary | ICD-10-CM

## 2023-08-13 DIAGNOSIS — F445 Conversion disorder with seizures or convulsions: Secondary | ICD-10-CM

## 2023-08-13 DIAGNOSIS — G43009 Migraine without aura, not intractable, without status migrainosus: Secondary | ICD-10-CM | POA: Diagnosis not present

## 2023-08-13 DIAGNOSIS — R6889 Other general symptoms and signs: Secondary | ICD-10-CM | POA: Diagnosis not present

## 2023-08-13 MED ORDER — RIZATRIPTAN BENZOATE 10 MG PO TBDP: 10.0000 mg | ORAL_TABLET | ORAL | 11 refills | Status: DC | PRN

## 2023-08-13 MED ORDER — ZONISAMIDE 100 MG PO CAPS: ORAL_CAPSULE | ORAL | 11 refills | Status: DC

## 2023-08-13 NOTE — Patient Instructions (Signed)
Good to meet you.  Refills sent for Zonisamide 100mg : Take 2 capsules twice a day  2. A prescription for a different migraine rescue medication called Maxalt was sent to your pharmacy. Let me know if it is not helpful  3. Please ask your Behavioral Health Nurse practitioner about the nortriptyline because it also helps with mood  4. Follow-up in 4 months, call for any changes   Seizure Precautions: 1. If medication has been prescribed for you to prevent seizures, take it exactly as directed.  Do not stop taking the medicine without talking to your doctor first, even if you have not had a seizure in a long time.   2. Avoid activities in which a seizure would cause danger to yourself or to others.  Don't operate dangerous machinery, swim alone, or climb in high or dangerous places, such as on ladders, roofs, or girders.  Do not drive unless your doctor says you may.  3. If you have any warning that you may have a seizure, lay down in a safe place where you can't hurt yourself.    4.  No driving for 6 months from last seizure, as per [facility].   Please refer to the following link on the [employer]ile[employer] of [employer]'s website for more information: http://www.epilepsyfoundation.org/answerplace/Social/driving/drivingu.cfm   5.  Maintain good sleep hygiene. Avoid alcohol  6.  Contact your doctor if you have any problems that may be related to the medicine you are taking.  7.  Call 911 and bring the patient back to the ED if:        A.  The seizure lasts longer than 5 minutes.       B.  The patient doesn't awaken shortly after the seizure  C.  The patient has new problems such as difficulty seeing, speaking or moving  D.  The patient was injured during the seizure  E.  The patient has a temperature over 102 F (39C)  F.  The patient vomited and now is having trouble breathing
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
## 2023-08-17 DIAGNOSIS — M961 Postlaminectomy syndrome, not elsewhere classified: Secondary | ICD-10-CM | POA: Diagnosis not present

## 2023-08-17 DIAGNOSIS — M25511 Pain in right shoulder: Secondary | ICD-10-CM | POA: Diagnosis not present

## 2023-08-17 DIAGNOSIS — I429 Cardiomyopathy, unspecified: Secondary | ICD-10-CM | POA: Diagnosis not present

## 2023-08-17 DIAGNOSIS — M542 Cervicalgia: Secondary | ICD-10-CM | POA: Diagnosis not present

## 2023-08-17 DIAGNOSIS — Z5181 Encounter for therapeutic drug level monitoring: Secondary | ICD-10-CM | POA: Diagnosis not present

## 2023-08-17 DIAGNOSIS — Z79899 Other long term (current) drug therapy: Secondary | ICD-10-CM | POA: Diagnosis not present

## 2023-08-18 ENCOUNTER — Telehealth: Payer: Self-pay

## 2023-08-18 NOTE — Telephone Encounter (Signed)
Pt needs a PA for Rizatriptan oDT 10mg 

## 2023-08-19 ENCOUNTER — Encounter (HOSPITAL_COMMUNITY): Payer: Self-pay

## 2023-08-19 ENCOUNTER — Telehealth: Payer: Self-pay

## 2023-08-19 ENCOUNTER — Other Ambulatory Visit: Payer: Self-pay

## 2023-08-19 ENCOUNTER — Other Ambulatory Visit (HOSPITAL_COMMUNITY): Payer: Self-pay

## 2023-08-19 ENCOUNTER — Emergency Department (HOSPITAL_COMMUNITY): Payer: Medicare HMO

## 2023-08-19 ENCOUNTER — Emergency Department (HOSPITAL_COMMUNITY)
Admission: EM | Admit: 2023-08-19 | Discharge: 2023-08-19 | Disposition: A | Discharge status: Home / Self Care | Payer: Medicare HMO | Attending: Emergency Medicine | Admitting: Emergency Medicine

## 2023-08-19 DIAGNOSIS — Z1152 Encounter for screening for COVID-19: Secondary | ICD-10-CM | POA: Insufficient documentation

## 2023-08-19 DIAGNOSIS — E860 Dehydration: Secondary | ICD-10-CM | POA: Insufficient documentation

## 2023-08-19 DIAGNOSIS — R6883 Chills (without fever): Secondary | ICD-10-CM | POA: Diagnosis not present

## 2023-08-19 DIAGNOSIS — Z8673 Personal history of transient ischemic attack (TIA), and cerebral infarction without residual deficits: Secondary | ICD-10-CM | POA: Insufficient documentation

## 2023-08-19 DIAGNOSIS — N189 Chronic kidney disease, unspecified: Secondary | ICD-10-CM | POA: Diagnosis not present

## 2023-08-19 DIAGNOSIS — R0602 Shortness of breath: Secondary | ICD-10-CM | POA: Diagnosis not present

## 2023-08-19 DIAGNOSIS — J45909 Unspecified asthma, uncomplicated: Secondary | ICD-10-CM | POA: Diagnosis not present

## 2023-08-19 DIAGNOSIS — Z79899 Other long term (current) drug therapy: Secondary | ICD-10-CM | POA: Diagnosis not present

## 2023-08-19 DIAGNOSIS — Z7984 Long term (current) use of oral hypoglycemic drugs: Secondary | ICD-10-CM | POA: Insufficient documentation

## 2023-08-19 DIAGNOSIS — R5383 Other fatigue: Secondary | ICD-10-CM | POA: Diagnosis not present

## 2023-08-19 DIAGNOSIS — E1122 Type 2 diabetes mellitus with diabetic chronic kidney disease: Secondary | ICD-10-CM | POA: Diagnosis not present

## 2023-08-19 DIAGNOSIS — R197 Diarrhea, unspecified: Secondary | ICD-10-CM | POA: Insufficient documentation

## 2023-08-19 DIAGNOSIS — R059 Cough, unspecified: Secondary | ICD-10-CM | POA: Insufficient documentation

## 2023-08-19 DIAGNOSIS — R000 Tachycardia, unspecified: Secondary | ICD-10-CM | POA: Insufficient documentation

## 2023-08-19 DIAGNOSIS — I129 Hypertensive chronic kidney disease with stage 1 through stage 4 chronic kidney disease, or unspecified chronic kidney disease: Secondary | ICD-10-CM | POA: Diagnosis not present

## 2023-08-19 DIAGNOSIS — R051 Acute cough: Secondary | ICD-10-CM

## 2023-08-19 DIAGNOSIS — R079 Chest pain, unspecified: Secondary | ICD-10-CM | POA: Diagnosis present

## 2023-08-19 DIAGNOSIS — R058 Other specified cough: Secondary | ICD-10-CM | POA: Diagnosis not present

## 2023-08-19 DIAGNOSIS — J9811 Atelectasis: Secondary | ICD-10-CM | POA: Diagnosis not present

## 2023-08-19 DIAGNOSIS — Z794 Long term (current) use of insulin: Secondary | ICD-10-CM | POA: Insufficient documentation

## 2023-08-19 DIAGNOSIS — Z7902 Long term (current) use of antithrombotics/antiplatelets: Secondary | ICD-10-CM | POA: Insufficient documentation

## 2023-08-19 DIAGNOSIS — R0781 Pleurodynia: Secondary | ICD-10-CM | POA: Diagnosis not present

## 2023-08-19 DIAGNOSIS — Z4789 Encounter for other orthopedic aftercare: Secondary | ICD-10-CM | POA: Diagnosis not present

## 2023-08-19 DIAGNOSIS — R0989 Other specified symptoms and signs involving the circulatory and respiratory systems: Secondary | ICD-10-CM | POA: Diagnosis not present

## 2023-08-19 LAB — COMPREHENSIVE METABOLIC PANEL
ALT: 34 U/L (ref 0–44)
AST: 28 U/L (ref 15–41)
Albumin: 4.2 g/dL (ref 3.5–5.0)
Alkaline Phosphatase: 70 U/L (ref 38–126)
Anion gap: 13 (ref 5–15)
BUN: 11 mg/dL (ref 8–23)
CO2: 18 mmol/L — ABNORMAL LOW (ref 22–32)
Calcium: 9.2 mg/dL (ref 8.9–10.3)
Chloride: 108 mmol/L (ref 98–111)
Creatinine, Ser: 1.39 mg/dL — ABNORMAL HIGH (ref 0.61–1.24)
GFR, Estimated: 58 mL/min — ABNORMAL LOW (ref >60)
Glucose, Bld: 176 mg/dL — ABNORMAL HIGH (ref 70–99)
Potassium: 3.4 mmol/L — ABNORMAL LOW (ref 3.5–5.1)
Sodium: 139 mmol/L (ref 135–145)
Total Bilirubin: 0.5 mg/dL (ref 0.3–1.2)
Total Protein: 8.1 g/dL (ref 6.5–8.1)

## 2023-08-19 LAB — CBC WITH DIFFERENTIAL/PLATELET
Abs Immature Granulocytes: 0.02 10*3/uL (ref 0.00–0.07)
Basophils Absolute: 0 10*3/uL (ref 0.0–0.1)
Basophils Relative: 0 %
Eosinophils Absolute: 0 10*3/uL (ref 0.0–0.5)
Eosinophils Relative: 0 %
HCT: 48.8 % (ref 39.0–52.0)
Hemoglobin: 15 g/dL (ref 13.0–17.0)
Immature Granulocytes: 0 %
Lymphocytes Relative: 11 %
Lymphs Abs: 0.7 10*3/uL (ref 0.7–4.0)
MCH: 26.8 pg (ref 26.0–34.0)
MCHC: 30.7 g/dL (ref 30.0–36.0)
MCV: 87.1 fL (ref 80.0–100.0)
Monocytes Absolute: 0.6 10*3/uL (ref 0.1–1.0)
Monocytes Relative: 10 %
Neutro Abs: 4.9 10*3/uL (ref 1.7–7.7)
Neutrophils Relative %: 79 %
Platelets: 170 10*3/uL (ref 150–400)
RBC: 5.6 MIL/uL (ref 4.22–5.81)
RDW: 14.8 % (ref 11.5–15.5)
WBC: 6.2 10*3/uL (ref 4.0–10.5)
nRBC: 0 % (ref 0.0–0.2)

## 2023-08-19 LAB — URINALYSIS, W/ REFLEX TO CULTURE (INFECTION SUSPECTED)
Glucose, UA: 100 mg/dL — AB
Hgb urine dipstick: NEGATIVE
Ketones, ur: 15 mg/dL — AB
Leukocytes,Ua: NEGATIVE
Nitrite: NEGATIVE
Protein, ur: 100 mg/dL — AB
Specific Gravity, Urine: >1.03 — ABNORMAL HIGH (ref 1.005–1.030)
pH: 6 (ref 5.0–8.0)

## 2023-08-19 LAB — PROTIME-INR
INR: 1.2 (ref 0.8–1.2)
Prothrombin Time: 15.1 s (ref 11.4–15.2)

## 2023-08-19 LAB — RESP PANEL BY RT-PCR (RSV, FLU A&B, COVID)  RVPGX2
Influenza A by PCR: NEGATIVE
Influenza B by PCR: NEGATIVE
Resp Syncytial Virus by PCR: NEGATIVE
SARS Coronavirus 2 by RT PCR: NEGATIVE

## 2023-08-19 LAB — I-STAT CG4 LACTIC ACID, ED
Lactic Acid, Venous: 2.7 mmol/L (ref 0.5–1.9)
Lactic Acid, Venous: 1.6 mmol/L (ref 0.5–1.9)

## 2023-08-19 LAB — CK: Total CK: 290 U/L (ref 49–397)

## 2023-08-19 LAB — MAGNESIUM: Magnesium: 1.4 mg/dL — ABNORMAL LOW (ref 1.7–2.4)

## 2023-08-19 LAB — LIPASE, BLOOD: Lipase: 21 U/L (ref 11–51)

## 2023-08-19 MED ORDER — PROCHLORPERAZINE EDISYLATE 10 MG/2ML IJ SOLN
10.0000 mg | Freq: Once | INTRAMUSCULAR | Status: AC
  Administered 2023-08-19: 10 mg via INTRAVENOUS
  Filled 2023-08-19: qty 2

## 2023-08-19 MED ORDER — IOHEXOL 350 MG/ML SOLN
60.0000 mL | Freq: Once | INTRAVENOUS | Status: AC | PRN
  Administered 2023-08-19: 60 mL via INTRAVENOUS

## 2023-08-19 MED ORDER — SODIUM CHLORIDE 0.9 % IV BOLUS
1000.0000 mL | Freq: Once | INTRAVENOUS | Status: AC
  Administered 2023-08-19: 1000 mL via INTRAVENOUS

## 2023-08-19 MED ORDER — DIPHENHYDRAMINE HCL 50 MG/ML IJ SOLN
50.0000 mg | Freq: Once | INTRAMUSCULAR | Status: AC
  Administered 2023-08-19: 50 mg via INTRAVENOUS
  Filled 2023-08-19: qty 1

## 2023-08-19 MED ORDER — ONDANSETRON 4 MG PO TBDP: 4.0000 mg | ORAL_TABLET | Freq: Three times a day (TID) | ORAL | 0 refills | Status: AC | PRN

## 2023-08-19 MED ORDER — MAGNESIUM OXIDE -MG SUPPLEMENT 400 (240 MG) MG PO TABS
400.0000 mg | ORAL_TABLET | Freq: Once | ORAL | Status: AC
  Administered 2023-08-19: 400 mg via ORAL
  Filled 2023-08-19: qty 1

## 2023-08-19 NOTE — ED Notes (Signed)
Po fluids completed and pt denies nausea.

## 2023-08-19 NOTE — Telephone Encounter (Signed)
PA request has been Submitted. New Encounter created for follow up. For additional info see Pharmacy Prior Auth telephone encounter from 09/12.

## 2023-08-19 NOTE — Telephone Encounter (Signed)
*  [name]  Pharmacy Patient Advocate Encounter   Received notification from Pt Calls Messages that prior authorization for Rizatriptan Benzoate 10MG  dispersible tablets  is required/requested.   Insurance verification completed.   The patient is insured through [employer] .   Per test claim: PA required; PA started via CoverMyMeds. KEY [id number] . Waiting for clinical questions to populate.

## 2023-08-19 NOTE — ED Notes (Signed)
Patient transported to CT 

## 2023-08-19 NOTE — ED Provider Notes (Signed)
[place] EMERGENCY DEPARTMENT AT [facility] Provider Note   CSN: [id number] Arrival date & time: 08/19/23  1536     History  No chief complaint on file.   Nicholas [name] is a 62 y.o. male.  The history is provided by the patient and medical records. No language interpreter was used.  Chest Pain Pain location:  L lateral chest, R lateral chest, L chest, R chest and substernal area Pain quality: aching and tightness   Pain radiates to:  Does not radiate Pain severity:  Severe Onset quality:  Gradual Duration:  3 days Timing:  Constant Progression:  Waxing and waning Chronicity:  New Context: breathing   Worsened by:  Coughing and deep breathing Ineffective treatments:  None tried Associated symptoms: back pain, cough, fatigue, headache, nausea and shortness of breath   Associated symptoms: no abdominal pain, no altered mental status, no diaphoresis, no lower extremity edema, no near-syncope, no numbness, no palpitations, no vomiting and no weakness        Home Medications Prior to Admission medications   Medication Sig Start Date End Date Taking? Authorizing Provider  acetaminophen (TYLENOL) 325 MG tablet Take 2 tablets (650 mg total) by mouth every 6 (six) hours as needed for mild pain (or Fever >/= 101). 07/01/22   Danford, Earl Lites, MD  albuterol (PROVENTIL HFA;VENTOLIN HFA) 108 (90 Base) MCG/ACT inhaler Inhale 1-2 puffs into the lungs every 6 (six) hours as needed for wheezing or shortness of breath. 11/15/18   Rancour, Jeannett Senior, MD  atorvastatin (LIPITOR) 80 MG tablet Take 1 tablet (80 mg total) by mouth at bedtime. 09/05/20   Danford, Earl Lites, MD  budesonide-formoterol (SYMBICORT) 80-4.5 MCG/ACT inhaler Inhale 2 puffs into the lungs daily. 04/12/23   Lockie Mola, MD  clopidogrel (PLAVIX) 75 MG tablet TAKE 1 TABLET(75 MG) BY MOUTH DAILY 04/15/23   Lockie Mola, MD  cyclobenzaprine (FLEXERIL) 5 MG tablet Take 1 tablet (5 mg total) by mouth every 8  (eight) hours as needed for muscle spasms 07/02/23     diclofenac Sodium (VOLTAREN ARTHRITIS PAIN) 1 % GEL Apply 2 g topically 4 (four) times daily. 07/01/22   Danford, Earl Lites, MD  Eszopiclone 3 MG TABS Take 1 tablet (3 mg total) by mouth at bedtime. 07/01/22   Danford, Earl Lites, MD  fenofibrate (TRICOR) 145 MG tablet TAKE 1 TABLET(145 MG) BY MOUTH DAILY 05/14/23   Lockie Mola, MD  gabapentin (NEURONTIN) 600 MG tablet Take 1 tablet (600 mg total) by mouth daily at 6 (six) AM. Patient taking differently: Take 600 mg by mouth 2 (two) times daily. 12/06/20   Swayze, Ava, DO  insulin aspart (NOVOLOG) 100 UNIT/ML injection Inject into the skin 3 (three) times daily with meals.    [provider]  lactulose (CHRONULAC) 10 GM/15ML solution Take 45 mLs (30 g total) by mouth daily as needed for moderate constipation. 01/15/23   Lockie Mola, MD  latanoprost (XALATAN) 0.005 % ophthalmic solution Place 1 drop into both eyes at bedtime.  07/04/20   [provider]  lidocaine (HM LIDOCAINE PATCH) 4 % Place 1 patch onto the skin daily. 04/15/23   Lockie Mola, MD  lurasidone (LATUDA) 40 MG TABS tablet Take 1 tablet by mouth at bedtime. 06/14/22   [provider]  metFORMIN (GLUCOPHAGE) 1000 MG tablet Take 1,000 mg by mouth 2 (two) times daily. 11/04/20   [provider]  methocarbamol (ROBAXIN-750) 750 MG tablet Take 1 tablet (750 mg total) by mouth every  8 (eight) hours as needed for muscle spasms. 10/26/22   Cora Collum, DO  nortriptyline (PAMELOR) 50 MG capsule Take 100 mg by mouth at bedtime.     [provider]  ondansetron (ZOFRAN-ODT) 4 MG disintegrating tablet Take 1 tablet (4 mg total) by mouth every 8 (eight) hours as needed for nausea or vomiting. 07/28/23   Alvira Monday, MD  oxyCODONE-acetaminophen (PERCOCET) 10-325 MG tablet Take 1 tablet by mouth 4 (four) times daily as directed. 04/06/23     oxyCODONE-acetaminophen (PERCOCET) 10-325 MG  tablet Take 1 tablet by mouth 4 (four) times daily as needed. 05/09/23     oxyCODONE-acetaminophen (PERCOCET) 10-325 MG tablet Take 1 tablet by mouth 4 (four) times daily. 06/09/23     oxyCODONE-acetaminophen (PERCOCET) 10-325 MG tablet Take 1 tablet by mouth 4 (four) times daily as directed 07/09/23     oxyCODONE-acetaminophen (PERCOCET) 10-325 MG tablet Take 1 tablet by mouth 4 (four) times daily as directed 08/10/23     rizatriptan (MAXALT-MLT) 10 MG disintegrating tablet Take 1 tablet (10 mg total) by mouth as needed for migraine. May repeat in 2 hours if needed 08/13/23   Van Clines, MD  SUMAtriptan (IMITREX) 100 MG tablet Take 1 tablet (100 mg total) by mouth daily as needed for migraine or headache. May repeat in 2 hours if headache persists or recurs. Patient not taking: Reported on 08/13/2023 07/25/20   Leatha Gilding, MD  TOUJEO MAX SOLOSTAR 300 UNIT/ML Solostar Pen Inject 33 Units into the skin at bedtime. Patient taking differently: Inject 25 Units into the skin at bedtime. 07/01/22   Danford, Earl Lites, MD  traZODone (DESYREL) 100 MG tablet Take 100 mg by mouth at bedtime. 06/25/22   [provider]  verapamil (VERELAN) 100 MG 24 hr capsule TAKE 1 CAPSULE(100 MG) BY MOUTH AT BEDTIME 04/12/23   Lockie Mola, MD  zonisamide (ZONEGRAN) 100 MG capsule Take 2 capsules twice a day 08/13/23   Van Clines, MD      Allergies    Finasteride, Levothyroxine, Nsaids, Prednisone, Amoxicillin, Ampicillin, Penicillins, Strawberry extract, Asa [aspirin], Bactrim [sulfamethoxazole-trimethoprim], Dapagliflozin, Depakote [divalproex sodium], Dilantin [phenytoin], Methocarbamol, Other, Risperidone and related, Sitagliptin, Strawberry (diagnostic), Sulfa antibiotics, Tolmetin, Ultram [tramadol hcl], Buprenorphine, and Oatmeal    Review of Systems   Review of Systems  Constitutional:  Positive for chills and fatigue. Negative for diaphoresis.  HENT:  Positive for congestion.   Eyes:  Negative for  visual disturbance.  Respiratory:  Positive for cough, chest tightness and shortness of breath. Negative for wheezing.   Cardiovascular:  Positive for chest pain. Negative for palpitations, leg swelling and near-syncope.  Gastrointestinal:  Positive for diarrhea and nausea. Negative for abdominal pain, constipation and vomiting.  Genitourinary:  Positive for decreased urine volume. Negative for dysuria and flank pain.  Musculoskeletal:  Positive for back pain. Negative for neck pain and neck stiffness.  Skin:  Negative for rash and wound.  Neurological:  Positive for headaches. Negative for speech difficulty, weakness, light-headedness and numbness.  Psychiatric/Behavioral:  Negative for agitation.   All other systems reviewed and are negative.   Physical Exam Updated Vital Signs BP 128/81 (BP Location: Right Arm)   Pulse (!) 132   Temp 98.4 F (36.9 C) (Oral)   Resp 19   SpO2 97%  Physical Exam Vitals and nursing note reviewed.  Constitutional:      General: He is not in acute distress.    Appearance: He is well-developed. He is not ill-appearing,  toxic-appearing or diaphoretic.  HENT:     Head: Normocephalic and atraumatic.     Nose: No congestion or rhinorrhea.     Mouth/Throat:     Mouth: Mucous membranes are dry.     Pharynx: No oropharyngeal exudate or posterior oropharyngeal erythema.  Eyes:     Extraocular Movements: Extraocular movements intact.     Conjunctiva/sclera: Conjunctivae normal.     Pupils: Pupils are equal, round, and reactive to light.  Cardiovascular:     Rate and Rhythm: Regular rhythm. Tachycardia present.     Heart sounds: No murmur heard. Pulmonary:     Effort: Pulmonary effort is normal. Tachypnea present. No respiratory distress.     Breath sounds: Rhonchi present. No wheezing or rales.  Chest:     Chest wall: No tenderness.  Abdominal:     Palpations: Abdomen is soft.     Tenderness: There is no abdominal tenderness. There is no right CVA  tenderness, guarding or rebound.  Musculoskeletal:        General: No swelling.     Cervical back: Neck supple. No tenderness.     Right lower leg: No tenderness. No edema.     Left lower leg: No tenderness. No edema.  Skin:    General: Skin is warm and dry.     Capillary Refill: Capillary refill takes less than 2 seconds.     Findings: No erythema or rash.  Neurological:     General: No focal deficit present.     Mental Status: He is alert.  Psychiatric:        Mood and Affect: Mood normal.     ED Results / Procedures / Treatments   Labs (all labs ordered are listed, but only abnormal results are displayed) Labs Reviewed  COMPREHENSIVE METABOLIC PANEL - Abnormal; Notable for the following components:      Result Value   Potassium 3.4 (*)    CO2 18 (*)    Glucose, Bld 176 (*)    Creatinine, Ser 1.39 (*)    GFR, Estimated 58 (*)    All other components within normal limits  URINALYSIS, W/ REFLEX TO CULTURE (INFECTION SUSPECTED) - Abnormal; Notable for the following components:   Specific Gravity, Urine >1.030 (*)    Glucose, UA 100 (*)    Bilirubin Urine SMALL (*)    Ketones, ur 15 (*)    Protein, ur 100 (*)    Bacteria, UA FEW (*)    All other components within normal limits  MAGNESIUM - Abnormal; Notable for the following components:   Magnesium 1.4 (*)    All other components within normal limits  I-STAT CG4 LACTIC ACID, ED - Abnormal; Notable for the following components:   Lactic Acid, Venous 2.7 (*)    All other components within normal limits  RESP PANEL BY RT-PCR (RSV, FLU A&B, COVID)  RVPGX2  CULTURE, BLOOD (ROUTINE X 2)  CULTURE, BLOOD (ROUTINE X 2)  CBC WITH DIFFERENTIAL/PLATELET  PROTIME-INR  CK  LIPASE, BLOOD  I-STAT CG4 LACTIC ACID, ED    EKG EKG Interpretation Date/Time:  Thursday August 19 2023 16:13:45 EDT Ventricular Rate:  122 PR Interval:  148 QRS Duration:  80 QT Interval:  300 QTC Calculation: 427 R Axis:   -48  Text  Interpretation: Sinus tachycardia Left axis deviation Cannot rule out Anterior infarct , age undetermined Abnormal ECG When compared with ECG of 28-Jul-2023 10:25, PREVIOUS ECG IS PRESENT when compared to prior, faster rate than prior No STEMI  Confirmed by Theda Belfast ([id number]) on 08/19/2023 4:44:49 PM  Radiology CT Angio Chest PE W and/or Wo Contrast  Result Date: 08/19/2023 CLINICAL DATA:  Recent COVID infection with worsening chills, cough, shortness of breath and pleuritic chest pain associated with tachycardia EXAM: CT ANGIOGRAPHY CHEST WITH CONTRAST TECHNIQUE: Multidetector CT imaging of the chest was performed using the standard protocol during bolus administration of intravenous contrast. Multiplanar CT image reconstructions and MIPs were obtained to evaluate the vascular anatomy. RADIATION DOSE REDUCTION: This exam was performed according to the departmental dose-optimization program which includes automated exposure control, adjustment of the mA and/or kV according to patient size and/or use of iterative reconstruction technique. CONTRAST:  60mL OMNIPAQUE IOHEXOL 350 MG/ML SOLN COMPARISON:  CT cardiac dated 10/24/2020, chest radiograph dated 08/19/2023 FINDINGS: Cardiovascular: The study is acceptable for the evaluation of pulmonary embolism. There are no filling defects in the central, lobar, segmental or subsegmental pulmonary artery branches to suggest acute pulmonary embolism. Great vessels are normal in course and caliber. Normal heart size. No significant pericardial fluid/thickening. Mediastinum/Nodes: Imaged thyroid gland without nodules meeting criteria for imaging follow-up by size. Normal esophagus. No pathologically enlarged axillary, supraclavicular, mediastinal, or hilar lymph nodes. Lungs/Pleura: The central airways are patent. AP luminal narrowing of the trachea. Subsegmental right lower lobe atelectasis. No pneumothorax. No pleural effusion. Upper abdomen: Cholecystectomy.  Musculoskeletal: No acute or abnormal lytic or blastic osseous lesions. Multilevel degenerative changes of the thoracic spine. Review of the MIP images confirms the above findings. IMPRESSION: 1. No evidence of pulmonary embolism. 2. AP luminal narrowing of the trachea, which can be seen in the setting of tracheomalacia. Electronically Signed   By: Agustin Cree M.D.   On: 08/19/2023 19:27   DG Chest 2 View  Result Date: 08/19/2023 CLINICAL DATA:  Chest pain radiating to the back associated with shortness of breath and productive cough EXAM: CHEST - 2 VIEW COMPARISON:  Chest radiograph dated 07/28/2023 FINDINGS: Low lung volumes with bronchovascular crowding. Left basilar linear opacities. No pleural effusion or pneumothorax. The heart size and mediastinal contours are within normal limits. Cervical spinal fixation hardware appears intact. IMPRESSION: Low lung volumes with left basilar atelectasis. Electronically Signed   By: Agustin Cree M.D.   On: 08/19/2023 19:19    Procedures Procedures    Medications Ordered in ED Medications  sodium chloride 0.9 % bolus 1,000 mL (0 mLs Intravenous Stopped 08/19/23 2051)  prochlorperazine (COMPAZINE) injection 10 mg (10 mg Intravenous Given 08/19/23 1731)  diphenhydrAMINE (BENADRYL) injection 50 mg (50 mg Intravenous Given 08/19/23 1730)  iohexol (OMNIPAQUE) 350 MG/ML injection 60 mL (60 mLs Intravenous Contrast Given 08/19/23 1842)  magnesium oxide (MAG-OX) tablet 400 mg (400 mg Oral Given 08/19/23 2118)    ED Course/ Medical Decision Making/ A&P                                 Medical Decision Making Amount and/or Complexity of Data Reviewed Labs: ordered. Radiology: ordered.  Risk OTC drugs. Prescription drug management.    Nicholas [name] is a 62 y.o. male with a past medical history significant for diabetes, CKD, hypertension, seizures, previous stroke, cardiomyopathy, hyperlipidemia, asthma, and recent COVID-19 infection 2 weeks ago who presents with  3 days of worsening subjective fevers, chills, cough, pleuritic chest pain, shortness of breath, diffuse malaise, aching, diarrhea, nausea, and decreased urine.  According to patient, for the last 3 days has been feeling worse.  He reports that he has had a cough persistent since COVID but symptoms drastically change over the last few days.  He reports he is having shortness of breath and very pleuritic chest discomfort.  Denies history of blood clots to his knowledge.  Denies any trauma.  Reports he has a nausea but no vomiting.  He has had diarrhea and reports his urine has decreased.  He denies focal abdominal pain for me.  Reports pain across his chest goes around towards his back.  He reports no new leg swelling but reports he has cramps diffusely.  He reports he is having a migrainous type headache with photophobia and is triptan medication has not been helping.  He thinks his symptoms have triggered a migraine headache.  On exam, chest was nontender but patient was tachycardic in the 130s.  Lungs did have some coarseness but no significant wheezing or rales for me.  Abdomen was nontender with normal bowel sounds.  Legs were nontender and had intact sensation strength and pulses in extremities.  Pupils are symmetric and reactive with normal extraocular movements.  Clear speech.  Patient has dry mucous membranes.  Clinically I suspect patient may have either a post COVID-pneumonia, persistent COVID, or even a pulmonary embolism given his pleuritic chest pain and shortness of breath and tachycardia.  He does appear dehydrated from all the diarrhea and decreased oral intake over the last few days.  Will give some fluids and a headache cocktail.  Will order CT PE study given my high concern for PE.  Will get labs, check urinalysis given his decreased urine, and check CK given the diffuse cramping he is having in extremities.  Given his lack of reported neck pain or neck stiffness and exam have low suspicion  for meningitis at this time.  Anticipate reassessment after workup to determine disposition.  9:27 PM Patient's workup continues to return.  CT PE study did not show evidence of pulmonary embolism or pneumonia at this time.   Chest x-ray showed some atelectasis.  CBC reassuring, CMP shows some elevation in creatinine but improved from prior.  Urinalysis shows no nitrites and no leukocytes, doubt UTI given lack of urinary symptoms.  Magnesium slightly low at 1.4, will give oral magnesium.  Lipase not elevated.  Lactic acid initially elevated than normalized.  CK not elevated with his cramping.  Clinically I suspect he is had some symptoms related to his recent COVID but also his GI symptoms of diarrhea leading to some dehydration.  After fluids, he is feeling much better.  He is denying any pain and is feeling well.  He would like to go home.  If patient passes p.o. challenge, anticipate discharge home with prescription for nausea medicine and instructions to follow with the PCP.  He had no depressions or concerns, dissipate discharge home after p.o. challenge.        Final Clinical Impression(s) / ED Diagnoses Final diagnoses:  Acute cough  Fatigue, unspecified type  Dehydration  Diarrhea, unspecified type    Rx / DC Orders ED Discharge Orders          Ordered    ondansetron (ZOFRAN-ODT) 4 MG disintegrating tablet  Every 8 hours PRN        08/19/23 2137           Clinical Impression: 1. Acute cough   2. Fatigue, unspecified type   3. Dehydration   4. Diarrhea, unspecified type     Disposition: Discharge  Condition: Good  I  have discussed the results, Dx and Tx plan with the pt(& family if present). He/she/they expressed understanding and agree(s) with the plan. Discharge instructions discussed at great length. Strict return precautions discussed and pt &/or family have verbalized understanding of the instructions. No further questions at time of discharge.    New  Prescriptions   ONDANSETRON (ZOFRAN-ODT) 4 MG DISINTEGRATING TABLET    Take 1 tablet (4 mg total) by mouth every 8 (eight) hours as needed for nausea or vomiting.    Follow Up: Quita Skye, PA-C [address] [place] [address] [phone]     [facility] Emergency Department at [facility] [address] [place] [address] [phone]         Tyechia Allmendinger, Canary Brim, MD 08/19/23 2138

## 2023-08-19 NOTE — Discharge Instructions (Signed)
Your history, exam, workup today did not show evidence of post-COVID pneumonia or blood clots.  Suspect you were slightly dehydrated related to the diarrhea you have been having and likely other viral illness.  Your symptoms improved after some fluids and medication and given your improvement in appearance and vital signs, I do agree you appear safe for discharge home.  Please use the nausea medicine to help maintain hydration and rest.  Please follow-up with your primary doctor.  If any symptoms change or worsen acutely, please return to the nearest emergency department.

## 2023-08-19 NOTE — ED Triage Notes (Signed)
Pt c/o lack of taste, feeling hot, cp radiating to back, sob, non productive cough; generalized weakness since Tuesday; states has been around a sick nephew

## 2023-08-19 NOTE — ED Notes (Signed)
Ginger ale given for po challenge

## 2023-08-22 ENCOUNTER — Other Ambulatory Visit: Payer: Self-pay | Admitting: Family Medicine

## 2023-08-22 DIAGNOSIS — J449 Chronic obstructive pulmonary disease, unspecified: Secondary | ICD-10-CM

## 2023-08-23 NOTE — Telephone Encounter (Signed)
Sent patient mychart message

## 2023-08-24 LAB — CULTURE, BLOOD (ROUTINE X 2)
Culture: NO GROWTH
Culture: NO GROWTH

## 2023-08-26 ENCOUNTER — Other Ambulatory Visit: Payer: Self-pay | Admitting: Oral Surgery

## 2023-08-26 DIAGNOSIS — R6889 Other general symptoms and signs: Secondary | ICD-10-CM | POA: Diagnosis not present

## 2023-08-26 DIAGNOSIS — M2669 Other specified disorders of temporomandibular joint: Secondary | ICD-10-CM
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
## 2023-08-27 DIAGNOSIS — H401134 Primary open-angle glaucoma, bilateral, indeterminate stage: Secondary | ICD-10-CM | POA: Diagnosis not present

## 2023-08-27 DIAGNOSIS — H16143 Punctate keratitis, bilateral: Secondary | ICD-10-CM | POA: Diagnosis not present

## 2023-08-27 DIAGNOSIS — H2513 Age-related nuclear cataract, bilateral: Secondary | ICD-10-CM | POA: Diagnosis not present

## 2023-08-27 NOTE — Progress Notes (Addendum)
COVID Vaccine Completed:  Date of COVID positive in last 90 days: 07-28-23, results in Epic  PCP - Quita Skye, PA-C Cardiologist - Truett Mainland, MD Neurologist - Patrcia Dolly, MD  Chest x-ray - 08-19-23 Epic EKG - 08-20-23 Epic Stress Test - 05-28-23 Epic ECHO - 06-01-23 Epic Cardiac Cath - Yes Pacemaker/ICD device last checked: Spinal Cord Stimulator:  N/A Long Term Monitor - 05-29-22 Epic Coronary CT - 10-22-20 Epic  Bowel Prep - N/A  Sleep Study - Yes, +sleep apnea CPAP - Yes  Freestyle Libre on L arm Fasting Blood Sugar - 100 to 140 Checks Blood Sugar - multiple times a day  Last dose of GLP1 agonist-  N/A GLP1 instructions:  N/A   Last dose of SGLT-2 inhibitors-  N/A SGLT-2 instructions: N/A  Blood Thinner Instructions: Plavix.  Okay to hold x5 days.  Last dose a month ago Aspirin Instructions: Last Dose:  Activity level:  Can go up a flight of stairs and perform activities of daily living without stopping and without symptoms of chest pain.  Patient states that he does have some shortness of breath with exertion at times.   Anesthesia review: Chest pain evaluated by cardiology.   Seizure disorder (last seizure 3 years ago), CKD, HTN, DM, hx of CVA 2019 with some balance issues, brain fog, R side hand weakness  Covid 07/28/23, seen in ER on 08-19-23 for cough.  Patient states that he has a chronic cough,  but cough since Covid is much improved since ER visit  Patient denies shortness of breath, fever, cough and chest pain at PAT appointment  Patient verbalized understanding of instructions that were given to them at the PAT appointment. Patient was also instructed that they will need to review over the PAT instructions again at home before surgery.

## 2023-08-27 NOTE — Patient Instructions (Addendum)
SURGICAL WAITING ROOM VISITATION Patients having surgery or a procedure may have no more than 2 support people in the waiting area - these visitors may rotate.    Children under the age of 9 must have an adult with them who is not the patient.  If the patient needs to stay at the hospital during part of their recovery, the visitor guidelines for inpatient rooms apply. Pre-op nurse will coordinate an appropriate time for 1 support person to accompany patient in pre-op.  This support person may not rotate.    Please refer to the [facility] website for the visitor guidelines for Inpatients (after your surgery is over and you are in a regular room).       Your procedure is scheduled on: 09-03-23   Report to [facility] Main Entrance    Report to admitting at 11:15 AM   Call this number if you have problems the morning of surgery [phone]   Do not eat food :After Midnight.   After Midnight you may have the following liquids until 10:30 AM DAY OF SURGERY  Water Non-Citrus Juices (without pulp, NO RED-Apple, White grape, White cranberry) Black Coffee (NO MILK/CREAM OR CREAMERS, sugar ok)  Clear Tea (NO MILK/CREAM OR CREAMERS, sugar ok) regular and decaf                             Plain Jell-O (NO RED)                                           Fruit ices (not with fruit pulp, NO RED)                                     Popsicles (NO RED)                                                               Sports drinks like Gatorade (NO RED)                   The day of surgery:  Drink ONE (1) Pre-Surgery G2 by 10:30 AM the morning of surgery. Drink in one sitting. Do not sip.  This drink was given to you during your hospital  pre-op appointment visit. Nothing else to drink after completing the Pre-Surgery G2.          If you have questions, please contact your surgeon's office.   FOLLOW  ANY ADDITIONAL PRE OP INSTRUCTIONS YOU RECEIVED FROM YOUR SURGEON'S OFFICE!!!      Oral Hygiene is also important to reduce your risk of infection.                                    Remember - BRUSH YOUR TEETH THE MORNING OF SURGERY WITH YOUR REGULAR TOOTHPASTE   Do NOT smoke after Midnight   Take these medicines the morning of surgery with A SIP OF WATER:   Fenofibrate  Gabapentin  Zonisamide  Okay  to use inhalers, eydrops  If needed Maxalt, Tylenol, Oxycodone, Ondansetron  Stop all vitamins and herbal supplements 7 days before surgery  How to Manage Your Diabetes Before and After Surgery  Why is it important to control my blood sugar before and after surgery? Improving blood sugar levels before and after surgery helps healing and can limit problems. A way of improving blood sugar control is eating a healthy diet by:  Eating less sugar and carbohydrates  Increasing activity/exercise  Talking with your doctor about reaching your blood sugar goals High blood sugars (greater than 180 mg/dL) can raise your risk of infections and slow your recovery, so you will need to focus on controlling your diabetes during the weeks before surgery. Make sure that the doctor who takes care of your diabetes knows about your planned surgery including the date and location.  How do I manage my blood sugar before surgery? Check your blood sugar at least 4 times a day, starting 2 days before surgery, to make sure that the level is not too high or low. Check your blood sugar the morning of your surgery when you wake up and every 2 hours until you get to the Short Stay unit. If your blood sugar is less than 70 mg/dL, you will need to treat for low blood sugar: Do not take insulin. Treat a low blood sugar (less than 70 mg/dL) with  cup of clear juice (cranberry or apple), 4 glucose tablets, OR glucose gel. Recheck blood sugar in 15 minutes after treatment (to make sure it is greater than 70 mg/dL). If your blood sugar is not greater than 70 mg/dL on recheck, call [phone] for further  instructions. Report your blood sugar to the short stay nurse when you get to Short Stay.  If you are admitted to the hospital after surgery: Your blood sugar will be checked by the staff and you will probably be given insulin after surgery (instead of oral diabetes medicines) to make sure you have good blood sugar levels. The goal for blood sugar control after surgery is 80-180 mg/dL.   WHAT DO I DO ABOUT MY DIABETES MEDICATION?  Do not take oral diabetes medicines (pills) the morning of surgery.  THE NIGHT BEFORE SURGERY:  Take 17  units of Toujeo at bedtime.      THE MORNING OF SURGERYL Take 50% of Novolog insulin if CBG 220 or higher.  DO NOT TAKE THE FOLLOWING 7 DAYS PRIOR TO SURGERY: Ozempic, Wegovy, Rybelsus (Semaglutide), Byetta (exenatide), Bydureon (exenatide ER), Victoza, Saxenda (liraglutide), or Trulicity (dulaglutide) Mounjaro (Tirzepatide) Adlyxin (Lixisenatide), Polyethylene Glycol Loxenatide.  Reviewed and Endorsed by [facility] Patient Education Committee, August 2015  Bring CPAP mask and tubing day of surgery.                              You may not have any metal on your body including  jewelry, and body piercing             Do not wear lotions, powders, cologne, or deodorant              Men may shave face and neck.   Do not bring valuables to the hospital. [facility] IS NOT RESPONSIBLE   FOR VALUABLES.   Contacts, dentures or bridgework may not be worn into surgery.   Bring small overnight bag day of surgery.   DO NOT BRING YOUR HOME MEDICATIONS TO THE HOSPITAL. PHARMACY  WILL DISPENSE MEDICATIONS LISTED ON YOUR MEDICATION LIST TO YOU DURING YOUR ADMISSION IN THE HOSPITAL!               Please read over the following fact sheets you were given: IF YOU HAVE QUESTIONS ABOUT YOUR PRE-OP INSTRUCTIONS PLEASE CALL [phone] Gwen  If you received a COVID test during your pre-op visit  it is requested that you wear a mask when out in public, stay away from  anyone that may not be feeling well and notify your surgeon if you develop symptoms. If you test positive for Covid or have been in contact with anyone that has tested positive in the last 10 days please notify you surgeon.  [facility] - Preparing for Surgery Before surgery, you can play an important role.  Because skin is not sterile, your skin needs to be as free of germs as possible.  You can reduce the number of germs on your skin by washing with CHG (chlorahexidine gluconate) soap before surgery.  CHG is an antiseptic cleaner which kills germs and bonds with the skin to continue killing germs even after washing. Please DO NOT use if you have an allergy to CHG or antibacterial soaps.  If your skin becomes reddened/irritated stop using the CHG and inform your nurse when you arrive at Short Stay. Do not shave (including legs and underarms) for at least 48 hours prior to the first CHG shower.  You may shave your face/neck.  Please follow these instructions carefully:  1.  Shower with CHG Soap the night before surgery and the  morning of surgery.  2.  If you choose to wash your hair, wash your hair first as usual with your normal  shampoo.  3.  After you shampoo, rinse your hair and body thoroughly to remove the shampoo.                             4.  Use CHG as you would any other liquid soap.  You can apply chg directly to the skin and wash.  Gently with a scrungie or clean washcloth.  5.  Apply the CHG Soap to your body ONLY FROM THE NECK DOWN.   Do   not use on face/ open                           Wound or open sores. Avoid contact with eyes, ears mouth and   genitals (private parts).                       Wash face,  Genitals (private parts) with your normal soap.             6.  Wash thoroughly, paying special attention to the area where your    surgery  will be performed.  7.  Thoroughly rinse your body with warm water from the neck down.  8.  DO NOT shower/wash with your normal soap after  using and rinsing off the CHG Soap.                9.  Pat yourself dry with a clean towel.            10.  Wear clean pajamas.            11.  Place clean sheets on your bed the night of your first shower and  do not  sleep with pets. Day of Surgery : Do not apply any lotions/deodorants the morning of surgery.  Please wear clean clothes to the hospital/surgery center.  FAILURE TO FOLLOW THESE INSTRUCTIONS MAY RESULT IN THE CANCELLATION OF YOUR SURGERY  PATIENT SIGNATURE_________________________________  NURSE SIGNATURE__________________________________  ________________________________________________________________________    Nicholas [name]  An incentive spirometer is a tool that can help keep your lungs clear and active. This tool measures how well you are filling your lungs with each breath. Taking long deep breaths may help reverse or decrease the chance of developing breathing (pulmonary) problems (especially infection) following: A long period of time when you are unable to move or be active. BEFORE THE PROCEDURE  If the spirometer includes an indicator to show your best effort, your nurse or respiratory therapist will set it to a desired goal. If possible, sit up straight or lean slightly forward. Try not to slouch. Hold the incentive spirometer in an upright position. INSTRUCTIONS FOR USE  Sit on the edge of your bed if possible, or sit up as far as you can in bed or on a chair. Hold the incentive spirometer in an upright position. Breathe out normally. Place the mouthpiece in your mouth and seal your lips tightly around it. Breathe in slowly and as deeply as possible, raising the piston or the ball toward the top of the column. Hold your breath for 3-5 seconds or for as long as possible. Allow the piston or ball to fall to the bottom of the column. Remove the mouthpiece from your mouth and breathe out normally. Rest for a few seconds and repeat Steps 1 through 7 at  least 10 times every 1-2 hours when you are awake. Take your time and take a few normal breaths between deep breaths. The spirometer may include an indicator to show your best effort. Use the indicator as a goal to work toward during each repetition. After each set of 10 deep breaths, practice coughing to be sure your lungs are clear. If you have an incision (the cut made at the time of surgery), support your incision when coughing by placing a pillow or rolled up towels firmly against it. Once you are able to get out of bed, walk around indoors and cough well. You may stop using the incentive spirometer when instructed by your caregiver.  RISKS AND COMPLICATIONS Take your time so you do not get dizzy or light-headed. If you are in pain, you may need to take or ask for pain medication before doing incentive spirometry. It is harder to take a deep breath if you are having pain. AFTER USE Rest and breathe slowly and easily. It can be helpful to keep track of a log of your progress. Your caregiver can provide you with a simple table to help with this. If you are using the spirometer at home, follow these instructions: SEEK MEDICAL CARE IF:  You are having difficultly using the spirometer. You have trouble using the spirometer as often as instructed. Your pain medication is not giving enough relief while using the spirometer. You develop fever of 100.5 F (38.1 C) or higher. SEEK IMMEDIATE MEDICAL CARE IF:  You cough up bloody sputum that had not been present before. You develop fever of 102 F (38.9 C) or greater. You develop worsening pain at or near the incision site. MAKE SURE YOU:  Understand these instructions. Will watch your condition. Will get help right away if you are not doing well or get worse. Document  Released: 04/05/2007 Document Revised: 02/15/2012 Document Reviewed: 06/06/2007 [facility] Patient Information 2014 [place],  [place].   ________________________________________________________________________

## 2023-08-30 ENCOUNTER — Other Ambulatory Visit: Payer: Self-pay

## 2023-08-30 ENCOUNTER — Encounter (HOSPITAL_COMMUNITY): Payer: Self-pay

## 2023-08-30 ENCOUNTER — Encounter (HOSPITAL_COMMUNITY)
Admission: RE | Admit: 2023-08-30 | Discharge: 2023-08-30 | Disposition: A | Discharge status: Home / Self Care | Payer: Medicare HMO | Source: Ambulatory Visit | Attending: Orthopedic Surgery | Admitting: Orthopedic Surgery

## 2023-08-30 VITALS — BP 112/82 | HR 76 | Temp 97.9°F | Resp 16 | Ht 67.0 in | Wt 187.0 lb

## 2023-08-30 DIAGNOSIS — I129 Hypertensive chronic kidney disease with stage 1 through stage 4 chronic kidney disease, or unspecified chronic kidney disease: Secondary | ICD-10-CM | POA: Insufficient documentation

## 2023-08-30 DIAGNOSIS — K219 Gastro-esophageal reflux disease without esophagitis: Secondary | ICD-10-CM | POA: Insufficient documentation

## 2023-08-30 DIAGNOSIS — Z01812 Encounter for preprocedural laboratory examination: Secondary | ICD-10-CM | POA: Insufficient documentation

## 2023-08-30 DIAGNOSIS — Z87891 Personal history of nicotine dependence: Secondary | ICD-10-CM | POA: Diagnosis not present

## 2023-08-30 DIAGNOSIS — Z794 Long term (current) use of insulin: Secondary | ICD-10-CM | POA: Diagnosis not present

## 2023-08-30 DIAGNOSIS — N189 Chronic kidney disease, unspecified: Secondary | ICD-10-CM | POA: Diagnosis not present

## 2023-08-30 DIAGNOSIS — E0590 Thyrotoxicosis, unspecified without thyrotoxic crisis or storm: Secondary | ICD-10-CM | POA: Diagnosis not present

## 2023-08-30 DIAGNOSIS — E1122 Type 2 diabetes mellitus with diabetic chronic kidney disease: Secondary | ICD-10-CM | POA: Insufficient documentation

## 2023-08-30 DIAGNOSIS — E119 Type 2 diabetes mellitus without complications: Secondary | ICD-10-CM

## 2023-08-30 DIAGNOSIS — G4733 Obstructive sleep apnea (adult) (pediatric): Secondary | ICD-10-CM | POA: Insufficient documentation

## 2023-08-30 DIAGNOSIS — J45909 Unspecified asthma, uncomplicated: Secondary | ICD-10-CM | POA: Insufficient documentation

## 2023-08-30 DIAGNOSIS — Z8673 Personal history of transient ischemic attack (TIA), and cerebral infarction without residual deficits: Secondary | ICD-10-CM | POA: Insufficient documentation

## 2023-08-30 HISTORY — DX: Dyspnea, unspecified: R06.00

## 2023-08-30 HISTORY — DX: Unspecified osteoarthritis, unspecified site: M19.90

## 2023-08-30 HISTORY — DX: Sleep apnea, unspecified: G47.30

## 2023-08-30 HISTORY — DX: Unspecified glaucoma: H40.9

## 2023-08-30 HISTORY — DX: Other complications of anesthesia, initial encounter: T88.59XA

## 2023-08-30 LAB — GLUCOSE, CAPILLARY: Glucose-Capillary: 73 mg/dL (ref 70–99)

## 2023-08-30 LAB — HEMOGLOBIN A1C
Hgb A1c MFr Bld: 7.2 % — ABNORMAL HIGH (ref 4.8–5.6)
Mean Plasma Glucose: 159.94 mg/dL

## 2023-08-31 ENCOUNTER — Encounter: Payer: Self-pay | Admitting: Oral Surgery

## 2023-08-31 NOTE — Progress Notes (Signed)
Anesthesia Review:  Nicholas [name] is a 62 yo male who presents to PAT prior to right SHOULDER ARTHROSCOPY WITH DISTAL CLAVICLE RESECTION, SUBACROMIAL DECOMPRESSION, CAPSULAR RELEASE on 09/03/23 with Dr. Aundria Rud. 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  Complication from anesthesia includes: prolonged emergence.  Difficult airway noted during surgery on 04/11/2015 for cervical fusion ([facility]). Most recent note on 11/08/2021 states airway was not difficult. During 07/28/2016 surgery Was admitted to the hospital after having a seizure vs pseudoseizure post-op ([facility]) During 02/19/2020 surgery "Pt required sugammadex enhancement of MR reversal. After extubation still had somewhat shallow/inconsistent breaths, concern for CO2 accumulation. Bag/mask vent w oral airway in place in OR, pt not responsive to jaw thrust and calling of name. Improved over 5-10 mins, pt spitting out airway and responding to name. Transport to PACU w O2 3 LNC, for BiPap and ABG " ([facility])  Patient was evaluated by Cardiology on 05/26/23 for chest pain at rest and with exertion. Felt to be atypical chest pain. CTA of coronaries was normal in 2021. Updated stress test and echo were recommended which were done in June 2024. Stress test was low risk but sub optimal test due to HR not getting to target range. Echo showed normal EF 64%, mild LVH, grade I DD, and mild MR. Cleared for surgery:  "Chest pain: CCTA with no CAD< calcium score 0 (10/2020) Unlikely cardiac in origin. Will check exercise treadmill stress test and echocardiogram. 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 Last seen on 08/13/23. Per Dr. 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 Restart Zonisamide 200mg  BID for seizure and migraine prophylaxis. Continue monitor kidney function back on Zonisamide, may need to reduce dose if GFR worsens" Advised f/u in 4 months.  Patient presented to the ED on 07/28/23 for chest tightness and SOB. COVID test was positive. He was treated for asthma exacerbation and discharged. He had another ED visit on 08/19/23 for ongoing symptoms. CTA chest was done to r/o pneumonia or PE and this was negative. He was given IVF and felt better and was discharged with supportive care. At PAT visit he reports chronic cough but feels recovered from COVID illness. Since it has been >4 weeks since diagnosis (on 8/21) and he is back to his baseline, anticipate patient can proceed.  VS: BP 112/82   Pulse 76   Temp 36.6 C (Oral)   Resp 16   Ht 5\' 7"  (1.702 m)   Wt 84.8 kg   SpO2 98%   BMI 29.29 kg/m   PROVIDERS: PCP - Quita Skye, PA-C Cardiologist - Truett Mainland, MD Neurologist - Patrcia Dolly, MD   LABS: Labs reviewed: Acceptable for surgery. Kidney function at baseline (SCr 1.3) (all labs ordered are listed, but only abnormal results are displayed)  Labs Reviewed  HEMOGLOBIN A1C - Abnormal; Notable for the following components:      Result Value   Hgb A1c MFr Bld 7.2 (*)    All other components within normal limits  GLUCOSE, CAPILLARY     IMAGES:  CTA Chest 08/19/23:  IMPRESSION: 1. No evidence of pulmonary embolism. 2. AP luminal narrowing of the trachea,  which can be seen in the setting of tracheomalacia.     EKG 07/28/2023:  Normal sinus rhythm, rate 90 Left axis deviation Possible Anterior infarct , age undetermined   CV:  Echo 06/01/23:  Normal LV systolic function with EF 64%. Mild concentric hypertrophy of  the left ventricle. Normal global wall motion. Left ventricle cavity is  normal in  size. Doppler evidence of grade I (impaired) diastolic  dysfunction, normal LAP.  Mild (Grade I) mitral regurgitation.  No evidence of pulmonary hypertension.  Compared to previous study in 2021, LVEF has improved from 45-50%.   Exercise Stress test 05/28/23:  Exercise treadmill stress test performed using Bruce protocol.  Patient exercised for a total of 6 minutes and 0 seconds, achieving 7.3 METS, and 70% of age predicted maximum heart rate.  Exercise capacity was fair.  6/10, non-limiting chest pain reported.  Attenuated heart rate response, possibly due to medication (verapamil). Normal blood pressure response. Stress EKG at 70% MPHR revealed no ischemic changes. Inconclusive exercise treadmill stress test due suboptimal heart rate response.   Mobile cardiac telemetry 9 days 05/08/2022 - 05/17/2022: Dominant rhythm: Sinus. HR 44-130 bpm. Avg HR 91 bpm. 1 episode of atrial tachycardia, fastest at 171 bpm for 5 beats. <1% isolated SVE, couplet/triplets. 0 episodes of VT. <1% isolated VE. No couplet/triplets. No atrial fibrillation/atrial flutter/VT/high grade AV block, sinus pause >3sec noted. 0 patient triggered events.    CCTA 10/2020: 1. Coronary calcium score of 0. 2. Normal coronary origin with right dominance. 3. Main pulmonary artery is at the upper limit of normal, 29mm. 4. CAD-RADS = 0.   RECOMMENDATIONS: No evidence of CAD (0%). Consider non-atherosclerotic causes of chest pain.     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44---  LAST SEIZURE 1 WK AGO (APPROX ,  10-31-2013)   NO NEUROLOGIST---  PT SEES PCP  DR Lindajo Royal   Feeling of incomplete bladder emptying    Frequency of urination    Gastric ulcer    GERD (gastroesophageal reflux disease)    Glaucoma     Hypertension    Hyperthyroidism    NO MEDS    Migraine    Seizures (HCC)    Sleep apnea    Stroke (HCC)    TIA (transient ischemic attack)    Type 2 diabetes mellitus (HCC)     Past Surgical History:  Procedure Laterality Date   ABDOMINAL SURGERY     ANTERIOR CERVICAL DECOMP/DISCECTOMY FUSION  2007   C4  --  C6   CARDIAC CATHETERIZATION     CERVICAL FUSION     CHOLECYSTECTOMY     COLONOSCOPY WITH PROPOFOL Left 11/02/2020   Procedure: COLONOSCOPY WITH PROPOFOL;  Surgeon: Willis Modena, MD;  Location[facility] ENDOSCOPY;  Service: Endoscopy;  Laterality: Left;   CYSTOSCOPY N/A 11/09/2013   Procedure: CYSTOSCOPY FLEXIBLE;  Surgeon: Bjorn Pippin, MD;  Location[facility];  Service: Urology;  Laterality: N/A;   EPIDIDYMECTOMY Left 11/09/2013   Procedure:  LEFT EPIDIDYMECTOMY;  Surgeon: Bjorn Pippin, MD;  Location: [facility];  Service: Urology;  Laterality: Left;  POSSIBLE OUTPATIENT WITH OBSERVATION   EXCISION LIPOMA LEFT SHOULDER  2004 (APPROX)   MULTIPLE CYST REMOVED FROM CHEST  AGE 20   OTHER SURGICAL HISTORY     hemorroid surgery    TESTICLE REMOVAL Left    TONSILLECTOMY      MEDICATIONS:  acetaminophen (TYLENOL) 325 MG tablet   albuterol (PROVENTIL HFA;VENTOLIN HFA) 108 (90 Base) MCG/ACT inhaler   ammonium lactate (LAC-HYDRIN) 12 % lotion   atorvastatin (LIPITOR) 80 MG tablet   brimonidine (ALPHAGAN) 0.2 % ophthalmic solution   budesonide-formoterol (SYMBICORT) 80-4.5 MCG/ACT inhaler   clopidogrel (PLAVIX) 75 MG tablet   cyclobenzaprine (FLEXERIL) 5 MG tablet   diclofenac Sodium (VOLTAREN ARTHRITIS PAIN) 1 % GEL   Eszopiclone 3 MG TABS   fenofibrate (TRICOR) 145 MG tablet   gabapentin (NEURONTIN) 600 MG tablet   insulin aspart (NOVOLOG) 100 UNIT/ML injection   lactulose (CHRONULAC) 10 GM/15ML solution   latanoprost (XALATAN) 0.005 % ophthalmic solution   lidocaine (HM LIDOCAINE PATCH) 4 %   lurasidone (LATUDA) 40 MG TABS tablet    metFORMIN (GLUCOPHAGE) 1000 MG tablet   methocarbamol (ROBAXIN-750) 750 MG tablet   mupirocin ointment (BACTROBAN) 2 %   nortriptyline (PAMELOR) 50 MG capsule   ondansetron (ZOFRAN-ODT) 4 MG disintegrating tablet   oxyCODONE-acetaminophen (PERCOCET) 10-325 MG tablet   rizatriptan (MAXALT-MLT) 10 MG disintegrating tablet   TOUJEO MAX SOLOSTAR 300 UNIT/ML Solostar Pen   traZODone (DESYREL) 100 MG tablet   verapamil (VERELAN) 100 MG 24 hr capsule   zonisamide (ZONEGRAN) 100 MG capsule   No current facility-administered medications for this encounter.   Marcille Blanco [facility] Surgical Short Stay/Anesthesiology [facility] Phone [phone] 08/31/2023 2:10 PM

## 2023-08-31 NOTE — Anesthesia Preprocedure Evaluation (Addendum)
Anesthesia Evaluation  Patient identified by MRN, date of birth, ID band Patient awake    Reviewed: Allergy & Precautions, H&P , NPO status , Patient's Chart, lab work & pertinent test results  Airway Mallampati: III  TM Distance: >3 FB Neck ROM: Full    Dental no notable dental hx. (+) Teeth Intact, Dental Advisory Given   Pulmonary asthma , sleep apnea , former smoker   Pulmonary exam normal breath sounds clear to auscultation       Cardiovascular hypertension, Pt. on medications  Rhythm:Regular Rate:Normal     Neuro/Psych  Headaches    Bipolar Disorder   TIA   GI/Hepatic Neg liver ROS, PUD,GERD  ,,  Endo/Other  diabetes, Type 2, Insulin Dependent, Oral Hypoglycemic Agents Hyperthyroidism   Renal/GU negative Renal ROS  negative genitourinary   Musculoskeletal  (+) Arthritis , Osteoarthritis,    Abdominal   Peds  Hematology  (+) Blood dyscrasia, anemia   Anesthesia Other Findings   Reproductive/Obstetrics negative OB ROS                             Anesthesia Physical Anesthesia Plan  ASA: 3  Anesthesia Plan: General   Post-op Pain Management: Regional block* and Tylenol PO (pre-op)*   Induction: Intravenous  PONV Risk Score and Plan: 3 and Ondansetron, Dexamethasone and Midazolam  Airway Management Planned: Oral ETT  Additional Equipment:   Intra-op Plan:   Post-operative Plan: Extubation in OR  Informed Consent: I have reviewed the patients History and Physical, chart, labs and discussed the procedure including the risks, benefits and alternatives for the proposed anesthesia with the patient or authorized representative who has indicated his/her understanding and acceptance.     Dental advisory given  Plan Discussed with: CRNA  Anesthesia Plan Comments: (See PAT note from 9/23 by Sherlie Ban PA-C )        Anesthesia Quick Evaluation

## 2023-09-03 ENCOUNTER — Encounter (HOSPITAL_COMMUNITY): Payer: Self-pay | Admitting: Orthopedic Surgery

## 2023-09-03 ENCOUNTER — Observation Stay (HOSPITAL_COMMUNITY)
Admission: RE | Admit: 2023-09-03 | Discharge: 2023-09-04 | Disposition: A | Discharge status: Home / Self Care | Payer: Medicare HMO | Source: Ambulatory Visit | Attending: Orthopedic Surgery | Admitting: Orthopedic Surgery

## 2023-09-03 ENCOUNTER — Other Ambulatory Visit: Payer: Self-pay

## 2023-09-03 ENCOUNTER — Ambulatory Visit (HOSPITAL_COMMUNITY): Payer: Medicare HMO | Admitting: Medical

## 2023-09-03 ENCOUNTER — Ambulatory Visit (HOSPITAL_COMMUNITY): Payer: Medicare HMO

## 2023-09-03 ENCOUNTER — Ambulatory Visit (HOSPITAL_COMMUNITY): Payer: Medicare HMO | Admitting: Anesthesiology

## 2023-09-03 ENCOUNTER — Encounter (HOSPITAL_COMMUNITY)
Admission: RE | Disposition: A | Discharge status: Home / Self Care | Payer: Self-pay | Source: Ambulatory Visit | Attending: Orthopedic Surgery

## 2023-09-03 DIAGNOSIS — M7501 Adhesive capsulitis of right shoulder: Secondary | ICD-10-CM | POA: Diagnosis not present

## 2023-09-03 DIAGNOSIS — M7551 Bursitis of right shoulder: Secondary | ICD-10-CM | POA: Diagnosis not present

## 2023-09-03 DIAGNOSIS — M7541 Impingement syndrome of right shoulder: Secondary | ICD-10-CM | POA: Diagnosis not present

## 2023-09-03 DIAGNOSIS — M19011 Primary osteoarthritis, right shoulder: Secondary | ICD-10-CM | POA: Insufficient documentation

## 2023-09-03 DIAGNOSIS — N1831 Chronic kidney disease, stage 3a: Secondary | ICD-10-CM | POA: Diagnosis not present

## 2023-09-03 DIAGNOSIS — Z8673 Personal history of transient ischemic attack (TIA), and cerebral infarction without residual deficits: Secondary | ICD-10-CM | POA: Insufficient documentation

## 2023-09-03 DIAGNOSIS — Z87891 Personal history of nicotine dependence: Secondary | ICD-10-CM | POA: Insufficient documentation

## 2023-09-03 DIAGNOSIS — E1122 Type 2 diabetes mellitus with diabetic chronic kidney disease: Secondary | ICD-10-CM | POA: Diagnosis not present

## 2023-09-03 DIAGNOSIS — G8918 Other acute postprocedural pain: Secondary | ICD-10-CM | POA: Diagnosis not present

## 2023-09-03 DIAGNOSIS — I10 Essential (primary) hypertension: Secondary | ICD-10-CM

## 2023-09-03 DIAGNOSIS — E119 Type 2 diabetes mellitus without complications: Principal | ICD-10-CM

## 2023-09-03 DIAGNOSIS — S43431A Superior glenoid labrum lesion of right shoulder, initial encounter: Secondary | ICD-10-CM | POA: Diagnosis not present

## 2023-09-03 DIAGNOSIS — M24111 Other articular cartilage disorders, right shoulder: Secondary | ICD-10-CM | POA: Diagnosis not present

## 2023-09-03 DIAGNOSIS — S46191A Other injury of muscle, fascia and tendon of long head of biceps, right arm, initial encounter: Secondary | ICD-10-CM | POA: Diagnosis not present

## 2023-09-03 DIAGNOSIS — M25611 Stiffness of right shoulder, not elsewhere classified: Secondary | ICD-10-CM | POA: Diagnosis not present

## 2023-09-03 DIAGNOSIS — Z79899 Other long term (current) drug therapy: Secondary | ICD-10-CM | POA: Diagnosis not present

## 2023-09-03 DIAGNOSIS — Z7984 Long term (current) use of oral hypoglycemic drugs: Secondary | ICD-10-CM | POA: Diagnosis not present

## 2023-09-03 DIAGNOSIS — M75121 Complete rotator cuff tear or rupture of right shoulder, not specified as traumatic: Secondary | ICD-10-CM | POA: Diagnosis present

## 2023-09-03 DIAGNOSIS — J45909 Unspecified asthma, uncomplicated: Secondary | ICD-10-CM | POA: Insufficient documentation

## 2023-09-03 DIAGNOSIS — I129 Hypertensive chronic kidney disease with stage 1 through stage 4 chronic kidney disease, or unspecified chronic kidney disease: Secondary | ICD-10-CM | POA: Diagnosis not present

## 2023-09-03 HISTORY — PX: SHOULDER ARTHROSCOPY WITH DISTAL CLAVICLE RESECTION: SHX5675

## 2023-09-03 LAB — CBC
HCT: 35.7 % — ABNORMAL LOW (ref 39.0–52.0)
Hemoglobin: 11.5 g/dL — ABNORMAL LOW (ref 13.0–17.0)
MCH: 27.6 pg (ref 26.0–34.0)
MCHC: 32.2 g/dL (ref 30.0–36.0)
MCV: 85.8 fL (ref 80.0–100.0)
Platelets: 142 10*3/uL — ABNORMAL LOW (ref 150–400)
RBC: 4.16 MIL/uL — ABNORMAL LOW (ref 4.22–5.81)
RDW: 15.4 % (ref 11.5–15.5)
WBC: 5.7 10*3/uL (ref 4.0–10.5)
nRBC: 0 % (ref 0.0–0.2)

## 2023-09-03 LAB — GLUCOSE, CAPILLARY
Glucose-Capillary: 145 mg/dL — ABNORMAL HIGH (ref 70–99)
Glucose-Capillary: 119 mg/dL — ABNORMAL HIGH (ref 70–99)
Glucose-Capillary: 110 mg/dL — ABNORMAL HIGH (ref 70–99)
Glucose-Capillary: 105 mg/dL — ABNORMAL HIGH (ref 70–99)
Glucose-Capillary: 142 mg/dL — ABNORMAL HIGH (ref 70–99)

## 2023-09-03 LAB — CREATININE, SERUM
Creatinine, Ser: 1.02 mg/dL (ref 0.61–1.24)
GFR, Estimated: >60 mL/min (ref >60)

## 2023-09-03 SURGERY — SHOULDER ARTHROSCOPY WITH DISTAL CLAVICLE RESECTION
Anesthesia: General | Site: Shoulder | Laterality: Right

## 2023-09-03 MED ORDER — PROPOFOL 10 MG/ML IV BOLUS
INTRAVENOUS | Status: AC
  Filled 2023-09-03: qty 20

## 2023-09-03 MED ORDER — ONDANSETRON HCL 4 MG/2ML IJ SOLN
4.0000 mg | Freq: Four times a day (QID) | INTRAMUSCULAR | Status: DC | PRN
  Administered 2023-09-03: 4 mg via INTRAVENOUS
  Filled 2023-09-03: qty 2

## 2023-09-03 MED ORDER — ACETAMINOPHEN 500 MG PO TABS
1000.0000 mg | ORAL_TABLET | Freq: Once | ORAL | Status: AC
  Administered 2023-09-03: 1000 mg via ORAL
  Filled 2023-09-03: qty 2

## 2023-09-03 MED ORDER — VERAPAMIL HCL ER 100 MG PO CP24: 100.0000 mg | ORAL_CAPSULE | Freq: Every day | ORAL | Status: DC

## 2023-09-03 MED ORDER — NORTRIPTYLINE HCL 25 MG PO CAPS: 100.0000 mg | ORAL_CAPSULE | Freq: Every day | ORAL | Status: DC | PRN

## 2023-09-03 MED ORDER — CEFAZOLIN SODIUM-DEXTROSE 2-4 GM/100ML-% IV SOLN
2.0000 g | INTRAVENOUS | Status: AC
  Administered 2023-09-03: 2 g via INTRAVENOUS
  Filled 2023-09-03: qty 100

## 2023-09-03 MED ORDER — DEXAMETHASONE SODIUM PHOSPHATE 10 MG/ML IJ SOLN
INTRAMUSCULAR | Status: AC
  Filled 2023-09-03: qty 1

## 2023-09-03 MED ORDER — ZOLPIDEM TARTRATE 5 MG PO TABS: 5.0000 mg | ORAL_TABLET | Freq: Every evening | ORAL | Status: DC | PRN

## 2023-09-03 MED ORDER — CEFAZOLIN SODIUM-DEXTROSE 2-4 GM/100ML-% IV SOLN
2.0000 g | Freq: Four times a day (QID) | INTRAVENOUS | Status: AC
  Administered 2023-09-03 – 2023-09-04 (×3): 2 g via INTRAVENOUS
  Filled 2023-09-03 (×3): qty 100

## 2023-09-03 MED ORDER — INSULIN ASPART 100 UNIT/ML IJ SOLN: 0.0000 [IU] | Freq: Three times a day (TID) | INTRAMUSCULAR | Status: DC

## 2023-09-03 MED ORDER — ONDANSETRON HCL 4 MG PO TABS: 4.0000 mg | ORAL_TABLET | Freq: Four times a day (QID) | ORAL | Status: DC | PRN

## 2023-09-03 MED ORDER — METOCLOPRAMIDE HCL 5 MG PO TABS: 5.0000 mg | ORAL_TABLET | Freq: Three times a day (TID) | ORAL | Status: DC | PRN

## 2023-09-03 MED ORDER — LACTATED RINGERS IV SOLN: INTRAVENOUS | Status: DC

## 2023-09-03 MED ORDER — PHENYLEPHRINE HCL-NACL 20-0.9 MG/250ML-% IV SOLN
INTRAVENOUS | Status: DC | PRN
  Administered 2023-09-03: 20 ug/min via INTRAVENOUS

## 2023-09-03 MED ORDER — RIZATRIPTAN BENZOATE 10 MG PO TBDP: 10.0000 mg | ORAL_TABLET | ORAL | Status: DC | PRN

## 2023-09-03 MED ORDER — CHLORHEXIDINE GLUCONATE 0.12 % MT SOLN
15.0000 mL | Freq: Once | OROMUCOSAL | Status: AC
  Administered 2023-09-03: 15 mL via OROMUCOSAL

## 2023-09-03 MED ORDER — EPHEDRINE 5 MG/ML INJ
INTRAVENOUS | Status: AC
  Filled 2023-09-03: qty 5

## 2023-09-03 MED ORDER — GLYCOPYRROLATE 0.2 MG/ML IJ SOLN
INTRAMUSCULAR | Status: DC | PRN
  Administered 2023-09-03 (×2): .1 mg via INTRAVENOUS

## 2023-09-03 MED ORDER — PHENYLEPHRINE HCL (PRESSORS) 10 MG/ML IV SOLN
INTRAVENOUS | Status: DC | PRN
Start: 2023-09-03 — End: 2023-09-03
  Administered 2023-09-03: 40 ug via INTRAVENOUS
  Administered 2023-09-03 (×2): 120 ug via INTRAVENOUS
  Administered 2023-09-03: 40 ug via INTRAVENOUS

## 2023-09-03 MED ORDER — OXYCODONE HCL 5 MG PO TABS
10.0000 mg | ORAL_TABLET | ORAL | Status: DC | PRN
  Administered 2023-09-03: 10 mg via ORAL
  Filled 2023-09-03: qty 2

## 2023-09-03 MED ORDER — LACTULOSE 10 GM/15ML PO SOLN: 30.0000 g | Freq: Every day | ORAL | Status: DC | PRN

## 2023-09-03 MED ORDER — METFORMIN HCL 500 MG PO TABS
1000.0000 mg | ORAL_TABLET | Freq: Two times a day (BID) | ORAL | Status: DC
  Administered 2023-09-04: 1000 mg via ORAL
  Filled 2023-09-03: qty 2

## 2023-09-03 MED ORDER — HYDROMORPHONE HCL 1 MG/ML IJ SOLN
0.5000 mg | INTRAMUSCULAR | Status: DC | PRN
  Administered 2023-09-03: 0.5 mg via INTRAVENOUS
  Filled 2023-09-03: qty 1

## 2023-09-03 MED ORDER — ALBUTEROL SULFATE HFA 108 (90 BASE) MCG/ACT IN AERS: 1.0000 | INHALATION_SPRAY | Freq: Four times a day (QID) | RESPIRATORY_TRACT | Status: DC | PRN

## 2023-09-03 MED ORDER — OXYCODONE HCL 5 MG PO TABS
5.0000 mg | ORAL_TABLET | ORAL | 0 refills | Status: AC | PRN
Start: 2023-09-03 — End: 2024-09-02

## 2023-09-03 MED ORDER — GLYCOPYRROLATE 0.2 MG/ML IJ SOLN
INTRAMUSCULAR | Status: AC
  Filled 2023-09-03: qty 1

## 2023-09-03 MED ORDER — CLOPIDOGREL BISULFATE 75 MG PO TABS
75.0000 mg | ORAL_TABLET | Freq: Every day | ORAL | Status: DC
  Administered 2023-09-03 – 2023-09-04 (×2): 75 mg via ORAL
  Filled 2023-09-03 (×2): qty 1

## 2023-09-03 MED ORDER — HYDROMORPHONE HCL 2 MG PO TABS: 1.0000 mg | ORAL_TABLET | ORAL | Status: DC | PRN

## 2023-09-03 MED ORDER — FLUTICASONE FUROATE-VILANTEROL 100-25 MCG/ACT IN AEPB
1.0000 | INHALATION_SPRAY | Freq: Every day | RESPIRATORY_TRACT | Status: DC
  Administered 2023-09-03 – 2023-09-04 (×2): 1 via RESPIRATORY_TRACT
  Filled 2023-09-03: qty 28

## 2023-09-03 MED ORDER — ROCURONIUM BROMIDE 100 MG/10ML IV SOLN
INTRAVENOUS | Status: DC | PRN
  Administered 2023-09-03: 60 mg via INTRAVENOUS

## 2023-09-03 MED ORDER — EPHEDRINE SULFATE (PRESSORS) 50 MG/ML IJ SOLN
INTRAMUSCULAR | Status: DC | PRN
Start: 2023-09-03 — End: 2023-09-03
  Administered 2023-09-03: 10 mg via INTRAVENOUS

## 2023-09-03 MED ORDER — SUGAMMADEX SODIUM 200 MG/2ML IV SOLN
INTRAVENOUS | Status: DC | PRN
Start: 2023-09-03 — End: 2023-09-03
  Administered 2023-09-03: 400 mg via INTRAVENOUS

## 2023-09-03 MED ORDER — ORAL CARE MOUTH RINSE: 15.0000 mL | Freq: Once | OROMUCOSAL | Status: AC

## 2023-09-03 MED ORDER — BRIMONIDINE TARTRATE 0.2 % OP SOLN
1.0000 [drp] | Freq: Two times a day (BID) | OPHTHALMIC | Status: DC
  Administered 2023-09-03: 1 [drp] via OPHTHALMIC
  Filled 2023-09-03: qty 5

## 2023-09-03 MED ORDER — FENTANYL CITRATE (PF) 250 MCG/5ML IJ SOLN
INTRAMUSCULAR | Status: AC
  Filled 2023-09-03: qty 5

## 2023-09-03 MED ORDER — ONDANSETRON HCL 4 MG/2ML IJ SOLN
INTRAMUSCULAR | Status: DC | PRN
  Administered 2023-09-03: 4 mg via INTRAVENOUS

## 2023-09-03 MED ORDER — LATANOPROST 0.005 % OP SOLN
1.0000 [drp] | Freq: Every day | OPHTHALMIC | Status: DC
  Administered 2023-09-03: 1 [drp] via OPHTHALMIC
  Filled 2023-09-03: qty 2.5

## 2023-09-03 MED ORDER — ALBUTEROL SULFATE (2.5 MG/3ML) 0.083% IN NEBU: 2.5000 mg | INHALATION_SOLUTION | Freq: Four times a day (QID) | RESPIRATORY_TRACT | Status: DC | PRN

## 2023-09-03 MED ORDER — ENOXAPARIN SODIUM 40 MG/0.4ML IJ SOSY: 40.0000 mg | PREFILLED_SYRINGE | INTRAMUSCULAR | Status: DC

## 2023-09-03 MED ORDER — SODIUM CHLORIDE 0.9 % IR SOLN
Status: DC | PRN
  Administered 2023-09-03: 6000 mL

## 2023-09-03 MED ORDER — INSULIN ASPART 100 UNIT/ML IJ SOLN: 0.0000 [IU] | Freq: Every day | INTRAMUSCULAR | Status: DC

## 2023-09-03 MED ORDER — HYDROMORPHONE HCL 1 MG/ML IJ SOLN: 0.2500 mg | INTRAMUSCULAR | Status: DC | PRN

## 2023-09-03 MED ORDER — METOCLOPRAMIDE HCL 5 MG/ML IJ SOLN
5.0000 mg | Freq: Three times a day (TID) | INTRAMUSCULAR | Status: DC | PRN
  Administered 2023-09-03: 10 mg via INTRAVENOUS
  Filled 2023-09-03: qty 2

## 2023-09-03 MED ORDER — DOCUSATE SODIUM 100 MG PO CAPS
100.0000 mg | ORAL_CAPSULE | Freq: Two times a day (BID) | ORAL | Status: DC
  Administered 2023-09-03 – 2023-09-04 (×2): 100 mg via ORAL
  Filled 2023-09-03 (×2): qty 1

## 2023-09-03 MED ORDER — PROPOFOL 10 MG/ML IV BOLUS
INTRAVENOUS | Status: DC | PRN
Start: 2023-09-03 — End: 2023-09-03
  Administered 2023-09-03: 150 mg via INTRAVENOUS

## 2023-09-03 MED ORDER — METHOCARBAMOL 500 MG PO TABS: 500.0000 mg | ORAL_TABLET | Freq: Four times a day (QID) | ORAL | Status: DC | PRN

## 2023-09-03 MED ORDER — METHOCARBAMOL 1000 MG/10ML IJ SOLN: 500.0000 mg | Freq: Four times a day (QID) | INTRAVENOUS | Status: DC | PRN

## 2023-09-03 MED ORDER — ROCURONIUM BROMIDE 10 MG/ML (PF) SYRINGE
PREFILLED_SYRINGE | INTRAVENOUS | Status: AC
  Filled 2023-09-03: qty 10

## 2023-09-03 MED ORDER — MIDAZOLAM HCL 2 MG/2ML IJ SOLN
2.0000 mg | Freq: Once | INTRAMUSCULAR | Status: AC
  Administered 2023-09-03: 2 mg via INTRAVENOUS
  Filled 2023-09-03: qty 2

## 2023-09-03 MED ORDER — INSULIN ASPART 100 UNIT/ML IJ SOLN: 0.0000 [IU] | INTRAMUSCULAR | Status: DC | PRN

## 2023-09-03 MED ORDER — CEFAZOLIN SODIUM-DEXTROSE 2-3 GM-%(50ML) IV SOLR
INTRAVENOUS | Status: DC | PRN
Start: 2023-09-03 — End: 2023-09-03
  Administered 2023-09-03: 2 g via INTRAVENOUS

## 2023-09-03 MED ORDER — ONDANSETRON HCL 4 MG PO TABS: 4.0000 mg | ORAL_TABLET | Freq: Three times a day (TID) | ORAL | 0 refills | Status: AC | PRN

## 2023-09-03 MED ORDER — ACETAMINOPHEN 500 MG PO TABS
1000.0000 mg | ORAL_TABLET | Freq: Four times a day (QID) | ORAL | Status: DC
  Administered 2023-09-03 – 2023-09-04 (×3): 1000 mg via ORAL
  Filled 2023-09-03 (×4): qty 2

## 2023-09-03 MED ORDER — SODIUM CHLORIDE 0.9 % IR SOLN
Status: DC | PRN
  Administered 2023-09-03: 1 mL

## 2023-09-03 MED ORDER — ZONISAMIDE 100 MG PO CAPS
200.0000 mg | ORAL_CAPSULE | Freq: Two times a day (BID) | ORAL | Status: DC
  Administered 2023-09-03 – 2023-09-04 (×2): 200 mg via ORAL
  Filled 2023-09-03 (×2): qty 2

## 2023-09-03 MED ORDER — LURASIDONE HCL 40 MG PO TABS
40.0000 mg | ORAL_TABLET | Freq: Every day | ORAL | Status: DC
  Administered 2023-09-03: 40 mg via ORAL
  Filled 2023-09-03: qty 1

## 2023-09-03 MED ORDER — FENTANYL CITRATE PF 50 MCG/ML IJ SOSY
100.0000 ug | PREFILLED_SYRINGE | Freq: Once | INTRAMUSCULAR | Status: AC
  Administered 2023-09-03: 100 ug via INTRAVENOUS
  Filled 2023-09-03: qty 2

## 2023-09-03 MED ORDER — ONDANSETRON HCL 4 MG/2ML IJ SOLN
INTRAMUSCULAR | Status: AC
  Filled 2023-09-03: qty 2

## 2023-09-03 MED ORDER — TRAZODONE HCL 100 MG PO TABS
100.0000 mg | ORAL_TABLET | Freq: Every day | ORAL | Status: DC
  Administered 2023-09-03: 100 mg via ORAL
  Filled 2023-09-03: qty 1

## 2023-09-03 MED ORDER — EPINEPHRINE PF 1 MG/ML IJ SOLN
INTRAMUSCULAR | Status: AC
  Filled 2023-09-03: qty 1

## 2023-09-03 MED ORDER — ACETAMINOPHEN 325 MG PO TABS: 325.0000 mg | ORAL_TABLET | Freq: Four times a day (QID) | ORAL | Status: DC | PRN

## 2023-09-03 MED ORDER — VERAPAMIL HCL ER 120 MG PO TBCR
120.0000 mg | EXTENDED_RELEASE_TABLET | Freq: Every day | ORAL | Status: DC
  Administered 2023-09-03: 120 mg via ORAL
  Filled 2023-09-03: qty 1

## 2023-09-03 SURGICAL SUPPLY — 58 items
BAG COUNTER SPONGE SURGICOUNT (BAG) ×1 IMPLANT
BAG SPNG CNTER NS LX DISP (BAG) ×1
BURR OVAL 8 FLU 4.0X13 (MISCELLANEOUS) ×1 IMPLANT
CANNULA 5.75X7 CRYSTAL CLEAR (CANNULA) IMPLANT
CANNULA 5.75X71 LONG (CANNULA) IMPLANT
CANNULA TWIST IN 8.25X7CM (CANNULA) IMPLANT
CUTTER BONE 4.0MM X 13CM (MISCELLANEOUS) IMPLANT
DISSECTOR 3.8MM X 13CM (MISCELLANEOUS) IMPLANT
DRAPE ORTHO SPLIT 77X108 STRL (DRAPES) ×2
DRAPE SHEET LG 3/4 BI-LAMINATE (DRAPES) ×1 IMPLANT
DRAPE STERI 35X30 U-POUCH (DRAPES) ×1 IMPLANT
DRAPE SURG 17X23 STRL (DRAPES) ×1 IMPLANT
DRAPE SURG ORHT 6 SPLT 77X108 (DRAPES) ×2 IMPLANT
DRAPE U-SHAPE 47X51 STRL (DRAPES) ×1 IMPLANT
DURAPREP 26ML APPLICATOR (WOUND CARE) ×1 IMPLANT
ELECT REM PT RETURN 15FT ADLT (MISCELLANEOUS) IMPLANT
FIBERSTICK 2 (SUTURE) IMPLANT
GAUZE PAD ABD 8X10 STRL (GAUZE/BANDAGES/DRESSINGS) ×3 IMPLANT
GAUZE SPONGE 4X4 12PLY STRL (GAUZE/BANDAGES/DRESSINGS) ×1 IMPLANT
GLOVE BIO SURGEON STRL SZ7.5 (GLOVE) ×2 IMPLANT
GLOVE BIOGEL PI IND STRL 8 (GLOVE) ×2 IMPLANT
GOWN STRL REUS W/ TWL XL LVL3 (GOWN DISPOSABLE) ×2 IMPLANT
GOWN STRL REUS W/TWL XL LVL3 (GOWN DISPOSABLE) ×2
IV NS IRRIG 3000ML ARTHROMATIC (IV SOLUTION) ×2 IMPLANT
KIT BASIN OR (CUSTOM PROCEDURE TRAY) ×1 IMPLANT
KIT TURNOVER KIT A (KITS) IMPLANT
MANIFOLD NEPTUNE II (INSTRUMENTS) ×1 IMPLANT
NDL 1/2 CIR CATGUT .05X1.09 (NEEDLE) IMPLANT
NDL SAFETY ECLIP 18X1.5 (MISCELLANEOUS) IMPLANT
NDL SCORPION MULTI FIRE (NEEDLE) IMPLANT
NEEDLE 1/2 CIR CATGUT .05X1.09 (NEEDLE)
NEEDLE SCORPION MULTI FIRE (NEEDLE)
PACK ARTHROSCOPY WL (CUSTOM PROCEDURE TRAY) ×1 IMPLANT
PROBE BIPOLAR ATHRO 135MM 90D (MISCELLANEOUS) IMPLANT
SLEEVE ARM SUSPENSION SYSTEM (MISCELLANEOUS) ×1 IMPLANT
SLING ARM FOAM STRAP LRG (SOFTGOODS) IMPLANT
SLING S3 LATERAL DISP (MISCELLANEOUS) ×1 IMPLANT
SLING ULTRA II L (ORTHOPEDIC SUPPLIES) IMPLANT
SPONGE T-LAP 4X18 ~~LOC~~+RFID (SPONGE) IMPLANT
STRIP CLOSURE SKIN 1/2X4 (GAUZE/BANDAGES/DRESSINGS) IMPLANT
SUT 2 FIBERLOOP 20 STRT BLUE (SUTURE)
SUT FIBERWIRE #2 38 T-5 BLUE (SUTURE)
SUT MNCRL AB 3-0 PS2 18 (SUTURE) IMPLANT
SUT MNCRL AB 3-0 PS2 27 (SUTURE) ×1 IMPLANT
SUT PDS AB 0 CT1 36 (SUTURE) IMPLANT
SUT TIGER TAPE 7 IN WHITE (SUTURE) IMPLANT
SUT VIC AB 0 CT1 36 (SUTURE) IMPLANT
SUT VIC AB 2-0 CT1 27 (SUTURE)
SUT VIC AB 2-0 CT1 TAPERPNT 27 (SUTURE) IMPLANT
SUTURE 2 FIBERLOOP 20 STRT BLU (SUTURE) IMPLANT
SUTURE FIBERWR #2 38 T-5 BLUE (SUTURE) IMPLANT
SYR 27GX1/2 1ML LL SAFETY (SYRINGE) IMPLANT
TAPE CLOTH SURG 6X10 WHT LF (GAUZE/BANDAGES/DRESSINGS) ×1 IMPLANT
TOWEL OR 17X26 10 PK STRL BLUE (TOWEL DISPOSABLE) ×1 IMPLANT
TUBING ARTHROSCOPY IRRIG 16FT (MISCELLANEOUS) ×2 IMPLANT
TUBING CONNECTING 10 (TUBING) ×2 IMPLANT
WAND ABLATOR APOLLO I90 (BUR) ×1 IMPLANT
WATER STERILE IRR 500ML POUR (IV SOLUTION) ×1 IMPLANT

## 2023-09-03 NOTE — Anesthesia Procedure Notes (Signed)
Procedure Name: Intubation Date/Time: 09/03/2023 1:11 PM  Performed by: Ahmed Prima, CRNAPre-anesthesia Checklist: Patient identified, Emergency Drugs available, Suction available and Patient being monitored Patient Re-evaluated:Patient Re-evaluated prior to induction Oxygen Delivery Method: Circle system utilized Preoxygenation: Pre-oxygenation with 100% oxygen Induction Type: IV induction Ventilation: Mask ventilation without difficulty and Oral airway inserted - appropriate to patient size Laryngoscope Size: Mac, Glidescope and 3 Grade View: Grade I Tube type: Oral Tube size: 7.5 mm Number of attempts: 1 Airway Equipment and Method: Stylet, Oral airway and Video-laryngoscopy Placement Confirmation: ETT inserted through vocal cords under direct vision, positive ETCO2 and breath sounds checked- equal and bilateral Secured at: 21 (at teeth) cm Tube secured with: Tape Dental Injury: Teeth and Oropharynx as per pre-operative assessment

## 2023-09-03 NOTE — Plan of Care (Signed)

## 2023-09-03 NOTE — Transfer of Care (Signed)
Immediate Anesthesia Transfer of Care Note  Patient: Nicholas [name]  Procedure(s) Performed: SHOULDER ARTHROSCOPY WITH DISTAL CLAVICLE RESECTION, SUBACROMIAL DECOMPRESSION, EXTENSIVE DEBRIDEMENT (Right: Shoulder)  Patient Location: PACU  Anesthesia Type:General and Regional  Level of Consciousness: drowsy  Airway & Oxygen Therapy: Patient Spontanous Breathing and Patient connected to face mask oxygen  Post-op Assessment: Report given to RN and Post -op Vital signs reviewed and stable  Post vital signs: Reviewed and stable  Last Vitals:  Vitals Value Taken Time  BP 137/92 09/03/23 1424  Temp    Pulse 62 09/03/23 1429  Resp 18 09/03/23 1429  SpO2 100 % 09/03/23 1429  Vitals shown include unfiled device data.  Last Pain:  Vitals:   09/03/23 1220  TempSrc:   PainSc: 0-No pain         Complications: No notable events documented.

## 2023-09-03 NOTE — Op Note (Signed)
Date of Surgery: 09/03/2023  INDICATIONS: Nicholas [name] is a 62 y.o.-year-old male with a right shoulder symptomatic acromioclavicular joint arthritis as well as generalized pain over the last 1 year plus.  Here today for arthroscopic surgery.;  The patient did consent to the procedure after discussion of the risks and benefits.  PREOPERATIVE DIAGNOSIS:  1.  Right shoulder stiffness 2.  Right shoulder acromioclavicular arthropathy  Postoperative diagnosis: 1.  Right shoulder stiffness 2.  Right shoulder acromioclavicular arthropathy 3.  Right shoulder degenerative superior and anterior labral tearing 4.  Right shoulder long head of biceps tendon tearing 5.  Right shoulder subacromial impingement   PROCEDURE:  1.  Right shoulder arthroscopic extensive debridement of superior labrum, anterior labrum, rotator interval as well as biceps tenotomy and subacromial bursectomy. 2.  Right shoulder arthroscopic subacromial decompression 3.  Right shoulder arthroscopic distal clavicle resection  SURGEON: Maryan Rued, M.D.  ASSIST: Dion Saucier, PA-C  Assistant attestation:  PA Mcclung present for the entire procedure..  ANESTHESIA:  general, interscalene block with Exparel  IV FLUIDS AND URINE: See anesthesia.  ESTIMATED BLOOD LOSS: 20 mL.  IMPLANTS: None  DRAINS: None  COMPLICATIONS: None.  DESCRIPTION OF PROCEDURE: The patient was brought to the operating room and placed supine on the operating table.  The patient had been signed prior to the procedure and this was documented. The patient had the anesthesia placed by the anesthesiologist.  A time-out was performed to confirm that this was the correct patient, site, side and location. The patient did receive antibiotics prior to the incision and was re-dosed during the procedure as needed at indicated intervals.  A tourniquet was not placed.  The patient had the operative extremity prepped and draped in the standard surgical fashion.       After obtaining informed consent the patient was brought to the operating table and underwent satisfactory anesthesia. An exam under anesthesia revealed full range of motion. He was placed in the left lateral decubitus position with an axillary roll and all bony prominences properly padded. A standard surgical timeout was performed. He was placed in 10 pounds of gentle in-line suspension.  Standard posterior and anterior superior portals were established. A diagnostic evaluation of the glenohumeral joint was performed.  Diagnostic arthroscopy demonstrated that he had very minimal capsulitis.  There was some degenerative tearing of the superior labrum as well as anterior labrum that propagated down to about the 4 o'clock position in this right shoulder.  This was detached completely from the 11 o'clock position all the way down.  The biceps anchor to superior labrum was robust but there was some split tearing noted just along the most distal portion of the intra-articular biceps.  There was grade 2 glenohumeral chondromalacia diffusely.  Humeral head cartilage was well-preserved.  Rotator cuff tendons intact x 4.  No intra-articular loose body noted.  After establishing posterior lateral viewing portal we then established a mid glenoid working portal just in the rotator interval superior to the upper border of the subscapularis tendon.  We introduced a motorized shaver through this portal and performed extensive debridement of the intra-articular portion of the right shoulder.  This included intra labrum, superior labrum, rotator interval.  We also used the radiofrequency wand to release the long head biceps tendon.   The arthroscope was inserted in the subacromial space and an additional lateral portal was established. An acromioplasty performed nicely decompressing the subacromial space with a motorized burr.  We did identify after the bursa was  widely excised that there was in fact a type II acromion.   Also, along the medial border of the acromion at the acromioclavicular joint there was a large undersurface spur that obscured our ability to enter the [facility] joint.  This was also leveled off with a motorized bur to a flat surface.    The bursal surface of the rotator cuff tendon was intact as well.  No partial or full-thickness tears.  Very healthy looking tissue.  We then moved our attention to the distal clavicle.  While viewing from the lateral portal and working through the mid glenoid portal at the level of the [facility] joint use the radiofrequency wand to perform a subperiosteal dissection of the [facility] joint.  This demonstrated a significant and large undersurface spur as well as sclerosis and cystic change of distal clavicle.  We then resected approximately 1 cm of the distal clavicle with motorized bur.  The arthroscope was then removed and portals closed with 4-0 Monocryl in standard fashion followed by a sterile occlusive dressing Polar Care ice sleeve and a slingshot sling. The patient was sent to recovery in stable condition and tolerated the procedure well  POSTOPERATIVE PLAN:  Mr. Vonada will be weightbearing as tolerated immediately to the right upper extremity.  Be in a sling until his nerve block resolves.  Will admit him postoperatively for monitoring overnight and pain control.  Discharge home tomorrow.  Follow-up me in the office in 2 weeks.

## 2023-09-03 NOTE — Anesthesia Procedure Notes (Signed)
Anesthesia Regional Block: Interscalene brachial plexus block   Pre-Anesthetic Checklist: , timeout performed,  Correct Patient, Correct Site, Correct Laterality,  Correct Procedure, Correct Position, site marked,  Risks and benefits discussed,  Pre-op evaluation,  At surgeon's request and post-op pain management  Laterality: Right  Prep: Maximum Sterile Barrier Precautions used, chloraprep       Needles:  Injection technique: Single-shot  Needle Type: Echogenic Stimulator Needle     Needle Length: 5cm  Needle Gauge: 22     Additional Needles:   Procedures:,,,, ultrasound used (permanent image in chart),,    Narrative:  Start time: 09/03/2023 12:07 PM End time: 09/03/2023 12:17 PM Injection made incrementally with aspirations every 5 mL. Anesthesiologist: Gaynelle Adu, MD

## 2023-09-03 NOTE — Brief Op Note (Signed)
09/03/2023  2:08 PM  PATIENT:  Nicholas [name]  62 y.o. male  PRE-OPERATIVE DIAGNOSIS:  Right shoulder acromioclavicular osteoarthritis ,adhesive capsulitis  POST-OPERATIVE DIAGNOSIS:  Right shoulder acromioclavicular osteoarthritis ,Subacromial abursitis, degenerative labrum tearing, long head of biceps tear  PROCEDURE:  Procedure(s): SHOULDER ARTHROSCOPY WITH DISTAL CLAVICLE RESECTION, SUBACROMIAL DECOMPRESSION, EXTENSIVE DEBRIDEMENT (Right)  SURGEON:  Surgeons and Role:    * Yolonda Kida, MD - Primary  PHYSICIAN ASSISTANT: Dion Saucier, PA-C  ANESTHESIA:   regional and general  EBL:  5 mL   BLOOD ADMINISTERED:none  DRAINS: none   LOCAL MEDICATIONS USED:  NONE  SPECIMEN:  No Specimen  DISPOSITION OF SPECIMEN:  N/A  COUNTS:  YES  TOURNIQUET:  * No tourniquets in log *  DICTATION: .Note written in EPIC  PLAN OF CARE: Admit for overnight observation  PATIENT DISPOSITION:  PACU - hemodynamically stable.   Delay start of Pharmacological VTE agent (>24hrs) due to surgical blood loss or risk of bleeding: not applicable

## 2023-09-03 NOTE — H&P (Signed)
ORTHOPAEDIC H&P  REQUESTING PHYSICIAN: Yolonda Kida, MD  PCP:  Quita Skye, PA-C  Chief Complaint: Right shoulder pain  HPI: Nicholas [name] is a 62 y.o. male who complains of right shoulder pain and stiffness.  Here today for arthroscopic surgery with distal clavicle resection as well as interventions as indicated at the time of diagnostic arthroscopy.  No new complaints.  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76---  LAST SEIZURE 1 WK AGO (APPROX ,  10-31-2013)   NO NEUROLOGIST---  PT SEES PCP  DR Lindajo Royal   Feeling of incomplete bladder emptying    Frequency of urination    Gastric ulcer    GERD (gastroesophageal reflux disease)    Glaucoma    Hypertension    Hyperthyroidism    NO MEDS    Migraine    Seizures (HCC)    Sleep apnea    Stroke (HCC)    TIA (transient ischemic attack)    Type 2 diabetes mellitus (HCC)    Past Surgical History:  Procedure Laterality Date   ABDOMINAL SURGERY     ANTERIOR CERVICAL DECOMP/DISCECTOMY FUSION  2007   C4  --  C6   CARDIAC CATHETERIZATION     CERVICAL FUSION     CHOLECYSTECTOMY     COLONOSCOPY WITH PROPOFOL Left 11/02/2020   Procedure: COLONOSCOPY WITH PROPOFOL;  Surgeon: Willis Modena, MD;  Location: [facility];  Service: Endoscopy;  Laterality: Left;   CYSTOSCOPY N/A 11/09/2013   Procedure: CYSTOSCOPY FLEXIBLE;  Surgeon: Bjorn Pippin, MD;  Location: [facility];  Service: Urology;  Laterality: N/A;   EPIDIDYMECTOMY Left 11/09/2013   Procedure:  LEFT EPIDIDYMECTOMY;  Surgeon: Bjorn Pippin, MD;  Location: [facility];  Service: Urology;  Laterality: Left;  POSSIBLE OUTPATIENT WITH OBSERVATION   EXCISION LIPOMA LEFT SHOULDER  2004 (APPROX)   MULTIPLE CYST REMOVED FROM CHEST   AGE 39   OTHER SURGICAL HISTORY     hemorroid surgery    TESTICLE REMOVAL Left    TONSILLECTOMY     Social History   Socioeconomic History   Marital status: Widowed    Spouse name: Not on file   Number of children: 5   Years of education: Not on file   Highest education level: Not on file  Occupational History   Not on file  Tobacco Use   Smoking status: Former    Current packs/day: 0.00    Average packs/day: 0.3 packs/day for 15.0 years (3.8 ttl pk-yrs)    Types: Cigarettes    Start date: 11/08/1977    Quit date: 11/08/1992    Years since quitting: 30.8   Smokeless tobacco: Never  Vaping Use   Vaping status: Never Used  Substance and Sexual Activity   Alcohol use: No   Drug use: No   Sexual activity: Not Currently  Other Topics Concern   Not on file  Social History Narrative   ** Merged History Encounter **    Are you right handed or left handed?  Right    Are you currently employed ?  Disability    What is your current occupation?   Do you live at  home alone? alone   Who lives with you? NA   What type of home do you live in: 1 story or 2 story?  1 story        Social Determinants of Health   Financial Resource Strain: Low Risk  (05/14/2023)   Overall Financial Resource Strain (CARDIA)    Difficulty of Paying Living Expenses: Not hard at all  Food Insecurity: Not on file (06/01/2023)  Transportation Needs: At Risk (06/01/2023)   Received from [facility], [employer] Needs    Transportation: 2  Physical Activity: Inactive (05/14/2023)   Exercise Vital Sign    Days of Exercise per Week: 0 days    Minutes of Exercise per Session: 0 min  Stress: No Stress Concern Present (05/14/2023)   [employer] of Occupational Health - Occupational Stress Questionnaire    Feeling of Stress : Not at all  Social Connections: Not on File (08/11/2023)   Received from [facility]   Social Connections    Connectedness: 0  Recent Concern: Social Connections - Moderately Isolated  (05/14/2023)   Social Connection and Isolation Panel [NHANES]    Frequency of Communication with Friends and Family: More than three times a week    Frequency of Social Gatherings with Friends and Family: Three times a week    Attends Religious Services: More than 4 times per year    Active Member of Clubs or Organizations: No    Attends Banker Meetings: Never    Marital Status: Widowed   Family History  Problem Relation Age of Onset   Heart failure Mother    Diabetes Mother    Hypertension Mother    Cirrhosis Father    Heart failure Father    Kidney disease Father    Heart failure Brother    Heart disease Brother    Heart disease Brother    Allergies  Allergen Reactions   Finasteride Other (See Comments)    Break out   Levothyroxine Anaphylaxis and Cough    Chronic cough   Nsaids Other (See Comments)    D/t gastric ulcer   Prednisone Itching, Other (See Comments) and Anaphylaxis    Throat swelling and cough   Amoxicillin Itching and Other (See Comments)    THRUSH Causes sores in mouth   Ampicillin Other (See Comments)    THRUSH   Penicillins Itching and Other (See Comments)    THRUSH- Causes sores in mouth   Strawberry Extract Swelling    LIPS SWELL   Asa [Aspirin] Other (See Comments)    Bleeding    Bactrim [Sulfamethoxazole-Trimethoprim] Hives, Itching and Other (See Comments)    GI upset   Dapagliflozin     Other reaction(s): Other (See Comments) Allergic to steroids and had mouth broke out.    Depakote [Divalproex Sodium]     Causes double vision and speech problems    Dilantin [Phenytoin] Other (See Comments)    Severe skin peeling   Methocarbamol Other (See Comments)    dizziness   Other     Med for prostate that startes with a M- Broke out the insid eof mouth, split the tongue, and caused throudh   Risperidone And Related Other (See Comments)    Hallucinations    Sitagliptin Other (See Comments)    Pancreatitis    Strawberry  (Diagnostic) Itching and Swelling   Sulfa Antibiotics Other (See Comments)    Affects thyroid    Tolmetin Other (See Comments)    D/t gastric ulcer  Ultram [Tramadol Hcl] Other (See Comments)    D/t gastric ulcer   Buprenorphine Itching, Rash and Other (See Comments)    "Patches broke me out in red patches-  caused a fever, also"   Oatmeal Hives and Other (See Comments)    White spots/sores in mouth   Prior to Admission medications   Medication Sig Start Date End Date Taking? Authorizing Provider  acetaminophen (TYLENOL) 325 MG tablet Take 2 tablets (650 mg total) by mouth every 6 (six) hours as needed for mild pain (or Fever >/= 101). 07/01/22  Yes Danford, Earl Lites, MD  albuterol (PROVENTIL HFA;VENTOLIN HFA) 108 (90 Base) MCG/ACT inhaler Inhale 1-2 puffs into the lungs every 6 (six) hours as needed for wheezing or shortness of breath. 11/15/18  Yes Rancour, Jeannett Senior, MD  ammonium lactate (LAC-HYDRIN) 12 % lotion Apply 1 Application topically 2 (two) times daily. 08/03/23  Yes [provider]  atorvastatin (LIPITOR) 80 MG tablet Take 1 tablet (80 mg total) by mouth at bedtime. Patient taking differently: Take 80 mg by mouth daily as needed (cholesterol). 09/05/20  Yes Danford, Earl Lites, MD  brimonidine (ALPHAGAN) 0.2 % ophthalmic solution Place 1 drop into both eyes 2 (two) times daily. 06/01/23  Yes [provider]  budesonide-formoterol (SYMBICORT) 80-4.5 MCG/ACT inhaler Inhale 2 puffs into the lungs daily. 04/12/23  Yes Lockie Mola, MD  clopidogrel (PLAVIX) 75 MG tablet TAKE 1 TABLET(75 MG) BY MOUTH DAILY 04/15/23  Yes Lockie Mola, MD  cyclobenzaprine (FLEXERIL) 5 MG tablet Take 1 tablet (5 mg total) by mouth every 8 (eight) hours as needed for muscle spasms 07/02/23  Yes   diclofenac Sodium (VOLTAREN ARTHRITIS PAIN) 1 % GEL Apply 2 g topically 4 (four) times daily. 07/01/22  Yes Danford, Earl Lites, MD  Eszopiclone 3 MG TABS Take 1 tablet (3 mg total) by mouth  at bedtime. 07/01/22  Yes Danford, Earl Lites, MD  fenofibrate (TRICOR) 145 MG tablet TAKE 1 TABLET(145 MG) BY MOUTH DAILY 05/14/23  Yes Lockie Mola, MD  gabapentin (NEURONTIN) 600 MG tablet Take 1 tablet (600 mg total) by mouth daily at 6 (six) AM. Patient taking differently: Take 600 mg by mouth 2 (two) times daily. 12/06/20  Yes Swayze, Ava, DO  insulin aspart (NOVOLOG) 100 UNIT/ML injection Inject 6-16 Units into the skin 3 (three) times daily with meals. Sliding scale If blood is over 150   Yes [provider]  lactulose (CHRONULAC) 10 GM/15ML solution Take 45 mLs (30 g total) by mouth daily as needed for moderate constipation. 01/15/23  Yes Lockie Mola, MD  latanoprost (XALATAN) 0.005 % ophthalmic solution Place 1 drop into both eyes at bedtime.  07/04/20  Yes [provider]  lidocaine (HM LIDOCAINE PATCH) 4 % Place 1 patch onto the skin daily. Patient taking differently: Place 1 patch onto the skin daily. As needed 04/15/23  Yes Lockie Mola, MD  lurasidone (LATUDA) 40 MG TABS tablet Take 40 mg by mouth at bedtime. 06/14/22  Yes [provider]  metFORMIN (GLUCOPHAGE) 1000 MG tablet Take 1,000 mg by mouth 2 (two) times daily. 11/04/20  Yes [provider]  nortriptyline (PAMELOR) 50 MG capsule Take 100 mg by mouth daily as needed (Sleep).   Yes [provider]  ondansetron (ZOFRAN-ODT) 4 MG disintegrating tablet Take 1 tablet (4 mg total) by mouth every 8 (eight) hours as needed for nausea or vomiting. 08/19/23  Yes Tegeler, Canary Brim, MD  oxyCODONE-acetaminophen (PERCOCET) 10-325 MG tablet Take 1 tablet by mouth  4 (four) times daily as directed 08/10/23  Yes   TOUJEO MAX SOLOSTAR 300 UNIT/ML Solostar Pen Inject 33 Units into the skin at bedtime. 07/01/22  Yes Danford, Earl Lites, MD  traZODone (DESYREL) 100 MG tablet Take 100 mg by mouth at bedtime. 06/25/22  Yes [provider]  verapamil (VERELAN) 100 MG 24 hr capsule TAKE 1  CAPSULE(100 MG) BY MOUTH AT BEDTIME Patient taking differently: Take 80 mg by mouth at bedtime. 04/12/23  Yes Lockie Mola, MD  zonisamide (ZONEGRAN) 100 MG capsule Take 2 capsules twice a day 08/13/23  Yes Van Clines, MD  methocarbamol (ROBAXIN-750) 750 MG tablet Take 1 tablet (750 mg total) by mouth every 8 (eight) hours as needed for muscle spasms. Patient not taking: Reported on 08/25/2023 10/26/22   Cora Collum, DO  mupirocin ointment (BACTROBAN) 2 % Apply 1 Application topically 3 (three) times daily. 07/06/23   [provider]  rizatriptan (MAXALT-MLT) 10 MG disintegrating tablet Take 1 tablet (10 mg total) by mouth as needed for migraine. May repeat in 2 hours if needed 08/13/23   Van Clines, MD   No results found.  Positive ROS: All other systems have been reviewed and were otherwise negative with the exception of those mentioned in the HPI and as above.  Physical Exam: General: Alert, no acute distress Cardiovascular: No pedal edema Respiratory: No cyanosis, no use of accessory musculature GI: No organomegaly, abdomen is soft and non-tender Skin: No lesions in the area of chief complaint Neurologic: Sensation intact distally Psychiatric: Patient is competent for consent with normal mood and affect Lymphatic: No axillary or cervical lymphadenopathy  MUSCULOSKELETAL: Right shoulder exam is benign with no open wounds or lesions.  Warm and well-perfused and neurovascularly intact.  Assessment: 1.  Right shoulder adhesive capsulitis  2.  Right shoulder symptomatic acromioclavicular arthropathy  Plan: Plan to proceed with right shoulder arthroscopy today.  Again reviewed the risk and benefits of the procedure which include but not limited to bleeding, infection, damage to surrounding nerves and vessels, stiffness, persistent pain and weakness, fracture, need for subsequent surgeries as well as risk of anesthesia.  He is provided informed consent.  Plan for  discharge home postoperatively from PACU.    Yolonda Kida, MD Cell [phone]    09/03/2023 11:21 AM

## 2023-09-03 NOTE — Discharge Instructions (Signed)
Orthopedic surgery discharge instructions:  -Maintain postoperative bandage until follow-up appointment.  This is waterproof, and you may begin showering on postoperative day #3.  Do not submerge underwater.  Maintain that bandage until your follow-up appointment in 2 weeks.  -No lifting over 2 pounds with operateive arm.  You may use the arm immediately for activities of daily living such as bathing, washing your face and brushing your teeth, eating, and getting dressed.  Otherwise maintain your sling when you are out of the house and sleeping.  -Apply ice liberally to the shoulder throughout the day.  For mild to moderate pain use Tylenol and Advil as needed around-the-clock.  For breakthrough pain use oxycodone as necessary.  -You will return to see Dr. Ashely Joshua in the office in 2 weeks for routine postoperative check with x-rays.  

## 2023-09-04 ENCOUNTER — Encounter (HOSPITAL_COMMUNITY): Payer: Self-pay | Admitting: Orthopedic Surgery

## 2023-09-04 DIAGNOSIS — Z87891 Personal history of nicotine dependence: Secondary | ICD-10-CM | POA: Diagnosis not present

## 2023-09-04 DIAGNOSIS — Z8673 Personal history of transient ischemic attack (TIA), and cerebral infarction without residual deficits: Secondary | ICD-10-CM | POA: Diagnosis not present

## 2023-09-04 DIAGNOSIS — J45909 Unspecified asthma, uncomplicated: Secondary | ICD-10-CM | POA: Diagnosis not present

## 2023-09-04 DIAGNOSIS — E119 Type 2 diabetes mellitus without complications: Secondary | ICD-10-CM | POA: Diagnosis not present

## 2023-09-04 DIAGNOSIS — M25611 Stiffness of right shoulder, not elsewhere classified: Secondary | ICD-10-CM | POA: Diagnosis not present

## 2023-09-04 DIAGNOSIS — Z7984 Long term (current) use of oral hypoglycemic drugs: Secondary | ICD-10-CM | POA: Diagnosis not present

## 2023-09-04 DIAGNOSIS — I10 Essential (primary) hypertension: Secondary | ICD-10-CM | POA: Diagnosis not present

## 2023-09-04 DIAGNOSIS — M19011 Primary osteoarthritis, right shoulder: Secondary | ICD-10-CM | POA: Diagnosis not present

## 2023-09-04 DIAGNOSIS — Z79899 Other long term (current) drug therapy: Secondary | ICD-10-CM | POA: Diagnosis not present

## 2023-09-04 LAB — GLUCOSE, CAPILLARY: Glucose-Capillary: 100 mg/dL — ABNORMAL HIGH (ref 70–99)

## 2023-09-04 NOTE — Progress Notes (Signed)
Nicholas [name]  MRN: [id number] DOB/Age: January 09, 1961 62 y.o. [facility] Procedure: Procedure(s) (LRB): SHOULDER ARTHROSCOPY WITH DISTAL CLAVICLE RESECTION, SUBACROMIAL DECOMPRESSION, EXTENSIVE DEBRIDEMENT (Right)     Subjective: Effects of the block still working.  Pain is controlled.  Currently eating breakfast.  Has voided without difficulty  Vital Signs Temp:  [97.5 F (36.4 C)-98.5 F (36.9 C)] 97.5 F (36.4 C) (09/28 0539) Pulse Rate:  [44-70] 44 (09/28 0539) Resp:  [11-21] 15 (09/28 0539) BP: (106-137)/(65-83) 113/65 (09/28 0539) SpO2:  [89 %-100 %] 91 % (09/28 0614) FiO2 (%):  [21 %] 21 % (09/27 1951) Weight:  [84.8 kg] 84.8 kg (09/27 1102)  Lab Results Recent Labs    09/03/23 1659  WBC 5.7  HGB 11.5*  HCT 35.7*  PLT 142*   BMET Recent Labs    09/03/23 1659  CREATININE 1.02   INR  Date Value Ref Range Status  08/19/2023 1.2 0.8 - 1.2 Final    Comment:    (NOTE) INR goal varies based on device and disease states. Performed at [facility], [address] [place], [place] [address]      Exam Right shoulder bulky dressing clean and dry.  Effects of the block still working but moves hand well.        Plan Discharge home today.  Prescriptions were provided and his instructions are in the chart.  French Ana Raena Pau PA-C  09/04/2023, 9:28 AM Contact # [phone]

## 2023-09-04 NOTE — Plan of Care (Signed)
  Problem: Education: Goal: Ability to describe self-care measures that may prevent or decrease complications (Diabetes Survival Skills Education) will improve Outcome: Progressing   Problem: Skin Integrity: Goal: Risk for impaired skin integrity will decrease Outcome: Progressing   Problem: Education: Goal: Knowledge of General Education information will improve Description: Including pain rating scale, medication(s)/side effects and non-pharmacologic comfort measures Outcome: Progressing   Problem: Activity: Goal: Risk for activity intolerance will decrease Outcome: Progressing   Problem: Pain Managment: Goal: General experience of comfort will improve Outcome: Progressing   Problem: Safety: Goal: Ability to remain free from injury will improve Outcome: Progressing

## 2023-09-04 NOTE — Anesthesia Postprocedure Evaluation (Signed)
Anesthesia Post Note  Patient: Nicholas [name]  Procedure(s) Performed: SHOULDER ARTHROSCOPY WITH DISTAL CLAVICLE RESECTION, SUBACROMIAL DECOMPRESSION, EXTENSIVE DEBRIDEMENT (Right: Shoulder)     Patient location during evaluation: PACU Anesthesia Type: General and Regional Level of consciousness: awake and alert Pain management: pain level controlled Vital Signs Assessment: post-procedure vital signs reviewed and stable Respiratory status: spontaneous breathing, nonlabored ventilation and respiratory function stable Cardiovascular status: blood pressure returned to baseline and stable Postop Assessment: no apparent nausea or vomiting Anesthetic complications: no  No notable events documented.  Last Vitals:  Vitals:   09/04/23 0614 09/04/23 1033  BP:  (!) 142/71  Pulse:  (!) 49  Resp:  14  Temp:  37.2 C  SpO2: 91% 96%    Last Pain:  Vitals:   09/04/23 0841  TempSrc:   PainSc: 0-No pain                 Romar Woodrick,W. EDMOND

## 2023-09-06 DIAGNOSIS — Z79899 Other long term (current) drug therapy: Secondary | ICD-10-CM | POA: Diagnosis not present

## 2023-09-06 DIAGNOSIS — Z5181 Encounter for therapeutic drug level monitoring: Secondary | ICD-10-CM | POA: Diagnosis not present

## 2023-09-07 DIAGNOSIS — R32 Unspecified urinary incontinence: Secondary | ICD-10-CM | POA: Diagnosis not present

## 2023-09-08 ENCOUNTER — Other Ambulatory Visit (HOSPITAL_COMMUNITY): Payer: Self-pay

## 2023-09-08 ENCOUNTER — Ambulatory Visit (HOSPITAL_COMMUNITY): Payer: Medicare HMO

## 2023-09-08 MED ORDER — CYCLOBENZAPRINE HCL 5 MG PO TABS
5.0000 mg | ORAL_TABLET | Freq: Three times a day (TID) | ORAL | 1 refills | Status: DC | PRN
  Filled 2023-09-08: qty 30, 10d supply, fill #0

## 2023-09-08 MED ORDER — OXYCODONE-ACETAMINOPHEN 10-325 MG PO TABS
1.0000 | ORAL_TABLET | Freq: Four times a day (QID) | ORAL | 0 refills | Status: DC
Start: 2023-09-08 — End: 2023-09-14
  Filled 2023-09-08: qty 28, 7d supply, fill #0

## 2023-09-09 NOTE — Discharge Summary (Signed)
Patient ID: Nicholas [name] MRN: [id number] DOB/AGE: July 06, 1961 62 y.o.  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32---  LAST SEIZURE 1 WK AGO (APPROX ,  10-31-2013)   NO NEUROLOGIST---  PT SEES PCP  DR Lindajo Royal   Feeling of incomplete bladder emptying    Frequency of urination    Gastric ulcer    GERD (gastroesophageal reflux disease)    Glaucoma    Hypertension    Hyperthyroidism    NO MEDS    Migraine    Seizures (HCC)    Sleep apnea    Stroke (HCC)    TIA (transient ischemic attack)    Type 2 diabetes mellitus (HCC)    Discharge Diagnoses:   Principal Problem:   Complete rotator cuff tear or rupture of right shoulder, not specified as traumatic  Estimated body mass index is 29.29 kg/m as calculated from the following:   Height as of this encounter: 5\' 7"  (1.702 m).   Weight as of this encounter: 84.8 kg.  Procedure:  Procedure(s) (LRB): SHOULDER ARTHROSCOPY WITH DISTAL CLAVICLE RESECTION, SUBACROMIAL DECOMPRESSION, EXTENSIVE DEBRIDEMENT (Right)   Consults: None  HPI: Nicholas [name] is a 62 year old male presenting to the hospital for right shoulder arthroscopy with distal clavicle resection after failure of conservative treatments. He was admitted to the hospital for post operative pain management and monitoring.   Laboratory Data: Admission on 09/03/2023, Discharged on 09/04/2023  Component Date Value Ref Range Status   Glucose-Capillary 09/03/2023 145 (H)  70 - 99 mg/dL Final   Glucose reference range applies only to samples taken after fasting  for at least 8 hours.   Comment 1 09/03/2023 Notify RN   Final   Comment 2 09/03/2023 Document in Chart   Final   Glucose-Capillary 09/03/2023 119 (H)  70 - 99 mg/dL Final   Glucose reference range applies only to samples taken after fasting for at least 8 hours.   Glucose-Capillary 09/03/2023 110 (H)  70 - 99 mg/dL Final   Glucose reference range applies only to samples taken after fasting for at least 8 hours.   WBC 09/03/2023 5.7  4.0 - 10.5 K/uL Final   RBC 09/03/2023 4.16 (L)  4.22 - 5.81 MIL/uL Final   Hemoglobin 09/03/2023 11.5 (L)  13.0 - 17.0 g/dL Final   HCT 33/29/5188 35.7 (L)  39.0 - 52.0 % Final   MCV 09/03/2023 85.8  80.0 - 100.0 fL Final   MCH 09/03/2023 27.6  26.0 - 34.0 pg Final   MCHC 09/03/2023 32.2  30.0 - 36.0 g/dL Final   RDW 41/66/0630 15.4  11.5 - 15.5 % Final   Platelets 09/03/2023 142 (L)  150 - 400 K/uL Final   nRBC 09/03/2023 0.0  0.0 - 0.2 % Final   Performed at [facility], [address] [place], [place] [address]   Creatinine, Ser 09/03/2023 1.02  0.61 - 1.24 mg/dL Final   GFR, Estimated 09/03/2023 >60  >60 mL/min Final   Comment: (NOTE) Calculated using the  CKD-EPI Creatinine Equation (2021) Performed at [facility], [address] [place], [place] [address]    Glucose-Capillary 09/03/2023 105 (H)  70 - 99 mg/dL Final   Glucose reference range applies only to samples taken after fasting for at least 8 hours.   Glucose-Capillary 09/03/2023 142 (H)  70 - 99 mg/dL Final   Glucose reference range applies only to samples taken after fasting for at least 8 hours.   Glucose-Capillary 09/04/2023 100 (H)  70 - 99 mg/dL Final   Glucose reference range applies only to samples taken after fasting for at least 8 hours.  Hospital Outpatient Visit on 08/30/2023  Component Date Value Ref Range Status   Hgb A1c MFr Bld 08/30/2023 7.2 (H)  4.8 - 5.6 % Final   Comment: (NOTE) Pre diabetes:          5.7%-6.4%  Diabetes:               >6.4%  Glycemic control for   <7.0% adults with diabetes    Mean Plasma Glucose 08/30/2023 159.94  mg/dL Final   Performed at [facility], [address] [place], [place] [address]   Glucose-Capillary 08/30/2023 73  70 - 99 mg/dL Final   Glucose reference range applies only to samples taken after fasting for at least 8 hours.  Admission on 08/19/2023, Discharged on 08/19/2023  Component Date Value Ref Range Status   Sodium 08/19/2023 139  135 - 145 mmol/L Final   Potassium 08/19/2023 3.4 (L)  3.5 - 5.1 mmol/L Final   Chloride 08/19/2023 108  98 - 111 mmol/L Final   CO2 08/19/2023 18 (L)  22 - 32 mmol/L Final   Glucose, Bld 08/19/2023 176 (H)  70 - 99 mg/dL Final   Glucose reference range applies only to samples taken after fasting for at least 8 hours.   BUN 08/19/2023 11  8 - 23 mg/dL Final   Creatinine, Ser 08/19/2023 1.39 (H)  0.61 - 1.24 mg/dL Final   Calcium 84/69/6295 9.2  8.9 - 10.3 mg/dL Final   Total Protein 28/41/3244 8.1  6.5 - 8.1 g/dL Final   Albumin 12/09/7251 4.2  3.5 - 5.0 g/dL Final   AST 66/44/0347 28  15 - 41 U/L Final   ALT 08/19/2023 34  0 - 44 U/L Final   Alkaline Phosphatase 08/19/2023 70  38 - 126 U/L Final   Total Bilirubin 08/19/2023 0.5  0.3 - 1.2 mg/dL Final   GFR, Estimated 08/19/2023 58 (L)  >60 mL/min Final   Comment: (NOTE) Calculated using the CKD-EPI Creatinine Equation (2021)    Anion gap 08/19/2023 13  5 - 15 Final   Performed at [facility], [address], [place], [place] [address]   Lactic Acid, Venous 08/19/2023 2.7 (HH)  0.5 - 1.9 mmol/L Final   Comment 08/19/2023 NOTIFIED PHYSICIAN   Final   WBC 08/19/2023 6.2  4.0 - 10.5 K/uL Final   RBC 08/19/2023 5.60  4.22 - 5.81 MIL/uL Final   Hemoglobin 08/19/2023 15.0  13.0 - 17.0 g/dL Final   HCT 63/87/5643 48.8  39.0 - 52.0 % Final   MCV 08/19/2023 87.1  80.0 - 100.0 fL Final   MCH 08/19/2023 26.8  26.0 - 34.0 pg Final   MCHC 08/19/2023 30.7  30.0 - 36.0 g/dL Final    RDW 32/95/1884 14.8  11.5 - 15.5 % Final   Platelets 08/19/2023 170  150 - 400 K/uL Final   nRBC 08/19/2023  0.0  0.0 - 0.2 % Final   Neutrophils Relative % 08/19/2023 79  % Final   Neutro Abs 08/19/2023 4.9  1.7 - 7.7 K/uL Final   Lymphocytes Relative 08/19/2023 11  % Final   Lymphs Abs 08/19/2023 0.7  0.7 - 4.0 K/uL Final   Monocytes Relative 08/19/2023 10  % Final   Monocytes Absolute 08/19/2023 0.6  0.1 - 1.0 K/uL Final   Eosinophils Relative 08/19/2023 0  % Final   Eosinophils Absolute 08/19/2023 0.0  0.0 - 0.5 K/uL Final   Basophils Relative 08/19/2023 0  % Final   Basophils Absolute 08/19/2023 0.0  0.0 - 0.1 K/uL Final   Immature Granulocytes 08/19/2023 0  % Final   Abs Immature Granulocytes 08/19/2023 0.02  0.00 - 0.07 K/uL Final   Performed at [facility], [address] [place], [place] [address]   Prothrombin Time 08/19/2023 15.1  11.4 - 15.2 seconds Final   INR 08/19/2023 1.2  0.8 - 1.2 Final   Comment: (NOTE) INR goal varies based on device and disease states. Performed at [facility], [address], [place], [place] [address]    Specimen Description 08/19/2023 BLOOD RIGHT ANTECUBITAL   Final   Special Requests 08/19/2023 BOTTLES DRAWN AEROBIC AND ANAEROBIC Blood Culture results may not be optimal due to an inadequate volume of blood received in culture bottles   Final   Culture 08/19/2023    Final                   Value:NO GROWTH 5 DAYS Performed at [facility], [address], [place], [place] [address]    Report Status 08/19/2023 08/24/2023 FINAL   Final   Specimen Description 08/19/2023 BLOOD RIGHT ANTECUBITAL   Final   Special Requests 08/19/2023 BOTTLES DRAWN AEROBIC AND ANAEROBIC Blood Culture results may not be optimal due to an inadequate volume of blood received in culture bottles   Final   Culture 08/19/2023    Final                   Value:NO GROWTH 5 DAYS Performed at [facility], [address] [place], [place] [address]     Report Status 08/19/2023 08/24/2023 FINAL   Final   Specimen Source 08/19/2023 URINE, CATHETERIZED   Final   Color, Urine 08/19/2023 YELLOW  YELLOW Final   APPearance 08/19/2023 CLEAR  CLEAR Final   Specific Gravity, Urine 08/19/2023 >1.030 (H)  1.005 - 1.030 Final   pH 08/19/2023 6.0  5.0 - 8.0 Final   Glucose, UA 08/19/2023 100 (A)  NEGATIVE mg/dL Final   Hgb urine dipstick 08/19/2023 NEGATIVE  NEGATIVE Final   Bilirubin Urine 08/19/2023 SMALL (A)  NEGATIVE Final   Ketones, ur 08/19/2023 15 (A)  NEGATIVE mg/dL Final   Protein, ur 65/78/4696 100 (A)  NEGATIVE mg/dL Final   Nitrite 29/52/8413 NEGATIVE  NEGATIVE Final   Leukocytes,Ua 08/19/2023 NEGATIVE  NEGATIVE Final   Squamous Epithelial / HPF 08/19/2023 0-5  0 - 5 /HPF Final   Comment: MICROSCOPIC EXAM PERFORMED ON UNCONCENTRATED URINE LESS THAN 10 mL OF URINE SUBMITTED    WBC, UA 08/19/2023 0-5  0 - 5 WBC/hpf Final   Comment: Reflex urine culture not performed if WBC <=10, OR if Squamous epithelial cells >5. If Squamous epithelial cells >5, suggest recollection. MICROSCOPIC EXAM PERFORMED ON UNCONCENTRATED URINE LESS THAN 10 mL OF URINE SUBMITTED    RBC / HPF 08/19/2023 0-5  0 - 5 RBC/hpf Final   Comment: MICROSCOPIC EXAM PERFORMED ON UNCONCENTRATED URINE LESS THAN 10 mL OF URINE SUBMITTED    Bacteria, UA 08/19/2023 FEW (A)  NONE SEEN Final   Comment: MICROSCOPIC EXAM PERFORMED ON UNCONCENTRATED URINE LESS THAN 10 mL OF URINE SUBMITTED    Mucus 08/19/2023 PRESENT   Final   Comment: MICROSCOPIC EXAM PERFORMED ON UNCONCENTRATED URINE LESS THAN 10 mL OF URINE SUBMITTED    Hyaline Casts, UA 08/19/2023 PRESENT   Final   Comment: MICROSCOPIC EXAM PERFORMED ON UNCONCENTRATED URINE LESS THAN 10 mL OF URINE SUBMITTED Performed at [facility], [address], [place], [place] [address]    SARS Coronavirus 2 by RT PCR 08/19/2023 NEGATIVE  NEGATIVE Final   Influenza A by PCR 08/19/2023 NEGATIVE  NEGATIVE Final   Influenza B by  PCR 08/19/2023 NEGATIVE  NEGATIVE Final   Comment: (NOTE) The Xpert Xpress SARS-CoV-2/FLU/RSV plus assay is intended as an aid in the diagnosis of influenza from Nasopharyngeal swab specimens and should not be used as a sole basis for treatment. Nasal washings and aspirates are unacceptable for Xpert Xpress SARS-CoV-2/FLU/RSV testing.  Fact Sheet for Patients: BloggerCourse.com  Fact Sheet for Healthcare Providers: SeriousBroker.it  This test is not yet approved or cleared by the [place] FDA and has been authorized for detection and/or diagnosis of SARS-CoV-2 by FDA under an Emergency Use Authorization (EUA). This EUA will remain in effect (meaning this test can be used) for the duration of the COVID-19 declaration under Section 564(b)(1) of the Act, 21 U.S.C. section 360bbb-3(b)(1), unless the authorization is terminated or revoked.     Resp Syncytial Virus by PCR 08/19/2023 NEGATIVE  NEGATIVE Final   Comment: (NOTE) Fact Sheet for Patients: BloggerCourse.com  Fact Sheet for Healthcare Providers: SeriousBroker.it  This test is not yet approved or cleared by the [place] FDA and has been authorized for detection and/or diagnosis of SARS-CoV-2 by FDA under an Emergency Use Authorization (EUA). This EUA will remain in effect (meaning this test can be used) for the duration of the COVID-19 declaration under Section 564(b)(1) of the Act, 21 U.S.C. section 360bbb-3(b)(1), unless the authorization is terminated or revoked.  Performed at [facility], [address] [place], [place] [address]    Total CK 08/19/2023 290  49 - 397 U/L Final   Performed at [facility], [address], [place], [place] [address]   Lipase 08/19/2023 21  11 - 51 U/L Final   Performed at [facility], [address], [place], [place] [address]   Magnesium 08/19/2023 1.4 (L)   1.7 - 2.4 mg/dL Final   Performed at [facility], [address] [place], [place] [address]   Lactic Acid, Venous 08/19/2023 1.6  0.5 - 1.9 mmol/L Final  Admission on 07/28/2023, Discharged on 07/28/2023  Component Date Value Ref Range Status   SARS Coronavirus 2 by RT PCR 07/28/2023 POSITIVE (A)  NEGATIVE Final   Performed at [facility], [address], [place], [place] [address]   WBC 07/28/2023 7.1  4.0 - 10.5 K/uL Final   RBC 07/28/2023 5.31  4.22 - 5.81 MIL/uL Final   Hemoglobin 07/28/2023 14.9  13.0 - 17.0 g/dL Final   HCT 57/84/6962 46.0  39.0 - 52.0 % Final   MCV 07/28/2023 86.6  80.0 - 100.0 fL Final   MCH 07/28/2023 28.1  26.0 - 34.0 pg Final   MCHC 07/28/2023 32.4  30.0 - 36.0 g/dL Final   RDW 40/98/1191 15.1  11.5 - 15.5 % Final   Platelets 07/28/2023 152  150 - 400 K/uL Final   nRBC 07/28/2023 0.0  0.0 - 0.2 % Final   Neutrophils Relative % 07/28/2023 88  % Final   Neutro Abs 07/28/2023 6.2  1.7 - 7.7 K/uL Final   Lymphocytes Relative 07/28/2023 6  % Final   Lymphs Abs 07/28/2023 0.4 (L)  0.7 - 4.0 K/uL Final   Monocytes Relative 07/28/2023 6  % Final   Monocytes Absolute 07/28/2023 0.4  0.1 - 1.0 K/uL Final   Eosinophils Relative 07/28/2023 0  % Final   Eosinophils Absolute 07/28/2023 0.0  0.0 - 0.5 K/uL Final   Basophils Relative 07/28/2023 0  % Final   Basophils Absolute 07/28/2023 0.0  0.0 - 0.1 K/uL Final   Immature Granulocytes 07/28/2023 0  % Final   Abs Immature Granulocytes 07/28/2023 0.01  0.00 - 0.07 K/uL Final   Performed at [facility], [address] [place], [place] [address]   Sodium 07/28/2023 140  135 - 145 mmol/L Final   Potassium 07/28/2023 3.7  3.5 - 5.1 mmol/L Final   Chloride 07/28/2023 105  98 - 111 mmol/L Final   CO2 07/28/2023 20 (L)  22 - 32 mmol/L Final   Glucose, Bld 07/28/2023 137 (H)  70 - 99 mg/dL Final   Glucose reference range applies only to samples taken after fasting for at least 8 hours.   BUN 07/28/2023 13   8 - 23 mg/dL Final   Creatinine, Ser 07/28/2023 1.44 (H)  0.61 - 1.24 mg/dL Final   Calcium 56/21/3086 10.0  8.9 - 10.3 mg/dL Final   Total Protein 57/84/6962 8.1  6.5 - 8.1 g/dL Final   Albumin 95/28/4132 4.6  3.5 - 5.0 g/dL Final   AST 44/12/270 27  15 - 41 U/L Final   ALT 07/28/2023 32  0 - 44 U/L Final   Alkaline Phosphatase 07/28/2023 71  38 - 126 U/L Final   Total Bilirubin 07/28/2023 1.1  0.3 - 1.2 mg/dL Final   GFR, Estimated 07/28/2023 55 (L)  >60 mL/min Final   Comment: (NOTE) Calculated using the CKD-EPI Creatinine Equation (2021)    Anion gap 07/28/2023 15  5 - 15 Final   Performed at [facility], [address] [place] [address], [place], [place] [address]   Lipase 07/28/2023 24  11 - 51 U/L Final   Performed at [facility], [address], [place], [place] [address]   Lactic Acid, Venous 07/28/2023 2.0 (HH)  0.5 - 1.9 mmol/L Final   Comment: CRITICAL RESULT CALLED TO, READ BACK BY AND VERIFIED WITH A. BANKS, RN AT 1145 08.21.24 D. BLU Performed at [facility], [address], [place], [place] [address]    Lactic Acid, Venous 07/28/2023 2.1 (HH)  0.5 - 1.9 mmol/L Final   Comment: CRITICAL RESULT CALLED TO, READ BACK BY AND VERIFIED WITH T, MORRISON RN @1854  ON 07/28/23 BY MAB MT Performed at [facility], [address], [place], [place] [address]    Troponin I (High Sensitivity) 07/28/2023 4  <18 ng/L Final   Comment: (NOTE) Elevated high sensitivity troponin I (hsTnI) values and significant  changes across serial measurements may suggest ACS but many other  chronic and acute conditions are known to elevate hsTnI results.  Refer to the "Links" section for chest pain algorithms and additional  guidance. Performed at [facility], [address] [place], [place] [address]    B Natriuretic Peptide 07/28/2023 15.0  0.0 - 100.0 pg/mL Final   Performed at [facility], [address], [place], [place] [address]   Specimen Source 07/28/2023 URINE,  CLEAN CATCH   Final   Color, Urine 07/28/2023 YELLOW  YELLOW Final   APPearance 07/28/2023 CLEAR  CLEAR Final   Specific Gravity, Urine 07/28/2023 1.016  1.005 - 1.030 Final   pH 07/28/2023 6.0  5.0 - 8.0 Final   Glucose, UA 07/28/2023 50 (A)  NEGATIVE mg/dL Final   Hgb urine dipstick 07/28/2023 NEGATIVE  NEGATIVE Final   Bilirubin Urine 07/28/2023 NEGATIVE  NEGATIVE Final   Ketones, ur 07/28/2023 NEGATIVE  NEGATIVE mg/dL Final   Protein, ur 86/57/8469 NEGATIVE  NEGATIVE mg/dL Final   Nitrite 62/95/2841 NEGATIVE  NEGATIVE Final   Leukocytes,Ua 07/28/2023 NEGATIVE  NEGATIVE Final   RBC / HPF 07/28/2023 0-5  0 - 5 RBC/hpf Final   WBC, UA 07/28/2023 0-5  0 - 5 WBC/hpf Final   Comment:        Reflex urine culture not performed if WBC <=10, OR if Squamous epithelial cells >5. If Squamous epithelial cells >5 suggest recollection.    Bacteria, UA 07/28/2023 NONE SEEN  NONE SEEN Final   Squamous Epithelial / HPF 07/28/2023 0-5  0 - 5 /HPF Final   Performed at [facility], [address] [place], [place] [address]   Troponin I (High Sensitivity) 07/28/2023 6  <18 ng/L Final   Comment: (NOTE) Elevated high sensitivity troponin I (hsTnI) values and significant  changes across serial measurements may suggest ACS but many other  chronic and acute conditions are known to elevate hsTnI results.  Refer to the "Links" section for chest pain algorithms and additional  guidance. Performed at [facility], [address] [place], [place] [address]      X-Rays:CT Angio Chest PE W and/or Wo Contrast  Result Date: 08/19/2023 CLINICAL DATA:  Recent COVID infection with worsening chills, cough, shortness of breath and pleuritic chest pain associated with tachycardia EXAM: CT ANGIOGRAPHY CHEST WITH CONTRAST TECHNIQUE: Multidetector CT imaging of the chest was performed using the standard protocol during bolus administration of intravenous contrast. Multiplanar CT image reconstructions and  MIPs were obtained to evaluate the vascular anatomy. RADIATION DOSE REDUCTION: This exam was performed according to the departmental dose-optimization program which includes automated exposure control, adjustment of the mA and/or kV according to patient size and/or use of iterative reconstruction technique. CONTRAST:  60mL OMNIPAQUE IOHEXOL 350 MG/ML SOLN COMPARISON:  CT cardiac dated 10/24/2020, chest radiograph dated 08/19/2023 FINDINGS: Cardiovascular: The study is acceptable for the evaluation of pulmonary embolism. There are no filling defects in the central, lobar, segmental or subsegmental pulmonary artery branches to suggest acute pulmonary embolism. Great vessels are normal in course and caliber. Normal heart size. No significant pericardial fluid/thickening. Mediastinum/Nodes: Imaged thyroid gland without nodules meeting criteria for imaging follow-up by size. Normal esophagus. No pathologically enlarged axillary, supraclavicular, mediastinal, or hilar lymph nodes. Lungs/Pleura: The central airways are patent. AP luminal narrowing of the trachea. Subsegmental right lower lobe atelectasis. No pneumothorax. No pleural effusion. Upper abdomen: Cholecystectomy. Musculoskeletal: No acute or abnormal lytic or blastic osseous lesions. Multilevel degenerative changes of the thoracic spine. Review of the MIP images confirms the above findings. IMPRESSION: 1. No evidence of pulmonary embolism. 2. AP luminal narrowing of the trachea, which can be seen in the setting of  tracheomalacia. Electronically Signed   By: Agustin Cree M.D.   On: 08/19/2023 19:27   DG Chest 2 View  Result Date: 08/19/2023 CLINICAL DATA:  Chest pain radiating to the back associated with shortness of breath and productive cough EXAM: CHEST - 2 VIEW COMPARISON:  Chest radiograph dated 07/28/2023 FINDINGS: Low lung volumes with bronchovascular crowding. Left basilar linear opacities. No pleural effusion or pneumothorax. The heart size and  mediastinal contours are within normal limits. Cervical spinal fixation hardware appears intact. IMPRESSION: Low lung volumes with left basilar atelectasis. Electronically Signed   By: Agustin Cree M.D.   On: 08/19/2023 19:19    EKG: Orders placed or performed during the hospital encounter of 08/19/23   EKG 12-Lead   EKG 12-Lead   EKG   EKG     Hospital Course: Nicholas [name] is a 62 y.o. who was admitted to Hospital. They were brought to the operating room on 09/03/2023 and underwent Procedure(s): SHOULDER ARTHROSCOPY WITH DISTAL CLAVICLE RESECTION, SUBACROMIAL DECOMPRESSION, EXTENSIVE DEBRIDEMENT.  Patient tolerated the procedure well and was later transferred to the recovery room and then to the orthopaedic floor for postoperative care.  They were given PO and IV analgesics for pain control following their surgery.  They were given 24 hours of postoperative antibiotics of  Anti-infectives (From admission, onward)    Start     Dose/Rate Route Frequency Ordered Stop   09/03/23 1900  ceFAZolin (ANCEF) IVPB 2g/100 mL premix        2 g 200 mL/hr over 30 Minutes Intravenous Every 6 hours 09/03/23 1642 09/04/23 0651   09/03/23 1100  ceFAZolin (ANCEF) IVPB 2g/100 mL premix        2 g 200 mL/hr over 30 Minutes Intravenous On call to O.R. 09/03/23 1056 09/03/23 1706      and started on DVT prophylaxis in the form of  plavix .   Patient had no issues over night.  Patient was seen in rounds and was ready to go home.   Diet: Regular diet Activity:WBAT Follow-up:in 2 weeks Disposition - Home Discharged Condition: good    Allergies as of 09/04/2023       Reactions   Finasteride Other (See Comments)   Break out   Levothyroxine Anaphylaxis, Cough   Chronic cough   Nsaids Other (See Comments)   D/t gastric ulcer   Prednisone Itching, Other (See Comments), Anaphylaxis   Throat swelling and cough   Amoxicillin Itching, Other (See Comments)   THRUSH Causes sores in mouth   Ampicillin Other  (See Comments)   THRUSH   Penicillins Itching, Other (See Comments)   THRUSH- Causes sores in mouth   Strawberry Extract Swelling   LIPS SWELL   Asa [aspirin] Other (See Comments)   Bleeding   Bactrim [sulfamethoxazole-trimethoprim] Hives, Itching, Other (See Comments)   GI upset   Dapagliflozin    Other reaction(s): Other (See Comments) Allergic to steroids and had mouth broke out.    Depakote [divalproex Sodium]    Causes double vision and speech problems    Dilantin [phenytoin] Other (See Comments)   Severe skin peeling   Methocarbamol Other (See Comments)   dizziness   Other    Med for prostate that startes with a M- Broke out the insid eof mouth, split the tongue, and caused throudh   Risperidone And Related Other (See Comments)   Hallucinations   Sitagliptin Other (See Comments)   Pancreatitis   Strawberry (diagnostic) Itching, Swelling   Sulfa Antibiotics  Other (See Comments)   Affects thyroid   Tolmetin Other (See Comments)   D/t gastric ulcer   Ultram [tramadol Hcl] Other (See Comments)   D/t gastric ulcer   Buprenorphine Itching, Rash, Other (See Comments)   "Patches broke me out in red patches-  caused a fever, also"   Oatmeal Hives, Other (See Comments)   White spots/sores in mouth        Medication List     TAKE these medications    acetaminophen 325 MG tablet Commonly known as: TYLENOL Take 2 tablets (650 mg total) by mouth every 6 (six) hours as needed for mild pain (or Fever >/= 101).   albuterol 108 (90 Base) MCG/ACT inhaler Commonly known as: VENTOLIN HFA Inhale 1-2 puffs into the lungs every 6 (six) hours as needed for wheezing or shortness of breath.   ammonium lactate 12 % lotion Commonly known as: LAC-HYDRIN Apply 1 Application topically 2 (two) times daily.   atorvastatin 80 MG tablet Commonly known as: LIPITOR Take 1 tablet (80 mg total) by mouth at bedtime. What changed:  when to take this reasons to take this   brimonidine 0.2  % ophthalmic solution Commonly known as: ALPHAGAN Place 1 drop into both eyes 2 (two) times daily.   budesonide-formoterol 80-4.5 MCG/ACT inhaler Commonly known as: SYMBICORT Inhale 2 puffs into the lungs daily.   clopidogrel 75 MG tablet Commonly known as: PLAVIX TAKE 1 TABLET(75 MG) BY MOUTH DAILY   cyclobenzaprine 5 MG tablet Commonly known as: FLEXERIL Take 1 tablet (5 mg total) by mouth every 8 (eight) hours as needed for muscle spasms   diclofenac Sodium 1 % Gel Commonly known as: Voltaren Arthritis Pain Apply 2 g topically 4 (four) times daily.   Eszopiclone 3 MG Tabs Take 1 tablet (3 mg total) by mouth at bedtime.   fenofibrate 145 MG tablet Commonly known as: TRICOR TAKE 1 TABLET(145 MG) BY MOUTH DAILY   gabapentin 600 MG tablet Commonly known as: NEURONTIN Take 1 tablet (600 mg total) by mouth daily at 6 (six) AM. What changed: when to take this   insulin aspart 100 UNIT/ML injection Commonly known as: novoLOG Inject 6-16 Units into the skin 3 (three) times daily with meals. Sliding scale If blood is over 150   lactulose 10 GM/15ML solution Commonly known as: CHRONULAC Take 45 mLs (30 g total) by mouth daily as needed for moderate constipation.   latanoprost 0.005 % ophthalmic solution Commonly known as: XALATAN Place 1 drop into both eyes at bedtime.   lidocaine 4 % Commonly known as: HM Lidocaine Patch Place 1 patch onto the skin daily. What changed: additional instructions   lurasidone 40 MG Tabs tablet Commonly known as: LATUDA Take 40 mg by mouth at bedtime.   metFORMIN 1000 MG tablet Commonly known as: GLUCOPHAGE Take 1,000 mg by mouth 2 (two) times daily.   methocarbamol 750 MG tablet Commonly known as: Robaxin-750 Take 1 tablet (750 mg total) by mouth every 8 (eight) hours as needed for muscle spasms.   mupirocin ointment 2 % Commonly known as: BACTROBAN Apply 1 Application topically 3 (three) times daily.   nortriptyline 50 MG  capsule Commonly known as: PAMELOR Take 100 mg by mouth daily as needed (Sleep).   ondansetron 4 MG disintegrating tablet Commonly known as: ZOFRAN-ODT Take 1 tablet (4 mg total) by mouth every 8 (eight) hours as needed for nausea or vomiting.   ondansetron 4 MG tablet Commonly known as: Zofran Take 1 tablet (  4 mg total) by mouth every 8 (eight) hours as needed for vomiting or nausea.   oxyCODONE 5 MG immediate release tablet Commonly known as: Roxicodone Take 1 tablet (5 mg total) by mouth every 4 (four) hours as needed for severe pain or moderate pain.   rizatriptan 10 MG disintegrating tablet Commonly known as: MAXALT-MLT Take 1 tablet (10 mg total) by mouth as needed for migraine. May repeat in 2 hours if needed   Toujeo Max SoloStar 300 UNIT/ML Solostar Pen Generic drug: insulin glargine (2 Unit Dial) Inject 33 Units into the skin at bedtime.   traZODone 100 MG tablet Commonly known as: DESYREL Take 100 mg by mouth at bedtime.   verapamil 100 MG 24 hr capsule Commonly known as: VERELAN TAKE 1 CAPSULE(100 MG) BY MOUTH AT BEDTIME What changed:  how much to take how to take this when to take this additional instructions   zonisamide 100 MG capsule Commonly known as: ZONEGRAN Take 2 capsules twice a day        Follow-up Information     Yolonda Kida, MD Follow up in 2 week(s).   Specialty: Orthopedic Surgery Why: For wound re-check Contact information: [address] [place] [address] [place] [address] [phone]                 Signed: Dion Saucier PA-C  Orthopaedic Surgery 09/09/2023, 7:06 PM

## 2023-09-14 ENCOUNTER — Other Ambulatory Visit (HOSPITAL_COMMUNITY): Payer: Self-pay

## 2023-09-14 DIAGNOSIS — M25511 Pain in right shoulder: Secondary | ICD-10-CM | POA: Diagnosis not present

## 2023-09-14 MED ORDER — OXYCODONE-ACETAMINOPHEN 10-325 MG PO TABS
1.0000 | ORAL_TABLET | Freq: Four times a day (QID) | ORAL | 0 refills | Status: DC
Start: 2023-09-14 — End: 2023-09-20
  Filled 2023-09-14: qty 28, 7d supply, fill #0

## 2023-09-20 ENCOUNTER — Other Ambulatory Visit (HOSPITAL_COMMUNITY): Payer: Self-pay

## 2023-09-20 DIAGNOSIS — M25511 Pain in right shoulder: Secondary | ICD-10-CM | POA: Diagnosis not present

## 2023-09-20 MED ORDER — OXYCODONE-ACETAMINOPHEN 10-325 MG PO TABS
1.0000 | ORAL_TABLET | Freq: Four times a day (QID) | ORAL | 0 refills | Status: DC
Start: 2023-09-20 — End: 2023-09-27
  Filled 2023-09-20: qty 28, 7d supply, fill #0

## 2023-09-21 ENCOUNTER — Other Ambulatory Visit (HOSPITAL_COMMUNITY): Payer: Self-pay

## 2023-09-21 MED ORDER — CYCLOBENZAPRINE HCL 10 MG PO TABS
10.0000 mg | ORAL_TABLET | Freq: Three times a day (TID) | ORAL | 0 refills | Status: DC
  Filled 2023-09-21: qty 60, 20d supply, fill #0

## 2023-09-23 DIAGNOSIS — Z7984 Long term (current) use of oral hypoglycemic drugs: Secondary | ICD-10-CM | POA: Diagnosis not present

## 2023-09-23 DIAGNOSIS — Z794 Long term (current) use of insulin: Secondary | ICD-10-CM | POA: Diagnosis not present

## 2023-09-23 DIAGNOSIS — Z8616 Personal history of COVID-19: Secondary | ICD-10-CM | POA: Diagnosis not present

## 2023-09-23 DIAGNOSIS — E1165 Type 2 diabetes mellitus with hyperglycemia: Secondary | ICD-10-CM | POA: Diagnosis not present

## 2023-09-23 DIAGNOSIS — Z8673 Personal history of transient ischemic attack (TIA), and cerebral infarction without residual deficits: Secondary | ICD-10-CM | POA: Diagnosis not present

## 2023-09-23 DIAGNOSIS — M25511 Pain in right shoulder: Secondary | ICD-10-CM | POA: Diagnosis not present

## 2023-09-23 DIAGNOSIS — Z978 Presence of other specified devices: Secondary | ICD-10-CM | POA: Diagnosis not present

## 2023-09-27 ENCOUNTER — Other Ambulatory Visit (HOSPITAL_COMMUNITY): Payer: Self-pay

## 2023-09-27 MED ORDER — OXYCODONE-ACETAMINOPHEN 10-325 MG PO TABS
1.0000 | ORAL_TABLET | Freq: Four times a day (QID) | ORAL | 0 refills | Status: DC
Start: 2023-09-27 — End: 2023-10-04
  Filled 2023-09-27: qty 28, 7d supply, fill #0

## 2023-09-30 ENCOUNTER — Other Ambulatory Visit (HOSPITAL_COMMUNITY): Payer: Self-pay

## 2023-09-30 MED ORDER — OXYCODONE HCL 10 MG PO TABS
10.0000 mg | ORAL_TABLET | ORAL | 0 refills | Status: DC | PRN
Start: 2023-09-29 — End: 2023-10-12
  Filled 2023-09-30: qty 30, 5d supply, fill #0

## 2023-10-04 ENCOUNTER — Other Ambulatory Visit (HOSPITAL_COMMUNITY): Payer: Self-pay

## 2023-10-04 DIAGNOSIS — M25511 Pain in right shoulder: Secondary | ICD-10-CM | POA: Diagnosis not present

## 2023-10-04 MED ORDER — OXYCODONE-ACETAMINOPHEN 10-325 MG PO TABS
1.0000 | ORAL_TABLET | Freq: Four times a day (QID) | ORAL | 0 refills | Status: DC
Start: 2023-10-04 — End: 2023-10-18
  Filled 2023-10-04: qty 28, 7d supply, fill #0

## 2023-10-05 ENCOUNTER — Other Ambulatory Visit (HOSPITAL_COMMUNITY): Payer: Self-pay

## 2023-10-07 DIAGNOSIS — M25511 Pain in right shoulder: Secondary | ICD-10-CM | POA: Diagnosis not present

## 2023-10-08 DIAGNOSIS — R32 Unspecified urinary incontinence: Secondary | ICD-10-CM | POA: Diagnosis not present

## 2023-10-08 DIAGNOSIS — H5203 Hypermetropia, bilateral: Secondary | ICD-10-CM | POA: Diagnosis not present

## 2023-10-08 DIAGNOSIS — H401134 Primary open-angle glaucoma, bilateral, indeterminate stage: Secondary | ICD-10-CM | POA: Diagnosis not present

## 2023-10-08 DIAGNOSIS — H52223 Regular astigmatism, bilateral: Secondary | ICD-10-CM | POA: Diagnosis not present

## 2023-10-08 DIAGNOSIS — H524 Presbyopia: Secondary | ICD-10-CM | POA: Diagnosis not present

## 2023-10-18 ENCOUNTER — Other Ambulatory Visit (HOSPITAL_COMMUNITY): Payer: Self-pay

## 2023-10-18 DIAGNOSIS — H524 Presbyopia: Secondary | ICD-10-CM | POA: Diagnosis not present

## 2023-10-18 MED ORDER — OXYCODONE-ACETAMINOPHEN 10-325 MG PO TABS
1.0000 | ORAL_TABLET | Freq: Four times a day (QID) | ORAL | 0 refills | Status: DC
Start: 2023-10-18 — End: 2023-10-26
  Filled 2023-10-18: qty 28, 7d supply, fill #0

## 2023-10-19 ENCOUNTER — Other Ambulatory Visit (HOSPITAL_COMMUNITY): Payer: Self-pay

## 2023-10-19 DIAGNOSIS — M25511 Pain in right shoulder: Secondary | ICD-10-CM | POA: Diagnosis not present

## 2023-10-19 DIAGNOSIS — R6889 Other general symptoms and signs: Secondary | ICD-10-CM | POA: Diagnosis not present

## 2023-10-19 MED ORDER — CYCLOBENZAPRINE HCL 10 MG PO TABS
10.0000 mg | ORAL_TABLET | Freq: Three times a day (TID) | ORAL | 0 refills | Status: DC
  Filled 2023-10-19: qty 60, 20d supply, fill #0

## 2023-10-21 DIAGNOSIS — R6889 Other general symptoms and signs: Secondary | ICD-10-CM | POA: Diagnosis not present

## 2023-10-21 DIAGNOSIS — Z4789 Encounter for other orthopedic aftercare: Secondary | ICD-10-CM | POA: Diagnosis not present

## 2023-10-26 ENCOUNTER — Other Ambulatory Visit (HOSPITAL_COMMUNITY): Payer: Self-pay

## 2023-10-26 DIAGNOSIS — R6889 Other general symptoms and signs: Secondary | ICD-10-CM | POA: Diagnosis not present

## 2023-10-26 MED ORDER — OXYCODONE-ACETAMINOPHEN 10-325 MG PO TABS
1.0000 | ORAL_TABLET | Freq: Four times a day (QID) | ORAL | 0 refills | Status: DC
  Filled 2023-10-26 – 2023-11-09 (×4): qty 28, 7d supply, fill #0

## 2023-10-28 DIAGNOSIS — R6889 Other general symptoms and signs: Secondary | ICD-10-CM | POA: Diagnosis not present

## 2023-10-28 DIAGNOSIS — M25511 Pain in right shoulder: Secondary | ICD-10-CM | POA: Diagnosis not present

## 2023-11-02 ENCOUNTER — Other Ambulatory Visit (HOSPITAL_COMMUNITY): Payer: Self-pay

## 2023-11-02 DIAGNOSIS — M25511 Pain in right shoulder: Secondary | ICD-10-CM | POA: Diagnosis not present

## 2023-11-02 DIAGNOSIS — E119 Type 2 diabetes mellitus without complications: Secondary | ICD-10-CM | POA: Diagnosis not present

## 2023-11-02 MED ORDER — OXYCODONE-ACETAMINOPHEN 10-325 MG PO TABS
1.0000 | ORAL_TABLET | Freq: Four times a day (QID) | ORAL | 0 refills | Status: DC
Start: 2023-11-02 — End: 2024-10-13
  Filled 2023-11-02: qty 28, 7d supply, fill #0

## 2023-11-03 DIAGNOSIS — Z8673 Personal history of transient ischemic attack (TIA), and cerebral infarction without residual deficits: Secondary | ICD-10-CM | POA: Diagnosis not present

## 2023-11-03 DIAGNOSIS — E119 Type 2 diabetes mellitus without complications: Secondary | ICD-10-CM | POA: Diagnosis not present

## 2023-11-03 DIAGNOSIS — J45909 Unspecified asthma, uncomplicated: Secondary | ICD-10-CM | POA: Diagnosis not present

## 2023-11-03 DIAGNOSIS — L03011 Cellulitis of right finger: Secondary | ICD-10-CM | POA: Diagnosis not present

## 2023-11-03 DIAGNOSIS — L609 Nail disorder, unspecified: Secondary | ICD-10-CM | POA: Diagnosis not present

## 2023-11-05 ENCOUNTER — Other Ambulatory Visit (HOSPITAL_COMMUNITY): Payer: Self-pay

## 2023-11-09 ENCOUNTER — Other Ambulatory Visit (HOSPITAL_COMMUNITY): Payer: Self-pay

## 2023-11-09 ENCOUNTER — Other Ambulatory Visit: Payer: Self-pay

## 2023-11-09 DIAGNOSIS — M25511 Pain in right shoulder: Secondary | ICD-10-CM | POA: Diagnosis not present

## 2023-11-09 DIAGNOSIS — R6889 Other general symptoms and signs: Secondary | ICD-10-CM | POA: Diagnosis not present

## 2023-11-09 MED ORDER — OXYCODONE-ACETAMINOPHEN 10-325 MG PO TABS
1.0000 | ORAL_TABLET | Freq: Four times a day (QID) | ORAL | 0 refills | Status: DC
  Filled 2023-11-09 – 2024-01-14 (×2): qty 28, 7d supply, fill #0

## 2023-11-16 ENCOUNTER — Other Ambulatory Visit (HOSPITAL_COMMUNITY): Payer: Self-pay

## 2023-11-16 DIAGNOSIS — M25511 Pain in right shoulder: Secondary | ICD-10-CM | POA: Diagnosis not present

## 2023-11-16 DIAGNOSIS — R6889 Other general symptoms and signs: Secondary | ICD-10-CM | POA: Diagnosis not present

## 2023-11-16 MED ORDER — OXYCODONE-ACETAMINOPHEN 10-325 MG PO TABS
1.0000 | ORAL_TABLET | Freq: Four times a day (QID) | ORAL | 0 refills | Status: DC
  Filled 2023-11-16: qty 28, 7d supply, fill #0

## 2023-11-17 ENCOUNTER — Other Ambulatory Visit: Payer: Self-pay | Admitting: Family Medicine

## 2023-11-17 DIAGNOSIS — E785 Hyperlipidemia, unspecified: Secondary | ICD-10-CM

## 2023-11-18 ENCOUNTER — Other Ambulatory Visit (HOSPITAL_COMMUNITY): Payer: Self-pay

## 2023-11-22 ENCOUNTER — Other Ambulatory Visit: Payer: Self-pay

## 2023-11-22 ENCOUNTER — Other Ambulatory Visit (HOSPITAL_COMMUNITY): Payer: Self-pay

## 2023-11-22 MED ORDER — OXYCODONE-ACETAMINOPHEN 10-325 MG PO TABS
1.0000 | ORAL_TABLET | Freq: Four times a day (QID) | ORAL | 0 refills | Status: DC
  Filled 2023-11-22: qty 60, 15d supply, fill #0

## 2023-12-07 DIAGNOSIS — R6889 Other general symptoms and signs: Secondary | ICD-10-CM | POA: Diagnosis not present

## 2023-12-13 ENCOUNTER — Ambulatory Visit: Payer: Medicare HMO | Admitting: Neurology

## 2023-12-13 ENCOUNTER — Encounter: Payer: Self-pay | Admitting: Neurology

## 2023-12-15 ENCOUNTER — Other Ambulatory Visit (HOSPITAL_COMMUNITY): Payer: Self-pay

## 2023-12-15 MED ORDER — OXYCODONE-ACETAMINOPHEN 10-325 MG PO TABS
1.0000 | ORAL_TABLET | Freq: Four times a day (QID) | ORAL | 0 refills | Status: DC
  Filled 2023-12-15: qty 60, 15d supply, fill #0

## 2023-12-16 ENCOUNTER — Other Ambulatory Visit (HOSPITAL_COMMUNITY): Payer: Self-pay

## 2023-12-16 MED ORDER — CYCLOBENZAPRINE HCL 5 MG PO TABS
5.0000 mg | ORAL_TABLET | Freq: Three times a day (TID) | ORAL | 1 refills | Status: DC | PRN
  Filled 2023-12-16: qty 30, 10d supply, fill #0

## 2023-12-21 ENCOUNTER — Telehealth: Payer: Self-pay

## 2023-12-21 NOTE — Telephone Encounter (Signed)
 Pt called in because he missed his appointment he was upset he wanted an ASAP appointment with Dr Georjean. The front told him that 1st appointment was Aug 11th pt was very aggressive,  pt stated that he has not had medication since aug of last year his medication was sent in in September of last  year.  pt was advised that we sent him a mychart message about a new medication he dose not use mychart  I apologized that he was not called that message was just sent. Pt stated that he moved and changed his number. And that he did not know about his appointment on 12/13/23. Pt was advised that we call and they they get text about appointments. Pt stated that his number had not been updated, pt informed that he should of updated his number, he said that was his fault he did not change his number. Pt is scheduled for Aug 11th pt is asking if we will refill his rizatriptan  and zonisamide . Pt is asking to put on wait list as well he will need a 5 day in advance notice for transportation

## 2023-12-21 NOTE — Telephone Encounter (Signed)
 Ok for refills on his medications until August, ok to put on cancellation list, thanks

## 2023-12-22 MED ORDER — RIZATRIPTAN BENZOATE 10 MG PO TBDP: 10.0000 mg | ORAL_TABLET | ORAL | 6 refills | Status: DC | PRN

## 2023-12-22 MED ORDER — ZONISAMIDE 100 MG PO CAPS: ORAL_CAPSULE | ORAL | 6 refills | Status: DC

## 2023-12-22 NOTE — Telephone Encounter (Signed)
 Refills sent in for pt to last until his appointment

## 2023-12-31 ENCOUNTER — Other Ambulatory Visit (HOSPITAL_COMMUNITY): Payer: Self-pay

## 2023-12-31 MED ORDER — OXYCODONE-ACETAMINOPHEN 10-325 MG PO TABS
1.0000 | ORAL_TABLET | Freq: Four times a day (QID) | ORAL | 0 refills | Status: DC
  Filled 2023-12-31: qty 60, 15d supply, fill #0

## 2024-01-12 ENCOUNTER — Other Ambulatory Visit (HOSPITAL_COMMUNITY): Payer: Self-pay

## 2024-01-12 MED ORDER — OXYCODONE-ACETAMINOPHEN 10-325 MG PO TABS
1.0000 | ORAL_TABLET | Freq: Four times a day (QID) | ORAL | 0 refills | Status: DC
  Filled 2024-01-31 – 2024-04-06 (×2): qty 60, 15d supply, fill #0

## 2024-01-13 ENCOUNTER — Other Ambulatory Visit: Payer: Self-pay

## 2024-01-13 ENCOUNTER — Other Ambulatory Visit (HOSPITAL_COMMUNITY): Payer: Self-pay

## 2024-01-13 MED ORDER — CYCLOBENZAPRINE HCL 10 MG PO TABS
10.0000 mg | ORAL_TABLET | Freq: Three times a day (TID) | ORAL | 0 refills | Status: DC
  Filled 2024-01-13: qty 60, 20d supply, fill #0

## 2024-01-13 MED ORDER — PREDNISONE 10 MG (21) PO TBPK
ORAL_TABLET | ORAL | 0 refills | Status: DC
  Filled 2024-01-13 – 2024-02-09 (×2): qty 21, 6d supply, fill #0

## 2024-01-14 ENCOUNTER — Other Ambulatory Visit (HOSPITAL_COMMUNITY): Payer: Self-pay

## 2024-01-14 ENCOUNTER — Other Ambulatory Visit: Payer: Self-pay

## 2024-01-25 ENCOUNTER — Other Ambulatory Visit: Payer: Self-pay

## 2024-01-25 ENCOUNTER — Other Ambulatory Visit (HOSPITAL_COMMUNITY): Payer: Self-pay

## 2024-01-25 MED ORDER — OXYCODONE-ACETAMINOPHEN 10-325 MG PO TABS
1.0000 | ORAL_TABLET | Freq: Four times a day (QID) | ORAL | 0 refills | Status: DC
  Filled 2024-01-25: qty 60, 15d supply, fill #0

## 2024-01-27 ENCOUNTER — Other Ambulatory Visit (HOSPITAL_COMMUNITY): Payer: Self-pay

## 2024-01-27 ENCOUNTER — Other Ambulatory Visit: Payer: Self-pay

## 2024-01-28 ENCOUNTER — Other Ambulatory Visit (HOSPITAL_COMMUNITY): Payer: Self-pay

## 2024-01-28 MED ORDER — OXYCODONE-ACETAMINOPHEN 10-325 MG PO TABS
1.0000 | ORAL_TABLET | Freq: Four times a day (QID) | ORAL | 0 refills | Status: DC
  Filled 2024-01-31: qty 60, 15d supply, fill #0

## 2024-01-31 ENCOUNTER — Other Ambulatory Visit: Payer: Self-pay

## 2024-01-31 ENCOUNTER — Other Ambulatory Visit (HOSPITAL_COMMUNITY): Payer: Self-pay

## 2024-02-07 ENCOUNTER — Other Ambulatory Visit (HOSPITAL_COMMUNITY): Payer: Self-pay

## 2024-02-07 ENCOUNTER — Other Ambulatory Visit (HOSPITAL_BASED_OUTPATIENT_CLINIC_OR_DEPARTMENT_OTHER): Payer: Self-pay

## 2024-02-07 MED ORDER — CYCLOBENZAPRINE HCL 10 MG PO TABS
10.0000 mg | ORAL_TABLET | Freq: Three times a day (TID) | ORAL | 0 refills | Status: DC
  Filled 2024-02-07: qty 60, 20d supply, fill #0

## 2024-02-09 ENCOUNTER — Other Ambulatory Visit (HOSPITAL_COMMUNITY): Payer: Self-pay

## 2024-02-10 ENCOUNTER — Other Ambulatory Visit: Payer: Self-pay

## 2024-02-10 ENCOUNTER — Other Ambulatory Visit (HOSPITAL_BASED_OUTPATIENT_CLINIC_OR_DEPARTMENT_OTHER): Payer: Self-pay

## 2024-02-10 ENCOUNTER — Encounter: Payer: Self-pay | Admitting: Pharmacist

## 2024-02-22 ENCOUNTER — Other Ambulatory Visit (HOSPITAL_COMMUNITY): Payer: Self-pay

## 2024-02-22 ENCOUNTER — Other Ambulatory Visit: Payer: Self-pay

## 2024-02-22 MED ORDER — OXYCODONE-ACETAMINOPHEN 10-325 MG PO TABS
1.0000 | ORAL_TABLET | Freq: Four times a day (QID) | ORAL | 0 refills | Status: DC
  Filled 2024-02-22: qty 60, 15d supply, fill #0

## 2024-02-28 ENCOUNTER — Other Ambulatory Visit (HOSPITAL_COMMUNITY): Payer: Self-pay

## 2024-02-28 MED ORDER — CYCLOBENZAPRINE HCL 10 MG PO TABS
10.0000 mg | ORAL_TABLET | Freq: Three times a day (TID) | ORAL | 0 refills | Status: DC
  Filled 2024-02-28: qty 60, 20d supply, fill #0

## 2024-03-07 ENCOUNTER — Other Ambulatory Visit (HOSPITAL_COMMUNITY): Payer: Self-pay

## 2024-03-07 MED ORDER — OXYCODONE-ACETAMINOPHEN 10-325 MG PO TABS
1.0000 | ORAL_TABLET | Freq: Four times a day (QID) | ORAL | 0 refills | Status: DC
Start: 2024-03-07 — End: 2024-10-13
  Filled 2024-03-07: qty 60, 15d supply, fill #0

## 2024-03-21 ENCOUNTER — Other Ambulatory Visit (HOSPITAL_COMMUNITY): Payer: Self-pay

## 2024-03-21 ENCOUNTER — Other Ambulatory Visit: Payer: Self-pay

## 2024-03-21 MED ORDER — OXYCODONE-ACETAMINOPHEN 10-325 MG PO TABS
1.0000 | ORAL_TABLET | Freq: Four times a day (QID) | ORAL | 0 refills | Status: DC
  Filled 2024-03-21: qty 60, 15d supply, fill #0

## 2024-03-27 ENCOUNTER — Other Ambulatory Visit: Payer: Self-pay

## 2024-03-27 ENCOUNTER — Other Ambulatory Visit (HOSPITAL_COMMUNITY): Payer: Self-pay

## 2024-03-27 MED ORDER — CYCLOBENZAPRINE HCL 10 MG PO TABS
10.0000 mg | ORAL_TABLET | Freq: Three times a day (TID) | ORAL | 1 refills | Status: DC
  Filled 2024-03-27: qty 60, 20d supply, fill #0
  Filled 2024-04-17: qty 60, 20d supply, fill #1

## 2024-04-04 ENCOUNTER — Other Ambulatory Visit (HOSPITAL_COMMUNITY): Payer: Self-pay

## 2024-04-04 MED ORDER — OXYCODONE-ACETAMINOPHEN 10-325 MG PO TABS: 1.0000 | ORAL_TABLET | Freq: Four times a day (QID) | ORAL | 0 refills | Status: DC

## 2024-04-05 ENCOUNTER — Other Ambulatory Visit (HOSPITAL_COMMUNITY): Payer: Self-pay

## 2024-04-06 ENCOUNTER — Other Ambulatory Visit (HOSPITAL_COMMUNITY): Payer: Self-pay

## 2024-04-07 ENCOUNTER — Other Ambulatory Visit: Payer: Self-pay

## 2024-04-17 ENCOUNTER — Other Ambulatory Visit (HOSPITAL_COMMUNITY): Payer: Self-pay

## 2024-04-25 ENCOUNTER — Other Ambulatory Visit (HOSPITAL_COMMUNITY): Payer: Self-pay

## 2024-04-25 MED ORDER — OXYCODONE-ACETAMINOPHEN 10-325 MG PO TABS
1.0000 | ORAL_TABLET | Freq: Four times a day (QID) | ORAL | 0 refills | Status: DC
  Filled 2024-04-25: qty 60, 15d supply, fill #0

## 2024-05-09 ENCOUNTER — Other Ambulatory Visit (HOSPITAL_COMMUNITY): Payer: Self-pay

## 2024-05-09 ENCOUNTER — Other Ambulatory Visit: Payer: Self-pay

## 2024-05-09 MED ORDER — OXYCODONE-ACETAMINOPHEN 10-325 MG PO TABS
ORAL_TABLET | ORAL | 0 refills | Status: DC
  Filled 2024-05-09: qty 60, 15d supply, fill #0

## 2024-05-12 ENCOUNTER — Other Ambulatory Visit (HOSPITAL_COMMUNITY): Payer: Self-pay

## 2024-05-12 MED ORDER — CYCLOBENZAPRINE HCL 10 MG PO TABS
10.0000 mg | ORAL_TABLET | Freq: Three times a day (TID) | ORAL | 1 refills | Status: DC
  Filled 2024-05-12: qty 60, 20d supply, fill #0

## 2024-05-18 ENCOUNTER — Other Ambulatory Visit (HOSPITAL_COMMUNITY): Payer: Self-pay

## 2024-05-18 MED ORDER — OXYCODONE-ACETAMINOPHEN 10-325 MG PO TABS
1.0000 | ORAL_TABLET | Freq: Four times a day (QID) | ORAL | 0 refills | Status: DC
  Filled 2024-05-18 – 2024-05-22 (×2): qty 60, 15d supply, fill #0

## 2024-05-22 ENCOUNTER — Other Ambulatory Visit (HOSPITAL_COMMUNITY): Payer: Self-pay

## 2024-05-23 ENCOUNTER — Other Ambulatory Visit: Payer: Self-pay

## 2024-05-31 ENCOUNTER — Other Ambulatory Visit (HOSPITAL_COMMUNITY): Payer: Self-pay

## 2024-05-31 MED ORDER — OXYCODONE-ACETAMINOPHEN 10-325 MG PO TABS
1.0000 | ORAL_TABLET | Freq: Four times a day (QID) | ORAL | 0 refills | Status: DC
  Filled 2024-06-05: qty 60, 15d supply, fill #0

## 2024-06-02 ENCOUNTER — Other Ambulatory Visit (HOSPITAL_COMMUNITY): Payer: Self-pay

## 2024-06-05 ENCOUNTER — Other Ambulatory Visit: Payer: Self-pay

## 2024-06-05 ENCOUNTER — Other Ambulatory Visit (HOSPITAL_COMMUNITY): Payer: Self-pay

## 2024-06-20 ENCOUNTER — Other Ambulatory Visit (HOSPITAL_COMMUNITY): Payer: Self-pay

## 2024-06-20 ENCOUNTER — Other Ambulatory Visit: Payer: Self-pay

## 2024-06-20 MED ORDER — OXYCODONE-ACETAMINOPHEN 10-325 MG PO TABS
1.0000 | ORAL_TABLET | Freq: Four times a day (QID) | ORAL | 0 refills | Status: DC
  Filled 2024-08-01: qty 60, 15d supply, fill #0

## 2024-06-20 MED ORDER — OXYCODONE-ACETAMINOPHEN 10-325 MG PO TABS
1.0000 | ORAL_TABLET | Freq: Four times a day (QID) | ORAL | 0 refills | Status: DC
  Filled 2024-06-20: qty 60, 15d supply, fill #0

## 2024-06-21 ENCOUNTER — Other Ambulatory Visit (HOSPITAL_COMMUNITY): Payer: Self-pay

## 2024-07-04 ENCOUNTER — Other Ambulatory Visit (HOSPITAL_COMMUNITY): Payer: Self-pay

## 2024-07-04 ENCOUNTER — Other Ambulatory Visit: Payer: Self-pay

## 2024-07-04 MED ORDER — OXYCODONE-ACETAMINOPHEN 10-325 MG PO TABS
1.0000 | ORAL_TABLET | Freq: Four times a day (QID) | ORAL | 0 refills | Status: DC
  Filled 2024-07-04: qty 120, 30d supply, fill #0

## 2024-07-17 ENCOUNTER — Ambulatory Visit: Payer: 59 | Admitting: Neurology

## 2024-07-17 ENCOUNTER — Encounter: Payer: Self-pay | Admitting: Neurology

## 2024-07-24 ENCOUNTER — Other Ambulatory Visit: Payer: Self-pay

## 2024-07-24 ENCOUNTER — Other Ambulatory Visit (HOSPITAL_COMMUNITY): Payer: Self-pay

## 2024-07-24 MED ORDER — CYCLOBENZAPRINE HCL 10 MG PO TABS
10.0000 mg | ORAL_TABLET | Freq: Three times a day (TID) | ORAL | 1 refills | Status: DC
  Filled 2024-07-24: qty 60, 20d supply, fill #0
  Filled 2024-08-11 – 2024-08-12 (×2): qty 60, 20d supply, fill #1

## 2024-07-26 ENCOUNTER — Other Ambulatory Visit (HOSPITAL_COMMUNITY): Payer: Self-pay

## 2024-07-31 ENCOUNTER — Other Ambulatory Visit (HOSPITAL_COMMUNITY): Payer: Self-pay

## 2024-07-31 MED ORDER — OXYCODONE-ACETAMINOPHEN 10-325 MG PO TABS
1.0000 | ORAL_TABLET | Freq: Four times a day (QID) | ORAL | 0 refills | Status: DC
Start: 2024-07-31 — End: 2024-10-13

## 2024-08-01 ENCOUNTER — Other Ambulatory Visit (HOSPITAL_COMMUNITY): Payer: Self-pay

## 2024-08-01 ENCOUNTER — Other Ambulatory Visit: Payer: Self-pay

## 2024-08-01 MED ORDER — OXYCODONE-ACETAMINOPHEN 10-325 MG PO TABS
1.0000 | ORAL_TABLET | Freq: Four times a day (QID) | ORAL | 0 refills | Status: DC
  Filled 2024-08-01 – 2024-08-14 (×2): qty 120, 30d supply, fill #0

## 2024-08-03 ENCOUNTER — Other Ambulatory Visit (HOSPITAL_COMMUNITY): Payer: Self-pay

## 2024-08-09 ENCOUNTER — Other Ambulatory Visit (HOSPITAL_COMMUNITY): Payer: Self-pay

## 2024-08-09 MED ORDER — CYCLOBENZAPRINE HCL 5 MG PO TABS
5.0000 mg | ORAL_TABLET | Freq: Three times a day (TID) | ORAL | 1 refills | Status: DC | PRN
  Filled 2024-08-09 – 2024-08-11 (×2): qty 30, 10d supply, fill #0

## 2024-08-11 ENCOUNTER — Other Ambulatory Visit (HOSPITAL_COMMUNITY): Payer: Self-pay

## 2024-08-14 ENCOUNTER — Other Ambulatory Visit (HOSPITAL_COMMUNITY): Payer: Self-pay

## 2024-08-14 ENCOUNTER — Other Ambulatory Visit: Payer: Self-pay

## 2024-08-14 MED ORDER — OXYCODONE-ACETAMINOPHEN 10-325 MG PO TABS
1.0000 | ORAL_TABLET | Freq: Four times a day (QID) | ORAL | 0 refills | Status: DC | PRN
  Filled 2024-08-14: qty 120, 30d supply, fill #0

## 2024-08-15 ENCOUNTER — Other Ambulatory Visit (HOSPITAL_COMMUNITY): Payer: Self-pay

## 2024-08-15 ENCOUNTER — Other Ambulatory Visit: Payer: Self-pay

## 2024-09-11 ENCOUNTER — Telehealth: Payer: Self-pay

## 2024-09-11 NOTE — Telephone Encounter (Signed)
 Received [facility]  [place] surgical form, Patient needs to be seen. Faxed form will call patient to scheduled.paper sent to scan

## 2024-09-12 ENCOUNTER — Other Ambulatory Visit (HOSPITAL_COMMUNITY): Payer: Self-pay

## 2024-09-12 ENCOUNTER — Other Ambulatory Visit: Payer: Self-pay

## 2024-09-12 MED ORDER — CYCLOBENZAPRINE HCL 10 MG PO TABS
10.0000 mg | ORAL_TABLET | Freq: Three times a day (TID) | ORAL | 1 refills | Status: DC
  Filled 2024-09-12: qty 60, 20d supply, fill #0

## 2024-09-12 MED ORDER — OXYCODONE-ACETAMINOPHEN 10-325 MG PO TABS
1.0000 | ORAL_TABLET | Freq: Four times a day (QID) | ORAL | 0 refills | Status: DC
  Filled 2024-09-12: qty 120, 30d supply, fill #0

## 2024-09-22 ENCOUNTER — Other Ambulatory Visit: Payer: Self-pay

## 2024-09-22 ENCOUNTER — Telehealth: Payer: Self-pay | Admitting: Neurology

## 2024-09-22 MED ORDER — RIZATRIPTAN BENZOATE 10 MG PO TBDP: 10.0000 mg | ORAL_TABLET | ORAL | 6 refills | Status: DC | PRN

## 2024-09-22 MED ORDER — RIZATRIPTAN BENZOATE 10 MG PO TBDP: 10.0000 mg | ORAL_TABLET | ORAL | 6 refills | Status: AC | PRN

## 2024-09-22 MED ORDER — ZONISAMIDE 100 MG PO CAPS: ORAL_CAPSULE | ORAL | 6 refills | Status: AC

## 2024-09-22 MED ORDER — ZONISAMIDE 100 MG PO CAPS: ORAL_CAPSULE | ORAL | 6 refills | Status: DC

## 2024-09-22 NOTE — Telephone Encounter (Signed)
 Pt c/o: seizure Missed medications?  No. Sleep deprived?  Yes.   He said that he can not sleep  Alcohol intake?  No. Increased stress? Yes.   He brother passed away increased in stress  Any trigger? No was sleeping  Any change in medication color or shape? No. Back to their usual baseline self?  Yes.  . If no, advise go to ER Current medications prescribed by Dr. Georjean:  zonisamide  (ZONEGRAN ) 100 MG capsule Take 2 capsules twice a day   He went to [facility], after the seizure had scans done,  He is falling, has had increase in dizziness  PCP has stated paperwork to get a home CNA He is out of migraine medication ran out in September   Pt phone number is [phone]

## 2024-09-22 NOTE — Telephone Encounter (Signed)
 Pls let him know I got the ER notes from [facility], would continue on current medications. I sent in refills of the Zonisamide  and Maxalt . He missed his Jan appt and you talked to him about his number not being updated, but he also no-showed his August appt. Pls have him schedule a f/u, will need to see him for continued refills. Thanks

## 2024-09-22 NOTE — Telephone Encounter (Signed)
 Who's calling (name and relationship to patient) : Nicholas [name]; Self   Best contact number: [phone]  Provider they see: Dr. Georjean  Reason for call: He called in stating that he has not had seizures in a while, but recently had one that almost took him out. He is requesting a call a back asap.

## 2024-09-22 NOTE — Telephone Encounter (Signed)
 Pt called an informed that Dr Georjean  got the ER notes from [facility], would continue on current medications. Dr Georjean sent in refills of the Zonisamide  and Maxalt . He missed his Jan appt and I talked to him about his number not being updated, but he also no-showed his August appt. Pls have him schedule a f/u, will need to see him for continued refills.

## 2024-10-03 ENCOUNTER — Other Ambulatory Visit (HOSPITAL_COMMUNITY): Payer: Self-pay

## 2024-10-03 MED ORDER — CYCLOBENZAPRINE HCL 10 MG PO TABS
10.0000 mg | ORAL_TABLET | Freq: Three times a day (TID) | ORAL | 1 refills | Status: DC
  Filled 2024-10-03: qty 90, 30d supply, fill #0

## 2024-10-11 ENCOUNTER — Encounter (HOSPITAL_COMMUNITY): Payer: Self-pay | Admitting: Physician Assistant

## 2024-10-12 NOTE — Progress Notes (Signed)
 Scheduled a video visit with him for 11/6 to help with starting new sensor. He did confirm he is wearing his pump.

## 2024-10-13 ENCOUNTER — Other Ambulatory Visit (HOSPITAL_COMMUNITY): Payer: Self-pay

## 2024-10-13 ENCOUNTER — Ambulatory Visit: Attending: Cardiology | Admitting: Cardiology

## 2024-10-13 ENCOUNTER — Telehealth: Payer: Self-pay | Admitting: Cardiology

## 2024-10-13 ENCOUNTER — Encounter: Payer: Self-pay | Admitting: Cardiology

## 2024-10-13 VITALS — BP 124/81 | HR 76 | Ht 69.0 in | Wt 202.0 lb

## 2024-10-13 DIAGNOSIS — I10 Essential (primary) hypertension: Secondary | ICD-10-CM

## 2024-10-13 DIAGNOSIS — Z01818 Encounter for other preprocedural examination: Secondary | ICD-10-CM | POA: Insufficient documentation

## 2024-10-13 MED ORDER — OXYCODONE-ACETAMINOPHEN 10-325 MG PO TABS
1.0000 | ORAL_TABLET | Freq: Four times a day (QID) | ORAL | 0 refills | Status: DC
  Filled 2024-10-13: qty 120, 30d supply, fill #0

## 2024-10-13 MED ORDER — ASPIRIN 81 MG PO TBEC: 81.0000 mg | DELAYED_RELEASE_TABLET | Freq: Every day | ORAL | 1 refills | Status: DC

## 2024-10-13 NOTE — Patient Instructions (Signed)
 Medication Instructions:  Your physician has recommended you make the following change in your medication:  STOP: Plavix   START: Aspirin 81 mg once daily.  *If you need a refill on your cardiac medications before your next appointment, please call your pharmacy*  Follow-Up: At [name], you and your health needs are our priority.  As part of our continuing mission to provide you with exceptional heart care, our providers are all part of one team.  This team includes your primary Cardiologist (physician) and Advanced Practice Providers or APPs (Physician Assistants and Nurse Practitioners) who all work together to provide you with the care you need, when you need it.  Your next appointment:   As needed   Provider:   Newman JINNY Lawrence, MD

## 2024-10-13 NOTE — Progress Notes (Addendum)
 Cardiology Office Note:  .   Date:  10/13/2024  ID:  Nicholas [name], DOB 03-Nov-1961, MRN [id number] PCP: Sandor Madelin CROME, FNP  [facility] Providers Cardiologist:  Newman Lawrence, MD PCP: Sandor Madelin CROME, FNP  Chief Complaint  Patient presents with   Preop visit     Nicholas [name] is a 63 y.o. male with hypertension, type 2 diabetes mellitus, hypothyroidism,h/o gastric ulcer, bipolar disorder, conversion disorder, h/o seizures, h/o prior tobacco and cocaine abuse   Discussed the use of AI scribe software for clinical note transcription with the patient, who gave verbal consent to proceed.  History of Present Illness  Patient was last seen by me in 05/2023.  Recently, he has had neck pain for which he is going to undergo cervical spine surgery in the near future.  He is still able to walk with a cane without any complaints of chest pain or shortness of breath.  Previous cardiac workup included: CT angiogram with no coronary artery disease in 2021, echocardiogram in 2024 with improved LVEF of 60-65%.  Recent lipid panel results with the patient, details below.    Vitals:   10/13/24 0851  BP: 124/81  Pulse: 76  SpO2: 95%      Review of Systems  Cardiovascular:  Negative for chest pain, dyspnea on exertion, leg swelling, palpitations and syncope.        Studies Reviewed: SABRA        EKG 10/13/2024: Normal sinus rhythm Left axis deviation Minimal voltage criteria for LVH, may be normal variant ( R in aVL ) Possible Anterior infarct (cited on or before 28-Jul-2023) When compared with ECG of 19-Aug-2023 16:13, Vent. rate has decreased BY  46 BPM    Echocardiogram 2024: Normal LV systolic function with EF 64%. Mild concentric hypertrophy of  the left ventricle. Normal global wall motion. Left ventricle cavity is  normal in size. Doppler evidence of grade I (impaired) diastolic  dysfunction, normal LAP.  Mild (Grade I) mitral regurgitation.   No evidence of pulmonary hypertension.  Compared to previous study in 2021, LVEF has improved from 45-50%.   Exercise treadmill stress test 2024: Exercise treadmill stress test performed using Bruce protocol.  Patient exercised for a total of 6 minutes and 0 seconds, achieving 7.3 METS, and 70% of age predicted maximum heart rate.  Exercise capacity was fair.  6/10, non-limiting chest pain reported.  Attenuated heart rate response, possibly due to medication (verapamil ). Normal blood pressure response. Stress EKG at 70% MPHR revealed no ischemic changes. Inconclusive exercise treadmill stress test due suboptimal heart rate response.   Mobile cardiac telemetry 2023: Dominant rhythm: Sinus. HR 44-130 bpm. Avg HR 91 bpm. 1 episode of atrial tachycardia, fastest at 171 bpm for 5 beats. <1% isolated SVE, couplet/triplets. 0 episodes of VT. <1% isolated VE. No couplet/triplets. No atrial fibrillation/atrial flutter/VT/high grade AV block, sinus pause >3sec noted. 0 patient triggered events.    CCTA 2021: 1. Coronary calcium  score of 0. 2. Normal coronary origin with right dominance. 3. Main pulmonary artery is at the upper limit of normal, 29mm. 4. CAD-RADS = 0.   RECOMMENDATIONS: No evidence of CAD (0%). Consider non-atherosclerotic causes of chest pain.   Echocardiogram 2021:  1. Left ventricular ejection fraction, by estimation, is 45 to 50%. The  left ventricle has mildly decreased function. The left ventricle  demonstrates regional wall motion abnormalities (see scoring  diagram/findings for description). There is mild left  ventricular hypertrophy. Left ventricular diastolic parameters are  consistent with Grade I diastolic dysfunction (impaired relaxation). There  is moderate hypokinesis of the left ventricular, basal-mid inferolateral  wall and inferior wall suggesting  possible circumflex territory ischemia   2. Right ventricular systolic function is mildly reduced. The  right  ventricular size is normal. There is normal pulmonary artery systolic  pressure. The estimated right ventricular systolic pressure is 23.2 mmHg.   3. The mitral valve is abnormal. Trivial mitral valve regurgitation.   4. The aortic valve is tricuspid. Aortic valve regurgitation is not  visualized.   5. The inferior vena cava is normal in size with greater than 50%  respiratory variability, suggesting right atrial pressure of 3 mmHg.    Labs 09/2024: Chol 95, TG 93, HDL 29, LDL 48 HbA1C 7.4% Hb 13.8 Cr 1.48    Physical Exam Vitals and nursing note reviewed.  Constitutional:      General: He is not in acute distress. Neck:     Vascular: No JVD.  Cardiovascular:     Rate and Rhythm: Normal rate and regular rhythm.     Heart sounds: Normal heart sounds. No murmur heard. Pulmonary:     Effort: Pulmonary effort is normal.     Breath sounds: Normal breath sounds. No wheezing or rales.  Musculoskeletal:     Right lower leg: No edema.     Left lower leg: No edema.      VISIT DIAGNOSES:   ICD-10-CM   1. Essential hypertension  I10 EKG 12-Lead    2. Pre-op evaluation  Z01.818        Nicholas [name] is a 63 y.o. male with hypertension, type 2 diabetes mellitus, hypothyroidism, h/o gastric ulcer, bipolar disorder, conversion disorder, h/o seizures, former smoker Assessment & Plan  Preop risk stratification: Reassuring cardiac workup within last year and a half. No symptoms of angina at this time. Okay to proceed with upcoming surgery for cervical spine without any additional cardiac testing. Patient has questionable history of stroke in the past.  He has been on Plavix  for a long time.  Reasonable to switch to aspirin 81 mg daily.  This can be refilled with his PCP in future. He has remote history of gastric ulcer.  Monitor for any recurrent GI bleeding symptoms   Hypertension: Generally well controlled   Hyperlipidemia: Well-controlled.    Meds ordered this  encounter  Medications   aspirin EC 81 MG tablet    Sig: Take 1 tablet (81 mg total) by mouth daily. Swallow whole.    Dispense:  90 tablet    Refill:  1    Send renewal request to PCP     F/u as needed  Signed, Newman JINNY Lawrence, MD

## 2024-10-13 NOTE — Telephone Encounter (Signed)
 Patient stated he lost his black diabetic controller (which is the size cell phone) and wants to know if he left it in the exam room at this morning's appointment. Patient stated can call him if the equipment has been found.

## 2024-10-13 NOTE — Telephone Encounter (Signed)
 I will send message to RN who was working with Dr.Patwardhan this morning.

## 2024-10-16 ENCOUNTER — Other Ambulatory Visit (HOSPITAL_COMMUNITY): Payer: Self-pay

## 2024-10-17 ENCOUNTER — Telehealth: Payer: Self-pay | Admitting: Cardiology

## 2024-10-17 ENCOUNTER — Other Ambulatory Visit (HOSPITAL_COMMUNITY): Payer: Self-pay

## 2024-10-17 NOTE — Telephone Encounter (Signed)
 Pt calling in asking if preop clearance he dropped off has been completed and faxed over to [facility]

## 2024-10-18 NOTE — Telephone Encounter (Signed)
 Patient called to follow-up on a pre-op clearance form patient stated Dr. Elmira was to fax to [facility].

## 2024-10-18 NOTE — Telephone Encounter (Signed)
 See notes from preop APP Barnie Hila, NP call back to out to Dr. Burnetta office to request the surgery clearance.  Looks like per pt Dr. Burnetta. I believe this to be Dr. Donaciano Burnetta [facility] [phone].  The preop call back team will call tomorrow to obtain preop clearance request.

## 2024-10-18 NOTE — Telephone Encounter (Signed)
 Called and spoke to pt. He did NOT DROP off any documents/forms. He states that he saw Dr. Elmira on 10/13/24 and that he told him that he was cleared for surgery. Pt states Dr. Burnetta (Orthopedics) will be performing a procedure on his neck at [facility] on 11/06/2024.   Will route to pre op team.

## 2024-10-19 ENCOUNTER — Telehealth (HOSPITAL_BASED_OUTPATIENT_CLINIC_OR_DEPARTMENT_OTHER): Payer: Self-pay

## 2024-10-19 NOTE — Telephone Encounter (Signed)
 Late entry. Areas where pt was, was searched. Remote controller was not located.

## 2024-10-19 NOTE — Telephone Encounter (Signed)
 Left vm for surgery scheduler for Dr. Burnetta that we are needing a clearance request to be faxed to our office today ASAP for preop clearance and documentation.

## 2024-10-19 NOTE — Telephone Encounter (Signed)
   Pre-operative Risk Assessment    Patient Name: FLAVIUS REPSHER  DOB: 12-25-1960 MRN: [id number]   Date of last office visit: 10/13/2024 with Dr. Elmira Date of next office visit: N/A   Request for Surgical Clearance    Procedure:  Anterior cervical discectomy and fusion C3-4  Date of Surgery:  Clearance TBD                                 Surgeon:  Dr. Donaciano Sprang Surgeon's Group or Practice Name:  [facility]  Phone number:  [phone] or [phone] -Katheryn Dustman Fax number:  [phone]   Type of Clearance Requested:   - Medical  - Pharmacy:  Hold Plavix  ans Aspirin - Does not specify      Type of Anesthesia:  General    Additional requests/questions:  N/A    SignedPatrcia Iverson CROME   10/19/2024, 8:35 AM

## 2024-10-19 NOTE — Telephone Encounter (Signed)
 I am faxing these notes to DR. Burnetta as I believe the surgeon office is trying to fax the clearance request to our office but they are trying to fax to my direct line.    The correct fax # the preop request needs to be faxed to is [phone] preop team,

## 2024-10-19 NOTE — Telephone Encounter (Signed)
 Preoperative team, patient was recently seen by Dr. Elmira on 10/13/2024.  Surgical clearance was addressed at that time.  Please forward office note to requesting office/provider.  Thank you for your help.  Nicholas [name]. Nicholas Lewing NP-C     10/19/2024, 4:24 PM [facility] [address] [place], [place] [address] Office [phone]

## 2024-10-20 ENCOUNTER — Telehealth: Payer: Self-pay | Admitting: Cardiology

## 2024-10-20 NOTE — Telephone Encounter (Signed)
 Called patient back about message. Patient is agreeable to plan and stated he has plenty of plavix . Will update medication list.

## 2024-10-20 NOTE — Telephone Encounter (Signed)
 Bleeding is not true allergy. That said, if patient wants to go back to Plavix  that he was previously on instead of Aspirin, that is okay with me. It would need to be held for 5 days before his upcoming surgery. This can be refilled with his PCP going forward.  Thanks MJP

## 2024-10-20 NOTE — Telephone Encounter (Signed)
 Pt c/o medication issue:  1. Name of Medication:   aspirin EC 81 MG tablet    2. How are you currently taking this medication (dosage and times per day)?    3. Are you having a reaction (difficulty breathing--STAT)? no  4. What is your medication issue? Patient calling to say that he is allergic to the medication. Please advise

## 2024-10-20 NOTE — Telephone Encounter (Signed)
 Attempted to contact pt to discuss. No answer, left message I will forward this to MD and his RN for their knowledge.  ASA is listed in pt's record as an allergy with a comment listed bleeding.  Requested pt call back with any further questions or concerns.  Advised he will be contacted if there are any new orders from MD.

## 2024-10-20 NOTE — Telephone Encounter (Signed)
 Left message for pt to call back to discuss comments and recommendations from Dr Elmira.

## 2024-10-20 NOTE — Telephone Encounter (Signed)
 Pt returning nurse's call. Please advise

## 2024-10-23 ENCOUNTER — Telehealth: Payer: Self-pay | Admitting: Cardiology

## 2024-10-23 NOTE — Telephone Encounter (Signed)
 The protocol for our preop team, is ALL forms are entered into a form designed for our preop team and cardiologist for uniformity and continuity; as we have 7 satellite office makes it very difficult to not keep thing in uniform. Due to the size of our practice this process was developed 6+ yrs ago.   Our preop team is very precise in providing the preop APP and cardiologist with all the information from the requesting office. If the team has missed something we greatly apologize.   The practice does not keep the forms from any office as we enter all information into the chart directly.   I hope this explains better the process.    I will update the preop APP as to this added note as well.    Wilson, Jasmin B JW   10/23/24 11:03 AM Note Caller Nyla) stated the clearance form which addresses patient's Aspirin instructions needs to be re-faxed to them.       10/23/24 11:00 AM Mitzie Hard contacted Wilson, Jasmin B

## 2024-10-23 NOTE — Telephone Encounter (Signed)
 Caller Nyla) stated the clearance form which addresses patient's Aspirin instructions needs to be re-faxed to them.

## 2024-10-23 NOTE — Telephone Encounter (Signed)
 The protocol for our preop team, is ALL forms are entered into a form designed for our preop team and cardiologist for uniformity and continuity; as we have 7 satellite office makes it very difficult to not keep thing in uniform. Due to the size of our practice this process was developed 6+ yrs ago.   Our preop team is very precise in providing the preop APP and cardiologist with all the information from the requesting office. If the team has missed something we greatly apologize.   The practice does not keep the forms from any office as we enter all information into the chart directly.   I hope this explains better the process.

## 2024-10-24 ENCOUNTER — Other Ambulatory Visit: Payer: Self-pay

## 2024-10-24 ENCOUNTER — Other Ambulatory Visit (HOSPITAL_BASED_OUTPATIENT_CLINIC_OR_DEPARTMENT_OTHER): Payer: Self-pay

## 2024-10-24 NOTE — Telephone Encounter (Addendum)
 I have read notes from DR. Patwardhan 10/13/24. I read that he has changed pt to ASA and stopped Plavix  from what I have read in his notes.    I will update the surgeon office about the Plavix  stopped. I sent a secure chat to Katheryn RAMAN. Surgery scheduler as well. I will also call her in regard to the medication change. I left message for Katheryn to reply to my secure chat about ASA hold.    I will send this to preop to review about ASA recommendations.

## 2024-10-24 NOTE — Telephone Encounter (Signed)
   Name: Nicholas [name]  DOB: [birth year]  MRN: [id number]   Primary Cardiologist: Newman JINNY Lawrence, MD  Chart reviewed as part of pre-operative protocol coverage.   Per Dr. Lawrence: Preop risk stratification: Reassuring cardiac workup within last year and a half. No symptoms of angina at this time. Okay to proceed with upcoming surgery for cervical spine without any additional cardiac testing. Patient has questionable history of stroke in the past.  He has been on Plavix  for a long time.  Reasonable to switch to aspirin 81 mg daily.  This can be refilled with his PCP in future. He has remote history of gastric ulcer.  Monitor for any recurrent GI bleeding symptoms   Therefore, based on ACC/AHA guidelines, the patient would be at acceptable risk for the planned procedure without further cardiovascular testing.   May hold antiplatelet as needed.   I will route this recommendation to the requesting party via Epic fax function and remove from pre-op pool. Please call with questions.  [name] Garre Gwin Eagon, PA 10/24/2024, 10:18 AM

## 2024-10-24 NOTE — Telephone Encounter (Signed)
 Per [name] Hails, PAC she has update notes and have faxed them to the surgeon office.

## 2024-10-24 NOTE — Telephone Encounter (Signed)
 Lori from [facility] stated she's only received records, labs and office notes but they still need the clearance form faxed to her directly at [phone] because it needs to specify when he needs to come off his plavix  and aspirin. She stated she's aware that it's been sent twice but she still hasn't received it so maybe send it to the main fax number as well at [phone]. Please advise

## 2024-10-25 ENCOUNTER — Ambulatory Visit (HOSPITAL_COMMUNITY): Payer: Self-pay | Admitting: Physician Assistant

## 2024-11-01 ENCOUNTER — Other Ambulatory Visit: Payer: Self-pay

## 2024-11-01 ENCOUNTER — Other Ambulatory Visit (HOSPITAL_COMMUNITY): Payer: Self-pay

## 2024-11-01 MED ORDER — CYCLOBENZAPRINE HCL 10 MG PO TABS
10.0000 mg | ORAL_TABLET | Freq: Three times a day (TID) | ORAL | 1 refills | Status: DC
  Filled 2024-11-01: qty 90, 30d supply, fill #0

## 2024-11-10 ENCOUNTER — Other Ambulatory Visit (HOSPITAL_COMMUNITY): Payer: Self-pay

## 2024-11-10 MED ORDER — OXYCODONE-ACETAMINOPHEN 10-325 MG PO TABS
1.0000 | ORAL_TABLET | Freq: Four times a day (QID) | ORAL | 0 refills | Status: AC
  Filled 2024-11-14: qty 120, 30d supply, fill #0

## 2024-11-14 ENCOUNTER — Other Ambulatory Visit: Payer: Self-pay

## 2024-11-14 ENCOUNTER — Ambulatory Visit: Admitting: Neurology

## 2024-11-14 ENCOUNTER — Other Ambulatory Visit (HOSPITAL_COMMUNITY): Payer: Self-pay

## 2024-11-21 ENCOUNTER — Ambulatory Visit (HOSPITAL_COMMUNITY): Payer: Self-pay

## 2024-11-21 NOTE — Pre-Procedure Instructions (Signed)
 Surgical Instructions   Your procedure is scheduled on November 23, 2024. Report to [facility] Main Entrance A at 1:20 P.M., then check in with the Admitting office. Any questions or running late day of surgery: call [phone]  Questions prior to your surgery date: call [phone], Monday-Friday, 8am-4pm. If you experience any cold or flu symptoms such as cough, fever, chills, shortness of breath, etc. between now and your scheduled surgery, please notify us  at the above number.     Remember:  Do not eat after midnight the night before your surgery   You may drink clear liquids until 12:20 PM the afternoon of your surgery.   Clear liquids allowed are: Water, Non-Citrus Juices (without pulp), Carbonated Beverages, Clear Tea (no milk, honey, etc.), Black Coffee Only (NO MILK, CREAM OR POWDERED CREAMER of any kind), and Gatorade.    Take these medicines the morning of surgery with A SIP OF WATER: atorvastatin  (LIPITOR )  brimonidine  (ALPHAGAN ) ophthalmic solution  budesonide -formoterol  (SYMBICORT ) inhaler  ciprofloxacin  (CIPRO )  dicyclomine  (BENTYL )  doxycycline  (VIBRAMYCIN )  fenofibrate  (TRICOR )  gabapentin  (NEURONTIN )  linaclotide  (LINZESS )  pantoprazole  (PROTONIX )  solifenacin (VESICARE)  tamsulosin  (FLOMAX )  zonisamide  (ZONEGRAN )    May take these medicines IF NEEDED: albuterol  (PROVENTIL  HFA;VENTOLIN  HFA) 108 (90 Base) MCG/ACT inhaler  Baclofen  cyclobenzaprine  (FLEXERIL )  fluticasone  (FLONASE ) 50 MCG/ACT nasal spray  hydrOXYzine (ATARAX)  Lasmiditan Succinate (REYVOW)  methocarbamol  (ROBAXIN -750)  naloxone  (NARCAN ) nasal spray  ondansetron  (ZOFRAN )  oxyCODONE -acetaminophen  (PERCOCET)  rizatriptan  (MAXALT -MLT)  SUMAtriptan  (IMITREX )    One week prior to surgery, STOP taking any Aspirin  (unless otherwise instructed by your surgeon) Aleve, Naproxen, Ibuprofen , Motrin , Advil , Goody's, BC's, all herbal medications, fish oil, and non-prescription vitamins. This  includes your medication: diclofenac  Sodium (VOLTAREN  ARTHRITIS PAIN) GEL    WHAT DO I DO ABOUT MY DIABETES MEDICATION?   Do not take metFORMIN  (GLUCOPHAGE -XR) the morning of surgery.  STOP taking your FARXIGA  three days prior to surgery. Your last dose will be December 14th.      THE MORNING OF SURGERY, take 12 units of TOUJEO  MAX SOLOSTAR insulin .  If your CBG is greater than 220 mg/dL, you may take  of your sliding scale (correction) dose of insulin .   HOW TO MANAGE YOUR DIABETES BEFORE AND AFTER SURGERY  Why is it important to control my blood sugar before and after surgery? Improving blood sugar levels before and after surgery helps healing and can limit problems. A way of improving blood sugar control is eating a healthy diet by:  Eating less sugar and carbohydrates  Increasing activity/exercise  Talking with your doctor about reaching your blood sugar goals High blood sugars (greater than 180 mg/dL) can raise your risk of infections and slow your recovery, so you will need to focus on controlling your diabetes during the weeks before surgery. Make sure that the doctor who takes care of your diabetes knows about your planned surgery including the date and location.  How do I manage my blood sugar before surgery? Check your blood sugar at least 4 times a day, starting 2 days before surgery, to make sure that the level is not too high or low.  Check your blood sugar the morning of your surgery when you wake up and every 2 hours until you get to the Short Stay unit.  If your blood sugar is less than 70 mg/dL, you will need to treat for low blood sugar: Do not take insulin . Treat a low blood sugar (less than 70 mg/dL) with  cup of clear juice (cranberry or apple), 4 glucose tablets, OR glucose gel. Recheck blood sugar in 15 minutes after treatment (to make sure it is greater than 70 mg/dL). If your blood sugar is not greater than 70 mg/dL on recheck, call [phone] for  further instructions. Report your blood sugar to the short stay nurse when you get to Short Stay.  If you are admitted to the hospital after surgery: Your blood sugar will be checked by the staff and you will probably be given insulin  after surgery (instead of oral diabetes medicines) to make sure you have good blood sugar levels. The goal for blood sugar control after surgery is 80-180 mg/dL.                      Do NOT Smoke (Tobacco/Vaping) for 24 hours prior to your procedure.  If you use a CPAP at night, you may bring your mask/headgear for your overnight stay.   You will be asked to remove any contacts, glasses, piercing's, hearing aid's, dentures/partials prior to surgery. Please bring cases for these items if needed.    Patients discharged the day of surgery will not be allowed to drive home, and someone needs to stay with them for 24 hours.  SURGICAL WAITING ROOM VISITATION Patients may have no more than 2 support people in the waiting area - these visitors may rotate.   Pre-op nurse will coordinate an appropriate time for 1 ADULT support person, who may not rotate, to accompany patient in pre-op.  Children under the age of 53 must have an adult with them who is not the patient and must remain in the main waiting area with an adult.  If the patient needs to stay at the hospital during part of their recovery, the visitor guidelines for inpatient rooms apply.  Please refer to the [facility] website for the visitor guidelines for any additional information.   If you received a COVID test during your pre-op visit  it is requested that you wear a mask when out in public, stay away from anyone that may not be feeling well and notify your surgeon if you develop symptoms. If you have been in contact with anyone that has tested positive in the last 10 days please notify you surgeon.      Pre-operative CHG Bathing Instructions   You can play a key role in reducing the risk of  infection after surgery. Your skin needs to be as free of germs as possible. You can reduce the number of germs on your skin by washing with CHG (chlorhexidine  gluconate) soap before surgery. CHG is an antiseptic soap that kills germs and continues to kill germs even after washing.   DO NOT use if you have an allergy to chlorhexidine /CHG or antibacterial soaps. If your skin becomes reddened or irritated, stop using the CHG and notify one of our RNs at [phone].              TAKE A SHOWER THE NIGHT BEFORE SURGERY   Please keep in mind the following:  DO NOT shave, including legs and underarms, 48 hours prior to surgery.   You may shave your face before/day of surgery.  Place clean sheets on your bed the night before surgery Use a clean washcloth (not used since being washed) for shower. DO NOT sleep with pet's night before surgery.  CHG Shower Instructions:  Wash your face and private area with normal soap. If you choose to wash  your hair, wash first with your normal shampoo.  After you use shampoo/soap, rinse your hair and body thoroughly to remove shampoo/soap residue.  Turn the water OFF and apply half the bottle of CHG soap to a CLEAN washcloth.  Apply CHG soap ONLY FROM YOUR NECK DOWN TO YOUR TOES (washing for 3-5 minutes)  DO NOT use CHG soap on face, private areas, open wounds, or sores.  Pay special attention to the area where your surgery is being performed.  If you are having back surgery, having someone wash your back for you may be helpful. Wait 2 minutes after CHG soap is applied, then you may rinse off the CHG soap.  Pat dry with a clean towel  Put on clean pajamas    Additional instructions for the day of surgery: If you choose, you may shower the morning of surgery with an antibacterial soap.  DO NOT APPLY any lotions, deodorants, cologne, or perfumes.   Do not wear jewelry or makeup Do not wear nail polish, gel polish, artificial nails, or any other type of covering  on natural nails (fingers and toes) Do not bring valuables to the hospital. [facility] is not responsible for valuables/personal belongings. Put on clean/comfortable clothes.  Please brush your teeth.  Ask your nurse before applying any prescription medications to the skin.

## 2024-11-22 ENCOUNTER — Inpatient Hospital Stay (HOSPITAL_COMMUNITY): Admission: RE | Admit: 2024-11-22 | Discharge: 2024-11-22 | Attending: Orthopedic Surgery

## 2024-11-22 ENCOUNTER — Encounter (HOSPITAL_COMMUNITY): Payer: Self-pay | Admitting: Orthopedic Surgery

## 2024-11-22 ENCOUNTER — Other Ambulatory Visit: Payer: Self-pay

## 2024-11-22 VITALS — BP 114/81 | HR 73 | Temp 97.6°F | Resp 20 | Ht 67.0 in | Wt 200.9 lb

## 2024-11-22 DIAGNOSIS — N183 Chronic kidney disease, stage 3 unspecified: Secondary | ICD-10-CM | POA: Diagnosis not present

## 2024-11-22 DIAGNOSIS — Z01812 Encounter for preprocedural laboratory examination: Secondary | ICD-10-CM | POA: Diagnosis not present

## 2024-11-22 DIAGNOSIS — K219 Gastro-esophageal reflux disease without esophagitis: Secondary | ICD-10-CM | POA: Insufficient documentation

## 2024-11-22 DIAGNOSIS — Z79899 Other long term (current) drug therapy: Secondary | ICD-10-CM | POA: Diagnosis not present

## 2024-11-22 DIAGNOSIS — Z7902 Long term (current) use of antithrombotics/antiplatelets: Secondary | ICD-10-CM | POA: Diagnosis not present

## 2024-11-22 DIAGNOSIS — I129 Hypertensive chronic kidney disease with stage 1 through stage 4 chronic kidney disease, or unspecified chronic kidney disease: Secondary | ICD-10-CM | POA: Diagnosis not present

## 2024-11-22 DIAGNOSIS — Z981 Arthrodesis status: Secondary | ICD-10-CM | POA: Diagnosis not present

## 2024-11-22 DIAGNOSIS — J45909 Unspecified asthma, uncomplicated: Secondary | ICD-10-CM | POA: Diagnosis not present

## 2024-11-22 DIAGNOSIS — F319 Bipolar disorder, unspecified: Secondary | ICD-10-CM | POA: Insufficient documentation

## 2024-11-22 DIAGNOSIS — Z01818 Encounter for other preprocedural examination: Secondary | ICD-10-CM

## 2024-11-22 DIAGNOSIS — E1122 Type 2 diabetes mellitus with diabetic chronic kidney disease: Secondary | ICD-10-CM | POA: Insufficient documentation

## 2024-11-22 DIAGNOSIS — G4733 Obstructive sleep apnea (adult) (pediatric): Secondary | ICD-10-CM | POA: Diagnosis present

## 2024-11-22 DIAGNOSIS — R0789 Other chest pain: Secondary | ICD-10-CM | POA: Diagnosis not present

## 2024-11-22 DIAGNOSIS — G40909 Epilepsy, unspecified, not intractable, without status epilepticus: Secondary | ICD-10-CM | POA: Diagnosis not present

## 2024-11-22 DIAGNOSIS — Z8673 Personal history of transient ischemic attack (TIA), and cerebral infarction without residual deficits: Secondary | ICD-10-CM | POA: Diagnosis not present

## 2024-11-22 DIAGNOSIS — I10 Essential (primary) hypertension: Secondary | ICD-10-CM | POA: Diagnosis present

## 2024-11-22 LAB — BASIC METABOLIC PANEL WITH GFR
Anion gap: 9 (ref 5–15)
BUN: 16 mg/dL (ref 8–23)
CO2: 23 mmol/L (ref 22–32)
Calcium: 9.9 mg/dL (ref 8.9–10.3)
Chloride: 107 mmol/L (ref 98–111)
Creatinine, Ser: 1.39 mg/dL — ABNORMAL HIGH (ref 0.61–1.24)
GFR, Estimated: 57 mL/min — ABNORMAL LOW (ref >60)
Glucose, Bld: 113 mg/dL — ABNORMAL HIGH (ref 70–99)
Potassium: 4.2 mmol/L (ref 3.5–5.1)
Sodium: 139 mmol/L (ref 135–145)

## 2024-11-22 LAB — SURGICAL PCR SCREEN
MRSA, PCR: NEGATIVE
Staphylococcus aureus: NEGATIVE

## 2024-11-22 LAB — HEMOGLOBIN A1C
Hgb A1c MFr Bld: 8 % — ABNORMAL HIGH (ref 4.8–5.6)
Mean Plasma Glucose: 182.9 mg/dL

## 2024-11-22 LAB — CBC
HCT: 50.2 % (ref 39.0–52.0)
Hemoglobin: 16 g/dL (ref 13.0–17.0)
MCH: 26.8 pg (ref 26.0–34.0)
MCHC: 31.9 g/dL (ref 30.0–36.0)
MCV: 84.2 fL (ref 80.0–100.0)
Platelets: 172 K/uL (ref 150–400)
RBC: 5.96 MIL/uL — ABNORMAL HIGH (ref 4.22–5.81)
RDW: 16.7 % — ABNORMAL HIGH (ref 11.5–15.5)
WBC: 5.3 K/uL (ref 4.0–10.5)
nRBC: 0 % (ref 0.0–0.2)

## 2024-11-22 LAB — GLUCOSE, CAPILLARY: Glucose-Capillary: 98 mg/dL (ref 70–99)

## 2024-11-22 NOTE — Progress Notes (Signed)
 PCP - Madelin Osmond Cardiologist - Dr. Elmira  PPM/ICD - denies Device Orders - n/a Rep Notified - n/a  Chest x-ray - 07/26/24 EKG - 10/13/24 Stress Test - 05/28/23 ECHO - 06/01/23 Cardiac Cath - denies  Sleep Study - OSA+ on Bipap   Fasting Blood Sugar - 98-135 Patient wears an omnipod libre on left arm  Last dose of GLP1 agonist-  n/a GLP1 instructions:  n/a  Last does of Farxiga  was 12/16 - patient aware not to take another dose  Blood Thinner Instructions: n/a Aspirin  Instructions: n/a  ERAS Protcol - clears until 1215 PRE-SURGERY Ensure or G2-  n/a  COVID TEST- no   Anesthesia review: yes - CVA, OSA, seizures (last seizure was 3 years ago), HTN, DM  Patient denies shortness of breath, fever, cough and chest pain at PAT appointment   All instructions explained to the patient, with a verbal understanding of the material. Patient agrees to go over the instructions while at home for a better understanding. Patient also instructed to self quarantine after being tested for COVID-19. The opportunity to ask questions was provided.  Notified Lori of patient's wound on abdomen.  Patient states that he has two more doses of antibiotic to complete.  Wound examined at PAT appointment by nurse and no drainage or redness present.

## 2024-11-22 NOTE — Telephone Encounter (Signed)
 Left a message for pt to call back

## 2024-11-22 NOTE — Telephone Encounter (Signed)
-----   Message from Lacinda Staple, NP sent at 11/21/2024  3:31 PM EST ----- Please notify Mr. Carpenter that his abdominal xray revealed a moderate amount of stool in the colon. Proceed with bowel prep tonight as we discussed.

## 2024-11-22 NOTE — Progress Notes (Signed)
 Anesthesia Chart Review:  63 year old male follows with cardiology for history of HTN, OSA on BiPAP, atypical chest pain, CVA/TIA.  Coronary CTA 2021 with no CAD.  Echo 08/2024 with LVEF 50 to 55%.  Seen by Dr. Elmira 10/13/2024 for preop evaluation.  Per note, Preop risk stratification: Reassuring cardiac workup within last year and a half. No symptoms of angina at this time. Okay to proceed with upcoming surgery for cervical spine without any additional cardiac testing. Patient has questionable history of stroke in the past.  He has been on Plavix  for a long time.  Reasonable to switch to aspirin  81 mg daily.  This can be refilled with his PCP in future. He has remote history of gastric ulcer.  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  Last seen by Dr. Jama and at [facility] on 10/18/2024.  Per note, He has trialed multiple antiseizure medications previously and is currently maintained on Zonisamide  (Zonegran ) 100 mg, two capsules twice daily (200 mg BID), which previously provided effective control. 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 Features indicative of epileptic seizures include a consistent prodrome of metallic or blood taste or bilateral finger numbness, episodes of unresponsiveness, postictal fatigue and myalgia, and one historical episode of bowel incontinence. He also experienced a breakthrough seizure after discontinuing medication for approximately seven weeks. Conversely, characteristics suggestive of PNES are documented in multiple prior EEGs (2016-2018), where photic stimulation induced shaking episodes without EEG correlates, and these events resolved abruptly with topical anti-seizure cream. His most recent episode occurred on 9/25, described as right-sided weakness; however,  stroke evaluation at that time was negative.  Updated EEG was ordered to reassess baseline which was done 11/07/24 and was normal without seizures or epileptiform activity.  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  Patient also reports history of prolonged emergence.  Per anesthesia intubation note 09/03/2023, GlideScope used electively.  Preop labs reviewed, creatinine mildly elevated 1.39, A1c 8.0, otherwise unremarkable.  EKG 10/13/2024: NSR.  LAD.  Minimal voltage criteria for LVH, may be normal variant.  Possible anterior infarct (cited on or before 28-Jul-2023)  TTE bubble study 08/28/2024 (Care Everywhere): Summary    Procedure Details: Agitated Saline was utilized.. Lumason was used for visualization.   Left Ventricle: The systolic function is globally normal. The estimated ejection fraction is 50-55%.   Right Ventricle: Normal systolic function is visualized.   Septum: There is no shunt by agitated saline..   Coronary CTA 10/22/2020: IMPRESSION: 1. Coronary calcium  score of 0.   2. Normal coronary origin with right dominance.   3. Main pulmonary artery is at the upper limit of normal, 29mm.   3. CAD-RADS = 0.     Lynwood Hope, PA-C [facility] Short Stay Center/Anesthesiology Phone [phone] 11/22/2024 4:47 PM

## 2024-11-22 NOTE — Anesthesia Preprocedure Evaluation (Signed)
 Anesthesia Evaluation  Patient identified by MRN, date of birth, ID band Patient awake    Reviewed: Allergy & Precautions, NPO status , Patient's Chart, lab work & pertinent test results, reviewed documented beta blocker date and time   History of Anesthesia Complications (+) PROLONGED EMERGENCE and history of anesthetic complications  Airway Mallampati: III  TM Distance: >3 FB Neck ROM: Limited  Mouth opening: Limited Mouth Opening  Dental  (+) Dental Advisory Given   Pulmonary shortness of breath and with exertion, asthma , sleep apnea , COPD,  COPD inhaler, former smoker   Pulmonary exam normal breath sounds clear to auscultation       Cardiovascular hypertension, Pt. on medications Normal cardiovascular exam Rhythm:Regular Rate:Normal     Neuro/Psych  Headaches, Seizures -,  PSYCHIATRIC DISORDERS   Bipolar Disorder   Left sided weakness Cervical radiculopathy TIACVA, Residual Symptoms    GI/Hepatic PUD,GERD  Medicated,,(+)     substance abuse  Clean for 8 years   Endo/Other  diabetes, Poorly Controlled, Type 2, Insulin  Dependent Hyperthyroidism HLD  Renal/GU Renal InsufficiencyRenal disease     Musculoskeletal  (+) Arthritis , Osteoarthritis,  narcotic dependentCervical spondylosis Hx/o Cervical spine surgery with hardware placement   Abdominal  (+) + obese  Peds  Hematology  (+) Blood dyscrasia, anemia Plavix  therapy- last dose over a month ago   Anesthesia Other Findings   Reproductive/Obstetrics                              Anesthesia Physical Anesthesia Plan  ASA: 3  Anesthesia Plan: General   Post-op Pain Management: Dilaudid  IV   Induction: Intravenous and Cricoid pressure planned  PONV Risk Score and Plan: 4 or greater and Treatment may vary due to age or medical condition and Ondansetron   Airway Management Planned: Oral ETT and Video Laryngoscope  Planned  Additional Equipment: None  Intra-op Plan:   Post-operative Plan: Extubation in OR  Informed Consent: I have reviewed the patients History and Physical, chart, labs and discussed the procedure including the risks, benefits and alternatives for the proposed anesthesia with the patient or authorized representative who has indicated his/her understanding and acceptance.     Dental advisory given  Plan Discussed with: Anesthesiologist and CRNA  Anesthesia Plan Comments: (PAT note by Lynwood Hope, PA-C: 63 year old male follows with cardiology for history of HTN, OSA on BiPAP, atypical chest pain, CVA/TIA.  Coronary CTA 2021 with no CAD.  Echo 08/2024 with LVEF 50 to 55%.  Seen by Dr. Elmira 10/13/2024 for preop evaluation.  Per note, Preop risk stratification: Reassuring cardiac workup within last year and a half. No symptoms of angina at this time. Okay to proceed with upcoming surgery for cervical spine without any additional cardiac testing. Patient has questionable history of stroke in the past.  He has been on Plavix  for a long time.  Reasonable to switch to aspirin  81 mg daily.  This can be refilled with his PCP in future. He has remote history of gastric ulcer.  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  Last seen by Dr. Jama and at [facility] on 10/18/2024.  Per note, He has trialed multiple antiseizure medications previously and is currently maintained on Zonisamide  (Zonegran ) 100 mg, two capsules twice daily (200 mg BID), which previously provided effective control. 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 Features indicative of epileptic seizures include a consistent prodrome of metallic or blood taste or bilateral finger numbness, episodes of unresponsiveness, postictal  fatigue and myalgia, and one historical episode of bowel incontinence. He also experienced a breakthrough seizure after discontinuing medication for approximately seven weeks. Conversely, characteristics suggestive of PNES are documented in multiple prior EEGs (2016-2018), where photic stimulation induced shaking episodes without EEG correlates, and these events resolved abruptly with topical anti-seizure cream. His most recent episode occurred on 9/25, described as right-sided weakness; however, stroke evaluation at that time was negative.  Updated EEG was ordered to reassess baseline which was done 11/07/24 and was normal without seizures or epileptiform activity.  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  Patient also reports history of prolonged emergence.  Per anesthesia intubation note 09/03/2023, GlideScope used electively.  Preop labs reviewed, creatinine mildly elevated 1.39, A1c 8.0, otherwise unremarkable.  EKG 10/13/2024: NSR.  LAD.  Minimal voltage criteria for LVH, may be normal variant.  Possible anterior infarct (cited on or before 28-Jul-2023)  TTE bubble study 08/28/2024 (Care Everywhere): Summary    Procedure Details: Agitated Saline was utilized.. Lumason was used for visualization.   Left Ventricle: The systolic function is globally normal. The estimated ejection fraction is 50-55%.   Right Ventricle: Normal systolic function is visualized.   Septum: There is no shunt by agitated saline..   Coronary CTA 10/22/2020: IMPRESSION: 1. Coronary calcium  score of 0.   2. Normal coronary origin with right dominance.   3. Main pulmonary artery is at the upper limit of normal, 29mm.   3. CAD-RADS = 0.    )         Anesthesia Quick Evaluation

## 2024-11-23 ENCOUNTER — Ambulatory Visit (HOSPITAL_COMMUNITY)
Admission: RE | Admit: 2024-11-23 | Discharge: 2024-11-24 | Disposition: A | Attending: Orthopedic Surgery | Admitting: Orthopedic Surgery

## 2024-11-23 ENCOUNTER — Ambulatory Visit (HOSPITAL_BASED_OUTPATIENT_CLINIC_OR_DEPARTMENT_OTHER): Admitting: Anesthesiology

## 2024-11-23 ENCOUNTER — Encounter (HOSPITAL_COMMUNITY): Admission: RE | Payer: Self-pay | Source: Home / Self Care

## 2024-11-23 ENCOUNTER — Ambulatory Visit (HOSPITAL_COMMUNITY)

## 2024-11-23 ENCOUNTER — Ambulatory Visit (HOSPITAL_COMMUNITY): Admitting: Physician Assistant

## 2024-11-23 DIAGNOSIS — Z87891 Personal history of nicotine dependence: Secondary | ICD-10-CM | POA: Diagnosis not present

## 2024-11-23 DIAGNOSIS — M47812 Spondylosis without myelopathy or radiculopathy, cervical region: Secondary | ICD-10-CM | POA: Insufficient documentation

## 2024-11-23 DIAGNOSIS — I10 Essential (primary) hypertension: Secondary | ICD-10-CM

## 2024-11-23 DIAGNOSIS — Z79899 Other long term (current) drug therapy: Secondary | ICD-10-CM | POA: Insufficient documentation

## 2024-11-23 DIAGNOSIS — Z7902 Long term (current) use of antithrombotics/antiplatelets: Secondary | ICD-10-CM | POA: Insufficient documentation

## 2024-11-23 DIAGNOSIS — K219 Gastro-esophageal reflux disease without esophagitis: Secondary | ICD-10-CM | POA: Diagnosis not present

## 2024-11-23 DIAGNOSIS — G4730 Sleep apnea, unspecified: Secondary | ICD-10-CM | POA: Insufficient documentation

## 2024-11-23 DIAGNOSIS — E1122 Type 2 diabetes mellitus with diabetic chronic kidney disease: Secondary | ICD-10-CM | POA: Insufficient documentation

## 2024-11-23 DIAGNOSIS — E785 Hyperlipidemia, unspecified: Secondary | ICD-10-CM | POA: Diagnosis not present

## 2024-11-23 DIAGNOSIS — Z794 Long term (current) use of insulin: Secondary | ICD-10-CM | POA: Diagnosis not present

## 2024-11-23 DIAGNOSIS — J449 Chronic obstructive pulmonary disease, unspecified: Secondary | ICD-10-CM | POA: Diagnosis not present

## 2024-11-23 DIAGNOSIS — Z7984 Long term (current) use of oral hypoglycemic drugs: Secondary | ICD-10-CM | POA: Diagnosis not present

## 2024-11-23 DIAGNOSIS — F319 Bipolar disorder, unspecified: Secondary | ICD-10-CM | POA: Diagnosis not present

## 2024-11-23 DIAGNOSIS — Z01818 Encounter for other preprocedural examination: Secondary | ICD-10-CM

## 2024-11-23 DIAGNOSIS — G959 Disease of spinal cord, unspecified: Secondary | ICD-10-CM | POA: Diagnosis present

## 2024-11-23 DIAGNOSIS — M5412 Radiculopathy, cervical region: Secondary | ICD-10-CM

## 2024-11-23 DIAGNOSIS — D649 Anemia, unspecified: Secondary | ICD-10-CM | POA: Diagnosis not present

## 2024-11-23 DIAGNOSIS — F209 Schizophrenia, unspecified: Secondary | ICD-10-CM | POA: Insufficient documentation

## 2024-11-23 DIAGNOSIS — N1831 Chronic kidney disease, stage 3a: Secondary | ICD-10-CM | POA: Diagnosis not present

## 2024-11-23 DIAGNOSIS — M5001 Cervical disc disorder with myelopathy,  high cervical region: Secondary | ICD-10-CM | POA: Diagnosis not present

## 2024-11-23 DIAGNOSIS — J4489 Other specified chronic obstructive pulmonary disease: Secondary | ICD-10-CM | POA: Diagnosis not present

## 2024-11-23 DIAGNOSIS — G8929 Other chronic pain: Secondary | ICD-10-CM | POA: Diagnosis not present

## 2024-11-23 DIAGNOSIS — I129 Hypertensive chronic kidney disease with stage 1 through stage 4 chronic kidney disease, or unspecified chronic kidney disease: Secondary | ICD-10-CM | POA: Diagnosis not present

## 2024-11-23 DIAGNOSIS — N400 Enlarged prostate without lower urinary tract symptoms: Secondary | ICD-10-CM | POA: Insufficient documentation

## 2024-11-23 DIAGNOSIS — E1165 Type 2 diabetes mellitus with hyperglycemia: Secondary | ICD-10-CM | POA: Diagnosis not present

## 2024-11-23 DIAGNOSIS — Z8673 Personal history of transient ischemic attack (TIA), and cerebral infarction without residual deficits: Secondary | ICD-10-CM | POA: Insufficient documentation

## 2024-11-23 DIAGNOSIS — E1140 Type 2 diabetes mellitus with diabetic neuropathy, unspecified: Secondary | ICD-10-CM

## 2024-11-23 DIAGNOSIS — M4712 Other spondylosis with myelopathy, cervical region: Secondary | ICD-10-CM | POA: Insufficient documentation

## 2024-11-23 HISTORY — PX: ANTERIOR CERVICAL DECOMP/DISCECTOMY FUSION: SHX1161

## 2024-11-23 LAB — GLUCOSE, CAPILLARY
Glucose-Capillary: 92 mg/dL (ref 70–99)
Glucose-Capillary: 79 mg/dL (ref 70–99)
Glucose-Capillary: 106 mg/dL — ABNORMAL HIGH (ref 70–99)
Glucose-Capillary: 123 mg/dL — ABNORMAL HIGH (ref 70–99)

## 2024-11-23 SURGERY — ANTERIOR CERVICAL DECOMPRESSION/DISCECTOMY FUSION 1 LEVEL
Anesthesia: General | Site: Spine Cervical

## 2024-11-23 MED ORDER — ONDANSETRON HCL 4 MG/2ML IJ SOLN: 4.0000 mg | Freq: Four times a day (QID) | INTRAMUSCULAR | Status: DC | PRN

## 2024-11-23 MED ORDER — HYDROMORPHONE HCL 1 MG/ML IJ SOLN
INTRAMUSCULAR | Status: AC
  Filled 2024-11-23: qty 1

## 2024-11-23 MED ORDER — CHLORHEXIDINE GLUCONATE 0.12 % MT SOLN
15.0000 mL | Freq: Once | OROMUCOSAL | Status: AC
  Administered 2024-11-23: 15:00:00 15 mL via OROMUCOSAL
  Filled 2024-11-23: qty 15

## 2024-11-23 MED ORDER — ATORVASTATIN CALCIUM 80 MG PO TABS
80.0000 mg | ORAL_TABLET | Freq: Every day | ORAL | Status: DC
  Administered 2024-11-24: 80 mg via ORAL
  Filled 2024-11-23: qty 1

## 2024-11-23 MED ORDER — PROPOFOL 10 MG/ML IV BOLUS
INTRAVENOUS | Status: DC | PRN
  Administered 2024-11-23: 15:00:00 100 mg via INTRAVENOUS

## 2024-11-23 MED ORDER — PROPOFOL 10 MG/ML IV BOLUS
INTRAVENOUS | Status: AC
  Filled 2024-11-23: qty 20

## 2024-11-23 MED ORDER — MIDAZOLAM HCL (PF) 2 MG/2ML IJ SOLN
INTRAMUSCULAR | Status: DC | PRN
  Administered 2024-11-23: 15:00:00 2 mg via INTRAVENOUS

## 2024-11-23 MED ORDER — ACETAMINOPHEN 325 MG PO TABS: 650.0000 mg | ORAL_TABLET | ORAL | Status: DC | PRN

## 2024-11-23 MED ORDER — PHENYLEPHRINE HCL-NACL 20-0.9 MG/250ML-% IV SOLN
INTRAVENOUS | Status: DC | PRN
  Administered 2024-11-23: 16:00:00 40 ug/min via INTRAVENOUS

## 2024-11-23 MED ORDER — BRIMONIDINE TARTRATE 0.2 % OP SOLN
1.0000 [drp] | Freq: Two times a day (BID) | OPHTHALMIC | Status: DC
  Administered 2024-11-23 – 2024-11-24 (×2): 1 [drp] via OPHTHALMIC
  Filled 2024-11-23: qty 5

## 2024-11-23 MED ORDER — VANCOMYCIN HCL IN DEXTROSE 1-5 GM/200ML-% IV SOLN
1000.0000 mg | INTRAVENOUS | Status: DC
  Filled 2024-11-23: qty 200

## 2024-11-23 MED ORDER — FENTANYL CITRATE (PF) 250 MCG/5ML IJ SOLN
INTRAMUSCULAR | Status: DC | PRN
  Administered 2024-11-23: 16:00:00 100 ug via INTRAVENOUS

## 2024-11-23 MED ORDER — INSULIN ASPART 100 UNIT/ML IJ SOLN
0.0000 [IU] | Freq: Three times a day (TID) | INTRAMUSCULAR | Status: DC
  Administered 2024-11-24 (×2): 3 [IU] via SUBCUTANEOUS
  Filled 2024-11-23 (×2): qty 3

## 2024-11-23 MED ORDER — DROPERIDOL 2.5 MG/ML IJ SOLN: 0.6250 mg | Freq: Once | INTRAMUSCULAR | Status: DC | PRN

## 2024-11-23 MED ORDER — FLUTICASONE PROPIONATE 50 MCG/ACT NA SUSP: 2.0000 | Freq: Every day | NASAL | Status: DC | PRN

## 2024-11-23 MED ORDER — QUETIAPINE FUMARATE 25 MG PO TABS
25.0000 mg | ORAL_TABLET | Freq: Every day | ORAL | Status: DC
  Administered 2024-11-23: 22:00:00 25 mg via ORAL
  Filled 2024-11-23 (×2): qty 1

## 2024-11-23 MED ORDER — VERAPAMIL HCL ER 100 MG PO CP24: 100.0000 mg | ORAL_CAPSULE | Freq: Every day | ORAL | Status: DC

## 2024-11-23 MED ORDER — TAMSULOSIN HCL 0.4 MG PO CAPS
0.4000 mg | ORAL_CAPSULE | Freq: Every day | ORAL | Status: DC
  Administered 2024-11-24: 0.4 mg via ORAL
  Filled 2024-11-23: qty 1

## 2024-11-23 MED ORDER — INSULIN ASPART 100 UNIT/ML IJ SOLN: 0.0000 [IU] | Freq: Three times a day (TID) | INTRAMUSCULAR | Status: DC

## 2024-11-23 MED ORDER — MAGNESIUM CITRATE PO SOLN
1.0000 | Freq: Once | ORAL | Status: AC | PRN
  Administered 2024-11-24: 1 via ORAL
  Filled 2024-11-23: qty 296

## 2024-11-23 MED ORDER — LACTATED RINGERS IV SOLN: INTRAVENOUS | Status: DC

## 2024-11-23 MED ORDER — DICYCLOMINE HCL 20 MG PO TABS
20.0000 mg | ORAL_TABLET | Freq: Two times a day (BID) | ORAL | Status: DC
  Administered 2024-11-23 – 2024-11-24 (×2): 20 mg via ORAL
  Filled 2024-11-23 (×3): qty 1

## 2024-11-23 MED ORDER — SODIUM CHLORIDE 0.9% FLUSH: 3.0000 mL | INTRAVENOUS | Status: DC | PRN

## 2024-11-23 MED ORDER — CEFAZOLIN SODIUM-DEXTROSE 2-4 GM/100ML-% IV SOLN
2.0000 g | INTRAVENOUS | Status: AC
  Administered 2024-11-23: 15:00:00 2 g via INTRAVENOUS
  Filled 2024-11-23: qty 100

## 2024-11-23 MED ORDER — POLYETHYLENE GLYCOL 3350 17 G PO PACK: 17.0000 g | PACK | Freq: Every day | ORAL | Status: DC | PRN

## 2024-11-23 MED ORDER — PANTOPRAZOLE SODIUM 40 MG PO TBEC
40.0000 mg | DELAYED_RELEASE_TABLET | Freq: Every morning | ORAL | Status: DC
  Administered 2024-11-24: 40 mg via ORAL
  Filled 2024-11-23: qty 1

## 2024-11-23 MED ORDER — MIDAZOLAM HCL 2 MG/2ML IJ SOLN
INTRAMUSCULAR | Status: AC
  Filled 2024-11-23: qty 2

## 2024-11-23 MED ORDER — PROPOFOL 1000 MG/100ML IV EMUL
INTRAVENOUS | Status: AC
  Filled 2024-11-23: qty 100

## 2024-11-23 MED ORDER — HYDROMORPHONE HCL 1 MG/ML IJ SOLN: 0.2500 mg | INTRAMUSCULAR | Status: DC | PRN

## 2024-11-23 MED ORDER — INSULIN GLARGINE 100 UNIT/ML ~~LOC~~ SOLN
24.0000 [IU] | Freq: Every day | SUBCUTANEOUS | Status: DC
  Administered 2024-11-24: 24 [IU] via SUBCUTANEOUS
  Filled 2024-11-23 (×2): qty 0.24

## 2024-11-23 MED ORDER — THROMBIN 20000 UNITS EX SOLR
CUTANEOUS | Status: AC
  Filled 2024-11-23: qty 20000

## 2024-11-23 MED ORDER — 0.9 % SODIUM CHLORIDE (POUR BTL) OPTIME
TOPICAL | Status: DC | PRN
  Administered 2024-11-23: 16:00:00 1000 mL

## 2024-11-23 MED ORDER — SUCCINYLCHOLINE CHLORIDE 200 MG/10ML IV SOSY
PREFILLED_SYRINGE | INTRAVENOUS | Status: AC
  Filled 2024-11-23: qty 20

## 2024-11-23 MED ORDER — BUPIVACAINE-EPINEPHRINE 0.25% -1:200000 IJ SOLN
INTRAMUSCULAR | Status: DC | PRN
  Administered 2024-11-23: 16:00:00 5 mL

## 2024-11-23 MED ORDER — OXYCODONE HCL 5 MG/5ML PO SOLN: 5.0000 mg | Freq: Once | ORAL | Status: DC | PRN

## 2024-11-23 MED ORDER — OMNIPOD 5 LIBRE2 PLUS G6 PODS MISC: Status: DC

## 2024-11-23 MED ORDER — ONDANSETRON HCL 4 MG PO TABS: 4.0000 mg | ORAL_TABLET | Freq: Four times a day (QID) | ORAL | Status: DC | PRN

## 2024-11-23 MED ORDER — GLYCOPYRROLATE PF 0.2 MG/ML IJ SOSY
PREFILLED_SYRINGE | INTRAMUSCULAR | Status: DC | PRN
  Administered 2024-11-23: 17:00:00 .1 mg via INTRAVENOUS

## 2024-11-23 MED ORDER — FLUTICASONE FUROATE-VILANTEROL 100-25 MCG/ACT IN AEPB: 1.0000 | INHALATION_SPRAY | Freq: Every day | RESPIRATORY_TRACT | Status: DC

## 2024-11-23 MED ORDER — SENNOSIDES-DOCUSATE SODIUM 8.6-50 MG PO TABS
1.0000 | ORAL_TABLET | Freq: Two times a day (BID) | ORAL | Status: DC | PRN
  Administered 2024-11-23: 22:00:00 2 via ORAL
  Filled 2024-11-23: qty 2

## 2024-11-23 MED ORDER — VANCOMYCIN HCL 1000 MG IV SOLR
1000.0000 mg | Freq: Two times a day (BID) | INTRAVENOUS | Status: DC
  Administered 2024-11-23: 16:00:00 1000 mg via INTRAVENOUS

## 2024-11-23 MED ORDER — ORAL CARE MOUTH RINSE: 15.0000 mL | Freq: Once | OROMUCOSAL | Status: AC

## 2024-11-23 MED ORDER — NEOSTIGMINE METHYLSULFATE 3 MG/3ML IV SOSY
PREFILLED_SYRINGE | INTRAVENOUS | Status: AC
  Filled 2024-11-23: qty 3

## 2024-11-23 MED ORDER — ALBUTEROL SULFATE HFA 108 (90 BASE) MCG/ACT IN AERS: 1.0000 | INHALATION_SPRAY | Freq: Four times a day (QID) | RESPIRATORY_TRACT | Status: DC | PRN

## 2024-11-23 MED ORDER — CEFAZOLIN SODIUM-DEXTROSE 1-4 GM/50ML-% IV SOLN
1.0000 g | Freq: Three times a day (TID) | INTRAVENOUS | Status: AC
  Administered 2024-11-23 – 2024-11-24 (×2): 1 g via INTRAVENOUS
  Filled 2024-11-23 (×2): qty 50

## 2024-11-23 MED ORDER — PROPOFOL 500 MG/50ML IV EMUL
INTRAVENOUS | Status: DC | PRN
  Administered 2024-11-23: 16:00:00 100 ug/kg/min via INTRAVENOUS

## 2024-11-23 MED ORDER — BUPIVACAINE-EPINEPHRINE (PF) 0.25% -1:200000 IJ SOLN
INTRAMUSCULAR | Status: AC
  Filled 2024-11-23: qty 30

## 2024-11-23 MED ORDER — RIZATRIPTAN BENZOATE 10 MG PO TBDP: 10.0000 mg | ORAL_TABLET | ORAL | Status: DC | PRN

## 2024-11-23 MED ORDER — ONDANSETRON HCL 4 MG/2ML IJ SOLN
INTRAMUSCULAR | Status: DC | PRN
  Administered 2024-11-23: 16:00:00 4 mg via INTRAVENOUS

## 2024-11-23 MED ORDER — OXYCODONE HCL 5 MG PO TABS
5.0000 mg | ORAL_TABLET | ORAL | Status: DC | PRN
  Administered 2024-11-23: 22:00:00 5 mg via ORAL
  Filled 2024-11-23: qty 1

## 2024-11-23 MED ORDER — ALBUTEROL SULFATE (2.5 MG/3ML) 0.083% IN NEBU: 2.5000 mg | INHALATION_SOLUTION | Freq: Four times a day (QID) | RESPIRATORY_TRACT | Status: DC | PRN

## 2024-11-23 MED ORDER — INSULIN ASPART 100 UNIT/ML IJ SOLN: 0.0000 [IU] | Freq: Every day | INTRAMUSCULAR | Status: DC

## 2024-11-23 MED ORDER — SUCCINYLCHOLINE CHLORIDE 200 MG/10ML IV SOSY
PREFILLED_SYRINGE | INTRAVENOUS | Status: DC | PRN
  Administered 2024-11-23: 15:00:00 120 mg via INTRAVENOUS

## 2024-11-23 MED ORDER — ONDANSETRON HCL 4 MG/2ML IJ SOLN
INTRAMUSCULAR | Status: AC
  Filled 2024-11-23: qty 2

## 2024-11-23 MED ORDER — THROMBIN 20000 UNITS EX SOLR: CUTANEOUS | Status: DC | PRN

## 2024-11-23 MED ORDER — LACTATED RINGERS IV SOLN: INTRAVENOUS | Status: DC | PRN

## 2024-11-23 MED ORDER — SODIUM CHLORIDE 0.9 % IV SOLN: 250.0000 mL | INTRAVENOUS | Status: DC

## 2024-11-23 MED ORDER — LINACLOTIDE 72 MCG PO CAPS
72.0000 ug | ORAL_CAPSULE | Freq: Every day | ORAL | Status: DC
  Administered 2024-11-24: 72 ug via ORAL
  Filled 2024-11-23: qty 1

## 2024-11-23 MED ORDER — LIDOCAINE 2% (20 MG/ML) 5 ML SYRINGE
INTRAMUSCULAR | Status: DC | PRN
  Administered 2024-11-23: 17:00:00 20 mg via INTRAVENOUS
  Administered 2024-11-23: 15:00:00 60 mg via INTRAVENOUS

## 2024-11-23 MED ORDER — ROCURONIUM BROMIDE 10 MG/ML (PF) SYRINGE
PREFILLED_SYRINGE | INTRAVENOUS | Status: AC
  Filled 2024-11-23: qty 20

## 2024-11-23 MED ORDER — DAPAGLIFLOZIN PROPANEDIOL 10 MG PO TABS
10.0000 mg | ORAL_TABLET | Freq: Every day | ORAL | Status: DC
  Administered 2024-11-24: 10 mg via ORAL
  Filled 2024-11-23: qty 1

## 2024-11-23 MED ORDER — SODIUM CHLORIDE 0.9% FLUSH
3.0000 mL | Freq: Two times a day (BID) | INTRAVENOUS | Status: DC
  Administered 2024-11-23 – 2024-11-24 (×2): 3 mL via INTRAVENOUS

## 2024-11-23 MED ORDER — GLYCOPYRROLATE PF 0.2 MG/ML IJ SOSY
PREFILLED_SYRINGE | INTRAMUSCULAR | Status: AC
  Filled 2024-11-23: qty 1

## 2024-11-23 MED ORDER — OXYCODONE HCL 5 MG PO TABS: 5.0000 mg | ORAL_TABLET | Freq: Once | ORAL | Status: DC | PRN

## 2024-11-23 MED ORDER — HYDROMORPHONE HCL 1 MG/ML IJ SOLN: 1.0000 mg | INTRAMUSCULAR | Status: DC | PRN

## 2024-11-23 MED ORDER — METFORMIN HCL ER 500 MG PO TB24: 500.0000 mg | ORAL_TABLET | Freq: Two times a day (BID) | ORAL | Status: DC

## 2024-11-23 MED ORDER — ACETAMINOPHEN 650 MG RE SUPP: 650.0000 mg | RECTAL | Status: DC | PRN

## 2024-11-23 MED ORDER — ZONISAMIDE 100 MG PO CAPS
200.0000 mg | ORAL_CAPSULE | Freq: Two times a day (BID) | ORAL | Status: DC
  Administered 2024-11-23 – 2024-11-24 (×2): 200 mg via ORAL
  Filled 2024-11-23 (×3): qty 2

## 2024-11-23 MED ORDER — OXYCODONE HCL 5 MG PO TABS
10.0000 mg | ORAL_TABLET | ORAL | Status: DC | PRN
  Administered 2024-11-24 (×3): 10 mg via ORAL
  Filled 2024-11-23 (×3): qty 2

## 2024-11-23 MED ORDER — ROCURONIUM BROMIDE 10 MG/ML (PF) SYRINGE
PREFILLED_SYRINGE | INTRAVENOUS | Status: DC | PRN
  Administered 2024-11-23: 16:00:00 40 mg via INTRAVENOUS
  Administered 2024-11-23: 16:00:00 60 mg via INTRAVENOUS
  Administered 2024-11-23: 15:00:00 10 mg via INTRAVENOUS

## 2024-11-23 MED ORDER — MONTELUKAST SODIUM 10 MG PO TABS
10.0000 mg | ORAL_TABLET | Freq: Every day | ORAL | Status: DC
  Administered 2024-11-23: 22:00:00 10 mg via ORAL
  Filled 2024-11-23: qty 1

## 2024-11-23 MED ORDER — DEXMEDETOMIDINE HCL IN NACL 80 MCG/20ML IV SOLN
INTRAVENOUS | Status: DC | PRN
  Administered 2024-11-23: 17:00:00 8 ug via INTRAVENOUS

## 2024-11-23 MED ORDER — ONDANSETRON HCL 4 MG/2ML IJ SOLN: 4.0000 mg | Freq: Once | INTRAMUSCULAR | Status: DC | PRN

## 2024-11-23 MED ORDER — PHENYLEPHRINE 80 MCG/ML (10ML) SYRINGE FOR IV PUSH (FOR BLOOD PRESSURE SUPPORT)
PREFILLED_SYRINGE | INTRAVENOUS | Status: AC
  Filled 2024-11-23: qty 10

## 2024-11-23 MED ADMIN — Thrombin For Soln 1000 Unit: 1 | TOPICAL | @ 16:00:00 | NDC 99999080165

## 2024-11-23 MED ADMIN — Sugammadex Sodium IV 200 MG/2ML (Base Equivalent): 200 mg | INTRAVENOUS | @ 18:00:00 | NDC 00006542302

## 2024-11-23 MED FILL — Fentanyl Citrate Preservative Free (PF) Inj 100 MCG/2ML: INTRAMUSCULAR | Qty: 2 | Status: AC

## 2024-11-23 SURGICAL SUPPLY — 56 items
ALLOGRFT BNE OSSIFUSE FBR 1CC (Bone Implant) IMPLANT
BAG COUNTER SPONGE SURGICOUNT (BAG) ×1 IMPLANT
BLADE CLIPPER SURG (BLADE) IMPLANT
CABLE BIPOLOR RESECTION CORD (MISCELLANEOUS) ×1 IMPLANT
CAGE SPNL 6D 14XMED 16X7X (Cage) IMPLANT
CANISTER SUCTION 3000ML PPV (SUCTIONS) ×1 IMPLANT
CLSR STERI-STRIP ANTIMIC 1/2X4 (GAUZE/BANDAGES/DRESSINGS) ×1 IMPLANT
COVER MAYO STAND STRL (DRAPES) ×3 IMPLANT
COVER SURGICAL LIGHT HANDLE (MISCELLANEOUS) ×2 IMPLANT
DRAPE C-ARM 42X72 X-RAY (DRAPES) ×1 IMPLANT
DRAPE POUCH INSTRU U-SHP 10X18 (DRAPES) ×1 IMPLANT
DRAPE SURG 17X23 STRL (DRAPES) ×1 IMPLANT
DRAPE U-SHAPE 47X51 STRL (DRAPES) ×1 IMPLANT
DRSG OPSITE POSTOP 3X4 (GAUZE/BANDAGES/DRESSINGS) ×1 IMPLANT
DRSG OPSITE POSTOP 4X6 (GAUZE/BANDAGES/DRESSINGS) IMPLANT
DURAPREP 26ML APPLICATOR (WOUND CARE) ×1 IMPLANT
ELECT COATED BLADE 2.86 ST (ELECTRODE) ×1 IMPLANT
ELECT PENCIL ROCKER SW 15FT (MISCELLANEOUS) ×1 IMPLANT
ELECTRODE REM PT RTRN 9FT ADLT (ELECTROSURGICAL) ×1 IMPLANT
GLOVE BIO SURGEON STRL SZ 6.5 (GLOVE) ×1 IMPLANT
GLOVE BIOGEL PI IND STRL 6.5 (GLOVE) ×1 IMPLANT
GLOVE BIOGEL PI IND STRL 8.5 (GLOVE) ×1 IMPLANT
GLOVE SS BIOGEL STRL SZ 8.5 (GLOVE) ×1 IMPLANT
GOWN STRL REUS W/ TWL LRG LVL3 (GOWN DISPOSABLE) ×1 IMPLANT
GOWN STRL REUS W/TWL 2XL LVL3 (GOWN DISPOSABLE) ×1 IMPLANT
KIT BASIN OR (CUSTOM PROCEDURE TRAY) ×1 IMPLANT
KIT TURNOVER KIT B (KITS) ×1 IMPLANT
NDL HYPO 22X1.5 SAFETY MO (MISCELLANEOUS) ×1 IMPLANT
NDL SPNL 18GX3.5 QUINCKE PK (NEEDLE) ×1 IMPLANT
NEEDLE HYPO 22X1.5 SAFETY MO (MISCELLANEOUS) ×1 IMPLANT
NEEDLE SPNL 18GX3.5 QUINCKE PK (NEEDLE) ×1 IMPLANT
PACK ORTHO CERVICAL (CUSTOM PROCEDURE TRAY) ×1 IMPLANT
PACK UNIVERSAL I (CUSTOM PROCEDURE TRAY) ×1 IMPLANT
PAD ARMBOARD POSITIONER FOAM (MISCELLANEOUS) ×2 IMPLANT
PIN DISTRACTION MAXCESS-C 14 (PIN) IMPLANT
PLATE LOCK ENDO TCS F/COVER (Plate) IMPLANT
POSITIONER HEAD DONUT 9IN (MISCELLANEOUS) ×1 IMPLANT
RESTRAINT LIMB HOLDER UNIV (RESTRAINTS) ×1 IMPLANT
SCREW 3.8X16MM (Screw) IMPLANT
SCREW ENDO BONE 3.8X14MM (Screw) IMPLANT
SOLN 0.9% NACL POUR BTL 1000ML (IV SOLUTION) ×1 IMPLANT
SOLN STERILE WATER BTL 1000 ML (IV SOLUTION) ×1 IMPLANT
SPONGE INTESTINAL PEANUT (DISPOSABLE) ×1 IMPLANT
SPONGE SURGIFOAM ABS GEL 100 (HEMOSTASIS) IMPLANT
SPONGE SURGIFOAM ABS GEL SZ50 (HEMOSTASIS) ×1 IMPLANT
SURGIFLO W/THROMBIN 8M KIT (HEMOSTASIS) IMPLANT
SUT BONE WAX W31G (SUTURE) ×1 IMPLANT
SUT MNCRL AB 3-0 PS2 27 (SUTURE) ×1 IMPLANT
SUT SILK 3-0 18XBRD TIE 12 (SUTURE) IMPLANT
SUT VIC AB 2-0 CT1 18 (SUTURE) ×1 IMPLANT
SYR BULB IRRIG 60ML STRL (SYRINGE) ×1 IMPLANT
SYR CONTROL 10ML LL (SYRINGE) ×1 IMPLANT
TAPE CLOTH 4X10 WHT NS (GAUZE/BANDAGES/DRESSINGS) ×1 IMPLANT
TAPE UMBILICAL 1/8X30 (MISCELLANEOUS) ×1 IMPLANT
TOWEL GREEN STERILE (TOWEL DISPOSABLE) ×1 IMPLANT
TOWEL GREEN STERILE FF (TOWEL DISPOSABLE) ×1 IMPLANT

## 2024-11-23 NOTE — Inpatient Diabetes Management (Signed)
 Inpatient Diabetes Program Recommendations  AACE/ADA: New Consensus Statement on Inpatient Glycemic Control (2015)  Target Ranges:  Prepandial:   less than 140 mg/dL      Peak postprandial:   less than 180 mg/dL (1-2 hours)      Critically ill patients:  140 - 180 mg/dL    Latest Reference Range & Units 11/22/24 13:56 11/23/24 13:26 11/23/24 15:07  Glucose-Capillary 70 - 99 mg/dL 98 92 79     Admit for ACDF C3-C4   History: DM2  Home DM Meds: Omni Pod 5 Insulin  Pump       Freestyle Libre 2 CGM       Toujeo  24 units at [employer] by [employer] about this pt.  Concern about pt's Insulin  Pump.  Went to see pt with fellow Diabetes Coordinator.  We both looked at his Insulin  Pump controller (PDM device) and did not see that the pump was currently delivering a basal rate of insulin .  CBG was 79 while we were in the room and FSL2 reading showed glucose 71.  Insulin  pump was in Auto Mode and was not currently delivering any basal insulin .  Pt did take 12 units Toujeo  insulin  last PM.  Pt did not know how to suspend the pump for Surgery on his PDM.  Recommend pt remove the Pump pod from his arm (just placed new pod this AM) and MD/RN manage CBGs while off pump during surgery given CBGs in the 70's right now prior to surgery.  Pt instructed to restart Insulin  pump pod immediately after surgery once awake and able to do so.  Pt stated he does have another insulin  pump pod and Insulin  in his belongings.  PDM placed in to pt's bag at bedside and bag zipped up.  Pt removed the pod at 3:10pm.  Will follow up with pt Friday AM.   --Will follow patient during hospitalization--  Adina Rudolpho Arrow RN, MSN, CDCES Diabetes Coordinator Inpatient Glycemic Control Team Team Pager: [phone] (8a-5p)

## 2024-11-23 NOTE — H&P (Signed)
 History: The patient is a 63 year old male who presents for pre-operative visit in preparation for their ACDF C3-C4, which is scheduled on 11/23/2024 at [facility]. The patient has had symptoms in the Neck including pain, decreasd ROM, and radiculopathy which has impacted their quality of life and ability to do activities of daily living. The patient currently has a diagnosis of cervical radiculopathy and cervicla stenosis of C3-C4 and has failed conservative treatments including activity modification., pain mgmt, PT,. The patient has had previous ACDF of C4-C7 . The patient denies an active infection.   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69---  LAST SEIZURE 1 WK AGO (APPROX ,  10-31-2013)   NO NEUROLOGIST---  PT SEES PCP  DR KENARD   Feeling of incomplete bladder emptying    Frequency of urination    Gastric ulcer    GERD (gastroesophageal reflux disease)    Glaucoma    Hypertension    Hyperthyroidism    NO MEDS    Migraine    Seizures (HCC)    Sleep apnea    Stroke [facility])    Substance abuse (HCC)    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 Inspection: No obvious deformity, scoliosis, kyphosis, loss of lordotic curve. Heart: RRR Lungs: Normal pulmonary effort, no signs of respiratory distress Abdomen: Normal BSX4, non-tender, non-distended, no hepatosplenomegaly. Skin: No abnormal lesions, abrasions, or contusions.  Palpation: Tender over  spinous processes and neck musculature.  AROM Cervical Spine: - Fwd Flexion: decreased and painful - Extension: decreased and painful - Lateral bending to left: decreased and painful - Lateral Bending to right: decreased and painful - Rotation to Left: decreased and painful - Rotation to Right: decreased and painful -Shoulder, elbow, and wrists AROM normal and pain free.  Dermatomes: UE dermatomes abnormal to light touch bilaterally. Positive dysesthesias in the right upper extremity in the C4 dermatome distribution Myotomes:  - C5: Left 5/5, Right 4/5 -C6: Left 5/5, Right 5/5 - C7: Left 5/5, Right 5/5 - C8: Left 5/5, Right 4/5 - T1: Left 5/5, Right 5/5  Reflexes:  - Biceps: Left2+, Right 2+ - Brachioradialius: Left2+, Right 2+ - Triceps: Left 2+, Right 2+ - Hoffman's: Negative   Special Tests: -Spurling: Left: Negative Right: Negative  PV: Extremities warm and well perfused. Distal pulses 2+ bilaterally.  X-Ray impression: 2vCervical: No evidence of degenerative scoliosis, misalignment of the spine, or abnormal rotation. Patient does have a cervical fusion from C4-C7. No evidence of spondylolisthesis. Adjacent segment disease at C3-C4. No signs of fractures, bony lesions, or other bony abnormalities.  MRI Impression: MRI of the cervical spine performed on May 18, 2024 by [facility] demonstrates no cord signal changes. There is a prior cervical fusion from C4-C5, C5-C6, C6-C7. At C3-C4 there is moderate severe spinal canal stenosis and marked flattening of the cord  due to severe bulging disc osteophyte complex. There is severe osteophytosis and severe right facet arthrosis and hypertrophy. There is moderate thickening of the ligamentum flavum. Severe right and moderate to severe left neuroforaminal narrowing. Moderate active inflammation of the severe right facet arthrosis.  A/P: Patient has failed conservative management, therefore we have elected to proceed with the  following surgical procedure ACDF C3-C4 on 11/23/2024 at [facility]   He has failed conservative treatment with physical therapy, and injection therapy. On exam he has decreased range of motion and pain with range of motion of the cervical spine as well as decreased strength on the right side in the C5 and C8 myotomes. He states that his radicular arm pain on the right side is constant. It is primarily in the C4 dermatome distribution. He does have a prior fusion from C4-C7. Pain management with Dr. Bonner. His last cervical ESI did not provide significant relief. He does have adjacent segment disease on x-ray imaging at C3-C4. MRI imaging from June of this year demonstrated C3-C4 there is moderate to severe spinal canal stenosis and moderate flattening of the cord due to severe bulging disc osteophyte complex and severe right facet arthrosis and hypertrophy with moderate thickening of the ligamentum flavum. There is severe right and moderate severe left neuroforaminal narrowing. There is moderate active inflammation of severe right facet arthrosis. At this time we discussed treatment options which include continued injections, pain medical management, surgical intervention, and doing nothing. At this time using shared decision making with the patient we do believe that surgical intervention is warranted. Surgical intervention would include an anterior cervical decompression discectomy fusion at C3-C4 with a 0 profile Titan cage.  ENT evaluation was clear   The risks of surgery were discussed today with the patient, below are the surgical risks we did discuss. Risks and benefits of surgery were discussed with the patient. These include: Infection, bleeding, death, stroke, paralysis, ongoing or worse pain, need for additional surgery, nonunion, leak of spinal fluid, adjacent segment degeneration requiring additional fusion surgery. Pseudoarthrosis (nonunion)requiring supplemental posterior fixation. Throat pain,  swallowing difficulties, hoarseness or change in voice.   Diagnosis: cervical radiculopathy and stenosis at C3-C4      [1]  Allergies Allergen Reactions   Finasteride Other (See Comments)    Break out   Levothyroxine Anaphylaxis and Cough    Chronic cough   Nsaids Other (See Comments)    D/t gastric ulcer   Prednisone  Itching, Other (See Comments) and Anaphylaxis    Throat swelling and cough   Valproic Acid  Itching and Other (See Comments)    Other Reaction(s): Not available  Other Reaction(s): Not available, Other (See Comments), Other (See Comments)    Causes double vision and speech problems   Causes double vision and speech problems    Kidney issues   Kidney issues    Causes double vision and speech problems   Causes double vision and speech problems   Causes double vision and speech problems  Causes double vision and speech problems    Kidney issues   Kidney issues   Kidney issues  Kidney issues    Causes double vision and speech problems     Kidney issues     calcium  valproate    sodium valproate    divalproex  sodium  sodium valproate  calcium  valproate   Amoxicillin Itching and Other (See Comments)    THRUSH Causes sores in mouth   Ampicillin Other (See Comments)    THRUSH  Penicillins Itching and Other (See Comments)    THRUSH- Causes sores in mouth   Strawberry Extract Swelling    LIPS SWELL   Asa [Aspirin ] Other (See Comments)    Bleeding    Bactrim [Sulfamethoxazole-Trimethoprim] Hives, Itching and Other (See Comments)    GI upset   Bran Hives   Dapagliflozin      Other reaction(s): Other (See Comments) Allergic to steroids and had mouth broke out.    Depakote  [Divalproex  Sodium]     Causes double vision and speech problems    Dilantin [Phenytoin] Other (See Comments)    Severe skin peeling   Eszopiclone  Other (See Comments)    hallucinations   Lurasidone      Other Reaction(s): Not available   Methocarbamol  Other (See Comments)     dizziness   Other     Med for prostate that startes with a M- Broke out the insid eof mouth, split the tongue, and caused throudh   Risperidone  And Paliperidone Other (See Comments)    Hallucinations    Sitagliptin Other (See Comments)    Pancreatitis    Strawberry (Diagnostic) Itching and Swelling   Sulfa Antibiotics Other (See Comments)    Affects thyroid    Tolmetin Other (See Comments)    D/t gastric ulcer   Ultram [Tramadol Hcl] Other (See Comments)    D/t gastric ulcer   Buprenorphine Itching, Rash and Other (See Comments)    Patches broke me out in red patches-  caused a fever, also   Oatmeal Hives and Other (See Comments)    White spots/sores in mouth  [2]  No current facility-administered medications on file prior to encounter.   Current Outpatient Medications on File Prior to Encounter  Medication Sig Dispense Refill   albuterol  (PROVENTIL  HFA;VENTOLIN  HFA) 108 (90 Base) MCG/ACT inhaler Inhale 1-2 puffs into the lungs every 6 (six) hours as needed for wheezing or shortness of breath. 1 Inhaler 0   ammonium lactate (LAC-HYDRIN) 12 % lotion Apply 1 Application topically 2 (two) times daily.     atorvastatin  (LIPITOR ) 80 MG tablet Take 1 tablet (80 mg total) by mouth at bedtime. (Patient taking differently: Take 80 mg by mouth daily.) 30 tablet 3   Baclofen 5 MG TABS Take 5 mg by mouth 3 (three) times daily as needed (Unknown).     brimonidine  (ALPHAGAN ) 0.2 % ophthalmic solution Place 1 drop into both eyes 2 (two) times daily.     budesonide -formoterol  (SYMBICORT ) 80-4.5 MCG/ACT inhaler Inhale 2 puffs into the lungs daily. 1 each 2   ciprofloxacin  (CIPRO ) 500 MG tablet Take 500 mg by mouth 2 (two) times daily.     diclofenac  Sodium (VOLTAREN  ARTHRITIS PAIN) 1 % GEL Apply 2 g topically 4 (four) times daily. 100 g 3   dicyclomine  (BENTYL ) 20 MG tablet Take 20 mg by mouth 2 (two) times daily.     doxycycline  (VIBRAMYCIN ) 100 MG capsule Take 100 mg by mouth 2 (two) times  daily.     Eszopiclone  3 MG TABS Take 1 tablet (3 mg total) by mouth at bedtime. 3 tablet 0   FARXIGA  10 MG TABS tablet Take 10 mg by mouth daily.     fenofibrate  (TRICOR ) 145 MG tablet TAKE 1 TABLET(145 MG) BY MOUTH DAILY 90 tablet 1   fluticasone  (FLONASE ) 50 MCG/ACT nasal spray Place 2 sprays into both nostrils daily as needed for rhinitis.     gabapentin  (NEURONTIN ) 600 MG tablet Take 1 tablet (600 mg total) by mouth daily  at 6 (six) AM. (Patient taking differently: Take 600 mg by mouth 3 (three) times daily.) 30 tablet 0   Glucagon (BAQSIMI ONE PACK NA) Place 1 spray into the nose daily as needed (Sore nose).     hydrOXYzine (ATARAX) 25 MG tablet Take 25 mg by mouth daily as needed for itching.     Insulin  Disposable Pump (OMNIPOD 5 LIBRE2 PLUS G6 PODS) MISC Inject into the skin every 3 (three) days. Humolog/Must change every three days     Lasmiditan Succinate (REYVOW) 50 MG TABS Take 1 tablet by mouth daily as needed (Migraine).     lidocaine  (HM LIDOCAINE  PATCH) 4 % Place 1 patch onto the skin daily. (Patient taking differently: Place 1 patch onto the skin daily as needed (Pain).) 10 patch 2   linaclotide  (LINZESS ) 72 MCG capsule Take 72 mcg by mouth daily before breakfast.     metFORMIN  (GLUCOPHAGE -XR) 500 MG 24 hr tablet Take 500 mg by mouth 2 (two) times daily with a meal.     methocarbamol  (ROBAXIN -750) 750 MG tablet Take 1 tablet (750 mg total) by mouth every 8 (eight) hours as needed for muscle spasms. 30 tablet 0   montelukast  (SINGULAIR ) 10 MG tablet Take 10 mg by mouth at bedtime.     mupirocin ointment (BACTROBAN) 2 % Apply 1 Application topically 3 (three) times daily.     naloxone  (NARCAN ) nasal spray 4 mg/0.1 mL Place 1 spray into the nose once.     ondansetron  (ZOFRAN ) 4 MG tablet Take 1 tablet (4 mg total) by mouth every 8 (eight) hours as needed for vomiting or nausea. 20 tablet 0   ondansetron  (ZOFRAN -ODT) 4 MG disintegrating tablet Take 1 tablet (4 mg total) by mouth  every 8 (eight) hours as needed for nausea or vomiting. 20 tablet 0   pantoprazole  (PROTONIX ) 40 MG tablet Take 40 mg by mouth in the morning.     polyethylene glycol (MIRALAX  / GLYCOLAX ) 17 g packet Take 17 g by mouth daily as needed for mild constipation or moderate constipation.     QUEtiapine  (SEROQUEL ) 25 MG tablet Take 25 mg by mouth at bedtime.     rizatriptan  (MAXALT -MLT) 10 MG disintegrating tablet Take 1 tablet (10 mg total) by mouth as needed for migraine. May repeat in 2 hours if needed 10 tablet 6   sildenafil (VIAGRA) 100 MG tablet Take 100 mg by mouth as needed for erectile dysfunction.     solifenacin (VESICARE) 10 MG tablet Take 10 mg by mouth daily.     SUMAtriptan  (IMITREX ) 100 MG tablet Take 100 mg by mouth every 2 (two) hours as needed for migraine.     tamsulosin  (FLOMAX ) 0.4 MG CAPS capsule Take 0.4 mg by mouth daily after breakfast.     Testosterone 1.62 % GEL Apply 3 Pump topically in the morning.     TOUJEO  MAX SOLOSTAR 300 UNIT/ML Solostar Pen Inject 33 Units into the skin at bedtime. (Patient taking differently: Inject 24 Units into the skin at bedtime.)     verapamil  (VERELAN ) 100 MG 24 hr capsule TAKE 1 CAPSULE(100 MG) BY MOUTH AT BEDTIME (Patient taking differently: Take 100 mg by mouth at bedtime.) 90 capsule 1   zonisamide  (ZONEGRAN ) 100 MG capsule Take 2 capsules twice a day (Patient taking differently: Take 200 mg by mouth 2 (two) times daily.) 120 capsule 6   clopidogrel  (PLAVIX ) 75 MG tablet TAKE 1 TABLET(75 MG) BY MOUTH DAILY (Patient not taking: Reported on 11/20/2024) 90 tablet 1

## 2024-11-23 NOTE — Op Note (Signed)
 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  Prior C4-7 fusion.  POST-OPERATIVE DIAGNOSIS: Same  PROCEDURE:   ACDF C3-4  SURGEON:  Donaciano Sprang, MD  PHYSICIAN ASSISTANT: Jeoffrey Sages, PA  ANESTHESIA:   General  EBL: 25 ml   Complications: None  Implants: Titan 0 profile intervertebral cage.  3.8 x 14 mm locking screw into C4.  3.8 x 16 mm locking screw into C3.  Graft: ossifuse  BRIEF HISTORY: Nicholas [name] is a 63 y.o. male who has had a prior C4-7 ACDF and presents with progressive neck pain and early signs and symptoms of myelopathy.  Imaging demonstrated adjacent segment degenerative disease at the C3-4 level.  Patient had a solid C4-7 fusion with no apparent complicating features.  As a result of his clinical exam we elected to move forward with surgery.  Risks, benefits, alternatives were discussed with the patient and consent was obtained.  PROCEDURE DETAILS: Patient was brought into the operating room and was properly positioned on the operating room table.  After induction with general anesthesia the patient was endotracheally intubated.  A timeout was taken to confirm all important data: including patient, procedure, and the level. Teds, SCD's were applied.   Anterior cervical spine was prepped and draped in a standard fashion.  The arms were protected at the side and a pressure bag was inflated underneath the shoulder blades to allow adequate visualization of the cervical spine and restoration of normal lordosis.  After the prepped and draped fluoroscopy was used to identify the C3-4 level.  I marked out my incision and infiltrated with quarter percent Marcaine .  Incision was made starting at the midline and proceeding to the left.  Sharp dissection was carried out down to and through the  platysma.  I performed a standard Smith-Robinson approach to the cervical spine.  I dissected in the avascular plane along the medial border the sternocleidomastoid.  The omohyoid was identified and released from its sling to allow for adequate visualization.  The esophagus was identified and swept medially to the right and protected with a retractor.  The carotid sheath was identified and protected with my finger.  With the anterior longitudinal ligament now visible I began using a Kitner dissectors to remove the overlying scar tissue and identified the plate at the R5-4 level.  Once the cervical plate was identified I removed the top left screw in order to facilitate the C3-4 fixation later on.  I then mobilized the longus coli muscle to expose the entire C3-4 disc space.  Caspar retracting blades were placed underneath the longus coli muscle and the endotracheal cuff was deflated.  I expanded the retractor to the appropriate width and then reinflated the cuff.  Annulotomy was performed a 15 blade scalpel and I used pituitary rongeurs curettes and Kerrison rongeurs to remove the bulk of the disc material.  Distraction pins were then placed into the body of C3 and C4 and I gently distracted the intervertebral space with a lamina spreader and maintained with a retractor.  Using curettes I then removed the remaining disc material and continue to work posteriorly.  Once identified the posterior annulus I used my 1 mm Kerrison rongeur to trim down the posterior osteophyte from the vertebral body.  A fine nerve hook was then used to dissect through the posterior annulus and posterior longitudinal ligament.  Once I created a plane between the PLL and the thecal sac I removed the PLL with the 1 mm Kerrison rongeur.  This then allowed me to undercut the uncovertebral joint and complete my decompression.  At this point I could freely pass my nerve hook along the posterior aspect of the vertebral bodies of C3 and C4 and  under the uncovertebral joints.  Using a lamina spreader I confirmed under live fluoroscopy that had parallel endplate distraction.  Once the decompression/discectomy proved satisfactory I irrigated the wound copiously normal saline and then used the trial implants to determine the best size.  The medium 7 lordotic implant provided the best overall fit with restoration of the disc height and improved space for the nerve root in the foramen.  The endplates were then rasped and I irrigated the wound copiously normal saline.  I then used a nerve hook to check 1 last time that there was no free fragments of bone that had been dislodged.  I then inserted the implant and gently malleted it to the proper resting position.  I confirmed satisfactory position with fluoroscopy.  2 locking screws were then placed 1 through the cage and into the C3 vertebral body the second through the cage and into the C4 vertebral body.  Both screws had excellent overall purchase.  I then packed additional bone graft over the implant for sentinel fusion and then placed the locking cap to prevent the screws from backing out.  The wound was then irrigated again and I confirmed hemostasis.  I removed my retractors and returned the esophagus to midline.  I then closed the platysma with interrupted 2-0 Vicryl sutures and the skin with a 3-0 Monocryl.  Steri-Strips dry dressings were applied and the patient was extubated transferred the PACU without incident.  The end of the case all needle sponge counts were correct.  There were no adverse intraoperative events.  Donaciano Sprang, MD 11/23/2024 5:29 PM

## 2024-11-23 NOTE — Anesthesia Procedure Notes (Signed)
 Procedure Name: Intubation Date/Time: 11/23/2024 3:26 PM  Performed by: Mollie Olivia SAUNDERS, CRNAPre-anesthesia Checklist: Patient identified, Emergency Drugs available, Suction available and Patient being monitored Patient Re-evaluated:Patient Re-evaluated prior to induction Oxygen  Delivery Method: Circle system utilized Preoxygenation: Pre-oxygenation with 100% oxygen  Induction Type: IV induction Ventilation: Mask ventilation without difficulty Laryngoscope Size: Glidescope and 4 Grade View: Grade I Tube type: Oral Tube size: 7.5 mm Number of attempts: 1 Airway Equipment and Method: Rigid stylet and Video-laryngoscopy Placement Confirmation: ETT inserted through vocal cords under direct vision, positive ETCO2 and breath sounds checked- equal and bilateral Tube secured with: Tape Dental Injury: Teeth and Oropharynx as per pre-operative assessment  Difficulty Due To: Difficulty was anticipated, Difficult Airway-  due to neck instability, Difficult Airway- due to reduced neck mobility and Difficult Airway- due to large tongue

## 2024-11-23 NOTE — Discharge Instructions (Signed)
 Today you will be discharged from the hospital.  The purpose of the following handout is to help guide you over the next 2 weeks.  First and foremost, be sure you have a follow up appointment with Dr. Shon Baton 2 weeks from the time of your surgery to have your sutures removed.  Please call [facility] [phone] to schedule or confirm this appointment.      Brace You do not have to wear the collar while lying in bed or sitting in a high-backed chair, eating, sleeping or showering.  Other than these instances, you must wear the brace.  You may NOT wear the collar while driving a vehicle (see driving restrictions below).  It is advisable that you wear the collar in public places or while traveling in a car as a passenger.  Dr. Shon Baton will discuss further use of the collar at your 2 week postop visit.  Wound Care You may SHOWER 5 days from the date of surgery.  Shower directly over the steri-strips.  DO NOT scrub or submerge (bath tub, swimming pool, hot tub, etc.) the area.  Pat to dry following your shower.  There is no need for additional dressings other than the steri-strips.  Allow the steri-strips to fall off on their own.  Once the strips have fallen off, you may leave the area undressed.  DO NOT apply lotion/cream/ointment to the area.  The wound must remain dry at all times other than while showering.  Dr. Shon Baton or his staff will remove your stiches at your first postop visit and give you additional instructions regarding wound care at that time.   Activity NO DRIVING FOR 2 WEEKS.  No lifting over 5 pounds (approximately a gallon of milk).  No bending, stooping, squatting or twisting.  No overhead activities.  We encourage you to walk (short distances and often throughout the day) as you can tolerate.  A good rule of thumb is to get up and move once or twice every hour.  You may go up and down stairs carefully.  As you continue to recover, Dr. Shon Baton will address and  adjust restrictions to your activities until no further restrictions are needed.  However, until your first postop visit, when Dr. Shon Baton can assess your recovery, you are to follow these instructions.  At the end of this document is a tentative outline of activities for up to 1 year.       Medication You will be discharged from the hospital with medication for pain, spasm, nausea and constipation.  You will be given enough medication to last until your first postop visit in 2 weeks.  Medications WILL NOT BE REFILLED EARLY; therefore, you are to take the medications only as directed.  If you have been given multiple prescriptions, please leave them with your pharmacy.  They can keep them on file for when you need them.  Medications that are lost or stolen WILL NOT be replaced.  We will address the need for continuing certain medications on an individual basis during your postop visit.  We ask that you avoid over the counter anti-inflammatory medications (Advil, Aleve, Motrin) for 3 months.    What you can expect following neck surgery... It is not uncommon to experience a sore throat or difficulty swallowing following neck surgery.  Cold liquids and soft foods are helpful in soothing this discomfort.  There is no specific diet that you are to follow after surgery, however, there are a  few things you should keep in mind to avoid unneeded discomfort.  Take small bites and eat slowly.  Chew your food thoroughly before swallowing.   It is not uncommon to experience incisional soreness or pain in the back of the neck, shoulders or between the shoulder blades.  These symptoms will slowly begin to resolve as you continue to recover, however, they can last for a few weeks.    It is not uncommon to experience INTERMITTENT arm pain following surgery.  This pain can mimic the arm pain you had prior to surgery.  As long as the pain resolves on its own and is not constant, there is no need to become alarmed.    When To Call If you experience fever >101F, loss of bowel or bladder control, painful swelling in the lower extremities, constant (unresolving) arm pain.  If you experience any of these symptoms, please call [facility] [phone].  What's Next As mentioned earlier, you will follow up with Dr. Shon Baton in 2 weeks.  At that time, we will likely remove your stitches and discuss additional aspects of your recovery.                   ACTIVITY GUIDELINES ANTERIOR CERVICAL DISECTOMY AND FUSION  Activity Discharge 2 weeks 6 weeks 3 months 6 months 1 year  Shower 5 days        Submerge the wound  no no yes     Walking outdoors yes       Lifting 5 lbs yes       Climbing stairs yes       Cooking yes       Car rides (less than 30 minutes) yes       Car rides (greater than 30 minutes) no varies yes     Air travel no varies yes     Short outings [employer], visits, etc...) yes       School no no yes     Driving a car no no varies yes    Light upper extremity exercises no no varies yes    Stationary bike no no yes     Swimming (no diving) no no no varies yes   Vacuuming, laundry, mopping no no no varies yes   Biking outdoors no no no no varies yes  Light jogging no no no varies yes   Low impact aerobics no no no varies yes   Non-contact sports (tennis, golf) no no no varies yes   Hunting (no tree climbing) no no no varies yes   Dancing (non-gymnastics) no no no varies yes   Down-hill skiing (experienced skier) no no no no yes   Down-hill skiing (novice) no no no no yes   Cross-country skiing no no no no yes   Horseback riding (noncompetitive)  no no no no yes   Horseback riding (competitive) no no no no varies yes  Gardening/landscaping no no no varies yes   House repairs no no no varies varies yes  Lifting up to 50 lbs no no no no varies yes

## 2024-11-23 NOTE — Anesthesia Postprocedure Evaluation (Signed)
 Anesthesia Post Note  Patient: CHANNING YEAGER  Procedure(s) Performed: ANTERIOR CERVICAL DECOMPRESSION/DISCECTOMY FUSION OF CERVICAL THREE TO CERVICAL FOUR (Spine Cervical)     Patient location during evaluation: PACU Anesthesia Type: General Level of consciousness: awake and alert Pain management: pain level controlled Vital Signs Assessment: post-procedure vital signs reviewed and stable Respiratory status: spontaneous breathing, nonlabored ventilation and respiratory function stable Cardiovascular status: blood pressure returned to baseline and stable Postop Assessment: no apparent nausea or vomiting Anesthetic complications: no   No notable events documented.  Last Vitals:  Vitals:   11/23/24 1900 11/23/24 1915  BP: (!) 139/91 (!) 130/90  Pulse: 63 62  Resp: 16 15  Temp:    SpO2: 96% 95%    Last Pain:  Vitals:   11/23/24 1830  TempSrc:   PainSc: Asleep   Pain Goal:    LLE Motor Response: (P) Purposeful movement (11/23/24 1900)   RLE Motor Response: (P) Purposeful movement (11/23/24 1900)          Alejandro Gamel A.

## 2024-11-23 NOTE — Transfer of Care (Signed)
 Immediate Anesthesia Transfer of Care Note  Patient: Nicholas [name]  Procedure(s) Performed: ANTERIOR CERVICAL DECOMPRESSION/DISCECTOMY FUSION OF CERVICAL THREE TO CERVICAL FOUR (Spine Cervical)  Patient Location: PACU  Anesthesia Type:General  Level of Consciousness: awake, drowsy, and responds to stimulation  Airway & Oxygen  Therapy: Patient Spontanous Breathing and Patient connected to face mask oxygen   Post-op Assessment: Report given to RN and Post -op Vital signs reviewed and stable  Post vital signs: Reviewed and stable  Last Vitals:  Vitals Value Taken Time  BP 97/74 11/23/24 18:00  Temp 36.4 C 11/23/24 18:00  Pulse 61 11/23/24 18:13  Resp 11 11/23/24 18:12  SpO2 100 % 11/23/24 18:13  Vitals shown include unfiled device data.  Last Pain:  Vitals:   11/23/24 1800  TempSrc:   PainSc: Asleep         Complications: No notable events documented.

## 2024-11-23 NOTE — Brief Op Note (Signed)
 08/10/2024   10:23 AM   PATIENT:  11/23/2024  5:39 PM  PATIENT:  Nicholas [name]  63 y.o. male  PRE-OPERATIVE DIAGNOSIS:  cervical riduclopathy, cervical spondyosis  POST-OPERATIVE DIAGNOSIS:  cervical riduclopathy, cervical spondyosis  PROCEDURE:  Procedures: ANTERIOR CERVICAL DECOMPRESSION/DISCECTOMY FUSION OF CERVICAL THREE TO CERVICAL FOUR (N/A)  SURGEON:  Surgeons and Role:    DEWAINE Burnetta Aures, MD - Primary  PHYSICIAN ASSISTANT: Jeoffrey Sages, PA  ANESTHESIA:   general  EBL:  25 cc   BLOOD ADMINISTERED:none  DRAINS: none   LOCAL MEDICATIONS USED:  MARCAINE      SPECIMEN:  No Specimen  DISPOSITION OF SPECIMEN:  N/A  COUNTS:  YES  TOURNIQUET:  * No tourniquets in log *  DICTATION: .Dragon Dictation  PLAN OF CARE: Admit for overnight observation  PATIENT DISPOSITION:  PACU - hemodynamically stable.

## 2024-11-24 ENCOUNTER — Other Ambulatory Visit (HOSPITAL_COMMUNITY): Payer: Self-pay

## 2024-11-24 ENCOUNTER — Ambulatory Visit: Admitting: Neurology

## 2024-11-24 ENCOUNTER — Encounter (HOSPITAL_COMMUNITY): Payer: Self-pay | Admitting: Orthopedic Surgery

## 2024-11-24 DIAGNOSIS — M5412 Radiculopathy, cervical region: Secondary | ICD-10-CM | POA: Diagnosis not present

## 2024-11-24 LAB — GLUCOSE, CAPILLARY
Glucose-Capillary: 137 mg/dL — ABNORMAL HIGH (ref 70–99)
Glucose-Capillary: 176 mg/dL — ABNORMAL HIGH (ref 70–99)
Glucose-Capillary: 175 mg/dL — ABNORMAL HIGH (ref 70–99)

## 2024-11-24 MED ORDER — OXYCODONE-ACETAMINOPHEN 10-325 MG PO TABS: 1.0000 | ORAL_TABLET | Freq: Three times a day (TID) | ORAL | 0 refills | Status: AC | PRN

## 2024-11-24 MED ORDER — METHOCARBAMOL 500 MG PO TABS: 500.0000 mg | ORAL_TABLET | Freq: Three times a day (TID) | ORAL | 0 refills | Status: AC | PRN

## 2024-11-24 NOTE — Discharge Summary (Cosign Needed)
 "  Patient ID: Nicholas [name] MRN: [id number] DOB/AGE: [birth year] 63 y.o.  Admit date: 11/23/2024 Discharge date: 11/24/2024  Admission Diagnoses:  Principal Problem:   Cervical myelopathy [facility])   Discharge Diagnoses:  Principal Problem:   Cervical myelopathy (HCC)  status post Procedures: ANTERIOR CERVICAL DECOMPRESSION/DISCECTOMY FUSION OF CERVICAL THREE TO CERVICAL FOUR  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43---  LAST SEIZURE 1 WK AGO (APPROX ,  10-31-2013)   NO NEUROLOGIST---  PT SEES PCP  DR KENARD   Feeling of incomplete bladder emptying    Frequency of urination    Gastric ulcer    GERD (gastroesophageal reflux disease)    Glaucoma    Hypertension    Hyperthyroidism    NO MEDS    Migraine    Seizures (HCC)    Sleep apnea    Stroke (HCC)    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 male who was admitted 11/23/2024 for operative treatment of Cervical myelopathy (HCC). Patient failed conservative treatments (please see the history and physical for the specifics) and had severe unremitting pain that affects sleep, daily activities and work/hobbies. After pre-op clearance, the patient was taken to the operating room on 11/23/2024 and underwent  Procedures: ANTERIOR CERVICAL DECOMPRESSION/DISCECTOMY FUSION OF CERVICAL THREE TO CERVICAL FOUR.    Patient was given perioperative antibiotics:   Anti-infectives (From admission, onward)    Start     Dose/Rate Route Frequency Ordered Stop   11/23/24 2200  vancomycin  (VANCOCIN ) 1,000 mg in sodium chloride  0.9 % 250 mL IVPB  Status:  Discontinued        1,000 mg 250 mL/hr over 60 Minutes Intravenous Every 12 hours 11/23/24 1551 11/23/24 1606   11/23/24 2200  ceFAZolin  (ANCEF ) IVPB 1 g/50 mL premix        1 g 100 mL/hr over 30 Minutes Intravenous Every 8 hours 11/23/24 2039 11/24/24 0537   11/23/24 1615  vancomycin  (VANCOCIN ) IVPB 1000 mg/200 mL premix  Status:  Discontinued        1,000 mg 200 mL/hr over 60 Minutes Intravenous To Surgery 11/23/24 1606 11/23/24 2007   11/23/24 1342  ceFAZolin  (ANCEF ) IVPB 2g/100 mL premix        2 g 200 mL/hr over 30 Minutes Intravenous 30 min pre-op 11/23/24 1342 11/23/24 1539        Patient was given sequential compression devices and early ambulation to prevent DVT.   Patient benefited maximally from hospital stay and there were no complications. At the time of discharge, the patient was urinating/moving their bowels without difficulty, tolerating a regular diet, pain is controlled with oral pain medications and they have been cleared by PT/OT.   Recent vital signs: Patient Vitals for the past 24 hrs:  BP Temp Temp src Pulse Resp SpO2  Height Weight  11/24/24 0503 (!) 136/99 98.1 F (36.7 C) Oral 89 18 95 % -- --  11/24/24 0001 139/82 98.5 F (36.9 C) Oral 73 20 95 % -- --  11/23/24 2015 (!) 144/95 98.3 F (36.8 C) Oral 64 16 99 % -- --  11/23/24 1945 (!) 135/91 (!) 97.2 F (36.2 C) -- 63 13 95 % -- --  11/23/24 1930 (!) 135/94 -- -- 63 13 96 % -- --  11/23/24 1915 (!) 130/90 -- -- 62 15 95 % -- --  11/23/24 1900 (!) 139/91 -- -- 63 16 96 % -- --  11/23/24 1845 (!) 132/91 -- -- 63 14 97 % -- --  11/23/24 1830 (!) 129/93 -- -- 63 14 100 % -- --  11/23/24 1825 -- -- -- -- -- 99 % -- --  11/23/24 1815 (!) 105/92 -- -- 62 18 100 % -- --  11/23/24 1800 97/74 97.6 F (36.4 C) -- 65 14 98 %  -- --  11/23/24 1324 123/87 97.8 F (36.6 C) Oral 73 17 96 % 5' 7 (1.702 m) 91.6 kg     Recent laboratory studies:  Recent Labs    11/22/24 1451  WBC 5.3  HGB 16.0  HCT 50.2  PLT 172  NA 139  K 4.2  CL 107  CO2 23  BUN 16  CREATININE 1.39*  GLUCOSE 113*  CALCIUM  9.9     Discharge Medications:   Allergies as of 11/24/2024       Reactions   Finasteride Other (See Comments)   Break out   Levothyroxine Anaphylaxis, Cough   Chronic cough   Nsaids Other (See Comments)   D/t gastric ulcer   Prednisone  Itching, Other (See Comments), Anaphylaxis   Throat swelling and cough   Valproic Acid  Itching, Other (See Comments)   Other Reaction(s): Not available Other Reaction(s): Not available, Other (See Comments), Other (See Comments)    Causes double vision and speech problems   Causes double vision and speech problems    Kidney issues   Kidney issues    Causes double vision and speech problems   Causes double vision and speech problems   Causes double vision and speech problems  Causes double vision and speech problems    Kidney issues   Kidney issues   Kidney issues  Kidney issues    Causes double vision and speech problems     Kidney issues     calcium  valproate    sodium valproate    divalproex  sodium sodium valproate calcium  valproate   Amoxicillin Itching, Other (See Comments)   THRUSH Causes sores in mouth   Ampicillin Other (See Comments)   THRUSH   Penicillins Itching, Other (See Comments)   THRUSH- Causes sores in mouth   Strawberry Extract Swelling   LIPS SWELL   Asa [aspirin ] Other (See Comments)   Bleeding   Bactrim [sulfamethoxazole-trimethoprim] Hives, Itching, Other (See Comments)   GI upset   Bran Hives   Dapagliflozin     Other reaction(s): Other (See Comments) Allergic to steroids and had mouth broke out.    Depakote  [divalproex  Sodium]    Causes double vision and speech problems    Dilantin [phenytoin] Other (See Comments)   Severe  skin peeling   Eszopiclone  Other (See Comments)   hallucinations   Lurasidone     Other Reaction(s): Not available   Methocarbamol  Other (See Comments)   dizziness   Other    Med for prostate that startes with a  M- Broke out the insid eof mouth, split the tongue, and caused throudh   Risperidone  And Paliperidone Other (See Comments)   Hallucinations   Sitagliptin Other (See Comments)   Pancreatitis   Strawberry (diagnostic) Itching, Swelling   Sulfa Antibiotics Other (See Comments)   Affects thyroid   Tolmetin Other (See Comments)   D/t gastric ulcer   Ultram [tramadol Hcl] Other (See Comments)   D/t gastric ulcer   Buprenorphine Itching, Rash, Other (See Comments)   Patches broke me out in red patches-  caused a fever, also   Oatmeal Hives, Other (See Comments)   White spots/sores in mouth        Medication List     PAUSE taking these medications    oxyCODONE -acetaminophen  10-325 MG tablet Wait to take this until your doctor or other care provider tells you to start again. Commonly known as: PERCOCET Take 1 tablet by mouth 4 (four) times daily as directed for 30 days You also have another medication with the same name that you may need to continue taking.       STOP taking these medications    ammonium lactate 12 % lotion Commonly known as: LAC-HYDRIN   clopidogrel  75 MG tablet Commonly known as: PLAVIX    cyclobenzaprine  10 MG tablet Commonly known as: FLEXERIL    diclofenac  Sodium 1 % Gel Commonly known as: Voltaren  Arthritis Pain   lidocaine  4 % Commonly known as: HM Lidocaine  Patch   mupirocin ointment 2 % Commonly known as: BACTROBAN       TAKE these medications    albuterol  108 (90 Base) MCG/ACT inhaler Commonly known as: VENTOLIN  HFA Inhale 1-2 puffs into the lungs every 6 (six) hours as needed for wheezing or shortness of breath.   atorvastatin  80 MG tablet Commonly known as: LIPITOR  Take 1 tablet (80 mg total) by mouth at  bedtime. What changed: when to take this   Baclofen 5 MG Tabs Take 5 mg by mouth 3 (three) times daily as needed (Unknown).   BAQSIMI ONE PACK NA Place 1 spray into the nose daily as needed (Sore nose).   brimonidine  0.2 % ophthalmic solution Commonly known as: ALPHAGAN  Place 1 drop into both eyes 2 (two) times daily.   budesonide -formoterol  80-4.5 MCG/ACT inhaler Commonly known as: SYMBICORT  Inhale 2 puffs into the lungs daily.   ciprofloxacin  500 MG tablet Commonly known as: CIPRO  Take 500 mg by mouth 2 (two) times daily.   dicyclomine  20 MG tablet Commonly known as: BENTYL  Take 20 mg by mouth 2 (two) times daily.   doxycycline  100 MG capsule Commonly known as: VIBRAMYCIN  Take 100 mg by mouth 2 (two) times daily.   Eszopiclone  3 MG Tabs Take 1 tablet (3 mg total) by mouth at bedtime.   Farxiga  10 MG Tabs tablet Generic drug: dapagliflozin  propanediol Take 10 mg by mouth daily.   fenofibrate  145 MG tablet Commonly known as: TRICOR  TAKE 1 TABLET(145 MG) BY MOUTH DAILY   fluticasone  50 MCG/ACT nasal spray Commonly known as: FLONASE  Place 2 sprays into both nostrils daily as needed for rhinitis.   gabapentin  600 MG tablet Commonly known as: NEURONTIN  Take 1 tablet (600 mg total) by mouth daily at 6 (six) AM. What changed: when to take this   hydrOXYzine 25 MG tablet Commonly known as: ATARAX Take 25 mg by mouth daily as needed for itching.   Linzess  72 MCG capsule Generic drug: linaclotide  Take 72 mcg by mouth daily before breakfast.   metFORMIN  500 MG  24 hr tablet Commonly known as: GLUCOPHAGE -XR Take 500 mg by mouth 2 (two) times daily with a meal.   methocarbamol  500 MG tablet Commonly known as: ROBAXIN  Take 1 tablet (500 mg total) by mouth every 8 (eight) hours as needed for up to 5 days for muscle spasms. What changed:  medication strength how much to take   montelukast  10 MG tablet Commonly known as: SINGULAIR  Take 10 mg by mouth at bedtime.    naloxone  4 MG/0.1ML Liqd nasal spray kit Commonly known as: NARCAN  Place 1 spray into the nose once.   Omnipod 5 Libre2 Plus G6 Pods Misc Inject into the skin every 3 (three) days. Humolog/Must change every three days   ondansetron  4 MG disintegrating tablet Commonly known as: ZOFRAN -ODT Take 1 tablet (4 mg total) by mouth every 8 (eight) hours as needed for nausea or vomiting.   ondansetron  4 MG tablet Commonly known as: Zofran  Take 1 tablet (4 mg total) by mouth every 8 (eight) hours as needed for vomiting or nausea.   oxyCODONE -acetaminophen  10-325 MG tablet Commonly known as: Percocet Take 1 tablet by mouth every 8 (eight) hours as needed for up to 5 days for pain. What changed: Another medication with the same name was paused. Ask your nurse or doctor if you should take this medication.   pantoprazole  40 MG tablet Commonly known as: PROTONIX  Take 40 mg by mouth in the morning.   polyethylene glycol 17 g packet Commonly known as: MIRALAX  / GLYCOLAX  Take 17 g by mouth daily as needed for mild constipation or moderate constipation.   QUEtiapine  25 MG tablet Commonly known as: SEROQUEL  Take 25 mg by mouth at bedtime.   Reyvow 50 MG Tabs Generic drug: Lasmiditan Succinate Take 1 tablet by mouth daily as needed (Migraine).   rizatriptan  10 MG disintegrating tablet Commonly known as: MAXALT -MLT Take 1 tablet (10 mg total) by mouth as needed for migraine. May repeat in 2 hours if needed   sildenafil 100 MG tablet Commonly known as: VIAGRA Take 100 mg by mouth as needed for erectile dysfunction.   solifenacin 10 MG tablet Commonly known as: VESICARE Take 10 mg by mouth daily.   SUMAtriptan  100 MG tablet Commonly known as: IMITREX  Take 100 mg by mouth every 2 (two) hours as needed for migraine.   tamsulosin  0.4 MG Caps capsule Commonly known as: FLOMAX  Take 0.4 mg by mouth daily after breakfast.   Testosterone 1.62 % Gel Apply 3 Pump topically in the morning.    Toujeo  Max SoloStar 300 UNIT/ML Solostar Pen Generic drug: insulin  glargine (2 Unit Dial) Inject 33 Units into the skin at bedtime. What changed: how much to take   verapamil  100 MG 24 hr capsule Commonly known as: VERELAN  TAKE 1 CAPSULE(100 MG) BY MOUTH AT BEDTIME What changed:  how much to take how to take this when to take this additional instructions   zonisamide  100 MG capsule Commonly known as: ZONEGRAN  Take 2 capsules twice a day What changed:  how much to take how to take this when to take this additional instructions        Diagnostic Studies: DG Cervical Spine 2 or 3 views Result Date: 11/23/2024 CLINICAL DATA:  Clinically surgery. EXAM: CERVICAL SPINE - 2-3 VIEW COMPARISON:  Cervical spine radiograph dated 09/30/2020. FINDINGS: Intraoperative fluoroscopic spot images of the cervical spine provided. The total fluoroscopic time is 1 minute 32 seconds with a cumulative air kerma of 7.6 mGy. Spinal fusion noted. IMPRESSION: Intraoperative fluoroscopic spot  images of the cervical spine. Electronically Signed   By: Vanetta Chou M.D.   On: 11/23/2024 20:36   DG C-Arm 1-60 Min-No Report Result Date: 11/23/2024 Fluoroscopy was utilized by the requesting physician.  No radiographic interpretation.   DG C-Arm 1-60 Min-No Report Result Date: 11/23/2024 Fluoroscopy was utilized by the requesting physician.  No radiographic interpretation.   DG C-Arm 1-60 Min-No Report Result Date: 11/23/2024 Fluoroscopy was utilized by the requesting physician.  No radiographic interpretation.       Follow-up Information     Burnetta Aures, MD. Schedule an appointment as soon as possible for a visit in 2 week(s).   Specialty: Orthopedic Surgery Why: If symptoms worsen, For suture removal, For wound re-check Contact information: [address] [place] [address] [phone]                 Discharge Plan:  discharge to home today pending positive  flatus   Disposition:  Burman is a very pleasant 63 year old male  who is POD1 from ACDF C3-C4. Surgical intervention was successful and without complications. His hospital course has been uncomplicated. He states that his pre-operative arm pain is much improved since surgery. He is ambulating on his own. he is tolerating oral intake well. he is compliant with the ASPEN Collar. he is complaint with the incentive spirometer. he reports that his pain is well controled with oral pain medications. Dressing is c/d/I.  Positive void, positive flatus, positive BM. Patient will follow up with us  in clinic in 2 weeks. Patient understands that he cannot shower for the first 5 days post-op. All questions were welcomed and addressed.   Will d/c to home pending positive flatus    Signed: Jeoffrey LOISE Sages PA-C for Dr. Aures Burnetta [facility] [phone] 11/24/2024, 7:09 AM      "

## 2024-11-24 NOTE — Progress Notes (Signed)
 Patient alert and oriented, mae's well, voiding adequate amount of urine, swallowing without difficulty, no c/o pain at time of discharge. Patient discharged home. Script and discharged instructions given to patient. Patient stated understanding of instructions given. Room was checked and accounted for all patient's belongings; discharge instructions concerning his medications, incision care, follow up appointment and when to call the doctor as needed were all discussed with patient by RN and he expressed understanding on the instructions given. Patient's transport ride was here for pick-up.

## 2024-11-24 NOTE — Evaluation (Signed)
 Occupational Therapy Evaluation Patient Details Name: Nicholas [name] MRN: [id number] DOB: [birth year] Today's Date: 11/24/2024   History of Present Illness   Pt is a 63 y/o male presenting on 12/18 for same day ACDF C3-4. PMH includes: PMH: asthma, bipolar disorder, schizophrenia, chronic pain, seizures/pseudoseizures, conversion disorder, cervical myelopathy, GERD, hypertension, hyperlipidemia, hyperthyroidism, hemiplegic migraine, TIA, insulin -dependent type 2 diabetes, CKD stage IIIa, BPH     Clinical Impressions Patient admitted for above and presents with problem list below.  PTA pt was independent with ADLs given increased time but has aide support for tub transfers/IADLs, using cane vs RW for mobility. Patient was educated on cervical brace mgmt and wear schedule, cervical precautions, ADL compensatory techniques, AE/DME, mobility progression, safety and recommendations.  Today, pt demonstrated ability to complete bed mobility with modified independence using rails and HOB elevated, transfers using cane with supervision given cueing for L hand placement, functional mobility using supervision with cane, and ADLs with up to min assist.  He does demonstrate ability to don cervical brace with increased time and verbal cueing, educated on importance mobility throughout the day.  At discharge, pt will have support from aide for 2 1/2 hours/day.  Based on performance today, will benefit from further acute OT to optimize independence and safety.  OT will follow acutely.       If plan is discharge home, recommend the following:   A little help with walking and/or transfers;A little help with bathing/dressing/bathroom;Assistance with cooking/housework;Assist for transportation;Help with stairs or ramp for entrance     Functional Status Assessment         Equipment Recommendations   BSC/3in1     Recommendations for Other Services   PT consult     Precautions/Restrictions    Precautions Precautions: Fall;Cervical Precaution Booklet Issued: Yes (comment) Recall of Precautions/Restrictions: Impaired Precaution/Restrictions Comments: reviewed with pt, requires cueing to adhere Required Braces or Orthoses: Cervical Brace Cervical Brace: Hard collar (OOB) Restrictions Weight Bearing Restrictions Per Provider Order: No     Mobility Bed Mobility Overal bed mobility: Modified Independent             General bed mobility comments: HOB elevated, long sitting to EOB    Transfers Overall transfer level: Needs assistance Equipment used: Straight cane Transfers: Sit to/from Stand Sit to Stand: Contact guard assist, Supervision           General transfer comment: using cane in L hand needing contact guard, cueing for hand placement on bed before reaching for cane with supervision      Balance Overall balance assessment: Mild deficits observed, not formally tested                                         ADL either performed or assessed with clinical judgement   ADL Overall ADL's : Needs assistance/impaired     Grooming: Supervision/safety;Wash/dry hands;Standing           Upper Body Dressing : Supervision/safety;Sitting;Cueing for compensatory techniques   Lower Body Dressing: Supervision/safety;Sit to/from stand   Toilet Transfer: Supervision/safety;Ambulation Toilet Transfer Details (indicate cue type and reason): cane Toileting- Clothing Manipulation and Hygiene: Supervision/safety;Sit to/from Nurse, Children's Details (indicate cue type and reason): plans to have aide assist Functional mobility during ADLs: Contact guard assist;Supervision/safety;Cane       Vision   Vision Assessment?: No apparent visual deficits  Perception         Praxis         Pertinent Vitals/Pain Pain Assessment Pain Assessment: Faces Faces Pain Scale: Hurts little more Pain Location: throat Pain Descriptors /  Indicators: Discomfort, Operative site guarding Pain Intervention(s): Limited activity within patient's tolerance, Monitored during session, Repositioned     Extremity/Trunk Assessment Upper Extremity Assessment Upper Extremity Assessment: Right hand dominant;RUE deficits/detail RUE Deficits / Details: reports he cannot use UE from nerve injury and needing surgery; pt does demonstrate ability to use functionally for self feeding, and donning cervical collar but when during testing reports he cannot feel it or move it'. RUE Sensation: decreased light touch RUE Coordination: decreased fine motor;decreased gross motor   Lower Extremity Assessment Lower Extremity Assessment: Defer to PT evaluation   Cervical / Trunk Assessment Cervical / Trunk Assessment: Neck Surgery   Communication Communication Communication: No apparent difficulties   Cognition Arousal: Alert Behavior During Therapy: WFL for tasks assessed/performed Cognition: No apparent impairments                               Following commands: Intact       Cueing  General Comments   Cueing Techniques: Verbal cues      Exercises     Shoulder Instructions      Home Living Family/patient expects to be discharged to:: Private residence Living Arrangements: Alone Available Help at Discharge: Personal care attendant (2 1/2 hours/day) Type of Home: House Home Access: Stairs to enter [employer] of Steps: 2 Entrance Stairs-Rails: None Home Layout: One level     Bathroom Shower/Tub: Chief Strategy Officer: Standard     Home Equipment: Agricultural Consultant (2 wheels);Cane - single point ([place] that does not fit in shower)          Prior Functioning/Environment Prior Level of Function : Independent/Modified Independent             Mobility Comments: using cane in commuinty and RW at home. ADLs Comments: reports managing ADls with signifincantly incresaed time, just started  having aide assist him with shower transfers and IADls.  Uses transportation services for appointments.    OT Problem List: Decreased strength;Decreased activity tolerance;Impaired balance (sitting and/or standing);Decreased range of motion;Decreased coordination;Decreased safety awareness;Decreased knowledge of use of DME or AE;Decreased knowledge of precautions;Impaired sensation;Obesity;Impaired UE functional use   OT Treatment/Interventions: Self-care/ADL training;Therapeutic exercise;DME and/or AE instruction;Therapeutic activities;Patient/family education;Balance training      OT Goals(Current goals can be found in the care plan section)   Acute Rehab OT Goals Patient Stated Goal: get home OT Goal Formulation: With patient Time For Goal Achievement: 12/08/24 Potential to Achieve Goals: Good   OT Frequency:  Min 2X/week    Co-evaluation              AM-PAC OT 6 Clicks Daily Activity     Outcome Measure Help from another person eating meals?: A Little Help from another person taking care of personal grooming?: A Little Help from another person toileting, which includes using toliet, bedpan, or urinal?: A Little Help from another person bathing (including washing, rinsing, drying)?: A Little Help from another person to put on and taking off regular upper body clothing?: A Little Help from another person to put on and taking off regular lower body clothing?: A Little 6 Click Score: 18   End of Session Equipment Utilized During Treatment: Cervical collar;Other (comment) (cane) Nurse  Communication: Mobility status;Precautions  Activity Tolerance: Patient tolerated treatment well Patient left: in chair;with call bell/phone within reach  OT Visit Diagnosis: Other abnormalities of gait and mobility (R26.89);Muscle weakness (generalized) (M62.81);Pain Pain - part of body:  (throat)                Time: 9169-9142 OT Time Calculation (min): 27 min Charges:  OT General  Charges $OT Visit: 1 Visit OT Evaluation $OT Eval Low Complexity: 1 Low OT Treatments $Self Care/Home Management : 8-22 mins  Etta NOVAK, OT Acute Rehabilitation Services Office [phone] Secure Chat Preferred    Etta GORMAN Hope 11/24/2024, 9:49 AM

## 2024-11-24 NOTE — Plan of Care (Signed)
  Problem: Education: Goal: Knowledge of General Education information will improve Description: Including pain rating scale, medication(s)/side effects and non-pharmacologic comfort measures Outcome: Completed/Met   Problem: Health Behavior/Discharge Planning: Goal: Ability to manage health-related needs will improve Outcome: Completed/Met   Problem: Clinical Measurements: Goal: Ability to maintain clinical measurements within normal limits will improve Outcome: Completed/Met Goal: Will remain free from infection Outcome: Completed/Met Goal: Diagnostic test results will improve Outcome: Completed/Met Goal: Respiratory complications will improve Outcome: Completed/Met Goal: Cardiovascular complication will be avoided Outcome: Completed/Met   Problem: Activity: Goal: Risk for activity intolerance will decrease Outcome: Completed/Met   Problem: Nutrition: Goal: Adequate nutrition will be maintained Outcome: Completed/Met   Problem: Coping: Goal: Level of anxiety will decrease Outcome: Completed/Met   Problem: Elimination: Goal: Will not experience complications related to bowel motility Outcome: Completed/Met Goal: Will not experience complications related to urinary retention Outcome: Completed/Met   Problem: Pain Managment: Goal: General experience of comfort will improve and/or be controlled Outcome: Completed/Met   Problem: Safety: Goal: Ability to remain free from injury will improve Outcome: Completed/Met   Problem: Skin Integrity: Goal: Risk for impaired skin integrity will decrease Outcome: Completed/Met   Problem: Education: Goal: Ability to describe self-care measures that may prevent or decrease complications (Diabetes Survival Skills Education) will improve Outcome: Completed/Met Goal: Individualized Educational Video(s) Outcome: Completed/Met   Problem: Coping: Goal: Ability to adjust to condition or change in health will improve Outcome:  Completed/Met   Problem: Fluid Volume: Goal: Ability to maintain a balanced intake and output will improve Outcome: Completed/Met   Problem: Health Behavior/Discharge Planning: Goal: Ability to identify and utilize available resources and services will improve Outcome: Completed/Met Goal: Ability to manage health-related needs will improve Outcome: Completed/Met   Problem: Metabolic: Goal: Ability to maintain appropriate glucose levels will improve Outcome: Completed/Met   Problem: Nutritional: Goal: Maintenance of adequate nutrition will improve Outcome: Completed/Met Goal: Progress toward achieving an optimal weight will improve Outcome: Completed/Met   Problem: Skin Integrity: Goal: Risk for impaired skin integrity will decrease Outcome: Completed/Met   Problem: Tissue Perfusion: Goal: Adequacy of tissue perfusion will improve Outcome: Completed/Met   Problem: Education: Goal: Ability to verbalize activity precautions or restrictions will improve Outcome: Completed/Met Goal: Knowledge of the prescribed therapeutic regimen will improve Outcome: Completed/Met Goal: Understanding of discharge needs will improve Outcome: Completed/Met   Problem: Activity: Goal: Ability to avoid complications of mobility impairment will improve Outcome: Completed/Met Goal: Ability to tolerate increased activity will improve Outcome: Completed/Met Goal: Will remain free from falls Outcome: Completed/Met   Problem: Bowel/Gastric: Goal: Gastrointestinal status for postoperative course will improve Outcome: Completed/Met   Problem: Clinical Measurements: Goal: Ability to maintain clinical measurements within normal limits will improve Outcome: Completed/Met Goal: Postoperative complications will be avoided or minimized Outcome: Completed/Met Goal: Diagnostic test results will improve Outcome: Completed/Met   Problem: Pain Management: Goal: Pain level will decrease Outcome:  Completed/Met   Problem: Skin Integrity: Goal: Will show signs of wound healing Outcome: Completed/Met   Problem: Health Behavior/Discharge Planning: Goal: Identification of resources available to assist in meeting health care needs will improve Outcome: Completed/Met   Problem: Bladder/Genitourinary: Goal: Urinary functional status for postoperative course will improve Outcome: Completed/Met

## 2024-11-24 NOTE — Evaluation (Signed)
 Physical Therapy Evaluation  Patient Details Name: Nicholas [name] MRN: [id number] DOB: Feb 12, 1961 Today's Date: 11/24/2024  History of Present Illness  Pt is a 63 y/o male presenting on 12/18 for same day ACDF C3-4. PMH includes: PMH: asthma, bipolar disorder, schizophrenia, chronic pain, seizures/pseudoseizures, conversion disorder, cervical myelopathy, hypertension, hyperlipidemia, hyperthyroidism, hemiplegic migraine, TIA, insulin -dependent type 2 diabetes, CKD stage IIIa, BPH   Clinical Impression  Pt admitted with above diagnosis. At the time of PT eval, pt was able to demonstrate transfers and ambulation with gross supervision to CGA and RW for support. Pt was educated on precautions, brace application/wearing schedule, appropriate activity progression, and car transfer. Pt currently with functional limitations due to the deficits listed below (see PT Problem List). Pt will benefit from skilled PT to increase their independence and safety with mobility to allow discharge to the venue listed below.          If plan is discharge home, recommend the following: A little help with walking and/or transfers;A little help with bathing/dressing/bathroom;Assistance with cooking/housework;Assist for transportation;Help with stairs or ramp for entrance   Can travel by private vehicle        Equipment Recommendations BSC/3in1  Recommendations for Other Services       Functional Status Assessment Patient has had a recent decline in their functional status and demonstrates the ability to make significant improvements in function in a reasonable and predictable amount of time.     Precautions / Restrictions Precautions Precautions: Fall;Cervical Precaution Booklet Issued: Yes (comment) Recall of Precautions/Restrictions: Impaired Precaution/Restrictions Comments: Reviewed handout and pt was cued for precautions during functional mobility. Required Braces or Orthoses: Cervical Brace Cervical  Brace: Hard collar (OOB) Restrictions Weight Bearing Restrictions Per Provider Order: No      Mobility  Bed Mobility Overal bed mobility: Modified Independent             General bed mobility comments: HOB elevated, long sitting to EOB. VC's for log roll if he is sleeping in a standard bed at home.    Transfers Overall transfer level: Needs assistance Equipment used: Rolling walker (2 wheels) Transfers: Sit to/from Stand Sit to Stand: Contact guard assist, Supervision           General transfer comment: Progressing to supervision for safety by end of session. Pt demonstrating good hand placement on seated surface for safety.    Ambulation/Gait Ambulation/Gait assistance: Contact guard assist Gait Distance (Feet): 300 Feet Assistive device: Rolling walker (2 wheels) Gait Pattern/deviations: Step-through pattern, Decreased stride length, Trunk flexed Gait velocity: Decreased Gait velocity interpretation: 1.31 - 2.62 ft/sec, indicative of limited community ambulator   General Gait Details: VC's for improved posture, closer walker proximity and forward gaze. No assist required.  Stairs Stairs: Yes Stairs assistance: Contact guard assist Stair Management: One rail Right, Step to pattern, Forwards Number of Stairs: 2    Wheelchair Mobility     Tilt Bed    Modified Rankin (Stroke Patients Only)       Balance Overall balance assessment: Mild deficits observed, not formally tested                                           Pertinent Vitals/Pain Pain Assessment Pain Assessment: Faces Faces Pain Scale: Hurts little more Pain Location: throat/neck Pain Descriptors / Indicators: Discomfort, Operative site guarding Pain Intervention(s): Limited activity within patient's tolerance, Monitored during  session, Repositioned    Home Living Family/patient expects to be discharged to:: Private residence Living Arrangements: Alone Available Help at  Discharge: Personal care attendant (2 1/2 hours/day) Type of Home: House Home Access: Stairs to enter Entrance Stairs-Rails: None Entrance Stairs-Number of Steps: 2   Home Layout: One level Home Equipment: [employer]ant (2 wheels);Cane - single point ([place] that does not fit in shower)      Prior Function Prior Level of Function : Independent/Modified Independent             Mobility Comments: using cane in community and RW at home. ADLs Comments: reports managing ADls with signifincantly incresaed time, just started having aide assist him with shower transfers and IADls.  Uses transportation services for appointments.     Extremity/Trunk Assessment   Upper Extremity Assessment Upper Extremity Assessment: Defer to OT evaluation    Lower Extremity Assessment Lower Extremity Assessment: Generalized weakness    Cervical / Trunk Assessment Cervical / Trunk Assessment: Neck Surgery  Communication   Communication Communication: Impaired Factors Affecting Communication: Reduced clarity of speech (very hoarse voice)    Cognition Arousal: Alert Behavior During Therapy: WFL for tasks assessed/performed   PT - Cognitive impairments: No apparent impairments                         Following commands: Intact       Cueing Cueing Techniques: Verbal cues     General Comments      Exercises     Assessment/Plan    PT Assessment Patient needs continued PT services  PT Problem List Decreased strength;Decreased activity tolerance;Decreased balance;Decreased mobility;Decreased knowledge of use of DME;Decreased safety awareness;Decreased knowledge of precautions;Pain       PT Treatment Interventions DME instruction;Stair training;Gait training;Functional mobility training;Therapeutic activities;Therapeutic exercise;Balance training;Patient/family education    PT Goals (Current goals can be found in the Care Plan section)  Acute Rehab PT Goals Patient Stated Goal:  Home today PT Goal Formulation: With patient Time For Goal Achievement: 12/01/24 Potential to Achieve Goals: Good    Frequency Min 5X/week     Co-evaluation               AM-PAC PT 6 Clicks Mobility  Outcome Measure Help needed turning from your back to your side while in a flat bed without using bedrails?: None Help needed moving from lying on your back to sitting on the side of a flat bed without using bedrails?: None Help needed moving to and from a bed to a chair (including a wheelchair)?: A Little Help needed standing up from a chair using your arms (e.g., wheelchair or bedside chair)?: A Little Help needed to walk in hospital room?: A Little Help needed climbing 3-5 steps with a railing? : A Little 6 Click Score: 20    End of Session Equipment Utilized During Treatment: Gait belt;Cervical collar Activity Tolerance: Patient tolerated treatment well Patient left: in chair;with call bell/phone within reach Nurse Communication: Mobility status PT Visit Diagnosis: Unsteadiness on feet (R26.81);Pain Pain - part of body:  (back)    Time: 1130-1155 PT Time Calculation (min) (ACUTE ONLY): 25 min   Charges:   PT Evaluation $PT Eval Low Complexity: 1 Low PT Treatments $Gait Training: 8-22 mins PT General Charges $$ ACUTE PT VISIT: 1 Visit         Leita Sable, PT, DPT Acute Rehabilitation Services Secure Chat Preferred Office: [phone]   Leita JONETTA Sable 11/24/2024, 1:28 PM

## 2024-11-24 NOTE — Care Management Obs Status (Signed)
 MEDICARE OBSERVATION STATUS NOTIFICATION   Patient Details  Name: Nicholas [name] MRN: [id number] Date of Birth: [birth year]   Medicare Observation Status Notification Given:  Yes    [name] 11/24/2024, 1:26 PM

## 2024-11-24 NOTE — Inpatient Diabetes Management (Signed)
 Inpatient Diabetes Program Recommendations  AACE/ADA: New Consensus Statement on Inpatient Glycemic Control (2015)  Target Ranges:  Prepandial:   less than 140 mg/dL      Peak postprandial:   less than 180 mg/dL (1-2 hours)      Critically ill patients:  140 - 180 mg/dL   Lab Results  Component Value Date   GLUCAP 175 (H) 11/24/2024   HGBA1C 8.0 (H) 11/22/2024    Review of Glycemic Control  Latest Reference Range & Units 11/23/24 15:07 11/23/24 18:02 11/23/24 21:20 11/24/24 03:03 11/24/24 06:43 11/24/24 11:55  Glucose-Capillary 70 - 99 mg/dL 79 893 (H) 876 (H) 862 (H) 176 (H) 175 (H)   Diabetes history: DM 2 Outpatient Diabetes medications:  Omni Pod 5 Insulin  Pump Freestyle Libre 2 CGM Toujeo  24 units at HS Current orders for Inpatient glycemic control:  Novolog  0-15 units tid with meals and HS Lantus  24 units q HS Metformin -XR 500 mg bid  Inpatient Diabetes Program Recommendations:    Blood sugars are currently well controlled. Insulin  pump off at this time.  Would recommend waiting to restart insulin  pump until 24 hours after basal insulin .  Will follow.   Thanks,  Randall Bullocks, RN, BC-ADM Inpatient Diabetes Coordinator Pager [phone]  (8a-5p)

## 2024-11-27 ENCOUNTER — Other Ambulatory Visit (HOSPITAL_COMMUNITY): Payer: Self-pay

## 2024-11-27 MED ORDER — CYCLOBENZAPRINE HCL 10 MG PO TABS
10.0000 mg | ORAL_TABLET | Freq: Three times a day (TID) | ORAL | 1 refills | Status: DC
  Filled 2024-11-27: qty 90, 30d supply, fill #0

## 2024-11-28 ENCOUNTER — Other Ambulatory Visit: Payer: Self-pay

## 2024-12-06 ENCOUNTER — Other Ambulatory Visit (HOSPITAL_COMMUNITY): Payer: Self-pay

## 2024-12-06 MED ORDER — OXYCODONE-ACETAMINOPHEN 10-325 MG PO TABS
1.0000 | ORAL_TABLET | Freq: Three times a day (TID) | ORAL | 0 refills | Status: AC | PRN
  Filled 2024-12-11 – 2024-12-12 (×2): qty 21, 7d supply, fill #0

## 2024-12-06 MED ORDER — METHOCARBAMOL 500 MG PO TABS: 500.0000 mg | ORAL_TABLET | Freq: Three times a day (TID) | ORAL | 0 refills | Status: AC | PRN

## 2024-12-11 ENCOUNTER — Other Ambulatory Visit (HOSPITAL_COMMUNITY): Payer: Self-pay

## 2024-12-11 ENCOUNTER — Other Ambulatory Visit: Payer: Self-pay

## 2024-12-11 MED ORDER — OXYCODONE-ACETAMINOPHEN 10-325 MG PO TABS
1.0000 | ORAL_TABLET | Freq: Three times a day (TID) | ORAL | 0 refills | Status: AC | PRN
  Filled 2024-12-11: qty 90, 30d supply, fill #0
  Filled 2024-12-18: qty 21, 7d supply, fill #0

## 2024-12-11 MED ORDER — METHOCARBAMOL 500 MG PO TABS
500.0000 mg | ORAL_TABLET | Freq: Three times a day (TID) | ORAL | 0 refills | Status: AC | PRN
  Filled 2024-12-11 – 2024-12-12 (×2): qty 90, 30d supply, fill #0

## 2024-12-12 ENCOUNTER — Encounter (HOSPITAL_COMMUNITY): Payer: Self-pay | Admitting: Pharmacist

## 2024-12-12 ENCOUNTER — Other Ambulatory Visit: Payer: Self-pay

## 2024-12-12 ENCOUNTER — Other Ambulatory Visit (HOSPITAL_COMMUNITY): Payer: Self-pay

## 2024-12-13 ENCOUNTER — Other Ambulatory Visit: Payer: Self-pay

## 2024-12-13 ENCOUNTER — Encounter: Payer: Self-pay | Admitting: Pharmacist

## 2024-12-15 ENCOUNTER — Other Ambulatory Visit: Payer: Self-pay

## 2024-12-18 ENCOUNTER — Other Ambulatory Visit (HOSPITAL_COMMUNITY): Payer: Self-pay

## 2024-12-18 ENCOUNTER — Other Ambulatory Visit: Payer: Self-pay

## 2024-12-19 ENCOUNTER — Other Ambulatory Visit (HOSPITAL_COMMUNITY): Payer: Self-pay

## 2024-12-20 ENCOUNTER — Other Ambulatory Visit (HOSPITAL_COMMUNITY): Payer: Self-pay

## 2024-12-20 MED ORDER — CYCLOBENZAPRINE HCL 5 MG PO TABS
5.0000 mg | ORAL_TABLET | Freq: Three times a day (TID) | ORAL | 0 refills | Status: AC | PRN
  Filled 2024-12-20 – 2024-12-26 (×2): qty 30, 10d supply, fill #0

## 2024-12-25 ENCOUNTER — Other Ambulatory Visit: Payer: Self-pay

## 2024-12-25 ENCOUNTER — Other Ambulatory Visit (HOSPITAL_COMMUNITY): Payer: Self-pay

## 2024-12-25 MED ORDER — OXYCODONE-ACETAMINOPHEN 10-325 MG PO TABS
1.0000 | ORAL_TABLET | Freq: Four times a day (QID) | ORAL | 0 refills | Status: AC | PRN
  Filled 2024-12-25: qty 120, 30d supply, fill #0

## 2024-12-26 ENCOUNTER — Other Ambulatory Visit: Payer: Self-pay

## 2024-12-27 ENCOUNTER — Other Ambulatory Visit: Payer: Self-pay

## 2025-01-01 ENCOUNTER — Other Ambulatory Visit: Payer: Self-pay

## 2025-01-05 ENCOUNTER — Other Ambulatory Visit: Payer: Self-pay

## 2025-01-05 ENCOUNTER — Other Ambulatory Visit (HOSPITAL_COMMUNITY): Payer: Self-pay

## 2025-01-05 MED ORDER — CYCLOBENZAPRINE HCL 10 MG PO TABS
10.0000 mg | ORAL_TABLET | Freq: Three times a day (TID) | ORAL | 1 refills | Status: AC
  Filled 2025-01-05: qty 90, 30d supply, fill #0
